# Patient Record
Sex: Male | Born: 1980 | Race: Black or African American | Hispanic: No | Marital: Married | Smoking: Never smoker
Health system: Southern US, Community
[De-identification: ages and names within clinical notes are randomized; demographics above are authoritative.]

## PROBLEM LIST (undated history)

## (undated) DIAGNOSIS — F32A Depression, unspecified: Secondary | ICD-10-CM

## (undated) DIAGNOSIS — Z91148 Patient's other noncompliance with medication regimen for other reason: Secondary | ICD-10-CM

## (undated) DIAGNOSIS — G4730 Sleep apnea, unspecified: Secondary | ICD-10-CM

## (undated) DIAGNOSIS — K219 Gastro-esophageal reflux disease without esophagitis: Secondary | ICD-10-CM

## (undated) DIAGNOSIS — E669 Obesity, unspecified: Secondary | ICD-10-CM

## (undated) DIAGNOSIS — I10 Essential (primary) hypertension: Secondary | ICD-10-CM

## (undated) DIAGNOSIS — R002 Palpitations: Secondary | ICD-10-CM

## (undated) DIAGNOSIS — I519 Heart disease, unspecified: Secondary | ICD-10-CM

## (undated) DIAGNOSIS — I428 Other cardiomyopathies: Secondary | ICD-10-CM

## (undated) DIAGNOSIS — N289 Disorder of kidney and ureter, unspecified: Secondary | ICD-10-CM

## (undated) DIAGNOSIS — I119 Hypertensive heart disease without heart failure: Secondary | ICD-10-CM

## (undated) DIAGNOSIS — K5900 Constipation, unspecified: Secondary | ICD-10-CM

## (undated) DIAGNOSIS — I5032 Chronic diastolic (congestive) heart failure: Secondary | ICD-10-CM

## (undated) DIAGNOSIS — R0602 Shortness of breath: Secondary | ICD-10-CM

## (undated) DIAGNOSIS — Z9114 Patient's other noncompliance with medication regimen: Secondary | ICD-10-CM

## (undated) DIAGNOSIS — F419 Anxiety disorder, unspecified: Secondary | ICD-10-CM

## (undated) DIAGNOSIS — I639 Cerebral infarction, unspecified: Secondary | ICD-10-CM

## (undated) DIAGNOSIS — R079 Chest pain, unspecified: Secondary | ICD-10-CM

## (undated) DIAGNOSIS — N183 Chronic kidney disease, stage 3 unspecified: Secondary | ICD-10-CM

## (undated) DIAGNOSIS — E785 Hyperlipidemia, unspecified: Secondary | ICD-10-CM

## (undated) DIAGNOSIS — E739 Lactose intolerance, unspecified: Secondary | ICD-10-CM

## (undated) HISTORY — DX: Chronic kidney disease, stage 3 unspecified: N18.30

## (undated) HISTORY — DX: Cerebral infarction, unspecified: I63.9

## (undated) HISTORY — DX: Other cardiomyopathies: I42.8

## (undated) HISTORY — DX: Gastro-esophageal reflux disease without esophagitis: K21.9

## (undated) HISTORY — PX: NO PAST SURGERIES: SHX2092

## (undated) HISTORY — DX: Depression, unspecified: F32.A

## (undated) HISTORY — DX: Chest pain, unspecified: R07.9

## (undated) HISTORY — DX: Anxiety disorder, unspecified: F41.9

## (undated) HISTORY — DX: Lactose intolerance, unspecified: E73.9

## (undated) HISTORY — DX: Constipation, unspecified: K59.00

## (undated) HISTORY — DX: Sleep apnea, unspecified: G47.30

## (undated) HISTORY — DX: Palpitations: R00.2

## (undated) HISTORY — DX: Shortness of breath: R06.02

## (undated) HISTORY — DX: Hyperlipidemia, unspecified: E78.5

---

## 2014-06-11 ENCOUNTER — Encounter: Payer: Self-pay | Admitting: Internal Medicine

## 2014-06-11 ENCOUNTER — Inpatient Hospital Stay
Admission: EM | Admit: 2014-06-11 | DRG: 305 | Disposition: A | Discharge status: Home / Self Care | Payer: BLUE CROSS/BLUE SHIELD | Attending: Internal Medicine | Admitting: Internal Medicine

## 2014-06-11 DIAGNOSIS — Z8249 Family history of ischemic heart disease and other diseases of the circulatory system: Secondary | ICD-10-CM

## 2014-06-11 DIAGNOSIS — I10 Essential (primary) hypertension: Principal | ICD-10-CM | POA: Diagnosis present

## 2014-06-11 DIAGNOSIS — E785 Hyperlipidemia, unspecified: Secondary | ICD-10-CM | POA: Diagnosis present

## 2014-06-11 DIAGNOSIS — R51 Headache: Secondary | ICD-10-CM | POA: Diagnosis present

## 2014-06-11 DIAGNOSIS — E119 Type 2 diabetes mellitus without complications: Secondary | ICD-10-CM | POA: Diagnosis present

## 2014-06-11 DIAGNOSIS — N179 Acute kidney failure, unspecified: Secondary | ICD-10-CM | POA: Diagnosis present

## 2014-06-11 DIAGNOSIS — G4733 Obstructive sleep apnea (adult) (pediatric): Secondary | ICD-10-CM | POA: Diagnosis present

## 2014-06-11 HISTORY — DX: Essential (primary) hypertension: I10

## 2014-06-11 LAB — COMPREHENSIVE METABOLIC PANEL
Albumin: 3.8 g/dL
Alkaline Phosphatase: 67 U/L
Anion Gap: 8 (ref 7–16)
BUN: 24 mg/dL — ABNORMAL HIGH
Bilirubin,Total: 0.4 mg/dL
Calcium, Total: 9 mg/dL
Chloride: 104 mmol/L
Co2: 25 mmol/L
Creatinine: 1.48 mg/dL — ABNORMAL HIGH
EGFR (African American): >60
EGFR (Non-African Amer.): >60
Glucose: 143 mg/dL — ABNORMAL HIGH
Potassium: 3.4 mmol/L — ABNORMAL LOW
SGOT(AST): 21 U/L
SGPT (ALT): 23 U/L
Sodium: 137 mmol/L
Total Protein: 7.6 g/dL

## 2014-06-11 LAB — LIPID PANEL
Cholesterol: 361 mg/dL — ABNORMAL HIGH
HDL Cholesterol: 41 mg/dL
Ldl Cholesterol, Calc: 271 mg/dL — ABNORMAL HIGH
Triglycerides: 246 mg/dL — ABNORMAL HIGH
VLDL Cholesterol, Calc: 49 mg/dL — ABNORMAL HIGH

## 2014-06-11 LAB — DRUG SCREEN, URINE
Amphetamines, Ur Screen: NEGATIVE
Barbiturates, Ur Screen: NEGATIVE
Benzodiazepine, Ur Scrn: NEGATIVE
Cannabinoid 50 Ng, Ur ~~LOC~~: NEGATIVE
Cocaine Metabolite,Ur ~~LOC~~: NEGATIVE
MDMA (Ecstasy)Ur Screen: NEGATIVE
Methadone, Ur Screen: NEGATIVE
Opiate, Ur Screen: NEGATIVE
Phencyclidine (PCP) Ur S: NEGATIVE
Tricyclic, Ur Screen: NEGATIVE

## 2014-06-11 LAB — CBC
HCT: 42 % (ref 40.0–52.0)
HGB: 14.1 g/dL (ref 13.0–18.0)
MCH: 26.7 pg (ref 26.0–34.0)
MCHC: 33.5 g/dL (ref 32.0–36.0)
MCV: 80 fL (ref 80–100)
Platelet: 326 10*3/uL (ref 150–440)
RBC: 5.27 10*6/uL (ref 4.40–5.90)
RDW: 14.9 % — ABNORMAL HIGH (ref 11.5–14.5)
WBC: 9.1 10*3/uL (ref 3.8–10.6)

## 2014-06-11 LAB — PRO B NATRIURETIC PEPTIDE: B-Type Natriuretic Peptide: 138 pg/mL — ABNORMAL HIGH

## 2014-06-11 LAB — CK TOTAL AND CKMB (NOT AT ARMC)
CK, Total: 134 U/L
CK, Total: 109 U/L
CK, Total: 102 U/L
CK-MB: 3.5 ng/mL
CK-MB: 3.2 ng/mL
CK-MB: 3.2 ng/mL

## 2014-06-11 LAB — TROPONIN I
Troponin-I: 0.04 ng/mL — ABNORMAL HIGH
Troponin-I: 0.06 ng/mL — ABNORMAL HIGH
Troponin-I: 0.05 ng/mL — ABNORMAL HIGH

## 2014-06-11 IMAGING — CR DG CHEST 1V PORT
1 series · 1 of 1 positions shown · non-contrast
Comparison: None.

CLINICAL DATA: Shortness of breath and hypertension

EXAM:
PORTABLE CHEST - 1 VIEW

[ap]
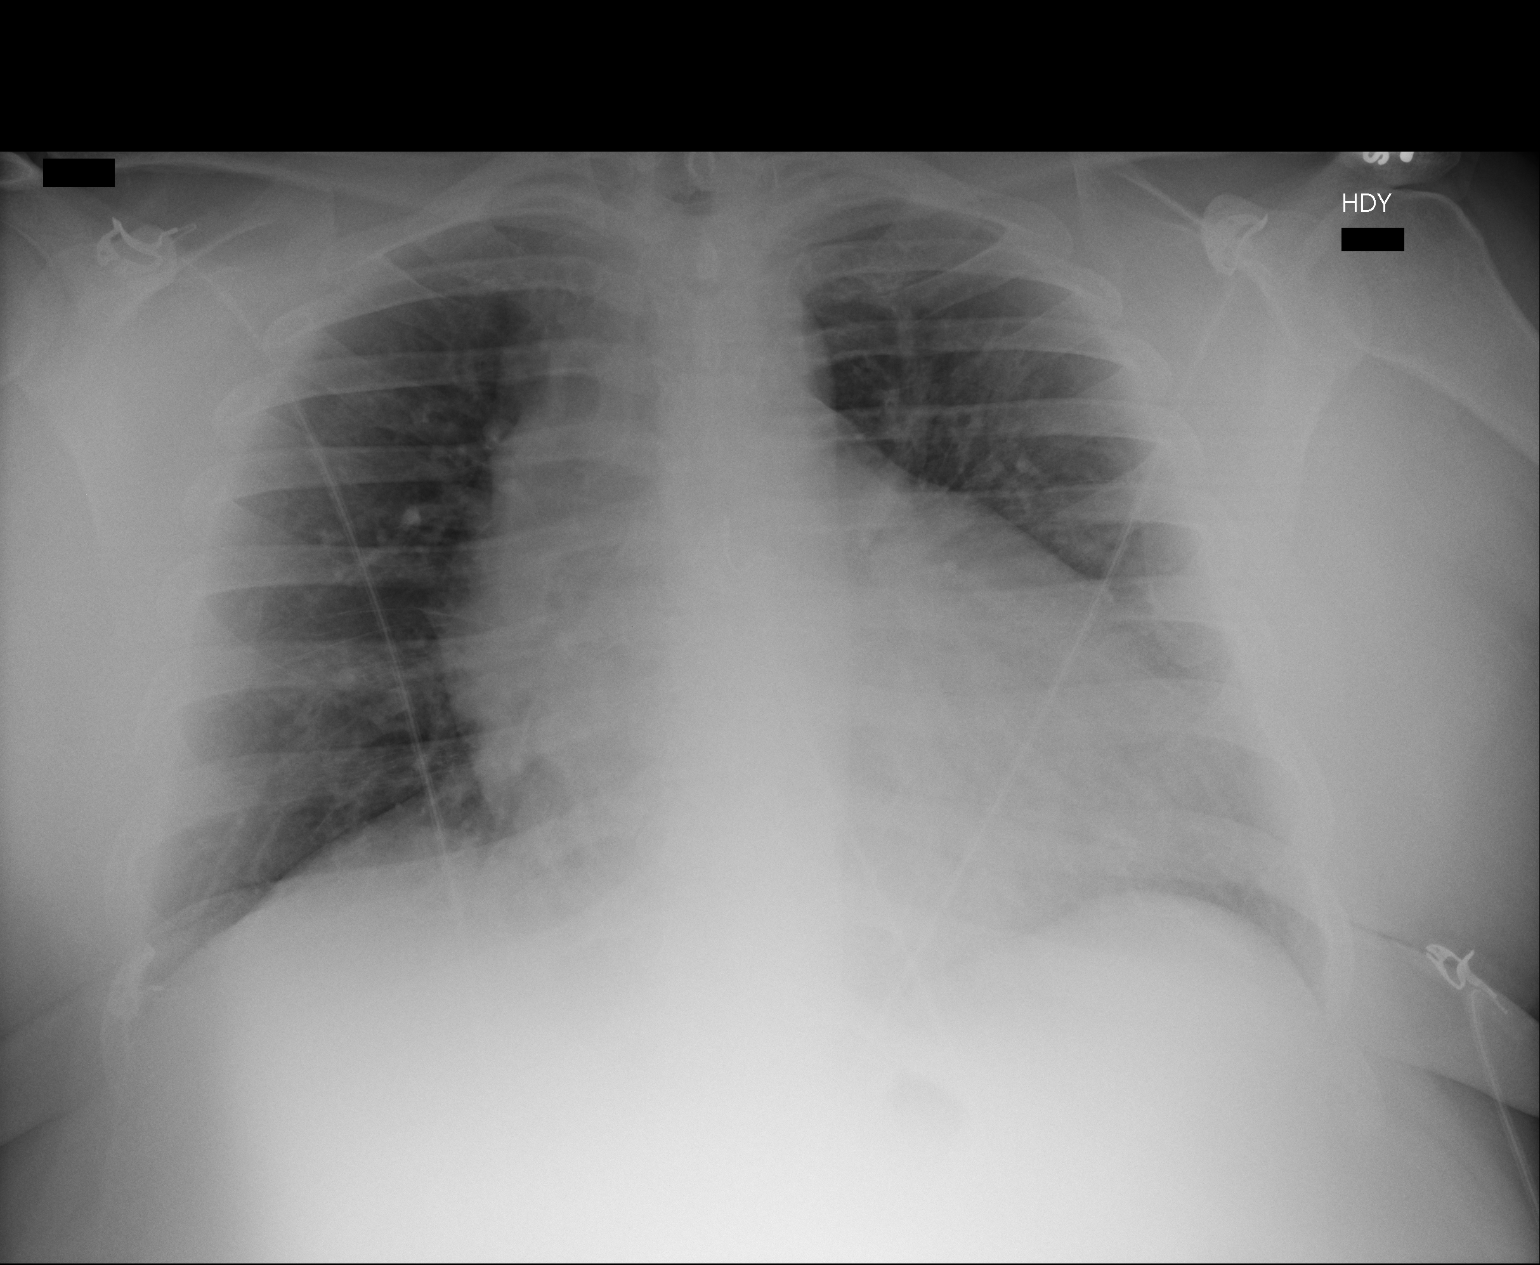

[1 of 1 positions shown; findings below may reference images not displayed]

FINDINGS: The lungs are clear. The heart is enlarged with pulmonary
vascularity within normal limits. No adenopathy appreciable. No bone
lesions.
IMPRESSION: Cardiomegaly.  No edema or consolidation.

## 2014-06-11 IMAGING — CT CT HEAD WITHOUT CONTRAST
1 of 2 series · 13 of 30 positions shown, 17 images · non-contrast
Comparison: None.

CLINICAL DATA: Progressive vision loss right eye. Hypertension.
Headaches.

EXAM:
CT HEAD WITHOUT CONTRAST
TECHNIQUE: Contiguous axial images were obtained from the base of the skull
through the vertex without intravenous contrast.

[Series 2: head wo · axial · 0.40mm/px · z∈[+1052,+1191]mm · 13 of 36 slices shown, 17 images]
[im 3/36  brain]
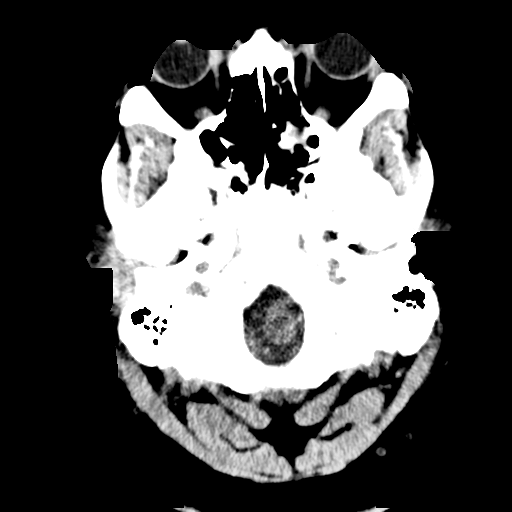
[im 3/36  bone]
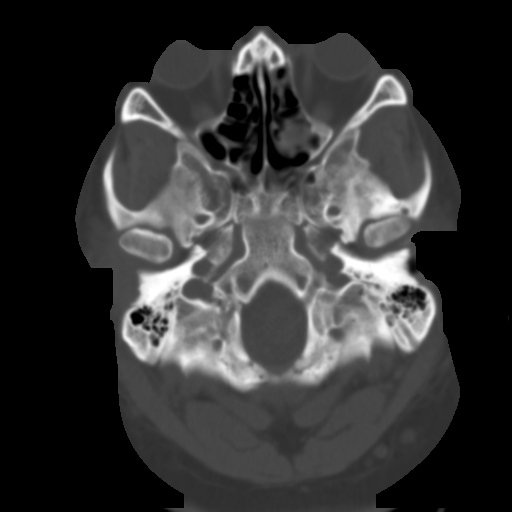
[im 6/36  brain]
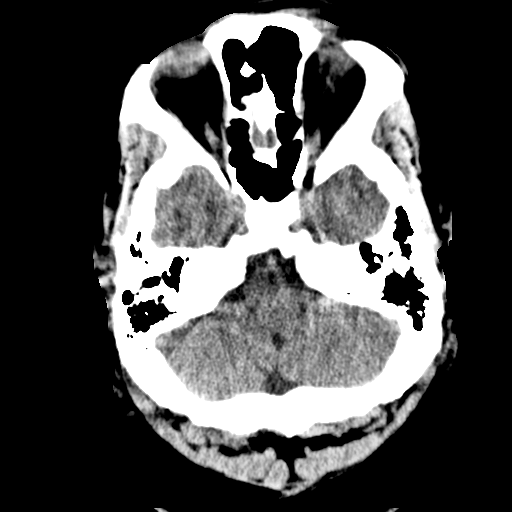
[im 8/36  brain]
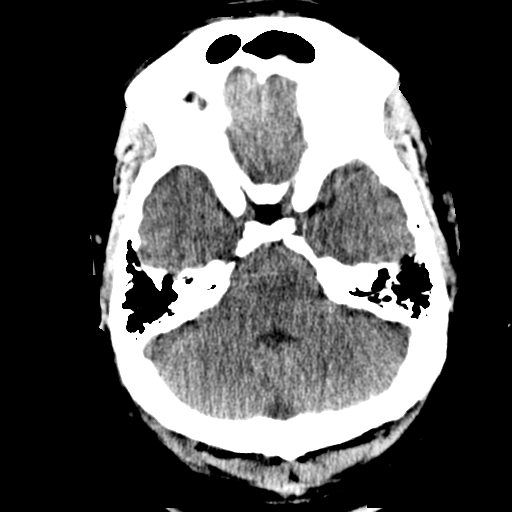
[im 11/36  brain]
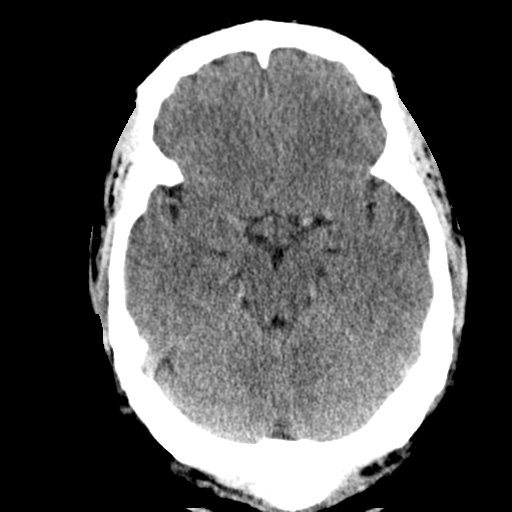
[im 13/36  brain]
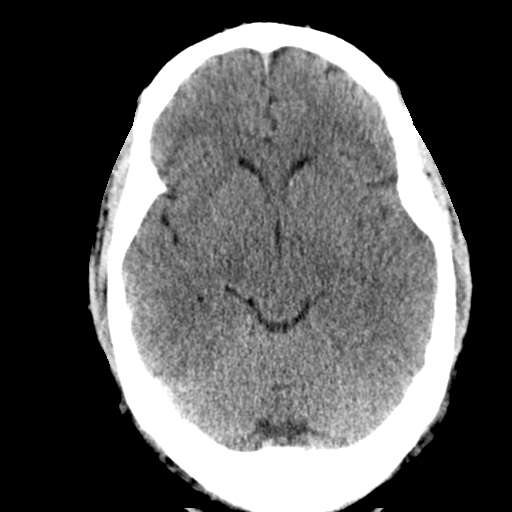
[im 13/36  bone]
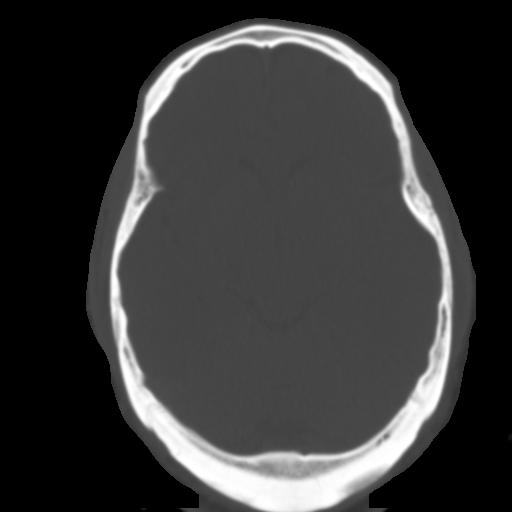
[im 16/36  brain]
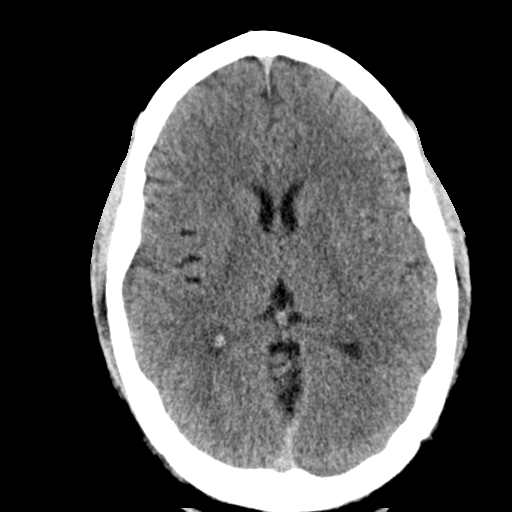
[im 18/36  brain]
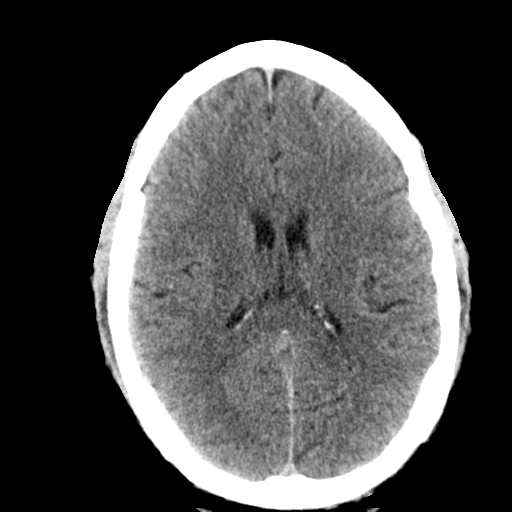
[im 21/36  brain]
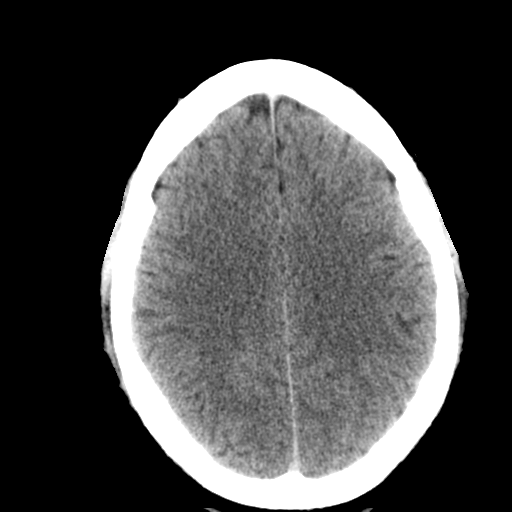
[im 23/36  brain]
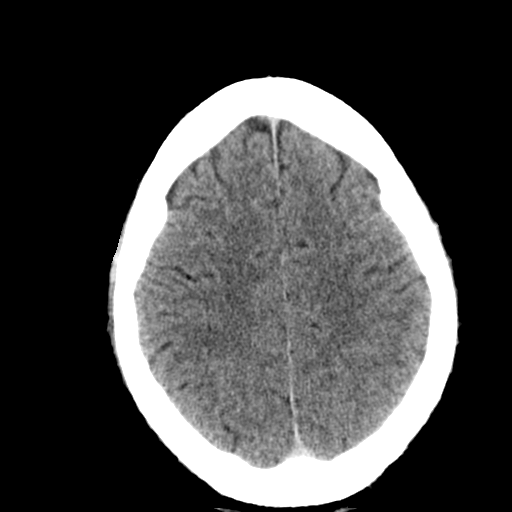
[im 23/36  bone]
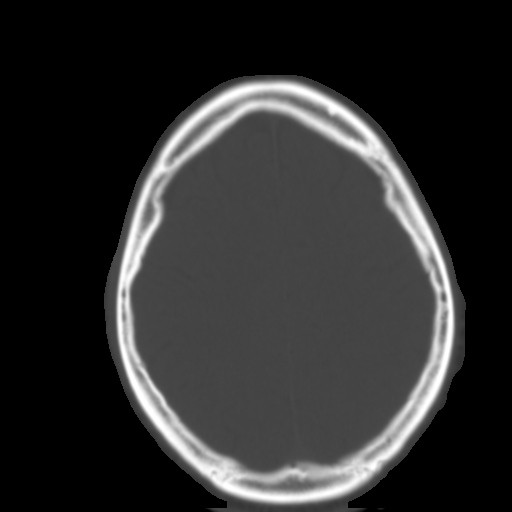
[im 26/36  brain]
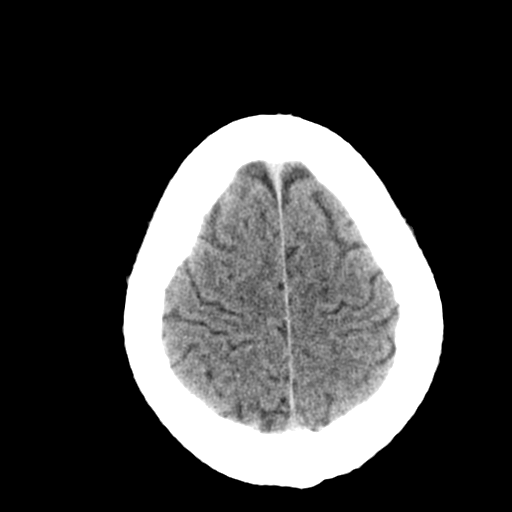
[im 28/36  brain]
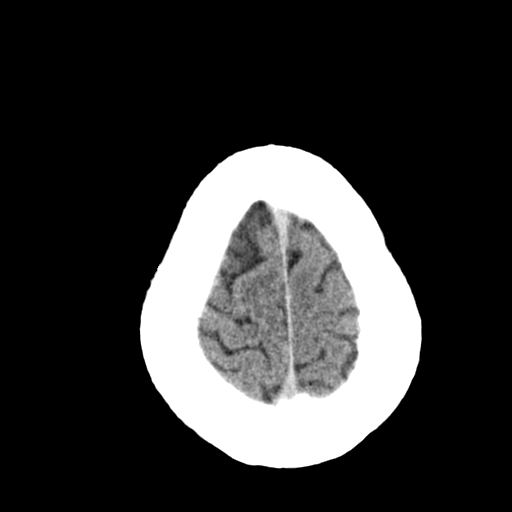
[im 31/36  brain]
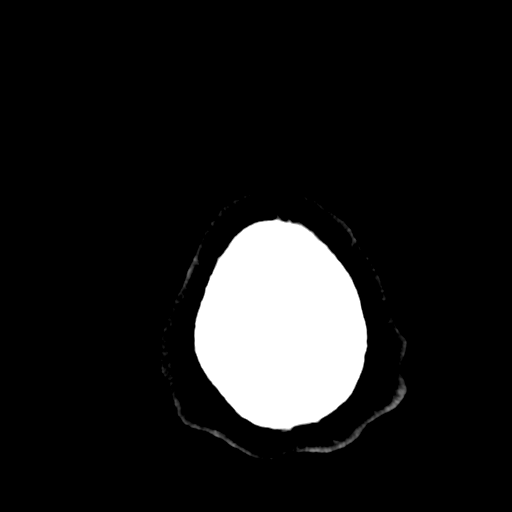
[im 33/36  brain]
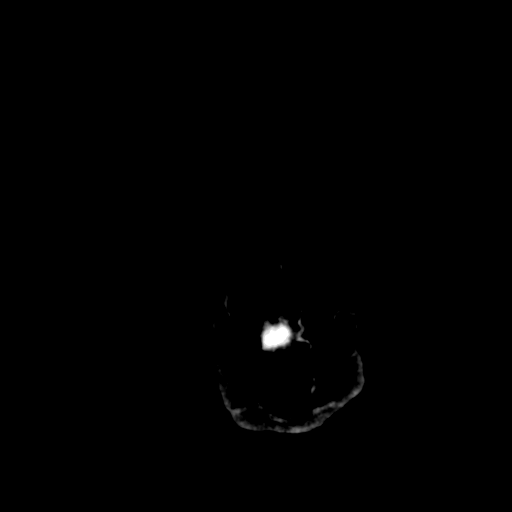
[im 33/36  bone]
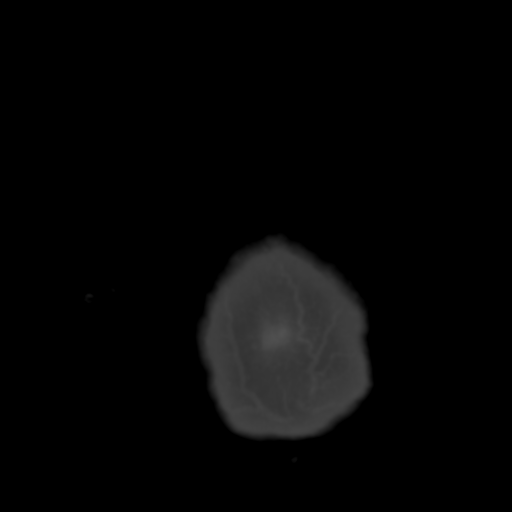

[13 of 30 positions shown; findings below may reference images not displayed]

FINDINGS: The ventricles are normal in size and configuration. There is no
intracranial mass, hemorrhage, extra-axial fluid collection, or
midline shift. Gray-white compartments appear normal. No acute
infarct apparent. Orbits appear symmetric and unremarkable
bilaterally. Bony calvarium appears intact. The mastoid air cells
are clear.
IMPRESSION: Study within normal limits.

## 2014-06-11 MED ORDER — HEPARIN SODIUM (PORCINE) 5000 UNIT/ML IJ SOLN
5000.0000 [IU] | Freq: Three times a day (TID) | INTRAMUSCULAR | Status: DC
  Administered 2014-06-12 – 2014-06-13 (×4): 5000 [IU] via SUBCUTANEOUS
  Filled 2014-06-11 (×3): qty 1

## 2014-06-11 MED ORDER — SODIUM CHLORIDE 0.9 % IJ SOLN
3.0000 mL | Freq: Four times a day (QID) | INTRAMUSCULAR | Status: DC
  Administered 2014-06-12 – 2014-06-13 (×5): 3 mL via INTRAVENOUS

## 2014-06-11 MED ORDER — HYDRALAZINE HCL 20 MG/ML IJ SOLN
10.0000 mg | Freq: Four times a day (QID) | INTRAMUSCULAR | Status: DC | PRN
  Administered 2014-06-12: 14:00:00 10 mg via INTRAVENOUS
  Filled 2014-06-11: qty 1

## 2014-06-11 MED ORDER — DOCUSATE SODIUM 100 MG PO CAPS: 100.0000 mg | ORAL_CAPSULE | Freq: Two times a day (BID) | ORAL | Status: DC | PRN

## 2014-06-11 MED ORDER — LISINOPRIL 20 MG PO TABS
20.0000 mg | ORAL_TABLET | Freq: Every day | ORAL | Status: DC
  Administered 2014-06-12: 20 mg via ORAL
  Filled 2014-06-11: qty 1

## 2014-06-11 MED ORDER — INSULIN ASPART 100 UNIT/ML ~~LOC~~ SOLN
0.0000 [IU] | Freq: Three times a day (TID) | SUBCUTANEOUS | Status: DC
  Administered 2014-06-12 – 2014-06-13 (×2): 2 [IU] via SUBCUTANEOUS
  Filled 2014-06-11 (×3): qty 2

## 2014-06-11 MED ORDER — NITROGLYCERIN IN D5W 200-5 MCG/ML-% IV SOLN
0.0000 ug/min | INTRAVENOUS | Status: DC
Start: 2014-06-12 — End: 2014-06-12
  Administered 2014-06-12: 5 ug/min via INTRAVENOUS

## 2014-06-11 MED ORDER — NITROGLYCERIN 2 % TD OINT: 1.0000 [in_us] | TOPICAL_OINTMENT | TRANSDERMAL | Status: DC

## 2014-06-11 MED ORDER — ASPIRIN 81 MG PO CHEW
81.0000 mg | CHEWABLE_TABLET | Freq: Every day | ORAL | Status: DC
  Administered 2014-06-12: 81 mg via ORAL
  Filled 2014-06-11: qty 1

## 2014-06-11 MED ORDER — HYDROCODONE-ACETAMINOPHEN 5-325 MG PO TABS
1.0000 | ORAL_TABLET | ORAL | Status: DC | PRN
  Administered 2014-06-12 (×2): 1 via ORAL
  Filled 2014-06-11 (×2): qty 1

## 2014-06-11 MED ORDER — SENNA 8.6 MG PO TABS: 1.0000 | ORAL_TABLET | Freq: Two times a day (BID) | ORAL | Status: DC | PRN

## 2014-06-11 MED ORDER — METOPROLOL TARTRATE 50 MG PO TABS
50.0000 mg | ORAL_TABLET | Freq: Two times a day (BID) | ORAL | Status: DC
  Administered 2014-06-12 (×2): 50 mg via ORAL
  Filled 2014-06-11 (×2): qty 1

## 2014-06-11 MED ORDER — ACETAMINOPHEN 325 MG PO TABS
650.0000 mg | ORAL_TABLET | ORAL | Status: DC | PRN
  Administered 2014-06-12: 17:00:00 650 mg via ORAL
  Filled 2014-06-11: qty 2

## 2014-06-11 MED ORDER — GLIPIZIDE 10 MG PO TABS
10.0000 mg | ORAL_TABLET | Freq: Every day | ORAL | Status: DC
  Administered 2014-06-12 – 2014-06-13 (×2): 10 mg via ORAL
  Filled 2014-06-11: qty 2
  Filled 2014-06-11: qty 1

## 2014-06-11 MED ORDER — HYDROCHLOROTHIAZIDE 12.5 MG PO CAPS
12.5000 mg | ORAL_CAPSULE | Freq: Every day | ORAL | Status: DC
  Administered 2014-06-12: 12.5 mg via ORAL
  Filled 2014-06-11: qty 1

## 2014-06-11 MED ORDER — SODIUM CHLORIDE 0.9 % IJ SOLN
3.0000 mL | INTRAMUSCULAR | Status: DC | PRN
Start: 2014-06-12 — End: 2014-06-13
  Administered 2014-06-12: 6 mL via INTRAVENOUS
  Filled 2014-06-11: qty 10

## 2014-06-11 MED ORDER — ONDANSETRON HCL 4 MG/2ML IJ SOLN: 4.0000 mg | INTRAMUSCULAR | Status: DC | PRN

## 2014-06-12 ENCOUNTER — Encounter: Payer: Self-pay | Admitting: Internal Medicine

## 2014-06-12 ENCOUNTER — Other Ambulatory Visit: Payer: Self-pay | Admitting: Cardiology

## 2014-06-12 ENCOUNTER — Inpatient Hospital Stay: Payer: BLUE CROSS/BLUE SHIELD

## 2014-06-12 LAB — BASIC METABOLIC PANEL
Anion gap: 10 (ref 5–15)
BUN: 23 mg/dL — ABNORMAL HIGH (ref 6–20)
CO2: 26 mmol/L (ref 22–32)
Calcium: 8.9 mg/dL (ref 8.9–10.3)
Chloride: 100 mmol/L — ABNORMAL LOW (ref 101–111)
Creatinine, Ser: 1.29 mg/dL — ABNORMAL HIGH (ref 0.61–1.24)
GFR calc Af Amer: >60 mL/min (ref >60)
GFR calc non Af Amer: >60 mL/min (ref >60)
Glucose, Bld: 161 mg/dL — ABNORMAL HIGH (ref 65–99)
Potassium: 3.2 mmol/L — ABNORMAL LOW (ref 3.5–5.1)
Sodium: 136 mmol/L (ref 135–145)

## 2014-06-12 LAB — GLUCOSE, CAPILLARY
Glucose-Capillary: 160 mg/dL — ABNORMAL HIGH (ref 70–99)
Glucose-Capillary: 113 mg/dL — ABNORMAL HIGH (ref 70–99)
Glucose-Capillary: 131 mg/dL — ABNORMAL HIGH (ref 70–99)
Glucose-Capillary: 147 mg/dL — ABNORMAL HIGH (ref 70–99)
Glucose-Capillary: 125 mg/dL — ABNORMAL HIGH (ref 70–99)

## 2014-06-12 MED ORDER — PNEUMOCOCCAL 13-VAL CONJ VACC IM SUSP
0.5000 mL | INTRAMUSCULAR | Status: DC
  Filled 2014-06-12 (×2): qty 0.5

## 2014-06-12 MED ORDER — POTASSIUM CHLORIDE CRYS ER 20 MEQ PO TBCR
40.0000 meq | EXTENDED_RELEASE_TABLET | Freq: Once | ORAL | Status: AC
  Administered 2014-06-12: 40 meq via ORAL
  Filled 2014-06-12: qty 2

## 2014-06-12 NOTE — Plan of Care (Signed)
Problem: Discharge Progression Outcomes Goal: Discharge plan in place and appropriate Individualization- Pt took himself off BP meds Goal: Other Discharge Outcomes/Goals Outcome: Progressing Pt required hydralazine IV x 1 for DBP 101 which was effective. Tylenol effective for mild HA x 1. No complications this shift. Tolerating 1800 diet well; providing additional diet beverages. Up in room ad lib; tolerated well.

## 2014-06-12 NOTE — Progress Notes (Signed)
Pt rested comfortably throughout night, still has HA from NTG drip. Pt was unable to maintain dystolic most of night below 90 as per order. No other issues noted this shift.

## 2014-06-12 NOTE — Progress Notes (Signed)
BMP results reviewed. Return TC page from Dr. Allena Katz advised K+ 3.2 with new orders obtained.

## 2014-06-12 NOTE — Progress Notes (Signed)
All documentation overdue was done in [facility]  Mr [id number]

## 2014-06-12 NOTE — Progress Notes (Signed)
Pt transferred to 1C, rm 116, report called to [employer].  All belongings sent with pt.  No c/o pain or SOB.  Nitro drip stopped at beginning of shift.  BP has remained stable since

## 2014-06-12 NOTE — Progress Notes (Signed)
Pt transferred from CCU. Denies co's/concerns. Required Hydralazine IV x 1 for elevated BP with desired response. Compliant with diabetic diet. FSBS's controlled.

## 2014-06-12 NOTE — Progress Notes (Signed)
Subjective: 34 yo admitted for HTN emergency. Symptoms better today. Blurred vision in right eye improved but has been going on for 2 wks. No chest pain or SOB.  Objective: Vital signs in last 24 hours: Temp:  [98.5 F (36.9 C)] 98.5 F (36.9 C) (05/01 0700) Pulse Rate:  [100] 100 (05/01 0800) Resp:  [18-21] 21 (05/01 0800) BP: (130-141)/(83-86) 130/83 mmHg (05/01 0800) SpO2:  [95 %] 95 % (05/01 0700) Weight:  [130.636 kg (288 lb)] 130.636 kg (288 lb) (04/30 1755) Weight change:     Intake/Output from previous day: 04/30 0701 - 05/01 0700 In: -  Out: 700 [Urine:700] Intake/Output this shift:    General appearance: alert and no distress Head: Normocephalic, without obvious abnormality Eyes: conjunctivae/corneas clear. PERRL, EOM's intact. Fundi benign. Throat: lips, mucosa, and tongue normal; teeth and gums normal Resp: clear to auscultation bilaterally and normal percussion bilaterally Cardio: regular rate and rhythm, S1, S2 normal, no murmur, click, rub or gallop GI: soft, non-tender; bowel sounds normal; no masses,  no organomegaly Pulses: 2+ and symmetric Skin: Skin color, texture, turgor normal. No rashes or lesions Neurologic: Cranial nerves: 2-12 grossly intact Sensory: normal, No sensory def bilaterally Motor: No weakness bilatterally  Lab Results:  Recent Labs  06/11/14 1323  WBC 9.1  HGB 14.1  HCT 42.0  PLT 326   BMET  Recent Labs  06/11/14 1323  CO2 25  BUN 24*  CREATININE 1.48*  CALCIUM 9.0    Studies/Results: Dg Chest Port 1 View  06/11/2014   CLINICAL DATA:  Shortness of breath and hypertension  EXAM: PORTABLE CHEST - 1 VIEW  COMPARISON:  None.  FINDINGS: The lungs are clear. The heart is enlarged with pulmonary vascularity within normal limits. No adenopathy appreciable. No bone lesions.  IMPRESSION: Cardiomegaly.  No edema or consolidation.   Electronically Signed   By: Bretta Bang III M.D.   On: 06/11/2014 15:31   Ct Head Limited W/o  Cm  06/11/2014   CLINICAL DATA:  Progressive vision loss right eye. Hypertension. Headaches.  EXAM: CT HEAD WITHOUT CONTRAST  TECHNIQUE: Contiguous axial images were obtained from the base of the skull through the vertex without intravenous contrast.  COMPARISON:  None.  FINDINGS: The ventricles are normal in size and configuration. There is no intracranial mass, hemorrhage, extra-axial fluid collection, or midline shift. Gray-white compartments appear normal. No acute infarct apparent. Orbits appear symmetric and unremarkable bilaterally. Bony calvarium appears intact. The mastoid air cells are clear.  IMPRESSION: Study within normal limits.   Electronically Signed   By: Bretta Bang III M.D.   On: 06/11/2014 14:29    Medications:  Scheduled: . aspirin  81 mg Oral Daily  . glipiZIDE  10 mg Oral QAC breakfast  . heparin subcutaneous  5,000 Units Subcutaneous 3 times per day  . hydrochlorothiazide  12.5 mg Oral Daily  . insulin aspart  0-10 Units Subcutaneous TID WC & HS  . lisinopril  20 mg Oral Daily  . metoprolol tartrate  50 mg Oral BID  . sodium chloride  3 mL Intravenous 4 times per day   Continuous:  MEQ:[id number], docusate sodium, hydrALAZINE, HYDROcodone-acetaminophen, ondansetron (ZOFRAN) IV, senna, sodium chloride  Assessment/Plan: 1. Hypertensive Emergency: BP much better. Symptoms improving. Will wean pt off nitro ggt and transfer out of ICU. Echo pending to asses for potential heart damage from uncontrolled HTN.  2. Acute Renal Failure: Morning Cr pending. Given IVF overnight to see if mild dehydration was contributing. If Cr not  trending down then will consult nephrology for further workup.   3. Type 2 DM: Started on Glipizide. Hgb-A1C pending. No metformin secondary to renal failure. Will need f/u as outpt.  4. Mildly Elevated Trop. Peaked at 0.06. No cardiac symptoms. Suspect sec to HTN. Echo pending.  5. Hyperlipidemia: Started on statin.  LOS: 1 day    Gracelyn Nurse 06/12/2014, 8:22 AM

## 2014-06-13 ENCOUNTER — Other Ambulatory Visit: Payer: Self-pay | Admitting: Internal Medicine

## 2014-06-13 DIAGNOSIS — I161 Hypertensive emergency: Secondary | ICD-10-CM | POA: Diagnosis present

## 2014-06-13 LAB — GLUCOSE, CAPILLARY: Glucose-Capillary: 191 mg/dL — ABNORMAL HIGH (ref 70–99)

## 2014-06-13 LAB — POTASSIUM: Potassium: 3.3 mmol/L — ABNORMAL LOW (ref 3.5–5.1)

## 2014-06-13 MED ORDER — LISINOPRIL 40 MG PO TABS: 40.0000 mg | ORAL_TABLET | Freq: Every day | ORAL | Status: DC

## 2014-06-13 MED ORDER — GLIPIZIDE 10 MG PO TABS: 10.0000 mg | ORAL_TABLET | Freq: Every day | ORAL | Status: DC

## 2014-06-13 MED ORDER — HYDROCHLOROTHIAZIDE 12.5 MG PO CAPS: 12.5000 mg | ORAL_CAPSULE | Freq: Every day | ORAL | Status: DC

## 2014-06-13 MED ORDER — METOPROLOL TARTRATE 50 MG PO TABS: 50.0000 mg | ORAL_TABLET | Freq: Two times a day (BID) | ORAL | Status: DC

## 2014-06-13 NOTE — H&P (Addendum)
PATIENT NAME:  Joel [name], Joel [name] MR#:  [id number] DATE OF BIRTH:  [birth year]  DATE OF ADMISSION:  06/11/2014  PRIMARY CARE PHYSICIAN: [facility] [place], [place]  CHIEF COMPLAINT: Elevated blood pressure, right eye blurred vision.   Joel [name] is a 34 year old African-American gentleman with past medical history of hypertension, diabetes, and hyperlipidemia who took himself off medication since he was feeling better and comes to the Emergency Room after he started having on and off chest tightness, chest soreness for 1-1/2 months. He started noticing some blurred vision in his right eye about 4 or 5 days ago, went to see an optometrist in [facility], who took his blood pressure. It was found to be very high. The patient was asked to make appointment with a primary care physician and follow up with ophthalmologist. Today at work, he was not feeling well and blood pressure was taken and his blood pressure was around 250/146. He came to the Emergency Room. His initial blood pressure was 246/143. He received 2 doses of IV labetalol and nitroglycerin paste. He is not having any chest pain at present, and his vision has mild blurriness on the right eye. He is being admitted for hypertensive urgency, for further evaluation and management.   PAST MEDICAL HISTORY:  1. Hypertension.  2. Hyperlipidemia.  3. Type 2 diabetes.  4. Obstructive sleep apnea, does not use CPAP.   FAMILY HISTORY: Positive for hypertension and heart disease.   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 Nonsmoker, nonalcoholic. Denies any other drug use.  MEDICATIONS: None. The patient used to be on hydrochlorothiazide, lisinopril and metformin along with Simvastatin for his chronic medical issues. However, he took himself off for about a year.  REVIEW OF SYSTEMS: .  CONSTITUTIONAL: No fever. No fatigue, weakness.  EYES: Positive for blurred vision in the right eye. No glaucoma or  cataract. ENT: No tinnitus, ear pain, hearing loss. RESPIRATORY: No cough, wheeze, hemoptysis, or dyspnea.  CARDIOVASCULAR: Positive for chest tightness on and off. Positive for hypertension. No dyspnea on exertion or palpitations.  GASTROINTESTINAL: No nausea, vomiting, diarrhea abdominal pain or GERD.  GENITOURINARY: No dysuria, hematuria, or frequency.  ENDOCRINE: No polyuria, nocturia or thyroid problems.  HEMATOLOGY: No anemia, easy bruising or bleeding.  SKIN: No acne, rash, or lesion.  MUSCULOSKELETAL: No arthritis, cramps, swelling or gout.  NEUROLOGIC: No CVA, TIA, dementia or seizures.  PSYCHIATRIC: No anxiety, depression, or bipolar disorder. All other systems reviewed and negative.   PHYSICAL EXAMINATION:  GENERAL: The patient is awake, alert, oriented x3, not in acute distress. His current blood pressure is 198/146. Saturations are 97% on room air. Pulse is 112. Temperature is 98.4.  HEENT: Atraumatic, normocephalic. Pupils PERRLA. EOM intact. Oral mucosa is moist. NECK: Supple. No JVD. No carotid bruit.  RESPIRATORY: Clear to auscultation bilaterally.  HEART: Both rales, rhonchi, respiratory distress or labored breathing.  CARDIOVASCULAR: Both the heard sounds are normal. Rate and rhythm regular. PMI not lateralized. Chest is nontender.  EXTREMITIES: Good pedal pulses, good femoral pulses. No lower extremity edema.  ABDOMEN: Soft, benign, nontender. No organomegaly. Positive bowel sounds.  NEUROLOGIC: Grossly intact cranial nerves II through XII. No motor or sensory deficits.  PSYCHIATRIC: The patient is awake, alert, oriented x3.  SKIN: Warm and dry.  EKG shows normal sinus rhythm with possible left atrial enlargement, possible T-wave abnormality, consider lateral ischemia.  CT of the head shows no evidence of CVA.   Chest x-ray  shows cardiomegaly, no edema or consultation.  CK total, CK-MB within normal limits. Troponin is 0.04. Glucose is 143. BUN 24, creatinine is  1.48, sodium is 137, potassium is 3.4. CBC within normal limits. B-type natriuretic peptide is 138.   ASSESSMENT AND PLAN: A 34 year old, Joel [name], with history of hypertension, diabetes, hyperlipidemia, not on any medication, comes in with:  1. Hypertensive urgency with blurred vision and shortness of breath. The patient's blood pressure is improving slowly with treatment in the Emergency Room with labetalol and nitroglycerin. Will start the patient on metoprolol, hydrochlorothiazide and lisinopril, p.r.n. hydralazine for elevated blood pressure. We will check echocardiogram, lipid profile, cycle cardiac enzymes x3. Continue aspirin, nitroglycerin paste and beta blockers.  2. Type 2 diabetes. Sliding scale insulin and start glipizide XL 10 mg b.i.d. Metformin will not be started since creatinine is high.  3. Acute renal failure, appears likely underlying some chronic component. I do not have any baseline creatinine. The patient does not appear dehydrated. He has already received some IV fluids. Will continue to monitor Is and Os, avoid nephrotoxins. He is started on lisinopril for his blood pressure. Some elevated creatinine is going to be expected.  4. Hyperlipidemia. Check lipid profile. Start him on statins. He used to be on Simvastatin before. 5. Obesity with history of sleep apnea. The patient is recommended to, once he sees his primary care physician, try to get outpatient sleep study and CPAP set up at home.  6. Deep vein thrombosis prophylaxis. Subcutaneous heparin t.i.d.  7. Dietitian consult placed for instructions on diet regarding hypertension and diabetes along with hyperlipidemia. 8. The above was discussed with the patient, the patient's mother and wife who are present in the Emergency Room.  CRITICAL TIME SPENT: 55 minutes.    ____________________________ Wylie Hail Allena Katz, MD sap:jh D: 06/11/2014 15:52:39 ET T: 06/11/2014 16:17:58 ET JOB#: [id number]  cc: Laquonda Welby A. Allena Katz, MD,  <Dictator> [facility] [place] Willow Ora MD ELECTRONICALLY SIGNED 06/14/2014 11:21

## 2014-06-13 NOTE — Discharge Summary (Signed)
Physician Discharge Summary  Patient ID: REVIS BOTZ MRN: [id number] DOB/AGE: [birth year] 32 y.o.  Admit date: 06/11/2014 Discharge date: 06/13/2014  Admission Diagnoses: Hypertensive Emergency  Discharge Diagnoses:  Active Problems:   Hypertensive urgency   Discharged Condition: stable  Hospital Course: 1. HTN Urgency: Pt admitted with extremely elevated blood pressure with systolic in the 240's. Given IV labetolol and started on nitro drip. Started orally on B-block ACE and diuretic. Weaned off drips and tolerating oral meds at discharge. Will follow up with PCP for further medication adjustment.  2. Acute Renal Failure: CR 1.48. Trended down to 1.25 with fluids. With hx of HTN would recommend follow up with nephrology as out pt.   3. DM: Stopped metformin sec to renal issues. Started glipizide, will need adjustment as outpt.  4. Mild Elevated Trop: Echo shows EF of 45-50%. No further workup. On ACE and B-blocker.  Consults: None  Significant Diagnostic Studies: echo    Discharge Exam: Blood pressure 137/85, pulse 86, temperature 97.5 F (36.4 C), temperature source Oral, resp. rate 18, height 5' 7.99" (1.727 m), weight 130.636 kg (288 lb), SpO2 98 %. Cardio: regular rate and rhythm, S1, S2 normal, no murmur, click, rub or gallop  Disposition: Final discharge disposition not confirmed  Discharge Instructions    Activity as tolerated - No restrictions    Complete by:  As directed      Diet - low sodium heart healthy    Complete by:  As directed             Medication List    TAKE these medications        glipiZIDE 10 MG tablet  Commonly known as:  GLUCOTROL  Take 1 tablet (10 mg total) by mouth daily before breakfast.     hydrochlorothiazide 12.5 MG capsule  Commonly known as:  MICROZIDE  Take 1 capsule (12.5 mg total) by mouth daily.     lisinopril 40 MG tablet  Commonly known as:  PRINIVIL,ZESTRIL  Take 1 tablet (40 mg total) by mouth daily.     metoprolol 50 MG tablet  Commonly known as:  LOPRESSOR  Take 1 tablet (50 mg total) by mouth 2 (two) times daily.           Follow-up Information    Follow up with [facility] In 1 day.   Specialty:  Family Medicine   Contact information:   [address] [place] [address] [phone]       Signed: Gracelyn Nurse 06/13/2014, 9:09 AM

## 2014-06-13 NOTE — Care Management Note (Signed)
Case Management Note  Patient Details  Name: Joel [name] MRN: [id number] Date of Birth: 1981-02-09  Subjective/Objective:                   Admitted to [facility] with hypertension.  Lives with Nasario Masek [phone]) No home health. Takes care of all activities of daily living himself. Friends will transport. Action/Plan:  Follow-up appointment arranged for 06/14/14 at [facility] in [facility] Expected Discharge Date:                  Expected Discharge Plan:     In-House Referral:     Discharge planning Services     Post Acute Care Choice:    Choice offered to:     DME Arranged:    DME Agency:     HH Arranged:    HH Agency:     Status of Service:     Medicare Important Message Given:    Date Medicare IM Given:    Medicare IM give by:    Date Additional Medicare IM Given:    Additional Medicare Important Message give by:     If discussed at Long Length of Stay Meetings, dates discussed:    Additional Comments:  Gwenette Greet 06/13/2014, 10:08 AM

## 2014-06-13 NOTE — Plan of Care (Signed)
Problem: Discharge Progression Outcomes Goal: Other Discharge Outcomes/Goals Outcome: Progressing Plan of care progress to goal for:  Pain-prn meds given with improvement Hemodynamically-pt had high BP with improvement with meds Complications-pt complained of headache, prn meds given with improvement

## 2014-10-11 ENCOUNTER — Emergency Department: Payer: BLUE CROSS/BLUE SHIELD

## 2014-10-11 ENCOUNTER — Encounter: Payer: Self-pay | Admitting: Emergency Medicine

## 2014-10-11 ENCOUNTER — Inpatient Hospital Stay
Admission: EM | Admit: 2014-10-11 | Discharge: 2014-10-13 | DRG: 292 | Disposition: A | Discharge status: Home / Self Care | Payer: BLUE CROSS/BLUE SHIELD | Attending: Internal Medicine | Admitting: Internal Medicine

## 2014-10-11 DIAGNOSIS — I509 Heart failure, unspecified: Secondary | ICD-10-CM

## 2014-10-11 DIAGNOSIS — R0602 Shortness of breath: Secondary | ICD-10-CM | POA: Diagnosis present

## 2014-10-11 DIAGNOSIS — I129 Hypertensive chronic kidney disease with stage 1 through stage 4 chronic kidney disease, or unspecified chronic kidney disease: Secondary | ICD-10-CM | POA: Diagnosis present

## 2014-10-11 DIAGNOSIS — I5022 Chronic systolic (congestive) heart failure: Secondary | ICD-10-CM

## 2014-10-11 DIAGNOSIS — E119 Type 2 diabetes mellitus without complications: Secondary | ICD-10-CM

## 2014-10-11 DIAGNOSIS — I161 Hypertensive emergency: Secondary | ICD-10-CM | POA: Diagnosis present

## 2014-10-11 DIAGNOSIS — N183 Chronic kidney disease, stage 3 (moderate): Secondary | ICD-10-CM | POA: Diagnosis present

## 2014-10-11 DIAGNOSIS — I248 Other forms of acute ischemic heart disease: Secondary | ICD-10-CM | POA: Diagnosis present

## 2014-10-11 DIAGNOSIS — I5043 Acute on chronic combined systolic (congestive) and diastolic (congestive) heart failure: Principal | ICD-10-CM | POA: Diagnosis present

## 2014-10-11 DIAGNOSIS — Z9114 Patient's other noncompliance with medication regimen: Secondary | ICD-10-CM | POA: Diagnosis present

## 2014-10-11 DIAGNOSIS — E785 Hyperlipidemia, unspecified: Secondary | ICD-10-CM | POA: Diagnosis present

## 2014-10-11 DIAGNOSIS — Z79899 Other long term (current) drug therapy: Secondary | ICD-10-CM

## 2014-10-11 DIAGNOSIS — E1122 Type 2 diabetes mellitus with diabetic chronic kidney disease: Secondary | ICD-10-CM | POA: Diagnosis present

## 2014-10-11 LAB — HEPATIC FUNCTION PANEL
ALT: 58 U/L (ref 17–63)
AST: 36 U/L (ref 15–41)
Albumin: 3.2 g/dL — ABNORMAL LOW (ref 3.5–5.0)
Alkaline Phosphatase: 68 U/L (ref 38–126)
Bilirubin, Direct: 0.2 mg/dL (ref 0.1–0.5)
Indirect Bilirubin: 0.6 mg/dL (ref 0.3–0.9)
Total Bilirubin: 0.8 mg/dL (ref 0.3–1.2)
Total Protein: 7.2 g/dL (ref 6.5–8.1)

## 2014-10-11 LAB — BASIC METABOLIC PANEL
Anion gap: 10 (ref 5–15)
BUN: 28 mg/dL — ABNORMAL HIGH (ref 6–20)
CO2: 26 mmol/L (ref 22–32)
Calcium: 8.8 mg/dL — ABNORMAL LOW (ref 8.9–10.3)
Chloride: 101 mmol/L (ref 101–111)
Creatinine, Ser: 1.82 mg/dL — ABNORMAL HIGH (ref 0.61–1.24)
GFR calc Af Amer: 55 mL/min — ABNORMAL LOW (ref >60)
GFR calc non Af Amer: 48 mL/min — ABNORMAL LOW (ref >60)
Glucose, Bld: 202 mg/dL — ABNORMAL HIGH (ref 65–99)
Potassium: 3.3 mmol/L — ABNORMAL LOW (ref 3.5–5.1)
Sodium: 137 mmol/L (ref 135–145)

## 2014-10-11 LAB — CBC
HCT: 37.8 % — ABNORMAL LOW (ref 40.0–52.0)
Hemoglobin: 12.7 g/dL — ABNORMAL LOW (ref 13.0–18.0)
MCH: 25.9 pg — ABNORMAL LOW (ref 26.0–34.0)
MCHC: 33.5 g/dL (ref 32.0–36.0)
MCV: 77.4 fL — ABNORMAL LOW (ref 80.0–100.0)
Platelets: 344 10*3/uL (ref 150–440)
RBC: 4.88 MIL/uL (ref 4.40–5.90)
RDW: 15.9 % — ABNORMAL HIGH (ref 11.5–14.5)
WBC: 10 10*3/uL (ref 3.8–10.6)

## 2014-10-11 LAB — CKMB (ARMC ONLY): CK, MB: 2.2 ng/mL (ref 0.5–5.0)

## 2014-10-11 LAB — TROPONIN I: Troponin I: 0.08 ng/mL — ABNORMAL HIGH (ref <0.031)

## 2014-10-11 LAB — BRAIN NATRIURETIC PEPTIDE: B Natriuretic Peptide: 271 pg/mL — ABNORMAL HIGH (ref 0.0–100.0)

## 2014-10-11 IMAGING — CR DG CHEST 2V
1 series · 2 of 2 positions shown · non-contrast
Comparison: [DATE] portable chest.

CLINICAL DATA: Shortness of breath for 3 days. Foot swelling for 1
day. Nonsmoker. Initial encounter.

EXAM:
CHEST  2 VIEW

[Series 1: w chest pa · 0.14mm/px · 2 of 2 slices shown]
[im 1/2]
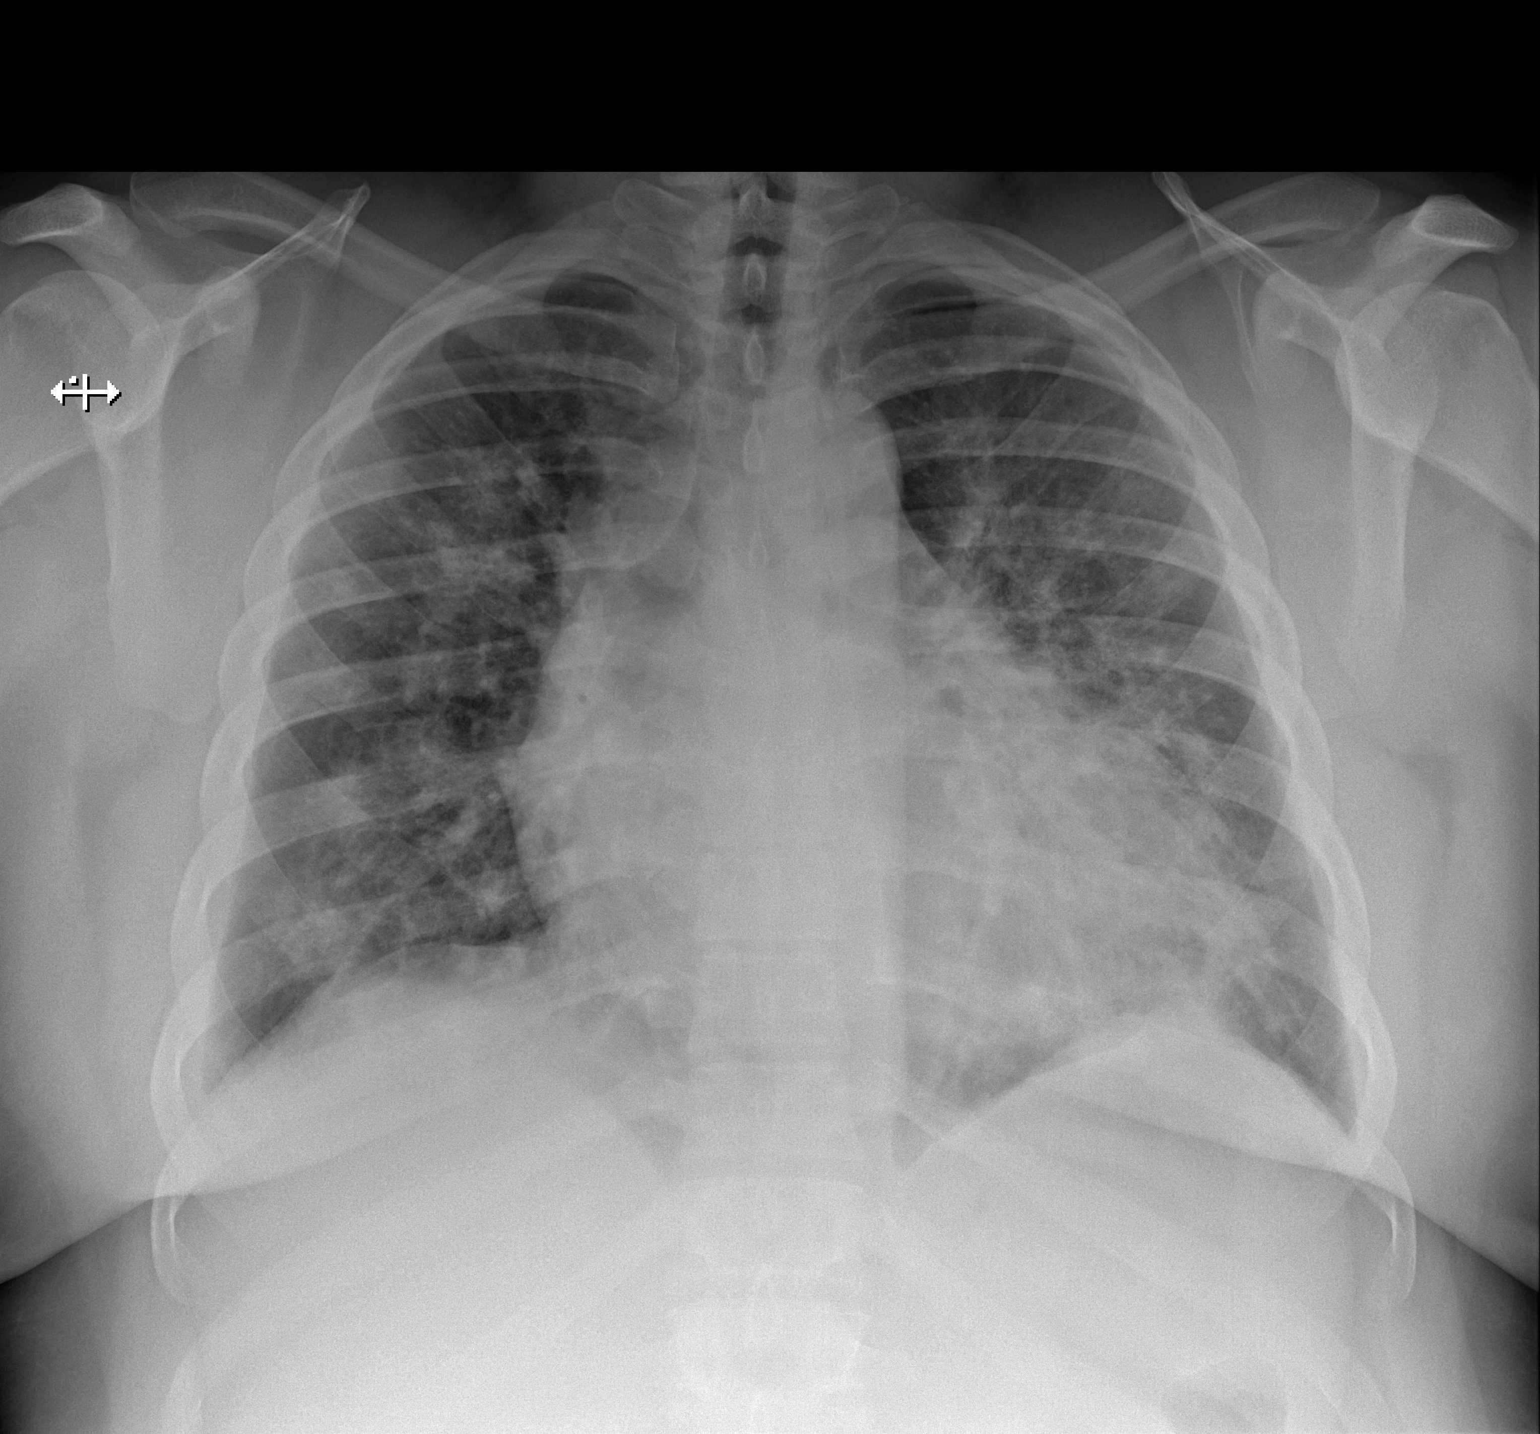
[im 2/2]
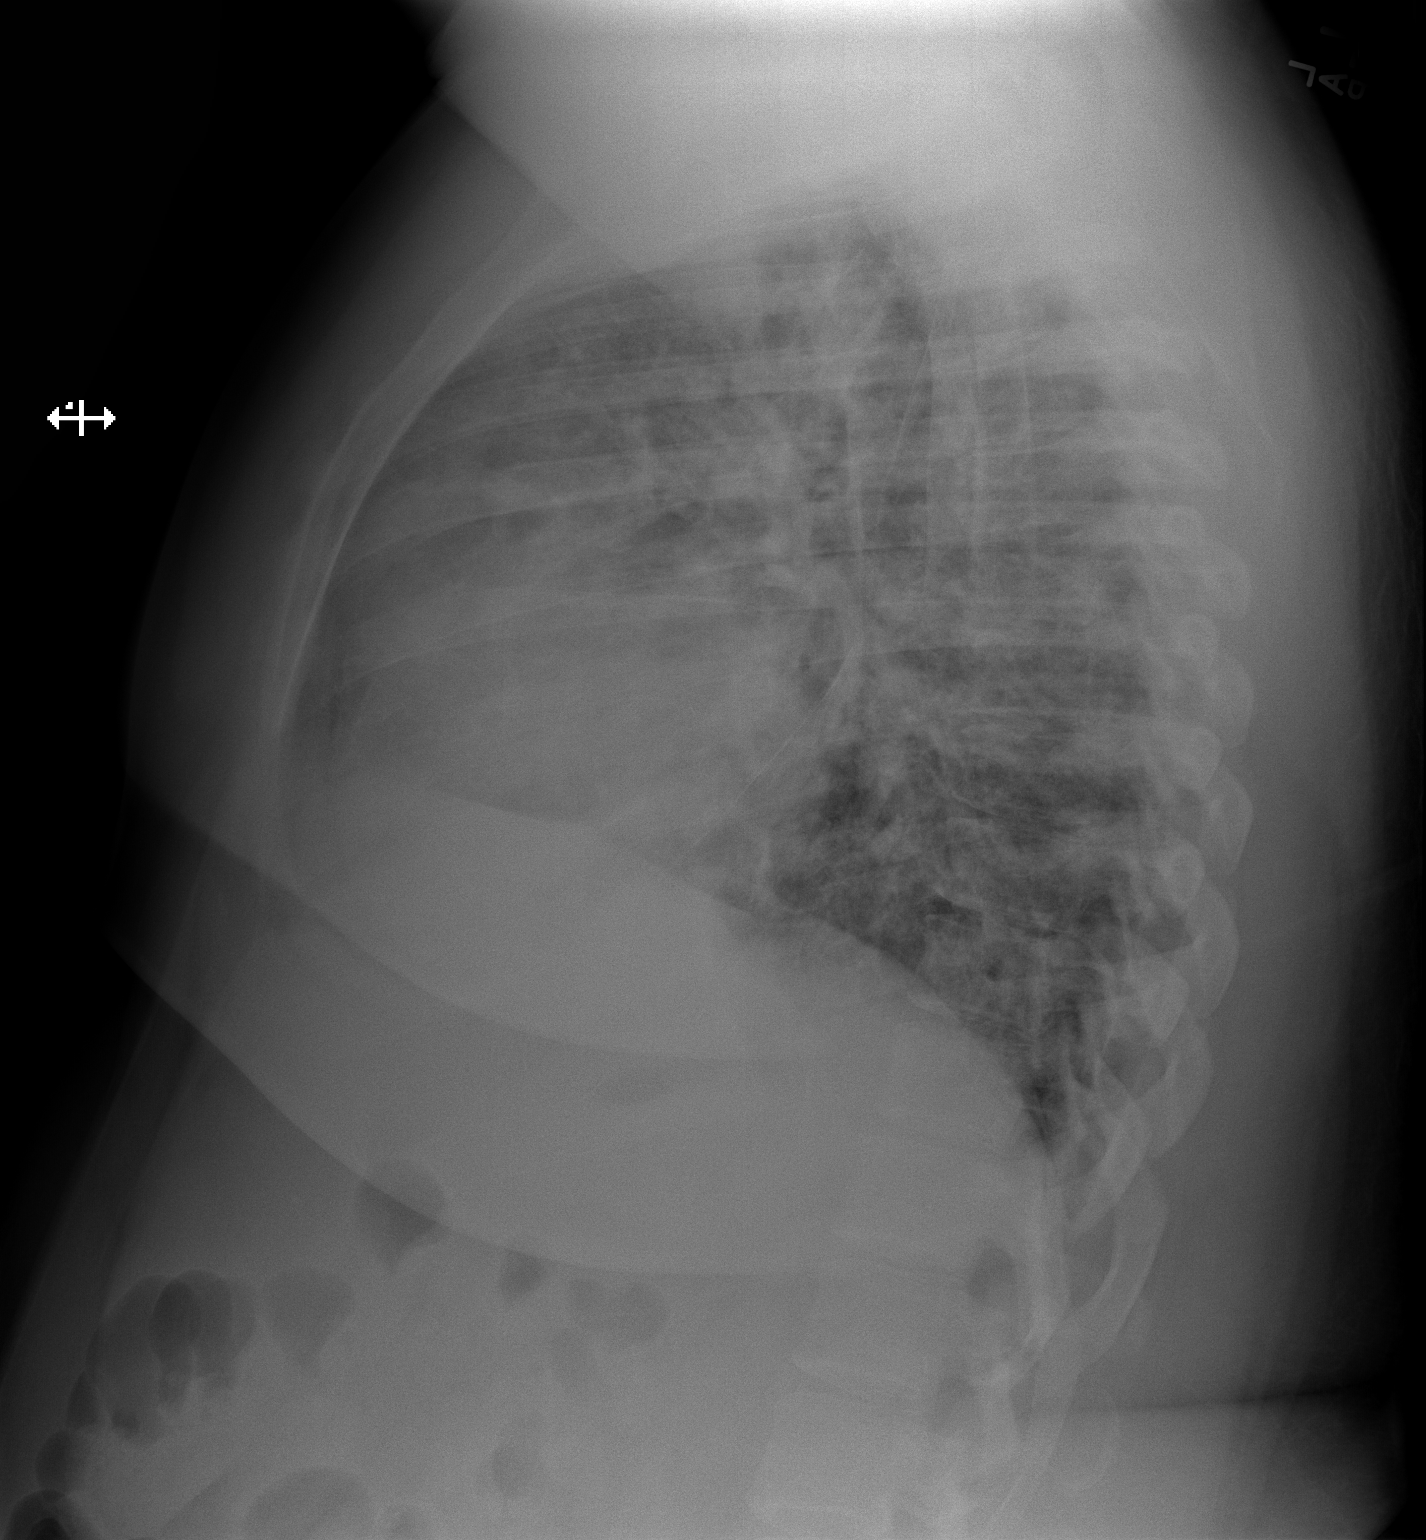

[2 of 2 positions shown; findings below may reference images not displayed]

FINDINGS: There is stable cardiac enlargement. There are new bilateral
perihilar airspace opacities with poor definition of the pulmonary
vasculature, fissural thickening and small bilateral pleural
effusions. There is no consolidation. The bones appear unchanged.
IMPRESSION: New bilateral perihilar opacities in the setting of cardiomegaly may
reflect edema or atypical infection. Small bilateral pleural
effusions.

## 2014-10-11 MED ORDER — GLIPIZIDE 5 MG PO TABS
10.0000 mg | ORAL_TABLET | Freq: Every day | ORAL | Status: DC
  Administered 2014-10-12 – 2014-10-13 (×2): 10 mg via ORAL
  Filled 2014-10-11 (×2): qty 2

## 2014-10-11 MED ORDER — HYDRALAZINE HCL 20 MG/ML IJ SOLN
10.0000 mg | INTRAMUSCULAR | Status: DC | PRN
  Administered 2014-10-12: 10 mg via INTRAVENOUS
  Filled 2014-10-11: qty 1

## 2014-10-11 MED ORDER — METOPROLOL TARTRATE 1 MG/ML IV SOLN
INTRAVENOUS | Status: AC
  Administered 2014-10-11: 5 mg via INTRAVENOUS
  Filled 2014-10-11: qty 5

## 2014-10-11 MED ORDER — SODIUM CHLORIDE 0.9 % IJ SOLN
3.0000 mL | Freq: Two times a day (BID) | INTRAMUSCULAR | Status: DC
  Administered 2014-10-12 (×2): 3 mL via INTRAVENOUS

## 2014-10-11 MED ORDER — IPRATROPIUM-ALBUTEROL 0.5-2.5 (3) MG/3ML IN SOLN: 3.0000 mL | RESPIRATORY_TRACT | Status: DC | PRN

## 2014-10-11 MED ORDER — ONDANSETRON HCL 4 MG PO TABS: 4.0000 mg | ORAL_TABLET | Freq: Four times a day (QID) | ORAL | Status: DC | PRN

## 2014-10-11 MED ORDER — ENOXAPARIN SODIUM 40 MG/0.4ML ~~LOC~~ SOLN
40.0000 mg | Freq: Every day | SUBCUTANEOUS | Status: DC
  Administered 2014-10-12: 40 mg via SUBCUTANEOUS
  Filled 2014-10-11: qty 0.4

## 2014-10-11 MED ORDER — ONDANSETRON HCL 4 MG/2ML IJ SOLN: 4.0000 mg | Freq: Four times a day (QID) | INTRAMUSCULAR | Status: DC | PRN

## 2014-10-11 MED ORDER — FUROSEMIDE 10 MG/ML IJ SOLN
20.0000 mg | Freq: Once | INTRAMUSCULAR | Status: AC
Start: 2014-10-11 — End: 2014-10-11
  Administered 2014-10-11: 20 mg via INTRAVENOUS
  Filled 2014-10-11: qty 4

## 2014-10-11 MED ORDER — NITROGLYCERIN 2 % TD OINT
0.5000 [in_us] | TOPICAL_OINTMENT | Freq: Four times a day (QID) | TRANSDERMAL | Status: DC
  Administered 2014-10-11 – 2014-10-12 (×2): 0.5 [in_us] via TOPICAL
  Filled 2014-10-11 (×2): qty 1

## 2014-10-11 MED ORDER — NITROGLYCERIN 0.4 MG SL SUBL
0.4000 mg | SUBLINGUAL_TABLET | SUBLINGUAL | Status: DC | PRN
  Administered 2014-10-11: 0.4 mg via SUBLINGUAL
  Filled 2014-10-11: qty 1

## 2014-10-11 MED ORDER — ACETAMINOPHEN 650 MG RE SUPP: 650.0000 mg | Freq: Four times a day (QID) | RECTAL | Status: DC | PRN

## 2014-10-11 MED ORDER — ASPIRIN 81 MG PO CHEW
324.0000 mg | CHEWABLE_TABLET | Freq: Once | ORAL | Status: AC
  Administered 2014-10-11: 324 mg via ORAL

## 2014-10-11 MED ORDER — INSULIN ASPART 100 UNIT/ML ~~LOC~~ SOLN
0.0000 [IU] | Freq: Three times a day (TID) | SUBCUTANEOUS | Status: DC
  Administered 2014-10-12 (×2): 3 [IU] via SUBCUTANEOUS
  Administered 2014-10-12: 4 [IU] via SUBCUTANEOUS
  Administered 2014-10-13 (×2): 3 [IU] via SUBCUTANEOUS
  Filled 2014-10-11: qty 3
  Filled 2014-10-11: qty 4
  Filled 2014-10-11 (×3): qty 3

## 2014-10-11 MED ORDER — LISINOPRIL 20 MG PO TABS
40.0000 mg | ORAL_TABLET | Freq: Every day | ORAL | Status: DC
  Administered 2014-10-12 – 2014-10-13 (×2): 40 mg via ORAL
  Filled 2014-10-11 (×2): qty 2

## 2014-10-11 MED ORDER — HYDRALAZINE HCL 20 MG/ML IJ SOLN
10.0000 mg | Freq: Once | INTRAMUSCULAR | Status: AC
  Administered 2014-10-11: 10 mg via INTRAVENOUS
  Filled 2014-10-11: qty 1

## 2014-10-11 MED ORDER — HYDROCHLOROTHIAZIDE 12.5 MG PO CAPS
12.5000 mg | ORAL_CAPSULE | Freq: Every day | ORAL | Status: DC
  Administered 2014-10-12 – 2014-10-13 (×2): 12.5 mg via ORAL
  Filled 2014-10-11 (×2): qty 1

## 2014-10-11 MED ORDER — FUROSEMIDE 10 MG/ML IJ SOLN
40.0000 mg | Freq: Two times a day (BID) | INTRAMUSCULAR | Status: DC
  Administered 2014-10-12 (×3): 40 mg via INTRAVENOUS
  Filled 2014-10-11 (×4): qty 4

## 2014-10-11 MED ORDER — METOPROLOL TARTRATE 1 MG/ML IV SOLN
5.0000 mg | Freq: Once | INTRAVENOUS | Status: AC
  Administered 2014-10-11: 5 mg via INTRAVENOUS

## 2014-10-11 MED ORDER — METOPROLOL TARTRATE 50 MG PO TABS
50.0000 mg | ORAL_TABLET | Freq: Two times a day (BID) | ORAL | Status: DC
  Administered 2014-10-12: 50 mg via ORAL
  Filled 2014-10-11: qty 1

## 2014-10-11 MED ORDER — HYDRALAZINE HCL 20 MG/ML IJ SOLN
10.0000 mg | INTRAMUSCULAR | Status: DC | PRN
  Administered 2014-10-11: 10 mg via INTRAVENOUS
  Filled 2014-10-11: qty 1

## 2014-10-11 MED ORDER — INSULIN ASPART 100 UNIT/ML ~~LOC~~ SOLN: 0.0000 [IU] | Freq: Every day | SUBCUTANEOUS | Status: DC

## 2014-10-11 MED ORDER — ASPIRIN 81 MG PO CHEW
CHEWABLE_TABLET | ORAL | Status: AC
  Administered 2014-10-11: 324 mg via ORAL
  Filled 2014-10-11: qty 4

## 2014-10-11 MED ORDER — ACETAMINOPHEN 325 MG PO TABS
ORAL_TABLET | ORAL | Status: AC
  Administered 2014-10-11: 650 mg via ORAL
  Filled 2014-10-11: qty 2

## 2014-10-11 MED ORDER — ACETAMINOPHEN 325 MG PO TABS
650.0000 mg | ORAL_TABLET | Freq: Four times a day (QID) | ORAL | Status: DC | PRN
  Administered 2014-10-11: 650 mg via ORAL

## 2014-10-11 NOTE — ED Notes (Signed)
Pt using urinal and able to urinate without difficulty. Pt resting in bed with family at bedside.

## 2014-10-11 NOTE — ED Notes (Signed)
Pt reports congestion and need for afrin. MD does not want pt to take decongestant and pt is given tissues and instructed to use for congestion.

## 2014-10-11 NOTE — ED Notes (Signed)
Pt presents with c/o shortness of breath for one week espicially with exertion. Also c/o bilateral lower leg and feet swelling.

## 2014-10-11 NOTE — ED Notes (Signed)
Pt reports having a tickle in throat and is coughing on and off. Pt reports this happens at times but does not feel like there is something in his throat. Denies feeling like it is GERD. Pt has reported hx of heartburn.

## 2014-10-11 NOTE — ED Provider Notes (Signed)
[facility] Emergency Department Provider Note  ____________________________________________  Time seen: Approximately 6:13 PM  I have reviewed the triage vital signs and the nursing notes.   HISTORY  Chief Complaint No chief complaint on file.    HPI Joel [name] is a 34 y.o. male history of hypertension, diabetes, hyperlipidemia, CHF with EF 45-50% who presents for evaluation of gradual onset shortness of breath ongoing for 1 week, worse over the past 3 days, constant, worsened with exertion and lying flat. No chest pain. He has had nonproductive cough, no fever. He has also noted leg swelling and 9 pound weight gain in the past week. Current severity of symptoms is moderate to severe.   Past Medical History  Diagnosis Date  . Hypertension   . Diabetes mellitus without complication     Patient Active Problem List   Diagnosis Date Noted  . Hypertensive urgency 06/13/2014    Past Surgical History  Procedure Laterality Date  . No past surgeries      Current Outpatient Rx  Name  Route  Sig  Dispense  Refill  . aspirin-acetaminophen-caffeine (EXCEDRIN MIGRAINE) 250-250-65 MG per tablet   Oral   Take 2 tablets by mouth every 6 (six) hours as needed for headache.         Marland Kitchen glipiZIDE (GLUCOTROL) 10 MG tablet   Oral   Take 1 tablet (10 mg total) by mouth daily before breakfast. Patient not taking: Reported on 10/11/2014   30 tablet   0   . hydrochlorothiazide (MICROZIDE) 12.5 MG capsule   Oral   Take 1 capsule (12.5 mg total) by mouth daily. Patient not taking: Reported on 10/11/2014   30 capsule   0   . lisinopril (PRINIVIL,ZESTRIL) 40 MG tablet   Oral   Take 1 tablet (40 mg total) by mouth daily. Patient not taking: Reported on 10/11/2014   30 tablet   0   . metoprolol (LOPRESSOR) 50 MG tablet   Oral   Take 1 tablet (50 mg total) by mouth 2 (two) times daily. Patient not taking: Reported on 10/11/2014   30 tablet   0      Allergies Review of patient's allergies indicates no known allergies.  No family history on file.  Social History Social History  Substance Use Topics  . Smoking status: Never Smoker   . Smokeless tobacco: Never Used  . Alcohol Use: 1.2 oz/week    2 Cans of beer per week    Review of Systems Constitutional: No fever/chills Eyes: No visual changes. ENT: No sore throat. Cardiovascular: Denies chest pain. Respiratory: +shortness of breath. Gastrointestinal: No abdominal pain.  No nausea, no vomiting.  No diarrhea.  No constipation. Genitourinary: Negative for dysuria. Musculoskeletal: Negative for back pain. Skin: Negative for rash. Neurological: Negative for headaches, focal weakness or numbness.  10-point ROS otherwise negative.  ____________________________________________   PHYSICAL EXAM:  VITAL SIGNS: ED Triage Vitals  Enc Vitals Group     BP --      Pulse Rate 10/11/14 1728 122     Resp 10/11/14 1728 22     Temp 10/11/14 1728 98.9 F (37.2 C)     Temp Source 10/11/14 1728 Oral     SpO2 10/11/14 1728 98 %     Weight 10/11/14 1728 308 lb (139.708 kg)     Height 10/11/14 1728 5\' 7"  (1.702 m)     Head Cir --      Peak Flow --  Pain Score 10/11/14 1729 4     Pain Loc --      Pain Edu? --      Excl. in GC? --     Constitutional: Alert and oriented. Nontoxic-appearing, mildly tachypneic. Obese body habitus limits examination. Eyes: Conjunctivae are normal. PERRL. EOMI. Head: Atraumatic. Nose: No congestion/rhinnorhea. Mouth/Throat: Mucous membranes are moist.  Oropharynx non-erythematous. Neck: No stridor.  Cardiovascular: tachycardic rate, regular rhythm. Grossly normal heart sounds.  Good peripheral circulation. Respiratory:mildly increased work of breathing. Diminished breath sounds throughout all lung fields.No retractions.  Gastrointestinal: Soft and nontender. No distention. No abdominal bruits. No CVA tenderness. Genitourinary:  deferred Musculoskeletal: 1+ edema bilateral lower extremities. No joint effusions. Neurologic:  Normal speech and language. No gross focal neurologic deficits are appreciated. No gait instability. Skin:  Skin is warm, dry and intact. No rash noted. Psychiatric: Mood and affect are normal. Speech and behavior are normal.  ____________________________________________   LABS (all labs ordered are listed, but only abnormal results are displayed)  Labs Reviewed  BASIC METABOLIC PANEL - Abnormal; Notable for the following:    Potassium 3.3 (*)    Glucose, Bld 202 (*)    BUN 28 (*)    Creatinine, Ser 1.82 (*)    Calcium 8.8 (*)    GFR calc non Af Amer 48 (*)    GFR calc Af Amer 55 (*)    All other components within normal limits  CBC - Abnormal; Notable for the following:    Hemoglobin 12.7 (*)    HCT 37.8 (*)    MCV 77.4 (*)    MCH 25.9 (*)    RDW 15.9 (*)    All other components within normal limits  TROPONIN I - Abnormal; Notable for the following:    Troponin I 0.08 (*)    All other components within normal limits  CULTURE, BLOOD (ROUTINE X 2)  CULTURE, BLOOD (ROUTINE X 2)  BRAIN NATRIURETIC PEPTIDE  HEPATIC FUNCTION PANEL  [facility] ONLY)   ____________________________________________  EKG  ED ECG REPORT I, Gayla Doss, the attending physician, personally viewed and interpreted this ECG.   Date: 10/11/2014  EKG Time: 17:41  Rate: 116  Rhythm: sinus tachycardia  Axis: normal  Intervals:none  ST&T Change: no acute ST segment elevation. T-wave inversions in lead aVL. EKG unchanged from 06/14/2014.  ____________________________________________  RADIOLOGY  CXR  FINDINGS: There is stable cardiac enlargement. There are new bilateral perihilar airspace opacities with poor definition of the pulmonary vasculature, fissural thickening and small bilateral pleural effusions. There is no consolidation. The bones appear unchanged.  IMPRESSION: New bilateral  perihilar opacities in the setting of cardiomegaly may reflect edema or atypical infection. Small bilateral pleural effusions.  ____________________________________________   PROCEDURES  Procedure(s) performed: None  Critical Care performed: Yes, see critical care note(s). Critical care time spent 30 minutes.  ____________________________________________   INITIAL IMPRESSION / ASSESSMENT AND PLAN / ED COURSE  Pertinent labs & imaging results that were available during my care of the patient were reviewed by me and considered in my medical decision making (see chart for details).  Joel [name] is a 34 y.o. male history of hypertension, diabetes, hyperlipidemia, CHF with EF 45-50% who presents for evaluation of gradual onset shortness of breath for the past week. On exam he is tachycardic and tachypneic with diminished breath sounds throughout, mild edema bilateral lower extremities. 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 We'll treat his blood pressure with hydralazine, labetalol as needed, sublingual nitroglycerin, start Lasix. Anticipate admission. No productive cough, no fever,no leukocytosis, doubt pneumonia.   ----------------------------------------- 7:34 PM on 10/11/2014 -----------------------------------------  Blood pressure improving the above treatment, currently 163/89 (improved from 209/132). Troponin is elevated at 0.08, possibly secondary to demand ischemia. Aspirin ordered. Case discussed with hospitalist for admission at this time. ____________________________________________   FINAL CLINICAL IMPRESSION(S) / ED DIAGNOSES  Final diagnoses:  Hypertensive emergency  Acute on chronic congestive heart failure, unspecified congestive heart failure type      Gayla Doss, MD 10/11/14 [phone]

## 2014-10-11 NOTE — H&P (Signed)
[facility] - [place] at [facility]   PATIENT NAME: Joel [name]    MR#:  [id number]  DATE OF BIRTH:  [birth year]  DATE OF ADMISSION:  10/11/2014  PRIMARY CARE PHYSICIAN: No primary care provider on file.   REQUESTING/REFERRING PHYSICIAN: Dr. Chari Manning  CHIEF COMPLAINT:  No chief complaint on file.  shortness of breath and lower extremity edema  HISTORY OF PRESENT ILLNESS:  Geroge [name]  is a 34 y.o. male with a known history of hypertension, type 2 diabetes, history of diastolic CHF, who presents to the hospital due to shortness of breath and lower extremity edema and noted to be in congestive heart failure. Patient was recently admitted to the hospital about 2 months ago with a similar diagnosis. Patient apparently lost his job about 2 months ago and has not taken any of his prescription medications. Patient presented to the ER due to worsening shortness of breath even at rest with a 9 pound weight gain over the past week. Patient was noted to be in acute congestive heart failure and also noted to have accelerated hypertension and hospitalist services were contacted for further treatment and evaluation. Patient admits to daily to 3 pillow orthopnea and paroxysmal nocturnal dyspnea but denies any chest pain, nausea, vomiting, palpitations, syncope or any other associated symptoms presently.  PAST MEDICAL HISTORY:   Past Medical History  Diagnosis Date  . Hypertension   . Diabetes mellitus without complication     PAST SURGICAL HISTORY:   Past Surgical History  Procedure Laterality Date  . No past surgeries      SOCIAL HISTORY:   Social History  Substance Use Topics  . Smoking status: Current Some Day Smoker  . Smokeless tobacco: Never Used  . Alcohol Use: 1.2 oz/week    2 Cans of beer per week    FAMILY HISTORY:   Family History  Problem Relation Age of Onset  . Hypertension Mother   . Hyperlipidemia Mother   . CAD Father     DRUG  ALLERGIES:  No Known Allergies  REVIEW OF SYSTEMS:   Review of Systems  Constitutional: Negative for fever and weight loss.  HENT: Negative for congestion, nosebleeds and tinnitus.   Eyes: Negative for blurred vision, double vision and redness.  Respiratory: Positive for cough and shortness of breath. Negative for hemoptysis.   Cardiovascular: Positive for orthopnea and leg swelling. Negative for chest pain and PND.  Gastrointestinal: Negative for nausea, vomiting, abdominal pain, diarrhea and melena.  Genitourinary: Negative for dysuria, urgency and hematuria.  Musculoskeletal: Negative for joint pain and falls.  Neurological: Negative for dizziness, tingling, sensory change, focal weakness, seizures, weakness and headaches.  Endo/Heme/Allergies: Negative for polydipsia. Does not bruise/bleed easily.  Psychiatric/Behavioral: Negative for depression and memory loss. The patient is not nervous/anxious.     MEDICATIONS AT HOME:   Prior to Admission medications   Medication Sig Start Date End Date Taking? Authorizing Provider  aspirin-acetaminophen-caffeine (EXCEDRIN MIGRAINE) [id number] MG per tablet Take 2 tablets by mouth every 6 (six) hours as needed for headache.   Yes Historical Provider, MD  glipiZIDE (GLUCOTROL) 10 MG tablet Take 1 tablet (10 mg total) by mouth daily before breakfast. Patient not taking: Reported on 10/11/2014 06/13/14   Gracelyn Nurse, MD  hydrochlorothiazide (MICROZIDE) 12.5 MG capsule Take 1 capsule (12.5 mg total) by mouth daily. Patient not taking: Reported on 10/11/2014 06/13/14   Gracelyn Nurse, MD  lisinopril (PRINIVIL,ZESTRIL) 40 MG tablet Take 1 tablet (40 mg  total) by mouth daily. Patient not taking: Reported on 10/11/2014 06/13/14   Gracelyn Nurse, MD  metoprolol (LOPRESSOR) 50 MG tablet Take 1 tablet (50 mg total) by mouth 2 (two) times daily. Patient not taking: Reported on 10/11/2014 06/13/14   Gracelyn Nurse, MD      VITAL SIGNS:  Blood pressure  163/89, pulse 119, temperature 98.9 F (37.2 C), temperature source Oral, resp. rate 24, height  (1.702 m), weight 139.708 kg (308 lb), SpO2 93 %.  PHYSICAL EXAMINATION:  Physical Exam  GENERAL:  34 y.o.-year-old obese patient lying in the bed in mild respiratory distress.  EYES: Pupils equal, round, reactive to light and accommodation. No scleral icterus. Extraocular muscles intact.  HEENT: Head atraumatic, normocephalic. Oropharynx and nasopharynx clear. No oropharyngeal erythema, moist oral mucosa  NECK:  Supple, no jugular venous distention. No thyroid enlargement, no tenderness.  LUNGS: Normal breath sounds bilaterally, no wheezing, rhonchi, bibasilar Rales. No use of accessory muscles of respiration.  CARDIOVASCULAR: S1, S2 RRR. No murmurs, rubs, gallops, clicks.  ABDOMEN: Soft, nontender, nondistended. Bowel sounds present. No organomegaly or mass.  EXTREMITIES: +1 pedal edema bilaterally, no cyanosis, or clubbing. + 2 pedal & radial pulses b/l.   NEUROLOGIC: Cranial nerves II through XII are intact. No focal Motor or sensory deficits appreciated b/l PSYCHIATRIC: The patient is alert and oriented x 3. Good affect.  SKIN: No obvious rash, lesion, or ulcer.   LABORATORY PANEL:   CBC  Recent Labs Lab 10/11/14 1737  WBC 10.0  HGB 12.7*  HCT 37.8*  PLT 344   ------------------------------------------------------------------------------------------------------------------  Chemistries   Recent Labs Lab 10/11/14 1737  NA 137  K 3.3*  CL 101  CO2 26  GLUCOSE 202*  BUN 28*  CREATININE 1.82*  CALCIUM 8.8*   ------------------------------------------------------------------------------------------------------------------  Cardiac Enzymes  Recent Labs Lab 10/11/14 1737  TROPONINI 0.08*   ------------------------------------------------------------------------------------------------------------------  RADIOLOGY:  Dg Chest 2 View  10/11/2014   CLINICAL DATA:   Shortness of breath for 3 days. Foot swelling for 1 day. Nonsmoker. Initial encounter.  EXAM: CHEST  2 VIEW  COMPARISON:  06/11/2014 portable chest.  FINDINGS: There is stable cardiac enlargement. There are new bilateral perihilar airspace opacities with poor definition of the pulmonary vasculature, fissural thickening and small bilateral pleural effusions. There is no consolidation. The bones appear unchanged.  IMPRESSION: New bilateral perihilar opacities in the setting of cardiomegaly may reflect edema or atypical infection. Small bilateral pleural effusions.   Electronically Signed   By: Carey Bullocks M.D.   On: 10/11/2014 18:27     IMPRESSION AND PLAN:   34 year old male with past medical history of diabetes, hypertension, history of diastolic CHF who presents to the hospital due to shortness of breath and worsening lower extremity edema and noted to be in congestive heart failure.  #1 acute congestive heart failure-likely cause of patient's worsening shortness of breath and lower extremity edema. -This is likely secondary to medical noncompliance and due to accelerated hypertension. -This is acute on chronic diastolic dysfunction. I will diurese the patient with IV Lasix, follow I's and O's, daily weights. -Continue metoprolol, lisinopril.  #2 elevated troponin-likely in the setting of demand ischemia from the CHF. -We'll observe on telemetry, cycle cardiac markers.  #3 accelerated hypertension-likely secondary to medical noncompliance. -Continue metoprolol, lisinopril, HCTZ, will add some when necessary hydralazine.  #4 type 2 diabetes-continue glipizide, will add some sliding scale insulin. Check hemoglobin A1c.  #5 acute renal failure-likely the setting of CHF. -We'll diurese  the patient with IV Lasix and follow BUN and creatinine.    All the records are reviewed and case discussed with ED provider. Management plans discussed with the patient, family and they are in  agreement.  CODE STATUS: Full  TOTAL TIME TAKING CARE OF THIS PATIENT: 50 minutes.    Houston Siren M.D on 10/11/2014 at 8:02 PM  Between 7am to 6pm - Pager - [phone]  After 6pm go to [email or web address] - password [email or web address] [email or web address]  [place]  [facility]  Office  [phone]  CC: Primary care physician; No primary care provider on file.

## 2014-10-11 NOTE — ED Notes (Signed)
Pt requests Cpap to help with breathing. MD notified. Pt reports feeling as though he can not take a deep breath. Pt is not prescribed Cpap but uses roomates at home. Pt apears anxious while talking to RN. RN spoke with pt about slowing breath and attempting to calm down in order to help with breathing and stress about congestion.

## 2014-10-11 NOTE — ED Notes (Signed)
POC CBG at 2310= 200mg /dL

## 2014-10-11 NOTE — ED Notes (Signed)
Pt reports he has noticed swelling in legs beginning today. No edema noted but swelling is present. Pt also states that he has noticed gaining 9 pounds this past week. Pt denies changes in urination.

## 2014-10-12 LAB — HEMOGLOBIN A1C: Hgb A1c MFr Bld: 7.3 % — ABNORMAL HIGH (ref 4.0–6.0)

## 2014-10-12 LAB — BASIC METABOLIC PANEL
Anion gap: 11 (ref 5–15)
BUN: 26 mg/dL — ABNORMAL HIGH (ref 6–20)
CO2: 28 mmol/L (ref 22–32)
Calcium: 8.9 mg/dL (ref 8.9–10.3)
Chloride: 98 mmol/L — ABNORMAL LOW (ref 101–111)
Creatinine, Ser: 2 mg/dL — ABNORMAL HIGH (ref 0.61–1.24)
GFR calc Af Amer: 49 mL/min — ABNORMAL LOW (ref >60)
GFR calc non Af Amer: 42 mL/min — ABNORMAL LOW (ref >60)
Glucose, Bld: 175 mg/dL — ABNORMAL HIGH (ref 65–99)
Potassium: 3.2 mmol/L — ABNORMAL LOW (ref 3.5–5.1)
Sodium: 137 mmol/L (ref 135–145)

## 2014-10-12 LAB — TROPONIN I
Troponin I: 0.06 ng/mL — ABNORMAL HIGH (ref <0.031)
Troponin I: 0.07 ng/mL — ABNORMAL HIGH (ref <0.031)

## 2014-10-12 LAB — GLUCOSE, CAPILLARY
Glucose-Capillary: 137 mg/dL — ABNORMAL HIGH (ref 65–99)
Glucose-Capillary: 158 mg/dL — ABNORMAL HIGH (ref 65–99)
Glucose-Capillary: 141 mg/dL — ABNORMAL HIGH (ref 65–99)
Glucose-Capillary: 147 mg/dL — ABNORMAL HIGH (ref 65–99)

## 2014-10-12 LAB — CBC
HCT: 36.9 % — ABNORMAL LOW (ref 40.0–52.0)
Hemoglobin: 12.5 g/dL — ABNORMAL LOW (ref 13.0–18.0)
MCH: 26.1 pg (ref 26.0–34.0)
MCHC: 33.7 g/dL (ref 32.0–36.0)
MCV: 77.3 fL — ABNORMAL LOW (ref 80.0–100.0)
Platelets: 351 10*3/uL (ref 150–440)
RBC: 4.78 MIL/uL (ref 4.40–5.90)
RDW: 15.8 % — ABNORMAL HIGH (ref 11.5–14.5)
WBC: 9.8 10*3/uL (ref 3.8–10.6)

## 2014-10-12 MED ORDER — SODIUM CHLORIDE 0.9 % IJ SOLN
3.0000 mL | INTRAMUSCULAR | Status: DC | PRN
  Administered 2014-10-13: 3 mL via INTRAVENOUS
  Filled 2014-10-12: qty 10

## 2014-10-12 MED ORDER — INFLUENZA VAC SPLIT QUAD 0.5 ML IM SUSY: 0.5000 mL | PREFILLED_SYRINGE | INTRAMUSCULAR | Status: DC

## 2014-10-12 MED ORDER — ISOSORBIDE MONONITRATE ER 30 MG PO TB24
30.0000 mg | ORAL_TABLET | Freq: Every day | ORAL | Status: DC
  Administered 2014-10-12 – 2014-10-13 (×2): 30 mg via ORAL
  Filled 2014-10-12 (×2): qty 1

## 2014-10-12 MED ORDER — METOPROLOL TARTRATE 100 MG PO TABS
100.0000 mg | ORAL_TABLET | Freq: Two times a day (BID) | ORAL | Status: DC
  Administered 2014-10-12 – 2014-10-13 (×3): 100 mg via ORAL
  Filled 2014-10-12 (×3): qty 1

## 2014-10-12 MED ORDER — HEPARIN SODIUM (PORCINE) 5000 UNIT/ML IJ SOLN
5000.0000 [IU] | Freq: Three times a day (TID) | INTRAMUSCULAR | Status: DC
  Administered 2014-10-12 – 2014-10-13 (×3): 5000 [IU] via SUBCUTANEOUS
  Filled 2014-10-12 (×3): qty 1

## 2014-10-12 MED ORDER — OXYCODONE HCL 5 MG PO TABS
5.0000 mg | ORAL_TABLET | ORAL | Status: DC | PRN
  Administered 2014-10-12: 5 mg via ORAL
  Filled 2014-10-12: qty 1

## 2014-10-12 MED ORDER — SALINE SPRAY 0.65 % NA SOLN
1.0000 | NASAL | Status: DC | PRN
  Filled 2014-10-12: qty 44

## 2014-10-12 MED ORDER — BUTALBITAL-APAP-CAFFEINE 50-325-40 MG PO TABS
1.0000 | ORAL_TABLET | Freq: Once | ORAL | Status: AC
  Administered 2014-10-12: 1 via ORAL
  Filled 2014-10-12: qty 1

## 2014-10-12 MED ORDER — ASPIRIN 81 MG PO CHEW
81.0000 mg | CHEWABLE_TABLET | Freq: Every day | ORAL | Status: DC
  Administered 2014-10-12 – 2014-10-13 (×2): 81 mg via ORAL
  Filled 2014-10-12 (×2): qty 1

## 2014-10-12 MED ORDER — MORPHINE SULFATE (PF) 2 MG/ML IV SOLN: 2.0000 mg | INTRAVENOUS | Status: DC | PRN

## 2014-10-12 NOTE — Progress Notes (Signed)
Pt complaining of a 7 out of 10 headache, was given tylenol in the ED & didn't help, pt takes excedrin migraine at home. MD paged, will order something for headache & some PRN pain medicine as well. Will continue to monitor. Shirley Friar, RN

## 2014-10-12 NOTE — Progress Notes (Signed)
Initial Nutrition Assessment       INTERVENTION:  Meals and snacks: Cater to pt preferences Nutrition diet education: Pt and significant other very familiar with diet restrictions.  No further diet education needed at this time.   NUTRITION DIAGNOSIS:    (None at this time) related to   as evidenced by  .    GOAL:   Patient will meet greater than or equal to 90% of their needs    MONITOR:    (Energy intake, Electrolyte and renal profile, Anthropometric)  REASON FOR ASSESSMENT:   Diagnosis    ASSESSMENT:      Pt admitted with CHF  Past Medical History  Diagnosis Date  . Hypertension   . Diabetes mellitus without complication     Current Nutrition: eating 100% of meals  Food/Nutrition-Related History:  Normal appetite prior to admission   Medications: lasix, glipizide, aspart  Electrolyte/Renal Profile and Glucose Profile:   Recent Labs Lab 10/11/14 1737 10/12/14 0450  NA 137 137  K 3.3* 3.2*  CL 101 98*  CO2 26 28  BUN 28* 26*  CREATININE 1.82* 2.00*  CALCIUM 8.8* 8.9  GLUCOSE 202* 175*   Protein Profile:  Recent Labs Lab 10/11/14 1737  ALBUMIN 3.2*    Gastrointestinal Profile: Last BM:8/29    Weight Change: fluid wt gain prior to admission    Diet Order:  Diet heart healthy/carb modified Room service appropriate?: Yes; Fluid consistency:: Thin  Skin:   reviewed   Height:   Ht Readings from Last 1 Encounters:  10/11/14 5\' 7"  (1.702 m)    Weight:   Wt Readings from Last 1 Encounters:  10/12/14 305 lb 12.8 oz (138.71 kg)     BMI:  Body mass index is 47.88 kg/(m^2).  EDUCATION NEEDS:   Education needs addressed  LOW Care Level  Joel [name], RD, LDN [phone] (pager)

## 2014-10-12 NOTE — Progress Notes (Addendum)
Pt requesting CPAP at night, pt states he has been wearing his room mates the last few nights, MD to place order for CPAP. No other concerns. Will continue to monitor. Shirley Friar, RN

## 2014-10-12 NOTE — Progress Notes (Signed)
[facility] - [place] at [facility]   PATIENT NAME: Joel [name]    MR#:  [id number]  DATE OF BIRTH:  [birth year]  SUBJECTIVE:  CHIEF COMPLAINT:  No chief complaint on file.  Feels better with SOB REVIEW OF SYSTEMS:    Review of Systems  Constitutional: Positive for malaise/fatigue. Negative for fever and chills.  HENT: Negative for sore throat.   Eyes: Negative for blurred vision, double vision and pain.  Respiratory: Positive for cough and shortness of breath. Negative for hemoptysis and wheezing.   Cardiovascular: Positive for leg swelling. Negative for chest pain, palpitations and orthopnea.  Gastrointestinal: Negative for heartburn, nausea, vomiting, abdominal pain, diarrhea and constipation.  Genitourinary: Negative for dysuria and hematuria.  Musculoskeletal: Negative for back pain and joint pain.  Skin: Negative for rash.  Neurological: Positive for headaches. Negative for sensory change, speech change and focal weakness.  Endo/Heme/Allergies: Does not bruise/bleed easily.  Psychiatric/Behavioral: Negative for depression. The patient is not nervous/anxious.       DRUG ALLERGIES:  No Known Allergies  VITALS:  Blood pressure 116/72, pulse 88, temperature 97.8 F (36.6 C), temperature source Oral, resp. rate 20, height 5\' 7"  (1.702 m), weight 138.71 kg (305 lb 12.8 oz), SpO2 100 %.  PHYSICAL EXAMINATION:   Physical Exam  GENERAL:  34 y.o.-year-old patient lying in the bed with no acute distress.  EYES: Pupils equal, round, reactive to light and accommodation. No scleral icterus. Extraocular muscles intact.  HEENT: Head atraumatic, normocephalic. Oropharynx and nasopharynx clear.  NECK:  Supple, no jugular venous distention. No thyroid enlargement, no tenderness.  LUNGS: Normal breath sounds bilaterally, no wheezing, rales, rhonchi. No use of accessory muscles of respiration.  CARDIOVASCULAR: S1, S2 normal. No murmurs, rubs, or gallops.   ABDOMEN: Soft, nontender, nondistended. Bowel sounds present. No organomegaly or mass.  EXTREMITIES: No cyanosis, clubbing or edema b/l.    NEUROLOGIC: Cranial nerves II through XII are intact. No focal Motor or sensory deficits b/l.   PSYCHIATRIC: The patient is alert and oriented x 3.  SKIN: No obvious rash, lesion, or ulcer.    LABORATORY PANEL:   CBC  Recent Labs Lab 10/12/14 0450  WBC 9.8  HGB 12.5*  HCT 36.9*  PLT 351   ------------------------------------------------------------------------------------------------------------------  Chemistries   Recent Labs Lab 10/11/14 1737 10/12/14 0450  NA 137 137  K 3.3* 3.2*  CL 101 98*  CO2 26 28  GLUCOSE 202* 175*  BUN 28* 26*  CREATININE 1.82* 2.00*  CALCIUM 8.8* 8.9  AST 36  --   ALT 58  --   ALKPHOS 68  --   BILITOT 0.8  --    ------------------------------------------------------------------------------------------------------------------  Cardiac Enzymes  Recent Labs Lab 10/12/14 0450  TROPONINI 0.06*   ------------------------------------------------------------------------------------------------------------------  RADIOLOGY:  Dg Chest 2 View  10/11/2014   CLINICAL DATA:  Shortness of breath for 3 days. Foot swelling for 1 day. Nonsmoker. Initial encounter.  EXAM: CHEST  2 VIEW  COMPARISON:  06/11/2014 portable chest.  FINDINGS: There is stable cardiac enlargement. There are new bilateral perihilar airspace opacities with poor definition of the pulmonary vasculature, fissural thickening and small bilateral pleural effusions. There is no consolidation. The bones appear unchanged.  IMPRESSION: New bilateral perihilar opacities in the setting of cardiomegaly may reflect edema or atypical infection. Small bilateral pleural effusions.   Electronically Signed   By: Carey Bullocks M.D.   On: 10/11/2014 18:27     ASSESSMENT AND PLAN:   34 year old male with past  medical history of diabetes, hypertension,  history of diastolic CHF who presents to the hospital due to shortness of breath and worsening lower extremity edema and noted to be in congestive heart failure.  # Acute on chronic systolic congestive heart failure Due to noncompliance with medications and accident or hypertension. Feels better today her edema improved. Continue IV Lasix. Monitor input and output. Repeat creatinine in the morning.  # Elevated troponin-likely in the setting of demand ischemia from the CHF. -We'll observe on telemetry, cycle cardiac markers.  # Accelerated hypertension-likely secondary to medical noncompliance. -Continue metoprolol, lisinopril, HCTZ Add Imdur.  # Type 2 diabetes-continue glipizide, will add some sliding scale insulin. Check hemoglobin A1c.  # CKD3   All the records are reviewed and case discussed with Care Management/Social Workerr. Management plans discussed with the patient, family and they are in agreement.  CODE STATUS: Full  DVT Prophylaxis: SCDs  TOTAL TIME TAKING CARE OF THIS PATIENT: 35 minutes.   POSSIBLE D/C IN 1-2 DAYS, DEPENDING ON CLINICAL CONDITION.   Milagros Loll R M.D on 10/12/2014 at 12:11 PM  Between 7am to 6pm - Pager - [phone]  After 6pm go to [email or web address] - password [email or web address] [email or web address]  [address] [place] [facility]  Office  [phone]  CC: Primary care physician; No primary care provider on file.

## 2014-10-12 NOTE — Care Management (Addendum)
Patient is admitted with new onset CHF.  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  His wife "brings home" 1200 dollars a month.  Was being followed by [facility], but only made the one visit and then lost his job.  Spoke about the [employer], [facility], and the sliding scale clinics associated with [facility] [employer] [facility].  Will anticipate a heart failure clinic referral.  He has used "his friend's cpap" machine over the last few nights.  He has never had a sleep study.  Had been informed even with insurance out of pocket would be 1000 dollars.  Even though it has been reported patient has a cpap he uses at night- this is not the case. Denies issues with transportation.  Patient had a job interview for today that will be rescheduled.  He anticipates returning to work.  Will assess discharge meds for affordability

## 2014-10-12 NOTE — Progress Notes (Signed)
BP 144/90, pt states headache down to a 3-10

## 2014-10-12 NOTE — Progress Notes (Signed)
Dr. Elpidio Anis updated on pt's BP and interventions taken.

## 2014-10-12 NOTE — Progress Notes (Signed)
Pt complaining of severe headache with nausea.  Pt states "from nitro paste"   Nitropaste removed. Oxycodone 5mg  po given.  Hydralazine 10mg  iv given for BP 182/136.  Will monitor pt.

## 2014-10-13 ENCOUNTER — Encounter: Payer: Self-pay | Admitting: Internal Medicine

## 2014-10-13 LAB — GLUCOSE, CAPILLARY
Glucose-Capillary: 200 mg/dL — ABNORMAL HIGH (ref 65–99)
Glucose-Capillary: 140 mg/dL — ABNORMAL HIGH (ref 65–99)
Glucose-Capillary: 121 mg/dL — ABNORMAL HIGH (ref 65–99)

## 2014-10-13 LAB — BASIC METABOLIC PANEL
Anion gap: 11 (ref 5–15)
BUN: 28 mg/dL — ABNORMAL HIGH (ref 6–20)
CO2: 29 mmol/L (ref 22–32)
Calcium: 8.9 mg/dL (ref 8.9–10.3)
Chloride: 99 mmol/L — ABNORMAL LOW (ref 101–111)
Creatinine, Ser: 2.26 mg/dL — ABNORMAL HIGH (ref 0.61–1.24)
GFR calc Af Amer: 42 mL/min — ABNORMAL LOW (ref >60)
GFR calc non Af Amer: 37 mL/min — ABNORMAL LOW (ref >60)
Glucose, Bld: 139 mg/dL — ABNORMAL HIGH (ref 65–99)
Potassium: 3.1 mmol/L — ABNORMAL LOW (ref 3.5–5.1)
Sodium: 139 mmol/L (ref 135–145)

## 2014-10-13 MED ORDER — ISOSORBIDE MONONITRATE ER 30 MG PO TB24: 30.0000 mg | ORAL_TABLET | Freq: Every day | ORAL | Status: DC

## 2014-10-13 MED ORDER — LISINOPRIL-HYDROCHLOROTHIAZIDE 20-12.5 MG PO TABS: 1.0000 | ORAL_TABLET | Freq: Every day | ORAL | Status: DC

## 2014-10-13 MED ORDER — POTASSIUM CHLORIDE CRYS ER 20 MEQ PO TBCR
40.0000 meq | EXTENDED_RELEASE_TABLET | Freq: Once | ORAL | Status: AC
  Administered 2014-10-13: 40 meq via ORAL
  Filled 2014-10-13: qty 2

## 2014-10-13 MED ORDER — ASPIRIN 81 MG PO CHEW: 81.0000 mg | CHEWABLE_TABLET | Freq: Every day | ORAL | Status: DC

## 2014-10-13 MED ORDER — GLIPIZIDE 10 MG PO TABS: 10.0000 mg | ORAL_TABLET | Freq: Every day | ORAL | Status: DC

## 2014-10-13 MED ORDER — FUROSEMIDE 20 MG PO TABS: 20.0000 mg | ORAL_TABLET | Freq: Every day | ORAL | Status: DC

## 2014-10-13 MED ORDER — POTASSIUM CHLORIDE ER 10 MEQ PO TBCR: 10.0000 meq | EXTENDED_RELEASE_TABLET | Freq: Every day | ORAL | Status: DC

## 2014-10-13 MED ORDER — METOPROLOL TARTRATE 100 MG PO TABS: 100.0000 mg | ORAL_TABLET | Freq: Two times a day (BID) | ORAL | Status: DC

## 2014-10-13 NOTE — Discharge Instructions (Signed)

## 2014-10-13 NOTE — Care Management (Signed)
Reviewed list of discharge medications with patient and his wife and they both verbalized that they would be ab le to purchase the meds.  Provided patient with list of clinics accepting new patient as they do not seem interested in pursuing [facility] at present.  The do have the application for the [facility].  Verbalize understanding of importance of keeping the appointment with the [facility] Clinic

## 2014-10-13 NOTE — Progress Notes (Signed)
Pt discharged to home via wc.  Instructions  given to pt.  Questions answered.  No distress.  

## 2014-10-13 NOTE — Discharge Summary (Signed)
[facility] [place] [facility]   PATIENT NAME: Joel [name]    MR#:  [id number]  DATE OF BIRTH:  [birth year]  DATE OF ADMISSION:  10/11/2014 ADMITTING PHYSICIAN: Houston Siren, MD  DATE OF DISCHARGE: 10/13/2014 12:34 PM  PRIMARY CARE PHYSICIAN: No primary care provider on file.    ADMISSION DIAGNOSIS:  Hypertensive emergency [I10] Acute on chronic congestive heart failure, unspecified congestive heart failure type [I50.9]  DISCHARGE DIAGNOSIS:  Active Problems:   Hypertensive urgency   CHF (congestive heart failure)   Shortness of breath   SECONDARY DIAGNOSIS:   Past Medical History  Diagnosis Date  . Hypertension   . Diabetes mellitus without complication      ADMITTING HISTORY  Joel [name] is a 34 y.o. male with a known history of hypertension, type 2 diabetes, history of diastolic CHF, who presents to the hospital due to shortness of breath and lower extremity edema and noted to be in congestive heart failure. Patient was recently admitted to the hospital about 2 months ago with a similar diagnosis. Patient apparently lost his job about 2 months ago and has not taken any of his prescription medications. Patient presented to the ER due to worsening shortness of breath even at rest with a 9 pound weight gain over the past week. Patient was noted to be in acute congestive heart failure and also noted to have accelerated hypertension and hospitalist services were contacted for further treatment and evaluation. Patient admits to daily to 3 pillow orthopnea and paroxysmal nocturnal dyspnea but denies any chest pain, nausea, vomiting, palpitations, syncope or any other associated symptoms presently.   HOSPITAL COURSE:   34 year old male with past medical history of diabetes, hypertension, history of diastolic CHF who presents to the hospital due to shortness of breath and worsening lower extremity edema and noted to be in congestive heart failure.  #  Acute on chronic systolic congestive heart failure Resolved with IV diuresis. We'll place him on oral Lasix at discharge along with potassium supplementation. His decompensation was due to accelerated hypertension from noncompliance.  # Elevated troponin-likely in the setting of demand ischemia from the CHF.  # Accelerated hypertension-likely secondary to medical noncompliance. -Continue metoprolol, lisinopril, HCTZ Added Imdur. Discussed with patient regarding all his medications being on [facility] $4 list.  # Type 2 diabetes-continue glipizide.  # CKD3 Mild worsening of kidney function due to IV diuretics. Also likely from his hypertensive urgency. His hypertension is much improved. Needs repeat BUN and creatinine when he follows with his primary care physician.  Stable for discharge home.  CONSULTS OBTAINED:     DRUG ALLERGIES:  No Known Allergies  DISCHARGE MEDICATIONS:   Discharge Medication List as of 10/13/2014 12:12 PM    START taking these medications   Details  aspirin 81 MG chewable tablet Chew 1 tablet (81 mg total) by mouth daily., Starting 10/13/2014, Until Discontinued, OTC    furosemide (LASIX) 20 MG tablet Take 1 tablet (20 mg total) by mouth daily., Starting 10/13/2014, Until Discontinued, Normal    isosorbide mononitrate (IMDUR) 30 MG 24 hr tablet Take 1 tablet (30 mg total) by mouth daily., Starting 10/13/2014, Until Discontinued, Normal    lisinopril-hydrochlorothiazide (ZESTORETIC) 20-12.5 MG per tablet Take 1 tablet by mouth daily., Starting 10/13/2014, Until Discontinued, Normal    potassium chloride (K-DUR) 10 MEQ tablet Take 1 tablet (10 mEq total) by mouth daily., Starting 10/13/2014, Until Discontinued, Normal      CONTINUE these medications which have  CHANGED   Details  glipiZIDE (GLUCOTROL) 10 MG tablet Take 1 tablet (10 mg total) by mouth daily before breakfast., Starting 10/13/2014, Until Discontinued, Normal    metoprolol (LOPRESSOR) 100 MG tablet Take  1 tablet (100 mg total) by mouth 2 (two) times daily., Starting 10/13/2014, Until Discontinued, Normal      CONTINUE these medications which have NOT CHANGED   Details  aspirin-acetaminophen-caffeine (EXCEDRIN MIGRAINE) 250-250-65 MG per tablet Take 2 tablets by mouth every 6 (six) hours as needed for headache., Until Discontinued, Historical Med      STOP taking these medications     hydrochlorothiazide (MICROZIDE) 12.5 MG capsule      lisinopril (PRINIVIL,ZESTRIL) 40 MG tablet        Today   VITAL SIGNS:  Blood pressure 132/85, pulse 97, temperature 98.2 F (36.8 C), temperature source Oral, resp. rate 18, height  (1.702 m), weight 135.898 kg (299 lb 9.6 oz), SpO2 94 %.  I/O:   Intake/Output Summary (Last 24 hours) at 10/13/14 1333 Last data filed at 10/13/14 0906  Gross per 24 hour  Intake    243 ml  Output   1400 ml  Net  -1157 ml    PHYSICAL EXAMINATION:  Physical Exam  GENERAL:  34 y.o.-year-old patient lying in the bed with no acute distress.  LUNGS: Normal breath sounds bilaterally, no wheezing, rales,rhonchi or crepitation. No use of accessory muscles of respiration.  CARDIOVASCULAR: S1, S2 normal. No murmurs, rubs, or gallops.  ABDOMEN: Soft, non-tender, non-distended. Bowel sounds present. No organomegaly or mass.  NEUROLOGIC: Moves all 4 extremities. PSYCHIATRIC: The patient is alert and oriented x 3.  SKIN: No obvious rash, lesion, or ulcer.   DATA REVIEW:   CBC  Recent Labs Lab 10/12/14 0450  WBC 9.8  HGB 12.5*  HCT 36.9*  PLT 351    Chemistries   Recent Labs Lab 10/11/14 1737  10/13/14 0534  NA 137  < > 139  K 3.3*  < > 3.1*  CL 101  < > 99*  CO2 26  < > 29  GLUCOSE 202*  < > 139*  BUN 28*  < > 28*  CREATININE 1.82*  < > 2.26*  CALCIUM 8.8*  < > 8.9  AST 36  --   --   ALT 58  --   --   ALKPHOS 68  --   --   BILITOT 0.8  --   --   < > = values in this interval not displayed.  Cardiac Enzymes  Recent Labs Lab  10/12/14 0450  TROPONINI 0.06*    Microbiology Results  Results for orders placed or performed during the hospital encounter of 10/11/14  Blood culture (routine x 2)     Status: None (Preliminary result)   Collection Time: 10/11/14  6:40 PM  Result Value Ref Range Status   Specimen Description BLOOD RIGHT ARM  Final   Special Requests BOTTLES DRAWN AEROBIC AND ANAEROBIC 4CC  Final   Culture NO GROWTH 2 DAYS  Final   Report Status PENDING  Incomplete  Blood culture (routine x 2)     Status: None (Preliminary result)   Collection Time: 10/11/14  6:51 PM  Result Value Ref Range Status   Specimen Description BLOOD RIGHT ARM  Final   Special Requests BOTTLES DRAWN AEROBIC AND ANAEROBIC 4CC  Final   Culture NO GROWTH 2 DAYS  Final   Report Status PENDING  Incomplete    RADIOLOGY:  Dg Chest 2  View  10/11/2014   CLINICAL DATA:  Shortness of breath for 3 days. Foot swelling for 1 day. Nonsmoker. Initial encounter.  EXAM: CHEST  2 VIEW  COMPARISON:  06/11/2014 portable chest.  FINDINGS: There is stable cardiac enlargement. There are new bilateral perihilar airspace opacities with poor definition of the pulmonary vasculature, fissural thickening and small bilateral pleural effusions. There is no consolidation. The bones appear unchanged.  IMPRESSION: New bilateral perihilar opacities in the setting of cardiomegaly may reflect edema or atypical infection. Small bilateral pleural effusions.   Electronically Signed   By: Carey Bullocks M.D.   On: 10/11/2014 18:27      Follow up with PCP at [facility] in 1 week.  Management plans discussed with the patient, family and they are in agreement.  CODE STATUS:     Code Status Orders        Start     Ordered   10/11/14 2347  Full code   Continuous     10/11/14 2346      TOTAL TIME TAKING CARE OF THIS PATIENT ON DAY OF DISCHARGE: more than 30 minutes.    Milagros Loll R M.D on 10/13/2014 at 1:33 PM  Between 7am to 6pm - Pager -  [phone]  After 6pm go to [email or web address] - password [email or web address] [facility]  [facility]  Office  [phone]  CC: Primary care physician; No primary care provider on file.

## 2014-10-16 LAB — CULTURE, BLOOD (ROUTINE X 2)
Culture: NO GROWTH
Culture: NO GROWTH

## 2017-02-12 ENCOUNTER — Encounter: Payer: Self-pay | Admitting: Emergency Medicine

## 2017-02-12 ENCOUNTER — Emergency Department: Payer: Medicaid Other

## 2017-02-12 ENCOUNTER — Inpatient Hospital Stay
Admission: EM | Admit: 2017-02-12 | Discharge: 2017-02-14 | DRG: 291 | Disposition: A | Discharge status: Home / Self Care | Payer: Medicaid Other | Attending: Internal Medicine | Admitting: Internal Medicine

## 2017-02-12 ENCOUNTER — Inpatient Hospital Stay (HOSPITAL_COMMUNITY)
Admit: 2017-02-12 | Discharge: 2017-02-12 | Disposition: A | Discharge status: Home / Self Care | Payer: Medicaid Other | Attending: Physician Assistant | Admitting: Physician Assistant

## 2017-02-12 ENCOUNTER — Other Ambulatory Visit: Payer: Self-pay

## 2017-02-12 DIAGNOSIS — Z91148 Patient's other noncompliance with medication regimen for other reason: Secondary | ICD-10-CM

## 2017-02-12 DIAGNOSIS — Z9114 Patient's other noncompliance with medication regimen: Secondary | ICD-10-CM | POA: Diagnosis not present

## 2017-02-12 DIAGNOSIS — Z7984 Long term (current) use of oral hypoglycemic drugs: Secondary | ICD-10-CM

## 2017-02-12 DIAGNOSIS — Z6841 Body Mass Index (BMI) 40.0 and over, adult: Secondary | ICD-10-CM | POA: Diagnosis not present

## 2017-02-12 DIAGNOSIS — E1122 Type 2 diabetes mellitus with diabetic chronic kidney disease: Secondary | ICD-10-CM | POA: Diagnosis present

## 2017-02-12 DIAGNOSIS — I161 Hypertensive emergency: Secondary | ICD-10-CM | POA: Diagnosis present

## 2017-02-12 DIAGNOSIS — Z23 Encounter for immunization: Secondary | ICD-10-CM

## 2017-02-12 DIAGNOSIS — Z79899 Other long term (current) drug therapy: Secondary | ICD-10-CM | POA: Diagnosis not present

## 2017-02-12 DIAGNOSIS — I130 Hypertensive heart and chronic kidney disease with heart failure and stage 1 through stage 4 chronic kidney disease, or unspecified chronic kidney disease: Secondary | ICD-10-CM | POA: Diagnosis present

## 2017-02-12 DIAGNOSIS — R079 Chest pain, unspecified: Secondary | ICD-10-CM

## 2017-02-12 DIAGNOSIS — R778 Other specified abnormalities of plasma proteins: Secondary | ICD-10-CM

## 2017-02-12 DIAGNOSIS — R6881 Early satiety: Secondary | ICD-10-CM | POA: Diagnosis present

## 2017-02-12 DIAGNOSIS — R05 Cough: Secondary | ICD-10-CM | POA: Diagnosis present

## 2017-02-12 DIAGNOSIS — Z7982 Long term (current) use of aspirin: Secondary | ICD-10-CM | POA: Diagnosis not present

## 2017-02-12 DIAGNOSIS — N184 Chronic kidney disease, stage 4 (severe): Secondary | ICD-10-CM | POA: Diagnosis present

## 2017-02-12 DIAGNOSIS — N179 Acute kidney failure, unspecified: Secondary | ICD-10-CM | POA: Diagnosis present

## 2017-02-12 DIAGNOSIS — R7309 Other abnormal glucose: Secondary | ICD-10-CM

## 2017-02-12 DIAGNOSIS — E785 Hyperlipidemia, unspecified: Secondary | ICD-10-CM | POA: Diagnosis present

## 2017-02-12 DIAGNOSIS — I5043 Acute on chronic combined systolic (congestive) and diastolic (congestive) heart failure: Secondary | ICD-10-CM | POA: Diagnosis present

## 2017-02-12 DIAGNOSIS — I5033 Acute on chronic diastolic (congestive) heart failure: Secondary | ICD-10-CM

## 2017-02-12 DIAGNOSIS — I2489 Other forms of acute ischemic heart disease: Secondary | ICD-10-CM | POA: Diagnosis present

## 2017-02-12 DIAGNOSIS — I248 Other forms of acute ischemic heart disease: Secondary | ICD-10-CM | POA: Diagnosis not present

## 2017-02-12 DIAGNOSIS — I5020 Unspecified systolic (congestive) heart failure: Secondary | ICD-10-CM

## 2017-02-12 DIAGNOSIS — E1165 Type 2 diabetes mellitus with hyperglycemia: Secondary | ICD-10-CM | POA: Diagnosis present

## 2017-02-12 DIAGNOSIS — R0683 Snoring: Secondary | ICD-10-CM | POA: Diagnosis present

## 2017-02-12 DIAGNOSIS — F17210 Nicotine dependence, cigarettes, uncomplicated: Secondary | ICD-10-CM | POA: Diagnosis present

## 2017-02-12 DIAGNOSIS — Z888 Allergy status to other drugs, medicaments and biological substances status: Secondary | ICD-10-CM | POA: Diagnosis not present

## 2017-02-12 DIAGNOSIS — N183 Chronic kidney disease, stage 3 unspecified: Secondary | ICD-10-CM

## 2017-02-12 DIAGNOSIS — N1831 Chronic kidney disease, stage 3a: Secondary | ICD-10-CM

## 2017-02-12 DIAGNOSIS — R0602 Shortness of breath: Secondary | ICD-10-CM

## 2017-02-12 DIAGNOSIS — J810 Acute pulmonary edema: Secondary | ICD-10-CM

## 2017-02-12 DIAGNOSIS — R7989 Other specified abnormal findings of blood chemistry: Secondary | ICD-10-CM

## 2017-02-12 HISTORY — DX: Chronic diastolic (congestive) heart failure: I50.32

## 2017-02-12 HISTORY — DX: Obesity, unspecified: E66.9

## 2017-02-12 HISTORY — DX: Patient's other noncompliance with medication regimen: Z91.14

## 2017-02-12 HISTORY — DX: Patient's other noncompliance with medication regimen for other reason: Z91.148

## 2017-02-12 HISTORY — DX: Hypertensive heart disease without heart failure: I11.9

## 2017-02-12 HISTORY — DX: Heart disease, unspecified: I51.9

## 2017-02-12 LAB — CBC
HCT: 44.2 % (ref 40.0–52.0)
Hemoglobin: 14.9 g/dL (ref 13.0–18.0)
MCH: 26.7 pg (ref 26.0–34.0)
MCHC: 33.8 g/dL (ref 32.0–36.0)
MCV: 79.2 fL — ABNORMAL LOW (ref 80.0–100.0)
Platelets: 312 10*3/uL (ref 150–440)
RBC: 5.58 MIL/uL (ref 4.40–5.90)
RDW: 14.7 % — ABNORMAL HIGH (ref 11.5–14.5)
WBC: 7.2 10*3/uL (ref 3.8–10.6)

## 2017-02-12 LAB — HEMOGLOBIN A1C
Hgb A1c MFr Bld: 14.7 % — ABNORMAL HIGH (ref 4.8–5.6)
Mean Plasma Glucose: 375.19 mg/dL

## 2017-02-12 LAB — URINE DRUG SCREEN, QUALITATIVE (ARMC ONLY)
Amphetamines, Ur Screen: NOT DETECTED
Barbiturates, Ur Screen: NOT DETECTED
Benzodiazepine, Ur Scrn: NOT DETECTED
Cannabinoid 50 Ng, Ur ~~LOC~~: NOT DETECTED
Cocaine Metabolite,Ur ~~LOC~~: NOT DETECTED
MDMA (Ecstasy)Ur Screen: NOT DETECTED
Methadone Scn, Ur: NOT DETECTED
Opiate, Ur Screen: NOT DETECTED
Phencyclidine (PCP) Ur S: NOT DETECTED
Tricyclic, Ur Screen: NOT DETECTED

## 2017-02-12 LAB — COMPREHENSIVE METABOLIC PANEL
ALT: 16 U/L — ABNORMAL LOW (ref 17–63)
AST: 25 U/L (ref 15–41)
Albumin: 3 g/dL — ABNORMAL LOW (ref 3.5–5.0)
Alkaline Phosphatase: 96 U/L (ref 38–126)
Anion gap: 9 (ref 5–15)
BUN: 22 mg/dL — ABNORMAL HIGH (ref 6–20)
CO2: 26 mmol/L (ref 22–32)
Calcium: 8.7 mg/dL — ABNORMAL LOW (ref 8.9–10.3)
Chloride: 99 mmol/L — ABNORMAL LOW (ref 101–111)
Creatinine, Ser: 1.76 mg/dL — ABNORMAL HIGH (ref 0.61–1.24)
GFR calc Af Amer: 56 mL/min — ABNORMAL LOW (ref >60)
GFR calc non Af Amer: 48 mL/min — ABNORMAL LOW (ref >60)
Glucose, Bld: 369 mg/dL — ABNORMAL HIGH (ref 65–99)
Potassium: 3.7 mmol/L (ref 3.5–5.1)
Sodium: 134 mmol/L — ABNORMAL LOW (ref 135–145)
Total Bilirubin: 0.7 mg/dL (ref 0.3–1.2)
Total Protein: 6.9 g/dL (ref 6.5–8.1)

## 2017-02-12 LAB — TSH: TSH: 2.704 u[IU]/mL (ref 0.350–4.500)

## 2017-02-12 LAB — GLUCOSE, CAPILLARY
Glucose-Capillary: 371 mg/dL — ABNORMAL HIGH (ref 65–99)
Glucose-Capillary: 349 mg/dL — ABNORMAL HIGH (ref 65–99)
Glucose-Capillary: 212 mg/dL — ABNORMAL HIGH (ref 65–99)

## 2017-02-12 LAB — TROPONIN I
Troponin I: 0.35 ng/mL (ref <0.03)
Troponin I: 0.34 ng/mL (ref <0.03)
Troponin I: 0.37 ng/mL (ref <0.03)

## 2017-02-12 LAB — BRAIN NATRIURETIC PEPTIDE: B Natriuretic Peptide: 309 pg/mL — ABNORMAL HIGH (ref 0.0–100.0)

## 2017-02-12 IMAGING — CR DG CHEST 2V
2 series · 2 of 2 positions shown · non-contrast
Comparison: [DATE]

CLINICAL DATA: Cough with shortness of breath. Diabetes with
history of diastolic CHF.

EXAM:
CHEST  2 VIEW

[chest pa]
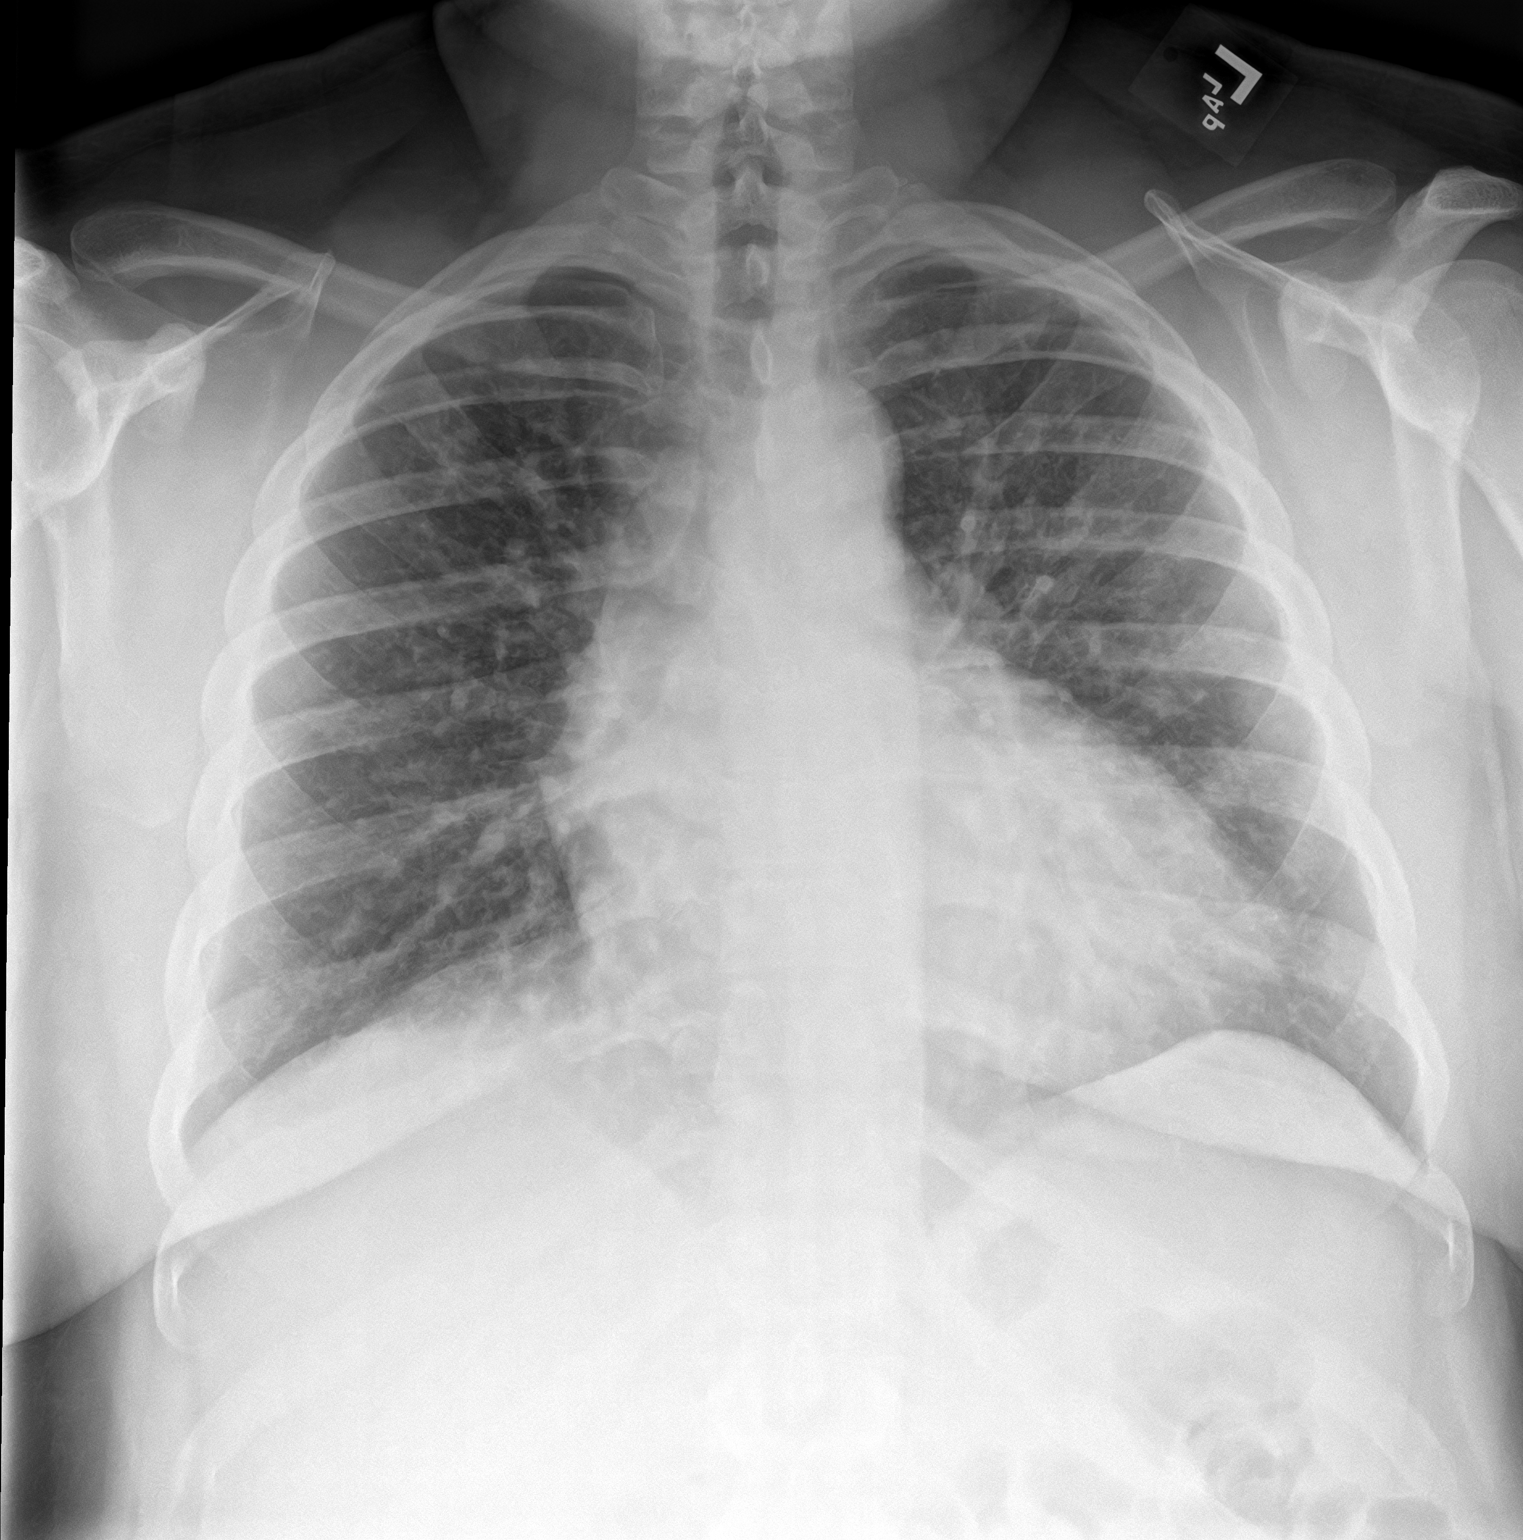

[chest lat]
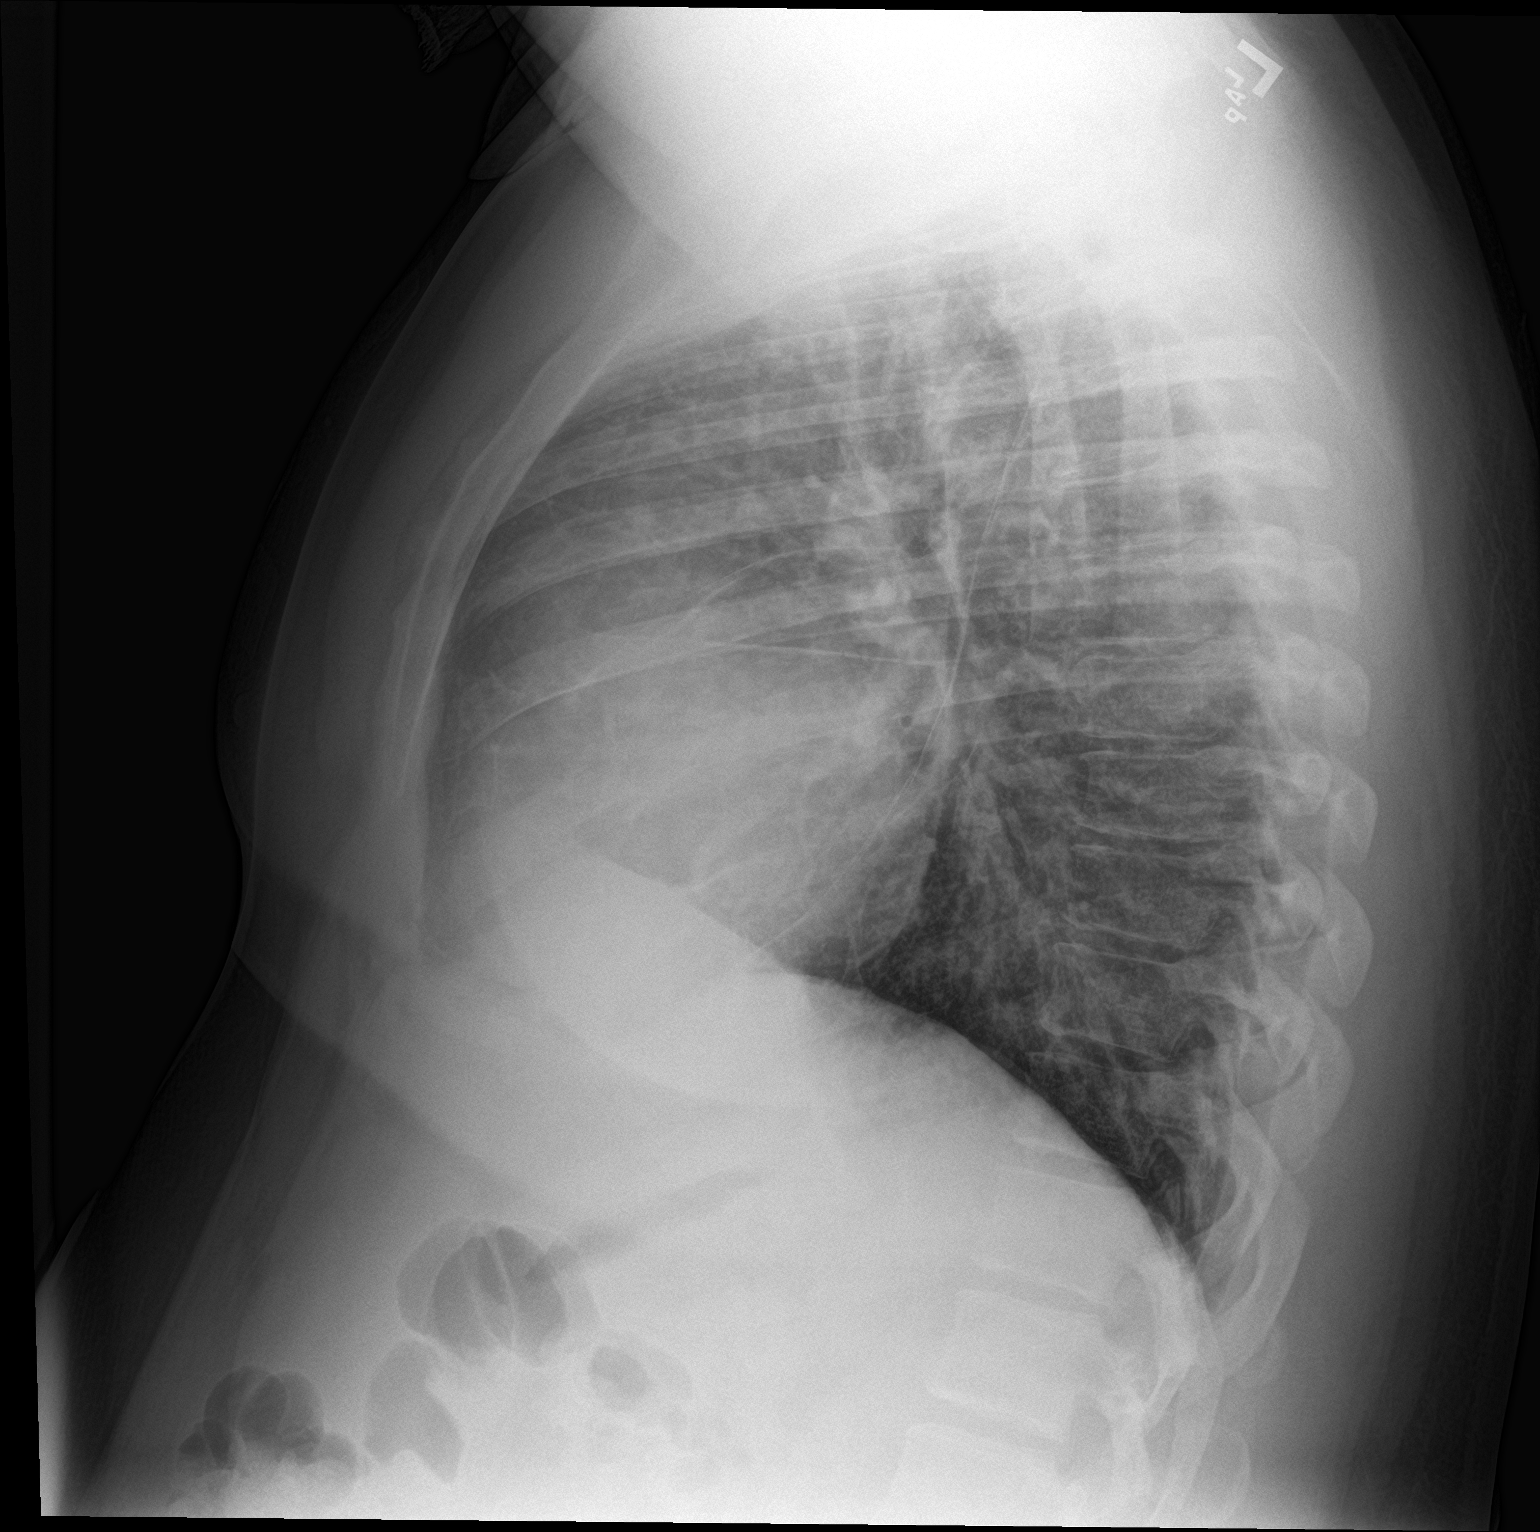

[2 of 2 positions shown; findings below may reference images not displayed]

FINDINGS: The cardio pericardial silhouette is enlarged. Vascular congestion
noted with subtle interstitial opacity. No pleural effusion. The
visualized bony structures of the thorax are intact.
IMPRESSION: 1. Cardiomegaly with vascular congestion and potential interstitial
pulmonary edema.
2. Subtle ill-defined nodularity in the parahilar regions may be
edema related although infection could have this appearance.

## 2017-02-12 MED ORDER — ONDANSETRON HCL 4 MG/2ML IJ SOLN: 4.0000 mg | Freq: Four times a day (QID) | INTRAMUSCULAR | Status: DC | PRN

## 2017-02-12 MED ORDER — BISACODYL 5 MG PO TBEC: 5.0000 mg | DELAYED_RELEASE_TABLET | Freq: Every day | ORAL | Status: DC | PRN

## 2017-02-12 MED ORDER — FUROSEMIDE 10 MG/ML IJ SOLN
20.0000 mg | Freq: Once | INTRAMUSCULAR | Status: AC
  Administered 2017-02-12: 20 mg via INTRAVENOUS
  Filled 2017-02-12: qty 4

## 2017-02-12 MED ORDER — AMLODIPINE BESYLATE 5 MG PO TABS
5.0000 mg | ORAL_TABLET | Freq: Every day | ORAL | Status: DC
  Administered 2017-02-12: 5 mg via ORAL
  Filled 2017-02-12: qty 1

## 2017-02-12 MED ORDER — ACETAMINOPHEN 650 MG RE SUPP: 650.0000 mg | Freq: Four times a day (QID) | RECTAL | Status: DC | PRN

## 2017-02-12 MED ORDER — ACETAMINOPHEN 325 MG PO TABS
650.0000 mg | ORAL_TABLET | Freq: Four times a day (QID) | ORAL | Status: DC | PRN
Start: 2017-02-12 — End: 2017-02-14

## 2017-02-12 MED ORDER — CARVEDILOL 25 MG PO TABS
25.0000 mg | ORAL_TABLET | Freq: Two times a day (BID) | ORAL | Status: DC
  Administered 2017-02-12: 25 mg via ORAL
  Filled 2017-02-12: qty 1

## 2017-02-12 MED ORDER — ONDANSETRON HCL 4 MG PO TABS: 4.0000 mg | ORAL_TABLET | Freq: Four times a day (QID) | ORAL | Status: DC | PRN

## 2017-02-12 MED ORDER — NITROGLYCERIN 2 % TD OINT: 0.5000 [in_us] | TOPICAL_OINTMENT | Freq: Four times a day (QID) | TRANSDERMAL | Status: DC

## 2017-02-12 MED ORDER — ASPIRIN EC 81 MG PO TBEC
81.0000 mg | DELAYED_RELEASE_TABLET | Freq: Every day | ORAL | Status: DC
  Administered 2017-02-12 – 2017-02-14 (×3): 81 mg via ORAL
  Filled 2017-02-12 (×3): qty 1

## 2017-02-12 MED ORDER — ASPIRIN 81 MG PO CHEW: 81.0000 mg | CHEWABLE_TABLET | Freq: Every day | ORAL | Status: DC

## 2017-02-12 MED ORDER — INSULIN ASPART 100 UNIT/ML ~~LOC~~ SOLN
0.0000 [IU] | Freq: Three times a day (TID) | SUBCUTANEOUS | Status: DC
  Administered 2017-02-12: 15 [IU] via SUBCUTANEOUS
  Administered 2017-02-12: 11 [IU] via SUBCUTANEOUS
  Administered 2017-02-13: 8 [IU] via SUBCUTANEOUS
  Administered 2017-02-13: 11 [IU] via SUBCUTANEOUS
  Filled 2017-02-12 (×4): qty 1

## 2017-02-12 MED ORDER — METOPROLOL TARTRATE 50 MG PO TABS
100.0000 mg | ORAL_TABLET | Freq: Two times a day (BID) | ORAL | Status: DC
  Administered 2017-02-12: 100 mg via ORAL
  Filled 2017-02-12: qty 2

## 2017-02-12 MED ORDER — LOSARTAN POTASSIUM 50 MG PO TABS
50.0000 mg | ORAL_TABLET | Freq: Every day | ORAL | Status: DC
  Administered 2017-02-12 – 2017-02-14 (×3): 50 mg via ORAL
  Filled 2017-02-12 (×3): qty 1

## 2017-02-12 MED ORDER — DOCUSATE SODIUM 100 MG PO CAPS
100.0000 mg | ORAL_CAPSULE | Freq: Two times a day (BID) | ORAL | Status: DC
  Administered 2017-02-12 – 2017-02-14 (×3): 100 mg via ORAL
  Filled 2017-02-12 (×3): qty 1

## 2017-02-12 MED ORDER — SODIUM CHLORIDE 0.9% FLUSH
3.0000 mL | Freq: Two times a day (BID) | INTRAVENOUS | Status: DC
  Administered 2017-02-12 – 2017-02-13 (×4): 3 mL via INTRAVENOUS

## 2017-02-12 MED ORDER — ENOXAPARIN SODIUM 40 MG/0.4ML ~~LOC~~ SOLN: 40.0000 mg | SUBCUTANEOUS | Status: DC

## 2017-02-12 MED ORDER — ENOXAPARIN SODIUM 40 MG/0.4ML ~~LOC~~ SOLN
40.0000 mg | Freq: Two times a day (BID) | SUBCUTANEOUS | Status: DC
  Administered 2017-02-12 – 2017-02-13 (×2): 40 mg via SUBCUTANEOUS
  Filled 2017-02-12 (×3): qty 0.4

## 2017-02-12 MED ORDER — INFLUENZA VAC SPLIT QUAD 0.5 ML IM SUSY
0.5000 mL | PREFILLED_SYRINGE | INTRAMUSCULAR | Status: AC
  Administered 2017-02-14: 0.5 mL via INTRAMUSCULAR
  Filled 2017-02-12: qty 0.5

## 2017-02-12 MED ORDER — ATORVASTATIN CALCIUM 20 MG PO TABS
40.0000 mg | ORAL_TABLET | Freq: Every day | ORAL | Status: DC
  Administered 2017-02-12: 40 mg via ORAL
  Filled 2017-02-12: qty 2

## 2017-02-12 MED ORDER — PNEUMOCOCCAL VAC POLYVALENT 25 MCG/0.5ML IJ INJ
0.5000 mL | INJECTION | INTRAMUSCULAR | Status: AC
  Administered 2017-02-14: 0.5 mL via INTRAMUSCULAR
  Filled 2017-02-12: qty 0.5

## 2017-02-12 MED ORDER — AMLODIPINE BESYLATE 5 MG PO TABS: 5.0000 mg | ORAL_TABLET | Freq: Two times a day (BID) | ORAL | Status: DC

## 2017-02-12 MED ORDER — ENALAPRILAT 1.25 MG/ML IV SOLN
1.2500 mg | Freq: Once | INTRAVENOUS | Status: AC
  Administered 2017-02-12: 1.25 mg via INTRAVENOUS
  Filled 2017-02-12: qty 2

## 2017-02-12 MED ORDER — FUROSEMIDE 10 MG/ML IJ SOLN
40.0000 mg | Freq: Two times a day (BID) | INTRAMUSCULAR | Status: DC
  Administered 2017-02-12 – 2017-02-13 (×2): 40 mg via INTRAVENOUS
  Filled 2017-02-12 (×2): qty 4

## 2017-02-12 NOTE — Plan of Care (Signed)
Began teaching regarding CHF.

## 2017-02-12 NOTE — Consult Note (Signed)
Cardiology Consultation:   Patient ID: Joel [name]; [id number]; [birth year]   Admit date: 02/12/2017 Date of Consult: 02/12/2017  Primary Care Provider: Patient, No Pcp Per Primary Cardiologist: New to [facility] - consult by [place]   Patient Profile:   Joel [name] is a 37 y.o. male with a hx of systolic dysfunction, chronic diastolic CHF, CKD stage III-IV, hypertensive heart disease, DM, HLD, medication noncompliance, obesity, and snoring with possible undiagnosed sleep apena who is being seen today for the evaluation of elevated troponin/acute on chronic diastolic CHF with systolic dysfunction and hypertensive emergency at the request of Dr. Luberta Mutter.  History of Present Illness:   [name] was previously evaluated in 2016 by Dr. Darrold Junker for accelerated HTN. At that time, echo showed EF 45-50%, normal wall motion, mild MR, mild AI. Medical management and improvement in his BP were advised. He has been lost to follow up since and has been off all medications for the same duration, ~ 2 years.  Over the past 2 weeks he has noticed increase in SOB with associated chest tightness while laying down. Energy level has been decreasing as well. He has been unable to sleep some nights given his orthopnea. Now having to sleep sitting up with numerous pillows behind him. Weight has trended up 6 pounds over the past 1 week. No associated lower extremity swelling or abdominal distension. Has been helping his father-in-law move kitchen appliances over the above duration without issues other than increased fatigue. He does not check his BP at home. No prior secondary hypertension workup. He has noted some early satiety along with cough that has not been productive. He denies any tobacco abuse, etoh abuse, or illegal drugs. Because of his persistent SOB and chest tightness he presented to [facility].   Upon his arrival to [facility] he was noted to have a BP of 213/146-->146/101, HR 123 bpm, temp 98.9, oxygen saturation  96% on room air, weight 276 pounds. CXR showed possible pulmonary edema. EKG as below. Labs showed SCr 1.76 (baseline ~ 1.7-1.8), troponin 0.35, BNP 309, WBC 7.2, HGB 14.9, PLT 312. In the ED he has been given IV enalipril and Lasix. Blood pressure has improved some, though continues to be elevated in the 170s systolic. Cardiology has been asked to see.    Past Medical History:  Diagnosis Date  . Chronic diastolic CHF (congestive heart failure) (HCC)   . Chronic kidney disease (CKD), stage III (moderate) (HCC)   . Diabetes mellitus with complication (HCC)   . Hypertensive heart disease   . Noncompliance with medications   . Obesity   . Systolic dysfunction    a. TTE 06/2014: EF 45-50%, normal wall motion, mild MR, mild AI    Past Surgical History:  Procedure Laterality Date  . NO PAST SURGERIES       Home Meds: Prior to Admission medications   Medication Sig Start Date End Date Taking? Authorizing Provider  aspirin-acetaminophen-caffeine (EXCEDRIN MIGRAINE) [phone] MG per tablet Take 0.5 tablets by mouth daily as needed for headache.    Yes [provider]  esomeprazole (NEXIUM) 20 MG capsule Take 20 mg by mouth daily as needed.   Yes [provider]  aspirin 81 MG chewable tablet Chew 1 tablet (81 mg total) by mouth daily. Patient not taking: Reported on 02/12/2017 10/13/14   Joel Loll, MD  furosemide (LASIX) 20 MG tablet Take 1 tablet (20 mg total) by mouth daily. Patient not taking: Reported on 02/12/2017 10/13/14  Joel Loll, MD  glipiZIDE (GLUCOTROL) 10 MG tablet Take 1 tablet (10 mg total) by mouth daily before breakfast. Patient not taking: Reported on 02/12/2017 10/13/14   Joel Loll, MD  isosorbide mononitrate (IMDUR) 30 MG 24 hr tablet Take 1 tablet (30 mg total) by mouth daily. Patient not taking: Reported on 02/12/2017 10/13/14   Joel Loll, MD  lisinopril-hydrochlorothiazide (ZESTORETIC) 20-12.5 MG per tablet Take 1 tablet by mouth daily. Patient  not taking: Reported on 02/12/2017 10/13/14   Joel Loll, MD  metoprolol (LOPRESSOR) 100 MG tablet Take 1 tablet (100 mg total) by mouth 2 (two) times daily. Patient not taking: Reported on 02/12/2017 10/13/14   Joel Loll, MD  potassium chloride (K-DUR) 10 MEQ tablet Take 1 tablet (10 mEq total) by mouth daily. Patient not taking: Reported on 02/12/2017 10/13/14   Joel Loll, MD    Inpatient Medications: Scheduled Meds: . aspirin  81 mg Oral Daily  . aspirin EC  81 mg Oral Daily  . atorvastatin  40 mg Oral q1800  . docusate sodium  100 mg Oral BID  . enoxaparin (LOVENOX) injection  40 mg Subcutaneous Q24H  . furosemide  40 mg Intravenous Q12H  . insulin aspart  0-15 Units Subcutaneous TID WC  . metoprolol tartrate  100 mg Oral BID   Continuous Infusions:  PRN Meds: acetaminophen **OR** acetaminophen, bisacodyl, ondansetron **OR** ondansetron (ZOFRAN) IV  Allergies:   Allergies  Allergen Reactions  . Nitroglycerin Other (See Comments)    Increases blood pressure and pain.    Social History:   Social History   Socioeconomic History  . Marital status: Married    Spouse name: Not on file  . Number of children: Not on file  . Years of education: Not on file  . Highest education level: Not on file  Social Needs  . Financial resource strain: Not on file  . Food insecurity - worry: Not on file  . Food insecurity - inability: Not on file  . Transportation needs - medical: Not on file  . Transportation needs - non-medical: Not on file  Occupational History  . Not on file  Tobacco Use  . Smoking status: Current Some Day Smoker  . Smokeless tobacco: Never Used  Substance and Sexual Activity  . Alcohol use: Yes    Alcohol/week: 1.2 oz    Types: 2 Cans of beer per week  . Drug use: No  . Sexual activity: Yes  Other Topics Concern  . Not on file  Social History Narrative  . Not on file     Family History:   Family History  Problem Relation Age of Onset  .  Hypertension Mother   . Hyperlipidemia Mother   . Diabetes Mother   . CAD Father        a. stenting in his early 49s  . Diabetes Maternal Grandmother   . Diabetes Maternal Grandfather   . CAD Paternal Grandmother   . CAD Paternal Grandfather     ROS:  Review of Systems  Constitutional: Positive for malaise/fatigue. Negative for chills, diaphoresis, fever and weight loss.  HENT: Negative for congestion.   Eyes: Negative for discharge and redness.  Respiratory: Positive for cough, shortness of breath and wheezing. Negative for hemoptysis and sputum production.   Cardiovascular: Positive for chest pain and claudication. Negative for palpitations, orthopnea, leg swelling and PND.  Gastrointestinal: Negative for abdominal pain, blood in stool, heartburn, melena, nausea and vomiting.  Genitourinary: Negative for hematuria.  Musculoskeletal: Negative for  falls and myalgias.  Skin: Negative for rash.  Neurological: Positive for weakness. Negative for dizziness, tingling, tremors, sensory change, speech change, focal weakness and loss of consciousness.  Endo/Heme/Allergies: Does not bruise/bleed easily.  Psychiatric/Behavioral: Negative for substance abuse. The patient is not nervous/anxious.   All other systems reviewed and are negative.     Physical Exam/Data:   Vitals:   02/12/17 1130 02/12/17 1200 02/12/17 1230 02/12/17 1300  BP: (!) 146/101 (!) 159/116 (!) 170/116 (!) 170/101  Pulse: (!) 105 (!) 110 (!) 112 (!) 112  Resp:  (!) 31 (!) 31 (!) 9  Temp:      TempSrc:      SpO2: 93% 94% 96% 96%  Weight:      Height:       No intake or output data in the 24 hours ending 02/12/17 1319 Filed Weights   02/12/17 0852  Weight: 276 lb (125.2 kg)   Body mass index is 43.23 kg/m.   Physical Exam: General: Well developed, well nourished, in no acute distress. Head: Normocephalic, atraumatic, sclera non-icteric, no xanthomas, nares without discharge.  Neck: Negative for carotid  bruits. JVD difficult to assess 2/2 body habitus. Lungs: Diminished bilaterally with faint bibasilar crackles. Breathing is unlabored. Heart: Tachycardic with S1 S2. II/VI systolic murmur, no rubs, or gallops appreciated. Abdomen: Soft, non-tender, non-distended with normoactive bowel sounds. No hepatomegaly. No rebound/guarding. No obvious abdominal masses. Msk:  Strength and tone appear normal for age. Extremities: No clubbing or cyanosis. No edema. Distal pedal pulses are 2+ and equal bilaterally. Multiple tattoos and piercings noted.  Neuro: Alert and oriented X 3. No facial asymmetry. No focal deficit. Moves all extremities spontaneously. Psych:  Responds to questions appropriately with a normal affect.   EKG:  The EKG was personally reviewed and demonstrates: sinus tachycardia, 116 bpm, left anterior fascicular block, poor R wave progression, lateral TWI, LVH with nonspecific anterior st elevation likely secondary to early repolarization not meeting criteria for st elevation myocardial infarction  Telemetry:  Telemetry was personally reviewed and demonstrates: sinus tachycardia, 110s bpm  Weights: Filed Weights   02/12/17 0852  Weight: 276 lb (125.2 kg)    Relevant CV Studies: TTE 06/2014: Study Conclusions  - Left ventricle: The cavity size was mildly dilated. Systolic   function was mildly reduced. The estimated ejection fraction was   in the range of 45% to 50%. Wall motion was normal; there were no   regional wall motion abnormalities. - Aortic valve: There was mild regurgitation. - Mitral valve: There was mild regurgitation.  Laboratory Data:  Chemistry Recent Labs  Lab 02/12/17 0958  NA 134*  K 3.7  CL 99*  CO2 26  GLUCOSE 369*  BUN 22*  CREATININE 1.76*  CALCIUM 8.7*  GFRNONAA 48*  GFRAA 56*  ANIONGAP 9    Recent Labs  Lab 02/12/17 0958  PROT 6.9  ALBUMIN 3.0*  AST 25  ALT 16*  ALKPHOS 96  BILITOT 0.7   Hematology Recent Labs  Lab  02/12/17 0900  WBC 7.2  RBC 5.58  HGB 14.9  HCT 44.2  MCV 79.2*  MCH 26.7  MCHC 33.8  RDW 14.7*  PLT 312   Cardiac Enzymes Recent Labs  Lab 02/12/17 0900  TROPONINI 0.35*   No results for input(s): TROPIPOC in the last 168 hours.  BNP Recent Labs  Lab 02/12/17 0900  BNP 309.0*    DDimer No results for input(s): DDIMER in the last 168 hours.  Radiology/Studies:  Dg Chest  2 View  Result Date: 02/12/2017 IMPRESSION: 1. Cardiomegaly with vascular congestion and potential interstitial pulmonary edema. 2. Subtle ill-defined nodularity in the parahilar regions may be edema related although infection could have this appearance. Electronically Signed   By: Kennith Center M.D.   On: 02/12/2017 09:25    Assessment and Plan:   1. Elevated troponin/angina decubitus: -Mildly elevated at 0.35 in the ED, continue to cycle until peak -Likely supply demand ischemia in the setting of hypertensive emergency and CKD stage III -Hold heparin gtt for now unless there is dynamic elevation in troponin -Check echo -If echo shows a stable EF without WMA, would plan for nuclear stress test on 1/3 -If echo demonstrates worsening EF or WMA consistent with ischemia or if troponin trends significantly upwards, would plan for LHC with minimizing contrast (possibly a staged procedure) to limit contrast-induced nephropathy  -ASA -He refuses nitro 2/2 headache associated with this medication in the past -A1c and lipid panel pending for further risk stratification   2. Acute on chronic diastolic CHF with systolic dysfunction: -Continue IV Lasix with KCl repletion with close monitoring of renal function -As BP and renal function allow would escalate evidence-based heart failure regimen including possible ACEi/ARB, beta blocker(consider changing to Bystolic), and spironolactone (if renal function allows) -CHF education -Daily weights with strict Is and Os -Likely exacerbated by his poorly controlled  HTN  3. Hypertensive emergency/hypertensive heart disease: -Likely in the setting of medication noncompliance, off all medications for ~ 2 years -No prior secondary HTN work up -BP has improved in the ED into the 170s systolic, continue to titrate Lopressor -Add amlodipine 5 mg daily  -Lasix as above -Recommend secondary HTN work with renin/aldosterone ratio, AM cortisol and as an outpatient a 24 hour urine for metanephrines and catecholamines. He will also need renal artery ultrasound and a sleep study -TSH -Check urine drug scren  4. CKD stage III: -Baseline ~ 1.7-1.8 -Likely in the setting of longstanding poorly controlled hypertension -Monitor with diuresis -He would benefit from establishing with a nephrologist   5. DM2: -A1c pending -Per IM  6. Obesity/snoring: -Needs outpatient sleep study as above  7. Noncompliance presenting significant hazards to health: -Compliance is advised    For questions or updates, please contact [facility] Please consult [email or web address] for contact info under Cardiology/STEMI.   Signed, Eula Listen, PA-C [facility] Pager: [phone] 02/12/2017, 1:19 PM

## 2017-02-12 NOTE — H&P (Signed)
[facility] - [place] [facility]   PATIENT NAME: Joel [name]    MR#:  [id number]  DATE OF BIRTH:  02/23/1980  DATE OF ADMISSION:  02/12/2017  PRIMARY CARE PHYSICIAN: Patient, No Pcp Per   REQUESTING/REFERRING PHYSICIAN: Dr. Flavia Shipper  CHIEF COMPLAINT: Shortness of breath   Chief Complaint  Patient presents with  . Shortness of Breath    HISTORY OF PRESENT ILLNESS:  Joel [name]  is a 37 y.o. male with a known history of essential hypertension, diabetes mellitus type 2 not on medication for the past year  due to financial reasons comes in because of shortness of breath, weight gain up to 6 pounds in last 1 week.  Having shortness of breath for 2 weeks associated with orthopnea, PND.  Patient also complains of heaviness in the chest since 1 week.  No extremity edema.  Patient troponin up to 0.35 in the emergency room.  Chest x-ray showed pulmonary edema.  BNP is pending.  PAST MEDICAL HISTORY:   Past Medical History:  Diagnosis Date  . Diabetes mellitus without complication (HCC)   . Hypertension     PAST SURGICAL HISTOIRY:   Past Surgical History:  Procedure Laterality Date  . NO PAST SURGERIES      SOCIAL HISTORY:   Social History   Tobacco Use  . Smoking status: Current Some Day Smoker  . Smokeless tobacco: Never Used  Substance Use Topics  . Alcohol use: Yes    Alcohol/week: 1.2 oz    Types: 2 Cans of beer per week    FAMILY HISTORY:   Family History  Problem Relation Age of Onset  . Hypertension Mother   . Hyperlipidemia Mother   . CAD Father     DRUG ALLERGIES:   Allergies  Allergen Reactions  . Nitroglycerin Other (See Comments)    Increases blood pressure and pain.    REVIEW OF SYSTEMS:  CONSTITUTIONAL: No fever, fatigue or weakness.  EYES: No blurred or double vision.  EARS, NOSE, AND THROAT: No tinnitus or ear pain.  RESPIRATORY: Shortness of breath, weight gain,/ CARDIOVASCULAR: chest heaviness, orthopnea,  PND. GASTROINTESTINAL: No nausea, vomiting, diarrhea or abdominal pain.  GENITOURINARY: No dysuria, hematuria.  ENDOCRINE: No polyuria, nocturia,  HEMATOLOGY: No anemia, easy bruising or bleeding SKIN: No rash or lesion. MUSCULOSKELETAL: No joint pain or arthritis.   NEUROLOGIC: No tingling, numbness, weakness.  PSYCHIATRY: No anxiety or depression.   MEDICATIONS AT HOME:   Prior to Admission medications   Medication Sig Start Date End Date Taking? Authorizing Provider  aspirin-acetaminophen-caffeine (EXCEDRIN MIGRAINE) [phone] MG per tablet Take 0.5 tablets by mouth daily as needed for headache.    Yes [provider]  esomeprazole (NEXIUM) 20 MG capsule Take 20 mg by mouth daily as needed.   Yes [provider]  aspirin 81 MG chewable tablet Chew 1 tablet (81 mg total) by mouth daily. Patient not taking: Reported on 02/12/2017 10/13/14   Milagros Loll, MD  furosemide (LASIX) 20 MG tablet Take 1 tablet (20 mg total) by mouth daily. Patient not taking: Reported on 02/12/2017 10/13/14   Milagros Loll, MD  glipiZIDE (GLUCOTROL) 10 MG tablet Take 1 tablet (10 mg total) by mouth daily before breakfast. Patient not taking: Reported on 02/12/2017 10/13/14   Milagros Loll, MD  isosorbide mononitrate (IMDUR) 30 MG 24 hr tablet Take 1 tablet (30 mg total) by mouth daily. Patient not taking: Reported on 02/12/2017 10/13/14   Milagros Loll, MD  lisinopril-hydrochlorothiazide (  ZESTORETIC) 20-12.5 MG per tablet Take 1 tablet by mouth daily. Patient not taking: Reported on 02/12/2017 10/13/14   Milagros Loll, MD  metoprolol (LOPRESSOR) 100 MG tablet Take 1 tablet (100 mg total) by mouth 2 (two) times daily. Patient not taking: Reported on 02/12/2017 10/13/14   Milagros Loll, MD  potassium chloride (K-DUR) 10 MEQ tablet Take 1 tablet (10 mEq total) by mouth daily. Patient not taking: Reported on 02/12/2017 10/13/14   Milagros Loll, MD      VITAL SIGNS:  Blood pressure (!) 146/101, pulse (!) 105,  temperature 98.9 F (37.2 C), temperature source Oral, resp. rate 15, height 5\' 7"  (1.702 m), weight 125.2 kg (276 lb), SpO2 93 %.  PHYSICAL EXAMINATION:  GENERAL:  37 y.o.-year-old patient lying in the bed with no acute distress.  EYES: Pupils equal, round, reactive to light , No scleral icterus. Extraocular muscles intact.  HEENT: Head atraumatic, normocephalic. Oropharynx and nasopharynx clear.  NECK:  Supple, no jugular venous distention. No thyroid enlargement, no tenderness.  LUNGS: Diffuse decreased breath sounds bilaterally but no wheezing or rales. CARDIOVASCULAR: S1, S2 normal. No murmurs, rubs, or gallops.  ABDOMEN: Soft, nontender, nondistended. Bowel sounds present. No organomegaly or mass.  EXTREMITIES: No pedal edema, cyanosis, or clubbing.  NEUROLOGIC: Cranial nerves II through XII are intact. Muscle strength 5/5 in all extremities. Sensation intact. Gait not checked.  PSYCHIATRIC: The patient is alert and oriented x 3.  SKIN: No obvious rash, lesion, or ulcer.   LABORATORY PANEL:   CBC Recent Labs  Lab 02/12/17 0900  WBC 7.2  HGB 14.9  HCT 44.2  PLT 312   ------------------------------------------------------------------------------------------------------------------  Chemistries  Recent Labs  Lab 02/12/17 0958  NA 134*  K 3.7  CL 99*  CO2 26  GLUCOSE 369*  BUN 22*  CREATININE 1.76*  CALCIUM 8.7*  AST 25  ALT 16*  ALKPHOS 96  BILITOT 0.7   ------------------------------------------------------------------------------------------------------------------  Cardiac Enzymes Recent Labs  Lab 02/12/17 0900  TROPONINI 0.35*   ------------------------------------------------------------------------------------------------------------------  RADIOLOGY:  Dg Chest 2 View  Result Date: 02/12/2017 CLINICAL DATA:  Cough with shortness of breath. Diabetes with history of diastolic CHF. EXAM: CHEST  2 VIEW COMPARISON:  10/11/2014 FINDINGS: The cardio  pericardial silhouette is enlarged. Vascular congestion noted with subtle interstitial opacity. No pleural effusion. The visualized bony structures of the thorax are intact. IMPRESSION: 1. Cardiomegaly with vascular congestion and potential interstitial pulmonary edema. 2. Subtle ill-defined nodularity in the parahilar regions may be edema related although infection could have this appearance. Electronically Signed   By: Kennith Center M.D.   On: 02/12/2017 09:25    EKG:   Orders placed or performed during the hospital encounter of 02/12/17  . ED EKG within 10 minutes  . ED EKG within 10 minutes  EKG shows normal sinus rhythm with no ST-T changes.  IMPRESSION AND PLAN:   37 year old male patient with history of essential hypertension, diabetes mellitus type 2, systolic heart failure not on medicine for the past 1 year due to financial reasons comes in with shortness of breath, weight gain, orthopnea found to have heart failure by x-ray and symptoms. 1.  Acute on chronic systolic heart failure: Admit to telemetry, start IV Lasix, check daily weights, continue low-sodium diet, monitor urine output, cardiology consult, spoke with Dr. Dossie Arbour. 2.  Slightly elevated troponins likely demand ischemia due to heart failure.  Trend the troponins, wait for second troponin repeat is up start the heparin drip, continue aspirin, beta-blockers, patient  says he cannot take nitrates because of severe exploding headache with nitrates.  So hold the nitrates.  Obtain fasting lipids. 3.  Diabetes mellitus type 2, elevated blood sugar up to 300s due to not taking medicines, hemoglobin A1c, continue sliding scale insulin with coverage, obtain diabetic nurse consult, 4.  Chronic kidney disease stage III: Baseline creatinine is around 1.8/monitor closely. High risk for worsening renal failure due to contrast if he goes for cardiac cath.   All the records are reviewed and case discussed with ED provider. Management  plans discussed with the patient, family and they are in agreement.  CODE STATUS: full  TOTAL TIME TAKING CARE OF THIS PATIENT:62minutes.    Katha Hamming M.D on 02/12/2017 at 12:00 PM  Between 7am to 6pm - Pager - [phone]  After 6pm go to [email or web address] - password [email or web address]  [facility]  Office  [phone]  CC: Primary care physician; Patient, No Pcp Per  Note: This dictation was prepared with Dragon dictation along with smaller phrase technology. Any transcriptional errors that result from this process are unintentional.

## 2017-02-12 NOTE — ED Triage Notes (Signed)
Pt to ed with c/o sob, weight gain, and difficulty sleeping at night x 2 weeks.  Pt states "this feels just like when I had congestive heart failure"  Pt states heart failure diagnosis 2 years ago but has not seen an md since.

## 2017-02-12 NOTE — Progress Notes (Signed)
Inpatient Diabetes Program Recommendations  AACE/ADA: New Consensus Statement on Inpatient Glycemic Control (2015)  Target Ranges:  Prepandial:   less than 140 mg/dL      Peak postprandial:   less than 180 mg/dL (1-2 hours)      Critically ill patients:  140 - 180 mg/dL   Results for Joel [name], Joel [name] (MRN [id number]) as of 02/12/2017 14:17  Ref. Range 02/12/2017 13:41  Glucose-Capillary Latest Ref Range: 65 - 99 mg/dL 409 (H)   Admit with: Acute on chronic systolic heart failure  History: DM  Home DM Meds: No meds for 1 year due to financial reasons  Current Insulin Orders: Novolog Moderate Correction Scale/ SSI (0-15 units) TID AC        Note Hemoglobin A1c level pending.  Expect it to be elevated given patient has not taken any DM meds for over one year.  Per Record Review, hasn't seen his PCP since 04/28/16.  Was a No Show for his appointment on 04/24/16.  Was taking insulin back in 2017 per Dr. Earley Abide Endocrinology notes (05/31/2015).  Not sure when patient stopped taking insulin?    MD- May want to go ahead and order basal insulin for this patient since his CBG was 371 mg/dl on admission.   Recommend starting with Lantus 18 units daily (0.15 units/kg dosing)      --Will follow patient during hospitalization--  Ambrose Finland RN, MSN, CDE Diabetes Coordinator Inpatient Glycemic Control Team Team Pager: [phone] (8a-5p)

## 2017-02-12 NOTE — Care Management Note (Addendum)
Case Management Note  Patient Details  Name: Joel [name] MRN: [id number] Date of Birth: May 04, 1980  Subjective/Objective:                 last admission 2016, patient did not have a payor but was to start a new job with insurance.  He was "let go" 3 months ago because he was missing a lot of work due to his heart failure sx.  He and his wife looked into disability but "was told could not get it" because there were no medical records.  Patient admits he did not comply with follow up MD appointments. He has access to scales.  Lives with his wife who brings home around 1200 dollars a month.  There are no other dependents in the home.  Dicussed [facility] and the [facility] Clinics which are sliding scale. Access to working scales . Provided with Living with Heart Failure Education Booklet.Discussed the Heart Failure Clinic as a beneficial resource. Is not requiring supplemental oxygen   Action/Plan:  Provided patient with application for [facility].  Having an appointment made at [facility] to get established with pcp.  Anticipate assist with discharge medications.   Expected Discharge Date:  02/14/17               Expected Discharge Plan:     In-House Referral:     Discharge planning Services     Post Acute Care Choice:    Choice offered to:     DME Arranged:    DME Agency:     HH Arranged:    HH Agency:     Status of Service:     If discussed at [employer], dates discussed:    Additional Comments:  Eber Hong, RN 02/12/2017, 2:38 PM

## 2017-02-12 NOTE — ED Provider Notes (Addendum)
[facility] Emergency Department Provider Note   ____________________________________________   First MD Initiated Contact with Patient 02/12/17 [phone]     (approximate)  I have reviewed the triage vital signs and the nursing notes.   HISTORY  Chief Complaint Shortness of Breath   HPI Joel [name] is a 37 y.o. male Who reports he has a history of congestive heart failure from before. He's been gaining weight and getting more short of breath especially when he lays down. He feels like he did when he had his congestive heart failure. He has not been back to see the doctor since he was diagnosed with heart failure. He has some chest tightness as well.   Past Medical History:  Diagnosis Date  . Diabetes mellitus without complication (HCC)   . Hypertension     Patient Active Problem List   Diagnosis Date Noted  . CHF (congestive heart failure) (HCC) 10/11/2014  . Shortness of breath 10/11/2014  . Hypertensive urgency 06/13/2014    Past Surgical History:  Procedure Laterality Date  . NO PAST SURGERIES      Prior to Admission medications   Medication Sig Start Date End Date Taking? Authorizing Provider  aspirin-acetaminophen-caffeine (EXCEDRIN MIGRAINE) [phone] MG per tablet Take 0.5 tablets by mouth daily as needed for headache.    Yes [provider]  esomeprazole (NEXIUM) 20 MG capsule Take 20 mg by mouth daily as needed.   Yes [provider]  aspirin 81 MG chewable tablet Chew 1 tablet (81 mg total) by mouth daily. Patient not taking: Reported on 02/12/2017 10/13/14   Milagros Loll, MD  furosemide (LASIX) 20 MG tablet Take 1 tablet (20 mg total) by mouth daily. Patient not taking: Reported on 02/12/2017 10/13/14   Milagros Loll, MD  glipiZIDE (GLUCOTROL) 10 MG tablet Take 1 tablet (10 mg total) by mouth daily before breakfast. Patient not taking: Reported on 02/12/2017 10/13/14   Milagros Loll, MD  isosorbide mononitrate (IMDUR) 30  MG 24 hr tablet Take 1 tablet (30 mg total) by mouth daily. Patient not taking: Reported on 02/12/2017 10/13/14   Milagros Loll, MD  lisinopril-hydrochlorothiazide (ZESTORETIC) 20-12.5 MG per tablet Take 1 tablet by mouth daily. Patient not taking: Reported on 02/12/2017 10/13/14   Milagros Loll, MD  metoprolol (LOPRESSOR) 100 MG tablet Take 1 tablet (100 mg total) by mouth 2 (two) times daily. Patient not taking: Reported on 02/12/2017 10/13/14   Milagros Loll, MD  potassium chloride (K-DUR) 10 MEQ tablet Take 1 tablet (10 mEq total) by mouth daily. Patient not taking: Reported on 02/12/2017 10/13/14   Milagros Loll, MD    Allergies Nitroglycerin  Family History  Problem Relation Age of Onset  . Hypertension Mother   . Hyperlipidemia Mother   . CAD Father     Social History Social History   Tobacco Use  . Smoking status: Current Some Day Smoker  . Smokeless tobacco: Never Used  Substance Use Topics  . Alcohol use: Yes    Alcohol/week: 1.2 oz    Types: 2 Cans of beer per week  . Drug use: No    Review of Systems  Constitutional: No fever/chills Eyes: No visual changes. ENT: No sore throat. Cardiovascular:  chest pain. Respiratory:  shortness of breath. Gastrointestinal: No abdominal pain.  No nausea, no vomiting.  No diarrhea.  No constipation. Genitourinary: Negative for dysuria. Musculoskeletal: Negative for back pain. Skin: Negative for rash. Neurological: Negative for headaches, focal weakness  ____________________________________________   PHYSICAL EXAM:  VITAL SIGNS: ED Triage Vitals  Enc Vitals Group     BP 02/12/17 0851 (!) 213/146     Pulse Rate 02/12/17 0851 (!) 123     Resp 02/12/17 0851 20     Temp 02/12/17 0851 98.9 F (37.2 C)     Temp Source 02/12/17 0851 Oral     SpO2 02/12/17 0851 96 %     Weight 02/12/17 0852 276 lb (125.2 kg)     Height 02/12/17 0852 5\' 7"  (1.702 m)     Head Circumference --      Peak Flow --      Pain Score --      Pain Loc --       Pain Edu? --      Excl. in GC? --     Constitutional: Alert and oriented. Well appearing and in no acute distress. Eyes: Conjunctivae are normal.  Head: Atraumatic. Nose: No congestion/rhinnorhea. Mouth/Throat: Mucous membranes are moist.  Oropharynx non-erythematous. Neck: No stridor. Cardiovascular: Normal rate, regular rhythm. Grossly normal heart sounds.  Good peripheral circulation. Respiratory: Normal respiratory effort.  No retractions. Lungs CTAB. Gastrointestinal: Soft and nontender. No distention. No abdominal bruits. No CVA tenderness. Musculoskeletal: No lower extremity tenderness nor edema.  No joint effusions. Neurologic:  Normal speech and language. No gross focal neurologic deficits are appreciated.  skin: dry and intact. No rash noted. Psychiatric: Mood and affect are normal. Speech and behavior are normal.  ____________________________________________   LABS (all labs ordered are listed, but only abnormal results are displayed)  Labs Reviewed  CBC - Abnormal; Notable for the following components:      Result Value   MCV 79.2 (*)    RDW 14.7 (*)    All other components within normal limits  TROPONIN I - Abnormal; Notable for the following components:   Troponin I 0.35 (*)    All other components within normal limits  COMPREHENSIVE METABOLIC PANEL - Abnormal; Notable for the following components:   Sodium 134 (*)    Chloride 99 (*)    Glucose, Bld 369 (*)    BUN 22 (*)    Creatinine, Ser 1.76 (*)    Calcium 8.7 (*)    Albumin 3.0 (*)    ALT 16 (*)    GFR calc non Af Amer 48 (*)    GFR calc Af Amer 56 (*)    All other components within normal limits  BRAIN NATRIURETIC PEPTIDE   ____________________________________________  EKG  EKG read and interpreted by me shows sinus tachycardia rate of 119 left axis dflipped T's in 1 and L I do not have any old EKGs I can compare to ____________________________________________  RADIOLOGY  Dg Chest 2  View  Result Date: 02/12/2017 CLINICAL DATA:  Cough with shortness of breath. Diabetes with history of diastolic CHF. EXAM: CHEST  2 VIEW COMPARISON:  10/11/2014 FINDINGS: The cardio pericardial silhouette is enlarged. Vascular congestion noted with subtle interstitial opacity. No pleural effusion. The visualized bony structures of the thorax are intact. IMPRESSION: 1. Cardiomegaly with vascular congestion and potential interstitial pulmonary edema. 2. Subtle ill-defined nodularity in the parahilar regions may be edema related although infection could have this appearance. Electronically Signed   By: Kennith Center M.D.   On: 02/12/2017 09:25   congestive heart failure on the chest x-ray ____________________________________________   PROCEDURES  Procedure(s) performed:   Procedures  Critical Care performed:   ____________________________________________   INITIAL IMPRESSION / ASSESSMENT AND PLAN / ED COURSE  a shouldn't BNP is still pending his troponin is significantly elevated EKG is not normal he does have chest pain and congestive failure on chest x-ray I will plan on putting him in the hospital. I guarded given him enalapril and Lasix IV and his heart rate has come down from 116-109 blood pressures improved as well.      ____________________________________________   FINAL CLINICAL IMPRESSION(S) / ED DIAGNOSES  Final diagnoses:  Systolic congestive heart failure, unspecified HF chronicity (HCC)  Elevated troponin  Elevated glucose     ED Discharge Orders    None       Note:  This document was prepared using Dragon voice recognition software and may include unintentional dictation errors.    Arnaldo Natal, MD 02/12/17 1119    Arnaldo Natal, MD 02/12/17 [id number]

## 2017-02-12 NOTE — ED Notes (Signed)
Date and time results received: 02/12/17 1110 (use smartphrase ".now" to insert current time)  Test: troponin Critical Value: 0.35  Name of Provider Notified: Dr. Darnelle Catalan  Orders Received? Or Actions Taken?: Orders Received - See Orders for details

## 2017-02-12 NOTE — Progress Notes (Signed)
Anticoagulation monitoring(Lovenox):  36yo  M ordered Lovenox 40 mg Q24h  Filed Weights   02/12/17 0852  Weight: 276 lb (125.2 kg)   BMI 43.22   Lab Results  Component Value Date   CREATININE 1.76 (H) 02/12/2017   CREATININE 2.26 (H) 10/13/2014   CREATININE 2.00 (H) 10/12/2014   Estimated Creatinine Clearance: 74.3 mL/min (A) (by C-G formula based on SCr of 1.76 mg/dL (H)). Hemoglobin & Hematocrit     Component Value Date/Time   HGB 14.9 02/12/2017 0900   HGB 14.1 06/11/2014 1323   HCT 44.2 02/12/2017 0900   HCT 42.0 06/11/2014 1323     Per Protocol for Patient with estCrcl > 30 ml/min and BMI > 40, will transition to Lovenox 40 mg Q12h.      Bari Mantis PharmD Clinical Pharmacist 02/12/2017

## 2017-02-13 ENCOUNTER — Inpatient Hospital Stay (HOSPITAL_BASED_OUTPATIENT_CLINIC_OR_DEPARTMENT_OTHER): Payer: Medicaid Other

## 2017-02-13 DIAGNOSIS — I160 Hypertensive urgency: Secondary | ICD-10-CM

## 2017-02-13 DIAGNOSIS — I110 Hypertensive heart disease with heart failure: Secondary | ICD-10-CM

## 2017-02-13 DIAGNOSIS — I248 Other forms of acute ischemic heart disease: Secondary | ICD-10-CM

## 2017-02-13 DIAGNOSIS — R748 Abnormal levels of other serum enzymes: Secondary | ICD-10-CM

## 2017-02-13 LAB — BASIC METABOLIC PANEL
Anion gap: 10 (ref 5–15)
BUN: 27 mg/dL — ABNORMAL HIGH (ref 6–20)
CO2: 25 mmol/L (ref 22–32)
Calcium: 8.6 mg/dL — ABNORMAL LOW (ref 8.9–10.3)
Chloride: 98 mmol/L — ABNORMAL LOW (ref 101–111)
Creatinine, Ser: 2.04 mg/dL — ABNORMAL HIGH (ref 0.61–1.24)
GFR calc Af Amer: 47 mL/min — ABNORMAL LOW (ref >60)
GFR calc non Af Amer: 41 mL/min — ABNORMAL LOW (ref >60)
Glucose, Bld: 371 mg/dL — ABNORMAL HIGH (ref 65–99)
Potassium: 3.4 mmol/L — ABNORMAL LOW (ref 3.5–5.1)
Sodium: 133 mmol/L — ABNORMAL LOW (ref 135–145)

## 2017-02-13 LAB — GLUCOSE, CAPILLARY
Glucose-Capillary: 303 mg/dL — ABNORMAL HIGH (ref 65–99)
Glucose-Capillary: 308 mg/dL — ABNORMAL HIGH (ref 65–99)
Glucose-Capillary: 280 mg/dL — ABNORMAL HIGH (ref 65–99)
Glucose-Capillary: 260 mg/dL — ABNORMAL HIGH (ref 65–99)

## 2017-02-13 LAB — NM MYOCAR MULTI W/SPECT W/WALL MOTION / EF
LV dias vol: 212 mL (ref 62–150)
LV sys vol: 152 mL
Peak HR: 108 {beats}/min
Percent HR: 58 %
Rest HR: 89 {beats}/min
SDS: 5
SRS: 8
SSS: 13
TID: 0.96

## 2017-02-13 LAB — LIPID PANEL
Cholesterol: 435 mg/dL — ABNORMAL HIGH (ref 0–200)
HDL: 30 mg/dL — ABNORMAL LOW (ref >40)
LDL Cholesterol: 352 mg/dL — ABNORMAL HIGH (ref 0–99)
Total CHOL/HDL Ratio: 14.5 RATIO
Triglycerides: 267 mg/dL — ABNORMAL HIGH (ref <150)
VLDL: 53 mg/dL — ABNORMAL HIGH (ref 0–40)

## 2017-02-13 LAB — CBC
HCT: 39.3 % — ABNORMAL LOW (ref 40.0–52.0)
Hemoglobin: 13.4 g/dL (ref 13.0–18.0)
MCH: 26.9 pg (ref 26.0–34.0)
MCHC: 34 g/dL (ref 32.0–36.0)
MCV: 79.2 fL — ABNORMAL LOW (ref 80.0–100.0)
Platelets: 291 10*3/uL (ref 150–440)
RBC: 4.96 MIL/uL (ref 4.40–5.90)
RDW: 14.5 % (ref 11.5–14.5)
WBC: 7.2 10*3/uL (ref 3.8–10.6)

## 2017-02-13 LAB — HIV ANTIBODY (ROUTINE TESTING W REFLEX): HIV Screen 4th Generation wRfx: NONREACTIVE

## 2017-02-13 LAB — ECHOCARDIOGRAM COMPLETE
Height: 67 in
Weight: 4459.2 oz

## 2017-02-13 LAB — CORTISOL-AM, BLOOD: Cortisol - AM: 9.5 ug/dL (ref 6.7–22.6)

## 2017-02-13 LAB — TROPONIN I: Troponin I: 0.3 ng/mL (ref <0.03)

## 2017-02-13 MED ORDER — INSULIN ASPART 100 UNIT/ML ~~LOC~~ SOLN
0.0000 [IU] | Freq: Three times a day (TID) | SUBCUTANEOUS | Status: DC
  Administered 2017-02-14: 7 [IU] via SUBCUTANEOUS
  Filled 2017-02-13: qty 1

## 2017-02-13 MED ORDER — CARVEDILOL 12.5 MG PO TABS
12.5000 mg | ORAL_TABLET | Freq: Once | ORAL | Status: AC
  Administered 2017-02-13: 12.5 mg via ORAL
  Filled 2017-02-13: qty 1

## 2017-02-13 MED ORDER — CARVEDILOL 12.5 MG PO TABS
12.5000 mg | ORAL_TABLET | Freq: Two times a day (BID) | ORAL | Status: DC
  Administered 2017-02-13: 12.5 mg via ORAL
  Filled 2017-02-13: qty 1

## 2017-02-13 MED ORDER — INSULIN ASPART 100 UNIT/ML ~~LOC~~ SOLN
0.0000 [IU] | Freq: Every day | SUBCUTANEOUS | Status: DC
  Administered 2017-02-13: 3 [IU] via SUBCUTANEOUS
  Filled 2017-02-13: qty 1

## 2017-02-13 MED ORDER — TECHNETIUM TC 99M TETROFOSMIN IV KIT
13.0000 | PACK | Freq: Once | INTRAVENOUS | Status: AC | PRN
  Administered 2017-02-13: 13.94 via INTRAVENOUS

## 2017-02-13 MED ORDER — REGADENOSON 0.4 MG/5ML IV SOLN
0.4000 mg | Freq: Once | INTRAVENOUS | Status: AC
  Administered 2017-02-13: 0.4 mg via INTRAVENOUS
  Filled 2017-02-13: qty 5

## 2017-02-13 MED ORDER — TECHNETIUM TC 99M TETROFOSMIN IV KIT
30.0000 | PACK | Freq: Once | INTRAVENOUS | Status: AC | PRN
  Administered 2017-02-13: 31.82 via INTRAVENOUS

## 2017-02-13 MED ORDER — INSULIN GLARGINE 100 UNIT/ML ~~LOC~~ SOLN
18.0000 [IU] | Freq: Every day | SUBCUTANEOUS | Status: DC
  Administered 2017-02-13: 18 [IU] via SUBCUTANEOUS
  Filled 2017-02-13 (×2): qty 0.18

## 2017-02-13 MED ORDER — CARVEDILOL 25 MG PO TABS
25.0000 mg | ORAL_TABLET | Freq: Two times a day (BID) | ORAL | Status: DC
  Administered 2017-02-14: 25 mg via ORAL
  Filled 2017-02-13: qty 1

## 2017-02-13 MED ORDER — PREMIER PROTEIN SHAKE
11.0000 [oz_av] | Freq: Two times a day (BID) | ORAL | Status: DC
  Administered 2017-02-13: 11 [oz_av] via ORAL

## 2017-02-13 MED ORDER — ATORVASTATIN CALCIUM 20 MG PO TABS
80.0000 mg | ORAL_TABLET | Freq: Every day | ORAL | Status: DC
  Administered 2017-02-13: 80 mg via ORAL
  Filled 2017-02-13: qty 4

## 2017-02-13 NOTE — Progress Notes (Signed)
Nutrition Education Note  RD consulted for nutrition education regarding new onset CHF.  37 y.o. male with history of systolic dysfunction, chronic diastolic CHF, CKD stage III-IV, hypertensive heart disease, DM, HLD, medication noncompliance, obesity, and snoring with possible undiagnosed sleep apenawho is being seen today for the evaluation of elevated troponin/acute on chronic diastolic CHF with systolic dysfunction and hypertensive emergency.  RD provided "Low Sodium Nutrition Therapy" handout from the Academy of [employer] and [employer]etics. Reviewed patient's dietary recall. Provided examples on ways to decrease sodium intake in diet. Discouraged intake of processed foods and use of salt shaker. Encouraged fresh fruits and vegetables as well as whole grain sources of carbohydrates to maximize fiber intake.   RD discussed why it is important for patient to adhere to diet recommendations, and emphasized the role of fluids, foods to avoid, and importance of weighing self daily.   RD also provided "Nutrition with Type II Diabetes" handout from the Academy of Nutrition and Dietetics. Discussed different food groups and their effects on blood sugar, emphasizing carbohydrate-containing foods. Provided list of carbohydrates and recommended serving sizes of common foods.  Discussed importance of controlled and consistent carbohydrate intake throughout the day. Provided examples of ways to balance meals/snacks and encouraged intake of high-fiber, whole grain complex carbohydrates.   Teach back method used.  Expect good compliance.  Body mass index is 34.18 kg/m. Pt meets criteria for obesity based on current BMI. Pt reports intentional weight loss of 100lbs over the past few years via portion control.   Current diet order is HH, patient is consuming approximately 100% of meals at this time. Labs and medications reviewed.   RD will order Premier Protein BID, each supplement provides 160 kcal and 30  grams of protein.   No further nutrition interventions warranted at this time. RD contact information provided. If additional nutrition issues arise, please re-consult RD.   Betsey Holiday MS, RD, LDN Pager #- [phone] After Hours Pager: [phone]

## 2017-02-13 NOTE — Progress Notes (Signed)
[employer] - [place] at [facility]                                                                                                                                                                                  Patient Demographics   Joel [name], is a 37 y.o. male, DOB - 1980/05/07, VVO:[id number]  Admit date - 02/12/2017   Admitting Physician Katha Hamming, MD  Outpatient Primary MD for the patient is Patient, No Pcp Per   LOS - 1  Subjective: Patient admitted with shortness of breath and chest pressure He states that his breathing is improved. He was not taking any medications at home He has been noncompliant with his medications and follow-up    Review of Systems:   CONSTITUTIONAL: No documented fever. No fatigue, weakness. No weight gain, no weight loss.  EYES: No blurry or double vision.  ENT: No tinnitus. No postnasal drip. No redness of the oropharynx.  RESPIRATORY: No cough, no wheeze, no hemoptysis. No dyspnea.  CARDIOVASCULAR: No chest pain. No orthopnea. No palpitations. No syncope.  GASTROINTESTINAL: No nausea, no vomiting or diarrhea. No abdominal pain. No melena or hematochezia.  GENITOURINARY: No dysuria or hematuria.  ENDOCRINE: No polyuria or nocturia. No heat or cold intolerance.  HEMATOLOGY: No anemia. No bruising. No bleeding.  INTEGUMENTARY: No rashes. No lesions.  MUSCULOSKELETAL: No arthritis. No swelling. No gout.  NEUROLOGIC: No numbness, tingling, or ataxia. No seizure-type activity.  PSYCHIATRIC: No anxiety. No insomnia. No ADD.    Vitals:   Vitals:   02/12/17 1714 02/12/17 1932 02/13/17 0523 02/13/17 0753  BP: (!) 144/102 131/83 127/83 119/81  Pulse: 92 94 90 90  Resp: 16 18  18   Temp: 99.3 F (37.4 C) 98.6 F (37 C) 97.9 F (36.6 C) 98.6 F (37 C)  TempSrc: Oral Oral Oral Oral  SpO2: 98% 97% 97% 99%  Weight:   275 lb 4.8 oz (124.9 kg)   Height:        Wt Readings from Last 3 Encounters:  02/13/17 275 lb 4.8 oz  (124.9 kg)  10/13/14 299 lb 9.6 oz (135.9 kg)  06/11/14 288 lb (130.6 kg)     Intake/Output Summary (Last 24 hours) at 02/13/2017 1438 Last data filed at 02/12/2017 1851 Gross per 24 hour  Intake 240 ml  Output 350 ml  Net -110 ml    Physical Exam:   GENERAL: Pleasant-appearing in no apparent distress.  HEAD, EYES, EARS, NOSE AND THROAT: Atraumatic, normocephalic. Extraocular muscles are intact. Pupils equal and reactive to light. Sclerae anicteric. No conjunctival injection. No oro-pharyngeal erythema.  NECK: Supple. There is no jugular venous distention. No bruits, no lymphadenopathy, no  thyromegaly.  HEART: Regular rate and rhythm,. No murmurs, no rubs, no clicks.  LUNGS: Clear to auscultation bilaterally. No rales or rhonchi. No wheezes.  ABDOMEN: Soft, flat, nontender, nondistended. Has good bowel sounds. No hepatosplenomegaly appreciated.  EXTREMITIES: No evidence of any cyanosis, clubbing, or peripheral edema.  +2 pedal and radial pulses bilaterally.  NEUROLOGIC: The patient is alert, awake, and oriented x3 with no focal motor or sensory deficits appreciated bilaterally.  SKIN: Moist and warm with no rashes appreciated.  Psych: Not anxious, depressed LN: No inguinal LN enlargement    Antibiotics   Anti-infectives (From admission, onward)   None      Medications   Scheduled Meds: . aspirin EC  81 mg Oral Daily  . atorvastatin  80 mg Oral q1800  . carvedilol  12.5 mg Oral BID WC  . docusate sodium  100 mg Oral BID  . enoxaparin (LOVENOX) injection  40 mg Subcutaneous Q12H  . Influenza vac split quadrivalent PF  0.5 mL Intramuscular Tomorrow-1000  . insulin aspart  0-15 Units Subcutaneous TID WC  . insulin glargine  18 Units Subcutaneous Daily  . losartan  50 mg Oral Daily  . pneumococcal 23 valent vaccine  0.5 mL Intramuscular Tomorrow-1000  . protein supplement shake  11 oz Oral BID BM  . sodium chloride flush  3 mL Intravenous Q12H   Continuous Infusions: PRN  Meds:.acetaminophen **OR** acetaminophen, bisacodyl, ondansetron **OR** ondansetron (ZOFRAN) IV   Data Review:   Micro Results No results found for this or any previous visit (from the past 240 hour(s)).  Radiology Reports Dg Chest 2 View  Result Date: 02/12/2017 CLINICAL DATA:  Cough with shortness of breath. Diabetes with history of diastolic CHF. EXAM: CHEST  2 VIEW COMPARISON:  10/11/2014 FINDINGS: The cardio pericardial silhouette is enlarged. Vascular congestion noted with subtle interstitial opacity. No pleural effusion. The visualized bony structures of the thorax are intact. IMPRESSION: 1. Cardiomegaly with vascular congestion and potential interstitial pulmonary edema. 2. Subtle ill-defined nodularity in the parahilar regions may be edema related although infection could have this appearance. Electronically Signed   By: Kennith Center M.D.   On: 02/12/2017 09:25   Nm Myocar Multi W/spect W/wall Motion / Ef  Result Date: 02/13/2017 Pharmacological myocardial perfusion imaging study with no significant ischemia Fixed perfusion defects in the mid anterior wall and inferoapical region consistent with attenuation artifact Global hypokinesis,  EF estimated at 17% No significant EKG changes concerning for ischemia at peak stress or in recovery. Moderate risk scan secondary to depressed cardiac function. Signed, Dossie Arbour, MD, Ph.D [facility]     CBC Recent Labs  Lab 02/12/17 0900 02/13/17 0416  WBC 7.2 7.2  HGB 14.9 13.4  HCT 44.2 39.3*  PLT 312 291  MCV 79.2* 79.2*  MCH 26.7 26.9  MCHC 33.8 34.0  RDW 14.7* 14.5    Chemistries  Recent Labs  Lab 02/12/17 0958 02/13/17 0416  NA 134* 133*  K 3.7 3.4*  CL 99* 98*  CO2 26 25  GLUCOSE 369* 371*  BUN 22* 27*  CREATININE 1.76* 2.04*  CALCIUM 8.7* 8.6*  AST 25  --   ALT 16*  --   ALKPHOS 96  --   BILITOT 0.7  --     ------------------------------------------------------------------------------------------------------------------ estimated creatinine clearance is 64.1 mL/min (A) (by C-G formula based on SCr of 2.04 mg/dL (H)). ------------------------------------------------------------------------------------------------------------------ Recent Labs    02/12/17 1347  HGBA1C 14.7*   ------------------------------------------------------------------------------------------------------------------ Recent Labs    02/13/17 0416  CHOL 435*  HDL 30*  LDLCALC 352*  TRIG 267*  CHOLHDL 14.5   ------------------------------------------------------------------------------------------------------------------ Recent Labs    02/12/17 1722  TSH 2.704   ------------------------------------------------------------------------------------------------------------------ No results for input(s): VITAMINB12, FOLATE, FERRITIN, TIBC, IRON, RETICCTPCT in the last 72 hours.  Coagulation profile No results for input(s): INR, PROTIME in the last 168 hours.  No results for input(s): DDIMER in the last 72 hours.  Cardiac Enzymes Recent Labs  Lab 02/12/17 1347 02/12/17 1722 02/12/17 2354  TROPONINI 0.34* 0.37* 0.30*   ------------------------------------------------------------------------------------------------------------------ Invalid input(s): POCBNP    Assessment & Plan   37 year old male patient with history of essential hypertension, diabetes mellitus type 2, systolic heart failure not on medicine for the past 1 year due to financial reasons comes in with shortness of breath, weight gain, orthopnea found to have heart failure by x-ray and symptoms. 1.  Acute on chronic systolic heart failure: Given IV Lasix patient's renal function worsens are now on oral Lasix, repeat BMP in the morning 2.  Slightly elevated troponins likely demand ischemia due to heart failure.    Patient status post stress test  with a small defect no further workup needed 3.  Diabetes mellitus type 2, elevated blood sugar up to 300s due to not taking medicines,  Patient started on Lantus, will need this on discharge Appreciate diabetic coordinator input 4.  Chronic kidney disease stage III: Baseline creatinine is around 1.8/worsened with IV Lasix monitor on oral Lasix 5.  Accelerated hypertension continue therapy with Coreg, losartan 6.  Hyperlipidemia with a very high HDL continue high-dose Lipitor        Code Status Orders  (From admission, onward)        Start     Ordered   02/12/17 1128  Full code  Continuous     02/12/17 1131    Code Status History    Date Active Date Inactive Code Status Order ID Comments User Context   10/11/2014 23:46 10/13/2014 15:34 Full Code [id number]  Houston Siren, MD Inpatient   06/11/2014 23:45 06/13/2014 15:11 Full Code [id number]  Charissa Bash, RN Inpatient           Consults cardiology  DVT Prophylaxis  Lovenox  Lab Results  Component Value Date   PLT 291 02/13/2017     Time Spent in minutes    Greater than 50% of time spent in care coordination and counseling patient regarding the condition and plan of care.   [name] M.D on 02/13/2017 at 2:38 PM  Between 7am to 6pm - Pager - [phone]  After 6pm go to [email or web address] - password [email or web address] [facility]  [facility] [place] [facility]   Office  [phone]

## 2017-02-13 NOTE — Progress Notes (Signed)
Inpatient Diabetes Program Recommendations  AACE/ADA: New Consensus Statement on Inpatient Glycemic Control (2015)  Target Ranges:  Prepandial:   less than 140 mg/dL      Peak postprandial:   less than 180 mg/dL (1-2 hours)      Critically ill patients:  140 - 180 mg/dL   Lab Results  Component Value Date   GLUCAP 212 (H) 02/12/2017   HGBA1C 14.7 (H) 02/12/2017    Review of Glycemic Control  Results for KETCHER, LOTTMAN (MRN [id number]) as of 02/13/2017 08:28  Ref. Range 02/12/2017 13:41 02/12/2017 17:11 02/12/2017 20:57 02/13/2017 07:54  Glucose-Capillary Latest Ref Range: 65 - 99 mg/dL 975 (H) 300 (H) 511 (H) 303 (H)   Home DM Meds: No meds for 1 year due to financial reasons  Current Insulin Orders: Novolog Moderate Correction Scale/ SSI (0-15 units) TID AC    Fasting blood sugar 303mg /dl- recommend starting with Lantus 18 units daily (0.15 units/kg dosing),  add Novolog 0-5 units qhs, and when he is able to eat, add Novolog 5 units tid (hold if he eats less than 50%.    **Care management will need to assist patient in getting discharge insulin- MD please check with case management to determine what rapid acting and what long acting insulins are available at Medication Management so the exact order can be written at discharge.   Susette Racer, RN, BA, MHA, CDE Diabetes Coordinator Inpatient Diabetes Program  [phone] (Team Pager) [phone] [facility] Office) 02/13/2017 7:59 AM

## 2017-02-13 NOTE — Progress Notes (Signed)
37 year old male with hx of systolic dysfunction, chronic diastolic CHF, CKD Stage III - IV, HTN, DM, HLD, medication non-compliance, obesity, snoring (needs outpatient sleep study for OSA).  Patient admitted with dx of elevated troponins/acute on chronic diastolic CHF with systolic dysfunction and hypertensive emergency.  Patient underwent Lexiscan Myoview today.  Echo performed 02/12/2017.  Results are pending.  Preliminary per Eula Listen, PA-C EF of 25-30% (prior 45-50% in 2016).     CHF Education:?? Educational session with patient completed.  Patient had just returned from [place] and was walking around the room.  Significant other at bedside.   ? Provided patient with "Living Better with Heart Failure" packet. Briefly reviewed definition of heart failure and signs and symptoms of an exacerbation.?Discussed the meaning of EF and his preliminary value as compared to normal value.  Explained to patient that HF is a chronic illness which requires self-assessment / self-management along with help from the cardiologist/PCP/HF Clinic.  ?  ?*Reviewed importance of and reason behind checking weight daily in the AM, after using the bathroom, but before getting dressed. Patient has scales.  Patient informed this RN that he does weigh himself every morning and that was one indicator for him that something was not right.  Encouraged patient to continue weighing himself again and explained the importance of doing so along with assessing his symptoms.   ? Reviewed the following information with patient:  *Discussed when to call the Dr= weight gain of >2-3lb overnight of 5lb in a week,  *Discussed yellow zone= call MD: weight gain of >2-3lb overnight of 5lb in a week, increased swelling, increased SOB when lying down, chest discomfort, dizziness, increased fatigue *Red Zone= call 911: struggle to breath, fainting or near fainting, significant chest pain  ? *Reviewed low sodium diet-provided handout of recommended  and not recommended foods.  Reviewed reading labels with patient. Discussed fluid intake with patient as well. Patient not currently on a fluid restriction, but advised no more than 8-8 ounces glass of fluids per day.?Note:  Dietitian has seen and educated patient on low sodium heart healthy carb modified diet.    *Instructed patient to take medications as prescribed for heart failure. Explained briefly why pt is on the medications (either make you feel better, live longer or keep you out of the hospital) and discussed monitoring and side effects.   *Smoking Cessation?- Patient is a former smoker.?Patient informed this RN that he does not smoke.   ? *Discussed the benefits of exercise.  Patient encouraged to remain as active as possible.  Explained to patient and significant other that based on his EF per Medicare guidelines he is a candidate for Cardiac Rehab.  However, patient has no payor source.  Patient informed this RN that he is unable to pay out-of-pocket for Cardiac Rehab.  Exercise encouraged.  Encourage walking and gradually increasing to 30 minutes per day up to 150  Minutes per week per AHA guidelines.    *[facility] Failure [facility]- Explained the role of the [facility] Failure [facility].  Explained to patient and significant other that the HF Clinic does not replace his PCP/cardiologist, but is an additional resource to help him manage his HF and to keep him out of the hospital.  Patient encouraged to keep this appointment.  Appointment in the HF Clinic is scheduled for 02/21/2017 at 8:40 a.m.    Once again reviewed the 5 Steps to Living Better with Heart Failure. ?Patient and significant other thanked me for providing  the above information. ?  Army Melia, RN, BSN, [facility] Nurse Navigator

## 2017-02-13 NOTE — Progress Notes (Signed)
MD Caryn Bee notified of pt's high BP, an order for 12.5mg  of Coreg once was obtained and another order to start Coreg 25 Mg BID. Will continue to monitor.

## 2017-02-13 NOTE — Progress Notes (Addendum)
Progress Note  Patient Name: WESTER TERREL Date of Encounter: 02/13/2017  Primary Cardiologist: New to [facility] - consult by Devun Anna  Subjective   No chest pain. SOB much improved. Able to lay fully supine overnight without issues. For Lexiscan Myoview this morning. Echo with preliminary report of EF 25-30%. BP much improved to the 110s/80s. Renal function bumped from 1.76-->2.04 (baseline ~ 1.7). Potassium 3.7-->3.4. Glucose remains elevated in the 300s. TC 435, TG 267, LDL 352. Cortisol and renin/aldosterone ratio pending. Documented UOP of 100 mL. Documented weight down 3 pounds to 275 pounds.   Inpatient Medications    Scheduled Meds: . aspirin EC  81 mg Oral Daily  . atorvastatin  40 mg Oral q1800  . carvedilol  25 mg Oral BID WC  . docusate sodium  100 mg Oral BID  . enoxaparin (LOVENOX) injection  40 mg Subcutaneous Q12H  . furosemide  40 mg Intravenous Q12H  . Influenza vac split quadrivalent PF  0.5 mL Intramuscular Tomorrow-1000  . insulin aspart  0-15 Units Subcutaneous TID WC  . losartan  50 mg Oral Daily  . pneumococcal 23 valent vaccine  0.5 mL Intramuscular Tomorrow-1000  . regadenoson  0.4 mg Intravenous Once  . sodium chloride flush  3 mL Intravenous Q12H   Continuous Infusions:  PRN Meds: acetaminophen **OR** acetaminophen, bisacodyl, ondansetron **OR** ondansetron (ZOFRAN) IV   Vital Signs    Vitals:   02/12/17 1714 02/12/17 1932 02/13/17 0523 02/13/17 0753  BP: (!) 144/102 131/83 127/83 119/81  Pulse: 92 94 90 90  Resp: 16 18  18   Temp: 99.3 F (37.4 C) 98.6 F (37 C) 97.9 F (36.6 C) 98.6 F (37 C)  TempSrc: Oral Oral Oral Oral  SpO2: 98% 97% 97% 99%  Weight:   275 lb 4.8 oz (124.9 kg)   Height:        Intake/Output Summary (Last 24 hours) at 02/13/2017 0932 Last data filed at 02/12/2017 1851 Gross per 24 hour  Intake 240 ml  Output 350 ml  Net -110 ml   Filed Weights   02/12/17 0852 02/12/17 1335 02/13/17 0523  Weight: 276 lb (125.2 kg) 278  lb 11.2 oz (126.4 kg) 275 lb 4.8 oz (124.9 kg)    Telemetry    NSR - Personally Reviewed  ECG    n/a - Personally Reviewed  Physical Exam   GEN: No acute distress.   Neck: No JVD. Cardiac: RRR, no murmurs, rubs, or gallops.  Respiratory: Clear to auscultation bilaterally.  GI: Soft, nontender, non-distended.   MS: No edema; No deformity. Neuro:  Alert and oriented x 3; Nonfocal.  Psych: Normal affect.  Labs    Chemistry Recent Labs  Lab 02/12/17 0958 02/13/17 0416  NA 134* 133*  K 3.7 3.4*  CL 99* 98*  CO2 26 25  GLUCOSE 369* 371*  BUN 22* 27*  CREATININE 1.76* 2.04*  CALCIUM 8.7* 8.6*  PROT 6.9  --   ALBUMIN 3.0*  --   AST 25  --   ALT 16*  --   ALKPHOS 96  --   BILITOT 0.7  --   GFRNONAA 48* 41*  GFRAA 56* 47*  ANIONGAP 9 10     Hematology Recent Labs  Lab 02/12/17 0900 02/13/17 0416  WBC 7.2 7.2  RBC 5.58 4.96  HGB 14.9 13.4  HCT 44.2 39.3*  MCV 79.2* 79.2*  MCH 26.7 26.9  MCHC 33.8 34.0  RDW 14.7* 14.5  PLT 312 291  Cardiac Enzymes Recent Labs  Lab 02/12/17 0900 02/12/17 1347 02/12/17 1722 02/12/17 2354  TROPONINI 0.35* 0.34* 0.37* 0.30*   No results for input(s): TROPIPOC in the last 168 hours.   BNP Recent Labs  Lab 02/12/17 0900  BNP 309.0*     DDimer No results for input(s): DDIMER in the last 168 hours.   Radiology    Dg Chest 2 View  Result Date: 02/12/2017 IMPRESSION: 1. Cardiomegaly with vascular congestion and potential interstitial pulmonary edema. 2. Subtle ill-defined nodularity in the parahilar regions may be edema related although infection could have this appearance. Electronically Signed   By: Kennith Center M.D.   On: 02/12/2017 09:25    Cardiac Studies   TTE pending  Lexiscan pending  Patient Profile     37 y.o. male with history of systolic dysfunction, chronic diastolic CHF, CKD stage III-IV, hypertensive heart disease, DM, HLD, medication noncompliance, obesity, and snoring with possible  undiagnosed sleep apena who is being seen today for the evaluation of elevated troponin/acute on chronic diastolic CHF with systolic dysfunction and hypertensive emergency.  Assessment & Plan    1. Elevated troponin/angina decubitus: -Mildly elevated troponin and flat trending consistent with supply demand ischemia 2/2 hypertensive emergency, volume overload, and CKD -No further chest pain -For Lexiscan Myoview this morning -ASA  2. Acute on chronic diastolic CHF with acute systolic CHF: -Preliminary echo showing EF 25-30% (prior 45-50% in 2016) -Continue Coreg and losartan -Not on spironolactone given acute on CKD -Consider transition from losartan to [facility] as an outpatient -Follow up outpatient echo in several months on optimal medical therapy to evaluate for improvement -Stop IV Lasix given bump in SCr (already received a dose this morning) -Will likely need standing PO Lasix at discharge -CHF education  3. Hypertensive emergency: -Much improved BP as above -Decrease Coreg to 12.5 mg bid -Continue losartan as above -Await cortisol level and renin/aldosterone ratio -Plan for outpatient renal artery ultrasound -Consider 24-hour urine metanephrines/catecholamines (will need to hold losartan prior to collection, if done) -Outpatient sleep study  4. CKD stage III: -Renal function worsened with IV Lasix 40 mg q 12 hours -Hold IV Lasix -Monitor -Needs to establish with a nephrologist as an outpatient  5. HLD: -Increase Lipitor to 80 mg daily -Needs follow up fasting lipid panel in ~ 6-8 weeks -If LDL/TG remain elevated at that time, consider addition of Zetia and Vascepa -May need referral to [facility]  6. Poorly controlled DM: -Per IM  7. Obesity/snoring: -Needs outpatient sleep study -Weight loss advised  8. Noncompliance: -Compliance advised   For questions or updates, please contact [facility] Please consult [email or web address] for contact info under  Cardiology/STEMI.    Signed, Eula Listen, PA-C [facility] Pager: [phone] 02/13/2017, 9:32 AM   Attending Note Patient seen and examined, agree with detailed note above,  Patient presentation and plan discussed on rounds.   EKG lab work, chest x-ray, echocardiogram reviewed independently by myself  No events overnight, improved shortness of breath this morning, blood pressure dramatically improved Stress test performed, images reviewed personally by myself Small regions of fixed defect consistent with attenuation artifact otherwise no significant ischemia  On physical exam sitting upright no complaints Unable to estimate JVP given size of neck, lungs clear to auscultation bilaterally, heart sounds regular with normal S1-S2 no murmurs appreciated abdomen obese soft nontender no significant lower extremity edema  Lab work reviewed showing potassium 3.4, creatinine 2.04, BUN 27, glucose 371, troponin 0 0.30 with cholesterol  435, LDL 352  A/P: Hypertensive urgency Would continue on Coreg 12.5 mg twice daily with losartan 50 mg daily Blood pressure dramatically improved after diuresis Also may benefit from Lasix 20 mg daily with potassium 10 mEq daily  Systolic dysfunction Secondary to hypertensive heart disease Stress test showing no ischemia only small fixed defects attenuation artifact Stressed importance of aggressive blood pressure control Need CHF clinic follow-up, also follow-up in her clinic Lasix and potassium to go home with  Hyperlipidemia We will give Lasix 40 mg daily Number should improve with better diabetes control  Diabetes type 2 poorly controlled Stressed importance of the need for dietary changes, insulin  Greater than 50% was spent in counseling and coordination of care with patient Total encounter time 25 minutes or more   Signed: Dossie Arbour  M.D., Ph.D. [facility]

## 2017-02-14 LAB — BASIC METABOLIC PANEL
Anion gap: 8 (ref 5–15)
BUN: 26 mg/dL — ABNORMAL HIGH (ref 6–20)
CO2: 27 mmol/L (ref 22–32)
Calcium: 8.5 mg/dL — ABNORMAL LOW (ref 8.9–10.3)
Chloride: 99 mmol/L — ABNORMAL LOW (ref 101–111)
Creatinine, Ser: 1.92 mg/dL — ABNORMAL HIGH (ref 0.61–1.24)
GFR calc Af Amer: 51 mL/min — ABNORMAL LOW (ref >60)
GFR calc non Af Amer: 44 mL/min — ABNORMAL LOW (ref >60)
Glucose, Bld: 381 mg/dL — ABNORMAL HIGH (ref 65–99)
Potassium: 3.4 mmol/L — ABNORMAL LOW (ref 3.5–5.1)
Sodium: 134 mmol/L — ABNORMAL LOW (ref 135–145)

## 2017-02-14 LAB — GLUCOSE, CAPILLARY: Glucose-Capillary: 304 mg/dL — ABNORMAL HIGH (ref 65–99)

## 2017-02-14 MED ORDER — INSULIN GLARGINE 100 UNIT/ML ~~LOC~~ SOLN
25.0000 [IU] | Freq: Every day | SUBCUTANEOUS | Status: DC
  Administered 2017-02-14: 25 [IU] via SUBCUTANEOUS
  Filled 2017-02-14: qty 0.25

## 2017-02-14 MED ORDER — LIVING WELL WITH DIABETES BOOK
Freq: Once | Status: AC
  Administered 2017-02-14: 11:00:00
  Filled 2017-02-14: qty 1

## 2017-02-14 MED ORDER — INSULIN DETEMIR 100 UNIT/ML ~~LOC~~ SOLN: 25.0000 [IU] | Freq: Every day | SUBCUTANEOUS | 11 refills | Status: DC

## 2017-02-14 MED ORDER — CARVEDILOL 25 MG PO TABS: 25.0000 mg | ORAL_TABLET | Freq: Two times a day (BID) | ORAL | 0 refills | Status: DC

## 2017-02-14 MED ORDER — FUROSEMIDE 20 MG PO TABS: 20.0000 mg | ORAL_TABLET | Freq: Every day | ORAL | 0 refills | Status: DC

## 2017-02-14 MED ORDER — LOSARTAN POTASSIUM 50 MG PO TABS: 50.0000 mg | ORAL_TABLET | Freq: Every day | ORAL | 0 refills | Status: DC

## 2017-02-14 MED ORDER — INSULIN STARTER KIT- PEN NEEDLES (ENGLISH)
1.0000 | Freq: Once | Status: DC
  Filled 2017-02-14: qty 1

## 2017-02-14 MED ORDER — INSULIN ASPART 100 UNIT/ML ~~LOC~~ SOLN: 5.0000 [IU] | Freq: Three times a day (TID) | SUBCUTANEOUS | Status: DC

## 2017-02-14 MED ORDER — INSULIN STARTER KIT- PEN NEEDLES (ENGLISH): 1.0000 | Freq: Once | 0 refills | Status: AC

## 2017-02-14 MED ORDER — INSULIN ASPART 100 UNIT/ML ~~LOC~~ SOLN: 5.0000 [IU] | Freq: Three times a day (TID) | SUBCUTANEOUS | 11 refills | Status: DC

## 2017-02-14 MED ORDER — ATORVASTATIN CALCIUM 80 MG PO TABS: 80.0000 mg | ORAL_TABLET | Freq: Every day | ORAL | 0 refills | Status: DC

## 2017-02-14 NOTE — Care Management (Signed)
Faxed patient's discharge [facility] to [facility].  Confirmed had Levemir on hand.  Patient and his wife verbalize they have a glucose monitor.  Reinstructed on follow through with appointments and applications

## 2017-02-14 NOTE — Progress Notes (Signed)
Inpatient Diabetes Program Recommendations  AACE/ADA: New Consensus Statement on Inpatient Glycemic Control (2015)  Target Ranges:  Prepandial:   less than 140 mg/dL      Peak postprandial:   less than 180 mg/dL (1-2 hours)      Critically ill patients:  140 - 180 mg/dL   Lab Results  Component Value Date   GLUCAP 260 (H) 02/13/2017   HGBA1C 14.7 (H) 02/12/2017    Review of Glycemic Control  Results for Joel [name], Joel [name] (MRN [id number]) as of 02/14/2017 08:37  Ref. Range 02/13/2017 07:54 02/13/2017 11:32 02/13/2017 17:10 02/13/2017 20:40 02/14/2017 08:24  Glucose-Capillary Latest Ref Range: 65 - 99 mg/dL 303 (H) 308 (H) 280 (H) 260 (H) 304 (H)    Home DM Meds:No meds for 1 year due to financial reasons  Current Insulin Orders:Novolog sensitive Correction Scale/ SSI (0-9 units) TID, Novolog 0-5 units qhs, Lantus 18 units qhs   Consider increasing Lantus to 25 units qhs (0.2 units/kg)- fasting blood sugar 375m/dl, increase Novolog correction to moderate correction scale and add Novolog 5 units tid (hold if he eats less than 50%).   I have ordered the Living Well with Diabetes book and an insulin starter kit for the patient- please instruct patient on insulin administration, diabetes basics, A1C, blood sugar testing.   **Care management will need to assist patient in getting discharge insulin- MD please check with case management to determine what rapid acting and what long acting insulins are available at [facility] so the exact order can be written at discharge.   JGentry Fitz RN, BA, MHA, CDE Diabetes Coordinator Inpatient Diabetes Program  [phone](Team Pager) [phone](AArtois 02/14/2017 8:40 AM

## 2017-02-14 NOTE — Discharge Instructions (Signed)
[facility] - [place] at [facility]  DIET:  Cardiac diet,carbohydrate diet  DISCHARGE CONDITION:  Stable  ACTIVITY:  Activity as tolerated  OXYGEN:  Home Oxygen: No.   Oxygen Delivery: room air  DISCHARGE LOCATION:  home    ADDITIONAL DISCHARGE INSTRUCTION: Make sure to take ur medications and insulin  If you experience worsening of your admission symptoms, develop shortness of breath, life threatening emergency, suicidal or homicidal thoughts you must seek medical attention immediately by calling 911 or calling your MD immediately  if symptoms less severe.  You Must read complete instructions/literature along with all the possible adverse reactions/side effects for all the Medicines you take and that have been prescribed to you. Take any new Medicines after you have completely understood and accpet all the possible adverse reactions/side effects.   Please note  You were cared for by a hospitalist during your hospital stay. If you have any questions about your discharge medications or the care you received while you were in the hospital after you are discharged, you can call the unit and asked to speak with the hospitalist on call if the hospitalist that took care of you is not available. Once you are discharged, your primary care physician will handle any further medical issues. Please note that NO REFILLS for any discharge medications will be authorized once you are discharged, as it is imperative that you return to your primary care physician (or establish a relationship with a primary care physician if you do not have one) for your aftercare needs so that they can reassess your need for medications and monitor your lab values.

## 2017-02-14 NOTE — Discharge Summary (Signed)
[place] at [facility], [place] y.o., DOB 11/17/1980, MRN [id number]. Admission date: 02/12/2017 Discharge Date 02/14/2017 Primary MD Patient, No Pcp Per Admitting Physician Epifanio Lesches, MD  Admission Diagnosis  Elevated glucose [R73.09] Elevated troponin [Z12.4] Systolic congestive heart failure, unspecified HF chronicity ([place]) [I50.20]  Discharge Diagnosis   Principal Problem:   Hypertensive emergency Acute on chronic diastolic CHF Chest pain Diabetes type 2 with hyperglycemia Chronic kidney disease stage III Medical noncompliance Hyperlipidemia Morbid obesity      Hospital Course  Patient is a 37 year old male with known history of diabetes hypertension who has not been compliant with his medications presented with complaint of chest pain shortness of breath.  Patient was noted to have accelerated hypertension.  And poorly controlled blood sugars.  He also was thought to have a possible CHF.  He was admitted and started on Lasix.  He underwent a stress test as well.  He was seen by cardiology.  Patient's stress test was low risk stress test.  Patient's medications have been adjusted and he has been provided with supply of medications to take.  He is also restarted on insulin.  Patient's is strongly recommended to be compliant.  He will also need to follow-up with nephrology for his kidney disease.           Consults  cardiology  Significant Tests:  See full reports for all details    Dg Chest 2 View  Result Date: 02/12/2017 CLINICAL DATA:  Cough with shortness of breath. Diabetes with history of diastolic CHF. EXAM: CHEST  2 VIEW COMPARISON:  10/11/2014 FINDINGS: The cardio pericardial silhouette is enlarged. Vascular congestion noted with subtle interstitial opacity. No pleural effusion. The visualized bony structures of the thorax are intact. IMPRESSION: 1. Cardiomegaly with vascular congestion and potential interstitial  pulmonary edema. 2. Subtle ill-defined nodularity in the parahilar regions may be edema related although infection could have this appearance. Electronically Signed   By: Misty Stanley M.D.   On: 02/12/2017 09:25   Nm Myocar Multi W/spect W/wall Motion / Ef  Result Date: 02/13/2017 Pharmacological myocardial perfusion imaging study with no significant ischemia Fixed perfusion defects in the mid anterior wall and inferoapical region consistent with attenuation artifact Global hypokinesis,  EF estimated at 17% No significant EKG changes concerning for ischemia at peak stress or in recovery. Moderate risk scan secondary to depressed cardiac function. Signed, Esmond Plants, MD, Ph.D [facility]       Today   Subjective:   Lucile Shutters patient's breathing is much improved he wants to go home  Objective:   Blood pressure (!) 146/77, pulse (!) 102, temperature 98.5 F (36.9 C), temperature source Oral, resp. rate 18, height _0  (1.702 m), weight 274 lb 6.4 oz (124.5 kg), SpO2 98 %.  .  Intake/Output Summary (Last 24 hours) at 02/14/2017 1745 Last data filed at 02/14/2017 0515 Gross per 24 hour  Intake 240 ml  Output 575 ml  Net -335 ml    Exam VITAL SIGNS: Blood pressure (!) 146/77, pulse (!) 102, temperature 98.5 F (36.9 C), temperature source Oral, resp. rate 18, height _1  (1.702 m), weight 274 lb 6.4 oz (124.5 kg), SpO2 98 %.  GENERAL:  37 y.o.-year-old patient lying in the bed with no acute distress.  EYES: Pupils equal, round, reactive to light and accommodation. No scleral icterus. Extraocular muscles intact.  HEENT: Head atraumatic, normocephalic. Oropharynx and nasopharynx clear.  NECK:  Supple, no jugular  venous distention. No thyroid enlargement, no tenderness.  LUNGS: Normal breath sounds bilaterally, no wheezing, rales,rhonchi or crepitation. No use of accessory muscles of respiration.  CARDIOVASCULAR: S1, S2 normal. No murmurs, rubs, or gallops.  ABDOMEN: Soft, nontender,  nondistended. Bowel sounds present. No organomegaly or mass.  EXTREMITIES: No pedal edema, cyanosis, or clubbing.  NEUROLOGIC: Cranial nerves II through XII are intact. Muscle strength 5/5 in all extremities. Sensation intact. Gait not checked.  PSYCHIATRIC: The patient is alert and oriented x 3.  SKIN: No obvious rash, lesion, or ulcer.   Data Review     CBC w Diff:  Lab Results  Component Value Date   WBC 7.2 02/13/2017   HGB 13.4 02/13/2017   HGB 14.1 06/11/2014   HCT 39.3 (L) 02/13/2017   HCT 42.0 06/11/2014   PLT 291 02/13/2017   PLT 326 06/11/2014   CMP:  Lab Results  Component Value Date   NA 134 (L) 02/14/2017   NA 137 06/11/2014   K 3.4 (L) 02/14/2017   K 3.4 (L) 06/11/2014   CL 99 (L) 02/14/2017   CL 104 06/11/2014   CO2 27 02/14/2017   CO2 25 06/11/2014   BUN 26 (H) 02/14/2017   BUN 24 (H) 06/11/2014   CREATININE 1.92 (H) 02/14/2017   CREATININE 1.48 (H) 06/11/2014   PROT 6.9 02/12/2017   PROT 7.6 06/11/2014   ALBUMIN 3.0 (L) 02/12/2017   ALBUMIN 3.8 06/11/2014   BILITOT 0.7 02/12/2017   BILITOT 0.4 06/11/2014   ALKPHOS 96 02/12/2017   ALKPHOS 67 06/11/2014   AST 25 02/12/2017   AST 21 06/11/2014   ALT 16 (L) 02/12/2017   ALT 23 06/11/2014  .  Micro Results No results found for this or any previous visit (from the past 240 hour(s)).   Code Status History    Date Active Date Inactive Code Status Order ID Comments User Context   02/12/2017 11:31 02/14/2017 14:41 Full Code [id number]  Epifanio Lesches, MD ED   10/11/2014 23:46 10/13/2014 15:34 Full Code [id number]  Henreitta Leber, MD Inpatient   06/11/2014 23:45 06/13/2014 15:11 Full Code [id number]  Verlon Au, RN Inpatient          Follow-up Information    [facility]. Go on 03/18/2017.   Why:  Appointment Time: 10:40am PLEASE BRING PHOTO ID, PROOF OF ADDRESS, AND INSURANCE CARD(S) OR PROOF OF INCOME TO APPOINTMENT. THANKS! Contact information: [facility] [place] [address] [phone]        Anthonette Legato, MD. Go on 02/25/2017.   Specialty:  Internal Medicine Why:  renal failure; , Appointment Time: 11:20am Contact information: [place] [address] [phone]        Minna Merritts, MD. Go on 02/21/2017.   Specialty:  Cardiology Why:  chf, hospital f/u, Appointment Time: 8:40am Contact information: [place] [address] [phone]           Discharge Medications   Allergies as of 02/14/2017      Reactions   Nitroglycerin Other (See Comments)   Increases blood pressure and pain.      Medication List    STOP taking these medications   glipiZIDE 10 MG tablet Commonly known as:  GLUCOTROL   isosorbide mononitrate 30 MG 24 hr tablet Commonly known as:  IMDUR   lisinopril-hydrochlorothiazide 20-12.5 MG tablet Commonly known as:  ZESTORETIC   metoprolol tartrate 100 MG  tablet Commonly known as:  LOPRESSOR     TAKE these medications   aspirin 81 MG chewable tablet Chew 1 tablet (81 mg total) by mouth daily.   aspirin-acetaminophen-caffeine 250-250-65 MG tablet Commonly known as:  EXCEDRIN MIGRAINE Take 0.5 tablets by mouth daily as needed for headache.   atorvastatin 80 MG tablet Commonly known as:  LIPITOR Take 1 tablet (80 mg total) by mouth daily at 6 PM.   carvedilol 25 MG tablet Commonly known as:  COREG Take 1 tablet (25 mg total) by mouth 2 (two) times daily with a meal.   esomeprazole 20 MG capsule Commonly known as:  NEXIUM Take 20 mg by mouth daily as needed.   furosemide 20 MG tablet Commonly known as:  LASIX Take 1 tablet (20 mg total) by mouth daily.   insulin aspart 100 UNIT/ML injection Commonly known as:  novoLOG Inject 5 Units into the skin 3 (three) times daily with meals.   insulin detemir 100 UNIT/ML injection Commonly known as:  LEVEMIR Inject 0.25 mLs (25 Units total) into  the skin at bedtime.   insulin starter kit- pen needles Misc 1 kit by Other route once for 1 dose.   losartan 50 MG tablet Commonly known as:  COZAAR Take 1 tablet (50 mg total) by mouth daily.   potassium chloride 10 MEQ tablet Commonly known as:  K-DUR Take 1 tablet (10 mEq total) by mouth daily.          Total Time in preparing paper work, data evaluation and todays exam - 35 minutes  Dustin Flock M.D on 02/14/2017 at 5:45 PM  [facility]   Office  [phone]

## 2017-02-14 NOTE — Progress Notes (Signed)
Pt to be discharged today. Iv and tele removed. disch instructions and prescrips given to pt and wife to their understanding. disch via w.c. Accompanied by wife.

## 2017-02-20 NOTE — Progress Notes (Signed)
Patient ID: Joel [name], male    DOB: 1980-05-19, 37 y.o.   MRN: [id number]  HPI  Joel [name] is a 37 y/o male with a history of CKD, DM, HTN and chronic heart failure.   Echo report from 02/12/17 reviewed and showed an EF of 25-30% along with mild Joel. EF has declined from 2016.  Admitted 02/12/17 due to HF and HTN. Cardiology consult was obtained and medications were adjusted and he was discharged after 2 days.  He presents today for his initial visit with a chief complaint of moderate fatigue with little exertion. He describes this as being present for several years with varying levels of severity. He says that he wakes up just as tired as when he went to bed and stays tired all day. Does endorse snoring and apneic episodes in the past. Has associated decreased appetite, cough, dizziness and difficulty sleeping along with this. Denies any chest pain, shortness of breath, edema, palpitations or weight gain.   Past Medical History:  Diagnosis Date  . Chronic diastolic CHF (congestive heart failure) (HCC)   . Chronic kidney disease (CKD), stage III (moderate) (HCC)   . Diabetes mellitus with complication (HCC)   . Hypertensive heart disease   . Noncompliance with medications   . Obesity   . Systolic dysfunction    a. TTE 06/2014: EF 45-50%, normal wall motion, mild Joel, mild AI   Past Surgical History:  Procedure Laterality Date  . NO PAST SURGERIES     Family History  Problem Relation Age of Onset  . Hypertension Mother   . Hyperlipidemia Mother   . Diabetes Mother   . CAD Father        a. stenting in his early 88s  . Diabetes Maternal Grandmother   . Diabetes Maternal Grandfather   . CAD Paternal Grandmother   . CAD Paternal Grandfather    Social History   Tobacco Use  . Smoking status: Never Smoker  . Smokeless tobacco: Never Used  Substance Use Topics  . Alcohol use: No    Frequency: Never   Allergies  Allergen Reactions  . Nitroglycerin Other (See Comments)   Increases blood pressure and pain.   Prior to Admission medications   Medication Sig Start Date End Date Taking? Authorizing Provider  aspirin 81 MG chewable tablet Chew 1 tablet (81 mg total) by mouth daily. 10/13/14  Yes Milagros Loll, MD  aspirin-acetaminophen-caffeine (EXCEDRIN MIGRAINE) [phone] MG per tablet Take 0.5 tablets by mouth daily as needed for headache.    Yes [provider]  atorvastatin (LIPITOR) 80 MG tablet Take 1 tablet (80 mg total) by mouth daily at 6 PM. 02/14/17  Yes [name], MD  carvedilol (COREG) 25 MG tablet Take 1 tablet (25 mg total) by mouth 2 (two) times daily with a meal. 02/14/17  Yes [name], MD  esomeprazole (NEXIUM) 20 MG capsule Take 20 mg by mouth daily as needed.   Yes [provider]  furosemide (LASIX) 20 MG tablet Take 1 tablet (20 mg total) by mouth daily. 02/14/17  Yes [name], MD  insulin aspart (NOVOLOG) 100 UNIT/ML injection Inject 5 Units into the skin 3 (three) times daily with meals. 02/14/17  Yes [name], MD  insulin detemir (LEVEMIR) 100 UNIT/ML injection Inject 0.25 mLs (25 Units total) into the skin at bedtime. 02/14/17  Yes [name], MD  losartan (COZAAR) 50 MG tablet Take 1 tablet (50 mg total) by mouth daily. 02/14/17  Yes [name],  MD  potassium chloride (K-DUR) 10 MEQ tablet Take 1 tablet (10 mEq total) by mouth daily. Patient not taking: Reported on 02/21/2017 10/13/14   Milagros Loll, MD    Review of Systems  Constitutional: Positive for appetite change (decreased) and fatigue.  HENT: Negative for congestion, postnasal drip and sore throat.   Eyes: Negative.   Respiratory: Positive for cough. Negative for chest tightness and shortness of breath.   Cardiovascular: Negative for chest pain, palpitations and leg swelling.  Gastrointestinal: Negative for abdominal distention and abdominal pain.  Endocrine: Negative.   Genitourinary: Negative.   Musculoskeletal: Positive for  arthralgias (right leg is stiff). Negative for back pain.  Skin: Negative.   Allergic/Immunologic: Negative.   Neurological: Positive for dizziness and light-headedness.       Feet cramps  Hematological: Negative for adenopathy. Does not bruise/bleed easily.  Psychiatric/Behavioral: Positive for sleep disturbance (interrupted sleep).    Vitals:   02/21/17 0902  BP: 131/90  Pulse: 98  Resp: 18  SpO2: 98%  Weight: 287 lb 4 oz (130.3 kg)  Height: 5\' 7"  (1.702 m)   Wt Readings from Last 3 Encounters:  02/21/17 287 lb 4 oz (130.3 kg)  02/14/17 274 lb 6.4 oz (124.5 kg)  10/13/14 299 lb 9.6 oz (135.9 kg)   Lab Results  Component Value Date   CREATININE 1.92 (H) 02/14/2017   CREATININE 2.04 (H) 02/13/2017   CREATININE 1.76 (H) 02/12/2017   Physical Exam  Constitutional: He is oriented to person, place, and time. He appears well-developed and well-nourished.  HENT:  Head: Normocephalic and atraumatic.  Neck: Normal range of motion. Neck supple. No JVD present.  Cardiovascular: Regular rhythm. Tachycardia present.  Pulmonary/Chest: Effort normal. He has no wheezes. He has no rales.  Abdominal: Soft. He exhibits no distension. There is no tenderness.  Musculoskeletal: He exhibits no edema or tenderness.  Neurological: He is alert and oriented to person, place, and time.  Skin: Skin is warm and dry.  Psychiatric: He has a normal mood and affect. His behavior is normal. Thought content normal.  Nursing note and vitals reviewed.   Assessment & Plan:  1: Chronic heart failure with reduced ejection fraction- - NYHA class III - euvolemic today - weighing daily and says that his weight has declined recently. Discussed the importance of calling for an overnight weight gain of >2 pounds or a weekly weight gain of >5 pounds - using Mrs. Dash for seasoning & has been reading food labels for sodium content. Discussed the importance of keeping daily sodium intake to 2000mg  daily -  discussed changing his losartan to entresto and will have him finish out his losartan first - currently not taking potassium supplements and is having some cramping in his feet - will drawn a BMP today - BNP on 02/12/17 was 309.0 - saw cardiologist (Paraschos) in 2016 & says that Eula Listen saw him in the hospital - patient reports receiving his flu and pneumonia vaccines for this season - will [facility] in new prescriptions for [facility] - PharmD reviewed medications with the patient  2: HTN- - BP mildly elevated today - BMP from 02/14/17 was reviewed and showed sodium 134, potassium 3.4 and GFR 51 - saw PCP Ether Griffins) 04/18/16  3: Diabetes- - glucose at home was 145 - A1c on 02/12/17 was 14.7 - has seen nephrologist in the past  4: Fatigue- - patient and significant other endorse snoring - has difficulty staying asleep during the night and wakes  up just as tired as when he went to bed - agreeable to a sleep study to rule out sleep apnea  Medication bottles were reviewed.  Return in 2 weeks or sooner for any questions/problems before then. Plan to switch to entresto at that time with close monitoring of his renal function.   Medication bottles were reviewed.

## 2017-02-21 ENCOUNTER — Encounter: Payer: Self-pay | Admitting: Family

## 2017-02-21 ENCOUNTER — Telehealth: Payer: Self-pay | Admitting: *Deleted

## 2017-02-21 ENCOUNTER — Telehealth: Payer: Self-pay | Admitting: Family

## 2017-02-21 ENCOUNTER — Ambulatory Visit: Payer: Medicaid Other | Attending: Family | Admitting: Family

## 2017-02-21 VITALS — BP 131/90 | HR 98 | Resp 18 | Ht 67.0 in | Wt 287.2 lb

## 2017-02-21 DIAGNOSIS — Z833 Family history of diabetes mellitus: Secondary | ICD-10-CM | POA: Insufficient documentation

## 2017-02-21 DIAGNOSIS — I509 Heart failure, unspecified: Secondary | ICD-10-CM | POA: Diagnosis present

## 2017-02-21 DIAGNOSIS — Z7982 Long term (current) use of aspirin: Secondary | ICD-10-CM | POA: Diagnosis not present

## 2017-02-21 DIAGNOSIS — Z888 Allergy status to other drugs, medicaments and biological substances status: Secondary | ICD-10-CM | POA: Diagnosis not present

## 2017-02-21 DIAGNOSIS — N183 Chronic kidney disease, stage 3 unspecified: Secondary | ICD-10-CM

## 2017-02-21 DIAGNOSIS — Z794 Long term (current) use of insulin: Secondary | ICD-10-CM | POA: Insufficient documentation

## 2017-02-21 DIAGNOSIS — E1165 Type 2 diabetes mellitus with hyperglycemia: Secondary | ICD-10-CM

## 2017-02-21 DIAGNOSIS — R0683 Snoring: Secondary | ICD-10-CM | POA: Diagnosis not present

## 2017-02-21 DIAGNOSIS — Z8249 Family history of ischemic heart disease and other diseases of the circulatory system: Secondary | ICD-10-CM | POA: Insufficient documentation

## 2017-02-21 DIAGNOSIS — I5022 Chronic systolic (congestive) heart failure: Secondary | ICD-10-CM

## 2017-02-21 DIAGNOSIS — R5383 Other fatigue: Secondary | ICD-10-CM | POA: Insufficient documentation

## 2017-02-21 DIAGNOSIS — I10 Essential (primary) hypertension: Secondary | ICD-10-CM

## 2017-02-21 DIAGNOSIS — Z79899 Other long term (current) drug therapy: Secondary | ICD-10-CM | POA: Insufficient documentation

## 2017-02-21 DIAGNOSIS — Z6841 Body Mass Index (BMI) 40.0 and over, adult: Secondary | ICD-10-CM | POA: Diagnosis not present

## 2017-02-21 DIAGNOSIS — I130 Hypertensive heart and chronic kidney disease with heart failure and stage 1 through stage 4 chronic kidney disease, or unspecified chronic kidney disease: Secondary | ICD-10-CM | POA: Diagnosis not present

## 2017-02-21 DIAGNOSIS — E1122 Type 2 diabetes mellitus with diabetic chronic kidney disease: Secondary | ICD-10-CM | POA: Insufficient documentation

## 2017-02-21 DIAGNOSIS — E1121 Type 2 diabetes mellitus with diabetic nephropathy: Secondary | ICD-10-CM | POA: Insufficient documentation

## 2017-02-21 DIAGNOSIS — E669 Obesity, unspecified: Secondary | ICD-10-CM | POA: Insufficient documentation

## 2017-02-21 DIAGNOSIS — R5382 Chronic fatigue, unspecified: Secondary | ICD-10-CM

## 2017-02-21 DIAGNOSIS — E1022 Type 1 diabetes mellitus with diabetic chronic kidney disease: Secondary | ICD-10-CM

## 2017-02-21 LAB — BASIC METABOLIC PANEL
Anion gap: 9 (ref 5–15)
BUN: 25 mg/dL — ABNORMAL HIGH (ref 6–20)
CO2: 26 mmol/L (ref 22–32)
Calcium: 8.8 mg/dL — ABNORMAL LOW (ref 8.9–10.3)
Chloride: 102 mmol/L (ref 101–111)
Creatinine, Ser: 1.97 mg/dL — ABNORMAL HIGH (ref 0.61–1.24)
GFR calc Af Amer: 49 mL/min — ABNORMAL LOW (ref >60)
GFR calc non Af Amer: 42 mL/min — ABNORMAL LOW (ref >60)
Glucose, Bld: 196 mg/dL — ABNORMAL HIGH (ref 65–99)
Potassium: 3.6 mmol/L (ref 3.5–5.1)
Sodium: 137 mmol/L (ref 135–145)

## 2017-02-21 MED ORDER — FUROSEMIDE 20 MG PO TABS: 20.0000 mg | ORAL_TABLET | Freq: Every day | ORAL | 3 refills | Status: DC

## 2017-02-21 MED ORDER — ATORVASTATIN CALCIUM 80 MG PO TABS: 80.0000 mg | ORAL_TABLET | Freq: Every day | ORAL | 3 refills | Status: DC

## 2017-02-21 MED ORDER — CARVEDILOL 25 MG PO TABS: 25.0000 mg | ORAL_TABLET | Freq: Two times a day (BID) | ORAL | 3 refills | Status: DC

## 2017-02-21 MED ORDER — ESOMEPRAZOLE MAGNESIUM 20 MG PO CPDR: 20.0000 mg | DELAYED_RELEASE_CAPSULE | Freq: Every day | ORAL | 3 refills | Status: DC | PRN

## 2017-02-21 MED ORDER — SACUBITRIL-VALSARTAN 24-26 MG PO TABS: 1.0000 | ORAL_TABLET | Freq: Two times a day (BID) | ORAL | 3 refills | Status: DC

## 2017-02-21 NOTE — Telephone Encounter (Signed)
-----   Message from Sabrina F Gilley sent at 02/21/2017  3:58 PM EST ----- Regarding: tmc/ph 1/16 3:20 Dr. Gollan 

## 2017-02-21 NOTE — Telephone Encounter (Signed)
I spoke with the patient's wife as she schedules all of his appointments:  Patient (wife) contacted regarding discharge from: [facility] on 02/14/17.  Patient (wife) understands to follow up with Dr. Mariah Milling on Wednesday 02/26/17 at 3:20 pm at the [facility] office.  Patient (wife)  understands discharge instructions? Yes  Patient (wife) understands medications and regiment? Yes  Patient (wife) understands to bring all medications to this visit? yes  {

## 2017-02-21 NOTE — Telephone Encounter (Signed)
Patient contacted regarding discharge from [facility] on 02/14/17.  Patient understands to follow up with provider Dr. Mariah Milling on 02/26/17 at 3:20 PM at [facility]. Patient understands discharge instructions? Yes Patient understands medications and regiment? Yes Patient understands to bring all medications to this visit? Yes  She verbalized understanding with no further questions at this time.

## 2017-02-21 NOTE — Telephone Encounter (Signed)
Spoke with patient's wife regarding lab results that were obtained today (02/21/17). Advised that his potassium and sodium levels were normal so doubtful that the cramping is related to potassium level. Renal function is stable at the moment so will continue medications at this time.

## 2017-02-21 NOTE — Patient Instructions (Signed)
Continue weighing daily and call for an overnight weight gain of > 2 pounds or a weekly weight gain of >5 pounds. 

## 2017-02-21 NOTE — Telephone Encounter (Signed)
-----   Message from Coralee Rud sent at 02/21/2017  3:58 PM EST ----- Regarding: tmc/ph 1/16 3:20 Dr. Mariah Milling

## 2017-02-23 DIAGNOSIS — I110 Hypertensive heart disease with heart failure: Secondary | ICD-10-CM | POA: Insufficient documentation

## 2017-02-23 DIAGNOSIS — I420 Dilated cardiomyopathy: Secondary | ICD-10-CM | POA: Insufficient documentation

## 2017-02-23 NOTE — Progress Notes (Signed)
Cardiology Office Note  Date:  02/26/2017   ID:  Joel [name], DOB 11-12-1980, MRN [id number]  PCP:  Patient, No Pcp Per   Chief Complaint  Patient presents with  . OTHER    CHF c/o coughing. Meds reviewed verbally with pt.    HPI:  37 y.o. 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 He presents to establish care in the office,  for follow-up of his systolic and diastolic CHF  Presented to the hospital 02/12/17/18 due to HF and HTN.  Hypertensive urgency on arrival Echo report from 02/12/17 EF of 25-30%  Aggressive diuresis, blood pressure control Started on Coreg 12.5 mg twice daily with losartan 50 mg daily  Lasix 20 mg daily  Since discharge has seen Dr. Cherylann Ratel, CHF clinic  Reports he is decrease his fluid intake, changed his diet radically On insulin, eating better avg 117 glucose  Still with scant cough nonproductive Sleeping better, less shortness of breath on exertion Getting medications through medical management  EKG personally reviewed by myself on todays visit Shows sinus rhythm rate 98 bpm no significant ST or T-wave changes   PMH:   has a past medical history of Chronic diastolic CHF (congestive heart failure) (HCC), Chronic kidney disease (CKD), stage III (moderate) (HCC), Diabetes mellitus with complication (HCC), Hyperlipidemia, Hypertension, Hypertensive heart disease, Noncompliance with medications, Obesity, Stroke (HCC), and Systolic dysfunction.  PSH:    Past Surgical History:  Procedure Laterality Date  . NO PAST SURGERIES      Current Outpatient Medications  Medication Sig  Dispense Refill  . aspirin 81 MG chewable tablet Chew 1 tablet (81 mg total) by mouth daily.    Marland Kitchen aspirin-acetaminophen-caffeine (EXCEDRIN MIGRAINE) 250-250-65 MG per tablet Take 0.5 tablets by mouth daily as needed for headache.     Marland Kitchen atorvastatin (LIPITOR) 80 MG tablet Take 1 tablet (80 mg total) by mouth daily at 6 PM. 90 tablet 3  . carvedilol (COREG) 25 MG tablet Take 1 tablet (25 mg total) by mouth 2 (two) times daily with a meal. 180 tablet 3  . esomeprazole (NEXIUM) 20 MG capsule Take 1 capsule (20 mg total) by mouth daily as needed. 90 capsule 3  . furosemide (LASIX) 20 MG tablet Take 1 tablet (20 mg total) by mouth daily. 90 tablet 3  . insulin aspart (NOVOLOG) 100 UNIT/ML injection Inject 5 Units into the skin 3 (three) times daily with meals. 10 mL 11  . insulin detemir (LEVEMIR) 100 UNIT/ML injection Inject 0.25 mLs (25 Units total) into the skin at bedtime. 10 mL 11  . sacubitril-valsartan (ENTRESTO) 49-51 MG Take 1 tablet by mouth 2 (two) times daily. 60 tablet 6   No current facility-administered medications for this visit.      Allergies:   Nitroglycerin   Social History:  The patient  reports that  has never smoked. he has never used smokeless tobacco. He reports that he does not drink alcohol or use drugs.   Family History:   family history includes CAD in his father, paternal grandfather, and paternal grandmother; Diabetes in his maternal grandfather, maternal grandmother, and mother; Heart disease in his father; Hyperlipidemia in his mother; Hypertension in his mother;  Stroke in his father.    Review of Systems: Review of Systems  Constitutional: Negative.   Respiratory: Positive for cough.   Cardiovascular: Negative.   Gastrointestinal: Negative.   Musculoskeletal: Negative.   Neurological: Negative.   Psychiatric/Behavioral: Negative.   All other systems reviewed and are negative.    PHYSICAL EXAM: VS:  BP (!) 144/104 (BP Location: Right Arm, Patient  Position: Sitting, Cuff Size: Large)   Pulse 98   Ht 5\' 7"  (1.702 m)   Wt 286 lb (129.7 kg)   BMI 44.79 kg/m  , BMI Body mass index is 44.79 kg/m. GEN: Well nourished, well developed, in no acute distress , obese HEENT: normal  Neck: no JVD, carotid bruits, or masses Cardiac: RRR; no murmurs, rubs, or gallops,no edema  Respiratory:  clear to auscultation bilaterally, normal work of breathing GI: soft, nontender, nondistended, + BS MS: no deformity or atrophy  Skin: warm and dry, no rash Neuro:  Strength and sensation are intact Psych: euthymic mood, full affect   Recent Labs: 02/12/2017: B Natriuretic Peptide 309.0; TSH 2.704 02/13/2017: Hemoglobin 13.4; Platelets 291 02/25/2017: ALT 14; BUN 22; Creatinine, Ser 1.87; Potassium 3.7; Sodium 137    Lipid Panel Lab Results  Component Value Date   CHOL 435 (H) 02/13/2017   HDL 30 (L) 02/13/2017   LDLCALC 352 (H) 02/13/2017   TRIG 267 (H) 02/13/2017      Wt Readings from Last 3 Encounters:  02/26/17 286 lb (129.7 kg)  02/21/17 287 lb 4 oz (130.3 kg)  02/14/17 274 lb 6.4 oz (124.5 kg)       ASSESSMENT AND PLAN:  Chronic systolic congestive heart failure (HCC) - Plan: EKG 12-Lead Nonischemic dilated cardiomyopathy Poorly controlled hypertension and diabetes  medication changes as below We'll continue Lasix, Coreg, entresto  Consider basic metabolic panel end of January 2019  Essential hypertension  recommended he double his losartan up to 50 twice a day Once his prescription runs out, suggested he start entresto 49/51 milligrams twice a day Samples provided, new prescription sent in to medical management   Type 1 diabetes mellitus with stage 3 chronic kidney disease (HCC)  reports dramatically improved glucose levels after dietary change, staying on his insulin   History of medication noncompliance Prescription sent in to medical management   Chronic  kidney disease (CKD), stage III (moderate) (HCC) creatinine 1.8,  seen by Dr. Cherylann Ratel  Hypertensive heart disease with heart failure (HCC) Blood pressure continues to run high, medication changes as above recommended he monitor blood pressure at home  Disposition:   F/U  6 months   Total encounter time more than 25 minutes  Greater than 50% was spent in counseling and coordination of care with the patient    Orders Placed This Encounter  Procedures  . EKG 12-Lead     Signed, Dossie Arbour, M.D., Ph.D. 02/26/2017  [facility] [place], [place] [phone]

## 2017-02-25 ENCOUNTER — Other Ambulatory Visit
Admission: RE | Admit: 2017-02-25 | Discharge: 2017-02-25 | Disposition: A | Discharge status: Home / Self Care | Payer: Medicaid Other | Source: Ambulatory Visit | Attending: Nephrology | Admitting: Nephrology

## 2017-02-25 DIAGNOSIS — N183 Chronic kidney disease, stage 3 (moderate): Secondary | ICD-10-CM | POA: Insufficient documentation

## 2017-02-25 LAB — COMPREHENSIVE METABOLIC PANEL
ALT: 14 U/L — ABNORMAL LOW (ref 17–63)
AST: 13 U/L — ABNORMAL LOW (ref 15–41)
Albumin: 2.8 g/dL — ABNORMAL LOW (ref 3.5–5.0)
Alkaline Phosphatase: 71 U/L (ref 38–126)
Anion gap: 9 (ref 5–15)
BUN: 22 mg/dL — ABNORMAL HIGH (ref 6–20)
CO2: 26 mmol/L (ref 22–32)
Calcium: 8.5 mg/dL — ABNORMAL LOW (ref 8.9–10.3)
Chloride: 102 mmol/L (ref 101–111)
Creatinine, Ser: 1.87 mg/dL — ABNORMAL HIGH (ref 0.61–1.24)
GFR calc Af Amer: 52 mL/min — ABNORMAL LOW (ref >60)
GFR calc non Af Amer: 45 mL/min — ABNORMAL LOW (ref >60)
Glucose, Bld: 189 mg/dL — ABNORMAL HIGH (ref 65–99)
Potassium: 3.7 mmol/L (ref 3.5–5.1)
Sodium: 137 mmol/L (ref 135–145)
Total Bilirubin: 0.7 mg/dL (ref 0.3–1.2)
Total Protein: 8.1 g/dL (ref 6.5–8.1)

## 2017-02-26 ENCOUNTER — Encounter: Payer: Self-pay | Admitting: Cardiovascular Disease

## 2017-02-26 ENCOUNTER — Ambulatory Visit (INDEPENDENT_AMBULATORY_CARE_PROVIDER_SITE_OTHER): Payer: Self-pay | Admitting: Cardiovascular Disease

## 2017-02-26 VITALS — BP 144/104 | HR 98 | Ht 67.0 in | Wt 286.0 lb

## 2017-02-26 DIAGNOSIS — Z9114 Patient's other noncompliance with medication regimen: Secondary | ICD-10-CM

## 2017-02-26 DIAGNOSIS — I110 Hypertensive heart disease with heart failure: Secondary | ICD-10-CM

## 2017-02-26 DIAGNOSIS — I5022 Chronic systolic (congestive) heart failure: Secondary | ICD-10-CM

## 2017-02-26 DIAGNOSIS — E1022 Type 1 diabetes mellitus with diabetic chronic kidney disease: Secondary | ICD-10-CM

## 2017-02-26 DIAGNOSIS — N183 Chronic kidney disease, stage 3 unspecified: Secondary | ICD-10-CM

## 2017-02-26 DIAGNOSIS — I10 Essential (primary) hypertension: Secondary | ICD-10-CM

## 2017-02-26 DIAGNOSIS — I428 Other cardiomyopathies: Secondary | ICD-10-CM

## 2017-02-26 LAB — PROTEIN ELECTROPHORESIS, SERUM
A/G Ratio: 0.7 (ref 0.7–1.7)
Albumin ELP: 2.7 g/dL — ABNORMAL LOW (ref 2.9–4.4)
Alpha-1-Globulin: 0.4 g/dL (ref 0.0–0.4)
Alpha-2-Globulin: 1.1 g/dL — ABNORMAL HIGH (ref 0.4–1.0)
Beta Globulin: 1.1 g/dL (ref 0.7–1.3)
Gamma Globulin: 1.3 g/dL (ref 0.4–1.8)
Globulin, Total: 3.9 g/dL (ref 2.2–3.9)
Total Protein ELP: 6.6 g/dL (ref 6.0–8.5)

## 2017-02-26 LAB — PROTEIN ELECTRO, RANDOM URINE
Albumin ELP, Urine: 58.1 %
Alpha-1-Globulin, U: 4.3 %
Alpha-2-Globulin, U: 12.8 %
Beta Globulin, U: 11.8 %
Gamma Globulin, U: 13.1 %
Total Protein, Urine: 272.5 mg/dL

## 2017-02-26 MED ORDER — SACUBITRIL-VALSARTAN 49-51 MG PO TABS: 1.0000 | ORAL_TABLET | Freq: Two times a day (BID) | ORAL | 6 refills | Status: DC

## 2017-02-26 NOTE — Patient Instructions (Addendum)
Medication Instructions:   Please take losartan two a day (am and pm) Please stop losartan when you run out  Then start the entresto one pill twice a day 49/51 mg twice a day  Labwork:  No new labs needed  Testing/Procedures:  No further testing at this time   Follow-Up: It was a pleasure seeing you in the office today. Please call us if you have new issues that need to be addressed before your next appt.  [phone]  Your physician wants you to follow-up in: 6 months.  You will receive a reminder letter in the mail two months in advance. If you don't receive a letter, please call our office to schedule the follow-up appointment.  If you need a refill on your cardiac medications before your next appointment, please call your pharmacy.   Medication Samples have been provided to the patient.  Drug name: [name]       Strength: 49/51 mg        Qty: 1 box  LOT: [id number]  Exp.Date: May 2020  Dosing instructions: Take 1 tablet twice daily Coupon card for free and $10 copay provided as well.

## 2017-02-27 ENCOUNTER — Other Ambulatory Visit: Payer: Self-pay | Admitting: Nephrology

## 2017-02-27 DIAGNOSIS — N183 Chronic kidney disease, stage 3 unspecified: Secondary | ICD-10-CM

## 2017-02-27 LAB — ALDOSTERONE + RENIN ACTIVITY W/ RATIO
ALDO / PRA Ratio: 5 (ref 0.0–30.0)
Aldosterone: 4.3 ng/dL (ref 0.0–30.0)
PRA LC/MS/MS: 0.863 ng/mL/hr (ref 0.167–5.380)

## 2017-02-27 LAB — ANA W/REFLEX IF POSITIVE: Anti Nuclear Antibody(ANA): NEGATIVE

## 2017-03-03 ENCOUNTER — Ambulatory Visit
Admission: RE | Admit: 2017-03-03 | Discharge: 2017-03-03 | Disposition: A | Discharge status: Home / Self Care | Payer: Medicaid Other | Source: Ambulatory Visit | Attending: Nephrology | Admitting: Nephrology

## 2017-03-03 ENCOUNTER — Other Ambulatory Visit: Payer: Self-pay

## 2017-03-03 ENCOUNTER — Encounter: Payer: Self-pay | Admitting: *Deleted

## 2017-03-03 ENCOUNTER — Emergency Department: Payer: Medicaid Other

## 2017-03-03 ENCOUNTER — Telehealth: Payer: Self-pay | Admitting: Cardiovascular Disease

## 2017-03-03 ENCOUNTER — Emergency Department
Admission: EM | Admit: 2017-03-03 | Discharge: 2017-03-03 | Disposition: A | Discharge status: Home / Self Care | Payer: Medicaid Other | Attending: Student in an Organized Health Care Education/Training Program | Admitting: Student in an Organized Health Care Education/Training Program

## 2017-03-03 ENCOUNTER — Ambulatory Visit: Payer: Self-pay

## 2017-03-03 DIAGNOSIS — I2510 Atherosclerotic heart disease of native coronary artery without angina pectoris: Secondary | ICD-10-CM | POA: Diagnosis not present

## 2017-03-03 DIAGNOSIS — N183 Chronic kidney disease, stage 3 unspecified: Secondary | ICD-10-CM

## 2017-03-03 DIAGNOSIS — R51 Headache: Secondary | ICD-10-CM | POA: Diagnosis present

## 2017-03-03 DIAGNOSIS — I5032 Chronic diastolic (congestive) heart failure: Secondary | ICD-10-CM | POA: Insufficient documentation

## 2017-03-03 DIAGNOSIS — H539 Unspecified visual disturbance: Secondary | ICD-10-CM | POA: Diagnosis not present

## 2017-03-03 DIAGNOSIS — Z79899 Other long term (current) drug therapy: Secondary | ICD-10-CM | POA: Diagnosis not present

## 2017-03-03 DIAGNOSIS — Z8673 Personal history of transient ischemic attack (TIA), and cerebral infarction without residual deficits: Secondary | ICD-10-CM | POA: Diagnosis not present

## 2017-03-03 DIAGNOSIS — I129 Hypertensive chronic kidney disease with stage 1 through stage 4 chronic kidney disease, or unspecified chronic kidney disease: Secondary | ICD-10-CM | POA: Insufficient documentation

## 2017-03-03 DIAGNOSIS — Z7982 Long term (current) use of aspirin: Secondary | ICD-10-CM | POA: Insufficient documentation

## 2017-03-03 DIAGNOSIS — E1122 Type 2 diabetes mellitus with diabetic chronic kidney disease: Secondary | ICD-10-CM | POA: Insufficient documentation

## 2017-03-03 DIAGNOSIS — R519 Headache, unspecified: Secondary | ICD-10-CM

## 2017-03-03 DIAGNOSIS — I110 Hypertensive heart disease with heart failure: Secondary | ICD-10-CM | POA: Diagnosis not present

## 2017-03-03 LAB — COMPREHENSIVE METABOLIC PANEL
ALT: 11 U/L — ABNORMAL LOW (ref 17–63)
AST: 12 U/L — ABNORMAL LOW (ref 15–41)
Albumin: 3 g/dL — ABNORMAL LOW (ref 3.5–5.0)
Alkaline Phosphatase: 70 U/L (ref 38–126)
Anion gap: 10 (ref 5–15)
BUN: 26 mg/dL — ABNORMAL HIGH (ref 6–20)
CO2: 26 mmol/L (ref 22–32)
Calcium: 8.9 mg/dL (ref 8.9–10.3)
Chloride: 103 mmol/L (ref 101–111)
Creatinine, Ser: 1.85 mg/dL — ABNORMAL HIGH (ref 0.61–1.24)
GFR calc Af Amer: 53 mL/min — ABNORMAL LOW (ref >60)
GFR calc non Af Amer: 46 mL/min — ABNORMAL LOW (ref >60)
Glucose, Bld: 166 mg/dL — ABNORMAL HIGH (ref 65–99)
Potassium: 3.8 mmol/L (ref 3.5–5.1)
Sodium: 139 mmol/L (ref 135–145)
Total Bilirubin: 0.6 mg/dL (ref 0.3–1.2)
Total Protein: 7.9 g/dL (ref 6.5–8.1)

## 2017-03-03 LAB — CBC
HCT: 34.3 % — ABNORMAL LOW (ref 40.0–52.0)
Hemoglobin: 11.5 g/dL — ABNORMAL LOW (ref 13.0–18.0)
MCH: 26.5 pg (ref 26.0–34.0)
MCHC: 33.6 g/dL (ref 32.0–36.0)
MCV: 78.9 fL — ABNORMAL LOW (ref 80.0–100.0)
Platelets: 343 10*3/uL (ref 150–440)
RBC: 4.35 MIL/uL — ABNORMAL LOW (ref 4.40–5.90)
RDW: 14.1 % (ref 11.5–14.5)
WBC: 7.8 10*3/uL (ref 3.8–10.6)

## 2017-03-03 LAB — TROPONIN I: Troponin I: 0.04 ng/mL (ref <0.03)

## 2017-03-03 MED ORDER — PROCHLORPERAZINE EDISYLATE 5 MG/ML IJ SOLN
INTRAMUSCULAR | Status: AC
  Administered 2017-03-03: 10 mg
  Filled 2017-03-03: qty 2

## 2017-03-03 MED ORDER — ACETAMINOPHEN 500 MG PO TABS
1000.0000 mg | ORAL_TABLET | Freq: Once | ORAL | Status: AC
  Administered 2017-03-03: 1000 mg via ORAL
  Filled 2017-03-03: qty 2

## 2017-03-03 MED ORDER — PROCHLORPERAZINE MALEATE 10 MG PO TABS
10.0000 mg | ORAL_TABLET | Freq: Once | ORAL | Status: DC
  Filled 2017-03-03 (×2): qty 1

## 2017-03-03 MED ORDER — TETRACAINE HCL 0.5 % OP SOLN
2.0000 [drp] | Freq: Once | OPHTHALMIC | Status: AC
  Administered 2017-03-03: 2 [drp] via OPHTHALMIC
  Filled 2017-03-03: qty 4

## 2017-03-03 NOTE — ED Triage Notes (Signed)
Pt reports visual change 5 days ago and loss of balance for 5 days.  Hx cva 2016.  States it feels like another stroke again. Pt has sharp pain on left side of head.  Pt alert. Speech clear.

## 2017-03-03 NOTE — Telephone Encounter (Signed)
Pt spouse calling stating pt is having vision issue along with serve vertigo   States this is been going on for about four days now  This happened last time when he was having a stroke. He had stroke about 2.5 years ago  It was just his vision.  She is concerned this is happening again  He can't even stand up correctly   Would like some advise on this

## 2017-03-03 NOTE — Discharge Instructions (Signed)

## 2017-03-03 NOTE — Telephone Encounter (Addendum)
Pt w/history of stroke, hypertensive heart disease and medication non-compliance called to report vision problems and possible TIA sx for 4 days.  Symptoms included lightheadedness, loss of balance and inability to walk. He has to lean on wife for support. During episode he experiences partial left eye blindness, happening at least twice daily. Last episode 15 minutes ago. Reports that he had similar sx prior to previous CVA. He is unsure of BP as he does not check it at home.  Reviewed stroke signs. Pt denies numbness, confusion, headache of difficulty speaking but confirms vision and balance issues. He is leaving the hospital at this time from a renal ultrasound appt. Advised to proceed to ED for sx and treatment.  Pt and wife agreeable.

## 2017-03-03 NOTE — ED Provider Notes (Signed)
[facility] Emergency Department Provider Note    None    (approximate)  I have reviewed the triage vital signs and the nursing notes.   HISTORY  Chief Complaint Decreased Visual Acuity    HPI Joel [name] is a 37 y.o. male with a history of diabetes hypertension and CKD presents with chief complaint of 4-5 days of intermittent blurry vision in his left eye associated with left eye headache.  Patient states he has had similar episodes in the past but was previously related to the right side.  There is some documentation the patient had a previous stroke but in fact the patient is referring to when he is diagnosed with macular degeneration.  Denies any numbness or tingling.  No nausea or vomiting.  No fevers.  States that headache is mild to moderate.  Was not sudden in onset.  States it is worse after he takes a deep breath and exhales.  No family history of LOC glaucoma.  Past Medical History:  Diagnosis Date  . Chronic diastolic CHF (congestive heart failure) (HCC)   . Chronic kidney disease (CKD), stage III (moderate) (HCC)   . Diabetes mellitus with complication (HCC)   . Hyperlipidemia   . Hypertension   . Hypertensive heart disease   . Noncompliance with medications   . Obesity   . Stroke (HCC)   . Systolic dysfunction    a. TTE 06/2014: EF 45-50%, normal wall motion, mild MR, mild AI   Family History  Problem Relation Age of Onset  . Hypertension Mother   . Hyperlipidemia Mother   . Diabetes Mother   . CAD Father        a. stenting in his early 52s  . Heart disease Father   . Stroke Father   . Diabetes Maternal Grandmother   . Diabetes Maternal Grandfather   . CAD Paternal Grandmother   . CAD Paternal Grandfather    Past Surgical History:  Procedure Laterality Date  . NO PAST SURGERIES     Patient Active Problem List   Diagnosis Date Noted  . Nonischemic cardiomyopathy (HCC) 02/23/2017  . Hypertensive heart disease with heart  failure (HCC) 02/23/2017  . HTN (hypertension) 02/21/2017  . Diabetes (HCC) 02/21/2017  . Demand ischemia (HCC) 02/12/2017  . H/O medication noncompliance 02/12/2017  . Chronic kidney disease (CKD), stage III (moderate) (HCC) 02/12/2017  . CHF (congestive heart failure) (HCC) 10/11/2014      Prior to Admission medications   Medication Sig Start Date End Date Taking? Authorizing Provider  aspirin 81 MG chewable tablet Chew 1 tablet (81 mg total) by mouth daily. 10/13/14   Milagros Loll, MD  aspirin-acetaminophen-caffeine (EXCEDRIN MIGRAINE) [phone] MG per tablet Take 0.5 tablets by mouth daily as needed for headache.     [provider]  atorvastatin (LIPITOR) 80 MG tablet Take 1 tablet (80 mg total) by mouth daily at 6 PM. 02/21/17   [name], FNP  carvedilol (COREG) 25 MG tablet Take 1 tablet (25 mg total) by mouth 2 (two) times daily with a meal. 02/21/17   [name], FNP  esomeprazole (NEXIUM) 20 MG capsule Take 1 capsule (20 mg total) by mouth daily as needed. 02/21/17   [name], FNP  furosemide (LASIX) 20 MG tablet Take 1 tablet (20 mg total) by mouth daily. 02/21/17   [name], FNP  insulin aspart (NOVOLOG) 100 UNIT/ML injection Inject 5 Units into the skin 3 (three) times daily with  meals. 02/14/17   [name], MD  insulin detemir (LEVEMIR) 100 UNIT/ML injection Inject 0.25 mLs (25 Units total) into the skin at bedtime. 02/14/17   [name], MD  sacubitril-valsartan (ENTRESTO) 49-51 MG Take 1 tablet by mouth 2 (two) times daily. 02/26/17   Antonieta Iba, MD    Allergies Nitroglycerin    Social History Social History   Tobacco Use  . Smoking status: Never Smoker  . Smokeless tobacco: Never Used  Substance Use Topics  . Alcohol use: No    Frequency: Never  . Drug use: No    Review of Systems Patient denies headaches, rhinorrhea, blurry vision, numbness, shortness of breath, chest pain, edema, cough, abdominal pain, nausea,  vomiting, diarrhea, dysuria, fevers, rashes or hallucinations unless otherwise stated above in HPI. ____________________________________________   PHYSICAL EXAM:  VITAL SIGNS: Vitals:   03/03/17 1813 03/03/17 1944  BP: (!) 180/104 (!) 178/120  Pulse:  93  Resp:    Temp:  98.4 F (36.9 C)  SpO2:  99%    Constitutional: Alert and oriented. Well appearing and in no acute distress. Eyes: Conjunctivae are normal.  Ocular pressure normal at 21.  Patient with acuity as documented by nurse.  No evidence of retinal detachment or posterior hemorrhage. Head: Atraumatic. Nose: No congestion/rhinnorhea. Mouth/Throat: Mucous membranes are moist.   Neck: No stridor. Painless ROM.  Cardiovascular: Normal rate, regular rhythm. Grossly normal heart sounds.  Good peripheral circulation. Respiratory: Normal respiratory effort.  No retractions. Lungs CTAB. Gastrointestinal: Soft and nontender. No distention. No abdominal bruits. No CVA tenderness. Genitourinary:  Musculoskeletal: No lower extremity tenderness nor edema.  No joint effusions. Neurologic:  CN- intact.  No facial droop, Normal FNF.  Normal heel to shin.  Sensation intact bilaterally. Normal speech and language. No gross focal neurologic deficits are appreciated. No gait instability. Skin:  Skin is warm, dry and intact. No rash noted. Psychiatric: Mood and affect are normal. Speech and behavior are normal.  ____________________________________________   LABS (all labs ordered are listed, but only abnormal results are displayed)  Results for orders placed or performed during the hospital encounter of 03/03/17 (from the past 24 hour(s))  CBC     Status: Abnormal   Collection Time: 03/03/17  6:15 PM  Result Value Ref Range   WBC 7.8 3.8 - 10.6 K/uL   RBC 4.35 (L) 4.40 - 5.90 MIL/uL   Hemoglobin 11.5 (L) 13.0 - 18.0 g/dL   HCT 16.1 (L) 09.6 - 04.5 %   MCV 78.9 (L) 80.0 - 100.0 fL   MCH 26.5 26.0 - 34.0 pg   MCHC 33.6 32.0 - 36.0  g/dL   RDW 40.9 81.1 - 91.4 %   Platelets 343 150 - 440 K/uL  Comprehensive metabolic panel     Status: Abnormal   Collection Time: 03/03/17  6:15 PM  Result Value Ref Range   Sodium 139 135 - 145 mmol/L   Potassium 3.8 3.5 - 5.1 mmol/L   Chloride 103 101 - 111 mmol/L   CO2 26 22 - 32 mmol/L   Glucose, Bld 166 (H) 65 - 99 mg/dL   BUN 26 (H) 6 - 20 mg/dL   Creatinine, Ser 7.82 (H) 0.61 - 1.24 mg/dL   Calcium 8.9 8.9 - 95.6 mg/dL   Total Protein 7.9 6.5 - 8.1 g/dL   Albumin 3.0 (L) 3.5 - 5.0 g/dL   AST 12 (L) 15 - 41 U/L   ALT 11 (L) 17 - 63 U/L   Alkaline  Phosphatase 70 38 - 126 U/L   Total Bilirubin 0.6 0.3 - 1.2 mg/dL   GFR calc non Af Amer 46 (L) >60 mL/min   GFR calc Af Amer 53 (L) >60 mL/min   Anion gap 10 5 - 15  Troponin I     Status: Abnormal   Collection Time: 03/03/17  6:15 PM  Result Value Ref Range   Troponin I 0.04 (HH) <0.03 ng/mL   ____________________________________________  EKG My review and personal interpretation at Time: 18:58   Indication: htn  Rate: 90  Rhythm: sinus Axis: normal Other: normal intervals, no stemi, poor rwave progression ____________________________________________  RADIOLOGY  I personally reviewed all radiographic images ordered to evaluate for the above acute complaints and reviewed radiology reports and findings.  These findings were personally discussed with the patient.  Please see medical record for radiology report.  ____________________________________________   PROCEDURES  Procedure(s) performed:  Procedures    Critical Care performed: no ____________________________________________   INITIAL IMPRESSION / ASSESSMENT AND PLAN / ED COURSE  Pertinent labs & imaging results that were available during my care of the patient were reviewed by me and considered in my medical decision making (see chart for details).  DDX: cva, dissection, detachment, macular degeneration, AACG, tension, clustion  Joel [name] is a 37  y.o. who presents to the ED with symptoms as described above.  No evidence of focal neuro deficit and certainly not clinically consistent with CVA.  Ocular exam as above.  No evidence of retinal detachment.  Visual acuity is stable at this time.  No evidence of iritis or infectious process.  Possible ocular migraine.  Not clinic consistent with subarachnoid or meningitis.  Patient is stable and appropriate for follow-up with ophthalmology.  I spoke with Dr. Brooke Dare who kindly agrees to see patient in outpatient follow-up this week.  Discussed signs and symptoms for which she should return to the ER.  Have discussed with the patient and available family all diagnostics and treatments performed thus far and all questions were answered to the best of my ability. The patient demonstrates understanding and agreement with plan.    ____________________________________________   FINAL CLINICAL IMPRESSION(S) / ED DIAGNOSES  Final diagnoses:  Change in vision  Nonintractable episodic headache, unspecified headache type      NEW MEDICATIONS STARTED DURING THIS VISIT:  New Prescriptions   No medications on file     Note:  This document was prepared using Dragon voice recognition software and may include unintentional dictation errors.    Willy Eddy, MD 03/03/17 2027

## 2017-03-03 NOTE — ED Notes (Signed)
Patient transported to CT 

## 2017-03-10 NOTE — Progress Notes (Signed)
Patient ID: Joel [name], male    DOB: 1981-02-08, 37 y.o.   MRN: [id number]  HPI  Mr Leatham is a 37 y/o male with a history of CKD, DM, HTN and chronic heart failure.   Echo report from 02/12/17 reviewed and showed an EF of 25-30% along with mild MR. EF has declined from 2016.  Was in the ED 03/03/17 due to visual disturbance where he was evaluated and released. Admitted 02/12/17 due to HF and HTN. Cardiology consult was obtained and medications were adjusted and he was discharged after 2 days.  He presents today for a follow-up visit with a chief complaint of visual disturbance in his left eye. He says that his vision occasionally goes black. Does have a history of macular degeneration and possible stroke in the past. He is currently waiting on opthalmology referral. He has associated light-headedness when this is occurs. Denies any fatigue, chest pain, cough, shortness of breath, edema, palpitations, difficulty sleeping or weight gain. Just started the entresto 49/51mg  3 days ago.   Past Medical History:  Diagnosis Date  . Chronic diastolic CHF (congestive heart failure) (HCC)   . Chronic kidney disease (CKD), stage III (moderate) (HCC)   . Diabetes mellitus with complication (HCC)   . Hyperlipidemia   . Hypertension   . Hypertensive heart disease   . Noncompliance with medications   . Obesity   . Stroke (HCC)   . Systolic dysfunction    a. TTE 06/2014: EF 45-50%, normal wall motion, mild MR, mild AI   Past Surgical History:  Procedure Laterality Date  . NO PAST SURGERIES     Family History  Problem Relation Age of Onset  . Hypertension Mother   . Hyperlipidemia Mother   . Diabetes Mother   . CAD Father        a. stenting in his early 53s  . Heart disease Father   . Stroke Father   . Diabetes Maternal Grandmother   . Diabetes Maternal Grandfather   . CAD Paternal Grandmother   . CAD Paternal Grandfather    Social History   Tobacco Use  . Smoking status: Never Smoker  .  Smokeless tobacco: Never Used  Substance Use Topics  . Alcohol use: No    Frequency: Never   Allergies  Allergen Reactions  . Nitroglycerin Other (See Comments)    Increases blood pressure and pain.   Prior to Admission medications   Medication Sig Start Date End Date Taking? Authorizing Provider  aspirin 81 MG chewable tablet Chew 1 tablet (81 mg total) by mouth daily. 10/13/14  Yes Milagros Loll, MD  aspirin-acetaminophen-caffeine (EXCEDRIN MIGRAINE) [phone] MG per tablet Take 0.5 tablets by mouth daily as needed for headache.    Yes [provider]  atorvastatin (LIPITOR) 80 MG tablet Take 1 tablet (80 mg total) by mouth daily at 6 PM. 02/21/17  Yes [name], FNP  carvedilol (COREG) 25 MG tablet Take 1 tablet (25 mg total) by mouth 2 (two) times daily with a meal. 02/21/17  Yes [name], FNP  esomeprazole (NEXIUM) 20 MG capsule Take 1 capsule (20 mg total) by mouth daily as needed. 02/21/17  Yes [name], FNP  furosemide (LASIX) 20 MG tablet Take 1 tablet (20 mg total) by mouth daily. 02/21/17  Yes Zay Yeargan, [name], FNP  insulin aspart (NOVOLOG) 100 UNIT/ML injection Inject 5 Units into the skin 3 (three) times daily with meals. 02/14/17  Yes [name], MD  insulin  detemir (LEVEMIR) 100 UNIT/ML injection Inject 0.25 mLs (25 Units total) into the skin at bedtime. 02/14/17  Yes [name], MD  sacubitril-valsartan (ENTRESTO) 49-51 MG Take 1 tablet by mouth 2 (two) times daily. 02/26/17  Yes Antonieta Iba, MD   Review of Systems  Constitutional: Positive for appetite change (decreased). Negative for fatigue.  HENT: Negative for congestion, postnasal drip and sore throat.   Eyes: Positive for visual disturbance (left eye goes black).  Respiratory: Negative for cough, chest tightness and shortness of breath.   Cardiovascular: Negative for chest pain, palpitations and leg swelling.  Gastrointestinal: Negative for abdominal distention and abdominal pain.   Endocrine: Negative.   Genitourinary: Negative.   Musculoskeletal: Positive for arthralgias (right leg is stiff). Negative for back pain.  Skin: Negative.   Allergic/Immunologic: Negative.   Neurological: Positive for light-headedness. Negative for dizziness.       Feet cramps  Hematological: Negative for adenopathy. Does not bruise/bleed easily.  Psychiatric/Behavioral: Positive for sleep disturbance (interrupted sleep).   Vitals:   03/11/17 0824  BP: (!) 143/95  Pulse: 93  Resp: 18  SpO2: 99%  Weight: 285 lb 2 oz (129.3 kg)  Height: 5\' 7"  (1.702 m)   Wt Readings from Last 3 Encounters:  03/11/17 285 lb 2 oz (129.3 kg)  03/03/17 276 lb (125.2 kg)  02/26/17 286 lb (129.7 kg)   Lab Results  Component Value Date   CREATININE 1.85 (H) 03/03/2017   CREATININE 1.87 (H) 02/25/2017   CREATININE 1.97 (H) 02/21/2017   Physical Exam  Constitutional: He is oriented to person, place, and time. He appears well-developed and well-nourished.  HENT:  Head: Normocephalic and atraumatic.  Neck: Normal range of motion. Neck supple. No JVD present.  Cardiovascular: Regular rhythm. Tachycardia present.  Pulmonary/Chest: Effort normal. He has no wheezes. He has no rales.  Abdominal: Soft. He exhibits no distension. There is no tenderness.  Musculoskeletal: He exhibits no edema or tenderness.  Neurological: He is alert and oriented to person, place, and time.  Skin: Skin is warm and dry.  Psychiatric: He has a normal mood and affect. His behavior is normal. Thought content normal.  Nursing note and vitals reviewed.   Assessment & Plan:  1: Chronic heart failure with reduced ejection fraction- - NYHA class I - euvolemic today - weighing daily and says that his weight has stabilized. Reminded to call for an overnight weight gain of >2 pounds or a weekly weight gain of >5 pounds - using Mrs. Dash for seasoning & has been reading food labels for sodium content. Discussed the importance of  keeping daily sodium intake to 2000mg  daily - started entresto 49/51mg  just a few days ago; discussed increasing dose at his next visit - 56 samples of entresto given (Lot [id number]/ EXP May/ 2020) - will check a BMP at his next visit - BNP on 02/12/17 was 309.0 - saw cardiologist Mariah Milling) 02/26/17 - patient reports receiving his flu and pneumonia vaccines for this season  2: HTN- - BP slightly better than last time - BMP from 03/03/17 was reviewed and showed sodium 139, potassium 3.8 and GFR 53 - saw PCP Ether Griffins) 04/18/16  3: Diabetes- - fasting glucose at home was 175 as he says that he forgot to take his bedtime insulin last night - A1c on 02/12/17 was 14.7 - has seen nephrologist in the past  4: Fatigue- - patient and significant other endorse snoring - currently reports no fatigue - will follow-up regarding sleep study at  his next visit  Medication bottles were reviewed.  Return in 1 month or sooner for any questions/problems before then.

## 2017-03-11 ENCOUNTER — Ambulatory Visit: Payer: Medicaid Other | Attending: Family | Admitting: Family

## 2017-03-11 ENCOUNTER — Encounter: Payer: Self-pay | Admitting: Family

## 2017-03-11 VITALS — BP 143/95 | HR 93 | Resp 18 | Ht 67.0 in | Wt 285.1 lb

## 2017-03-11 DIAGNOSIS — Z7982 Long term (current) use of aspirin: Secondary | ICD-10-CM | POA: Insufficient documentation

## 2017-03-11 DIAGNOSIS — I10 Essential (primary) hypertension: Secondary | ICD-10-CM

## 2017-03-11 DIAGNOSIS — N183 Chronic kidney disease, stage 3 unspecified: Secondary | ICD-10-CM

## 2017-03-11 DIAGNOSIS — H539 Unspecified visual disturbance: Secondary | ICD-10-CM | POA: Diagnosis present

## 2017-03-11 DIAGNOSIS — E785 Hyperlipidemia, unspecified: Secondary | ICD-10-CM | POA: Diagnosis not present

## 2017-03-11 DIAGNOSIS — R5383 Other fatigue: Secondary | ICD-10-CM | POA: Diagnosis not present

## 2017-03-11 DIAGNOSIS — Z79899 Other long term (current) drug therapy: Secondary | ICD-10-CM | POA: Insufficient documentation

## 2017-03-11 DIAGNOSIS — Z794 Long term (current) use of insulin: Secondary | ICD-10-CM | POA: Insufficient documentation

## 2017-03-11 DIAGNOSIS — E1122 Type 2 diabetes mellitus with diabetic chronic kidney disease: Secondary | ICD-10-CM | POA: Diagnosis not present

## 2017-03-11 DIAGNOSIS — I130 Hypertensive heart and chronic kidney disease with heart failure and stage 1 through stage 4 chronic kidney disease, or unspecified chronic kidney disease: Secondary | ICD-10-CM | POA: Insufficient documentation

## 2017-03-11 DIAGNOSIS — I5042 Chronic combined systolic (congestive) and diastolic (congestive) heart failure: Secondary | ICD-10-CM | POA: Insufficient documentation

## 2017-03-11 DIAGNOSIS — E669 Obesity, unspecified: Secondary | ICD-10-CM | POA: Insufficient documentation

## 2017-03-11 DIAGNOSIS — R0683 Snoring: Secondary | ICD-10-CM | POA: Insufficient documentation

## 2017-03-11 DIAGNOSIS — Z8673 Personal history of transient ischemic attack (TIA), and cerebral infarction without residual deficits: Secondary | ICD-10-CM | POA: Insufficient documentation

## 2017-03-11 DIAGNOSIS — Z8249 Family history of ischemic heart disease and other diseases of the circulatory system: Secondary | ICD-10-CM | POA: Insufficient documentation

## 2017-03-11 DIAGNOSIS — E1022 Type 1 diabetes mellitus with diabetic chronic kidney disease: Secondary | ICD-10-CM

## 2017-03-11 DIAGNOSIS — I5022 Chronic systolic (congestive) heart failure: Secondary | ICD-10-CM

## 2017-03-11 MED ORDER — CARVEDILOL 25 MG PO TABS: 25.0000 mg | ORAL_TABLET | Freq: Two times a day (BID) | ORAL | 3 refills | Status: DC

## 2017-03-11 MED ORDER — FUROSEMIDE 20 MG PO TABS: 20.0000 mg | ORAL_TABLET | Freq: Every day | ORAL | 3 refills | Status: DC

## 2017-03-11 MED ORDER — SACUBITRIL-VALSARTAN 49-51 MG PO TABS: 1.0000 | ORAL_TABLET | Freq: Two times a day (BID) | ORAL | 3 refills | Status: DC

## 2017-03-11 MED ORDER — ATORVASTATIN CALCIUM 80 MG PO TABS: 80.0000 mg | ORAL_TABLET | Freq: Every day | ORAL | 3 refills | Status: DC

## 2017-03-11 NOTE — Patient Instructions (Signed)
Continue weighing daily and call for an overnight weight gain of > 2 pounds or a weekly weight gain of >5 pounds. 

## 2017-03-20 ENCOUNTER — Ambulatory Visit: Payer: Self-pay | Admitting: Cardiovascular Disease

## 2017-03-26 ENCOUNTER — Ambulatory Visit: Payer: Self-pay | Admitting: Pharmacist

## 2017-03-26 ENCOUNTER — Ambulatory Visit: Payer: Self-pay | Admitting: Pharmacy Technician

## 2017-03-26 ENCOUNTER — Encounter: Payer: Self-pay | Admitting: Pharmacist

## 2017-03-26 ENCOUNTER — Other Ambulatory Visit: Payer: Self-pay

## 2017-03-26 VITALS — BP 138/78

## 2017-03-26 DIAGNOSIS — Z79899 Other long term (current) drug therapy: Secondary | ICD-10-CM

## 2017-03-26 NOTE — Progress Notes (Signed)
[facility] Visit Note  Patient: Joel [name] MRN: [id number] Date of Birth: November 29, 1980 PCP: Patient, No Pcp Per   [name] 37 y.o. male presents for an initial medication therapy management [facility] today.  BP 138/78   Patient Information   Past Medical History:  Diagnosis Date  . Chronic diastolic CHF (congestive heart failure) (HCC)   . Chronic kidney disease   . Diabetes mellitus with complication (HCC)   . Hyperlipidemia   . Hypertension   . Hypertensive heart disease   . Noncompliance with medications   . Obesity   . Stroke (HCC)   . Systolic dysfunction    a. TTE 06/2014: EF 45-50%, normal wall motion, mild MR, mild AI      Past Surgical History:  Procedure Laterality Date  . NO PAST SURGERIES       Family History  Problem Relation Age of Onset  . Hypertension Mother   . Hyperlipidemia Mother   . Diabetes Mother   . CAD Father        a. stenting in his early 73s  . Heart disease Father   . Stroke Father   . Diabetes Maternal Grandmother   . Diabetes Maternal Grandfather   . CAD Paternal Grandmother   . CAD Paternal Grandfather     Family Support: Good- Wife present at appointment and takes responsibility for patient's medications.   Diet: Breakfast: egg beaters, [place] sausage, [employer] wheat bread with sugar free jam, small glass of milk  Lunch:skips Dinner: backed chicken with Mrs. 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 Frequency: Never   Comment: Very infrequently       Social History   Tobacco Use  Smoking Status Never Smoker  Smokeless Tobacco  Never Used      Health Maintenance  Topic Date Due  . FOOT EXAM  10/17/1991  . OPHTHALMOLOGY EXAM  10/17/1991  . URINE MICROALBUMIN  10/17/1991  . TETANUS/TDAP  10/16/2000  . HEMOGLOBIN A1C  08/12/2017  . PNEUMOCOCCAL POLYSACCHARIDE VACCINE (2) 02/14/2022  . INFLUENZA VACCINE  Completed  . HIV Screening  Completed    Assessment and Plan:  Patient arrives with wife to appointment, and all medication bottles/insulin were reviewed.   1. Medication adherence/compliance: Good; Patient admits to missing ~2 doses/month--provided patient with new pill box at this visit because pill box currently using is getting old and the days are worn off. Patient's wife plays large role in patient's health and medication adherence--very knowledgeable about each medication.   2. Diabetes- Levemir and Novolog. Patient not at goal with most recent A1c (02/12/17) 14.7%. Reviewed with patient what A1c is and that goal is <7%. Reports occasional blurry vision and currently having issues with left eye--was recently seen in ED for this. Denies any other s/sxs of high BG such as polyuria, polydipsia, neuropathy. Patient following healthier diet to get back on track--has made appropriate changes such as choosing wheat bread instead of white, sugar free options, and snack sized chips instead of family sized. Followed by nephrology.    3. Chronic Heart Failure with Reduced Ejection Fraction- Currently followed  by [facility]-next appointment 04/07/17. Patient following low sodium diet, 2000mg /day. Using Mrs. Dash for seasoning and not adding salt to foods. Taking daily weights and knows to call HF Clinic and/or Cardiologist if weight gain >2 pounds over night or >5 pounds in a week. Was started on Entresto at last HF clinic appointment, 03/11/17, and BP 138/78 at today. Consider increasing Entresto dose at next HF Clinic appointment. Denies any s/sxs of low BP.   4. Hyperlipidemia and Past CVA: Started high  intensity statin upon most recent lipid panel results (02/13/17): TC 435, TG 267, HDL 30. LDL 352. Denies any muscle pain.   5. HTN: Uncontrolled, but significantly improved. Today's BP 138/78 mmHg with goal <130/80 mmHg. Does not currently take BP at home because cuff too small--will pick up larger cuff soon.    Patient scheduled for follow up appointment at [facility] in 3 months or sooner if any questions or concerns arise.    Cleopatra Cedar, PharmD  Pharmacy Resident

## 2017-03-26 NOTE — Progress Notes (Signed)
Met with patient completed financial assistance application for [place] due to recent ED visit.  Patient agreed to be responsible for gathering financial information and forwarding to appropriate department in [facility].    Completed [facility] application and contract.  Patient agreed to all terms of the [facility] contract.    Patient approved to receive medication assistance at [facility] through 2019, as long as eligibility continues to be met.    Provided patient with Civil engineer, contracting based on his particular needs.    Levemir and [employer] completed with patient.  Forwarded to [employer] for signature.  Upon receipt of signed applications from provider, Levemir Prescription Application will be submitted to [employer] and [place] Prescription Application will be submitted to [employer].  [facility]

## 2017-04-04 NOTE — Progress Notes (Signed)
Patient ID: Joel [name], male    DOB: May 16, 1980, 37 y.o.   MRN: [id number]  HPI  Mr Grandstaff is a 37 y/o male with a history of CKD, DM, HTN and chronic heart failure.   Echo report from 02/12/17 reviewed and showed an EF of 25-30% along with mild MR. EF has declined from 2016.  Was in the ED 03/03/17 due to visual disturbance where he was evaluated and released. Admitted 02/12/17 due to HF and HTN. Cardiology consult was obtained and medications were adjusted and he was discharged after 2 days.  He presents today for a follow-up visit with a chief complaint of minimal fatigue upon moderate exertion. He describes this as chronic in nature having been present for several years. He does feel like it's getting a little bit better. He has associated depression, difficulty sleeping and gradual weight gain along with this. Feels like his weight gain came from a week of increased appetite and his eating is now back to normal. He denies any chest pain, cough, shortness of breath, edema, palpitations, abdominal distention or dizziness.   Past Medical History:  Diagnosis Date  . Chronic diastolic CHF (congestive heart failure) (HCC)   . Chronic kidney disease   . Diabetes mellitus with complication (HCC)   . Hyperlipidemia   . Hypertension   . Hypertensive heart disease   . Noncompliance with medications   . Obesity   . Stroke (HCC)   . Systolic dysfunction    a. TTE 06/2014: EF 45-50%, normal wall motion, mild MR, mild AI   Past Surgical History:  Procedure Laterality Date  . NO PAST SURGERIES     Family History  Problem Relation Age of Onset  . Hypertension Mother   . Hyperlipidemia Mother   . Diabetes Mother   . CAD Father        a. stenting in his early 47s  . Heart disease Father   . Stroke Father   . Diabetes Maternal Grandmother   . Diabetes Maternal Grandfather   . CAD Paternal Grandmother   . CAD Paternal Grandfather    Social History   Tobacco Use  . Smoking status: Never  Smoker  . Smokeless tobacco: Never Used  Substance Use Topics  . Alcohol use: Yes    Frequency: Never    Comment: Very infrequently; ~2-3 drinks/year    Allergies  Allergen Reactions  . Nitroglycerin Other (See Comments)    Increases blood pressure and pain.   Prior to Admission medications   Medication Sig Start Date End Date Taking? Authorizing Provider  amLODipine (NORVASC) 5 MG tablet Take 5 mg by mouth daily.   Yes [provider]  aspirin 81 MG chewable tablet Chew 1 tablet (81 mg total) by mouth daily. 10/13/14  Yes Milagros Loll, MD  atorvastatin (LIPITOR) 80 MG tablet Take 1 tablet (80 mg total) by mouth daily at 6 PM. 03/11/17  Yes [name], FNP  carvedilol (COREG) 25 MG tablet Take 1 tablet (25 mg total) by mouth 2 (two) times daily with a meal. 03/11/17  Yes [name], FNP  furosemide (LASIX) 20 MG tablet Take 1 tablet (20 mg total) by mouth daily. 03/11/17  Yes Hackney, [name], FNP  insulin aspart (NOVOLOG) 100 UNIT/ML injection Inject 5 Units into the skin 3 (three) times daily with meals. 02/14/17  Yes [name], MD  insulin detemir (LEVEMIR) 100 UNIT/ML injection Inject 0.25 mLs (25 Units total) into the skin at bedtime. 02/14/17  Yes [name], MD  sacubitril-valsartan (ENTRESTO) 49-51 MG Take 1 tablet by mouth 2 (two) times daily. 03/11/17  Yes [name], FNP  aspirin-acetaminophen-caffeine (EXCEDRIN MIGRAINE) [phone] MG per tablet Take 0.5 tablets by mouth daily as needed for headache.     [provider]    Review of Systems  Constitutional: Positive for appetite change (increased) and fatigue.  HENT: Negative for congestion, postnasal drip and sore throat.   Eyes: Negative.   Respiratory: Negative for cough, chest tightness and shortness of breath.   Cardiovascular: Negative for chest pain, palpitations and leg swelling.  Gastrointestinal: Negative for abdominal distention and abdominal pain.  Endocrine: Negative.    Genitourinary: Negative.   Musculoskeletal: Positive for arthralgias (right leg is stiff). Negative for back pain.  Skin: Negative.   Allergic/Immunologic: Negative.   Neurological: Negative for dizziness and light-headedness.  Hematological: Negative for adenopathy. Does not bruise/bleed easily.  Psychiatric/Behavioral: Positive for dysphoric mood and sleep disturbance (interrupted sleep).   Vitals:   04/07/17 0842  BP: 127/89  Pulse: 91  Resp: 18  SpO2: 99%  Weight: 291 lb 4 oz (132.1 kg)  Height: 5\' 7"  (1.702 m)   Wt Readings from Last 3 Encounters:  04/07/17 291 lb 4 oz (132.1 kg)  03/11/17 285 lb 2 oz (129.3 kg)  03/03/17 276 lb (125.2 kg)   Lab Results  Component Value Date   CREATININE 1.85 (H) 03/03/2017   CREATININE 1.87 (H) 02/25/2017   CREATININE 1.97 (H) 02/21/2017    Physical Exam  Constitutional: He is oriented to person, place, and time. He appears well-developed and well-nourished.  HENT:  Head: Normocephalic and atraumatic.  Neck: Normal range of motion. Neck supple. No JVD present.  Cardiovascular: Normal rate and regular rhythm.  Pulmonary/Chest: Effort normal. He has no wheezes. He has no rales.  Abdominal: Soft. He exhibits no distension. There is no tenderness.  Musculoskeletal: He exhibits no edema or tenderness.  Neurological: He is alert and oriented to person, place, and time.  Skin: Skin is warm and dry.  Psychiatric: He has a normal mood and affect. His behavior is normal. Thought content normal.  Nursing note and vitals reviewed.   Assessment & Plan:  1: Chronic heart failure with reduced ejection fraction- - NYHA class II - euvolemic today - weighing daily and says that his weight gradually rose. Reminded to call for an overnight weight gain of >2 pounds or a weekly weight gain of >5 pounds - weight up 6 pounds since last here 03/11/17 - using Mrs. Dash for seasoning & has been reading food labels for sodium content. Encouraged to keep  daily sodium intake to 2000mg  daily - tolerating entresto without known side effects; still waiting on [facility] to get it from the company - 56 samples of entresto 49/51mg  given to patient today (Lot [id number]/ EXP Jan/2021) - will check a BMP today - BNP on 02/12/17 was 309.0 - saw cardiologist Mariah Milling) 02/26/17 - patient reports receiving his flu and pneumonia vaccines for this season  2: HTN- - BP looks good today - BMP from 03/03/17 was reviewed and showed sodium 139, potassium 3.8 and GFR 53 - saw PCP Ether Griffins) 04/18/16  3: Diabetes- - fasting glucose at home has been running 120-140's - A1c on 02/12/17 was 14.7% - has seen nephrologist in the past  4: Fatigue- - patient and significant other endorse snoring - currently without any insurance and is in the process of filling out paperwork for [facility].  Discussed that as soon as he hears something from them, to let us know and we can get sleep study scheduled. If positive for sleep apnea, can ask the charitable foundation to assist with payment for refurbished CPAP  Medication bottles were reviewed.  Return in 3 months or sooner for any questions/problems before then.

## 2017-04-07 ENCOUNTER — Encounter: Payer: Self-pay | Admitting: Family

## 2017-04-07 ENCOUNTER — Ambulatory Visit: Payer: Medicaid Other | Attending: Family | Admitting: Family

## 2017-04-07 VITALS — BP 127/89 | HR 91 | Resp 18 | Ht 67.0 in | Wt 291.2 lb

## 2017-04-07 DIAGNOSIS — I5022 Chronic systolic (congestive) heart failure: Secondary | ICD-10-CM

## 2017-04-07 DIAGNOSIS — Z6841 Body Mass Index (BMI) 40.0 and over, adult: Secondary | ICD-10-CM | POA: Diagnosis not present

## 2017-04-07 DIAGNOSIS — N183 Chronic kidney disease, stage 3 unspecified: Secondary | ICD-10-CM

## 2017-04-07 DIAGNOSIS — E785 Hyperlipidemia, unspecified: Secondary | ICD-10-CM | POA: Diagnosis not present

## 2017-04-07 DIAGNOSIS — Z7982 Long term (current) use of aspirin: Secondary | ICD-10-CM | POA: Diagnosis not present

## 2017-04-07 DIAGNOSIS — I130 Hypertensive heart and chronic kidney disease with heart failure and stage 1 through stage 4 chronic kidney disease, or unspecified chronic kidney disease: Secondary | ICD-10-CM | POA: Insufficient documentation

## 2017-04-07 DIAGNOSIS — Z833 Family history of diabetes mellitus: Secondary | ICD-10-CM | POA: Insufficient documentation

## 2017-04-07 DIAGNOSIS — Z888 Allergy status to other drugs, medicaments and biological substances status: Secondary | ICD-10-CM | POA: Insufficient documentation

## 2017-04-07 DIAGNOSIS — E1022 Type 1 diabetes mellitus with diabetic chronic kidney disease: Secondary | ICD-10-CM

## 2017-04-07 DIAGNOSIS — Z79899 Other long term (current) drug therapy: Secondary | ICD-10-CM | POA: Diagnosis not present

## 2017-04-07 DIAGNOSIS — R5382 Chronic fatigue, unspecified: Secondary | ICD-10-CM

## 2017-04-07 DIAGNOSIS — E669 Obesity, unspecified: Secondary | ICD-10-CM | POA: Insufficient documentation

## 2017-04-07 DIAGNOSIS — Z823 Family history of stroke: Secondary | ICD-10-CM | POA: Diagnosis not present

## 2017-04-07 DIAGNOSIS — R0683 Snoring: Secondary | ICD-10-CM | POA: Diagnosis not present

## 2017-04-07 DIAGNOSIS — R5383 Other fatigue: Secondary | ICD-10-CM | POA: Insufficient documentation

## 2017-04-07 DIAGNOSIS — I509 Heart failure, unspecified: Secondary | ICD-10-CM | POA: Diagnosis present

## 2017-04-07 DIAGNOSIS — Z8673 Personal history of transient ischemic attack (TIA), and cerebral infarction without residual deficits: Secondary | ICD-10-CM | POA: Insufficient documentation

## 2017-04-07 DIAGNOSIS — Z8249 Family history of ischemic heart disease and other diseases of the circulatory system: Secondary | ICD-10-CM | POA: Insufficient documentation

## 2017-04-07 DIAGNOSIS — I10 Essential (primary) hypertension: Secondary | ICD-10-CM

## 2017-04-07 DIAGNOSIS — N189 Chronic kidney disease, unspecified: Secondary | ICD-10-CM | POA: Insufficient documentation

## 2017-04-07 DIAGNOSIS — E1122 Type 2 diabetes mellitus with diabetic chronic kidney disease: Secondary | ICD-10-CM | POA: Insufficient documentation

## 2017-04-07 DIAGNOSIS — Z794 Long term (current) use of insulin: Secondary | ICD-10-CM | POA: Insufficient documentation

## 2017-04-07 LAB — BASIC METABOLIC PANEL
Anion gap: 7 (ref 5–15)
BUN: 25 mg/dL — ABNORMAL HIGH (ref 6–20)
CO2: 24 mmol/L (ref 22–32)
Calcium: 8.5 mg/dL — ABNORMAL LOW (ref 8.9–10.3)
Chloride: 103 mmol/L (ref 101–111)
Creatinine, Ser: 1.81 mg/dL — ABNORMAL HIGH (ref 0.61–1.24)
GFR calc Af Amer: 54 mL/min — ABNORMAL LOW (ref >60)
GFR calc non Af Amer: 47 mL/min — ABNORMAL LOW (ref >60)
Glucose, Bld: 137 mg/dL — ABNORMAL HIGH (ref 65–99)
Potassium: 3.5 mmol/L (ref 3.5–5.1)
Sodium: 134 mmol/L — ABNORMAL LOW (ref 135–145)

## 2017-04-07 NOTE — Patient Instructions (Signed)
Continue weighing daily and call for an overnight weight gain of > 2 pounds or a weekly weight gain of >5 pounds. 

## 2017-04-21 ENCOUNTER — Telehealth: Payer: Self-pay | Admitting: Pharmacist

## 2017-04-21 NOTE — Telephone Encounter (Signed)
04/21/2017 2:39:07 PM - [name] 49/51  04/21/17 I have received the forms from [facility] for [name] 49/51, faxed to [employer] for processing, to ship to [place].Joel [name]

## 2017-04-21 NOTE — Telephone Encounter (Signed)
04/21/2017 2:10:00 PM - Levemir Vial  04/21/17 Printed [employer] application with Dr. Mariah Milling as provider will take to his office to see if he will sign, patient does not have a PCP. Clarisa Kindred would not sign for this Levemir Vial.AJ

## 2017-04-24 ENCOUNTER — Telehealth: Payer: Self-pay | Admitting: Cardiovascular Disease

## 2017-04-24 NOTE — Telephone Encounter (Signed)
Joel [name] from [facility] dropped off form to be completed and signed Placed in nurse box

## 2017-04-25 NOTE — Telephone Encounter (Signed)
Spoke with Joel [name] over at [facility] and she reports that patient does not have a PCP and would like to see if we could sign for his insulin. Advised that I would check with Dr. Mariah Milling and call her back.

## 2017-04-25 NOTE — Telephone Encounter (Signed)
Can we call him to help arrange primary care physician Tell him we are covering his insulin for short period of time until he establishes care with primary care Alternatively he can establish with endocrinology that will manage his diabetes

## 2017-04-25 NOTE — Telephone Encounter (Signed)
Spoke with patients wife per release form and reviewed that we signed this initial form but that he will need to find a primary care provider for any further refills. She verbalized understanding with no further questions at this time.

## 2017-05-05 ENCOUNTER — Telehealth: Payer: Self-pay | Admitting: Pharmacist

## 2017-05-05 NOTE — Telephone Encounter (Signed)
05/05/2017 2:41:45 PM - Levemir Vial  05/05/17 Faxed [employer] application for [employer] daily dose 25 units - Inject 25 units under the skin daily at bedtime #3 vials, for enrollment.[name]

## 2017-05-21 ENCOUNTER — Telehealth: Payer: Self-pay | Admitting: Pharmacist

## 2017-05-21 NOTE — Telephone Encounter (Signed)
05/21/2017 10:14:36 AM - [name] status  05-21-17 Received note from Keri to call wife [phone] on status of Entresto. I called [employer] to check on status of application I faxed 04/21/17. Spoke with [employer], he told me that patient was approved 04/23/17 for 1 year, they always fax a letter to provider-the fax failed. He also stated the pharmacy had called patient home 04/29/17 and left message to schedule delivery, have not received a call back. I explained on the application we had to [facility] TO PROVIDER he explained that they normally ship to patient, he placed me on hold and spoke with the pharmacy, came back with [place] on the line, she apologized that the note to ship to provider was overlooked, she has corrected. While on the phone she set up shipment to go out today 05/21/17 and delivery by [employer] to our office on Friday, 05/23/17. I then called wife Shanda Bumps) and explained this to her, I explained if she wanted to call us on Friday after lunch to see if we received.[name]

## 2017-06-26 ENCOUNTER — Encounter: Payer: Self-pay | Admitting: Pharmacist

## 2017-06-26 ENCOUNTER — Other Ambulatory Visit: Payer: Self-pay

## 2017-06-26 ENCOUNTER — Ambulatory Visit: Payer: Self-pay | Admitting: Pharmacist

## 2017-06-26 ENCOUNTER — Encounter (INDEPENDENT_AMBULATORY_CARE_PROVIDER_SITE_OTHER): Payer: Self-pay

## 2017-06-26 VITALS — BP 150/90 | Ht 67.0 in | Wt 316.0 lb

## 2017-06-26 DIAGNOSIS — Z79899 Other long term (current) drug therapy: Secondary | ICD-10-CM

## 2017-06-26 NOTE — Progress Notes (Addendum)
[facility]ication Management Clinic Visit Note  Patient: Joel [name] MRN: [id number] Date of Birth: 06-09-1980 PCP: Patient, No Pcp Per   [name] 37 y.o. male presents for a MTM visit today.  BP (!) 150/90 (BP Location: Right Arm, Patient Position: Sitting, Cuff Size: Large)   Wt (!) 316 lb (143.3 kg)   BMI 49.49 kg/m   Patient Information   Past Medical History:  Diagnosis Date  . Chronic diastolic CHF (congestive heart failure) (HCC)   . Chronic kidney disease   . Diabetes mellitus with complication (HCC)   . Hyperlipidemia   . Hypertension   . Hypertensive heart disease   . Noncompliance with medications   . Obesity   . Stroke (HCC)   . Systolic dysfunction    a. TTE 06/2014: EF 45-50%, normal wall motion, mild MR, mild AI      Past Surgical History:  Procedure Laterality Date  . NO PAST SURGERIES       Family History  Problem Relation Age of Onset  . Hypertension Mother   . Hyperlipidemia Mother   . Diabetes Mother   . CAD Father        a. stenting in his early 28s  . Heart disease Father   . Stroke Father   . Diabetes Maternal Grandmother   . Diabetes Maternal Grandfather   . CAD Paternal Grandmother   . CAD Paternal Grandfather     Outpatient Encounter Medications as of 06/26/2017  Medication Sig  . amLODipine (NORVASC) 5 MG tablet Take 5 mg by mouth daily.  Marland Kitchen aspirin 81 MG chewable tablet Chew 1 tablet (81 mg total) by mouth daily.  Marland Kitchen atorvastatin (LIPITOR) 80 MG tablet Take 1 tablet (80 mg total) by mouth daily at 6 PM.  . carvedilol (COREG) 25 MG tablet Take 1 tablet (25 mg total) by mouth 2 (two) times daily with a meal.  . furosemide (LASIX) 20 MG tablet Take 1 tablet (20 mg total) by mouth daily.  . insulin aspart (NOVOLOG) 100 UNIT/ML injection Inject 5 Units into the skin 3 (three) times daily with meals.  . insulin detemir (LEVEMIR) 100 UNIT/ML injection Inject 0.25 mLs (25 Units total) into the skin at bedtime.  Marland Kitchen omeprazole (PRILOSEC) 20 MG  capsule Take 20 mg by mouth daily.  . sacubitril-valsartan (ENTRESTO) 49-51 MG Take 1 tablet by mouth 2 (two) times daily.  Marland Kitchen aspirin-acetaminophen-caffeine (EXCEDRIN MIGRAINE) 250-250-65 MG per tablet Take 0.5 tablets by mouth daily as needed for headache.    No facility-administered encounter medications on file as of 06/26/2017.     Family Support: His wife was with him for this appointment. She comes with him to all his appointments and she is very supportive and knowledgeable about his care and meds.   Lifestyle Diet: Breakfast: sometimes doesn't eat but his first meal is egg beaters with [place] bacon Lunch: Lean ground beef or chicken to make the meatballs in spaghetti with no sugar spaghetti sauce. Baked or grilled chicken with coconut/olive oil and Mrs. Dash seasoning or no salt seasoning.  Dinner: usually the same as lunch.  Usually has 2 meals/day.   Exercise: Patient reports not exercising as much as he used to but reports he does yard work for his in-laws sometimes and works as a Geologist, engineering.             Social History   Substance and Sexual Activity  Alcohol Use Yes  . Frequency: Never   Comment: Very infrequently; ~2-3 drinks/year  Social History   Tobacco Use  Smoking Status Never Smoker  Smokeless Tobacco Never Used      Health Maintenance  Topic Date Due  . FOOT EXAM  10/17/1991  . OPHTHALMOLOGY EXAM  10/17/1991  . URINE MICROALBUMIN  10/17/1991  . TETANUS/TDAP  10/16/2000  . HEMOGLOBIN A1C  08/12/2017  . INFLUENZA VACCINE  09/11/2017  . PNEUMOCOCCAL POLYSACCHARIDE VACCINE (2) 02/14/2022  . HIV Screening  Completed     Assessment and Plan:  Medication adherence/compliance: With the help of his wife, patient is very compliant and adherent to his medications. He is really trying to do better for his health   Appetite: Patient reported for the past 2 weeks he all the sudden starting feeling hungry and snacking throughout the day. This is not his norm  as he only has 2 meals per day. He takes the medications the same as he always has. Told him and his wife to tell Clarisa Kindred about this and I will reach out to Fay as well.  CHF/HTN: Patient is on amlodipine 5mg  daily, aspirin 81mg  daily, atorvastatin 80mg  daily, carvedilol 25mg  BID, and Entresto 49/51mg  BID. Although the patient took his meds this morning and his BP was 150/90, patient reports this is a good BP for him as he normally runs high (180-200/90)  DM: Patient is on Levemir 25 units at bedtime and Novolog 5 units TID. The patient did report that there was one day where he did have hypoglycemia but this was immediately treated with some juice. He reports that he has only had low BG that one time but knows what to do if it happens again. His wife says they will check blood glucose whenever he is feeling sick or running low. Other than the appetite change, patient reports no side effects with his meds at this time  Omeprazole: Patient's wife reports he is taking Omeprazole 20mg . Will ask Clarisa Kindred for a prescription to help with cost since this can be provided at n/c here at [facility].  Patient is doing very well and has wonderful humor. Would like to follow up in 6 months to assess progress and changes to medications.  [facility] [place] PharmD Candidate 2019  Cosigned:Christan Leonor Liv, PharmD, RPh [facility]) [phone]

## 2017-06-29 NOTE — Progress Notes (Signed)
Patient ID: Joel [name], male    DOB: 1980/11/10, 37 y.o.   MRN: [id number]  HPI  Joel [name] is a 37 y/o male with a history of CKD, DM, HTN and chronic heart failure.   Echo report from 02/12/17 reviewed and showed an EF of 25-30% along with mild Joel. EF has declined from 2016.  Was in the ED 03/03/17 due to visual disturbance where he was evaluated and released. Admitted 02/12/17 due to HF and HTN. Cardiology consult was obtained and medications were adjusted and he was discharged after 2 days.  He presents today for a follow-up visit with a chief complaint of minimal fatigue upon moderate exertion. He describes this as chronic in nature having been present for several years. He has associated intermittent blurry vision along with this. He denies any difficulty sleeping, abdominal distention, palpitations, pedal edema, chest pain, shortness of breath, cough or dizziness. Had been gaining weight with an increased appetite and he didn't know why until he realized that he had been using expired insulin for ~ 2 weeks. Since he got new insulin, he's dropped 10 pounds in the last 4 days.   Past Medical History:  Diagnosis Date  . Chronic diastolic CHF (congestive heart failure) (HCC)   . Chronic kidney disease   . Diabetes mellitus with complication (HCC)   . Hyperlipidemia   . Hypertension   . Hypertensive heart disease   . Noncompliance with medications   . Obesity   . Stroke (HCC)   . Systolic dysfunction    a. TTE 06/2014: EF 45-50%, normal wall motion, mild Joel, mild AI   Past Surgical History:  Procedure Laterality Date  . NO PAST SURGERIES     Family History  Problem Relation Age of Onset  . Hypertension Mother   . Hyperlipidemia Mother   . Diabetes Mother   . CAD Father        a. stenting in his early 53s  . Heart disease Father   . Stroke Father   . Diabetes Maternal Grandmother   . Diabetes Maternal Grandfather   . CAD Paternal Grandmother   . CAD Paternal Grandfather     Social History   Tobacco Use  . Smoking status: Never Smoker  . Smokeless tobacco: Never Used  Substance Use Topics  . Alcohol use: Yes    Frequency: Never    Comment: Very infrequently; ~2-3 drinks/year    Allergies  Allergen Reactions  . Nitroglycerin Other (See Comments)    Causes severe headaches   Prior to Admission medications   Medication Sig Start Date End Date Taking? Authorizing Provider  amLODipine (NORVASC) 5 MG tablet Take 5 mg by mouth daily.   Yes [provider]  aspirin 81 MG chewable tablet Chew 1 tablet (81 mg total) by mouth daily. 10/13/14  Yes Milagros Loll, MD  aspirin-acetaminophen-caffeine (EXCEDRIN MIGRAINE) [phone] MG per tablet Take 0.5 tablets by mouth daily as needed for headache.    Yes [provider]  atorvastatin (LIPITOR) 80 MG tablet Take 1 tablet (80 mg total) by mouth daily at 6 PM. 03/11/17  Yes [name], FNP  carvedilol (COREG) 25 MG tablet Take 1 tablet (25 mg total) by mouth 2 (two) times daily with a meal. 03/11/17  Yes [name], FNP  furosemide (LASIX) 20 MG tablet Take 1 tablet (20 mg total) by mouth daily. 03/11/17  Yes Valinda Fedie, [name], FNP  insulin aspart (NOVOLOG) 100 UNIT/ML injection Inject 5 Units into  the skin 3 (three) times daily with meals. 02/14/17  Yes [name], MD  insulin detemir (LEVEMIR) 100 UNIT/ML injection Inject 0.25 mLs (25 Units total) into the skin at bedtime. 02/14/17  Yes [name], MD  omeprazole (PRILOSEC) 20 MG capsule Take 20 mg by mouth daily.   Yes [provider]  sacubitril-valsartan (ENTRESTO) 49-51 MG Take 1 tablet by mouth 2 (two) times daily. 03/11/17  Yes [name], FNP    Review of Systems  Constitutional: Positive for fatigue. Negative for appetite change.  HENT: Negative for congestion, postnasal drip and sore throat.   Eyes: Positive for visual disturbance (blurry vision).  Respiratory: Negative for cough, chest tightness and shortness of  breath.   Cardiovascular: Negative for chest pain, palpitations and leg swelling.  Gastrointestinal: Negative for abdominal distention and abdominal pain.  Endocrine: Negative.   Genitourinary: Negative.   Musculoskeletal: Negative for back pain and neck pain.  Skin: Negative.   Allergic/Immunologic: Negative.   Neurological: Negative for dizziness and light-headedness.  Hematological: Negative for adenopathy. Does not bruise/bleed easily.  Psychiatric/Behavioral: Negative for dysphoric mood and sleep disturbance.   Vitals:   06/30/17 0844  BP: (!) 127/95  Pulse: 90  Resp: 18  SpO2: 99%  Weight: (!) 307 lb 2 oz (139.3 kg)  Height: 5\' 7"  (1.702 m)   Wt Readings from Last 3 Encounters:  06/30/17 (!) 307 lb 2 oz (139.3 kg)  06/26/17 (!) 316 lb (143.3 kg)  04/07/17 291 lb 4 oz (132.1 kg)   Lab Results  Component Value Date   CREATININE 1.81 (H) 04/07/2017   CREATININE 1.85 (H) 03/03/2017   CREATININE 1.87 (H) 02/25/2017    Physical Exam  Constitutional: He is oriented to person, place, and time. He appears well-developed and well-nourished.  HENT:  Head: Normocephalic and atraumatic.  Neck: Normal range of motion. Neck supple. No JVD present.  Cardiovascular: Normal rate and regular rhythm.  Pulmonary/Chest: Effort normal. He has no wheezes. He has no rales.  Abdominal: Soft. He exhibits no distension. There is no tenderness.  Musculoskeletal: He exhibits no edema or tenderness.  Neurological: He is alert and oriented to person, place, and time.  Skin: Skin is warm and dry.  Psychiatric: He has a normal mood and affect. His behavior is normal. Thought content normal.  Nursing note and vitals reviewed.   Assessment & Plan:  1: Chronic heart failure with reduced ejection fraction- - NYHA class II - euvolemic today - weighing daily and says that his weight has gradually rose. Reminded to call for an overnight weight gain of >2 pounds or a weekly weight gain of >5  pounds - weight up 16 pounds since last here as he says that he's unknowingly been using expired insulin; says that in the last 4 days, however, he has dropped 10 pounds with getting new insulin - using Mrs. Dash for seasoning & has been reading food labels for sodium content. Encouraged to keep daily sodium intake to 2000mg  daily - will increase his entresto to 97/103mg  twice daily. Advise patient that he could finish up the 49/51mg  dose by taking 2 tablets twice daily until gone. New RX for higher dose sent to pharmacy - discussed adding bidil at his next visit and stopping amlodipine - will check a BMP at his next visit - BNP on 02/12/17 was 309.0 - saw cardiologist Mariah Milling) 02/26/17  2: HTN- - diastolic BP elevated today; increasing entresto per above - BMP from 04/07/17 was reviewed and showed  sodium 134, potassium 3.5 and GFR 54 - saw PCP Ether Griffins) 04/18/16  3: Diabetes- - fasting glucose at home has been running high due to issues with expired insulin - fasting glucose in clinic today was 398 - A1c on 02/12/17 was 14.7% - has seen nephrologist in the past  4: Fatigue- - patient and significant other endorse snoring - currently without any insurance and is in the process of filling out paperwork for [facility]. Discussed that as soon as he hears something from them, to let us know and we can get sleep study scheduled. If positive for sleep apnea, can ask the charitable foundation to assist with payment for refurbished CPAP  Medication bottles were reviewed.  Return in 1 month or sooner for any questions/problems before then.

## 2017-06-30 ENCOUNTER — Encounter: Payer: Self-pay | Admitting: Family

## 2017-06-30 ENCOUNTER — Ambulatory Visit: Payer: Medicaid Other | Attending: Family | Admitting: Family

## 2017-06-30 VITALS — BP 127/95 | HR 90 | Resp 18 | Ht 67.0 in | Wt 307.1 lb

## 2017-06-30 DIAGNOSIS — Z794 Long term (current) use of insulin: Secondary | ICD-10-CM | POA: Insufficient documentation

## 2017-06-30 DIAGNOSIS — Z79899 Other long term (current) drug therapy: Secondary | ICD-10-CM | POA: Insufficient documentation

## 2017-06-30 DIAGNOSIS — I5032 Chronic diastolic (congestive) heart failure: Secondary | ICD-10-CM | POA: Insufficient documentation

## 2017-06-30 DIAGNOSIS — R5383 Other fatigue: Secondary | ICD-10-CM | POA: Diagnosis not present

## 2017-06-30 DIAGNOSIS — N183 Chronic kidney disease, stage 3 unspecified: Secondary | ICD-10-CM

## 2017-06-30 DIAGNOSIS — I10 Essential (primary) hypertension: Secondary | ICD-10-CM

## 2017-06-30 DIAGNOSIS — Z9114 Patient's other noncompliance with medication regimen: Secondary | ICD-10-CM | POA: Insufficient documentation

## 2017-06-30 DIAGNOSIS — Z8673 Personal history of transient ischemic attack (TIA), and cerebral infarction without residual deficits: Secondary | ICD-10-CM | POA: Diagnosis not present

## 2017-06-30 DIAGNOSIS — I5022 Chronic systolic (congestive) heart failure: Secondary | ICD-10-CM

## 2017-06-30 DIAGNOSIS — Z7982 Long term (current) use of aspirin: Secondary | ICD-10-CM | POA: Diagnosis not present

## 2017-06-30 DIAGNOSIS — E1022 Type 1 diabetes mellitus with diabetic chronic kidney disease: Secondary | ICD-10-CM | POA: Diagnosis present

## 2017-06-30 DIAGNOSIS — E1122 Type 2 diabetes mellitus with diabetic chronic kidney disease: Secondary | ICD-10-CM | POA: Diagnosis not present

## 2017-06-30 DIAGNOSIS — E785 Hyperlipidemia, unspecified: Secondary | ICD-10-CM | POA: Diagnosis not present

## 2017-06-30 DIAGNOSIS — R5382 Chronic fatigue, unspecified: Secondary | ICD-10-CM

## 2017-06-30 DIAGNOSIS — I130 Hypertensive heart and chronic kidney disease with heart failure and stage 1 through stage 4 chronic kidney disease, or unspecified chronic kidney disease: Secondary | ICD-10-CM | POA: Insufficient documentation

## 2017-06-30 DIAGNOSIS — E669 Obesity, unspecified: Secondary | ICD-10-CM | POA: Diagnosis not present

## 2017-06-30 DIAGNOSIS — Z8249 Family history of ischemic heart disease and other diseases of the circulatory system: Secondary | ICD-10-CM | POA: Diagnosis not present

## 2017-06-30 LAB — GLUCOSE, CAPILLARY: Glucose-Capillary: 398 mg/dL — ABNORMAL HIGH (ref 65–99)

## 2017-06-30 MED ORDER — SACUBITRIL-VALSARTAN 97-103 MG PO TABS: 1.0000 | ORAL_TABLET | Freq: Two times a day (BID) | ORAL | 3 refills | Status: DC

## 2017-06-30 NOTE — Patient Instructions (Addendum)
Continue weighing daily and call for an overnight weight gain of > 2 pounds or a weekly weight gain of >5 pounds.  Double current entresto dose by taking 2 tablets twice daily.

## 2017-07-24 ENCOUNTER — Telehealth: Payer: Self-pay | Admitting: Pharmacist

## 2017-07-24 NOTE — Telephone Encounter (Signed)
07/24/2017 9:18:51 AM - Joel [name] 97/103  07/24/17 Faxed page 3 of application with new dose Entresto 97/103 - Take 1 tablet by mouth two times a day. [name]

## 2017-08-07 ENCOUNTER — Ambulatory Visit: Payer: Medicaid Other | Attending: Family | Admitting: Family

## 2017-08-07 ENCOUNTER — Encounter: Payer: Self-pay | Admitting: Family

## 2017-08-07 VITALS — BP 150/109 | HR 92 | Resp 18 | Ht 67.0 in | Wt 298.4 lb

## 2017-08-07 DIAGNOSIS — E669 Obesity, unspecified: Secondary | ICD-10-CM | POA: Diagnosis not present

## 2017-08-07 DIAGNOSIS — Z794 Long term (current) use of insulin: Secondary | ICD-10-CM | POA: Insufficient documentation

## 2017-08-07 DIAGNOSIS — Z6841 Body Mass Index (BMI) 40.0 and over, adult: Secondary | ICD-10-CM | POA: Diagnosis not present

## 2017-08-07 DIAGNOSIS — E1122 Type 2 diabetes mellitus with diabetic chronic kidney disease: Secondary | ICD-10-CM | POA: Insufficient documentation

## 2017-08-07 DIAGNOSIS — E785 Hyperlipidemia, unspecified: Secondary | ICD-10-CM | POA: Diagnosis not present

## 2017-08-07 DIAGNOSIS — Z7982 Long term (current) use of aspirin: Secondary | ICD-10-CM | POA: Insufficient documentation

## 2017-08-07 DIAGNOSIS — Z8673 Personal history of transient ischemic attack (TIA), and cerebral infarction without residual deficits: Secondary | ICD-10-CM | POA: Diagnosis not present

## 2017-08-07 DIAGNOSIS — Z79899 Other long term (current) drug therapy: Secondary | ICD-10-CM | POA: Insufficient documentation

## 2017-08-07 DIAGNOSIS — Z823 Family history of stroke: Secondary | ICD-10-CM | POA: Insufficient documentation

## 2017-08-07 DIAGNOSIS — N189 Chronic kidney disease, unspecified: Secondary | ICD-10-CM | POA: Diagnosis not present

## 2017-08-07 DIAGNOSIS — H538 Other visual disturbances: Secondary | ICD-10-CM | POA: Insufficient documentation

## 2017-08-07 DIAGNOSIS — I130 Hypertensive heart and chronic kidney disease with heart failure and stage 1 through stage 4 chronic kidney disease, or unspecified chronic kidney disease: Secondary | ICD-10-CM | POA: Insufficient documentation

## 2017-08-07 DIAGNOSIS — R5383 Other fatigue: Secondary | ICD-10-CM | POA: Diagnosis not present

## 2017-08-07 DIAGNOSIS — Z833 Family history of diabetes mellitus: Secondary | ICD-10-CM | POA: Diagnosis not present

## 2017-08-07 DIAGNOSIS — Z8249 Family history of ischemic heart disease and other diseases of the circulatory system: Secondary | ICD-10-CM | POA: Diagnosis not present

## 2017-08-07 DIAGNOSIS — R5382 Chronic fatigue, unspecified: Secondary | ICD-10-CM

## 2017-08-07 DIAGNOSIS — R0683 Snoring: Secondary | ICD-10-CM | POA: Diagnosis not present

## 2017-08-07 DIAGNOSIS — I5022 Chronic systolic (congestive) heart failure: Secondary | ICD-10-CM

## 2017-08-07 DIAGNOSIS — I509 Heart failure, unspecified: Secondary | ICD-10-CM | POA: Diagnosis present

## 2017-08-07 DIAGNOSIS — I10 Essential (primary) hypertension: Secondary | ICD-10-CM

## 2017-08-07 DIAGNOSIS — E1022 Type 1 diabetes mellitus with diabetic chronic kidney disease: Secondary | ICD-10-CM

## 2017-08-07 DIAGNOSIS — Z888 Allergy status to other drugs, medicaments and biological substances status: Secondary | ICD-10-CM | POA: Insufficient documentation

## 2017-08-07 DIAGNOSIS — N183 Chronic kidney disease, stage 3 unspecified: Secondary | ICD-10-CM

## 2017-08-07 NOTE — Progress Notes (Signed)
Patient ID: Joel [name], male    DOB: 1980-10-06, 37 y.o.   MRN: [id number]  HPI  Joel [name] is a 37 y/o male with a history of CKD, DM, HTN and chronic heart failure.   Echo report from 02/12/17 reviewed and showed an EF of 25-30% along with mild Joel. EF has declined from 2016.  Was in the ED 03/03/17 due to visual disturbance where he was evaluated and released. Admitted 02/12/17 due to HF and HTN. Cardiology consult was obtained and medications were adjusted and he was discharged after 2 days.  He presents today for a follow-up visit with a chief complaint of blurry vision. He describes this as chronic having been present for several months. He has no associated symptoms along with this. He currently denies any difficulty sleeping, abdominal distention, palpitations, pedal edema, chest pain, shortness of breath, cough, dizziness, fatigue or weight gain. He has been out of entresto for the last 6 days and says that it'll be ~ 2 more weeks before medication management clinic gets the medicine for him.   Past Medical History:  Diagnosis Date  . Chronic diastolic CHF (congestive heart failure) (HCC)   . Chronic kidney disease   . Diabetes mellitus with complication (HCC)   . Hyperlipidemia   . Hypertension   . Hypertensive heart disease   . Noncompliance with medications   . Obesity   . Stroke (HCC)   . Systolic dysfunction    a. TTE 06/2014: EF 45-50%, normal wall motion, mild Joel, mild AI   Past Surgical History:  Procedure Laterality Date  . NO PAST SURGERIES     Family History  Problem Relation Age of Onset  . Hypertension Mother   . Hyperlipidemia Mother   . Diabetes Mother   . CAD Father        a. stenting in his early 53s  . Heart disease Father   . Stroke Father   . Diabetes Maternal Grandmother   . Diabetes Maternal Grandfather   . CAD Paternal Grandmother   . CAD Paternal Grandfather    Social History   Tobacco Use  . Smoking status: Never Smoker  . Smokeless tobacco:  Never Used  Substance Use Topics  . Alcohol use: Yes    Frequency: Never    Comment: Very infrequently; ~2-3 drinks/year    Allergies  Allergen Reactions  . Nitroglycerin Other (See Comments)    Causes severe headaches   Prior to Admission medications   Medication Sig Start Date End Date Taking? Authorizing Provider  amLODipine (NORVASC) 5 MG tablet Take 5 mg by mouth daily.   Yes [provider]  aspirin 81 MG chewable tablet Chew 1 tablet (81 mg total) by mouth daily. 10/13/14  Yes Milagros Loll, MD  aspirin-acetaminophen-caffeine (EXCEDRIN MIGRAINE) [phone] MG per tablet Take 0.5 tablets by mouth daily as needed for headache.    Yes [provider]  atorvastatin (LIPITOR) 80 MG tablet Take 1 tablet (80 mg total) by mouth daily at 6 PM. 03/11/17  Yes [name], FNP  carvedilol (COREG) 25 MG tablet Take 1 tablet (25 mg total) by mouth 2 (two) times daily with a meal. 03/11/17  Yes [name], FNP  furosemide (LASIX) 20 MG tablet Take 1 tablet (20 mg total) by mouth daily. 03/11/17  Yes Hackney, [name], FNP  insulin aspart (NOVOLOG) 100 UNIT/ML injection Inject 5 Units into the skin 3 (three) times daily with meals. 02/14/17  Yes [name], MD  insulin detemir (LEVEMIR) 100 UNIT/ML injection Inject 0.25 mLs (25 Units total) into the skin at bedtime. 02/14/17  Yes [name], MD  omeprazole (PRILOSEC) 20 MG capsule Take 20 mg by mouth daily.   Yes [provider]  sacubitril-valsartan (ENTRESTO) 97-103 MG Take 1 tablet by mouth 2 (two) times daily. Patient not taking: Reported on 08/07/2017 06/30/17   [name], FNP    Review of Systems  Constitutional: Negative for appetite change and fatigue.  HENT: Negative for congestion, postnasal drip and sore throat.   Eyes: Positive for visual disturbance (blurry vision).  Respiratory: Negative for cough, chest tightness and shortness of breath.   Cardiovascular: Negative for chest pain,  palpitations and leg swelling.  Gastrointestinal: Negative for abdominal distention and abdominal pain.  Endocrine: Negative.   Genitourinary: Negative.   Musculoskeletal: Negative for back pain and neck pain.  Skin: Negative.   Allergic/Immunologic: Negative.   Neurological: Negative for dizziness and light-headedness.  Hematological: Negative for adenopathy. Does not bruise/bleed easily.  Psychiatric/Behavioral: Negative for dysphoric mood and sleep disturbance.   Vitals:   08/07/17 0854  BP: (!) 150/109  Pulse: 92  Resp: 18  SpO2: 98%  Weight: 298 lb 6 oz (135.3 kg)  Height: 5\' 7"  (1.702 m)   Wt Readings from Last 3 Encounters:  08/07/17 298 lb 6 oz (135.3 kg)  06/30/17 (!) 307 lb 2 oz (139.3 kg)  06/26/17 (!) 316 lb (143.3 kg)   Lab Results  Component Value Date   CREATININE 1.81 (H) 04/07/2017   CREATININE 1.85 (H) 03/03/2017   CREATININE 1.87 (H) 02/25/2017   Physical Exam  Constitutional: He is oriented to person, place, and time. He appears well-developed and well-nourished.  HENT:  Head: Normocephalic and atraumatic.  Neck: Normal range of motion. Neck supple. No JVD present.  Cardiovascular: Normal rate and regular rhythm.  Pulmonary/Chest: Effort normal. He has no wheezes. He has no rales.  Abdominal: Soft. He exhibits no distension. There is no tenderness.  Musculoskeletal: He exhibits no edema or tenderness.  Neurological: He is alert and oriented to person, place, and time.  Skin: Skin is warm and dry.  Psychiatric: He has a normal mood and affect. His behavior is normal. Thought content normal.  Nursing note and vitals reviewed.   Assessment & Plan:  1: Chronic heart failure with reduced ejection fraction- - NYHA class I - euvolemic today - weighing daily and says that his weight has gradually declined. Reminded to call for an overnight weight gain of >2 pounds or a weekly weight gain of >5 pounds - weight down 9 pounds since he was last here ~ 6  weeks ago - using Mrs. Dash for seasoning & has been reading food labels for sodium content. Encouraged to keep daily sodium intake to 2000mg  daily - enough samples provided of entresto 49/51mg  so that he can take 2 tablets twice daily until he receives the 97/103mg  dose from medication management clinic - since he's been out of the medication for 6 days, will not check a BMP today but will do so at his next visit  - consider adding bidil - BNP on 02/12/17 was 309.0 - saw cardiologist Mariah Milling) 02/26/17  2: HTN- - BP elevated today but he's been completely out of entresto for the last 6 days - BMP from 04/07/17 was reviewed and showed sodium 134, potassium 3.5 and GFR 54 - saw PCP Ether Griffins) 04/18/16  3: Diabetes- - fasting glucose at home has improved  - A1c  on 02/12/17 was 14.7% - has seen nephrologist in the past - information regarding [facility] given to him  4: Fatigue- - patient and significant other endorse snoring - currently without any insurance and says that they've fill out paperwork for [facility] but haven't heard anything yet. Encouraged them to call back and follow-up to see where in the process they were at. Discussed that as soon as he hears something from them, to let us know and we can get sleep study scheduled. If positive for sleep apnea, can ask the charitable foundation to assist with payment for refurbished CPAP  Medication bottles were reviewed.  Return in 3 weeks or sooner for any questions/problems before then.

## 2017-08-07 NOTE — Patient Instructions (Addendum)
Continue weighing daily and call for an overnight weight gain of > 2 pounds or a weekly weight gain of >5 pounds.  Bring medication bottles to every visit  [facility]   [phone]  [address] [place] [address]  Hours: Tuesday: 4:15 pm - 7:30 pm  Wednesday: 9 am - 1 pm  Thursday: 1 pm - 7:30 pm  Friday - Monday: CLOSED

## 2017-08-25 ENCOUNTER — Encounter: Payer: Self-pay | Admitting: Family

## 2017-08-25 ENCOUNTER — Ambulatory Visit: Payer: Medicaid Other | Attending: Family | Admitting: Family

## 2017-08-25 ENCOUNTER — Telehealth: Payer: Self-pay | Admitting: Cardiology

## 2017-08-25 VITALS — BP 155/109 | HR 101 | Resp 18 | Wt 303.6 lb

## 2017-08-25 DIAGNOSIS — E785 Hyperlipidemia, unspecified: Secondary | ICD-10-CM | POA: Insufficient documentation

## 2017-08-25 DIAGNOSIS — Z8249 Family history of ischemic heart disease and other diseases of the circulatory system: Secondary | ICD-10-CM | POA: Diagnosis not present

## 2017-08-25 DIAGNOSIS — I10 Essential (primary) hypertension: Secondary | ICD-10-CM

## 2017-08-25 DIAGNOSIS — Z8673 Personal history of transient ischemic attack (TIA), and cerebral infarction without residual deficits: Secondary | ICD-10-CM | POA: Diagnosis not present

## 2017-08-25 DIAGNOSIS — E1022 Type 1 diabetes mellitus with diabetic chronic kidney disease: Secondary | ICD-10-CM

## 2017-08-25 DIAGNOSIS — I130 Hypertensive heart and chronic kidney disease with heart failure and stage 1 through stage 4 chronic kidney disease, or unspecified chronic kidney disease: Secondary | ICD-10-CM | POA: Insufficient documentation

## 2017-08-25 DIAGNOSIS — Z794 Long term (current) use of insulin: Secondary | ICD-10-CM | POA: Diagnosis not present

## 2017-08-25 DIAGNOSIS — I5022 Chronic systolic (congestive) heart failure: Secondary | ICD-10-CM

## 2017-08-25 DIAGNOSIS — E1122 Type 2 diabetes mellitus with diabetic chronic kidney disease: Secondary | ICD-10-CM | POA: Diagnosis not present

## 2017-08-25 DIAGNOSIS — H538 Other visual disturbances: Secondary | ICD-10-CM | POA: Insufficient documentation

## 2017-08-25 DIAGNOSIS — Z79899 Other long term (current) drug therapy: Secondary | ICD-10-CM | POA: Diagnosis not present

## 2017-08-25 DIAGNOSIS — N189 Chronic kidney disease, unspecified: Secondary | ICD-10-CM | POA: Diagnosis not present

## 2017-08-25 DIAGNOSIS — N183 Chronic kidney disease, stage 3 unspecified: Secondary | ICD-10-CM

## 2017-08-25 DIAGNOSIS — Z7982 Long term (current) use of aspirin: Secondary | ICD-10-CM | POA: Insufficient documentation

## 2017-08-25 DIAGNOSIS — I5042 Chronic combined systolic (congestive) and diastolic (congestive) heart failure: Secondary | ICD-10-CM | POA: Diagnosis not present

## 2017-08-25 LAB — BASIC METABOLIC PANEL
Anion gap: 9 (ref 5–15)
BUN: 27 mg/dL — ABNORMAL HIGH (ref 6–20)
CO2: 26 mmol/L (ref 22–32)
Calcium: 8.9 mg/dL (ref 8.9–10.3)
Chloride: 93 mmol/L — ABNORMAL LOW (ref 98–111)
Creatinine, Ser: 2.05 mg/dL — ABNORMAL HIGH (ref 0.61–1.24)
GFR calc Af Amer: 47 mL/min — ABNORMAL LOW (ref >60)
GFR calc non Af Amer: 40 mL/min — ABNORMAL LOW (ref >60)
Glucose, Bld: 727 mg/dL (ref 70–99)
Potassium: 4.2 mmol/L (ref 3.5–5.1)
Sodium: 128 mmol/L — ABNORMAL LOW (ref 135–145)

## 2017-08-25 NOTE — Patient Instructions (Addendum)
[facility]   [phone]  [address] [place] [address]  Hours: Tuesday: 4:15 pm - 7:30 pm  Wednesday: 9 am - 1 pm  Thursday: 1 pm - 7:30 pm  Friday - Monday: CLOSED    Continue weighing daily and call for an overnight weight gain of > 2 pounds or a weekly weight gain of >5 pounds.

## 2017-08-25 NOTE — Telephone Encounter (Signed)
Received incoming call from lab with a critical glucose value of 727 from earlier today.  Patient saw [name] nurse practitioner today in the office for follow-up.  Called the patient's personal phone with no answer.  Left a message with instructions to check his blood sugar and take his insulin as prescribed.  Patient is on NovoLog and Levemir.   [name] NP-C [facility] Pager: [phone]

## 2017-08-25 NOTE — Progress Notes (Signed)
Patient ID: Joel [name], male    DOB: 10-12-1980, 37 y.o.   MRN: [id number]  HPI  Joel [name] is a 37 y/o male with a history of CKD, DM, HTN and chronic heart failure.   Echo report from 02/12/17 reviewed and showed an EF of 25-30% along with mild Joel. EF has declined from 2016.  Was in the ED 03/03/17 due to visual disturbance where he was evaluated and released. Admitted 02/12/17 due to HF and HTN. Cardiology consult was obtained and medications were adjusted and he was discharged after 2 days.  He presents today for a follow-up visit with a chief complaint of blurry vision. He says this has been chronic over several months. He has no associated symptoms. He denies any difficulty sleeping, abdominal distention, palpitations, pedal edema, chest pain, shortness of breath, cough, dizziness or fatigue. Says that he's not checking his glucose at home as he doesn't have a glucometer and can only check it when he uses a family member's meter. Says that his glucose was in the 300's a couple of days ago.  Hasn't filled out application for [facility].   Past Medical History:  Diagnosis Date  . Chronic diastolic CHF (congestive heart failure) (HCC)   . Chronic kidney disease   . Diabetes mellitus with complication (HCC)   . Hyperlipidemia   . Hypertension   . Hypertensive heart disease   . Noncompliance with medications   . Obesity   . Stroke (HCC)   . Systolic dysfunction    a. TTE 06/2014: EF 45-50%, normal wall motion, mild Joel, mild AI   Past Surgical History:  Procedure Laterality Date  . NO PAST SURGERIES     Family History  Problem Relation Age of Onset  . Hypertension Mother   . Hyperlipidemia Mother   . Diabetes Mother   . CAD Father        a. stenting in his early 49s  . Heart disease Father   . Stroke Father   . Diabetes Maternal Grandmother   . Diabetes Maternal Grandfather   . CAD Paternal Grandmother   . CAD Paternal Grandfather    Social History   Tobacco Use  .  Smoking status: Never Smoker  . Smokeless tobacco: Never Used  Substance Use Topics  . Alcohol use: Yes    Frequency: Never    Comment: Very infrequently; ~2-3 drinks/year    Allergies  Allergen Reactions  . Nitroglycerin Other (See Comments)    Causes severe headaches   Prior to Admission medications   Medication Sig Start Date End Date Taking? Authorizing Provider  amLODipine (NORVASC) 5 MG tablet Take 5 mg by mouth daily.   Yes [provider]  aspirin 81 MG chewable tablet Chew 1 tablet (81 mg total) by mouth daily. 10/13/14  Yes Milagros Loll, MD  aspirin-acetaminophen-caffeine (EXCEDRIN MIGRAINE) [phone] MG per tablet Take 0.5 tablets by mouth daily as needed for headache.    Yes [provider]  atorvastatin (LIPITOR) 80 MG tablet Take 1 tablet (80 mg total) by mouth daily at 6 PM. 03/11/17  Yes [name], FNP  carvedilol (COREG) 25 MG tablet Take 1 tablet (25 mg total) by mouth 2 (two) times daily with a meal. 03/11/17  Yes [name], FNP  furosemide (LASIX) 20 MG tablet Take 1 tablet (20 mg total) by mouth daily. 03/11/17  Yes Jeorge Reister, [name], FNP  insulin aspart (NOVOLOG) 100 UNIT/ML injection Inject 5 Units into the skin  3 (three) times daily with meals. 02/14/17  Yes [name], MD  insulin detemir (LEVEMIR) 100 UNIT/ML injection Inject 0.25 mLs (25 Units total) into the skin at bedtime. 02/14/17  Yes [name], MD  omeprazole (PRILOSEC) 20 MG capsule Take 20 mg by mouth daily.   Yes [provider]  sacubitril-valsartan (ENTRESTO) 97-103 MG Take 1 tablet by mouth 2 (two) times daily. 06/30/17  Yes [name], FNP   Review of Systems  Constitutional: Negative for appetite change and fatigue.  HENT: Negative for congestion, postnasal drip and sore throat.   Eyes: Positive for visual disturbance (blurry vision).  Respiratory: Negative for cough, chest tightness and shortness of breath.   Cardiovascular: Negative for chest pain,  palpitations and leg swelling.  Gastrointestinal: Negative for abdominal distention and abdominal pain.  Endocrine: Negative.   Genitourinary:       Nocturia  Musculoskeletal: Negative for back pain and neck pain.  Skin: Negative.   Allergic/Immunologic: Negative.   Neurological: Negative for dizziness and light-headedness.  Hematological: Negative for adenopathy. Does not bruise/bleed easily.  Psychiatric/Behavioral: Negative for dysphoric mood and sleep disturbance.   Vitals:   08/25/17 1532  BP: (!) 155/109  Pulse: (!) 101  Resp: 18  SpO2: 97%  Weight: (!) 303 lb 9 oz (137.7 kg)   Wt Readings from Last 3 Encounters:  08/25/17 (!) 303 lb 9 oz (137.7 kg)  08/07/17 298 lb 6 oz (135.3 kg)  06/30/17 (!) 307 lb 2 oz (139.3 kg)   Lab Results  Component Value Date   CREATININE 2.05 (H) 08/25/2017   CREATININE 1.81 (H) 04/07/2017   CREATININE 1.85 (H) 03/03/2017    Physical Exam  Constitutional: He is oriented to person, place, and time. He appears well-developed and well-nourished.  HENT:  Head: Normocephalic and atraumatic.  Neck: Normal range of motion. Neck supple. No JVD present.  Cardiovascular: Normal rate and regular rhythm.  Pulmonary/Chest: Effort normal. He has no wheezes. He has no rales.  Abdominal: Soft. He exhibits no distension. There is no tenderness.  Musculoskeletal: He exhibits no edema or tenderness.  Neurological: He is alert and oriented to person, place, and time.  Skin: Skin is warm and dry.  Psychiatric: He has a normal mood and affect. His behavior is normal. Thought content normal.  Nursing note and vitals reviewed.   Assessment & Plan:  1: Chronic heart failure with reduced ejection fraction- - NYHA class I - euvolemic today - weighing daily and says that his weight has fluctuated some. Reminded to call for an overnight weight gain of >2 pounds or a weekly weight gain of >5 pounds - weight up 5 pounds since he was last here ~3 weeks ago -  using Mrs. Dash for seasoning & has been reading food labels for sodium content. Encouraged to keep daily sodium intake to 2000mg  daily - 28 samples provided of entresto 49/51mg  so that he can take 2 tablets twice daily until he receives the 97/103mg  dose from [facility] - will check a BMP today - consider adding bidil - BNP on 02/12/17 was 309.0 - saw cardiologist Mariah Milling) 02/26/17  2: HTN- - BP elevated today but he says that he was at the gym right before coming to the office - BMP from 04/07/17 was reviewed and showed sodium 134, potassium 3.5 and GFR 54 - saw PCP Ether Griffins) 04/18/16; information given to him regarding [facility] - started going to the gym last week and goes 3 times/week for ~  1-2 hours at a time  3: Diabetes- - last glucose at home was in the 300's a couple of days ago; doesn't have glucometer and has no insurance; using family member's meter when he can - getting BMP today - says that he has nocturia but "not as bad as before"; explained that this could be due to hyperglycemia; glucose being checked with BMP today - A1c on 02/12/17 was 14.7% - has seen nephrologist in the past  Medication bottles were reviewed.  Return in 1 month or sooner for any questions/problems before then.

## 2017-08-26 ENCOUNTER — Ambulatory Visit: Payer: Self-pay | Admitting: Family

## 2017-08-26 ENCOUNTER — Telehealth: Payer: Self-pay | Admitting: Family

## 2017-08-26 NOTE — Telephone Encounter (Signed)
LM regarding insulin usage. [facility] PharmD recommended that patient take 17 units of levimir BID instead of taking 25 units at bedtime. Encouraged them to check his glucose 1-2 times daily with family's glucometer if possible until we get confirmation that we can get a glucometer.

## 2017-08-26 NOTE — Telephone Encounter (Signed)
LM on voicemail for patient's wife to call back regarding lab results from yesterday. Will need to adjust insulin based on glucose reading. Will also ask the charitable foundation if they can assist with getting patient a glucometer. Patient will also need to focus on getting an appointment at [facility].

## 2017-09-15 ENCOUNTER — Telehealth: Payer: Self-pay | Admitting: Pharmacy Technician

## 2017-09-15 NOTE — Telephone Encounter (Signed)
Patient is seeing Dr. Mariah Milling.  Dr. Mariah Milling originally was signing paperwork for [facility] to obtain insulin.  Dr. Mariah Milling no longer willing to sign PAP applications.  Has referred patient to [facility].  I am reaching out to [facility] to contact patient.  Sherilyn Dacosta Care Manager [facility]

## 2017-09-17 ENCOUNTER — Telehealth: Payer: Self-pay | Admitting: Internal Medicine

## 2017-09-17 NOTE — Telephone Encounter (Signed)
Called patient's home phone number and spoke with pt's wife. Wife stated that pt is at work currently. Instructed wife to let pt know to call [facility] back to schedule an appointment. Wife expressed understanding of this message

## 2017-09-22 ENCOUNTER — Encounter: Payer: Self-pay | Admitting: Family

## 2017-09-22 ENCOUNTER — Ambulatory Visit: Payer: Medicaid Other | Attending: Family | Admitting: Family

## 2017-09-22 VITALS — BP 149/109 | HR 86 | Resp 18 | Ht 67.0 in | Wt 304.5 lb

## 2017-09-22 DIAGNOSIS — Z794 Long term (current) use of insulin: Secondary | ICD-10-CM | POA: Insufficient documentation

## 2017-09-22 DIAGNOSIS — E669 Obesity, unspecified: Secondary | ICD-10-CM | POA: Diagnosis not present

## 2017-09-22 DIAGNOSIS — I5042 Chronic combined systolic (congestive) and diastolic (congestive) heart failure: Secondary | ICD-10-CM | POA: Diagnosis not present

## 2017-09-22 DIAGNOSIS — Z8673 Personal history of transient ischemic attack (TIA), and cerebral infarction without residual deficits: Secondary | ICD-10-CM | POA: Diagnosis not present

## 2017-09-22 DIAGNOSIS — N183 Chronic kidney disease, stage 3 unspecified: Secondary | ICD-10-CM

## 2017-09-22 DIAGNOSIS — Z79899 Other long term (current) drug therapy: Secondary | ICD-10-CM | POA: Insufficient documentation

## 2017-09-22 DIAGNOSIS — Z7982 Long term (current) use of aspirin: Secondary | ICD-10-CM | POA: Diagnosis not present

## 2017-09-22 DIAGNOSIS — N189 Chronic kidney disease, unspecified: Secondary | ICD-10-CM | POA: Insufficient documentation

## 2017-09-22 DIAGNOSIS — E1122 Type 2 diabetes mellitus with diabetic chronic kidney disease: Secondary | ICD-10-CM | POA: Diagnosis not present

## 2017-09-22 DIAGNOSIS — E1022 Type 1 diabetes mellitus with diabetic chronic kidney disease: Secondary | ICD-10-CM

## 2017-09-22 DIAGNOSIS — Z6841 Body Mass Index (BMI) 40.0 and over, adult: Secondary | ICD-10-CM | POA: Insufficient documentation

## 2017-09-22 DIAGNOSIS — I10 Essential (primary) hypertension: Secondary | ICD-10-CM

## 2017-09-22 DIAGNOSIS — I130 Hypertensive heart and chronic kidney disease with heart failure and stage 1 through stage 4 chronic kidney disease, or unspecified chronic kidney disease: Secondary | ICD-10-CM | POA: Diagnosis present

## 2017-09-22 DIAGNOSIS — E785 Hyperlipidemia, unspecified: Secondary | ICD-10-CM | POA: Diagnosis not present

## 2017-09-22 DIAGNOSIS — I5022 Chronic systolic (congestive) heart failure: Secondary | ICD-10-CM

## 2017-09-22 NOTE — Progress Notes (Signed)
Patient ID: Joel [name], male    DOB: 05/03/1980, 37 y.o.   MRN: [id number]  HPI  Mr Leitz is a 37 y/o male with a history of CKD, DM, HTN and chronic heart failure.   Echo report from 02/12/17 reviewed and showed an EF of 25-30% along with mild MR. EF has declined from 2016.  Was in the ED 03/03/17 due to visual disturbance where he was evaluated and released. Admitted 02/12/17 due to HF and HTN. Cardiology consult was obtained and medications were adjusted and he was discharged after 2 days.  He presents today for a follow-up visit with a chief complaint of continued blurry vision. He says this has been present for a long time and doesn't seem to be any worse. He doesn't have any associated symptoms. He denies any fatigue, cough, shortness of breath, chest pain, pedal edema, palpitations, dizziness, difficulty sleeping or weight gain.  Glucose levels continue to be high at home. Says that he's waiting on [facility] to call him back to schedule an appointment.   Past Medical History:  Diagnosis Date  . Chronic diastolic CHF (congestive heart failure) (HCC)   . Chronic kidney disease   . Diabetes mellitus with complication (HCC)   . Hyperlipidemia   . Hypertension   . Hypertensive heart disease   . Noncompliance with medications   . Obesity   . Stroke (HCC)   . Systolic dysfunction    a. TTE 06/2014: EF 45-50%, normal wall motion, mild MR, mild AI   Past Surgical History:  Procedure Laterality Date  . NO PAST SURGERIES     Family History  Problem Relation Age of Onset  . Hypertension Mother   . Hyperlipidemia Mother   . Diabetes Mother   . CAD Father        a. stenting in his early 25s  . Heart disease Father   . Stroke Father   . Diabetes Maternal Grandmother   . Diabetes Maternal Grandfather   . CAD Paternal Grandmother   . CAD Paternal Grandfather    Social History   Tobacco Use  . Smoking status: Never Smoker  . Smokeless tobacco: Never Used  Substance Use  Topics  . Alcohol use: Yes    Frequency: Never    Comment: Very infrequently; ~2-3 drinks/year    Allergies  Allergen Reactions  . Nitroglycerin Other (See Comments)    Causes severe headaches   Prior to Admission medications   Medication Sig Start Date End Date Taking? Authorizing Provider  amLODipine (NORVASC) 5 MG tablet Take 5 mg by mouth daily.   Yes [provider]  aspirin 81 MG chewable tablet Chew 1 tablet (81 mg total) by mouth daily. 10/13/14  Yes Milagros Loll, MD  aspirin-acetaminophen-caffeine (EXCEDRIN MIGRAINE) [phone] MG per tablet Take 0.5 tablets by mouth daily as needed for headache.    Yes [provider]  atorvastatin (LIPITOR) 80 MG tablet Take 1 tablet (80 mg total) by mouth daily at 6 PM. 03/11/17  Yes [name], FNP  carvedilol (COREG) 25 MG tablet Take 1 tablet (25 mg total) by mouth 2 (two) times daily with a meal. 03/11/17  Yes [name], FNP  furosemide (LASIX) 20 MG tablet Take 1 tablet (20 mg total) by mouth daily. 03/11/17  Yes Hackney, [name], FNP  insulin aspart (NOVOLOG) 100 UNIT/ML injection Inject 5 Units into the skin 3 (three) times daily with meals. 02/14/17  Yes [name], MD  insulin  detemir (LEVEMIR) 100 UNIT/ML injection Inject 0.25 mLs (25 Units total) into the skin at bedtime. Patient taking differently: Inject 17 Units into the skin 2 (two) times daily.  02/14/17  Yes [name], MD  omeprazole (PRILOSEC) 20 MG capsule Take 20 mg by mouth daily.   Yes [provider]  sacubitril-valsartan (ENTRESTO) 97-103 MG Take 1 tablet by mouth 2 (two) times daily. 06/30/17  Yes [name], FNP    Review of Systems  Constitutional: Negative for appetite change and fatigue.  HENT: Negative for congestion, postnasal drip and sore throat.   Eyes: Positive for visual disturbance (blurry vision).  Respiratory: Negative for cough, chest tightness and shortness of breath.   Cardiovascular: Negative for chest  pain, palpitations and leg swelling.  Gastrointestinal: Negative for abdominal distention and abdominal pain.  Endocrine: Negative.   Musculoskeletal: Negative for back pain and neck pain.  Skin: Negative.   Allergic/Immunologic: Negative.   Neurological: Negative for dizziness and light-headedness.  Hematological: Negative for adenopathy. Does not bruise/bleed easily.  Psychiatric/Behavioral: Negative for dysphoric mood and sleep disturbance.   Vitals:   09/22/17 1559  BP: (!) 149/109  Pulse: 86  Resp: 18  SpO2: 98%  Weight: (!) 304 lb 8 oz (138.1 kg)  Height: 5\' 7"  (1.702 m)   Wt Readings from Last 3 Encounters:  09/22/17 (!) 304 lb 8 oz (138.1 kg)  08/25/17 (!) 303 lb 9 oz (137.7 kg)  08/07/17 298 lb 6 oz (135.3 kg)   Lab Results  Component Value Date   CREATININE 2.05 (H) 08/25/2017   CREATININE 1.81 (H) 04/07/2017   CREATININE 1.85 (H) 03/03/2017    Physical Exam  Constitutional: He is oriented to person, place, and time. He appears well-developed and well-nourished.  HENT:  Head: Normocephalic and atraumatic.  Neck: Normal range of motion. Neck supple. No JVD present.  Cardiovascular: Normal rate and regular rhythm.  Pulmonary/Chest: Effort normal. He has no wheezes. He has no rales.  Abdominal: Soft. He exhibits no distension. There is no tenderness.  Musculoskeletal: He exhibits no edema or tenderness.  Neurological: He is alert and oriented to person, place, and time.  Skin: Skin is warm and dry.  Psychiatric: He has a normal mood and affect. His behavior is normal. Thought content normal.  Nursing note and vitals reviewed.   Assessment & Plan:  1: Chronic heart failure with reduced ejection fraction- - NYHA class I - euvolemic today - weighing daily and says that his weight has fluctuated some. Reminded to call for an overnight weight gain of >2 pounds or a weekly weight gain of >5 pounds - weight stable from last time he was here 1 month ago - using  Mrs. Dash for seasoning & has been reading food labels for sodium content. Encouraged to keep daily sodium intake to 2000mg  daily - will add bidil 1 tablet TID; 24 tablets given with Lot [id number] A/ EXP 05/21 - had severe headache with NTG and explained that bidil contained isosorbide which could potentially cause a headache. If he gets a severe headache like with NTG, he's to stop taking the bidil and call me back - if he can tolerate bidil, he will need to pick up more samples and I can then send RX to [facility] management clinic - consider adding spironolactone in the future - BNP on 02/12/17 was 309.0 - saw cardiologist Mariah Milling) 02/26/17  2: HTN- - BP elevated today; adding bidil per above - BMP from 08/25/17 was reviewed and showed sodium  128, potassium 4.2 and GFR 47 - saw PCP Ether Griffins) 04/18/16;awaiting on [facility] to call to schedule an appointment - goes to the gym 3 times/week for ~ 1-2 hours at a time  3: Diabetes- - now has a glucometer - glucose today was 387 - based on PharmD recommendations, will increase his levemir to 20 units twice daily - A1c on 02/12/17 was 14.7% - has seen nephrologist in the past  Medication bottles were reviewed.  Return in 6 weeks or sooner for any questions/problems before then.

## 2017-09-22 NOTE — Patient Instructions (Signed)
Continue weighing daily and call for an overnight weight gain of > 2 pounds or a weekly weight gain of >5 pounds. 

## 2017-09-23 ENCOUNTER — Encounter: Payer: Self-pay | Admitting: Family

## 2017-10-01 ENCOUNTER — Other Ambulatory Visit: Payer: Self-pay | Admitting: Family

## 2017-10-01 MED ORDER — INSULIN DETEMIR 100 UNIT/ML ~~LOC~~ SOLN: 20.0000 [IU] | Freq: Two times a day (BID) | SUBCUTANEOUS | 11 refills | Status: DC

## 2017-10-02 ENCOUNTER — Telehealth: Payer: Self-pay | Admitting: Pharmacist

## 2017-10-02 NOTE — Telephone Encounter (Signed)
10/02/2017 10:42:25 AM - Levemir Dose Increase  10/02/17 Received a pharmacy printout for Dose Change=Levemir vials Inject 20 units under the skin two times a day # 5, printed refill request and will take to [facility] to sign.Joel [name]

## 2017-10-14 ENCOUNTER — Telehealth: Payer: Self-pay | Admitting: Pharmacist

## 2017-10-14 NOTE — Telephone Encounter (Signed)
10/14/2017 2:38:57 PM - Levemir Dose Increase  10/14/17 Faxed Novo Nordisk refill request for Levemir Vial Dose Increase Inject 20 units under the skin two times a day # 5 (Max daily dose 40 units).[name]

## 2017-11-05 ENCOUNTER — Ambulatory Visit: Payer: Self-pay | Admitting: Family

## 2017-11-11 NOTE — Progress Notes (Signed)
Patient ID: Joel [name], male    DOB: 10-19-1980, 37 y.o.   MRN: [id number]  HPI  Joel [name] is a 37 y/o male with a history of CKD, DM, HTN and chronic heart failure.   Echo report from 02/12/17 reviewed and showed an EF of 25-30% along with mild Joel. EF has declined from 2016.  Has not been admitted or been in the ED in the last 6 months.  He presents today for a follow-up visit with a chief complaint of light-headedness. He describes this as chronic in nature having been present for several years. This tends to occur with sudden position changes. He has associated blurry vision (chronic) and slight weight gain along with this. He denies any difficulty sleeping, abdominal distention, palpitations, pedal edema, chest pain, shortness of breath, cough or fatigue. Has still not received a phone call from [facility] about getting an appointment scheduled. Glucose levels at home continue to run high and range from 325-365. Was unable to tolerate bidil as it gave him a headache.   Past Medical History:  Diagnosis Date  . Chronic diastolic CHF (congestive heart failure) (HCC)   . Chronic kidney disease   . Diabetes mellitus with complication (HCC)   . Hyperlipidemia   . Hypertension   . Hypertensive heart disease   . Noncompliance with medications   . Obesity   . Stroke (HCC)   . Systolic dysfunction    a. TTE 06/2014: EF 45-50%, normal wall motion, mild Joel, mild AI   Past Surgical History:  Procedure Laterality Date  . NO PAST SURGERIES     Family History  Problem Relation Age of Onset  . Hypertension Mother   . Hyperlipidemia Mother   . Diabetes Mother   . CAD Father        a. stenting in his early 71s  . Heart disease Father   . Stroke Father   . Diabetes Maternal Grandmother   . Diabetes Maternal Grandfather   . CAD Paternal Grandmother   . CAD Paternal Grandfather    Social History   Tobacco Use  . Smoking status: Never Smoker  . Smokeless tobacco: Never Used   Substance Use Topics  . Alcohol use: Yes    Frequency: Never    Comment: Very infrequently; ~2-3 drinks/year    Allergies  Allergen Reactions  . Bidil [Isosorb Dinitrate-Hydralazine] Other (See Comments)    Severe headache  . Nitroglycerin Other (See Comments)    Causes severe headaches   Prior to Admission medications   Medication Sig Start Date End Date Taking? Authorizing Provider  amLODipine (NORVASC) 5 MG tablet Take 5 mg by mouth daily.   Yes [provider]  aspirin 81 MG chewable tablet Chew 1 tablet (81 mg total) by mouth daily. 10/13/14  Yes Milagros Loll, MD  atorvastatin (LIPITOR) 80 MG tablet Take 1 tablet (80 mg total) by mouth daily at 6 PM. 03/11/17  Yes [name], FNP  carvedilol (COREG) 25 MG tablet Take 1 tablet (25 mg total) by mouth 2 (two) times daily with a meal. 03/11/17  Yes [name], FNP  furosemide (LASIX) 20 MG tablet Take 1 tablet (20 mg total) by mouth daily. 03/11/17  Yes [name], [name], FNP  insulin aspart (NOVOLOG) 100 UNIT/ML injection Inject 5 Units into the skin 3 (three) times daily with meals. 02/14/17  Yes [name], MD  insulin detemir (LEVEMIR) 100 UNIT/ML injection Inject 0.2 mLs (20 Units total) into the  skin 2 (two) times daily. Patient taking differently: Inject 25 Units into the skin 2 (two) times daily.  10/01/17  Yes [name], [name], FNP  omeprazole (PRILOSEC) 20 MG capsule Take 20 mg by mouth daily.   Yes [provider]  sacubitril-valsartan (ENTRESTO) 97-103 MG Take 1 tablet by mouth 2 (two) times daily. 06/30/17  Yes [name], FNP  aspirin-acetaminophen-caffeine (EXCEDRIN MIGRAINE) [id number] MG per tablet Take 0.5 tablets by mouth daily as needed for headache.     [provider]    Review of Systems  Constitutional: Negative for appetite change and fatigue.  HENT: Negative for congestion, postnasal drip and sore throat.   Eyes: Positive for visual disturbance (blurry vision).   Respiratory: Negative for cough, chest tightness and shortness of breath.   Cardiovascular: Negative for chest pain, palpitations and leg swelling.  Gastrointestinal: Negative for abdominal distention and abdominal pain.  Endocrine: Negative.   Musculoskeletal: Negative for back pain and neck pain.  Skin: Negative.   Allergic/Immunologic: Negative.   Neurological: Positive for light-headedness. Negative for dizziness.  Hematological: Negative for adenopathy. Does not bruise/bleed easily.  Psychiatric/Behavioral: Negative for dysphoric mood and sleep disturbance.   Vitals:   11/12/17 1542  BP: (!) 135/108  Pulse: (!) 109  Resp: 18  SpO2: 96%  Weight: (!) 310 lb 6 oz (140.8 kg)  Height: 5\' 7"  (1.702 m)   Wt Readings from Last 3 Encounters:  11/12/17 (!) 310 lb 6 oz (140.8 kg)  09/22/17 (!) 304 lb 8 oz (138.1 kg)  08/25/17 (!) 303 lb 9 oz (137.7 kg)   Lab Results  Component Value Date   CREATININE 2.05 (H) 08/25/2017   CREATININE 1.81 (H) 04/07/2017   CREATININE 1.85 (H) 03/03/2017   Physical Exam  Constitutional: He is oriented to person, place, and time. He appears well-developed and well-nourished.  HENT:  Head: Normocephalic and atraumatic.  Neck: Normal range of motion. Neck supple. No JVD present.  Cardiovascular: Normal rate and regular rhythm.  Pulmonary/Chest: Effort normal. He has no wheezes. He has no rales.  Abdominal: Soft. He exhibits no distension. There is no tenderness.  Musculoskeletal: He exhibits no edema or tenderness.  Neurological: He is alert and oriented to person, place, and time.  Skin: Skin is warm and dry.  Psychiatric: He has a normal mood and affect. His behavior is normal. Thought content normal.  Nursing note and vitals reviewed.   Assessment & Plan:  1: Chronic heart failure with reduced ejection fraction- - NYHA class I - euvolemic today - weighing daily and says that his weight has fluctuated some. Reminded to call for an overnight  weight gain of >2 pounds or a weekly weight gain of >5 pounds - weight up 6 pounds since he was last here 6 weeks ago - using Mrs. Dash for seasoning & has been reading food labels for sodium content. Encouraged to keep daily sodium intake to 2000mg  daily - unable to tolerate bidil as it gave him a headache - BNP on 02/12/17 was 309.0 - saw cardiologist Joel [name]) 02/26/17  2: HTN- - BP elevated today - will add spironolactone 25mg  daily for his resistant HTN - check BMP next visit - BMP from 08/25/17 was reviewed and showed sodium 128, potassium 4.2 and GFR 47 - saw PCP Joel [name]) 04/18/16;awaiting on [facility] to call to schedule an appointment; patient may just show up to get an appointment scheduled  - goes to the gym 3 times/week for ~ 1-2 hours  at a time  3: Diabetes- - glucose today was 356 - will get BMP today - will discuss with PharmD recommendation about insulin usage  - A1c on 02/12/17 was 14.7% - has seen nephrologist in the past  Medication bottles were reviewed.  Return in 1 month or sooner for any questions/problems before then.

## 2017-11-12 ENCOUNTER — Ambulatory Visit: Payer: Medicaid Other | Attending: Family | Admitting: Family

## 2017-11-12 ENCOUNTER — Encounter: Payer: Self-pay | Admitting: Family

## 2017-11-12 VITALS — BP 135/108 | HR 109 | Resp 18 | Ht 67.0 in | Wt 310.4 lb

## 2017-11-12 DIAGNOSIS — E785 Hyperlipidemia, unspecified: Secondary | ICD-10-CM | POA: Diagnosis not present

## 2017-11-12 DIAGNOSIS — N183 Chronic kidney disease, stage 3 unspecified: Secondary | ICD-10-CM

## 2017-11-12 DIAGNOSIS — Z6841 Body Mass Index (BMI) 40.0 and over, adult: Secondary | ICD-10-CM | POA: Insufficient documentation

## 2017-11-12 DIAGNOSIS — I130 Hypertensive heart and chronic kidney disease with heart failure and stage 1 through stage 4 chronic kidney disease, or unspecified chronic kidney disease: Secondary | ICD-10-CM | POA: Diagnosis not present

## 2017-11-12 DIAGNOSIS — E1122 Type 2 diabetes mellitus with diabetic chronic kidney disease: Secondary | ICD-10-CM | POA: Insufficient documentation

## 2017-11-12 DIAGNOSIS — Z888 Allergy status to other drugs, medicaments and biological substances status: Secondary | ICD-10-CM | POA: Diagnosis not present

## 2017-11-12 DIAGNOSIS — Z8673 Personal history of transient ischemic attack (TIA), and cerebral infarction without residual deficits: Secondary | ICD-10-CM | POA: Insufficient documentation

## 2017-11-12 DIAGNOSIS — E1022 Type 1 diabetes mellitus with diabetic chronic kidney disease: Secondary | ICD-10-CM

## 2017-11-12 DIAGNOSIS — I10 Essential (primary) hypertension: Secondary | ICD-10-CM

## 2017-11-12 DIAGNOSIS — Z833 Family history of diabetes mellitus: Secondary | ICD-10-CM | POA: Diagnosis not present

## 2017-11-12 DIAGNOSIS — N189 Chronic kidney disease, unspecified: Secondary | ICD-10-CM | POA: Diagnosis not present

## 2017-11-12 DIAGNOSIS — I5022 Chronic systolic (congestive) heart failure: Secondary | ICD-10-CM | POA: Diagnosis present

## 2017-11-12 DIAGNOSIS — Z79899 Other long term (current) drug therapy: Secondary | ICD-10-CM | POA: Insufficient documentation

## 2017-11-12 DIAGNOSIS — E669 Obesity, unspecified: Secondary | ICD-10-CM | POA: Diagnosis not present

## 2017-11-12 DIAGNOSIS — Z823 Family history of stroke: Secondary | ICD-10-CM | POA: Diagnosis not present

## 2017-11-12 DIAGNOSIS — Z794 Long term (current) use of insulin: Secondary | ICD-10-CM | POA: Diagnosis not present

## 2017-11-12 DIAGNOSIS — Z7982 Long term (current) use of aspirin: Secondary | ICD-10-CM | POA: Diagnosis not present

## 2017-11-12 DIAGNOSIS — Z8249 Family history of ischemic heart disease and other diseases of the circulatory system: Secondary | ICD-10-CM | POA: Insufficient documentation

## 2017-11-12 LAB — BASIC METABOLIC PANEL
Anion gap: 9 (ref 5–15)
BUN: 25 mg/dL — ABNORMAL HIGH (ref 6–20)
CO2: 27 mmol/L (ref 22–32)
Calcium: 9.5 mg/dL (ref 8.9–10.3)
Chloride: 100 mmol/L (ref 98–111)
Creatinine, Ser: 1.71 mg/dL — ABNORMAL HIGH (ref 0.61–1.24)
GFR calc Af Amer: 58 mL/min — ABNORMAL LOW (ref >60)
GFR calc non Af Amer: 50 mL/min — ABNORMAL LOW (ref >60)
Glucose, Bld: 376 mg/dL — ABNORMAL HIGH (ref 70–99)
Potassium: 3.6 mmol/L (ref 3.5–5.1)
Sodium: 136 mmol/L (ref 135–145)

## 2017-11-12 MED ORDER — SPIRONOLACTONE 25 MG PO TABS: 25.0000 mg | ORAL_TABLET | Freq: Every day | ORAL | 3 refills | Status: DC

## 2017-11-12 NOTE — Patient Instructions (Signed)
Continue weighing daily and call for an overnight weight gain of > 2 pounds or a weekly weight gain of >5 pounds. 

## 2017-11-13 ENCOUNTER — Telehealth: Payer: Self-pay | Admitting: Family

## 2017-11-13 ENCOUNTER — Encounter: Payer: Self-pay | Admitting: Family

## 2017-11-13 NOTE — Telephone Encounter (Signed)
Pharm D recommendation for levemir usage is to increase his levemir dose by 4 units every three days if his fasting glucose is >180.   Could also (in the future) adjust his novolog by 1-2 units before meals depending on glucose levels but will not adjust that at this time.   Patient's wife, Shanda Bumps, responded back via email understanding of levemir instructions.

## 2017-11-25 ENCOUNTER — Other Ambulatory Visit: Payer: Self-pay | Admitting: Family

## 2017-11-25 MED ORDER — INSULIN DETEMIR 100 UNIT/ML ~~LOC~~ SOLN: 45.0000 [IU] | Freq: Two times a day (BID) | SUBCUTANEOUS | 3 refills | Status: DC

## 2017-11-25 MED ORDER — ATORVASTATIN CALCIUM 80 MG PO TABS: 80.0000 mg | ORAL_TABLET | Freq: Every day | ORAL | 3 refills | Status: DC

## 2017-12-01 ENCOUNTER — Telehealth: Payer: Self-pay | Admitting: Pharmacist

## 2017-12-01 NOTE — Telephone Encounter (Signed)
12/01/2017 3:24:30 PM - Levemir Dose Change  12/01/17 Received a pharmacy printout for Dose Increase-Levemir Vial Inject 45 units under the skin 2 times a day # 11 (max daily dose 90 units). I will take a dose change form to [facility] to sign. On 10/14/17 I faxed a refill request for dose change 20 units twice a day.[name]

## 2017-12-12 NOTE — Progress Notes (Signed)
Bmp \  Patient ID: Joel [name], male    DOB: 01/15/1981, 37 y.o.   MRN: [id number]  HPI  Joel [name] is [name] 37 y/o male with [name] history of CKD, DM, HTN and chronic heart failure.   Echo report from 02/12/17 reviewed and showed an EF of 25-30% along with mild Joel. EF has declined from 2016.  Has not been admitted or been in the ED in the last 6 months.  He presents today for [name] follow-up visit with [name] chief complaint of intermittent headaches. He says that he's had migraines in the past but only experiences headaches on occasion now. He has [name] gradual weight gain along with this. He denies any difficulty sleeping, abdominal distention, palpitations, pedal edema, chest pain, shortness of breath, cough, fatigue or dizziness. Is now taking 45 units of levemir twice daily with fasting glucose levels between 190-215 as opposed to the  500's.   Past Medical History:  Diagnosis Date  . Chronic diastolic CHF (congestive heart failure) (HCC)   . Chronic kidney disease   . Diabetes mellitus with complication (HCC)   . Hyperlipidemia   . Hypertension   . Hypertensive heart disease   . Noncompliance with medications   . Obesity   . Stroke (HCC)   . Systolic dysfunction    [name]. TTE 06/2014: EF 45-50%, normal wall motion, mild Joel, mild AI   Past Surgical History:  Procedure Laterality Date  . NO PAST SURGERIES     Family History  Problem Relation Age of Onset  . Hypertension Mother   . Hyperlipidemia Mother   . Diabetes Mother   . CAD Father        [name]. stenting in his early 66s  . Heart disease Father   . Stroke Father   . Diabetes Maternal Grandmother   . Diabetes Maternal Grandfather   . CAD Paternal Grandmother   . CAD Paternal Grandfather    Social History   Tobacco Use  . Smoking status: Never Smoker  . Smokeless tobacco: Never Used  Substance Use Topics  . Alcohol use: Yes    Frequency: Never    Comment: Very infrequently; ~2-3 drinks/year    Allergies  Allergen Reactions  . Bidil  [Isosorb Dinitrate-Hydralazine] Other (See Comments)    Severe headache  . Nitroglycerin Other (See Comments)    Causes severe headaches   Prior to Admission medications   Medication Sig Start Date End Date Taking? Authorizing Provider  amLODipine (NORVASC) 5 MG tablet Take 5 mg by mouth daily.   Yes [provider]  aspirin 81 MG chewable tablet Chew 1 tablet (81 mg total) by mouth daily. 10/13/14  Yes Joel Loll, MD  atorvastatin (LIPITOR) 80 MG tablet Take 1 tablet (80 mg total) by mouth daily at 6 PM. 11/25/17  Yes Joel [name] A, FNP  carvedilol (COREG) 25 MG tablet Take 1 tablet (25 mg total) by mouth 2 (two) times daily with [name] meal. 03/11/17  Yes Joel [name] A, FNP  furosemide (LASIX) 20 MG tablet Take 1 tablet (20 mg total) by mouth daily. 03/11/17  Yes Joel [name], Joel Fermo A, FNP  insulin aspart (NOVOLOG) 100 UNIT/ML injection Inject 5 Units into the skin 3 (three) times daily with meals. 02/14/17  Yes Joel Bilberry, MD  insulin detemir (LEVEMIR) 100 UNIT/ML injection Inject 0.45 mLs (45 Units total) into the skin 2 (two) times daily. 11/25/17  Yes Joel [name], Joel Fermo A, FNP  omeprazole (PRILOSEC) 20 MG capsule Take 20 mg by mouth  daily.   Yes [provider]  sacubitril-valsartan (ENTRESTO) 97-103 MG Take 1 tablet by mouth 2 (two) times daily. 06/30/17  Yes Joel [name] A, FNP  spironolactone (ALDACTONE) 25 MG tablet Take 1 tablet (25 mg total) by mouth daily. 11/12/17 02/10/18 Yes Joel Freeze, FNP    Review of Systems  Constitutional: Negative for appetite change and fatigue.  HENT: Negative for congestion, postnasal drip and sore throat.   Eyes: Positive for visual disturbance (blurry vision).  Respiratory: Negative for cough, chest tightness and shortness of breath.   Cardiovascular: Negative for chest pain, palpitations and leg swelling.  Gastrointestinal: Negative for abdominal distention and abdominal pain.  Endocrine: Negative.   Musculoskeletal: Negative for  back pain and neck pain.  Skin: Negative.   Allergic/Immunologic: Negative.   Neurological: Positive for headaches (yesterday). Negative for dizziness and light-headedness.  Hematological: Negative for adenopathy. Does not bruise/bleed easily.  Psychiatric/Behavioral: Negative for dysphoric mood and sleep disturbance.   Vitals:   12/15/17 1536  BP: 133/86  Pulse: (!) 103  Resp: 18  SpO2: 98%  Weight: (!) 316 lb 8 oz (143.6 kg)  Height: 5\' 7"  (1.702 m)   Wt Readings from Last 3 Encounters:  12/15/17 (!) 316 lb 8 oz (143.6 kg)  11/12/17 (!) 310 lb 6 oz (140.8 kg)  09/22/17 (!) 304 lb 8 oz (138.1 kg)   Lab Results  Component Value Date   CREATININE 1.71 (H) 11/12/2017   CREATININE 2.05 (H) 08/25/2017   CREATININE 1.81 (H) 04/07/2017    Physical Exam  Constitutional: He is oriented to person, place, and time. He appears well-developed and well-nourished.  HENT:  Head: Normocephalic and atraumatic.  Neck: Normal range of motion. Neck supple. No JVD present.  Cardiovascular: Normal rate and regular rhythm.  Pulmonary/Chest: Effort normal. He has no wheezes. He has no rales.  Abdominal: Soft. He exhibits no distension. There is no tenderness.  Musculoskeletal: He exhibits no edema or tenderness.  Neurological: He is alert and oriented to person, place, and time.  Skin: Skin is warm and dry.  Psychiatric: He has [name] normal mood and affect. His behavior is normal. Thought content normal.  Nursing note and vitals reviewed.   Assessment & Plan:  1: Chronic heart failure with reduced ejection fraction- - NYHA class I - euvolemic today - weighing daily and says that his weight has gradually risen. Reminded to call for an overnight weight gain of >2 pounds or [name] weekly weight gain of >5 pounds - weight up 6 pounds since last here 1 month ago - using Mrs. Dash for seasoning & has been reading food labels for sodium content. Encouraged to keep daily sodium intake to 2000mg  daily -  unable to tolerate bidil as it gave him [name] headache - BNP on 02/12/17 was 309.0 - saw cardiologist Joel [name]) 02/26/17 - may need to consider corlanor if he remains tachycardic  2: HTN- - BP looks good today - check BMP today since spironolactone was added at last visit - BMP from 11/12/17 was reviewed and showed sodium 136, potassium 3.6, creatinine 1.71 and GFR 58 - says that he's till awaiting [name] call from [facility] - goes to the gym 3 times/week for ~ 1-2 hours at [name] time  3: Diabetes- - glucose at home has been running 190-215 - will get A1c today - A1c on 02/12/17 was 14.7% - has seen nephrologist in the past  Medication bottles were reviewed.  Return in 1 month or sooner  for any questions/problems before then.

## 2017-12-15 ENCOUNTER — Ambulatory Visit: Payer: Medicaid Other | Attending: Family | Admitting: Family

## 2017-12-15 ENCOUNTER — Encounter: Payer: Self-pay | Admitting: Family

## 2017-12-15 ENCOUNTER — Other Ambulatory Visit: Payer: Self-pay | Admitting: Family

## 2017-12-15 VITALS — BP 133/86 | HR 103 | Resp 18 | Ht 67.0 in | Wt 316.5 lb

## 2017-12-15 DIAGNOSIS — Z833 Family history of diabetes mellitus: Secondary | ICD-10-CM | POA: Insufficient documentation

## 2017-12-15 DIAGNOSIS — E669 Obesity, unspecified: Secondary | ICD-10-CM | POA: Insufficient documentation

## 2017-12-15 DIAGNOSIS — E785 Hyperlipidemia, unspecified: Secondary | ICD-10-CM | POA: Insufficient documentation

## 2017-12-15 DIAGNOSIS — Z8249 Family history of ischemic heart disease and other diseases of the circulatory system: Secondary | ICD-10-CM | POA: Diagnosis not present

## 2017-12-15 DIAGNOSIS — E1022 Type 1 diabetes mellitus with diabetic chronic kidney disease: Secondary | ICD-10-CM

## 2017-12-15 DIAGNOSIS — E1122 Type 2 diabetes mellitus with diabetic chronic kidney disease: Secondary | ICD-10-CM | POA: Diagnosis not present

## 2017-12-15 DIAGNOSIS — Z7982 Long term (current) use of aspirin: Secondary | ICD-10-CM | POA: Insufficient documentation

## 2017-12-15 DIAGNOSIS — Z79899 Other long term (current) drug therapy: Secondary | ICD-10-CM | POA: Insufficient documentation

## 2017-12-15 DIAGNOSIS — N189 Chronic kidney disease, unspecified: Secondary | ICD-10-CM | POA: Insufficient documentation

## 2017-12-15 DIAGNOSIS — N183 Chronic kidney disease, stage 3 unspecified: Secondary | ICD-10-CM

## 2017-12-15 DIAGNOSIS — Z8673 Personal history of transient ischemic attack (TIA), and cerebral infarction without residual deficits: Secondary | ICD-10-CM | POA: Insufficient documentation

## 2017-12-15 DIAGNOSIS — I130 Hypertensive heart and chronic kidney disease with heart failure and stage 1 through stage 4 chronic kidney disease, or unspecified chronic kidney disease: Secondary | ICD-10-CM | POA: Diagnosis not present

## 2017-12-15 DIAGNOSIS — Z9114 Patient's other noncompliance with medication regimen: Secondary | ICD-10-CM | POA: Insufficient documentation

## 2017-12-15 DIAGNOSIS — I5042 Chronic combined systolic (congestive) and diastolic (congestive) heart failure: Secondary | ICD-10-CM | POA: Diagnosis not present

## 2017-12-15 DIAGNOSIS — I10 Essential (primary) hypertension: Secondary | ICD-10-CM

## 2017-12-15 DIAGNOSIS — Z794 Long term (current) use of insulin: Secondary | ICD-10-CM | POA: Insufficient documentation

## 2017-12-15 DIAGNOSIS — R51 Headache: Secondary | ICD-10-CM | POA: Insufficient documentation

## 2017-12-15 DIAGNOSIS — I5022 Chronic systolic (congestive) heart failure: Secondary | ICD-10-CM

## 2017-12-15 LAB — BASIC METABOLIC PANEL
Anion gap: 8 (ref 5–15)
BUN: 32 mg/dL — ABNORMAL HIGH (ref 6–20)
CO2: 27 mmol/L (ref 22–32)
Calcium: 9.5 mg/dL (ref 8.9–10.3)
Chloride: 102 mmol/L (ref 98–111)
Creatinine, Ser: 2.14 mg/dL — ABNORMAL HIGH (ref 0.61–1.24)
GFR calc Af Amer: 44 mL/min — ABNORMAL LOW (ref >60)
GFR calc non Af Amer: 38 mL/min — ABNORMAL LOW (ref >60)
Glucose, Bld: 200 mg/dL — ABNORMAL HIGH (ref 70–99)
Potassium: 3.9 mmol/L (ref 3.5–5.1)
Sodium: 137 mmol/L (ref 135–145)

## 2017-12-15 LAB — HEMOGLOBIN A1C
Hgb A1c MFr Bld: 13.3 % — ABNORMAL HIGH (ref 4.8–5.6)
Mean Plasma Glucose: 335.01 mg/dL

## 2017-12-15 NOTE — Patient Instructions (Signed)
Continue weighing daily and call for an overnight weight gain of > 2 pounds or a weekly weight gain of >5 pounds. 

## 2017-12-16 ENCOUNTER — Encounter: Payer: Self-pay | Admitting: Family

## 2017-12-16 ENCOUNTER — Telehealth: Payer: Self-pay | Admitting: Family

## 2017-12-16 MED ORDER — FUROSEMIDE 20 MG PO TABS: 20.0000 mg | ORAL_TABLET | ORAL | 3 refills | Status: DC | PRN

## 2017-12-16 NOTE — Telephone Encounter (Signed)
Due to patient's wife's work, communication is easier via email.  Explained patient's continued elevated A1c (13.3%) which is slightly better than 14.7% January 2019. Currently taking levimer 45 units BID along with humalog 5 units at mealtimes.   Potassium level is normal (3.9) and renal function has declined slightly from 1 month ago (Creatinine 2.14/ GFR 44). Will hold furosemide and use it PRN for weight gain, edema or shortness of breath.   Due to patient now having medicaid, have scheduled him a NP appointment for 12/19/17 with [facility]. Emphasized that patient needed to go to this appointment as his diabetes needs to be under better control and may also need endocrinology referral. Patient's wife says that she will make sure that he makes it.

## 2017-12-19 ENCOUNTER — Ambulatory Visit: Payer: Medicaid Other | Admitting: Family Medicine

## 2017-12-19 ENCOUNTER — Encounter: Payer: Self-pay | Admitting: Family Medicine

## 2017-12-19 VITALS — BP 131/87 | HR 87 | Temp 98.4°F | Resp 16 | Ht 67.0 in | Wt 322.6 lb

## 2017-12-19 DIAGNOSIS — I110 Hypertensive heart disease with heart failure: Secondary | ICD-10-CM | POA: Diagnosis not present

## 2017-12-19 DIAGNOSIS — E1121 Type 2 diabetes mellitus with diabetic nephropathy: Secondary | ICD-10-CM | POA: Diagnosis not present

## 2017-12-19 DIAGNOSIS — F3340 Major depressive disorder, recurrent, in remission, unspecified: Secondary | ICD-10-CM

## 2017-12-19 DIAGNOSIS — N183 Chronic kidney disease, stage 3 unspecified: Secondary | ICD-10-CM

## 2017-12-19 DIAGNOSIS — E1165 Type 2 diabetes mellitus with hyperglycemia: Secondary | ICD-10-CM

## 2017-12-19 DIAGNOSIS — F331 Major depressive disorder, recurrent, moderate: Secondary | ICD-10-CM | POA: Insufficient documentation

## 2017-12-19 DIAGNOSIS — IMO0002 Reserved for concepts with insufficient information to code with codable children: Secondary | ICD-10-CM

## 2017-12-19 DIAGNOSIS — I10 Essential (primary) hypertension: Secondary | ICD-10-CM | POA: Diagnosis not present

## 2017-12-19 DIAGNOSIS — I5022 Chronic systolic (congestive) heart failure: Secondary | ICD-10-CM

## 2017-12-19 MED ORDER — BYDUREON 2 MG ~~LOC~~ PEN: 2.0000 mg | PEN_INJECTOR | SUBCUTANEOUS | 2 refills | Status: DC

## 2017-12-19 MED ORDER — EXENATIDE ER 2 MG/0.85ML ~~LOC~~ AUIJ: 2.0000 mg | AUTO-INJECTOR | SUBCUTANEOUS | 0 refills | Status: DC

## 2017-12-19 NOTE — Assessment & Plan Note (Signed)
Secondary to DM, HTN See A&P On ARB 

## 2017-12-19 NOTE — Assessment & Plan Note (Signed)
Severely Uncontrolled DM with hyperglycemia, A1c >13 No hypoglycemia Complications - CKD-III, CAD / Vascular other including hyperlipidemia, GERD, depression - increases risk of future cardiovascular complications / poor glucose control due to reduced lifestyle diet/exercise with low energy mood and fatigue  Contraindicated Metformin w/ CHF and CKD / concern w/ SGLT2 given CKD  Plan:  1. Discussed DM management options - suboptimal control on insulin regimen  Referral to [facility] Endocrinology given severity of poor DM control, comorbidities, limited med options, consider insulin pump in future  - Continue current therapy - w/ insulin Lantus 45u BID and Novolog 5mg  TID WC - START GLP1 agent - Bydureon BCise sample 2mg  weekly inj for 2 week supply given today - discussed benefits, risk, side effect, rare thyroid cancer, nausea - New rx sent for Bydureon Pen 2mg  weekly inj - due to medicaid ins - preferred 2. Encourage improved lifestyle - low carb, low sugar diet, reduce portion size, continue improving start regular exercise 3. Check CBG, bring log to next visit for review 4. Continue ASA, ARB, Statin 5. DM Foot exam done today / Advised to schedule DM ophtho exam, send record 6. Follow-up 3 months

## 2017-12-19 NOTE — Assessment & Plan Note (Signed)
Controlled HTN Followed by Cardiology On [place], Entrestro, Amlodipine

## 2017-12-19 NOTE — Assessment & Plan Note (Signed)
Followed by Cardiology On med management 

## 2017-12-19 NOTE — Progress Notes (Signed)
Subjective:    Patient ID: Joel [name], male    DOB: Sep 03, 1980, 37 y.o.   MRN: [id number]  Joel [name] is a 37 y.o. male presenting on 12/19/2017 for Establish Care; Diabetes; and Hypertension  Previously followed by PCP at [facility]. Now followed by Cardiology. Needs new PCP for Diabetes.  Accompanied by friend, Victorino Dike.  HPI   HYPERLIPIDEMIA: - Last lipid panel 02/2017, elevated LDL >350, low HDL, TG >250 - Currently taking Atorvastatin 80mg , tolerating well without side effects or myalgias  CHRONIC HTN - CKD-III Reports recent labs done 12/15/17 - showed Cr 2.14. Current Meds - Amlodipine 5mg  daily, Entresto, Carvedilol, Spironolactone Reports good compliance, took meds today. Tolerating well, w/o complaints. Denies CP, dyspnea, HA, edema, dizziness / lightheadedness  Heart Disease / Vascular / History of CVA with ocular occlusion Followed by [facility] FNP He admits prior to diagnosis problems, he was age 37, he had severe headaches, difficulty breathing, he was taking frequent high dose BC Powder. Last ECHO 02/2017, reduced EF 25-30%, with mild MR, has had reduced  - Cannot take NTG, cannot take Bidil due to headache. Now taking Lasxi 20mg  daily PRN - rarely using it now  CHRONIC DM, Type 2: Poorly controlled DM, referred by Cards - Last A1c 13.3 (12/15/17), previously A1c 14.7 (02/2017) CBGs: Avg 180-250, Low >180, High 300. Checks CBGs x1 every few days Meds: Levemir 45u BID (recently increased), Novolog 5u TID WC Reports good compliance. Tolerating well w/o side-effects Currently on ARB Denies hypoglycemia, polyuria, visual changes, numbness or tingling.  Health Maintenance: Due for Flu Shot, declines today despite counseling on benefits   Depression screen [facility] 2/9 12/19/2017 12/15/2017 11/12/2017  Decreased Interest 0 0 0  Down, Depressed, Hopeless 0 0 1  PHQ - 2 Score 0 0 1  Altered sleeping 0 - -  Tired, decreased energy 0 - -  Change in  appetite 0 - -  Feeling bad or failure about yourself  0 - -  Trouble concentrating 0 - -  Moving slowly or fidgety/restless 0 - -  Suicidal thoughts 0 - -  PHQ-9 Score 0 - -  Difficult doing work/chores Not difficult at all - -    Past Medical History:  Diagnosis Date  . Anxiety   . Chronic diastolic CHF (congestive heart failure) (HCC)   . Hyperlipidemia   . Hypertension   . Hypertensive heart disease   . Noncompliance with medications   . Obesity   . Stroke (HCC)   . Systolic dysfunction    a. TTE 06/2014: EF 45-50%, normal wall motion, mild MR, mild AI   Past Surgical History:  Procedure Laterality Date  . NO PAST SURGERIES     Social History   Socioeconomic History  . Marital status: Married    Spouse name: Not on file  . Number of children: 1  . Years of education: 77  . Highest education level: 12th grade  Occupational History  . Not on file  Social Needs  . Financial resource strain: Very hard  . Food insecurity:    Worry: Often true    Inability: Often true  . Transportation needs:    Medical: No    Non-medical: No  Tobacco Use  . Smoking status: Never Smoker  . Smokeless tobacco: Never Used  Substance and Sexual Activity  . Alcohol use: Not Currently    Frequency: Never    Comment: Very infrequently; ~2-3 drinks/year   . Drug  use: No  . Sexual activity: Yes    Birth control/protection: None  Lifestyle  . Physical activity:    Days per week: 2 days    Minutes per session: 30 min  . Stress: Very much  Relationships  . Social connections:    Talks on phone: Once a week    Gets together: Once a week    Attends religious service: Never    Active member of club or organization: No    Attends meetings of clubs or organizations: Never    Relationship status: Married  . Intimate partner violence:    Fear of current or ex partner: No    Emotionally abused: No    Physically abused: No    Forced sexual activity: No  Other Topics Concern  . Not on  file  Social History Narrative  . Not on file   Family History  Problem Relation Age of Onset  . Hypertension Mother   . Hyperlipidemia Mother   . Diabetes Mother   . CAD Father        a. stenting in his early 71s  . Heart disease Father   . Stroke Father   . Diabetes Maternal Grandmother   . Diabetes Maternal Grandfather   . CAD Paternal Grandmother   . CAD Paternal Grandfather   . Diabetes Paternal Uncle    Current Outpatient Medications on File Prior to Visit  Medication Sig  . amLODipine (NORVASC) 5 MG tablet Take 5 mg by mouth daily.  Marland Kitchen aspirin 81 MG chewable tablet Chew 1 tablet (81 mg total) by mouth daily.  Marland Kitchen atorvastatin (LIPITOR) 80 MG tablet Take 1 tablet (80 mg total) by mouth daily at 6 PM.  . carvedilol (COREG) 25 MG tablet Take 1 tablet (25 mg total) by mouth 2 (two) times daily with a meal.  . esomeprazole (NEXIUM) 20 MG capsule Take 20 mg by mouth daily at 12 noon.  . furosemide (LASIX) 20 MG tablet Take 1 tablet (20 mg total) by mouth as needed.  . insulin aspart (NOVOLOG) 100 UNIT/ML injection Inject 5 Units into the skin 3 (three) times daily with meals.  . insulin detemir (LEVEMIR) 100 UNIT/ML injection Inject 0.45 mLs (45 Units total) into the skin 2 (two) times daily.  . sacubitril-valsartan (ENTRESTO) 97-103 MG Take 1 tablet by mouth 2 (two) times daily.  Marland Kitchen spironolactone (ALDACTONE) 25 MG tablet Take 1 tablet (25 mg total) by mouth daily.   No current facility-administered medications on file prior to visit.     Review of Systems Per HPI unless specifically indicated above     Objective:    BP 131/87   Pulse 87   Temp 98.4 F (36.9 C) (Oral)   Resp 16   Ht 5\' 7"  (1.702 m)   Wt (!) 322 lb 9.6 oz (146.3 kg)   BMI 50.53 kg/m   Wt Readings from Last 3 Encounters:  12/19/17 (!) 322 lb 9.6 oz (146.3 kg)  12/15/17 (!) 316 lb 8 oz (143.6 kg)  11/12/17 (!) 310 lb 6 oz (140.8 kg)    Physical Exam  Constitutional: He is oriented to person, place,  and time. He appears well-developed and well-nourished. No distress.  Well-appearing, comfortable, cooperative, obese  HENT:  Head: Normocephalic and atraumatic.  Mouth/Throat: Oropharynx is clear and moist.  Neck: Normal range of motion.  Cardiovascular: Normal rate, regular rhythm, normal heart sounds and intact distal pulses.  No murmur heard. Pulmonary/Chest: Effort normal and breath sounds normal. No  respiratory distress. He has no wheezes. He has no rales.  Musculoskeletal: He exhibits no edema (none currently).  Neurological: He is alert and oriented to person, place, and time.  Skin: Skin is warm and dry. No rash noted. He is not diaphoretic. No erythema.  Psychiatric: He has a normal mood and affect. His behavior is normal.  Well groomed, good eye contact, normal speech and thoughts  Nursing note and vitals reviewed.    Diabetic Foot Exam - Simple   Simple Foot Form Diabetic Foot exam was performed with the following findings:  Yes 12/19/2017  8:45 AM  Visual Inspection See comments:  Yes Sensation Testing Intact to touch and monofilament testing bilaterally:  Yes Pulse Check Posterior Tibialis and Dorsalis pulse intact bilaterally:  Yes Comments Bilateral feet with some callus formation on heels, and plantar aspect near great toe. Uncomplicated without ulceration.     Results for orders placed or performed in visit on 12/15/17  Basic metabolic panel  Result Value Ref Range   Sodium 137 135 - 145 mmol/L   Potassium 3.9 3.5 - 5.1 mmol/L   Chloride 102 98 - 111 mmol/L   CO2 27 22 - 32 mmol/L   Glucose, Bld 200 (H) 70 - 99 mg/dL   BUN 32 (H) 6 - 20 mg/dL   Creatinine, Ser 9.60 (H) 0.61 - 1.24 mg/dL   Calcium 9.5 8.9 - 45.4 mg/dL   GFR calc non Af Amer 38 (L) >60 mL/min   GFR calc Af Amer 44 (L) >60 mL/min   Anion gap 8 5 - 15  HgB A1c  Result Value Ref Range   Hgb A1c MFr Bld 13.3 (H) 4.8 - 5.6 %   Mean Plasma Glucose 335.01 mg/dL      Assessment & Plan:    Problem List Items Addressed This Visit    Chronic kidney disease (CKD), stage III (moderate) (HCC)    Secondary to DM, HTN See A&P On ARB      Depression, major, recurrent, in remission (HCC)    Currently stable in remission      Essential hypertension    Controlled HTN Followed by Cardiology On Spiro, Coreg, Entrestro, Amlodipine      Hypertensive heart disease with heart failure (HCC)    Followed by Cardiology On med management      Systolic CHF, chronic (HCC)    Currently stable, euvolemic Followed by Cardiology Prior ECHO 05/2017, reduced EF On med management currently      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 Discussed DM management options - suboptimal control on insulin regimen  Referral to [facility] Endocrinology given severity of poor DM control, comorbidities, limited med options, consider insulin pump in future  - Continue current therapy - w/ insulin Lantus 45u BID and Novolog 5mg  TID WC - START GLP1 agent - Bydureon BCise sample 2mg  weekly inj for 2 week supply given today - discussed benefits, risk, side effect, rare thyroid cancer, nausea - New rx sent for Bydureon Pen 2mg  weekly inj - due to medicaid ins - preferred 2. Encourage improved lifestyle - low carb, low sugar diet,  reduce portion size, continue improving start regular exercise 3. Check CBG, bring log to next visit for review 4. Continue ASA, ARB, Statin 5. DM Foot exam done today / Advised to schedule DM ophtho exam, send record 6. Follow-up 3 months       Relevant  Medications   BYDUREON 2 MG PEN   Other Relevant Orders   Ambulatory referral to Endocrinology      Meds ordered this encounter  Medications  . DISCONTD: Exenatide ER (BYDUREON BCISE) 2 MG/0.85ML AUIJ    Sig: Inject 2 mg into the skin once a week.    Dispense:  2 pen    Refill:  0  . BYDUREON 2 MG PEN    Sig: Inject 2 mg into the skin once a week.    Dispense:  4 each    Refill:  2    Bydureon Pen for Medicaid PDL   Orders Placed This Encounter  Procedures  . Ambulatory referral to Endocrinology    Referral Priority:   Routine    Referral Type:   Consultation    Referral Reason:   Specialty Services Required    Referred to Provider:   Raj Janus, MD    Requested Specialty:   Endocrinology    Number of Visits Requested:   1    Follow up plan: Return in about 3 months (around 03/21/2018), or if symptoms worsen or fail to improve, for Follow-up from endocrine/DM, HTN, CHF.  Saralyn Pilar, DO [facility] 12/19/2017, 1:33 PM

## 2017-12-19 NOTE — Assessment & Plan Note (Signed)
Currently stable, euvolemic Followed by Cardiology Prior ECHO 05/2017, reduced EF On med management currently

## 2017-12-19 NOTE — Assessment & Plan Note (Signed)
Currently stable in remission

## 2017-12-19 NOTE — Patient Instructions (Addendum)
Thank you for coming to the office today.  Referral to [facility] Dr Tedd Sias - for Diabetes management - discuss with them the new once weekly injection and adjusting dose of insulin, also with possibility of insulin pump  [facility] [address] [place], [place]  [address] Phone: [phone]  Printed rx Bydureon Pen 2mg  weekly injection - 1 per pen - 4 pen per month - see instructions within medicine and ask pharmacist to clarify proper use. Should be able to view video online at website. We do not have a sample of this one to show.  Continue current insulin regimen.  May get a small knot or bump at skin site because medicine takes 1 week to get absorbed into system. This is normal. It is not a side effect.  May get some nausea, take with food.   Please schedule a Follow-up Appointment to: Return in about 3 months (around 03/21/2018), or if symptoms worsen or fail to improve, for Follow-up from endocrine/DM, HTN, CHF.  If you have any other questions or concerns, please feel free to call the office or send a message through MyChart. You may also schedule an earlier appointment if necessary.  Additionally, you may be receiving a survey about your experience at our office within a few days to 1 week by e-mail or mail. We value your feedback.  Saralyn Pilar, DO [facility], [place]

## 2018-01-01 ENCOUNTER — Encounter: Payer: Self-pay | Admitting: Pharmacist

## 2018-01-02 ENCOUNTER — Other Ambulatory Visit: Payer: Self-pay

## 2018-01-02 ENCOUNTER — Encounter: Payer: Self-pay | Admitting: Family

## 2018-01-02 DIAGNOSIS — E1165 Type 2 diabetes mellitus with hyperglycemia: Secondary | ICD-10-CM

## 2018-01-02 DIAGNOSIS — IMO0002 Reserved for concepts with insufficient information to code with codable children: Secondary | ICD-10-CM

## 2018-01-02 DIAGNOSIS — E1121 Type 2 diabetes mellitus with diabetic nephropathy: Secondary | ICD-10-CM

## 2018-01-02 MED ORDER — BYDUREON 2 MG ~~LOC~~ PEN: 2.0000 mg | PEN_INJECTOR | SUBCUTANEOUS | 2 refills | Status: DC

## 2018-01-05 NOTE — Telephone Encounter (Signed)
Can you check with this pharmacy about an override or prior auth for Bydureon Pen for this medicaid patient?  Or did you already receive paperwork on this?  Also - he was referred to Endocrine and I do not see any appointment but it says referral is closed - could you check into this as well?  Saralyn Pilar, DO [facility] 01/05/2018, 6:49 PM

## 2018-01-13 ENCOUNTER — Other Ambulatory Visit: Payer: Self-pay | Admitting: Family Medicine

## 2018-01-13 DIAGNOSIS — E1165 Type 2 diabetes mellitus with hyperglycemia: Secondary | ICD-10-CM

## 2018-01-13 DIAGNOSIS — E1121 Type 2 diabetes mellitus with diabetic nephropathy: Secondary | ICD-10-CM

## 2018-01-13 DIAGNOSIS — IMO0002 Reserved for concepts with insufficient information to code with codable children: Secondary | ICD-10-CM

## 2018-01-13 MED ORDER — INSULIN DETEMIR 100 UNIT/ML ~~LOC~~ SOLN: 45.0000 [IU] | Freq: Two times a day (BID) | SUBCUTANEOUS | 0 refills | Status: DC

## 2018-01-13 NOTE — Telephone Encounter (Signed)
As per patient's spouse patient need Rx for Levemir was Rx by cardiologist in past he had also send my chart message.

## 2018-01-13 NOTE — Telephone Encounter (Signed)
Sent 30- day fill of 2 vials Levemir.  Patient will need to follow-up with endocrinology as requested by Dr. Kirtland Bouchard.  Please find out if patient has appointment at [facility] endocrinology.

## 2018-01-20 NOTE — Telephone Encounter (Signed)
.  mychart

## 2018-01-26 ENCOUNTER — Other Ambulatory Visit: Payer: Self-pay | Admitting: Family

## 2018-01-26 ENCOUNTER — Ambulatory Visit: Payer: Medicaid Other | Admitting: Family

## 2018-01-28 ENCOUNTER — Encounter
Admission: RE | Admit: 2018-01-28 | Discharge: 2018-01-28 | Disposition: A | Discharge status: Home / Self Care | Payer: Medicaid Other | Source: Ambulatory Visit | Attending: Family | Admitting: Family

## 2018-01-28 DIAGNOSIS — Z01812 Encounter for preprocedural laboratory examination: Secondary | ICD-10-CM | POA: Insufficient documentation

## 2018-01-28 LAB — BASIC METABOLIC PANEL
Anion gap: 9 (ref 5–15)
BUN: 25 mg/dL — ABNORMAL HIGH (ref 6–20)
CO2: 26 mmol/L (ref 22–32)
Calcium: 9.1 mg/dL (ref 8.9–10.3)
Chloride: 100 mmol/L (ref 98–111)
Creatinine, Ser: 1.77 mg/dL — ABNORMAL HIGH (ref 0.61–1.24)
GFR calc Af Amer: 56 mL/min — ABNORMAL LOW (ref >60)
GFR calc non Af Amer: 48 mL/min — ABNORMAL LOW (ref >60)
Glucose, Bld: 214 mg/dL — ABNORMAL HIGH (ref 70–99)
Potassium: 4 mmol/L (ref 3.5–5.1)
Sodium: 135 mmol/L (ref 135–145)

## 2018-02-01 DIAGNOSIS — IMO0002 Reserved for concepts with insufficient information to code with codable children: Secondary | ICD-10-CM

## 2018-02-01 DIAGNOSIS — E1121 Type 2 diabetes mellitus with diabetic nephropathy: Secondary | ICD-10-CM

## 2018-02-01 DIAGNOSIS — E1165 Type 2 diabetes mellitus with hyperglycemia: Secondary | ICD-10-CM

## 2018-02-02 MED ORDER — INSULIN DETEMIR 100 UNIT/ML ~~LOC~~ SOLN: 45.0000 [IU] | Freq: Two times a day (BID) | SUBCUTANEOUS | 0 refills | Status: DC

## 2018-02-09 ENCOUNTER — Other Ambulatory Visit: Payer: Self-pay

## 2018-02-09 DIAGNOSIS — E1165 Type 2 diabetes mellitus with hyperglycemia: Secondary | ICD-10-CM

## 2018-02-09 DIAGNOSIS — IMO0002 Reserved for concepts with insufficient information to code with codable children: Secondary | ICD-10-CM

## 2018-02-09 DIAGNOSIS — E1121 Type 2 diabetes mellitus with diabetic nephropathy: Secondary | ICD-10-CM

## 2018-02-10 ENCOUNTER — Ambulatory Visit: Payer: Medicaid Other | Attending: Family | Admitting: Family

## 2018-02-10 ENCOUNTER — Encounter: Payer: Self-pay | Admitting: Family

## 2018-02-10 VITALS — BP 144/97 | HR 114 | Resp 18 | Ht 67.0 in | Wt 329.2 lb

## 2018-02-10 DIAGNOSIS — E669 Obesity, unspecified: Secondary | ICD-10-CM | POA: Insufficient documentation

## 2018-02-10 DIAGNOSIS — I5042 Chronic combined systolic (congestive) and diastolic (congestive) heart failure: Secondary | ICD-10-CM | POA: Diagnosis present

## 2018-02-10 DIAGNOSIS — N189 Chronic kidney disease, unspecified: Secondary | ICD-10-CM | POA: Diagnosis not present

## 2018-02-10 DIAGNOSIS — E1122 Type 2 diabetes mellitus with diabetic chronic kidney disease: Secondary | ICD-10-CM | POA: Insufficient documentation

## 2018-02-10 DIAGNOSIS — E785 Hyperlipidemia, unspecified: Secondary | ICD-10-CM | POA: Diagnosis not present

## 2018-02-10 DIAGNOSIS — Z8249 Family history of ischemic heart disease and other diseases of the circulatory system: Secondary | ICD-10-CM | POA: Insufficient documentation

## 2018-02-10 DIAGNOSIS — Z79899 Other long term (current) drug therapy: Secondary | ICD-10-CM | POA: Insufficient documentation

## 2018-02-10 DIAGNOSIS — Z888 Allergy status to other drugs, medicaments and biological substances status: Secondary | ICD-10-CM | POA: Diagnosis not present

## 2018-02-10 DIAGNOSIS — I130 Hypertensive heart and chronic kidney disease with heart failure and stage 1 through stage 4 chronic kidney disease, or unspecified chronic kidney disease: Secondary | ICD-10-CM | POA: Insufficient documentation

## 2018-02-10 DIAGNOSIS — Z8673 Personal history of transient ischemic attack (TIA), and cerebral infarction without residual deficits: Secondary | ICD-10-CM | POA: Insufficient documentation

## 2018-02-10 DIAGNOSIS — I10 Essential (primary) hypertension: Secondary | ICD-10-CM

## 2018-02-10 DIAGNOSIS — E1121 Type 2 diabetes mellitus with diabetic nephropathy: Secondary | ICD-10-CM

## 2018-02-10 DIAGNOSIS — E1165 Type 2 diabetes mellitus with hyperglycemia: Secondary | ICD-10-CM

## 2018-02-10 DIAGNOSIS — I5022 Chronic systolic (congestive) heart failure: Secondary | ICD-10-CM

## 2018-02-10 DIAGNOSIS — Z794 Long term (current) use of insulin: Secondary | ICD-10-CM | POA: Insufficient documentation

## 2018-02-10 DIAGNOSIS — Z7982 Long term (current) use of aspirin: Secondary | ICD-10-CM | POA: Diagnosis not present

## 2018-02-10 DIAGNOSIS — IMO0002 Reserved for concepts with insufficient information to code with codable children: Secondary | ICD-10-CM

## 2018-02-10 NOTE — Patient Instructions (Signed)
Continue weighing daily and call for an overnight weight gain of > 2 pounds or a weekly weight gain of >5 pounds. 

## 2018-02-10 NOTE — Progress Notes (Signed)
Bmp \  Patient ID: Joel [name], male    DOB: [birth year], 37 y.o.   MRN: [id number]  HPI  Joel [name] is a 37 y/o male with a history of CKD, DM, HTN and chronic heart failure.   Echo report from 02/12/17 reviewed and showed an EF of 25-30% along with mild Joel. EF has declined from 2016.  Has not been admitted or been in the ED in the last 6 months.  He presents today for a follow-up visit with a chief complaint of minimal shortness of breath upon moderate exertion. He describes this as being intermittent in nature over a few years. He has associated blurry vision, headaches and slight weight gain along with this. He denies any difficulty sleeping, dizziness, cough, fatigue, chest pain, pedal edema, palpitations or abdominal distention. Says that his glucose is now running in the 200's.   Past Medical History:  Diagnosis Date  . Anxiety   . Chronic diastolic CHF (congestive heart failure) (HCC)   . Hyperlipidemia   . Hypertension   . Hypertensive heart disease   . Noncompliance with medications   . Obesity   . Stroke (HCC)   . Systolic dysfunction    a. TTE 06/2014: EF 45-50%, normal wall motion, mild Joel, mild AI   Past Surgical History:  Procedure Laterality Date  . NO PAST SURGERIES     Family History  Problem Relation Age of Onset  . Hypertension Mother   . Hyperlipidemia Mother   . Diabetes Mother   . CAD Father        a. stenting in his early 25s  . Heart disease Father   . Stroke Father   . Diabetes Maternal Grandmother   . Diabetes Maternal Grandfather   . CAD Paternal Grandmother   . CAD Paternal Grandfather   . Diabetes Paternal Uncle    Social History   Tobacco Use  . Smoking status: Never Smoker  . Smokeless tobacco: Never Used  Substance Use Topics  . Alcohol use: Not Currently    Frequency: Never    Comment: Very infrequently; ~2-3 drinks/year    Allergies  Allergen Reactions  . Bidil [Isosorb Dinitrate-Hydralazine] Other (See Comments)    Severe  headache  . Nitroglycerin Other (See Comments)    Causes severe headaches   Prior to Admission medications   Medication Sig Start Date End Date Taking? Authorizing Provider  amLODipine (NORVASC) 5 MG tablet Take 5 mg by mouth daily.   Yes [provider]  aspirin 81 MG chewable tablet Chew 1 tablet (81 mg total) by mouth daily. 10/13/14  Yes Milagros Loll, MD  atorvastatin (LIPITOR) 80 MG tablet Take 1 tablet (80 mg total) by mouth daily at 6 PM. 11/25/17  Yes Hackney, Tina A, FNP  BYDUREON 2 MG PEN Inject 2 mg into the skin once a week. 01/02/18  Yes Joel [name], Netta Neat, DO  carvedilol (COREG) 25 MG tablet Take 1 tablet (25 mg total) by mouth 2 (two) times daily with a meal. 03/11/17  Yes Hackney, Tina A, FNP  esomeprazole (NEXIUM) 20 MG capsule Take 20 mg by mouth daily at 12 noon.   Yes [provider]  furosemide (LASIX) 20 MG tablet Take 1 tablet (20 mg total) by mouth as needed. 12/16/17  Yes Hackney, [name], FNP  insulin aspart (NOVOLOG) 100 UNIT/ML injection Inject 5 Units into the skin 3 (three) times daily with meals. 02/14/17  Yes [name], MD  insulin detemir (LEVEMIR) 100 UNIT/ML injection  Inject 0.45 mLs (45 Units total) into the skin 2 (two) times daily. 02/02/18  Yes Joel [name], Netta NeatAlexander J, DO  sacubitril-valsartan (ENTRESTO) 97-103 MG Take 1 tablet by mouth 2 (two) times daily. 06/30/17  Yes Clarisa KindredHackney, Tina A, FNP  spironolactone (ALDACTONE) 25 MG tablet Take 1 tablet (25 mg total) by mouth daily. 11/12/17 02/10/18 Yes Delma FreezeHackney, Tina A, FNP    Review of Systems  Constitutional: Negative for appetite change and fatigue.  HENT: Negative for congestion, postnasal drip and sore throat.   Eyes: Positive for visual disturbance (blurry vision).  Respiratory: Positive for shortness of breath. Negative for cough and chest tightness.   Cardiovascular: Negative for palpitations and leg swelling.  Gastrointestinal: Negative for abdominal distention and abdominal  pain.  Endocrine: Negative.   Genitourinary: Negative.   Musculoskeletal: Negative for back pain and neck pain.  Skin: Negative.   Allergic/Immunologic: Negative.   Neurological: Positive for headaches (yesterday). Negative for dizziness and light-headedness.  Hematological: Negative for adenopathy. Does not bruise/bleed easily.  Psychiatric/Behavioral: Negative for dysphoric mood and sleep disturbance. The patient is not nervous/anxious.    Vitals:   02/10/18 1539  BP: (!) 144/97  Pulse: (!) 114  Resp: 18  SpO2: 97%  Weight: (!) 329 lb 4 oz (149.3 kg)  Height: 5\' 7"  (1.702 m)   Wt Readings from Last 3 Encounters:  02/10/18 (!) 329 lb 4 oz (149.3 kg)  12/19/17 (!) 322 lb 9.6 oz (146.3 kg)  12/15/17 (!) 316 lb 8 oz (143.6 kg)   Lab Results  Component Value Date   CREATININE 1.77 (H) 01/28/2018   CREATININE 2.14 (H) 12/15/2017   CREATININE 1.71 (H) 11/12/2017    Physical Exam  Constitutional: He is oriented to person, place, and time. He appears well-developed and well-nourished.  HENT:  Head: Normocephalic and atraumatic.  Neck: Normal range of motion. Neck supple. No JVD present.  Cardiovascular: Normal rate and regular rhythm.  Pulmonary/Chest: Effort normal. He has no wheezes. He has no rales.  Abdominal: Soft. He exhibits no distension. There is no abdominal tenderness.  Musculoskeletal:        General: No tenderness or edema.  Neurological: He is alert and oriented to person, place, and time.  Skin: Skin is warm and dry.  Psychiatric: He has a normal mood and affect. His behavior is normal. Thought content normal.  Nursing note and vitals reviewed.   Assessment & Plan:  1: Chronic heart failure with reduced ejection fraction- - NYHA class II - euvolemic today - weighing daily and says that his weight has gradually risen over the Christmas holiday. Reminded to call for an overnight weight gain of >2 pounds or a weekly weight gain of >5 pounds - weight up 6  pounds since last here 1 month ago - using Mrs. Dash for seasoning & has been reading food labels for sodium content. Encouraged to keep daily sodium intake to 2000mg  daily - unable to tolerate bidil as it gave him a headache - BNP on 02/12/17 was 309.0 - saw cardiologist Joel [name](Gollan) 02/26/17 - may need to consider corlanor if he remains tachycardic  2: HTN- - BP mildly elevated today but his weight is up 6 pounds - now taking furosemide as needed due to renal function; hasn't had any in the last few weeks - BMP from 01/28/18 was reviewed and showed sodium 135, potassium 4.0, creatinine 1.77 and GFR 56 - saw PCP Joel [name](Joel [name]) 12/19/17 - goes to the gym 3 times/week for ~ 1-2 hours at a  time  3: Diabetes- - glucose at home has been running in the 200's - A1c on 12/15/17 was 13.3% - has initial appointment with endocrinology Joel [name]) February 2020  Medication bottles were reviewed.  Return in 3 months or sooner for any questions/problems before then.

## 2018-02-12 ENCOUNTER — Encounter: Payer: Self-pay | Admitting: Family

## 2018-02-19 ENCOUNTER — Telehealth: Payer: Self-pay | Admitting: Pharmacy Technician

## 2018-02-19 NOTE — Telephone Encounter (Signed)
Patient has Medicaid.  No longer meets [facility]'s eligibility criteria.  Betty J. Kluttz Care Manager [facility] 

## 2018-02-20 ENCOUNTER — Other Ambulatory Visit: Payer: Self-pay

## 2018-02-20 MED ORDER — SPIRONOLACTONE 25 MG PO TABS: 25.0000 mg | ORAL_TABLET | Freq: Every day | ORAL | 3 refills | Status: DC

## 2018-02-23 ENCOUNTER — Other Ambulatory Visit: Payer: Self-pay | Admitting: Family

## 2018-02-23 MED ORDER — CARVEDILOL 25 MG PO TABS: 25.0000 mg | ORAL_TABLET | Freq: Two times a day (BID) | ORAL | 3 refills | Status: DC

## 2018-02-23 MED ORDER — ATORVASTATIN CALCIUM 80 MG PO TABS
80.0000 mg | ORAL_TABLET | Freq: Every day | ORAL | 3 refills | Status: DC
Start: 2018-02-23 — End: 2019-03-31

## 2018-02-23 MED ORDER — AMLODIPINE BESYLATE 5 MG PO TABS: 5.0000 mg | ORAL_TABLET | Freq: Every day | ORAL | 3 refills | Status: DC

## 2018-02-23 MED ORDER — SPIRONOLACTONE 25 MG PO TABS: 25.0000 mg | ORAL_TABLET | Freq: Every day | ORAL | 3 refills | Status: DC

## 2018-02-25 LAB — HM DIABETES EYE EXAM

## 2018-03-08 ENCOUNTER — Encounter: Payer: Self-pay | Admitting: Family

## 2018-03-08 ENCOUNTER — Other Ambulatory Visit: Payer: Self-pay

## 2018-03-08 ENCOUNTER — Other Ambulatory Visit: Payer: Self-pay | Admitting: Family Medicine

## 2018-03-08 DIAGNOSIS — E1165 Type 2 diabetes mellitus with hyperglycemia: Secondary | ICD-10-CM

## 2018-03-08 DIAGNOSIS — E1121 Type 2 diabetes mellitus with diabetic nephropathy: Secondary | ICD-10-CM

## 2018-03-08 DIAGNOSIS — IMO0002 Reserved for concepts with insufficient information to code with codable children: Secondary | ICD-10-CM

## 2018-03-09 ENCOUNTER — Ambulatory Visit: Payer: Medicaid Other | Admitting: Family Medicine

## 2018-03-20 ENCOUNTER — Ambulatory Visit (INDEPENDENT_AMBULATORY_CARE_PROVIDER_SITE_OTHER): Payer: Medicaid Other | Admitting: Family Medicine

## 2018-03-20 VITALS — BP 134/87 | HR 98 | Temp 98.6°F | Ht 67.0 in | Wt 332.5 lb

## 2018-03-20 DIAGNOSIS — I110 Hypertensive heart disease with heart failure: Secondary | ICD-10-CM

## 2018-03-20 DIAGNOSIS — E1121 Type 2 diabetes mellitus with diabetic nephropathy: Secondary | ICD-10-CM

## 2018-03-20 DIAGNOSIS — IMO0002 Reserved for concepts with insufficient information to code with codable children: Secondary | ICD-10-CM

## 2018-03-20 DIAGNOSIS — F331 Major depressive disorder, recurrent, moderate: Secondary | ICD-10-CM

## 2018-03-20 DIAGNOSIS — N183 Chronic kidney disease, stage 3 unspecified: Secondary | ICD-10-CM

## 2018-03-20 DIAGNOSIS — E1165 Type 2 diabetes mellitus with hyperglycemia: Secondary | ICD-10-CM

## 2018-03-20 DIAGNOSIS — I428 Other cardiomyopathies: Secondary | ICD-10-CM

## 2018-03-20 DIAGNOSIS — I5022 Chronic systolic (congestive) heart failure: Secondary | ICD-10-CM

## 2018-03-21 NOTE — Progress Notes (Signed)
Subjective:    Patient ID: Joel [name], male    DOB: [birth year], 38 y.o.   MRN: [id number]  Joel [name] is a 38 y.o. male presenting on 03/20/2018 for Diabetes and Depression   HPI   CHRONIC DM, Type 2, uncontrolled, with CKD-III Followed by [facility] since 02/2018, last visit they added Jardiance 10mg  dailiy, and adjusted Humalog to 10u with meals AM / afternoon / and 15u dinner, with optional +4 units if >200 fasting Meds: Levemir 45u BID, Jardiance 10, Humalog 10-10-15u wc, Bydureon  Reports  good compliance. Tolerating well w/o side-effects Currently on ARB Denies hypoglycemia, polyuria, visual changes, numbness or tingling.  Chronic Heart Failure with Reduced Ejection Fraction Followed by [facility] F[facility] Last ECHO 02/2017, reduced EF 25-30%, with mild MR, has had reduced  - Cannot take NTG, cannot take Bidil due to headache. Now taking Lasxi 20mg  daily PRN - rarely using it now  Major Depression, recurrent chronic, moderate Followed by [facility]. He has been off medication for while now. He is not interested in new medication or additional therapy at this time.   Depression screen [facility] 2/9 03/22/2018 12/19/2017 12/15/2017  Decreased Interest 3 0 0  Down, Depressed, Hopeless 2 0 0  PHQ - 2 Score 5 0 0  Altered sleeping 1 0 -  Tired, decreased energy 3 0 -  Change in appetite 3 0 -  Feeling bad or failure about yourself  3 0 -  Trouble concentrating 1 0 -  Moving slowly or fidgety/restless 2 0 -  Suicidal thoughts 2 0 -  PHQ-9 Score 20 0 -  Difficult doing work/chores Extremely dIfficult Not difficult at all -   [place]-Suicide Severity Rating Scale 1) Have you wished you were dead or wished you could go to sleep and not wake up? - Yes  2) Have you had any actual thoughts of killing yourself? - No  Skip questions 3,4, 5  6) Have you ever done anything, started to do anything, or prepared to do anything to end your life? - No   GAD 7 : Generalized  Anxiety Score 03/22/2018 12/15/2017  Nervous, Anxious, on Edge 3 0  Control/stop worrying 3 0  Worry too much - different things 3 0  Trouble relaxing 1 0  Restless 2 0  Easily annoyed or irritable 3 -  Afraid - awful might happen 3 0  Total GAD 7 Score 18 -  Anxiety Difficulty Very difficult Not difficult at all    Social History   Tobacco Use  . Smoking status: Never Smoker  . Smokeless tobacco: Never Used  Substance Use Topics  . Alcohol use: Not Currently    Frequency: Never    Comment: Very infrequently; ~2-3 drinks/year   . Drug use: No    Review of Systems Per HPI unless specifically indicated above     Objective:    BP 134/87 (BP Location: Left Arm, Patient Position: Sitting, Cuff Size: Normal)   Pulse 98   Temp 98.6 F (37 C) (Oral)   Ht 5\' 7"  (1.702 m)   Wt (!) 332 lb 8 oz (150.8 kg)   BMI 52.08 kg/m   Wt Readings from Last 3 Encounters:  03/22/18 (!) 332 lb 8 oz (150.8 kg)  02/10/18 (!) 329 lb 4 oz (149.3 kg)  12/19/17 (!) 322 lb 9.6 oz (146.3 kg)    Physical Exam Vitals signs and nursing note reviewed.  Constitutional:      General:  He is not in acute distress.    Appearance: He is well-developed. He is not diaphoretic.     Comments: Well-appearing, comfortable, cooperative, obese  HENT:     Head: Normocephalic and atraumatic.  Eyes:     General:        Right eye: No discharge.        Left eye: No discharge.     Conjunctiva/sclera: Conjunctivae normal.  Cardiovascular:     Rate and Rhythm: Normal rate.  Pulmonary:     Effort: Pulmonary effort is normal.  Skin:    General: Skin is warm and dry.     Findings: No erythema or rash.  Neurological:     Mental Status: He is alert and oriented to person, place, and time.  Psychiatric:        Behavior: Behavior normal.     Comments: Well groomed, good eye contact, normal speech and thoughts    Results for orders placed or performed in visit on 03/20/18  HM DIABETES EYE EXAM  Result Value Ref  Range   HM Diabetic Eye Exam No Retinopathy No Retinopathy      Assessment & Plan:   Problem List Items Addressed This Visit    Chronic kidney disease (CKD), stage III (moderate) (HCC)    Secondary to DM, HTN See A&P On ARB      Depression, major, recurrent, moderate (HCC)    Seems to be uncontrolled now, recent worsening Followed by [facility] Not on medication management currently Elevated score to passive suicidal ideation, declines active suicidal intention      Hypertensive heart disease with heart failure (HCC)    Followed by [facility] On med management and diuretic      Nonischemic cardiomyopathy (HCC)    Underlying cause of CHF      Systolic CHF, chronic (HCC)    Currently stable, euvolemic Followed by Cardiology Prior ECHO 2019, reduced EF On med management currently      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) - Primary    Previously Uncontrolled DM with hyperglycemia, A1c >13 No hypoglycemia Complications - CKD-III, CAD / Vascular other including hyperlipidemia, GERD, depression - increases risk of future cardiovascular complications / poor glucose control due to reduced lifestyle diet/exercise with low energy mood and fatigue  Contraindicated Metformin w/ CHF and CKD / concern w/ SGLT2 given CKD  Plan:  Follow with [facility] Endocr[facility]ology for DM management On SGLT2 now per endocrine Jardiance On Bydureon, Levemir Recently adjusted mealtime insulin dosing  Encourage improved lifestyle - low carb, low sugar diet, reduce portion size, continue improving start regular exercise Continue ASA, ARB, Statin  Follow-up 6 months          No orders of the defined types were placed in this encounter.     Follow up plan: Return in about 6 months (around 09/18/2018) for Annual Physical.  Joel Pilar, DO [facility] 03/20/2018, 4:28 PM

## 2018-03-22 ENCOUNTER — Encounter: Payer: Self-pay | Admitting: Family Medicine

## 2018-03-22 NOTE — Assessment & Plan Note (Signed)
Followed by [facility] On med management and diuretic

## 2018-03-22 NOTE — Assessment & Plan Note (Signed)
Secondary to DM, HTN See A&P On ARB

## 2018-03-22 NOTE — Assessment & Plan Note (Signed)
Previously Uncontrolled DM with hyperglycemia, A1c >13 No hypoglycemia Complications - CKD-III, CAD / Vascular other including hyperlipidemia, GERD, depression - increases risk of future cardiovascular complications / poor glucose control due to reduced lifestyle diet/exercise with low energy mood and fatigue  Contraindicated Metformin w/ CHF and CKD / concern w/ SGLT2 given CKD  Plan:  Follow with [facility] Endocrinology for DM management On SGLT2 now per endocrine Jardiance On Bydureon, Levemir Recently adjusted mealtime insulin dosing  Encourage improved lifestyle - low carb, low sugar diet, reduce portion size, continue improving start regular exercise Continue ASA, ARB, Statin  Follow-up 6 months

## 2018-03-22 NOTE — Assessment & Plan Note (Signed)
Underlying cause of CHF

## 2018-03-22 NOTE — Assessment & Plan Note (Signed)
Currently stable, euvolemic Followed by Cardiology Prior ECHO 2019, reduced EF On med management currently

## 2018-03-22 NOTE — Assessment & Plan Note (Signed)
Seems to be uncontrolled now, recent worsening Followed by [facility] Not on medication management currently Elevated score to passive suicidal ideation, declines active suicidal intention

## 2018-03-22 NOTE — Patient Instructions (Signed)
NOTE - Chart was completed after office visit - due to no access to internet / CHL.  AVS instructions were hand written and given to patient during office visit on 03/20/18.  

## 2018-05-08 MED ORDER — INSULIN DETEMIR 100 UNIT/ML ~~LOC~~ SOLN: 45.0000 [IU] | Freq: Two times a day (BID) | SUBCUTANEOUS | 2 refills | Status: DC

## 2018-05-12 ENCOUNTER — Encounter: Payer: Self-pay | Admitting: Family

## 2018-05-18 ENCOUNTER — Ambulatory Visit: Payer: Medicaid Other | Admitting: Family

## 2018-05-21 ENCOUNTER — Telehealth: Payer: Self-pay

## 2018-05-21 ENCOUNTER — Other Ambulatory Visit: Payer: Self-pay

## 2018-05-21 ENCOUNTER — Telehealth (INDEPENDENT_AMBULATORY_CARE_PROVIDER_SITE_OTHER): Payer: Medicaid Other | Admitting: Cardiovascular Disease

## 2018-05-21 DIAGNOSIS — N183 Chronic kidney disease, stage 3 unspecified: Secondary | ICD-10-CM

## 2018-05-21 DIAGNOSIS — I10 Essential (primary) hypertension: Secondary | ICD-10-CM | POA: Diagnosis not present

## 2018-05-21 DIAGNOSIS — I5022 Chronic systolic (congestive) heart failure: Secondary | ICD-10-CM | POA: Diagnosis not present

## 2018-05-21 DIAGNOSIS — I110 Hypertensive heart disease with heart failure: Secondary | ICD-10-CM

## 2018-05-21 DIAGNOSIS — E1022 Type 1 diabetes mellitus with diabetic chronic kidney disease: Secondary | ICD-10-CM

## 2018-05-21 DIAGNOSIS — I420 Dilated cardiomyopathy: Secondary | ICD-10-CM | POA: Diagnosis not present

## 2018-05-21 NOTE — Patient Instructions (Addendum)
Medication Instructions:  No changes  If you need a refill on your cardiac medications before your next appointment, please call your pharmacy.    Lab work: No new labs needed   If you have labs (blood work) drawn today and your tests are completely normal, you will receive your results only by: Marland Kitchen MyChart Message (if you have MyChart) OR . A paper copy in the mail If you have any lab test that is abnormal or we need to change your treatment, we will call you to review the results.   Testing/Procedures: Echocardiogram for dilated cardiomyopathy after virus resolves (2 to 3 months) With BMP same day  Your physician has requested that you have an echocardiogram. Echocardiography is a painless test that uses sound waves to create images of your heart. It provides your doctor with information about the size and shape of your heart and how well your heart's chambers and valves are working. This procedure takes approximately one hour. There are no restrictions for this procedure.    Follow-Up: At [facility], you and your health needs are our priority.  As part of our continuing mission to provide you with exceptional heart care, we have created designated Provider Care Teams.  These Care Teams include your primary Cardiologist (physician) and Advanced Practice Providers (APPs -  Physician Assistants and Nurse Practitioners) who all work together to provide you with the care you need, when you need it.  . You will need a follow up appointment in 12 months .   Please call our office 2 months in advance to schedule this appointment.    . Providers on your designated Care Team:   . Nicolasa Ducking, NP . Eula Listen, PA-C . Marisue Ivan, PA-C  Any Other Special Instructions Will Be Listed Below (If Applicable).  For educational health videos Log in to : [email or web address] Or : [email or web address], password : [email or web address]   Echocardiogram An echocardiogram is a procedure that uses painless  sound waves (ultrasound) to produce an image of the heart. Images from an echocardiogram can provide important information about:  Signs of coronary artery disease (CAD).  Aneurysm detection. An aneurysm is a weak or damaged part of an artery wall that bulges out from the normal force of blood pumping through the body.  Heart size and shape. Changes in the size or shape of the heart can be associated with certain conditions, including heart failure, aneurysm, and CAD.  Heart muscle function.  Heart valve function.  Signs of a past heart attack.  Fluid buildup around the heart.  Thickening of the heart muscle.  A tumor or infectious growth around the heart valves. Tell a health care provider about:  Any allergies you have.  All medicines you are taking, including vitamins, herbs, eye drops, creams, and over-the-counter medicines.  Any blood disorders you have.  Any surgeries you have had.  Any medical conditions you have.  Whether you are pregnant or may be pregnant. What are the risks? Generally, this is a safe procedure. However, problems may occur, including:  Allergic reaction to dye (contrast) that may be used during the procedure. What happens before the procedure? No specific preparation is needed. You may eat and drink normally. What happens during the procedure?   An IV tube may be inserted into one of your veins.  You may receive contrast through this tube. A contrast is an injection that improves the quality of the pictures from your heart.  A gel will be applied to your  chest.  A wand-like tool (transducer) will be moved over your chest. The gel will help to transmit the sound waves from the transducer.  The sound waves will harmlessly bounce off of your heart to allow the heart images to be captured in real-time motion. The images will be recorded on a computer. The procedure may vary among health care providers and hospitals. What happens after the  procedure?  You may return to your normal, everyday life, including diet, activities, and medicines, unless your health care provider tells you not to do that. Summary  An echocardiogram is a procedure that uses painless sound waves (ultrasound) to produce an image of the heart.  Images from an echocardiogram can provide important information about the size and shape of your heart, heart muscle function, heart valve function, and fluid buildup around your heart.  You do not need to do anything to prepare before this procedure. You may eat and drink normally.  After the echocardiogram is completed, you may return to your normal, everyday life, unless your health care provider tells you not to do that. This information is not intended to replace advice given to you by your health care provider. Make sure you discuss any questions you have with your health care provider. Document Released: 01/26/2000 Document Revised: 03/02/2016 Document Reviewed: 03/02/2016 Elsevier Interactive Patient Education  2019 [employer].

## 2018-05-21 NOTE — Telephone Encounter (Signed)
TELEPHONE CALL NOTE  Joel [name] has been deemed a candidate for a follow-up tele-health visit to limit community exposure during the Covid-19 pandemic. I spoke with the patient via phone to ensure availability of phone/video source, confirm preferred email & phone number, discuss instructions and expectations, and review consent.   I reminded Joel [name] to be prepared with any vital sign and/or heart rhythm information that could potentially be obtained via home monitoring, at the time of his visit.  Finally, I reminded Joel [name] to expect an e-mail containing a link for their video-based visit approximately 15 minutes before his visit, or alternatively, a phone call at the time of his visit if his visit is planned to be a phone encounter.  Did the patient verbally consent to treatment as below? YES  Joel [name], CMA 05/21/2018 1:20 PM  CONSENT FOR TELE-HEALTH VISIT - PLEASE REVIEW  I hereby voluntarily request, consent and authorize [facility] and its employed or contracted physicians, physician assistants, nurse practitioners or other licensed health care professionals (the Practitioner), to provide me with telemedicine health care services (the "Services") as deemed necessary by the treating Practitioner. I acknowledge and consent to receive the Services by the Practitioner via telemedicine. I understand that the telemedicine visit will involve communicating with the Practitioner through telephonic communication technology and the disclosure of certain medical information by electronic transmission. I acknowledge that I have been given the opportunity to request an in-person assessment or other available alternative prior to the telemedicine visit and am voluntarily participating in the telemedicine visit.  I understand that I have the right to withhold or withdraw my consent to the use of telemedicine in the course of my care at any time, without affecting my right to  future care or treatment, and that the Practitioner or I may terminate the telemedicine visit at any time. I understand that I have the right to inspect all information obtained and/or recorded in the course of the telemedicine visit and may receive copies of available information for a reasonable fee.  I understand that some of the potential risks of receiving the Services via telemedicine include:  Marland Kitchen Delay or interruption in medical evaluation due to technological equipment failure or disruption; . Information transmitted may not be sufficient (e.g. poor resolution of images) to allow for appropriate medical decision making by the Practitioner; and/or  . In rare instances, security protocols could fail, causing a breach of personal health information.  Furthermore, I acknowledge that it is my responsibility to provide information about my medical history, conditions and care that is complete and accurate to the best of my ability. I acknowledge that Practitioner's advice, recommendations, and/or decision may be based on factors not within their control, such as incomplete or inaccurate data provided by me or lack of visual representation. I understand that the practice of medicine is not an exact science and that Practitioner makes no warranties or guarantees regarding treatment outcomes. I acknowledge that I will receive a copy of this consent concurrently upon execution via email to the email address I last provided but may also request a printed copy by calling the office of [facility].    I understand that my insurance may be billed for this visit.   I have read or had this consent read to me. . I understand the contents of this consent, which adequately explains the benefits and risks of the Services being provided via telemedicine.  Marland Kitchen  I have been provided ample opportunity to ask questions regarding this consent and the Services and have had my questions answered to my  satisfaction. . I give my informed consent for the services to be provided through the use of telemedicine in my medical care  By participating in this telemedicine visit I agree to the above.

## 2018-05-21 NOTE — Telephone Encounter (Signed)
Spoke with patient.  Verified if patient had a smart phone.  Patient confirmed that he does have a smart phone.  Explained the steps on how today's appointment will go.  Offered to do a Doxy Trial with patient but he denied and would wait until his appointment time.  He did not have any vitals at this time due to making arrangements for a death in the family.

## 2018-05-21 NOTE — Telephone Encounter (Signed)
   TELEPHONE CALL NOTE  This patient has been deemed a candidate for follow-up tele-health visit to limit community exposure during the Covid-19 pandemic. I spoke with the patient via phone to discuss instructions. The patient was advised to review the section on consent for treatment as well. The patient will receive a phone call 2-3 days prior to their E-Visit at which time consent will be verbally confirmed. A Virtual Office Visit appointment type has been scheduled for 05/25/2018 with Clarisa Kindred FNP.  ALLRED, AMANDA L, CMA 05/21/2018 1:20 PM

## 2018-05-21 NOTE — Progress Notes (Addendum)
Virtual Visit via Video Note   This visit type was conducted due to national recommendations for restrictions regarding the COVID-19 Pandemic (e.g. social distancing) in an effort to limit this patient's exposure and mitigate transmission in our community.  Due to her co-morbid illnesses, this patient is at least at moderate risk for complications without adequate follow up.  This format is felt to be most appropriate for this patient at this time.  All issues noted in this document were discussed and addressed.  A limited physical exam was performed with this format.  Please refer to the patient's chart for her consent to telehealth for [facility].    Date:  05/21/2018   ID:  Joel [name], DOB 1980-04-11, MRN [id number]  Patient Location:  [address] [place] [address]   Provider location:   [facility], [place] office  PCP:  [name], DO  Cardiologist:  [facility]  Chief Complaint: Shortness of breath, fatigue    History of Present Illness:    Joel [name] is a 38 y.o. male who presents via audio/video conferencing for a telehealth visit today.   The patient does not symptoms concerning for COVID-19 infection (fever, chills, cough, or new SHORTNESS OF BREATH).   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 He presents for follow-up of his systolic and diastolic CHF  Presented to the hospital 02/12/17/18 due to HF and HTN.  Hypertensive urgency on arrival Echo report from 02/12/17 EF of 25-30%  Aggressive  diuresis, blood pressure control Started on Coreg 12.5 mg twice daily with losartan 50 mg daily  Lasix 20 mg daily  Since discharge has seen Dr. Cherylann Ratel, CHF clinic  Since his last clinic visit reports he has been doing relatively well Has not been back in the hospital Not a good day today, Lost grandfather today, complications for MRSA Had other medical issues  Tolerating his medications well Blood pressure continues to run mildly elevated  BP: 140/90 Pulse : 98, beats per minute Weight: 332 pounds  Headaches gone Feels well, no swelling in legs or abdomen No cough or PND Good activity level Overall happy with where he is   Prior CV studies:   The following studies were reviewed today:  Echo 02/2017 Left ventricle: The cavity size was normal. There was moderate   concentric hypertrophy. Systolic function was severely reduced.   The estimated ejection fraction was in the range of 25% to 30%.   Diffuse hypokinesis. Regional wall motion abnormalities cannot be   excluded. Features are consistent with a pseudonormal left   ventricular filling pattern, with concomitant abnormal relaxation   and increased filling pressure (grade 2 diastolic dysfunction). - Mitral valve: There was mild regurgitation. - Left atrium: The atrium was mildly to moderately dilated. - Right ventricle: Systolic function was normal. - Pulmonary arteries: Systolic pressure could not be accurately   estimated.  Past Medical History:  Diagnosis Date  . Anxiety   . Chronic diastolic CHF (congestive heart failure) (HCC)   . Hyperlipidemia   . Hypertension   . Hypertensive heart disease   . Noncompliance with medications   .  Obesity   . Stroke (HCC)   . Systolic dysfunction    a. TTE 06/2014: EF 45-50%, normal wall motion, mild MR, mild AI   Past Surgical History:  Procedure Laterality Date  . NO PAST SURGERIES       No outpatient medications have been marked as taking for the 05/21/18 encounter  (Telemedicine) with Antonieta Iba, MD.     Allergies:   Bidil [isosorb dinitrate-hydralazine] and Nitroglycerin   Social History   Tobacco Use  . Smoking status: Never Smoker  . Smokeless tobacco: Never Used  Substance Use Topics  . Alcohol use: Not Currently    Frequency: Never    Comment: Very infrequently; ~2-3 drinks/year   . Drug use: No     Current Outpatient Medications on File Prior to Visit  Medication Sig Dispense Refill  . amLODipine (NORVASC) 5 MG tablet Take 1 tablet (5 mg total) by mouth daily. 90 tablet 3  . aspirin 81 MG chewable tablet Chew 1 tablet (81 mg total) by mouth daily.    Marland Kitchen atorvastatin (LIPITOR) 80 MG tablet Take 1 tablet (80 mg total) by mouth daily at 6 PM. 90 tablet 3  . BYDUREON 2 MG PEN Inject 2 mg into the skin once a week. 4 each 2  . carvedilol (COREG) 25 MG tablet Take 1 tablet (25 mg total) by mouth 2 (two) times daily with a meal. 180 tablet 3  . esomeprazole (NEXIUM) 20 MG capsule Take 20 mg by mouth daily at 12 noon.    . furosemide (LASIX) 20 MG tablet Take 1 tablet (20 mg total) by mouth as needed. 90 tablet 3  . insulin aspart (NOVOLOG) 100 UNIT/ML injection Inject 5 Units into the skin 3 (three) times daily with meals. 10 mL 11  . insulin detemir (LEVEMIR) 100 UNIT/ML injection Inject 0.45 mLs (45 Units total) into the skin 2 (two) times daily. 20 mL 2  . sacubitril-valsartan (ENTRESTO) 97-103 MG Take 1 tablet by mouth 2 (two) times daily. 180 tablet 3  . spironolactone (ALDACTONE) 25 MG tablet Take 1 tablet (25 mg total) by mouth daily. 90 tablet 3   No current facility-administered medications on file prior to visit.      Family Hx: The patient's family history includes CAD in his father, paternal grandfather, and paternal grandmother; Diabetes in his maternal grandfather, maternal grandmother, mother, and paternal uncle; Heart disease in his father; Hyperlipidemia in his mother; Hypertension in his mother; Stroke in his father.   ROS:   Please see the history of present illness.    Review of Systems  Constitutional: Positive for malaise/fatigue.  Respiratory: Negative.   Cardiovascular: Negative.   Gastrointestinal: Negative.   Musculoskeletal: Negative.   Neurological: Negative.   Psychiatric/Behavioral: Negative.   All other systems reviewed and are negative.    Labs/Other Tests and Data Reviewed:    Recent Labs: 01/28/2018: BUN 25; Creatinine, Ser 1.77; Potassium 4.0; Sodium 135   Recent Lipid Panel Lab Results  Component Value Date/Time   CHOL 435 (H) 02/13/2017 04:16 AM   CHOL 361 (H) 06/11/2014 12:32 PM   TRIG 267 (H) 02/13/2017 04:16 AM   TRIG 246 (H) 06/11/2014 12:32 PM   HDL 30 (L) 02/13/2017 04:16 AM   HDL 41 06/11/2014 12:32 PM   CHOLHDL 14.5 02/13/2017 04:16 AM   LDLCALC 352 (H) 02/13/2017 04:16 AM   LDLCALC 271 (H) 06/11/2014 12:32 PM    Wt Readings from Last 3 Encounters:  03/22/18 (!) 332 lb  8 oz (150.8 kg)  02/10/18 (!) 329 lb 4 oz (149.3 kg)  12/19/17 (!) 322 lb 9.6 oz (146.3 kg)     Exam:    Vital Signs: Vital signs may also be detailed in the HPI There were no vitals taken for this visit.  Wt Readings from Last 3 Encounters:  03/22/18 (!) 332 lb 8 oz (150.8 kg)  02/10/18 (!) 329 lb 4 oz (149.3 kg)  12/19/17 (!) 322 lb 9.6 oz (146.3 kg)   Temp Readings from Last 3 Encounters:  03/22/18 98.6 F (37 C) (Oral)  12/19/17 98.4 F (36.9 C) (Oral)  03/03/17 98.4 F (36.9 C) (Oral)   BP Readings from Last 3 Encounters:  03/22/18 134/87  02/10/18 (!) 144/97  12/19/17 131/87   Pulse Readings from Last 3 Encounters:  03/22/18 98  02/10/18 (!) 114  12/19/17 87    Today's numbers BP: 140/90 Pulse : 98, beats per minute Weight: 332 pounds Respiratory rate 16   Well nourished, well developed male in no acute distress. Constitutional:  oriented to person, place, and time. No distress.  Head: Normocephalic and atraumatic.  Eyes:  no discharge. No scleral icterus.   Neck: Normal range of motion. Neck supple.  Pulmonary/Chest: No audible wheezing, no distress, appears comfortable Musculoskeletal: Grossly normal range of motion.  no  tenderness or deformity.  Neurological:   Coordination normal. Full exam not performed Skin:  No rash Psychiatric:  normal mood and affect. behavior is normal. Thought content normal.   ASSESSMENT & PLAN:    Dilated cardiomyopathy (HCC) - Plan: ECHOCARDIOGRAM COMPLETE No medication changes made  We have recommended repeat echocardiogram in several months time after viral outbreak has resolved Prior echocardiogram January 2019 EF has fluctuated 45 to 50% then dropped 25 to 30% No study since that time  systolic CHF, chronic (HCC) -  Appears relatively euvolemic Suggested he consider having lab work when he does his echocardiogram  Essential hypertension Blood pressure running high Could increase amlodipine or add Imdur He was not particularly interested with adding anymore medication at this time Recommend he continue to monitor pressure  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  The importance of social distancing was discussed today.  Patient Risk:   After full review of this patients clinical status, I feel that they are at least moderate risk at this time.  Time:   Today, I have spent 25 minutes with the patient with telehealth technology discussing the cardiac and medical problems/diagnoses detailed above   Discussed hypertensive heart disease management,  importance of blood pressure control and diabetes control   Medication Adjustments/Labs and Tests Ordered: Current medicines are reviewed at length with the patient today.  Concerns regarding medicines are outlined above.   Tests Ordered: No tests ordered   Medication Changes: No changes made   Disposition: Follow-up in 12 months Followed by heart failure clinic primary care and endocrine   Signed, Julien Nordmann, MD  05/21/2018 3:48 PM    [facility] Office [address], [place], [place]  [address]  

## 2018-05-25 ENCOUNTER — Other Ambulatory Visit: Payer: Self-pay

## 2018-05-25 ENCOUNTER — Encounter: Payer: Self-pay | Admitting: Family

## 2018-05-25 ENCOUNTER — Ambulatory Visit: Payer: Medicaid Other | Attending: Family | Admitting: Family

## 2018-05-25 VITALS — Wt 324.0 lb

## 2018-05-25 DIAGNOSIS — I10 Essential (primary) hypertension: Secondary | ICD-10-CM

## 2018-05-25 DIAGNOSIS — I5022 Chronic systolic (congestive) heart failure: Secondary | ICD-10-CM

## 2018-05-25 NOTE — Progress Notes (Addendum)
Virtual Visit via Telephone Note    Evaluation Performed:  Follow-up visit  This visit type was conducted due to national recommendations for restrictions regarding the COVID-19 Pandemic (e.g. social distancing).  This format is felt to be most appropriate for this patient at this time.  All issues noted in this document were discussed and addressed.  No physical exam was performed (except for noted visual exam findings with Video Visits).  Please refer to the patient's chart (MyChart message for video visits and phone note for telephone visits) for the patient's consent to telehealth for [facility]  Date:  05/25/2018   ID:  Joel [name], DOB Mar 30, 1980, MRN [id number]  Patient Location:  [address] [place] [address]   Provider location:   [facility] [address] [place], [place] [address]  PCP:  [name], DO  Cardiologist: Julien Nordmann, MD Electrophysiologist:  None   Chief Complaint:  Fatigue  History of Present Illness:    Joel [name] is a 38 y.o. male who presents via audio/video conferencing for a telehealth visit today.  Patient verified DOB and address.  The patient does not have symptoms concerning for COVID-19 infection (fever, chills, cough, or new SHORTNESS OF BREATH).  Patient reports moderate fatigue upon moderate exertion. He says that this has been chronic in nature having been present for several years. He has associated slight swelling around his left ankle but says that it's due to a fall that he had ~ 1 month ago. No edema elsewhere. He denies any difficulty sleeping, sore throat, cough, shortness of breath, chest pain, palpitations, abdominal distention or dizziness unless he stands up too quickly. Says that his weight continues to fluctuate 10 pounds which has become his normal for him.   Prior CV studies:   The following studies were reviewed today:  Echo report from 02/12/17 reviewed  and showed an EF of 25-30% along with mild MR.   Past Medical History:  Diagnosis Date  . Anxiety   . Chronic diastolic CHF (congestive heart failure) (HCC)   . Hyperlipidemia   . Hypertension   . Hypertensive heart disease   . Noncompliance with medications   . Obesity   . Stroke (HCC)   . Systolic dysfunction    a. TTE 06/2014: EF 45-50%, normal wall motion, mild MR, mild AI   Past Surgical History:  Procedure Laterality Date  . NO PAST SURGERIES       Current Meds  Medication Sig  . amLODipine (NORVASC) 5 MG tablet Take 1 tablet (5 mg total) by mouth daily.  Marland Kitchen aspirin 81 MG chewable tablet Chew 1 tablet (81 mg total) by mouth daily.  Marland Kitchen atorvastatin (LIPITOR) 80 MG tablet Take 1 tablet (80 mg total) by mouth daily at 6 PM.  . BYDUREON 2 MG PEN Inject 2 mg into the skin once a week.  . carvedilol (COREG) 25 MG tablet Take 1 tablet (25 mg total) by mouth 2 (two) times daily with a meal.  . esomeprazole (NEXIUM) 20 MG capsule Take 20 mg by mouth daily at 12 noon.  . furosemide (LASIX) 20 MG tablet Take 20 mg by mouth as needed.  . insulin aspart (NOVOLOG) 100 UNIT/ML injection Inject 5 Units into the skin 3 (three) times daily with meals.  . insulin detemir (LEVEMIR) 100 UNIT/ML injection Inject 0.45 mLs (45 Units total) into the skin 2 (two) times daily.  . sacubitril-valsartan (ENTRESTO) 97-103 MG  Take 1 tablet by mouth 2 (two) times daily.  Marland Kitchen spironolactone (ALDACTONE) 25 MG tablet Take 25 mg by mouth daily.     Allergies:   Bidil [isosorb dinitrate-hydralazine] and Nitroglycerin   Social History   Tobacco Use  . Smoking status: Never Smoker  . Smokeless tobacco: Never Used  Substance Use Topics  . Alcohol use: Not Currently    Frequency: Never    Comment: Very infrequently; ~2-3 drinks/year   . Drug use: No     Family Hx: The patient's family history includes CAD in his father, paternal grandfather, and paternal grandmother; Diabetes in his maternal grandfather,  maternal grandmother, mother, and paternal uncle; Heart disease in his father; Hyperlipidemia in his mother; Hypertension in his mother; Stroke in his father.  ROS:   Please see the history of present illness.     All other systems reviewed and are negative.   Labs/Other Tests and Data Reviewed:    Recent Labs: 01/28/2018: BUN 25; Creatinine, Ser 1.77; Potassium 4.0; Sodium 135   Recent Lipid Panel Lab Results  Component Value Date/Time   CHOL 435 (H) 02/13/2017 04:16 AM   CHOL 361 (H) 06/11/2014 12:32 PM   TRIG 267 (H) 02/13/2017 04:16 AM   TRIG 246 (H) 06/11/2014 12:32 PM   HDL 30 (L) 02/13/2017 04:16 AM   HDL 41 06/11/2014 12:32 PM   CHOLHDL 14.5 02/13/2017 04:16 AM   LDLCALC 352 (H) 02/13/2017 04:16 AM   LDLCALC 271 (H) 06/11/2014 12:32 PM    Wt Readings from Last 3 Encounters:  05/25/18 (!) 324 lb (147 kg)  03/22/18 (!) 332 lb 8 oz (150.8 kg)  02/10/18 (!) 329 lb 4 oz (149.3 kg)     Exam:    Vital Signs:  Wt (!) 324 lb (147 kg) Comment: self-reported  BMI 50.75 kg/m    Well nourished, well developed male in no  acute distress.   ASSESSMENT & PLAN:    1. Chronic heart failure with reduced ejection fraction- - NYHA class II - euvolemic based on patient's description   - not weighing daily and he was encouraged to resume daily weighing so that he can call for an overnight weight gain of >2 pounds or a weekly weight gain of >5 pounds - not adding salt and is using Mrs. Dash for seasoning - trying to remain active and has recently been helping someone do some painting  2: HTN- - not checking his BP - saw PCP Althea Charon) 03/20/2018 - BMP from 01/28/18 reviewed and showed sodium 135, potassium 4.0, creatinine 1.77 and GFR 56  3: Snoring- - patient continues to endorse snoring and says that his wife wakes him up often due to him not breathing - will place order for a sleep study to rule out sleep apnea  COVID-19 Education: The signs and symptoms of COVID-19  were discussed with the patient and how to seek care for testing (follow up with PCP or arrange E-visit).  The importance of social distancing was discussed today.  Patient Risk:   After full review of this patients clinical status, I feel that they are at least moderate risk at this time.  Time:   Today, I have spent 5 minutes with the patient with telehealth technology discussing daily weight and symptoms to call about. Patient did not have medications with him and said that "nothing has changed".     Medication Adjustments/Labs and Tests Ordered: Current medicines are reviewed at length with the patient today.  Concerns regarding  medicines are outlined above.   Tests Ordered: No orders of the defined types were placed in this encounter.  Medication Changes: No orders of the defined types were placed in this encounter.   Disposition:  Follow-up in 3 months or sooner for any questions/problems before then.  Signed, [name], FNP  05/25/2018 12:11 PM    [facility]

## 2018-05-25 NOTE — Patient Instructions (Signed)
Continue weighing daily and call for an overnight weight gain of > 2 pounds or a weekly weight gain of >5 pounds. 

## 2018-06-09 ENCOUNTER — Encounter: Payer: Self-pay | Admitting: Family

## 2018-07-23 ENCOUNTER — Encounter: Payer: Self-pay | Admitting: Internal Medicine

## 2018-07-23 ENCOUNTER — Ambulatory Visit: Payer: Medicaid Other | Attending: Internal Medicine

## 2018-07-23 DIAGNOSIS — R0683 Snoring: Secondary | ICD-10-CM | POA: Diagnosis present

## 2018-07-23 DIAGNOSIS — E669 Obesity, unspecified: Secondary | ICD-10-CM | POA: Diagnosis not present

## 2018-07-23 DIAGNOSIS — G4733 Obstructive sleep apnea (adult) (pediatric): Secondary | ICD-10-CM

## 2018-07-24 ENCOUNTER — Other Ambulatory Visit: Payer: Self-pay

## 2018-08-17 NOTE — Procedures (Signed)
[facility] [address] [place], [place] [address]  Patient Name: Joel [name] DOB: [birth year]   SLEEP STUDY INTERPRETATION  DATE OF SERVICE: July 23, 2018   SLEEP STUDY HISTORY: This patient is referred to the sleep lab for a baseline Polysomnography. Pertinent history includes a history of diagnosis of excessive daytime somnolence and snoring.  PROCEDURE: This overnight polysomnogram was performed using the Alice 5 acquisition system using the standard diagnostic protocol as outlined by the AASM. This includes 6 channels of EEG, 2 channelscannels of EOG, chin EMG, bilateral anterior tibialis EMG, nasal/oral thermister, PTAF, chest and abdominal wall movements, ECG and pulse oximetry. Apneas and Hypopneas were scored per AASM definition.  SLEEP ARCHITECHTURE: This is a baseline polysomnograph  study. The total recording time was 401.0 minutes and the patients total sleep time is noted to be 292.9 minutes. Sleep onset latency was 14.6 minutes and is within normal limits.  Stage R sleep onset latency was 267.0 minutes. Sleep maintenance efficiency was 73.1 % and is reduced.  Sleep staging expressed as a percentage of total sleep time demonstrated 9.4 % N1, 35.4 % N2 and 7.7 % N3  sleep. Stage R represents 17.6 % of total sleep time. This is decreased.  There were a total of 76 arousals  for an overall arousal index of 15.6 per hour of sleep. PLMS arousal are not noted. Arousals without respiratory events are  noted. This can contribute to sleep architechture disruption.  RESPIRATORY MONITORING:   Patient exhibits significant evidence of sleep disorderd breathing characterized by no central apneas, 33 obstructive apneas and no mixed apneas. There were 205 obstructive hypopneas and 2 RERAs. Most of the apneas/hypopneas were of obstructive variety. The total apnea hypopnea index (apneas and hypopneas per hour of sleep) is 48.7 respiratory events per hour and is  severe.  Respiratory monitoring demonstrated severe snoring through the night. There are a total of 3844 snoring episodes representing 54.2 % of sleep.   Baseline oxygen saturation during wakefulness was 98 % and during NREM sleep averaged 95 % through the night. Arterial saturation during REM sleep was 95 % through the night. There was significant  oxygen desaturation with the respiratory events. Arterial oxygen desaturation occurred of at least 4% was noted with a low saturation of 70 %. The study was performed off oxygen.  CARDIAC MONITORING:   Average heart rate is 89 during sleep with a high of 106 beats per minute. Malignant arrhythmias were not noted.    IMPRESSIONS:  --This overnight polysomnogram demonstrates presence of severe obstructive sleep apnea with an overall AHI 48.7 per hour. --The overall AHI was significantly worse  during Stage R. --There were associated was arterial oxygen desaturations noted 70% --There was no significant PLMS noted in this study. --There is severe snoring noted throughout the study.    RECOMMENDATIONS:  --CPAP titration study is indicated in this case to determine optimal response to CPAP therapy and pressures. --Nasal decongestants and antihistamines may be of help for increased upper airways resistance when present. --Weight loss through dietary and lifestyle modification is recommended in the presence of obesity. --A search for and treatment of any underlying cardiopulmonary disease is      recommended in the presence of oxygen desaturations. --Alternative treatment options if the patient is not willing to use CPAP include oral   appliances as well as surgical intervention which may help in the appropriate patient. --Clinical correlation is recommended. Please feel free to call the office for any  further  questions or assistance in the care of this patient.     Yevonne Pax, MD [facility] Pulmonary Critical Care Medicine Sleep medicine

## 2018-08-21 ENCOUNTER — Encounter: Payer: Self-pay | Admitting: Family

## 2018-08-22 ENCOUNTER — Other Ambulatory Visit: Payer: Self-pay | Admitting: Family

## 2018-08-22 DIAGNOSIS — I5022 Chronic systolic (congestive) heart failure: Secondary | ICD-10-CM

## 2018-08-22 MED ORDER — SACUBITRIL-VALSARTAN 97-103 MG PO TABS: 1.0000 | ORAL_TABLET | Freq: Two times a day (BID) | ORAL | 3 refills | Status: DC

## 2018-08-27 ENCOUNTER — Other Ambulatory Visit: Payer: Self-pay

## 2018-08-27 ENCOUNTER — Other Ambulatory Visit: Payer: Self-pay | Admitting: Family

## 2018-08-27 NOTE — Progress Notes (Signed)
Patient scheduled for sleep study on 09/03/2018 it needs COVID testing prior to study. PAT said for patient to come on 08/31/2018 for testing.

## 2018-08-28 ENCOUNTER — Other Ambulatory Visit
Admission: RE | Admit: 2018-08-28 | Discharge: 2018-08-28 | Disposition: A | Discharge status: Home / Self Care | Payer: Medicaid Other | Source: Ambulatory Visit | Attending: Family | Admitting: Family

## 2018-08-28 ENCOUNTER — Other Ambulatory Visit: Payer: Self-pay

## 2018-08-28 DIAGNOSIS — Z1159 Encounter for screening for other viral diseases: Secondary | ICD-10-CM | POA: Diagnosis present

## 2018-08-28 LAB — SARS CORONAVIRUS 2 (TAT 6-24 HRS): SARS Coronavirus 2: NEGATIVE

## 2018-08-29 ENCOUNTER — Encounter: Payer: Self-pay | Admitting: Family

## 2018-08-31 ENCOUNTER — Other Ambulatory Visit: Admission: RE | Admit: 2018-08-31 | Payer: Medicaid Other | Source: Ambulatory Visit

## 2018-08-31 ENCOUNTER — Encounter: Payer: Self-pay | Admitting: Family

## 2018-09-03 ENCOUNTER — Ambulatory Visit: Payer: Medicaid Other | Attending: Internal Medicine

## 2018-09-03 ENCOUNTER — Encounter: Payer: Self-pay | Admitting: Internal Medicine

## 2018-09-03 DIAGNOSIS — G4733 Obstructive sleep apnea (adult) (pediatric): Secondary | ICD-10-CM | POA: Diagnosis present

## 2018-09-03 DIAGNOSIS — R400 Somnolence: Secondary | ICD-10-CM | POA: Diagnosis not present

## 2018-09-04 ENCOUNTER — Other Ambulatory Visit: Payer: Self-pay

## 2018-09-04 ENCOUNTER — Encounter: Payer: Self-pay | Admitting: Family

## 2018-09-07 ENCOUNTER — Encounter: Payer: Self-pay | Admitting: Internal Medicine

## 2018-09-13 NOTE — Procedures (Signed)
[facility] [address] [place], [place] [address]  Patient Name: Joel [name] DOB: [birth year]   SLEEP STUDY INTERPRETATION  DATE OF SERVICE: 09/03/2018   SLEEP STUDY HISTORY: This patient is referred to the sleep lab for a baseline Polysomnography. Pertinent history includes a history of diagnosis of excessive daytime somnolence and snoring.  PROCEDURE: This overnight polysomnogram was performed using the Alice 5 acquisition system using the standard diagnostic protocol as outlined by the AASM. This includes 6 channels of EEG, 2 channelscannels of EOG, chin EMG, bilateral anterior tibialis EMG, nasal/oral thermister, PTAF, chest and abdominal wall movements, ECG and pulse oximetry. Apneas and Hypopneas were scored per AASM definition.  SLEEP ARCHITECHTURE: This is a baseline polysomnograph  study. The total recording time was 412.0 minutes and the patients total sleep time is noted to be 273.0 minutes. Sleep onset latency was 37.1 minutes and is prolonged.  Stage R sleep onset latency was 91.5 minutes. Sleep maintenance efficiency was 73.4 % and is decreased.  Sleep staging expressed as a percentage of total sleep time demonstrated 4.2 % N1, 45.6 % N2 and 23.1 % N3  sleep. Stage R represents 27.1 % of total sleep time. This is increased.  There were a total of 6 arousals  for an overall arousal index of 1.3 per hour of sleep. PLMS arousal were not noted. Arousals without respiratory events are  noted. This can contribute to sleep architechture disruption.  RESPIRATORY MONITORING:   Patient exhibits no significant evidence of sleep disorderd breathing characterized by 0 central apneas, 1 obstructive apneas and 0 mixed apneas. There were 0 obstructive hypopneas and 0 RERAs. Most of the apneas/hypopneas were of obstructive variety. The total apnea hypopnea index (apneas and hypopneas per hour of sleep) is 0.2 respiratory events per hour and is within normal limits.  Respiratory  monitoring demonstrated mild snoring through the night. There are a total of 831 snoring episodes representing 6.7 % of sleep.   Baseline oxygen saturation during wakefulness was 98 % and during NREM sleep averaged 95 % through the night. Arterial saturation during REM sleep was 95 % through the night. There was insignificant  oxygen desaturation with the respiratory events. Arterial oxygen desaturation occurred of at least 4% was noted with a low saturation of 91 %. The study was performed off oxygen.  CARDIAC MONITORING:   Average heart rate is 87 during sleep with a high of 100 beats per minute. Malignant arrhythmias were not noted.  CPAP titration: Patient was started on CPAP as per the sleep lab protocol.  Patient was started on a pressure of 9 cm water pressure and increased to a pressure of 16 cm water pressure.  The CPAP was adequate to control the respiratory events.  And it appears that the optimal CPAP pressure to eliminate respiratory events as well as snoring was a CPAP of 15 cm water pressure.  On this pressure the patient was recorded for 112 minutes and had 97% sleep efficiency along with stage R sleep was noted.  The apnea hypopnea index was 0 and there was no significant desaturation below 92%.  Patient also tolerated it quite well.  IMPRESSIONS:  --This overnight polysomnogram demonstrates no significant sleep apnea with an overall AHI 0.2 per hour. --The overall AHI was no worse  during Stage R. --There were associated no significant arterial oxygen desaturations noted in this study --There was no significant PLMS noted in this study. --There is mild snoring noted throughout the study.  RECOMMENDATIONS:  --CPAP titration study is adequate to control the patient's respiratory events at a pressure of 15 cm water pressure. --Nasal decongestants and antihistamines may be of help for increased upper airways resistance when present. --Weight loss through dietary and lifestyle  modification is recommended in the presence of obesity. --A search for and treatment of any underlying cardiopulmonary disease is      recommended in the presence of oxygen desaturations. --Alternative treatment options if the patient is not willing to use CPAP include oral   appliances as well as surgical intervention which may help in the appropriate patient. --Clinical correlation is recommended. Please feel free to call the office for any further  questions or assistance in the care of this patient.     Allyne Gee, MD [facility]monary Critical Care Medicine Sleep medicine

## 2018-09-13 NOTE — Progress Notes (Signed)
REF  Patient ID: Joel [name], male    DOB: 01/12/1981, 38 y.o.   MRN: [id number]  HPI  Mr Sharon SellerLloyd is a 38 y/o male with a history of CKD, DM, HTN and chronic heart failure.   Echo report from 02/12/17 reviewed and showed an EF of 25-30% along with mild MR. EF has declined from 2016.  Has not been admitted or been in the ED in the last 6 months.  He presents today for a follow-up visit with a chief complaint of minimal shortness of breath upon moderate exertion. He describes this as chronic in nature having been present for several years. He feels like his breathing has worsened some as his weight has increased. He has associated fatigue, light-headedness and gradual weight gain along with this. He denies any difficulty sleeping, abdominal distention, palpitations, pedal edema, chest pain or cough.   Due to COVID-19, he is no longer working so admits that he's been sitting around home "eating anything and everything". His wife is now buying groceries daily and getting just what they need to cook for that night so that there isn't extra food sitting around.   Past Medical History:  Diagnosis Date  . Anxiety   . Chronic diastolic CHF (congestive heart failure) (HCC)   . Hyperlipidemia   . Hypertension   . Hypertensive heart disease   . Noncompliance with medications   . Obesity   . Stroke (HCC)   . Systolic dysfunction    a. TTE 06/2014: EF 45-50%, normal wall motion, mild MR, mild AI   Past Surgical History:  Procedure Laterality Date  . NO PAST SURGERIES     Family History  Problem Relation Age of Onset  . Hypertension Mother   . Hyperlipidemia Mother   . Diabetes Mother   . CAD Father        a. stenting in his early 2050s  . Heart disease Father   . Stroke Father   . Diabetes Maternal Grandmother   . Diabetes Maternal Grandfather   . CAD Paternal Grandmother   . CAD Paternal Grandfather   . Diabetes Paternal Uncle    Social History   Tobacco Use  . Smoking status: Never  Smoker  . Smokeless tobacco: Never Used  Substance Use Topics  . Alcohol use: Not Currently    Frequency: Never    Comment: Very infrequently; ~2-3 drinks/year    Allergies  Allergen Reactions  . Bidil [Isosorb Dinitrate-Hydralazine] Other (See Comments)    Severe headache  . Nitroglycerin Other (See Comments)    Causes severe headaches   Prior to Admission medications   Medication Sig Start Date End Date Taking? Authorizing Provider  amLODipine (NORVASC) 5 MG tablet Take 1 tablet (5 mg total) by mouth daily. 02/23/18  Yes [name],  A, FNP  aspirin 81 MG chewable tablet Chew 1 tablet (81 mg total) by mouth daily. 10/13/14  Yes Milagros LollSudini, Srikar, MD  atorvastatin (LIPITOR) 80 MG tablet Take 1 tablet (80 mg total) by mouth daily at 6 PM. 02/23/18  Yes ,  A, FNP  BYDUREON 2 MG PEN Inject 2 mg into the skin once a week. 01/02/18  Yes Karamalegos, Netta NeatAlexander J, DO  carvedilol (COREG) 25 MG tablet Take 1 tablet (25 mg total) by mouth 2 (two) times daily with a meal. 02/23/18  Yes ,  A, FNP  esomeprazole (NEXIUM) 20 MG capsule Take 20 mg by mouth daily at 12 noon.   Yes [provider]  furosemide (LASIX)  20 MG tablet Take 20 mg by mouth as needed.   Yes [provider]  insulin aspart (NOVOLOG) 100 UNIT/ML injection Inject 5 Units into the skin 3 (three) times daily with meals. 02/14/17  Yes [name], MD  insulin detemir (LEVEMIR) 100 UNIT/ML injection Inject 0.45 mLs (45 Units total) into the skin 2 (two) times daily. 05/08/18  Yes Karamalegos, Netta Neat, DO  sacubitril-valsartan (ENTRESTO) 97-103 MG Take 1 tablet by mouth 2 (two) times daily. 08/22/18  Yes , [name], FNP  spironolactone (ALDACTONE) 25 MG tablet Take 25 mg by mouth daily.   Yes [provider]    Review of Systems  Constitutional: Positive for fatigue (at times). Negative for appetite change.  HENT: Negative for congestion, postnasal drip and sore throat.   Eyes:  Positive for visual disturbance (blurry vision).  Respiratory: Positive for shortness of breath. Negative for cough and chest tightness.   Cardiovascular: Negative for chest pain, palpitations and leg swelling.  Gastrointestinal: Negative for abdominal distention and abdominal pain.  Endocrine: Negative.   Genitourinary: Negative.   Musculoskeletal: Negative for back pain and neck pain.  Skin: Negative.   Allergic/Immunologic: Negative.   Neurological: Positive for light-headedness (at times) and headaches (rarely). Negative for dizziness.  Hematological: Negative for adenopathy. Does not bruise/bleed easily.  Psychiatric/Behavioral: Negative for dysphoric mood and sleep disturbance. The patient is not nervous/anxious.    Vitals:   09/14/18 1358  BP: (!) 146/77  Pulse: 98  Resp: 18  SpO2: 99%  Weight: (!) 340 lb 8 oz (154.4 kg)  Height: 5\' 7"  (1.702 m)   Wt Readings from Last 3 Encounters:  09/14/18 (!) 340 lb 8 oz (154.4 kg)  05/25/18 (!) 324 lb (147 kg)  03/22/18 (!) 332 lb 8 oz (150.8 kg)   Lab Results  Component Value Date   CREATININE 1.77 (H) 01/28/2018   CREATININE 2.14 (H) 12/15/2017   CREATININE 1.71 (H) 11/12/2017    Physical Exam  Constitutional: He is oriented to person, place, and time. He appears well-developed and well-nourished.  HENT:  Head: Normocephalic and atraumatic.  Neck: Normal range of motion. Neck supple. No JVD present.  Cardiovascular: Normal rate and regular rhythm.  Pulmonary/Chest: Effort normal. He has no wheezes. He has no rales.  Abdominal: Soft. He exhibits no distension. There is no abdominal tenderness.  Musculoskeletal:        General: No tenderness or edema.  Neurological: He is alert and oriented to person, place, and time.  Skin: Skin is warm and dry.  Psychiatric: He has a normal mood and affect. His behavior is normal. Thought content normal.  Nursing note and vitals reviewed.   Assessment & Plan:  1: Chronic heart failure  with reduced ejection fraction- - NYHA class II - euvolemic today - weighing daily and says that his weight has gradually risen. Reminded to call for an overnight weight gain of >2 pounds or a weekly weight gain of >5 pounds - weight up 11 pounds since last here 7 months ago; he admits that since he's no longer, he's been sitting around eating - discussed ways to increase his activity; will make referral for cardiac rehab - using [employer] for seasoning & has been reading food labels for sodium content. Encouraged to keep daily sodium intake to 2000mg  daily - unable to tolerate bidil as it gave him a headache - BNP on 02/12/17 was 309.0 - had telemedicine visit with cardiologist Mariah Milling) 05/21/2018 - may need to consider corlanor  if he remains tachycardic - 2nd sleep study has been done  2: HTN- - BP mildly elevated today but his weight is also up - BMP from 01/28/18 was reviewed and showed sodium 135, potassium 4.0, creatinine 1.77 and GFR 56 - saw PCP Parks Ranger) 03/20/2018  3: Diabetes- - glucose at home was 117 this morning - A1c on 12/15/17 was 13.3% - saw endocrinology Honor Junes) 02/17/2018  Patient did not bring his medications nor a list. Each medication was verbally reviewed with the patient and he was encouraged to bring the bottles to every visit to confirm accuracy of list.  Return in 3 months or sooner for any questions/problems before then

## 2018-09-14 ENCOUNTER — Ambulatory Visit: Payer: Medicaid Other | Attending: Family | Admitting: Family

## 2018-09-14 ENCOUNTER — Other Ambulatory Visit: Payer: Self-pay

## 2018-09-14 ENCOUNTER — Encounter: Payer: Self-pay | Admitting: Family

## 2018-09-14 VITALS — BP 146/77 | HR 98 | Resp 18 | Ht 67.0 in | Wt 340.5 lb

## 2018-09-14 DIAGNOSIS — I130 Hypertensive heart and chronic kidney disease with heart failure and stage 1 through stage 4 chronic kidney disease, or unspecified chronic kidney disease: Secondary | ICD-10-CM | POA: Insufficient documentation

## 2018-09-14 DIAGNOSIS — E785 Hyperlipidemia, unspecified: Secondary | ICD-10-CM | POA: Diagnosis not present

## 2018-09-14 DIAGNOSIS — I5042 Chronic combined systolic (congestive) and diastolic (congestive) heart failure: Secondary | ICD-10-CM | POA: Diagnosis not present

## 2018-09-14 DIAGNOSIS — E669 Obesity, unspecified: Secondary | ICD-10-CM | POA: Insufficient documentation

## 2018-09-14 DIAGNOSIS — N189 Chronic kidney disease, unspecified: Secondary | ICD-10-CM | POA: Insufficient documentation

## 2018-09-14 DIAGNOSIS — Z794 Long term (current) use of insulin: Secondary | ICD-10-CM | POA: Diagnosis not present

## 2018-09-14 DIAGNOSIS — I10 Essential (primary) hypertension: Secondary | ICD-10-CM

## 2018-09-14 DIAGNOSIS — Z6841 Body Mass Index (BMI) 40.0 and over, adult: Secondary | ICD-10-CM | POA: Insufficient documentation

## 2018-09-14 DIAGNOSIS — Z8249 Family history of ischemic heart disease and other diseases of the circulatory system: Secondary | ICD-10-CM | POA: Insufficient documentation

## 2018-09-14 DIAGNOSIS — Z9114 Patient's other noncompliance with medication regimen: Secondary | ICD-10-CM | POA: Diagnosis not present

## 2018-09-14 DIAGNOSIS — N183 Chronic kidney disease, stage 3 unspecified: Secondary | ICD-10-CM

## 2018-09-14 DIAGNOSIS — E1122 Type 2 diabetes mellitus with diabetic chronic kidney disease: Secondary | ICD-10-CM | POA: Diagnosis present

## 2018-09-14 DIAGNOSIS — I5022 Chronic systolic (congestive) heart failure: Secondary | ICD-10-CM

## 2018-09-14 DIAGNOSIS — Z7982 Long term (current) use of aspirin: Secondary | ICD-10-CM | POA: Diagnosis not present

## 2018-09-14 DIAGNOSIS — Z888 Allergy status to other drugs, medicaments and biological substances status: Secondary | ICD-10-CM | POA: Diagnosis not present

## 2018-09-14 DIAGNOSIS — Z8349 Family history of other endocrine, nutritional and metabolic diseases: Secondary | ICD-10-CM | POA: Insufficient documentation

## 2018-09-14 DIAGNOSIS — Z8673 Personal history of transient ischemic attack (TIA), and cerebral infarction without residual deficits: Secondary | ICD-10-CM | POA: Diagnosis not present

## 2018-09-14 DIAGNOSIS — Z79899 Other long term (current) drug therapy: Secondary | ICD-10-CM | POA: Insufficient documentation

## 2018-09-14 DIAGNOSIS — Z833 Family history of diabetes mellitus: Secondary | ICD-10-CM | POA: Diagnosis not present

## 2018-09-14 DIAGNOSIS — E1022 Type 1 diabetes mellitus with diabetic chronic kidney disease: Secondary | ICD-10-CM

## 2018-09-14 NOTE — Patient Instructions (Signed)
Continue weighing daily and call for an overnight weight gain of > 2 pounds or a weekly weight gain of >5 pounds. 

## 2018-09-15 ENCOUNTER — Other Ambulatory Visit: Payer: Self-pay | Admitting: *Deleted

## 2018-09-15 DIAGNOSIS — I5022 Chronic systolic (congestive) heart failure: Secondary | ICD-10-CM

## 2018-10-07 ENCOUNTER — Telehealth: Payer: Self-pay | Admitting: Cardiovascular Disease

## 2018-10-07 ENCOUNTER — Encounter: Payer: Self-pay | Admitting: Internal Medicine

## 2018-10-07 NOTE — Telephone Encounter (Signed)
3 attempts to schedule Echo .   Removing order from wq.  ° °

## 2018-11-13 ENCOUNTER — Other Ambulatory Visit: Payer: Self-pay

## 2018-11-13 ENCOUNTER — Encounter: Payer: Medicaid Other | Attending: Cardiovascular Disease | Admitting: *Deleted

## 2018-11-13 DIAGNOSIS — I5022 Chronic systolic (congestive) heart failure: Secondary | ICD-10-CM | POA: Insufficient documentation

## 2018-11-13 NOTE — Progress Notes (Signed)
Virtual initial orientation completed. EP/RD orientation scheduled for 10/8 at 11am

## 2018-11-19 ENCOUNTER — Ambulatory Visit: Payer: Medicaid Other

## 2018-11-30 ENCOUNTER — Other Ambulatory Visit: Payer: Self-pay

## 2018-11-30 VITALS — Ht 68.0 in | Wt 335.8 lb

## 2018-11-30 DIAGNOSIS — I5022 Chronic systolic (congestive) heart failure: Secondary | ICD-10-CM

## 2018-11-30 NOTE — Progress Notes (Addendum)
Cardiac Individual Treatment Plan  Patient Details  Name: Joel [name] MRN: [id number] Date of Birth: [birth year] Referring Provider:     Cardiac Rehab from 11/30/2018 in [facility] Cardiac [facility] Pulmonary Rehab  Referring Provider  Gollan      Initial Encounter Date:    Cardiac Rehab from 11/30/2018 in [facility] Cardiac and Pulmonary Rehab  Date  11/30/18      Visit Diagnosis: Heart failure, chronic systolic ([place])  Patient's Home Medications on Admission:  Current Outpatient Medications:  .  amLODipine (NORVASC) 5 MG tablet, Take 1 tablet (5 mg total) by mouth daily., Disp: 90 tablet, Rfl: 3 .  aspirin 81 MG chewable tablet, Chew 1 tablet (81 mg total) by mouth daily., Disp: , Rfl:  .  atorvastatin (LIPITOR) 80 MG tablet, Take 1 tablet (80 mg total) by mouth daily at 6 PM., Disp: 90 tablet, Rfl: 3 .  carvedilol (COREG) 25 MG tablet, Take 1 tablet (25 mg total) by mouth 2 (two) times daily with a meal., Disp: 180 tablet, Rfl: 3 .  empagliflozin (JARDIANCE) 25 MG TABS tablet, Take by mouth., Disp: , Rfl:  .  esomeprazole (NEXIUM) 20 MG capsule, Take 20 mg by mouth daily at 12 noon., Disp: , Rfl:  .  furosemide (LASIX) 20 MG tablet, Take 20 mg by mouth as needed., Disp: , Rfl:  .  HUMALOG 100 UNIT/ML injection, 17 units at breakfast 13 units at lunch 25 units at dinner, Disp: , Rfl:  .  insulin detemir (LEVEMIR) 100 UNIT/ML injection, Inject 0.45 mLs (45 Units total) into the skin 2 (two) times daily., Disp: 20 mL, Rfl: 2 .  liraglutide (VICTOZA) 18 MG/3ML SOPN, Inject into the skin., Disp: , Rfl:  .  sacubitril-valsartan (ENTRESTO) 97-103 MG, Take 1 tablet by mouth 2 (two) times daily., Disp: 180 tablet, Rfl: 3 .  spironolactone (ALDACTONE) 25 MG tablet, Take 25 mg by mouth daily., Disp: , Rfl:  .  BYDUREON 2 MG PEN, Inject 2 mg into the skin once a week. (Patient not taking: Reported on 11/13/2018), Disp: 4 each, Rfl: 2 .  insulin aspart (NOVOLOG) 100 UNIT/ML injection, Inject 5 Units into the  skin 3 (three) times daily with meals. (Patient not taking: Reported on 11/30/2018), Disp: 10 mL, Rfl: 11  Past Medical History: Past Medical History:  Diagnosis Date  . Anxiety   . Chronic diastolic CHF (congestive heart failure) ([place])   . Hyperlipidemia   . Hypertension   . Hypertensive heart disease   . Noncompliance with medications   . Obesity   . Stroke ([place])   . Systolic dysfunction    a. TTE 06/2014: EF 45-50%, normal wall motion, mild MR, mild AI    Tobacco Use: Social History   Tobacco Use  Smoking Status Never Smoker  Smokeless Tobacco Never Used    Labs: Recent Review Flowsheet Data    Labs for ITP Cardiac and Pulmonary Rehab Latest Ref Rng & Units 06/11/2014 10/11/2014 02/12/2017 02/13/2017 12/15/2017   Cholestrol 0 - 200 mg/dL 361(H) - - 435(H) -   LDLCALC 0 - 99 mg/dL 271(H) - - 352(H) -   HDL >40 mg/dL 41 - - 30(L) -   Trlycerides <150 mg/dL 246(H) - - 267(H) -   Hemoglobin A1c 4.8 - 5.6 % - 7.3(H) 14.7(H) - 13.3(H)       Exercise Target Goals: Exercise Program Goal: Individual exercise prescription set using results from initial 6 min walk test and THRR while considering  patient's activity barriers  and safety.   Exercise Prescription Goal: Initial exercise prescription builds to 30-45 minutes a day of aerobic activity, 2-3 days per week.  Home exercise guidelines will be given to patient during program as part of exercise prescription that the participant will acknowledge.  Activity Barriers & Risk Stratification: Activity Barriers & Cardiac Risk Stratification - 11/13/18 1313      Activity Barriers & Cardiac Risk Stratification   Activity Barriers  None    Cardiac Risk Stratification  High       6 Minute Walk: 6 Minute Walk    Row Name 11/30/18 1431         6 Minute Walk   Phase  Initial     Distance  1120 feet     Walk Time  6 minutes     # of Rest Breaks  0     MPH  2.12     METS  3.1     RPE  9     Perceived Dyspnea   1     VO2 Peak   10.8     Symptoms  No     Resting HR  97 bpm     Resting BP  124/84     Resting Oxygen Saturation   97 %     Exercise Oxygen Saturation  during 6 min walk  99 %     Max Ex. HR  114 bpm     Max Ex. BP  126/88     2 Minute Post BP  122/84        Oxygen Initial Assessment:   Oxygen Re-Evaluation:   Oxygen Discharge (Final Oxygen Re-Evaluation):   Initial Exercise Prescription: Initial Exercise Prescription - 11/30/18 1400      Date of Initial Exercise RX and Referring Provider   Date  11/30/18    Referring Provider  Gollan      Treadmill   MPH  2.1    Grade  1.5    Minutes  15    METs  3      Recumbant Bike   Level  3    RPM  60    Watts  53    Minutes  15    METs  3      NuStep   Level  4    SPM  80    Minutes  15    METs  3      Recumbant Elliptical   Level  2    RPM  50    Minutes  15    METs  3      REL-XR   Level  3    Speed  50    Minutes  15    METs  3      T5 Nustep   Level  2    SPM  80    Minutes  15    METs  3      Prescription Details   Frequency (times per week)  3      Intensity   THRR 40-80% of Max Heartrate  131-166    Ratings of Perceived Exertion  11-13    Perceived Dyspnea  0-4      Resistance Training   Training Prescription  Yes    Weight  3 lb    Reps  10-15       Perform Capillary Blood Glucose checks as needed.  Exercise Prescription Changes: Exercise Prescription Changes    Row  Name 11/30/18 1400             Response to Exercise   Blood Pressure (Admit)  124/84       Blood Pressure (Exercise)  126/88       Blood Pressure (Exit)  122/84       Heart Rate (Admit)  97 bpm       Heart Rate (Exercise)  114 bpm       Heart Rate (Exit)  99 bpm       Oxygen Saturation (Admit)  97 %       Oxygen Saturation (Exercise)  99 %       Rating of Perceived Exertion (Exercise)  9       Perceived Dyspnea (Exercise)  1       Symptoms  none          Exercise Comments:   Exercise Goals and Review:   Exercise  Goals Re-Evaluation :   Discharge Exercise Prescription (Final Exercise Prescription Changes): Exercise Prescription Changes - 11/30/18 1400      Response to Exercise   Blood Pressure (Admit)  124/84    Blood Pressure (Exercise)  126/88    Blood Pressure (Exit)  122/84    Heart Rate (Admit)  97 bpm    Heart Rate (Exercise)  114 bpm    Heart Rate (Exit)  99 bpm    Oxygen Saturation (Admit)  97 %    Oxygen Saturation (Exercise)  99 %    Rating of Perceived Exertion (Exercise)  9    Perceived Dyspnea (Exercise)  1    Symptoms  none       Nutrition:  Target Goals: Understanding of nutrition guidelines, daily intake of sodium <1560m, cholesterol <2069m calories 30% from fat and 7% or less from saturated fats, daily to have 5 or more servings of fruits and vegetables.  Biometrics:    Nutrition Therapy Plan and Nutrition Goals:   Nutrition Assessments:   Nutrition Goals Re-Evaluation:   Nutrition Goals Discharge (Final Nutrition Goals Re-Evaluation):   Psychosocial: Target Goals: Acknowledge presence or absence of significant depression and/or stress, maximize coping skills, provide positive support system. Participant is able to verbalize types and ability to use techniques and skills needed for reducing stress and depression.   Initial Review & Psychosocial Screening: Initial Psych Review & Screening - 11/13/18 1311      Initial Review   Current issues with  Current Stress Concerns    Source of Stress Concerns  Chronic Illness;Unable to participate in former interests or hobbies;Unable to perform yard/household activities    Comments  BrYagos struggling with the issues related to his HF, like SOB and fatigue. He has a great support system and his wife helps him with managing his medications. He is looking forward to starting HT so he can gain back some control in his life      FaMitchell Yes      Barriers   Psychosocial barriers to  participate in program  There are no identifiable barriers or psychosocial needs.;The patient should benefit from training in stress management and relaxation.      Screening Interventions   Interventions  Encouraged to exercise;Provide feedback about the scores to participant;To provide support and resources with identified psychosocial needs    Expected Outcomes  Short Term goal: Utilizing psychosocial counselor, staff and physician to assist with identification of specific Stressors or current issues interfering with healing process. Setting  desired goal for each stressor or current issue identified.;Long Term Goal: Stressors or current issues are controlled or eliminated.;Long Term goal: The participant improves quality of Life and PHQ9 Scores as seen by post scores and/or verbalization of changes;Short Term goal: Identification and review with participant of any Quality of Life or Depression concerns found by scoring the questionnaire.       Quality of Life Scores:   Scores of 19 and below usually indicate a poorer quality of life in these areas.  A difference of  2-3 points is a clinically meaningful difference.  A difference of 2-3 points in the total score of the Quality of Life Index has been associated with significant improvement in overall quality of life, self-image, physical symptoms, and general health in studies assessing change in quality of life.  PHQ-9: Recent Review Flowsheet Data    Depression screen [facility] 2/9 03/22/2018 12/19/2017 12/15/2017 11/12/2017 09/22/2017   Decreased Interest 3 0 0 0 0   Down, Depressed, Hopeless 2 0 0 1 0   PHQ - 2 Score 5 0 0 1 0   Altered sleeping 1 0 - - -   Tired, decreased energy 3 0 - - -   Change in appetite 3 0 - - -   Feeling bad or failure about yourself  3 0 - - -   Trouble concentrating 1 0 - - -   Moving slowly or fidgety/restless 2 0 - - -   Suicidal thoughts 2 0 - - -   PHQ-9 Score 20 0 - - -   Difficult doing work/chores Extremely  dIfficult Not difficult at all - - -     Interpretation of Total Score  Total Score Depression Severity:  1-4 = Minimal depression, 5-9 = Mild depression, 10-14 = Moderate depression, 15-19 = Moderately severe depression, 20-27 = Severe depression   Psychosocial Evaluation and Intervention: Psychosocial Evaluation - 11/13/18 1318      Psychosocial Evaluation & Interventions   Comments  Rufus is struggling with the issues related to his HF, like SOB and fatigue. He has a great support system and his wife helps him with managing his medications. He is looking forward to starting HT so he can gain back some control in his life    Expected Outcomes  Short: Attend [facility] to gain confidence in exercising and feel more "like himself". Long: develop self care habits.       Psychosocial Re-Evaluation:   Psychosocial Discharge (Final Psychosocial Re-Evaluation):   Vocational Rehabilitation: Provide vocational rehab assistance to qualifying candidates.   Vocational Rehab Evaluation & Intervention: Vocational Rehab - 11/13/18 1312      Initial Vocational Rehab Evaluation & Intervention   Assessment shows need for Vocational Rehabilitation  No       Education: Education Goals: Education classes will be provided on a variety of topics geared toward better understanding of heart health and risk factor modification. Participant will state understanding/return demonstration of topics presented as noted by education test scores.  Learning Barriers/Preferences: Learning Barriers/Preferences - 11/13/18 1311      Learning Barriers/Preferences   Learning Barriers  None    Learning Preferences  None       Education Topics:  AED/CPR: - Group verbal and written instruction with the use of models to demonstrate the basic use of the AED with the basic ABC's of resuscitation.   General Nutrition Guidelines/Fats and Fiber: -Group instruction provided by verbal, written material, models and  posters to present the general  guidelines for heart healthy nutrition. Gives an explanation and review of dietary fats and fiber.   Controlling Sodium/Reading Food Labels: -Group verbal and written material supporting the discussion of sodium use in heart healthy nutrition. Review and explanation with models, verbal and written materials for utilization of the food label.   Exercise Physiology & General Exercise Guidelines: - Group verbal and written instruction with models to review the exercise physiology of the cardiovascular system and associated critical values. Provides general exercise guidelines with specific guidelines to those with heart or lung disease.    Aerobic Exercise & Resistance Training: - Gives group verbal and written instruction on the various components of exercise. Focuses on aerobic and resistive training programs and the benefits of this training and how to safely progress through these programs..   Flexibility, Balance, Mind/Body Relaxation: Provides group verbal/written instruction on the benefits of flexibility and balance training, including mind/body exercise modes such as yoga, pilates and tai chi.  Demonstration and skill practice provided.   Stress and Anxiety: - Provides group verbal and written instruction about the health risks of elevated stress and causes of high stress.  Discuss the correlation between heart/lung disease and anxiety and treatment options. Review healthy ways to manage with stress and anxiety.   Depression: - Provides group verbal and written instruction on the correlation between heart/lung disease and depressed mood, treatment options, and the stigmas associated with seeking treatment.   Anatomy & Physiology of the Heart: - Group verbal and written instruction and models provide basic cardiac anatomy and physiology, with the coronary electrical and arterial systems. Review of Valvular disease and Heart Failure   Cardiac  Procedures: - Group verbal and written instruction to review commonly prescribed medications for heart disease. Reviews the medication, class of the drug, and side effects. Includes the steps to properly store meds and maintain the prescription regimen. (beta blockers and nitrates)   Cardiac Medications I: - Group verbal and written instruction to review commonly prescribed medications for heart disease. Reviews the medication, class of the drug, and side effects. Includes the steps to properly store meds and maintain the prescription regimen.   Cardiac Medications II: -Group verbal and written instruction to review commonly prescribed medications for heart disease. Reviews the medication, class of the drug, and side effects. (all other drug classes)    Go Sex-Intimacy & Heart Disease, Get SMART - Goal Setting: - Group verbal and written instruction through game format to discuss heart disease and the return to sexual intimacy. Provides group verbal and written material to discuss and apply goal setting through the application of the S.M.A.R.T. Method.   Other Matters of the Heart: - Provides group verbal, written materials and models to describe Stable Angina and Peripheral Artery. Includes description of the disease process and treatment options available to the cardiac patient.   Exercise & Equipment Safety: - Individual verbal instruction and demonstration of equipment use and safety with use of the equipment.   Cardiac Rehab from 11/30/2018 in [facility] Cardiac and Pulmonary Rehab  Date  11/30/18  Educator  AS  Instruction Review Code  1- Verbalizes Understanding      Infection Prevention: - Provides verbal and written material to individual with discussion of infection control including proper hand washing and proper equipment cleaning during exercise session.   Cardiac Rehab from 11/30/2018 in [facility] Cardiac and Pulmonary Rehab  Date  11/30/18  Educator  AS  Instruction Review Code   1- Verbalizes Understanding  Falls Prevention: - Provides verbal and written material to individual with discussion of falls prevention and safety.   Cardiac Rehab from 11/30/2018 in [facility] Cardiac and Pulmonary Rehab  Date  11/30/18  Educator  AS  Instruction Review Code  1- Verbalizes Understanding      Diabetes: - Individual verbal and written instruction to review signs/symptoms of diabetes, desired ranges of glucose level fasting, after meals and with exercise. Acknowledge that pre and post exercise glucose checks will be done for 3 sessions at entry of program.   Cardiac Rehab from 11/30/2018 in [facility] Cardiac and Pulmonary Rehab  Date  11/13/18  Educator  [facility]  Instruction Review Code  1- Verbalizes Understanding      Know Your Numbers and Risk Factors: -Group verbal and written instruction about important numbers in your health.  Discussion of what are risk factors and how they play a role in the disease process.  Review of Cholesterol, Blood Pressure, Diabetes, and BMI and the role they play in your overall health.   Sleep Hygiene: -Provides group verbal and written instruction about how sleep can affect your health.  Define sleep hygiene, discuss sleep cycles and impact of sleep habits. Review good sleep hygiene tips.    Other: -Provides group and verbal instruction on various topics (see comments)   Knowledge Questionnaire Score:   Core Components/Risk Factors/Patient Goals at Admission: Personal Goals and Risk Factors at Admission - 11/13/18 1309      Core Components/Risk Factors/Patient Goals on Admission   Diabetes  Yes    Intervention  Provide education about signs/symptoms and action to take for hypo/hyperglycemia.;Provide education about proper nutrition, including hydration, and aerobic/resistive exercise prescription along with prescribed medications to achieve blood glucose in normal ranges: Fasting glucose 65-99 mg/dL    Expected Outcomes  Short Term:  Participant verbalizes understanding of the signs/symptoms and immediate care of hyper/hypoglycemia, proper foot care and importance of medication, aerobic/resistive exercise and nutrition plan for blood glucose control.;Long Term: Attainment of HbA1C < 7%.    Heart Failure  Yes    Intervention  Provide a combined exercise and nutrition program that is supplemented with education, support and counseling about heart failure. Directed toward relieving symptoms such as shortness of breath, decreased exercise tolerance, and extremity edema.    Expected Outcomes  Improve functional capacity of life;Short term: Attendance in program 2-3 days a week with increased exercise capacity. Reported lower sodium intake. Reported increased fruit and vegetable intake. Reports medication compliance.;Short term: Daily weights obtained and reported for increase. Utilizing diuretic protocols set by physician.;Long term: Adoption of self-care skills and reduction of barriers for early signs and symptoms recognition and intervention leading to self-care maintenance.    Hypertension  Yes    Intervention  Provide education on lifestyle modifcations including regular physical activity/exercise, weight management, moderate sodium restriction and increased consumption of fresh fruit, vegetables, and low fat dairy, alcohol moderation, and smoking cessation.;Monitor prescription use compliance.    Expected Outcomes  Short Term: Continued assessment and intervention until BP is < 140/66m HG in hypertensive participants. < 130/863mHG in hypertensive participants with diabetes, heart failure or chronic kidney disease.;Long Term: Maintenance of blood pressure at goal levels.    Lipids  Yes    Intervention  Provide education and support for participant on nutrition & aerobic/resistive exercise along with prescribed medications to achieve LDL <7073mHDL >20m49m  Expected Outcomes  Short Term: Participant states understanding of desired  cholesterol values and is compliant with  medications prescribed. Participant is following exercise prescription and nutrition guidelines.;Long Term: Cholesterol controlled with medications as prescribed, with individualized exercise RX and with personalized nutrition plan. Value goals: LDL < 40m, HDL > 40 mg.       Core Components/Risk Factors/Patient Goals Review:    Core Components/Risk Factors/Patient Goals at Discharge (Final Review):    ITP Comments: ITP Comments    Row Name 11/13/18 1338           ITP Comments  Virtual initial orientation completed. EP/RD orientation scheduled for 10/8 at 11am          Comments: initial ITP

## 2018-11-30 NOTE — Patient Instructions (Signed)
Patient Instructions  Patient Details  Name: Joel [name] MRN: [id number] Date of Birth: Apr 12, 1980 Referring Provider:  Antonieta Iba, MD  Below are your personal goals for exercise, nutrition, and risk factors. Our goal is to help you stay on track towards obtaining and maintaining these goals. We will be discussing your progress on these goals with you throughout the program.  Initial Exercise Prescription: Initial Exercise Prescription - 11/30/18 1400      Date of Initial Exercise RX and Referring Provider   Date  11/30/18    Referring Provider  Gollan      Treadmill   MPH  2.1    Grade  1.5    Minutes  15    METs  3      Recumbant Bike   Level  3    RPM  60    Watts  53    Minutes  15    METs  3      NuStep   Level  4    SPM  80    Minutes  15    METs  3      Recumbant Elliptical   Level  2    RPM  50    Minutes  15    METs  3      REL-XR   Level  3    Speed  50    Minutes  15    METs  3      T5 Nustep   Level  2    SPM  80    Minutes  15    METs  3      Prescription Details   Frequency (times per week)  3      Intensity   THRR 40-80% of Max Heartrate  131-166    Ratings of Perceived Exertion  11-13    Perceived Dyspnea  0-4      Resistance Training   Training Prescription  Yes    Weight  3 lb    Reps  10-15       Exercise Goals: Frequency: Be able to perform aerobic exercise two to three times per week in program working toward 2-5 days per week of home exercise.  Intensity: Work with a perceived exertion of 11 (fairly light) - 15 (hard) while following your exercise prescription.  We will make changes to your prescription with you as you progress through the program.   Duration: Be able to do 30 to 45 minutes of continuous aerobic exercise in addition to a 5 minute warm-up and a 5 minute cool-down routine.   Nutrition Goals: Your personal nutrition goals will be established when you do your nutrition analysis with the  dietician.  The following are general nutrition guidelines to follow: Cholesterol < 200mg /day Sodium < 1500mg /day Fiber: Men under 50 yrs - 38 grams per day  Personal Goals: Personal Goals and Risk Factors at Admission - 11/30/18 1452      Core Components/Risk Factors/Patient Goals on Admission    Weight Management  Yes;Obesity;Weight Loss    Intervention  Weight Management/Obesity: Establish reasonable short term and long term weight goals.    Admit Weight  335 lb 12.8 oz (152.3 kg)    Goal Weight: Short Term  325 lb (147.4 kg)    Goal Weight: Long Term  315 lb (142.9 kg)    Intervention  Provide education about signs/symptoms and action to take for hypo/hyperglycemia.;Provide education about proper nutrition, including hydration,  and aerobic/resistive exercise prescription along with prescribed medications to achieve blood glucose in normal ranges: Fasting glucose 65-99 mg/dL    Expected Outcomes  Short Term: Participant verbalizes understanding of the signs/symptoms and immediate care of hyper/hypoglycemia, proper foot care and importance of medication, aerobic/resistive exercise and nutrition plan for blood glucose control.;Long Term: Attainment of HbA1C < 7%.    Heart Failure  Yes    Intervention  Provide a combined exercise and nutrition program that is supplemented with education, support and counseling about heart failure. Directed toward relieving symptoms such as shortness of breath, decreased exercise tolerance, and extremity edema.    Expected Outcomes  Improve functional capacity of life;Short term: Attendance in program 2-3 days a week with increased exercise capacity. Reported lower sodium intake. Reported increased fruit and vegetable intake. Reports medication compliance.;Short term: Daily weights obtained and reported for increase. Utilizing diuretic protocols set by physician.;Long term: Adoption of self-care skills and reduction of barriers for early signs and symptoms  recognition and intervention leading to self-care maintenance.    Intervention  Provide education on lifestyle modifcations including regular physical activity/exercise, weight management, moderate sodium restriction and increased consumption of fresh fruit, vegetables, and low fat dairy, alcohol moderation, and smoking cessation.;Monitor prescription use compliance.    Expected Outcomes  Short Term: Continued assessment and intervention until BP is < 140/45mm HG in hypertensive participants. < 130/44mm HG in hypertensive participants with diabetes, heart failure or chronic kidney disease.;Long Term: Maintenance of blood pressure at goal levels.    Intervention  Provide education and support for participant on nutrition & aerobic/resistive exercise along with prescribed medications to achieve LDL 70mg , HDL >40mg .    Expected Outcomes  Short Term: Participant states understanding of desired cholesterol values and is compliant with medications prescribed. Participant is following exercise prescription and nutrition guidelines.;Long Term: Cholesterol controlled with medications as prescribed, with individualized exercise RX and with personalized nutrition plan. Value goals: LDL < 70mg , HDL > 40 mg.       Tobacco Use Initial Evaluation: Social History   Tobacco Use  Smoking Status Never Smoker  Smokeless Tobacco Never Used    Exercise Goals and Review: Exercise Goals    Row Name 11/30/18 1438             Exercise Goals   Increase Physical Activity  Yes       Intervention  Provide advice, education, support and counseling about physical activity/exercise needs.;Develop an individualized exercise prescription for aerobic and resistive training based on initial evaluation findings, risk stratification, comorbidities and participant's personal goals.       Expected Outcomes  Short Term: Attend rehab on a regular basis to increase amount of physical activity.;Long Term: Add in home exercise to  make exercise part of routine and to increase amount of physical activity.;Long Term: Exercising regularly at least 3-5 days a week.       Increase Strength and Stamina  Yes       Intervention  Provide advice, education, support and counseling about physical activity/exercise needs.;Develop an individualized exercise prescription for aerobic and resistive training based on initial evaluation findings, risk stratification, comorbidities and participant's personal goals.       Expected Outcomes  Short Term: Increase workloads from initial exercise prescription for resistance, speed, and METs.;Short Term: Perform resistance training exercises routinely during rehab and add in resistance training at home;Long Term: Improve cardiorespiratory fitness, muscular endurance and strength as measured by increased METs and functional capacity (6MWT)  Able to understand and use rate of perceived exertion (RPE) scale  Yes       Intervention  Provide education and explanation on how to use RPE scale       Expected Outcomes  Short Term: Able to use RPE daily in rehab to express subjective intensity level;Long Term:  Able to use RPE to guide intensity level when exercising independently       Able to understand and use Dyspnea scale  Yes       Intervention  Provide education and explanation on how to use Dyspnea scale       Expected Outcomes  Short Term: Able to use Dyspnea scale daily in rehab to express subjective sense of shortness of breath during exertion;Long Term: Able to use Dyspnea scale to guide intensity level when exercising independently       Knowledge and understanding of Target Heart Rate Range (THRR)  Yes       Intervention  Provide education and explanation of THRR including how the numbers were predicted and where they are located for reference       Expected Outcomes  Short Term: Able to state/look up THRR;Short Term: Able to use daily as guideline for intensity in rehab;Long Term: Able to use  THRR to govern intensity when exercising independently       Able to check pulse independently  Yes       Intervention  Provide education and demonstration on how to check pulse in carotid and radial arteries.;Review the importance of being able to check your own pulse for safety during independent exercise       Expected Outcomes  Short Term: Able to explain why pulse checking is important during independent exercise;Long Term: Able to check pulse independently and accurately       Understanding of Exercise Prescription  Yes       Intervention  Provide education, explanation, and written materials on patient's individual exercise prescription       Expected Outcomes  Short Term: Able to explain program exercise prescription;Long Term: Able to explain home exercise prescription to exercise independently          Copy of goals given to participant.

## 2018-12-02 ENCOUNTER — Other Ambulatory Visit: Payer: Self-pay

## 2018-12-02 ENCOUNTER — Encounter: Payer: Medicaid Other | Admitting: *Deleted

## 2018-12-02 DIAGNOSIS — I5022 Chronic systolic (congestive) heart failure: Secondary | ICD-10-CM

## 2018-12-02 LAB — GLUCOSE, CAPILLARY
Glucose-Capillary: 127 mg/dL — ABNORMAL HIGH (ref 70–99)
Glucose-Capillary: 84 mg/dL (ref 70–99)

## 2018-12-02 NOTE — Progress Notes (Signed)
Daily Session Note  Patient Details  Name: HELMER DULL MRN: [id number] Date of Birth: [birth year] Referring Provider:     Cardiac Rehab from 11/30/2018 in [facility] Cardiac and Pulmonary Rehab  Referring Provider  Gollan      Encounter Date: 12/02/2018  Check In: Session Check In - 12/02/18 1702      Check-In   Supervising physician immediately available to respond to emergencies  See telemetry face sheet for immediately available ER MD    Location  [facility]-Cardiac & Pulmonary Rehab    Staff Present  Renita Papa, RN Vickki Hearing, BA, ACSM CEP, Exercise Physiologist;Melissa Caiola RDN, LDN    Virtual Visit  No    Medication changes reported      No    Fall or balance concerns reported     No    Warm-up and Cool-down  Performed on first and last piece of equipment    Resistance Training Performed  Yes    VAD Patient?  No    PAD/SET Patient?  No      Pain Assessment   Currently in Pain?  No/denies          Social History   Tobacco Use  Smoking Status Never Smoker  Smokeless Tobacco Never Used    Goals Met:  Independence with exercise equipment Exercise tolerated well No report of cardiac concerns or symptoms Strength training completed today  Goals Unmet:  Not Applicable  Comments: First full day of exercise!  Patient was oriented to gym and equipment including functions, settings, policies, and procedures.  Patient's individual exercise prescription and treatment plan were reviewed.  All starting workloads were established based on the results of the 6 minute walk test done at initial orientation visit.  The plan for exercise progression was also introduced and progression will be customized based on patient's performance and goals.    Dr. Emily Filbert is Medical Director for [place] and [facility].

## 2018-12-03 ENCOUNTER — Encounter: Payer: Medicaid Other | Admitting: *Deleted

## 2018-12-03 DIAGNOSIS — I5022 Chronic systolic (congestive) heart failure: Secondary | ICD-10-CM

## 2018-12-03 LAB — GLUCOSE, CAPILLARY
Glucose-Capillary: 124 mg/dL — ABNORMAL HIGH (ref 70–99)
Glucose-Capillary: 119 mg/dL — ABNORMAL HIGH (ref 70–99)

## 2018-12-03 NOTE — Progress Notes (Signed)
Daily Session Note  Patient Details  Name: Joel [name] MRN: [id number] Date of Birth: 1980/08/26 Referring Provider:     Cardiac Rehab from 11/30/2018 in [facility] Cardiac and Pulmonary Rehab  Referring Provider  Gollan      Encounter Date: 12/03/2018  Check In: Session Check In - 12/03/18 1700      Check-In   Supervising physician immediately available to respond to emergencies  See telemetry face sheet for immediately available ER MD    Staff Present  Gerlene Burdock, RN, BSN-BC, Laveda Norman, BS, ACSM CEP, Exercise Physiologist;Joseph Tessie Fass RCP,RRT,BSRT    Virtual Visit  No    Medication changes reported      No    Fall or balance concerns reported     No    Tobacco Cessation  No Change    Warm-up and Cool-down  Performed on first and last piece of equipment    Resistance Training Performed  Yes    VAD Patient?  No      Pain Assessment   Currently in Pain?  No/denies          Social History   Tobacco Use  Smoking Status Never Smoker  Smokeless Tobacco Never Used    Goals Met:  Proper associated with RPD/PD & O2 Sat Independence with exercise equipment No report of cardiac concerns or symptoms Strength training completed today  Goals Unmet:  Not Applicable  Comments:  Pt able to follow exercise prescription today without complaint.  Will continue to monitor for progression.    Dr. Emily Filbert is Medical Director for [place] and [facility].

## 2018-12-03 NOTE — Progress Notes (Signed)
Initial RD eval complete

## 2018-12-07 ENCOUNTER — Encounter: Payer: Self-pay | Admitting: Family

## 2018-12-07 ENCOUNTER — Other Ambulatory Visit: Payer: Self-pay

## 2018-12-07 ENCOUNTER — Encounter: Payer: Medicaid Other | Admitting: *Deleted

## 2018-12-07 DIAGNOSIS — I5022 Chronic systolic (congestive) heart failure: Secondary | ICD-10-CM | POA: Diagnosis not present

## 2018-12-07 LAB — GLUCOSE, CAPILLARY
Glucose-Capillary: 115 mg/dL — ABNORMAL HIGH (ref 70–99)
Glucose-Capillary: 87 mg/dL (ref 70–99)

## 2018-12-07 NOTE — Progress Notes (Signed)
Daily Session Note  Patient Details  Name: SHAWN CARATTINI MRN: [id number] Date of Birth: 1980-05-23 Referring Provider:     Cardiac Rehab from 11/30/2018 in [facility] Cardiac and Pulmonary Rehab  Referring Provider  Gollan      Encounter Date: 12/07/2018  Check In: Session Check In - 12/07/18 1702      Check-In   Supervising physician immediately available to respond to emergencies  See telemetry face sheet for immediately available ER MD    Location  [facility]-Cardiac & Pulmonary Rehab    Staff Present  Renita Papa, RN Moises Blood, BS, ACSM CEP, Exercise Physiologist;Joseph Tessie Fass RCP,RRT,BSRT    Virtual Visit  No    Medication changes reported      No    Fall or balance concerns reported     No    Warm-up and Cool-down  Performed on first and last piece of equipment    Resistance Training Performed  Yes    VAD Patient?  No    PAD/SET Patient?  No      Pain Assessment   Currently in Pain?  No/denies          Social History   Tobacco Use  Smoking Status Never Smoker  Smokeless Tobacco Never Used    Goals Met:  Independence with exercise equipment Exercise tolerated well No report of cardiac concerns or symptoms Strength training completed today  Goals Unmet:  Not Applicable  Comments: Pt able to follow exercise prescription today without complaint.  Will continue to monitor for progression.    Dr. Emily Filbert is Medical Director for [place] and [facility].

## 2018-12-10 ENCOUNTER — Other Ambulatory Visit: Payer: Self-pay

## 2018-12-10 ENCOUNTER — Encounter: Payer: Medicaid Other | Admitting: *Deleted

## 2018-12-10 DIAGNOSIS — I5022 Chronic systolic (congestive) heart failure: Secondary | ICD-10-CM

## 2018-12-10 NOTE — Progress Notes (Signed)
Daily Session Note  Patient Details  Name: Joel [name] MRN: [id number] Date of Birth: Apr 08, 1980 Referring Provider:     Cardiac Rehab from 11/30/2018 in [facility] Cardiac and Pulmonary Rehab  Referring Provider  Gollan      Encounter Date: 12/10/2018  Check In: Session Check In - 12/10/18 1640      Check-In   Supervising physician immediately available to respond to emergencies  See telemetry face sheet for immediately available ER MD    Location  [facility]-Cardiac & Pulmonary Rehab    Staff Present  Renita Papa, RN BSN;Jessica [place], MA, RCEP, CCRP, [place], BS, ACSM CEP, Exercise Physiologist;Joseph [place] RCP,RRT,BSRT    Virtual Visit  No    Medication changes reported      No    Fall or balance concerns reported     No    Warm-up and Cool-down  Performed on first and last piece of equipment    Resistance Training Performed  Yes    VAD Patient?  No    PAD/SET Patient?  No      Pain Assessment   Currently in Pain?  No/denies          Social History   Tobacco Use  Smoking Status Never Smoker  Smokeless Tobacco Never Used    Goals Met:  Independence with exercise equipment Exercise tolerated well No report of cardiac concerns or symptoms Strength training completed today  Goals Unmet:  Not Applicable  Comments: Pt able to follow exercise prescription today without complaint.  Will continue to monitor for progression.    Dr. Emily Filbert is Medical Director for [place] and [facility].

## 2018-12-14 ENCOUNTER — Other Ambulatory Visit: Payer: Self-pay

## 2018-12-14 ENCOUNTER — Encounter: Payer: Medicaid Other | Attending: Cardiovascular Disease | Admitting: *Deleted

## 2018-12-14 DIAGNOSIS — I5022 Chronic systolic (congestive) heart failure: Secondary | ICD-10-CM | POA: Diagnosis present

## 2018-12-14 NOTE — Progress Notes (Signed)
Daily Session Note  Patient Details  Name: LEAMAN ABE MRN: [id number] Date of Birth: Jun 23, 1980 Referring Provider:     Cardiac Rehab from 11/30/2018 in [facility] Cardiac and Pulmonary Rehab  Referring Provider  Gollan      Encounter Date: 12/14/2018  Check In: Session Check In - 12/14/18 [place]      Check-In   Supervising physician immediately available to respond to emergencies  See telemetry face sheet for immediately available ER MD    Location  [facility]-Cardiac & Pulmonary Rehab    Staff Present  Renita Papa, RN Moises Blood, BS, ACSM CEP, Exercise Physiologist;Joseph Tessie Fass RCP,RRT,BSRT    Virtual Visit  No    Medication changes reported      No    Fall or balance concerns reported     No    Warm-up and Cool-down  Performed on first and last piece of equipment    Resistance Training Performed  Yes    VAD Patient?  No    PAD/SET Patient?  No      Pain Assessment   Currently in Pain?  No/denies          Social History   Tobacco Use  Smoking Status Never Smoker  Smokeless Tobacco Never Used    Goals Met:  Independence with exercise equipment Exercise tolerated well No report of cardiac concerns or symptoms Strength training completed today  Goals Unmet:  Not Applicable  Comments: Pt able to follow exercise prescription today without complaint.  Will continue to monitor for progression.  Reviewed home exercise with pt today.  Pt plans to go to [employer] for exercise.  Reviewed THR, pulse, RPE, sign and symptoms, NTG use, and when to call 911 or MD.  Also discussed weather considerations and indoor options.  Pt voiced understanding.   Dr. Emily Filbert is Medical Director for [place] and [facility].

## 2018-12-16 ENCOUNTER — Encounter: Payer: Self-pay | Admitting: *Deleted

## 2018-12-16 ENCOUNTER — Encounter: Payer: Medicaid Other | Admitting: *Deleted

## 2018-12-16 ENCOUNTER — Other Ambulatory Visit: Payer: Self-pay

## 2018-12-16 DIAGNOSIS — I5022 Chronic systolic (congestive) heart failure: Secondary | ICD-10-CM | POA: Diagnosis not present

## 2018-12-16 NOTE — Progress Notes (Signed)
Daily Session Note  Patient Details  Name: MAHLON GABRIELLE MRN: [id number] Date of Birth: 01-12-1981 Referring Provider:     Cardiac Rehab from 11/30/2018 in [facility] Cardiac and Pulmonary Rehab  Referring Provider  Gollan      Encounter Date: 12/16/2018  Check In: Session Check In - 12/16/18 1659      Check-In   Supervising physician immediately available to respond to emergencies  See telemetry face sheet for immediately available ER MD    Location  [facility]-Cardiac & Pulmonary Rehab    Staff Present  Renita Papa, RN Vickki Hearing, BA, ACSM CEP, Exercise Physiologist;Melissa Caiola RDN, LDN    Virtual Visit  No    Medication changes reported      No    Fall or balance concerns reported     No    Warm-up and Cool-down  Performed on first and last piece of equipment    Resistance Training Performed  Yes    VAD Patient?  No    PAD/SET Patient?  No      Pain Assessment   Currently in Pain?  No/denies          Social History   Tobacco Use  Smoking Status Never Smoker  Smokeless Tobacco Never Used    Goals Met:  Independence with exercise equipment Exercise tolerated well No report of cardiac concerns or symptoms Strength training completed today  Goals Unmet:  Not Applicable  Comments: Pt able to follow exercise prescription today without complaint.  Will continue to monitor for progression.    Dr. Emily Filbert is Medical Director for [place] and [facility].

## 2018-12-16 NOTE — Progress Notes (Signed)
Cardiac Individual Treatment Plan  Patient Details  Name: Joel [name] MRN: [id number] Date of Birth: 06-03-1980 Referring Provider:     Cardiac Rehab from 11/30/2018 in [facility] Cardiac and Pulmonary Rehab  Referring Provider  Gollan      Initial Encounter Date:    Cardiac Rehab from 11/30/2018 in [facility] Cardiac and Pulmonary Rehab  Date  11/30/18      Visit Diagnosis: Heart failure, chronic systolic ([place])  Patient's Home Medications on Admission:  Current Outpatient Medications:    amLODipine (NORVASC) 5 MG tablet, Take 1 tablet (5 mg total) by mouth daily., Disp: 90 tablet, Rfl: 3   aspirin 81 MG chewable tablet, Chew 1 tablet (81 mg total) by mouth daily., Disp: , Rfl:    atorvastatin (LIPITOR) 80 MG tablet, Take 1 tablet (80 mg total) by mouth daily at 6 PM., Disp: 90 tablet, Rfl: 3   BYDUREON 2 MG PEN, Inject 2 mg into the skin once a week. (Patient not taking: Reported on 11/13/2018), Disp: 4 each, Rfl: 2   carvedilol (COREG) 25 MG tablet, Take 1 tablet (25 mg total) by mouth 2 (two) times daily with a meal., Disp: 180 tablet, Rfl: 3   empagliflozin (JARDIANCE) 25 MG TABS tablet, Take by mouth., Disp: , Rfl:    esomeprazole (NEXIUM) 20 MG capsule, Take 20 mg by mouth daily at 12 noon., Disp: , Rfl:    furosemide (LASIX) 20 MG tablet, Take 20 mg by mouth as needed., Disp: , Rfl:    HUMALOG 100 UNIT/ML injection, 17 units at breakfast 13 units at lunch 25 units at dinner, Disp: , Rfl:    insulin aspart (NOVOLOG) 100 UNIT/ML injection, Inject 5 Units into the skin 3 (three) times daily with meals. (Patient not taking: Reported on 11/30/2018), Disp: 10 mL, Rfl: 11   insulin detemir (LEVEMIR) 100 UNIT/ML injection, Inject 0.45 mLs (45 Units total) into the skin 2 (two) times daily., Disp: 20 mL, Rfl: 2   liraglutide (VICTOZA) 18 MG/3ML SOPN, Inject into the skin., Disp: , Rfl:    sacubitril-valsartan (ENTRESTO) 97-103 MG, Take 1 tablet by mouth 2 (two) times daily., Disp: 180  tablet, Rfl: 3   spironolactone (ALDACTONE) 25 MG tablet, Take 25 mg by mouth daily., Disp: , Rfl:   Past Medical History: Past Medical History:  Diagnosis Date   Anxiety    Chronic diastolic CHF (congestive heart failure) (HCC)    Hyperlipidemia    Hypertension    Hypertensive heart disease    Noncompliance with medications    Obesity    Stroke ([place])    Systolic dysfunction    a. TTE 06/2014: EF 45-50%, normal wall motion, mild MR, mild AI    Tobacco Use: Social History   Tobacco Use  Smoking Status Never Smoker  Smokeless Tobacco Never Used    Labs: Recent Review Flowsheet Data    Labs for ITP Cardiac and Pulmonary Rehab Latest Ref Rng & Units 06/11/2014 10/11/2014 02/12/2017 02/13/2017 12/15/2017   Cholestrol 0 - 200 mg/dL 361(H) - - 435(H) -   LDLCALC 0 - 99 mg/dL 271(H) - - 352(H) -   HDL >40 mg/dL 41 - - 30(L) -   Trlycerides <150 mg/dL 246(H) - - 267(H) -   Hemoglobin A1c 4.8 - 5.6 % - 7.3(H) 14.7(H) - 13.3(H)       Exercise Target Goals: Exercise Program Goal: Individual exercise prescription set using results from initial 6 min walk test and THRR while considering  patients activity barriers  and safety.   Exercise Prescription Goal: Initial exercise prescription builds to 30-45 minutes a day of aerobic activity, 2-3 days per week.  Home exercise guidelines will be given to patient during program as part of exercise prescription that the participant will acknowledge.  Activity Barriers & Risk Stratification: Activity Barriers & Cardiac Risk Stratification - 11/13/18 1313      Activity Barriers & Cardiac Risk Stratification   Activity Barriers  None    Cardiac Risk Stratification  High       6 Minute Walk: 6 Minute Walk    Row Name 11/30/18 1431         6 Minute Walk   Phase  Initial     Distance  1120 feet     Walk Time  6 minutes     # of Rest Breaks  0     MPH  2.12     METS  3.1     RPE  9     Perceived Dyspnea   1     VO2 Peak  10.8      Symptoms  No     Resting HR  97 bpm     Resting BP  124/84     Resting Oxygen Saturation   97 %     Exercise Oxygen Saturation  during 6 min walk  99 %     Max Ex. HR  114 bpm     Max Ex. BP  126/88     2 Minute Post BP  122/84        Oxygen Initial Assessment:   Oxygen Re-Evaluation:   Oxygen Discharge (Final Oxygen Re-Evaluation):   Initial Exercise Prescription: Initial Exercise Prescription - 11/30/18 1400      Date of Initial Exercise RX and Referring Provider   Date  11/30/18    Referring Provider  Gollan      Treadmill   MPH  2.1    Grade  1.5    Minutes  15    METs  3      Recumbant Bike   Level  3    RPM  60    Watts  53    Minutes  15    METs  3      NuStep   Level  4    SPM  80    Minutes  15    METs  3      Recumbant Elliptical   Level  2    RPM  50    Minutes  15    METs  3      REL-XR   Level  3    Speed  50    Minutes  15    METs  3      T5 Nustep   Level  2    SPM  80    Minutes  15    METs  3      Prescription Details   Frequency (times per week)  3      Intensity   THRR 40-80% of Max Heartrate  131-166    Ratings of Perceived Exertion  11-13    Perceived Dyspnea  0-4      Resistance Training   Training Prescription  Yes    Weight  3 lb    Reps  10-15       Perform Capillary Blood Glucose checks as needed.  Exercise Prescription Changes: Exercise Prescription Changes    Row  Name 11/30/18 1400 12/10/18 1400           Response to Exercise   Blood Pressure (Admit)  124/84  136/86      Blood Pressure (Exercise)  126/88  148/82      Blood Pressure (Exit)  122/84  132/82      Heart Rate (Admit)  97 bpm  82 bpm      Heart Rate (Exercise)  114 bpm  129 bpm      Heart Rate (Exit)  99 bpm  122 bpm      Oxygen Saturation (Admit)  97 %  --      Oxygen Saturation (Exercise)  99 %  --      Rating of Perceived Exertion (Exercise)  9  11      Perceived Dyspnea (Exercise)  1  --      Symptoms  none  none       Duration  --  Progress to 30 minutes of  aerobic without signs/symptoms of physical distress      Intensity  --  THRR unchanged        Progression   Progression  --  Continue to progress workloads to maintain intensity without signs/symptoms of physical distress.      Average METs  --  2.6        Resistance Training   Training Prescription  --  Yes      Weight  --  3 lb      Reps  --  10-15        Treadmill   MPH  --  2.1      Grade  --  1.5      Minutes  --  15      METs  --  3        NuStep   Level  --  2      SPM  --  80      Minutes  --  15      METs  --  2.2        REL-XR   Level  --  3      Speed  --  50      Minutes  --  15      METs  --  1.3         Exercise Comments: Exercise Comments    Row Name 12/14/18 1708           Exercise Comments  Reviewed home exercise with pt today.  Pt plans to go to [employer] for exercise.  Reviewed THR, pulse, RPE, sign and symptoms, NTG use, and when to call 911 or MD.  Also discussed weather considerations and indoor options.  Pt voiced understanding.          Exercise Goals and Review: Exercise Goals    Row Name 11/30/18 1438             Exercise Goals   Increase Physical Activity  Yes       Intervention  Provide advice, education, support and counseling about physical activity/exercise needs.;Develop an individualized exercise prescription for aerobic and resistive training based on initial evaluation findings, risk stratification, comorbidities and participant's personal goals.       Expected Outcomes  Short Term: Attend rehab on a regular basis to increase amount of physical activity.;Long Term: Add in home exercise to make exercise part of routine and to increase amount of physical activity.;Long Term: Exercising  regularly at least 3-5 days a week.       Increase Strength and Stamina  Yes       Intervention  Provide advice, education, support and counseling about physical activity/exercise needs.;Develop an  individualized exercise prescription for aerobic and resistive training based on initial evaluation findings, risk stratification, comorbidities and participant's personal goals.       Expected Outcomes  Short Term: Increase workloads from initial exercise prescription for resistance, speed, and METs.;Short Term: Perform resistance training exercises routinely during rehab and add in resistance training at home;Long Term: Improve cardiorespiratory fitness, muscular endurance and strength as measured by increased METs and functional capacity (6MWT)       Able to understand and use rate of perceived exertion (RPE) scale  Yes       Intervention  Provide education and explanation on how to use RPE scale       Expected Outcomes  Short Term: Able to use RPE daily in rehab to express subjective intensity level;Long Term:  Able to use RPE to guide intensity level when exercising independently       Able to understand and use Dyspnea scale  Yes       Intervention  Provide education and explanation on how to use Dyspnea scale       Expected Outcomes  Short Term: Able to use Dyspnea scale daily in rehab to express subjective sense of shortness of breath during exertion;Long Term: Able to use Dyspnea scale to guide intensity level when exercising independently       Knowledge and understanding of Target Heart Rate Range (THRR)  Yes       Intervention  Provide education and explanation of THRR including how the numbers were predicted and where they are located for reference       Expected Outcomes  Short Term: Able to state/look up THRR;Short Term: Able to use daily as guideline for intensity in rehab;Long Term: Able to use THRR to govern intensity when exercising independently       Able to check pulse independently  Yes       Intervention  Provide education and demonstration on how to check pulse in carotid and radial arteries.;Review the importance of being able to check your own pulse for safety during  independent exercise       Expected Outcomes  Short Term: Able to explain why pulse checking is important during independent exercise;Long Term: Able to check pulse independently and accurately       Understanding of Exercise Prescription  Yes       Intervention  Provide education, explanation, and written materials on patient's individual exercise prescription       Expected Outcomes  Short Term: Able to explain program exercise prescription;Long Term: Able to explain home exercise prescription to exercise independently          Exercise Goals Re-Evaluation : Exercise Goals Re-Evaluation    Row Name 12/02/18 1703 12/10/18 1441 12/14/18 1709         Exercise Goal Re-Evaluation   Exercise Goals Review  Increase Physical Activity;Able to understand and use rate of perceived exertion (RPE) scale;Knowledge and understanding of Target Heart Rate Range (THRR);Understanding of Exercise Prescription;Increase Strength and Stamina;Able to check pulse independently  Increase Physical Activity;Increase Strength and Stamina;Able to understand and use rate of perceived exertion (RPE) scale;Knowledge and understanding of Target Heart Rate Range (THRR);Able to check pulse independently;Understanding of Exercise Prescription  Increase Physical Activity;Increase Strength and Stamina;Able to understand and use  rate of perceived exertion (RPE) scale;Knowledge and understanding of Target Heart Rate Range (THRR);Able to check pulse independently;Understanding of Exercise Prescription     Comments  Reviewed RPE scale, THR and program prescription with pt today.  Pt voiced understanding and was given a copy of goals to take home.  Dorrance tolerates exercise well and seems to enjoy sessions.  Reviewed home exercise with pt today.  Pt plans to go to [employer] for exercise.  Reviewed THR, pulse, RPE, sign and symptoms, NTG use, and when to call 911 or MD.  Also discussed weather considerations and indoor options.  Pt voiced  understanding.     Expected Outcomes  Short: Use RPE daily to regulate intensity. Long: Follow program prescription in THR.  Short - attend HT consistently Long :  increase MET level  Short - warm up and cool down correctly and check HR while at PF Long - maintain exercise on his own        Discharge Exercise Prescription (Final Exercise Prescription Changes): Exercise Prescription Changes - 12/10/18 1400      Response to Exercise   Blood Pressure (Admit)  136/86    Blood Pressure (Exercise)  148/82    Blood Pressure (Exit)  132/82    Heart Rate (Admit)  82 bpm    Heart Rate (Exercise)  129 bpm    Heart Rate (Exit)  122 bpm    Rating of Perceived Exertion (Exercise)  11    Symptoms  none    Duration  Progress to 30 minutes of  aerobic without signs/symptoms of physical distress    Intensity  THRR unchanged      Progression   Progression  Continue to progress workloads to maintain intensity without signs/symptoms of physical distress.    Average METs  2.6      Resistance Training   Training Prescription  Yes    Weight  3 lb    Reps  10-15      Treadmill   MPH  2.1    Grade  1.5    Minutes  15    METs  3      NuStep   Level  2    SPM  80    Minutes  15    METs  2.2      REL-XR   Level  3    Speed  50    Minutes  15    METs  1.3       Nutrition:  Target Goals: Understanding of nutrition guidelines, daily intake of sodium <1517m, cholesterol <2037m calories 30% from fat and 7% or less from saturated fats, daily to have 5 or more servings of fruits and vegetables.  Biometrics: Pre Biometrics - 11/30/18 1440      Pre Biometrics   Height  5' 8" (1.727 m)    Weight  (!) 335 lb 12.8 oz (152.3 kg)    BMI (Calculated)  51.07    Single Leg Stand  30 seconds        Nutrition Therapy Plan and Nutrition Goals: Nutrition Therapy & Goals - 12/03/18 1403      Nutrition Therapy   Diet  low Na, HH, DM diet    Protein (specify units)  120g    Fiber  30 grams     Whole Grain Foods  3 servings    Saturated Fats  12 max. grams    Fruits and Vegetables  5 servings/day    Sodium  1.5  grams      Personal Nutrition Goals   Nutrition Goal  ST: eat more consistent meals LT: manage BG, get healthy    Comments  Sometimes skips breakfast, eggs and bacon when do have it. no lunch, sugar free kool aid squirts in water. D: no rice bread and limited potatoes. Meat (chicken, fish, steak) and greens (squash, zucchini, green beans) with gravy sometimes. will normally bake or pan fry with oil. Pt reports having limited appetite after heart event. Hunger cues growling in stomach. Discussed high vs low BG, DM, HH eating and general recommendations like eating consistently to ensure pt gets enough protein and kcal as well as manage BG. Discussed importance of eating and exercise. Discussed set point weight and health outcomes.      Intervention Plan   Intervention  Prescribe, educate and counsel regarding individualized specific dietary modifications aiming towards targeted core components such as weight, hypertension, lipid management, diabetes, heart failure and other comorbidities.;Nutrition handout(s) given to patient.    Expected Outcomes  Short Term Goal: Understand basic principles of dietary content, such as calories, fat, sodium, cholesterol and nutrients.;Short Term Goal: A plan has been developed with personal nutrition goals set during dietitian appointment.;Long Term Goal: Adherence to prescribed nutrition plan.       Nutrition Assessments: Nutrition Assessments - 12/01/18 1410      MEDFICTS Scores   Pre Score  48       Nutrition Goals Re-Evaluation:   Nutrition Goals Discharge (Final Nutrition Goals Re-Evaluation):   Psychosocial: Target Goals: Acknowledge presence or absence of significant depression and/or stress, maximize coping skills, provide positive support system. Participant is able to verbalize types and ability to use techniques and skills  needed for reducing stress and depression.   Initial Review & Psychosocial Screening: Initial Psych Review & Screening - 11/13/18 1311      Initial Review   Current issues with  Current Stress Concerns    Source of Stress Concerns  Chronic Illness;Unable to participate in former interests or hobbies;Unable to perform yard/household activities    Comments  Monique is struggling with the issues related to his HF, like SOB and fatigue. He has a great support system and his wife helps him with managing his medications. He is looking forward to starting HT so he can gain back some control in his life      [place]?  Yes      Barriers   Psychosocial barriers to participate in program  There are no identifiable barriers or psychosocial needs.;The patient should benefit from training in stress management and relaxation.      Screening Interventions   Interventions  Encouraged to exercise;Provide feedback about the scores to participant;To provide support and resources with identified psychosocial needs    Expected Outcomes  Short Term goal: Utilizing psychosocial counselor, staff and physician to assist with identification of specific Stressors or current issues interfering with healing process. Setting desired goal for each stressor or current issue identified.;Long Term Goal: Stressors or current issues are controlled or eliminated.;Long Term goal: The participant improves quality of Life and PHQ9 Scores as seen by post scores and/or verbalization of changes;Short Term goal: Identification and review with participant of any Quality of Life or Depression concerns found by scoring the questionnaire.       Quality of Life Scores:  Quality of Life - 11/30/18 1448      Quality of Life   Select  Quality of Life  Quality of Life Scores   Health/Function Pre  6.93 %    Socioeconomic Pre  13.71 %    Psych/Spiritual Pre  5.42 %    Family Pre  16.1 %    GLOBAL Pre  9.48  %      Scores of 19 and below usually indicate a poorer quality of life in these areas.  A difference of  2-3 points is a clinically meaningful difference.  A difference of 2-3 points in the total score of the Quality of Life Index has been associated with significant improvement in overall quality of life, self-image, physical symptoms, and general health in studies assessing change in quality of life.  PHQ-9: Recent Review Flowsheet Data    Depression screen [facility] 2/9 11/30/2018 03/22/2018 12/19/2017 12/15/2017 11/12/2017   Decreased Interest 2 3 0 0 0   Down, Depressed, Hopeless 3 2 0 0 1   PHQ - 2 Score 5 5 0 0 1   Altered sleeping 1 1 0 - -   Tired, decreased energy 3 3 0 - -   Change in appetite 3 3 0 - -   Feeling bad or failure about yourself  3 3 0 - -   Trouble concentrating 3 1 0 - -   Moving slowly or fidgety/restless 1 2 0 - -   Suicidal thoughts 3  2 0 - -   PHQ-9 Score 22 20 0 - -   Difficult doing work/chores Extremely dIfficult Extremely dIfficult Not difficult at all - -     Interpretation of Total Score  Total Score Depression Severity:  1-4 = Minimal depression, 5-9 = Mild depression, 10-14 = Moderate depression, 15-19 = Moderately severe depression, 20-27 = Severe depression   Psychosocial Evaluation and Intervention: Psychosocial Evaluation - 11/13/18 1318      Psychosocial Evaluation & Interventions   Comments  Alfonsa is struggling with the issues related to his HF, like SOB and fatigue. He has a great support system and his wife helps him with managing his medications. He is looking forward to starting HT so he can gain back some control in his life    Expected Outcomes  Short: Attend [facility] to gain confidence in exercising and feel more "like himself". Long: develop self care habits.       Psychosocial Re-Evaluation:   Psychosocial Discharge (Final Psychosocial Re-Evaluation):   Vocational Rehabilitation: Provide vocational rehab assistance to qualifying  candidates.   Vocational Rehab Evaluation & Intervention: Vocational Rehab - 11/13/18 1312      Initial Vocational Rehab Evaluation & Intervention   Assessment shows need for Vocational Rehabilitation  No       Education: Education Goals: Education classes will be provided on a variety of topics geared toward better understanding of heart health and risk factor modification. Participant will state understanding/return demonstration of topics presented as noted by education test scores.  Learning Barriers/Preferences: Learning Barriers/Preferences - 11/13/18 1311      Learning Barriers/Preferences   Learning Barriers  None    Learning Preferences  None       Education Topics:  AED/CPR: - Group verbal and written instruction with the use of models to demonstrate the basic use of the AED with the basic ABC's of resuscitation.   General Nutrition Guidelines/Fats and Fiber: -Group instruction provided by verbal, written material, models and posters to present the general guidelines for heart healthy nutrition. Gives an explanation and review of dietary fats and fiber.   Controlling Sodium/Reading Food Labels: -  Group verbal and written material supporting the discussion of sodium use in heart healthy nutrition. Review and explanation with models, verbal and written materials for utilization of the food label.   Exercise Physiology & General Exercise Guidelines: - Group verbal and written instruction with models to review the exercise physiology of the cardiovascular system and associated critical values. Provides general exercise guidelines with specific guidelines to those with heart or lung disease.    Aerobic Exercise & Resistance Training: - Gives group verbal and written instruction on the various components of exercise. Focuses on aerobic and resistive training programs and the benefits of this training and how to safely progress through these programs..   Cardiac Rehab from  12/03/2018 in [facility] Cardiac and Pulmonary Rehab  Date  12/03/18  Educator  [facility]  Instruction Review Code  1- Verbalizes Understanding      Flexibility, Balance, Mind/Body Relaxation: Provides group verbal/written instruction on the benefits of flexibility and balance training, including mind/body exercise modes such as yoga, pilates and tai chi.  Demonstration and skill practice provided.   Stress and Anxiety: - Provides group verbal and written instruction about the health risks of elevated stress and causes of high stress.  Discuss the correlation between heart/lung disease and anxiety and treatment options. Review healthy ways to manage with stress and anxiety.   Depression: - Provides group verbal and written instruction on the correlation between heart/lung disease and depressed mood, treatment options, and the stigmas associated with seeking treatment.   Anatomy & Physiology of the Heart: - Group verbal and written instruction and models provide basic cardiac anatomy and physiology, with the coronary electrical and arterial systems. Review of Valvular disease and Heart Failure   Cardiac Procedures: - Group verbal and written instruction to review commonly prescribed medications for heart disease. Reviews the medication, class of the drug, and side effects. Includes the steps to properly store meds and maintain the prescription regimen. (beta blockers and nitrates)   Cardiac Medications I: - Group verbal and written instruction to review commonly prescribed medications for heart disease. Reviews the medication, class of the drug, and side effects. Includes the steps to properly store meds and maintain the prescription regimen.   Cardiac Medications II: -Group verbal and written instruction to review commonly prescribed medications for heart disease. Reviews the medication, class of the drug, and side effects. (all other drug classes)   Cardiac Rehab from 12/03/2018 in [facility] Cardiac  and Pulmonary Rehab  Date  12/03/18  Educator  [facility]  Instruction Review Code  1- Verbalizes Understanding       Go Sex-Intimacy & Heart Disease, Get SMART - Goal Setting: - Group verbal and written instruction through game format to discuss heart disease and the return to sexual intimacy. Provides group verbal and written material to discuss and apply goal setting through the application of the S.M.A.R.T. Method.   Other Matters of the Heart: - Provides group verbal, written materials and models to describe Stable Angina and Peripheral Artery. Includes description of the disease process and treatment options available to the cardiac patient.   Exercise & Equipment Safety: - Individual verbal instruction and demonstration of equipment use and safety with use of the equipment.   Cardiac Rehab from 12/03/2018 in [facility] Cardiac and Pulmonary Rehab  Date  11/30/18  Educator  AS  Instruction Review Code  1- Verbalizes Understanding      Infection Prevention: - Provides verbal and written material to individual with discussion of infection control including proper hand  washing and proper equipment cleaning during exercise session.   Cardiac Rehab from 12/03/2018 in [facility] Cardiac and Pulmonary Rehab  Date  11/30/18  Educator  AS  Instruction Review Code  1- Verbalizes Understanding      Falls Prevention: - Provides verbal and written material to individual with discussion of falls prevention and safety.   Cardiac Rehab from 12/03/2018 in [facility] Cardiac and Pulmonary Rehab  Date  11/30/18  Educator  AS  Instruction Review Code  1- Verbalizes Understanding      Diabetes: - Individual verbal and written instruction to review signs/symptoms of diabetes, desired ranges of glucose level fasting, after meals and with exercise. Acknowledge that pre and post exercise glucose checks will be done for 3 sessions at entry of program.   Cardiac Rehab from 12/03/2018 in [facility] Cardiac and Pulmonary  Rehab  Date  11/13/18  Educator  [facility]  Instruction Review Code  1- Verbalizes Understanding      Know Your Numbers and Risk Factors: -Group verbal and written instruction about important numbers in your health.  Discussion of what are risk factors and how they play a role in the disease process.  Review of Cholesterol, Blood Pressure, Diabetes, and BMI and the role they play in your overall health.   Cardiac Rehab from 12/03/2018 in [facility] Cardiac and Pulmonary Rehab  Date  12/03/18  Educator  [facility]  Instruction Review Code  1- Verbalizes Understanding      Sleep Hygiene: -Provides group verbal and written instruction about how sleep can affect your health.  Define sleep hygiene, discuss sleep cycles and impact of sleep habits. Review good sleep hygiene tips.    Other: -Provides group and verbal instruction on various topics (see comments)   Knowledge Questionnaire Score:   Core Components/Risk Factors/Patient Goals at Admission: Personal Goals and Risk Factors at Admission - 11/30/18 1452      Core Components/Risk Factors/Patient Goals on Admission    Weight Management  Yes;Obesity;Weight Loss    Intervention  Weight Management/Obesity: Establish reasonable short term and long term weight goals.    Admit Weight  335 lb 12.8 oz (152.3 kg)    Goal Weight: Short Term  325 lb (147.4 kg)    Goal Weight: Long Term  315 lb (142.9 kg)    Intervention  Provide education about signs/symptoms and action to take for hypo/hyperglycemia.;Provide education about proper nutrition, including hydration, and aerobic/resistive exercise prescription along with prescribed medications to achieve blood glucose in normal ranges: Fasting glucose 65-99 mg/dL    Expected Outcomes  Short Term: Participant verbalizes understanding of the signs/symptoms and immediate care of hyper/hypoglycemia, proper foot care and importance of medication, aerobic/resistive exercise and nutrition plan for blood glucose  control.;Long Term: Attainment of HbA1C < 7%.    Heart Failure  Yes    Intervention  Provide a combined exercise and nutrition program that is supplemented with education, support and counseling about heart failure. Directed toward relieving symptoms such as shortness of breath, decreased exercise tolerance, and extremity edema.    Expected Outcomes  Improve functional capacity of life;Short term: Attendance in program 2-3 days a week with increased exercise capacity. Reported lower sodium intake. Reported increased fruit and vegetable intake. Reports medication compliance.;Short term: Daily weights obtained and reported for increase. Utilizing diuretic protocols set by physician.;Long term: Adoption of self-care skills and reduction of barriers for early signs and symptoms recognition and intervention leading to self-care maintenance.    Intervention  Provide education on lifestyle modifcations including  regular physical activity/exercise, weight management, moderate sodium restriction and increased consumption of fresh fruit, vegetables, and low fat dairy, alcohol moderation, and smoking cessation.;Monitor prescription use compliance.    Expected Outcomes  Short Term: Continued assessment and intervention until BP is < 140/92m HG in hypertensive participants. < 130/890mHG in hypertensive participants with diabetes, heart failure or chronic kidney disease.;Long Term: Maintenance of blood pressure at goal levels.    Intervention  Provide education and support for participant on nutrition & aerobic/resistive exercise along with prescribed medications to achieve LDL <7057mHDL >11m71m  Expected Outcomes  Short Term: Participant states understanding of desired cholesterol values and is compliant with medications prescribed. Participant is following exercise prescription and nutrition guidelines.;Long Term: Cholesterol controlled with medications as prescribed, with individualized exercise RX and with  personalized nutrition plan. Value goals: LDL < 70mg48mL > 40 mg.       Core Components/Risk Factors/Patient Goals Review:    Core Components/Risk Factors/Patient Goals at Discharge (Final Review):    ITP Comments: ITP Comments    Row Name 11/13/18 1338 11/30/18 1441 12/02/18 1703 12/03/18 1429 12/10/18 1720   ITP Comments  Virtual initial orientation completed. EP/RD orientation scheduled for 10/8 at 11am  Initial 6MWT complete.  ITP created and sent to Dr MilleSabra Heckst full day of exercise!  Patient was oriented to gym and equipment including functions, settings, policies, and procedures.  Patient's individual exercise prescription and treatment plan were reviewed.  All starting workloads were established based on the results of the 6 minute walk test done at initial orientation visit.  The plan for exercise progression was also introduced and progression will be customized based on patient's performance and goals.  Initial RD eval complete  BrianMykaelasking about weight gain. He was educated on fluid retention and heart failure. He was encouraged to weigh every day and was given a heart failure daily symptom check sheet.   Row NMenard 12/16/18 0643           ITP Comments  30 day review completed. Continue with ITP sent to Dr. Mark Emily Filbertical Director of Cardiac and Pulmonary Rehab for review , changes as needed and signature.          Comments:

## 2018-12-17 DIAGNOSIS — I5022 Chronic systolic (congestive) heart failure: Secondary | ICD-10-CM | POA: Diagnosis not present

## 2018-12-17 NOTE — Progress Notes (Signed)
Daily Session Note  Patient Details  Name: Joel [name] MRN: [id number] Date of Birth: 1980-07-06 Referring Provider:     Cardiac Rehab from 11/30/2018 in [facility] Cardiac and Pulmonary Rehab  Referring Provider  Gollan      Encounter Date: 12/17/2018  Check In: Session Check In - 12/17/18 1643      Check-In   Supervising physician immediately available to respond to emergencies  See telemetry face sheet for immediately available ER MD    Location  [facility]-Cardiac & Pulmonary Rehab    Staff Present  Vida Rigger RN, BSN;Joseph [address] [place], [place], ACSM CEP, Exercise Physiologist    Virtual Visit  No    Medication changes reported      No    Fall or balance concerns reported     No    Warm-up and Cool-down  Performed on first and last piece of equipment    Resistance Training Performed  Yes    VAD Patient?  No    PAD/SET Patient?  No      Pain Assessment   Currently in Pain?  No/denies    Multiple Pain Sites  No          Social History   Tobacco Use  Smoking Status Never Smoker  Smokeless Tobacco Never Used    Goals Met:  Independence with exercise equipment Exercise tolerated well No report of cardiac concerns or symptoms Strength training completed today  Goals Unmet:  Not Applicable  Comments: Pt able to follow exercise prescription today without complaint.  Will continue to monitor for progression.   Dr. Emily Filbert is Medical Director for [place] and [facility].

## 2018-12-21 ENCOUNTER — Other Ambulatory Visit: Payer: Self-pay

## 2018-12-21 ENCOUNTER — Encounter: Payer: Medicaid Other | Admitting: *Deleted

## 2018-12-21 DIAGNOSIS — I5022 Chronic systolic (congestive) heart failure: Secondary | ICD-10-CM | POA: Diagnosis not present

## 2018-12-21 NOTE — Progress Notes (Signed)
Daily Session Note  Patient Details  Name: Joel [name] MRN: [id number] Date of Birth: [birth year] Referring Provider:     Cardiac Rehab from 11/30/2018 in [facility] Cardiac and Pulmonary Rehab  Referring Provider  Gollan      Encounter Date: 12/21/2018  Check In: Session Check In - 12/21/18 1626      Check-In   Supervising physician immediately available to respond to emergencies  See telemetry face sheet for immediately available ER MD    Location  [facility]-Cardiac & Pulmonary Rehab    Staff Present  Renita Papa, RN BSN;Jeanna Durrell BS, Exercise Physiologist;Kelly Amedeo Plenty, BS, ACSM CEP, Exercise Physiologist    Virtual Visit  No    Medication changes reported      No    Fall or balance concerns reported     No    Warm-up and Cool-down  Performed on first and last piece of equipment    Resistance Training Performed  Yes    VAD Patient?  No    PAD/SET Patient?  No      Pain Assessment   Currently in Pain?  No/denies          Social History   Tobacco Use  Smoking Status Never Smoker  Smokeless Tobacco Never Used    Goals Met:  Independence with exercise equipment Exercise tolerated well No report of cardiac concerns or symptoms Strength training completed today  Goals Unmet:  Not Applicable  Comments: Pt able to follow exercise prescription today without complaint.  Will continue to monitor for progression.    Dr. Emily Filbert is Medical Director for [place] and [facility].

## 2018-12-23 ENCOUNTER — Encounter: Payer: Medicaid Other | Admitting: *Deleted

## 2018-12-23 ENCOUNTER — Other Ambulatory Visit: Payer: Self-pay

## 2018-12-23 DIAGNOSIS — I5022 Chronic systolic (congestive) heart failure: Secondary | ICD-10-CM | POA: Diagnosis not present

## 2018-12-23 NOTE — Progress Notes (Signed)
Daily Session Note  Patient Details  Name: CORNELIS KLUVER MRN: [id number] Date of Birth: Feb 28, 1980 Referring Provider:     Cardiac Rehab from 11/30/2018 in [facility] Cardiac and Pulmonary Rehab  Referring Provider  Gollan      Encounter Date: 12/23/2018  Check In: Session Check In - 12/23/18 1629      Check-In   Supervising physician immediately available to respond to emergencies  See telemetry face sheet for immediately available ER MD    Location  [facility]-Cardiac & Pulmonary Rehab    Staff Present  Renita Papa, RN BSN;Jeanna Durrell BS, Exercise Physiologist;Joseph Tessie Fass RCP,RRT,BSRT    Virtual Visit  No    Medication changes reported      No    Fall or balance concerns reported     No    Warm-up and Cool-down  Performed on first and last piece of equipment    Resistance Training Performed  Yes    VAD Patient?  No    PAD/SET Patient?  No      Pain Assessment   Currently in Pain?  No/denies          Social History   Tobacco Use  Smoking Status Never Smoker  Smokeless Tobacco Never Used    Goals Met:  Independence with exercise equipment Exercise tolerated well No report of cardiac concerns or symptoms Strength training completed today  Goals Unmet:  Not Applicable  Comments: Pt able to follow exercise prescription today without complaint.  Will continue to monitor for progression.    Dr. Emily Filbert is Medical Director for [place] and [facility].

## 2018-12-24 ENCOUNTER — Encounter: Payer: Medicaid Other | Admitting: *Deleted

## 2018-12-24 DIAGNOSIS — I5022 Chronic systolic (congestive) heart failure: Secondary | ICD-10-CM

## 2018-12-24 NOTE — Progress Notes (Signed)
Daily Session Note  Patient Details  Name: Joel [name] MRN: [id number] Date of Birth: Mar 25, 1980 Referring Provider:     Cardiac Rehab from 11/30/2018 in [facility] Cardiac and Pulmonary Rehab  Referring Provider  Gollan      Encounter Date: 12/24/2018  Check In: Session Check In - 12/24/18 1638      Check-In   Supervising physician immediately available to respond to emergencies  See telemetry face sheet for immediately available ER MD    Location  [facility]-Cardiac & Pulmonary Rehab    Staff Present  Renita Papa, RN Moises Blood, BS, ACSM CEP, Exercise Physiologist;Joseph Tessie Fass RCP,RRT,BSRT    Virtual Visit  No    Medication changes reported      No    Fall or balance concerns reported     No    Warm-up and Cool-down  Performed on first and last piece of equipment    Resistance Training Performed  Yes    VAD Patient?  No    PAD/SET Patient?  No      Pain Assessment   Currently in Pain?  No/denies          Social History   Tobacco Use  Smoking Status Never Smoker  Smokeless Tobacco Never Used    Goals Met:  Independence with exercise equipment Exercise tolerated well No report of cardiac concerns or symptoms Strength training completed today  Goals Unmet:  Not Applicable  Comments: Pt able to follow exercise prescription today without complaint.  Will continue to monitor for progression.    Dr. Emily Filbert is Medical Director for [place] and [facility].

## 2018-12-25 NOTE — Progress Notes (Signed)
Patient ID: Joel [name], male    DOB: 08-04-1980, 38 y.o.   MRN: [id number]  HPI  Joel [name] is a 38 y/o male with a history of CKD, DM, HTN and chronic heart failure.   Echo report from 02/12/17 reviewed and showed an EF of 25-30% along with mild Joel. EF has declined from 2016.  Has not been admitted or been in the ED in the last 6 months.  He presents today for a follow-up visit with a chief complaint of minimal shortness of breath upon moderate exertion. He describes this as chronic in nature having been present for several years. He has associated fatigue, light-headedness and chronic blurry vision along with this. He denies any difficulty sleeping, cough, chest pain, pedal edema, palpitations, abdominal distention or weight gain.    Past Medical History:  Diagnosis Date  . Anxiety   . Chronic diastolic CHF (congestive heart failure) ([place])   . Hyperlipidemia   . Hypertension   . Hypertensive heart disease   . Noncompliance with medications   . Obesity   . Stroke ([place])   . Systolic dysfunction    a. TTE 06/2014: EF 45-50%, normal wall motion, mild Joel, mild AI   Past Surgical History:  Procedure Laterality Date  . NO PAST SURGERIES     Family History  Problem Relation Age of Onset  . Hypertension Mother   . Hyperlipidemia Mother   . Diabetes Mother   . CAD Father        a. stenting in his early 47s  . Heart disease Father   . Stroke Father   . Diabetes Maternal Grandmother   . Diabetes Maternal Grandfather   . CAD Paternal Grandmother   . CAD Paternal Grandfather   . Colon cancer Paternal Grandfather   . Diabetes Paternal Uncle    Social History   Tobacco Use  . Smoking status: Never Smoker  . Smokeless tobacco: Never Used  Substance Use Topics  . Alcohol use: Not Currently    Frequency: Never    Comment: Very infrequently; ~2-3 drinks/year    Allergies  Allergen Reactions  . Bidil [Isosorb Dinitrate-Hydralazine] Other (See Comments)    Severe headache  .  Nitroglycerin Other (See Comments)    Causes severe headaches   Prior to Admission medications   Medication Sig Start Date End Date Taking? Authorizing Provider  amLODipine (NORVASC) 5 MG tablet Take 1 tablet (5 mg total) by mouth daily. 02/23/18  Yes Alisa Graff, FNP  aspirin 81 MG chewable tablet Chew 1 tablet (81 mg total) by mouth daily. 10/13/14  Yes Hillary Bow, MD  atorvastatin (LIPITOR) 80 MG tablet Take 1 tablet (80 mg total) by mouth daily at 6 PM. 02/23/18  Yes Darylene Price A, FNP  carvedilol (COREG) 25 MG tablet Take 1 tablet (25 mg total) by mouth 2 (two) times daily with a meal. 02/23/18  Yes Finlee Milo A, FNP  empagliflozin (JARDIANCE) 25 MG TABS tablet Take by mouth. 10/15/18  Yes [provider]  esomeprazole (NEXIUM) 20 MG capsule Take 20 mg by mouth daily at 12 noon.   Yes [provider]  HUMALOG 100 UNIT/ML injection 17 units at breakfast 13 units at lunch 25 units at dinner 11/09/18  Yes [provider]  insulin aspart (NOVOLOG) 100 UNIT/ML injection Inject 5 Units into the skin 3 (three) times daily with meals. 02/14/17  Yes Dustin Flock, MD  insulin detemir (LEVEMIR) 100 UNIT/ML injection Inject 0.45 mLs (45 Units  total) into the skin 2 (two) times daily. 05/08/18  Yes Karamalegos, Alexander J, DO  liraglutide (VICTOZA) 18 MG/3ML SOPN Inject into the skin. 11/04/18 11/04/19 Yes [provider]  sacubitril-valsartan (ENTRESTO) 97-103 MG Take 1 tablet by mouth 2 (two) times daily. 08/22/18  Yes Marston Mccadden, [name], FNP  spironolactone (ALDACTONE) 25 MG tablet Take 25 mg by mouth daily.   Yes [provider]  BYDUREON 2 MG PEN Inject 2 mg into the skin once a week. Patient not taking: Reported on 12/28/2018 01/02/18   [name], DO  furosemide (LASIX) 20 MG tablet Take 20 mg by mouth as needed.    [provider]     Review of Systems  Constitutional: Positive for fatigue (at times). Negative for appetite  change.  HENT: Negative for congestion, postnasal drip and sore throat.   Eyes: Positive for visual disturbance (blurry vision).  Respiratory: Positive for shortness of breath. Negative for cough and chest tightness.   Cardiovascular: Negative for chest pain, palpitations and leg swelling.  Gastrointestinal: Negative for abdominal distention and abdominal pain.  Endocrine: Negative.   Genitourinary: Negative.   Musculoskeletal: Negative for back pain and neck pain.  Skin: Negative.   Allergic/Immunologic: Negative.   Neurological: Positive for light-headedness (at times). Negative for dizziness and headaches.  Hematological: Negative for adenopathy. Does not bruise/bleed easily.  Psychiatric/Behavioral: Negative for dysphoric mood and sleep disturbance. The patient is not nervous/anxious.    Vitals:   12/28/18 1534  BP: (!) 147/97  Pulse: 90  Resp: 20  SpO2: 98%  Weight: (!) 329 lb (149.2 kg)  Height: 5\' 7"  (1.702 m)   Wt Readings from Last 3 Encounters:  12/28/18 (!) 329 lb (149.2 kg)  11/30/18 (!) 335 lb 12.8 oz (152.3 kg)  09/14/18 (!) 340 lb 8 oz (154.4 kg)   Lab Results  Component Value Date   CREATININE 1.77 (H) 01/28/2018   CREATININE 2.14 (H) 12/15/2017   CREATININE 1.71 (H) 11/12/2017     Physical Exam  Constitutional: He is oriented to person, place, and time. He appears well-developed and well-nourished.  HENT:  Head: Normocephalic and atraumatic.  Neck: Normal range of motion. Neck supple. No JVD present.  Cardiovascular: Normal rate and regular rhythm.  Pulmonary/Chest: Effort normal. He has no wheezes. He has no rales.  Abdominal: Soft. He exhibits no distension. There is no abdominal tenderness.  Musculoskeletal:        General: No tenderness or edema.  Neurological: He is alert and oriented to person, place, and time.  Skin: Skin is warm and dry.  Psychiatric: He has a normal mood and affect. His behavior is normal. Thought content normal.  Nursing  note and vitals reviewed.   Assessment & Plan:  1: Chronic heart failure with reduced ejection fraction- - NYHA class II - euvolemic today - weighing daily and says that his weight has declined. Reminded to call for an overnight weight gain of >2 pounds or a weekly weight gain of >5 pounds - weight down 11 pounds since last here 3 months ago - participating in cardiac rehab along with attending a local gym numerous days/ week - using [employer] for seasoning & has been reading food labels for sodium content. Encouraged to keep daily sodium intake to 2000mg  daily - unable to tolerate bidil as it gave him a headache - BNP on 02/12/17 was 309.0 - had telemedicine visit with cardiologist Mariah Milling) 05/21/2018  2: HTN- - BP mildly elevated today - BMP  from 10/14/2018 was reviewed and showed sodium 132, potassium 4.3, creatinine 2.0 and GFR 46 - saw PCP Althea Charon) 03/20/2018  3: Diabetes- - glucose at home was 91 this morning - A1c on 10/14/2018 was 11.4% - saw endocrinology Gershon Crane) 10/13/2018  Patient did not bring his medications nor a list. Each medication was verbally reviewed with the patient and he was encouraged to bring the bottles to every visit to confirm accuracy of list.  Will not make patient another appointment at this time. Advised him and his wife that they could call back at anytime to make another appointment.

## 2018-12-28 ENCOUNTER — Encounter: Payer: Medicaid Other | Admitting: *Deleted

## 2018-12-28 ENCOUNTER — Other Ambulatory Visit: Payer: Self-pay

## 2018-12-28 ENCOUNTER — Encounter: Payer: Self-pay | Admitting: Family

## 2018-12-28 ENCOUNTER — Ambulatory Visit: Payer: Medicaid Other | Attending: Family | Admitting: Family

## 2018-12-28 VITALS — BP 147/97 | HR 90 | Resp 20 | Ht 67.0 in | Wt 329.0 lb

## 2018-12-28 DIAGNOSIS — N189 Chronic kidney disease, unspecified: Secondary | ICD-10-CM | POA: Insufficient documentation

## 2018-12-28 DIAGNOSIS — Z7982 Long term (current) use of aspirin: Secondary | ICD-10-CM | POA: Insufficient documentation

## 2018-12-28 DIAGNOSIS — N1831 Chronic kidney disease, stage 3a: Secondary | ICD-10-CM

## 2018-12-28 DIAGNOSIS — E785 Hyperlipidemia, unspecified: Secondary | ICD-10-CM | POA: Diagnosis not present

## 2018-12-28 DIAGNOSIS — Z79899 Other long term (current) drug therapy: Secondary | ICD-10-CM | POA: Insufficient documentation

## 2018-12-28 DIAGNOSIS — I5042 Chronic combined systolic (congestive) and diastolic (congestive) heart failure: Secondary | ICD-10-CM | POA: Insufficient documentation

## 2018-12-28 DIAGNOSIS — E1021 Type 1 diabetes mellitus with diabetic nephropathy: Secondary | ICD-10-CM

## 2018-12-28 DIAGNOSIS — Z794 Long term (current) use of insulin: Secondary | ICD-10-CM | POA: Insufficient documentation

## 2018-12-28 DIAGNOSIS — I5022 Chronic systolic (congestive) heart failure: Secondary | ICD-10-CM

## 2018-12-28 DIAGNOSIS — Z888 Allergy status to other drugs, medicaments and biological substances status: Secondary | ICD-10-CM | POA: Diagnosis not present

## 2018-12-28 DIAGNOSIS — Z8673 Personal history of transient ischemic attack (TIA), and cerebral infarction without residual deficits: Secondary | ICD-10-CM | POA: Diagnosis not present

## 2018-12-28 DIAGNOSIS — Z833 Family history of diabetes mellitus: Secondary | ICD-10-CM | POA: Insufficient documentation

## 2018-12-28 DIAGNOSIS — Z8249 Family history of ischemic heart disease and other diseases of the circulatory system: Secondary | ICD-10-CM | POA: Insufficient documentation

## 2018-12-28 DIAGNOSIS — E1122 Type 2 diabetes mellitus with diabetic chronic kidney disease: Secondary | ICD-10-CM | POA: Diagnosis not present

## 2018-12-28 DIAGNOSIS — E669 Obesity, unspecified: Secondary | ICD-10-CM | POA: Diagnosis not present

## 2018-12-28 DIAGNOSIS — I130 Hypertensive heart and chronic kidney disease with heart failure and stage 1 through stage 4 chronic kidney disease, or unspecified chronic kidney disease: Secondary | ICD-10-CM | POA: Insufficient documentation

## 2018-12-28 DIAGNOSIS — I10 Essential (primary) hypertension: Secondary | ICD-10-CM

## 2018-12-28 NOTE — Patient Instructions (Addendum)
Continue weighing daily and call for an overnight weight gain of > 2 pounds or a weekly weight gain of >5 pounds.  Call us to make a future appointment at anytime

## 2018-12-28 NOTE — Progress Notes (Signed)
Daily Session Note  Patient Details  Name: Joel [name] MRN: [id number] Date of Birth: March 13, 1980 Referring Provider:     Cardiac Rehab from 11/30/2018 in [facility] Cardiac and Pulmonary Rehab  Referring Provider  Gollan      Encounter Date: 12/28/2018  Check In: Session Check In - 12/28/18 1603      Check-In   Supervising physician immediately available to respond to emergencies  See telemetry face sheet for immediately available ER MD    Location  [facility]-Cardiac & Pulmonary Rehab    Staff Present  Renita Papa, RN Moises Blood, BS, ACSM CEP, Exercise Physiologist;Joseph Tessie Fass RCP,RRT,BSRT    Virtual Visit  No    Medication changes reported      No    Fall or balance concerns reported     No    Warm-up and Cool-down  Performed on first and last piece of equipment    Resistance Training Performed  Yes    VAD Patient?  No    PAD/SET Patient?  No      Pain Assessment   Currently in Pain?  No/denies          Social History   Tobacco Use  Smoking Status Never Smoker  Smokeless Tobacco Never Used    Goals Met:  Independence with exercise equipment Exercise tolerated well Personal goals reviewed No report of cardiac concerns or symptoms Strength training completed today  Goals Unmet:  Not Applicable  Comments: Pt able to follow exercise prescription today without complaint.  Will continue to monitor for progression.    Dr. Emily Filbert is Medical Director for [place] and [facility].

## 2018-12-29 ENCOUNTER — Encounter: Payer: Self-pay | Admitting: Family

## 2018-12-30 ENCOUNTER — Other Ambulatory Visit: Payer: Self-pay

## 2018-12-30 ENCOUNTER — Encounter: Payer: Medicaid Other | Admitting: *Deleted

## 2018-12-30 DIAGNOSIS — I5022 Chronic systolic (congestive) heart failure: Secondary | ICD-10-CM

## 2018-12-30 NOTE — Progress Notes (Signed)
Daily Session Note  Patient Details  Name: Joel [name] MRN: [id number] Date of Birth: 06-03-1980 Referring Provider:     Cardiac Rehab from 11/30/2018 in [facility] Cardiac and Pulmonary Rehab  Referring Provider  Gollan      Encounter Date: 12/30/2018  Check In: Session Check In - 12/30/18 1637      Check-In   Supervising physician immediately available to respond to emergencies  See telemetry face sheet for immediately available ER MD    Location  [facility]-Cardiac & Pulmonary Rehab    Staff Present  Renita Papa, RN Vickki Hearing, BA, ACSM CEP, Exercise Physiologist;Melissa Caiola RDN, LDN    Virtual Visit  No    Medication changes reported      No    Fall or balance concerns reported     No    Warm-up and Cool-down  Performed on first and last piece of equipment    Resistance Training Performed  Yes    VAD Patient?  No    PAD/SET Patient?  No      Pain Assessment   Currently in Pain?  No/denies          Social History   Tobacco Use  Smoking Status Never Smoker  Smokeless Tobacco Never Used    Goals Met:  Independence with exercise equipment Exercise tolerated well No report of cardiac concerns or symptoms Strength training completed today  Goals Unmet:  Not Applicable  Comments: Pt able to follow exercise prescription today without complaint.  Will continue to monitor for progression.    Dr. Emily Filbert is Medical Director for [place] and [facility].

## 2018-12-31 ENCOUNTER — Encounter: Payer: Medicaid Other | Admitting: *Deleted

## 2018-12-31 DIAGNOSIS — I5022 Chronic systolic (congestive) heart failure: Secondary | ICD-10-CM | POA: Diagnosis not present

## 2018-12-31 NOTE — Progress Notes (Signed)
Daily Session Note  Patient Details  Name: Joel [name] MRN: [id number] Date of Birth: 05/28/1980 Referring Provider:     Cardiac Rehab from 11/30/2018 in [facility]  Referring Provider  Gollan      Encounter Date: 12/31/2018  Check In: Session Check In - 12/31/18 1700      Check-In   Supervising physician immediately available to respond to emergencies  See telemetry face sheet for immediately available ER MD    Location  [facility]-Cardiac & Pulmonary Rehab    Staff Present  Renita Papa, RN BSN;Jeanna Durrell BS, Exercise Physiologist;Kelly Amedeo Plenty, BS, ACSM CEP, Exercise Physiologist    Virtual Visit  No    Medication changes reported      No    Fall or balance concerns reported     No    Warm-up and Cool-down  Performed on first and last piece of equipment    Resistance Training Performed  Yes    VAD Patient?  No    PAD/SET Patient?  No      Pain Assessment   Currently in Pain?  No/denies          Social History   Tobacco Use  Smoking Status Never Smoker  Smokeless Tobacco Never Used    Goals Met:  Independence with exercise equipment Exercise tolerated well No report of cardiac concerns or symptoms Strength training completed today  Goals Unmet:  Not Applicable  Comments: Pt able to follow exercise prescription today without complaint.  Will continue to monitor for progression.    Dr. Emily Filbert is Medical Director for [place] and [facility].

## 2019-01-04 ENCOUNTER — Encounter: Payer: Medicaid Other | Admitting: *Deleted

## 2019-01-04 ENCOUNTER — Other Ambulatory Visit: Payer: Self-pay

## 2019-01-04 DIAGNOSIS — I5022 Chronic systolic (congestive) heart failure: Secondary | ICD-10-CM

## 2019-01-04 NOTE — Progress Notes (Signed)
Daily Session Note  Patient Details  Name: Joel [name] MRN: [id number] Date of Birth: [birth year] Referring Provider:     Cardiac Rehab from 11/30/2018 in [facility] Card[facility] and [facility] Re[facility]  Referring Provider  Gollan      Encounter Date: 01/04/2019  Check In: Session Check In - 01/04/19 1636      Check-In   Supervising physician immediately available to respond to emergencies  See telemetry face sheet for immediately available ER MD    Location  [facility]    Staff Present  Renita Papa, RN Moises Blood, BS, ACSM CEP, Exercise Physiologist;Jeanna Durrell BS, Exercise Physiologist    Virtual Visit  No    Medication changes reported      No    Fall or balance concerns reported     No    Warm-up and Cool-down  Performed on first and last piece of equipment    Resistance Training Performed  Yes    VAD Patient?  No    PAD/SET Patient?  No      Pain Assessment   Currently in Pain?  No/denies          Social History   Tobacco Use  Smoking Status Never Smoker  Smokeless Tobacco Never Used    Goals Met:  Independence with exercise equipment Exercise tolerated well No report of cardiac concerns or symptoms Strength training completed today  Goals Unmet:  Not Applicable  Comments: Pt able to follow exercise prescription today without complaint.  Will continue to monitor for progression.    Dr. Emily Filbert is Medical Director for [place] and [facility].

## 2019-01-06 ENCOUNTER — Other Ambulatory Visit: Payer: Self-pay

## 2019-01-06 ENCOUNTER — Encounter: Payer: Medicaid Other | Admitting: *Deleted

## 2019-01-06 DIAGNOSIS — I5022 Chronic systolic (congestive) heart failure: Secondary | ICD-10-CM | POA: Diagnosis not present

## 2019-01-06 NOTE — Progress Notes (Signed)
Daily Session Note  Patient Details  Name: Joel [name] MRN: [id number] Date of Birth: 11/20/1980 Referring Provider:     Cardiac Rehab from 11/30/2018 in [facility] Re[facility]  Referring Provider  Gollan      Encounter Date: 01/06/2019  Check In: Session Check In - 01/06/19 1644      Check-In   Supervising physician immediately available to respond to emergencies  See telemetry face sheet for immediately available ER MD    Location  [facility]    Staff Present  Meredith Craven, RN BSN;Amanda Sommer, BA, ACSM CEP, Exercise Physiologist;Melissa Caiola RDN, LDN    Virtual Visit  No    Medication changes reported      No    Fall or balance concerns reported     No    Warm-up and Cool-down  Performed on first and last piece of equipment    Resistance Training Performed  Yes    VAD Patient?  No    PAD/SET Patient?  No      Pain Assessment   Currently in Pain?  No/denies          Social History   Tobacco Use  Smoking Status Never Smoker  Smokeless Tobacco Never Used    Goals Met:  Independence with exercise equipment Exercise tolerated well No report of cardiac concerns or symptoms Strength training completed today  Goals Unmet:  Not Applicable  Comments: Pt able to follow exercise prescription today without complaint.  Will continue to monitor for progression.    Dr. Mark Miller is Medical Director for [facility] and [facility]. 

## 2019-01-11 ENCOUNTER — Other Ambulatory Visit: Payer: Self-pay

## 2019-01-11 ENCOUNTER — Encounter: Payer: Medicaid Other | Admitting: *Deleted

## 2019-01-11 DIAGNOSIS — I5022 Chronic systolic (congestive) heart failure: Secondary | ICD-10-CM | POA: Diagnosis not present

## 2019-01-11 NOTE — Progress Notes (Signed)
Daily Session Note  Patient Details  Name: Joel [name] MRN: [id number] Date of Birth: Feb 17, 1980 Referring Provider:     Cardiac Rehab from 11/30/2018 in [facility] Card[facility] and [facility] Rehab  Referring Provider  Gollan      Encounter Date: 01/11/2019  Check In: Session Check In - 01/11/19 1636      Check-In   Supervising physician immediately available to respond to emergencies  See telemetry face sheet for immediately available ER MD    Location  [facility]    Staff Present  Renita Papa, RN Moises Blood, BS, ACSM CEP, Exercise Physiologist;Joseph Tessie Fass RCP,RRT,BSRT    Virtual Visit  No    Medication changes reported      No    Fall or balance concerns reported     No    Warm-up and Cool-down  Performed on first and last piece of equipment    Resistance Training Performed  Yes    VAD Patient?  No    PAD/SET Patient?  No      Pain Assessment   Currently in Pain?  No/denies          Social History   Tobacco Use  Smoking Status Never Smoker  Smokeless Tobacco Never Used    Goals Met:  Independence with exercise equipment Exercise tolerated well No report of cardiac concerns or symptoms Strength training completed today  Goals Unmet:  Not Applicable  Comments: Pt able to follow exercise prescription today without complaint.  Will continue to monitor for progression.    Dr. Emily Filbert is Medical Director for [place] and [facility].

## 2019-01-13 ENCOUNTER — Encounter: Payer: Medicaid Other | Attending: Cardiovascular Disease | Admitting: *Deleted

## 2019-01-13 ENCOUNTER — Encounter: Payer: Self-pay | Admitting: *Deleted

## 2019-01-13 ENCOUNTER — Other Ambulatory Visit: Payer: Self-pay

## 2019-01-13 DIAGNOSIS — I5022 Chronic systolic (congestive) heart failure: Secondary | ICD-10-CM

## 2019-01-13 NOTE — Progress Notes (Signed)
Cardiac Individual Treatment Plan  Patient Details  Name: Joel [name] MRN: [id number] Date of Birth: 07/14/1980 Referring Provider:     Cardiac Rehab from 11/30/2018 in [facility] Cardiac [facility] Pulmonary Rehab  Referring Provider  Gollan      Initial Encounter Date:    Cardiac Rehab from 11/30/2018 in [facility] Cardiac and Pulmonary Rehab  Date  11/30/18      Visit Diagnosis: Heart failure, chronic systolic ([place])  Patient's Home Medications on Admission:  Current Outpatient Medications:    amLODipine (NORVASC) 5 MG tablet, Take 1 tablet (5 mg total) by mouth daily., Disp: 90 tablet, Rfl: 3   aspirin 81 MG chewable tablet, Chew 1 tablet (81 mg total) by mouth daily., Disp: , Rfl:    atorvastatin (LIPITOR) 80 MG tablet, Take 1 tablet (80 mg total) by mouth daily at 6 PM., Disp: 90 tablet, Rfl: 3   BYDUREON 2 MG PEN, Inject 2 mg into the skin once a week. (Patient not taking: Reported on 12/28/2018), Disp: 4 each, Rfl: 2   carvedilol (COREG) 25 MG tablet, Take 1 tablet (25 mg total) by mouth 2 (two) times daily with a meal., Disp: 180 tablet, Rfl: 3   empagliflozin (JARDIANCE) 25 MG TABS tablet, Take by mouth., Disp: , Rfl:    esomeprazole (NEXIUM) 20 MG capsule, Take 20 mg by mouth daily at 12 noon., Disp: , Rfl:    furosemide (LASIX) 20 MG tablet, Take 20 mg by mouth as needed., Disp: , Rfl:    HUMALOG 100 UNIT/ML injection, 17 units at breakfast 13 units at lunch 25 units at dinner, Disp: , Rfl:    insulin aspart (NOVOLOG) 100 UNIT/ML injection, Inject 5 Units into the skin 3 (three) times daily with meals., Disp: 10 mL, Rfl: 11   insulin detemir (LEVEMIR) 100 UNIT/ML injection, Inject 0.45 mLs (45 Units total) into the skin 2 (two) times daily., Disp: 20 mL, Rfl: 2   liraglutide (VICTOZA) 18 MG/3ML SOPN, Inject into the skin., Disp: , Rfl:    sacubitril-valsartan (ENTRESTO) 97-103 MG, Take 1 tablet by mouth 2 (two) times daily., Disp: 180 tablet, Rfl: 3   spironolactone  (ALDACTONE) 25 MG tablet, Take 25 mg by mouth daily., Disp: , Rfl:   Past Medical History: Past Medical History:  Diagnosis Date   Anxiety    Chronic diastolic CHF (congestive heart failure) (HCC)    Hyperlipidemia    Hypertension    Hypertensive heart disease    Noncompliance with medications    Obesity    Stroke ([place])    Systolic dysfunction    a. TTE 06/2014: EF 45-50%, normal wall motion, mild MR, mild AI    Tobacco Use: Social History   Tobacco Use  Smoking Status Never Smoker  Smokeless Tobacco Never Used    Labs: Recent Review Flowsheet Data    Labs for ITP Cardiac and Pulmonary Rehab Latest Ref Rng & Units 06/11/2014 10/11/2014 02/12/2017 02/13/2017 12/15/2017   Cholestrol 0 - 200 mg/dL 361(H) - - 435(H) -   LDLCALC 0 - 99 mg/dL 271(H) - - 352(H) -   HDL >40 mg/dL 41 - - 30(L) -   Trlycerides <150 mg/dL 246(H) - - 267(H) -   Hemoglobin A1c 4.8 - 5.6 % - 7.3(H) 14.7(H) - 13.3(H)       Exercise Target Goals: Exercise Program Goal: Individual exercise prescription set using results from initial 6 min walk test and THRR while considering  patients activity barriers and safety.   Exercise Prescription  Goal: Initial exercise prescription builds to 30-45 minutes a day of aerobic activity, 2-3 days per week.  Home exercise guidelines will be given to patient during program as part of exercise prescription that the participant will acknowledge.  Activity Barriers & Risk Stratification: Activity Barriers & Cardiac Risk Stratification - 11/13/18 1313      Activity Barriers & Cardiac Risk Stratification   Activity Barriers  None    Cardiac Risk Stratification  High       6 Minute Walk: 6 Minute Walk    Row Name 11/30/18 1431         6 Minute Walk   Phase  Initial     Distance  1120 feet     Walk Time  6 minutes     # of Rest Breaks  0     MPH  2.12     METS  3.1     RPE  9     Perceived Dyspnea   1     VO2 Peak  10.8     Symptoms  No     Resting HR   97 bpm     Resting BP  124/84     Resting Oxygen Saturation   97 %     Exercise Oxygen Saturation  during 6 min walk  99 %     Max Ex. HR  114 bpm     Max Ex. BP  126/88     2 Minute Post BP  122/84        Oxygen Initial Assessment:   Oxygen Re-Evaluation:   Oxygen Discharge (Final Oxygen Re-Evaluation):   Initial Exercise Prescription: Initial Exercise Prescription - 11/30/18 1400      Date of Initial Exercise RX and Referring Provider   Date  11/30/18    Referring Provider  Gollan      Treadmill   MPH  2.1    Grade  1.5    Minutes  15    METs  3      Recumbant Bike   Level  3    RPM  60    Watts  53    Minutes  15    METs  3      NuStep   Level  4    SPM  80    Minutes  15    METs  3      Recumbant Elliptical   Level  2    RPM  50    Minutes  15    METs  3      REL-XR   Level  3    Speed  50    Minutes  15    METs  3      T5 Nustep   Level  2    SPM  80    Minutes  15    METs  3      Prescription Details   Frequency (times per week)  3      Intensity   THRR 40-80% of Max Heartrate  131-166    Ratings of Perceived Exertion  11-13    Perceived Dyspnea  0-4      Resistance Training   Training Prescription  Yes    Weight  3 lb    Reps  10-15       Perform Capillary Blood Glucose checks as needed.  Exercise Prescription Changes: Exercise Prescription Changes    Row Name 11/30/18 1400 12/10/18 1400 12/23/18  1200 01/05/19 1400       Response to Exercise   Blood Pressure (Admit)  124/84  136/86  142/88  148/100    Blood Pressure (Exercise)  126/88  148/82  128/60  162/90    Blood Pressure (Exit)  122/84  132/82  122/90  134/92    Heart Rate (Admit)  97 bpm  82 bpm  89 bpm  60 bpm    Heart Rate (Exercise)  114 bpm  129 bpm  133 bpm  132 bpm    Heart Rate (Exit)  99 bpm  122 bpm  111 bpm  121 bpm    Oxygen Saturation (Admit)  97 %  --  --  --    Oxygen Saturation (Exercise)  99 %  --  --  --    Rating of Perceived Exertion  (Exercise)  9  11  13  17     Perceived Dyspnea (Exercise)  1  --  --  --    Symptoms  none  none  none  none    Duration  --  Progress to 30 minutes of  aerobic without signs/symptoms of physical distress  Continue with 30 min of aerobic exercise without signs/symptoms of physical distress.  Continue with 30 min of aerobic exercise without signs/symptoms of physical distress.    Intensity  --  THRR unchanged  THRR unchanged  THRR unchanged      Progression   Progression  --  Continue to progress workloads to maintain intensity without signs/symptoms of physical distress.  Continue to progress workloads to maintain intensity without signs/symptoms of physical distress.  Continue to progress workloads to maintain intensity without signs/symptoms of physical distress.    Average METs  --  2.6  3.35  4.27      Resistance Training   Training Prescription  --  Yes  Yes  Yes    Weight  --  3 lb  3 lb  4 lb    Reps  --  10-15  10-15  10-15      Interval Training   Interval Training  --  --  No  No      Treadmill   MPH  --  2.1  2.1  2.1    Grade  --  1.5  1.5  1.5    Minutes  --  15  15  15     METs  --  3  3.3  3.3      Recumbant Bike   Level  --  --  3  --    Minutes  --  --  15  --    METs  --  --  3  --      NuStep   Level  --  2  --  --    SPM  --  80  --  --    Minutes  --  15  --  --    METs  --  2.2  --  --      REL-XR   Level  --  3  2  6     Speed  --  50  --  --    Minutes  --  15  15  15     METs  --  1.3  4.1  5.5      T5 Nustep   Level  --  --  4  --    Minutes  --  --  15  --  METs  --  --  3.2  --      Home Exercise Plan   Plans to continue exercise at  --  --  [employer] (comment) walking, [employer] (comment) walking, [employer]    Frequency  --  --  Add 2 additional days to program exercise sessions.  Add 2 additional days to program exercise sessions.    Initial Home Exercises Provided  --  --  12/14/18  12/14/18        Exercise Comments: Exercise Comments    Row Name 12/14/18 1708           Exercise Comments  Reviewed home exercise with pt today.  Pt plans to go to [employer] for exercise.  Reviewed THR, pulse, RPE, sign and symptoms, NTG use, and when to call 911 or MD.  Also discussed weather considerations and indoor options.  Pt voiced understanding.          Exercise Goals and Review: Exercise Goals    Row Name 11/30/18 1438             Exercise Goals   Increase Physical Activity  Yes       Intervention  Provide advice, education, support and counseling about physical activity/exercise needs.;Develop an individualized exercise prescription for aerobic and resistive training based on initial evaluation findings, risk stratification, comorbidities and participant's personal goals.       Expected Outcomes  Short Term: Attend rehab on a regular basis to increase amount of physical activity.;Long Term: Add in home exercise to make exercise part of routine and to increase amount of physical activity.;Long Term: Exercising regularly at least 3-5 days a week.       Increase Strength and Stamina  Yes       Intervention  Provide advice, education, support and counseling about physical activity/exercise needs.;Develop an individualized exercise prescription for aerobic and resistive training based on initial evaluation findings, risk stratification, comorbidities and participant's personal goals.       Expected Outcomes  Short Term: Increase workloads from initial exercise prescription for resistance, speed, and METs.;Short Term: Perform resistance training exercises routinely during rehab and add in resistance training at home;Long Term: Improve cardiorespiratory fitness, muscular endurance and strength as measured by increased METs and functional capacity (6MWT)       Able to understand and use rate of perceived exertion (RPE) scale  Yes       Intervention  Provide education and explanation on how  to use RPE scale       Expected Outcomes  Short Term: Able to use RPE daily in rehab to express subjective intensity level;Long Term:  Able to use RPE to guide intensity level when exercising independently       Able to understand and use Dyspnea scale  Yes       Intervention  Provide education and explanation on how to use Dyspnea scale       Expected Outcomes  Short Term: Able to use Dyspnea scale daily in rehab to express subjective sense of shortness of breath during exertion;Long Term: Able to use Dyspnea scale to guide intensity level when exercising independently       Knowledge and understanding of Target Heart Rate Range (THRR)  Yes       Intervention  Provide education and explanation of THRR including how the numbers were predicted and where they are located for reference       Expected Outcomes  Short Term:  Able to state/look up THRR;Short Term: Able to use daily as guideline for intensity in rehab;Long Term: Able to use THRR to govern intensity when exercising independently       Able to check pulse independently  Yes       Intervention  Provide education and demonstration on how to check pulse in carotid and radial arteries.;Review the importance of being able to check your own pulse for safety during independent exercise       Expected Outcomes  Short Term: Able to explain why pulse checking is important during independent exercise;Long Term: Able to check pulse independently and accurately       Understanding of Exercise Prescription  Yes       Intervention  Provide education, explanation, and written materials on patient's individual exercise prescription       Expected Outcomes  Short Term: Able to explain program exercise prescription;Long Term: Able to explain home exercise prescription to exercise independently          Exercise Goals Re-Evaluation : Exercise Goals Re-Evaluation    Row Name 12/02/18 1703 12/10/18 1441 12/14/18 1709 12/23/18 1235 12/28/18 1607     Exercise  Goal Re-Evaluation   Exercise Goals Review  Increase Physical Activity;Able to understand and use rate of perceived exertion (RPE) scale;Knowledge and understanding of Target Heart Rate Range (THRR);Understanding of Exercise Prescription;Increase Strength and Stamina;Able to check pulse independently  Increase Physical Activity;Increase Strength and Stamina;Able to understand and use rate of perceived exertion (RPE) scale;Knowledge and understanding of Target Heart Rate Range (THRR);Able to check pulse independently;Understanding of Exercise Prescription  Increase Physical Activity;Increase Strength and Stamina;Able to understand and use rate of perceived exertion (RPE) scale;Knowledge and understanding of Target Heart Rate Range (THRR);Able to check pulse independently;Understanding of Exercise Prescription  Increase Physical Activity;Increase Strength and Stamina;Understanding of Exercise Prescription  Increase Physical Activity;Increase Strength and Stamina;Understanding of Exercise Prescription   Comments  Reviewed RPE scale, THR and program prescription with pt today.  Pt voiced understanding and was given a copy of goals to take home.  Akiel tolerates exercise well and seems to enjoy sessions.  Reviewed home exercise with pt today.  Pt plans to go to [employer] for exercise.  Reviewed THR, pulse, RPE, sign and symptoms, NTG use, and when to call 911 or MD.  Also discussed weather considerations and indoor options.  Pt voiced understanding.  Plumer has been doing well in rehab.  He is now on level 4 on the T5 NuStep.  He will be trying out the elliptical this week or next.  We will continue to monitor his progress.  --   Expected Outcomes  Short: Use RPE daily to regulate intensity. Long: Follow program prescription in THR.  Short - attend HT consistently Long :  increase MET level  Short - warm up and cool down correctly and check HR while at PF Long - maintain exercise on his own  Short: Try ellipitical  and increase TM.  Long: Continue to walk more and go to [employer] on off days.  --   Row Name 12/28/18 1629 01/05/19 1459           Exercise Goal Re-Evaluation   Exercise Goals Review  Increase Physical Activity;Increase Strength and Stamina;Understanding of Exercise Prescription  Increase Physical Activity;Increase Strength and Stamina;Able to understand and use rate of perceived exertion (RPE) scale;Able to understand and use Dyspnea scale;Knowledge and understanding of Target Heart Rate Range (THRR);Able to check pulse independently;Understanding of Exercise Prescription  Comments  Izaiyah is making progress with exercise levels in class and tried XR machine today for the first time. He is exercising at [employer] on days he does not have rehab. Main goal with exercise continues to be weight loss.  Shamarion continues to improve MET levels on machines and is losing weight slowly.  He has moved up to 4 lb strength training.  Most recent weight was 322 lb      Expected Outcomes  Short: Short term weight goal 325 lbs. Long: Establish an independent exercise routine. Long term weight goal 315 lbs.  Short - continue current program Long 315 lb and maintain exercise         Discharge Exercise Prescription (Final Exercise Prescription Changes): Exercise Prescription Changes - 01/05/19 1400      Response to Exercise   Blood Pressure (Admit)  148/100    Blood Pressure (Exercise)  162/90    Blood Pressure (Exit)  134/92    Heart Rate (Admit)  60 bpm    Heart Rate (Exercise)  132 bpm    Heart Rate (Exit)  121 bpm    Rating of Perceived Exertion (Exercise)  17    Symptoms  none    Duration  Continue with 30 min of aerobic exercise without signs/symptoms of physical distress.    Intensity  THRR unchanged      Progression   Progression  Continue to progress workloads to maintain intensity without signs/symptoms of physical distress.    Average METs  4.27      Resistance Training    Training Prescription  Yes    Weight  4 lb    Reps  10-15      Interval Training   Interval Training  No      Treadmill   MPH  2.1    Grade  1.5    Minutes  15    METs  3.3      REL-XR   Level  6    Minutes  15    METs  5.5      Home Exercise Plan   Plans to continue exercise at  [employer] (comment)   walking, [employer]   Frequency  Add 2 additional days to program exercise sessions.    Initial Home Exercises Provided  12/14/18       Nutrition:  Target Goals: Understanding of nutrition guidelines, daily intake of sodium <1568m, cholesterol <2033m calories 30% from fat and 7% or less from saturated fats, daily to have 5 or more servings of fruits and vegetables.  Biometrics: Pre Biometrics - 11/30/18 1440      Pre Biometrics   Height  5' 8"  (1.727 m)    Weight  (!) 335 lb 12.8 oz (152.3 kg)    BMI (Calculated)  51.07    Single Leg Stand  30 seconds        Nutrition Therapy Plan and Nutrition Goals: Nutrition Therapy & Goals - 12/03/18 1403      Nutrition Therapy   Diet  low Na, HH, DM diet    Protein (specify units)  120g    Fiber  30 grams    Whole Grain Foods  3 servings    Saturated Fats  12 max. grams    Fruits and Vegetables  5 servings/day    Sodium  1.5 grams      Personal Nutrition Goals   Nutrition Goal  ST: eat more consistent meals LT: manage BG, get healthy  Comments  Sometimes skips breakfast, eggs and bacon when do have it. no lunch, sugar free kool aid squirts in water. D: no rice bread and limited potatoes. Meat (chicken, fish, steak) and greens (squash, zucchini, green beans) with gravy sometimes. will normally bake or pan fry with oil. Pt reports having limited appetite after heart event. Hunger cues growling in stomach. Discussed high vs low BG, DM, HH eating and general recommendations like eating consistently to ensure pt gets enough protein and kcal as well as manage BG. Discussed importance of eating and exercise.  Discussed set point weight and health outcomes.      Intervention Plan   Intervention  Prescribe, educate and counsel regarding individualized specific dietary modifications aiming towards targeted core components such as weight, hypertension, lipid management, diabetes, heart failure and other comorbidities.;Nutrition handout(s) given to patient.    Expected Outcomes  Short Term Goal: Understand basic principles of dietary content, such as calories, fat, sodium, cholesterol and nutrients.;Short Term Goal: A plan has been developed with personal nutrition goals set during dietitian appointment.;Long Term Goal: Adherence to prescribed nutrition plan.       Nutrition Assessments: Nutrition Assessments - 12/01/18 1410      MEDFICTS Scores   Pre Score  48       Nutrition Goals Re-Evaluation: Nutrition Goals Re-Evaluation    Row Name 01/06/19 1640             Goals   Nutrition Goal  ST: add CHO before workout LT: manage BG, get healthy       Comment  Tuna after workouts. CHOs earlier in the day. Eats before 7pm. Energy been good, will feel faint when working out discussed adding a CHO before workout. Pt reports loving vegetables.       Expected Outcome  ST: add CHO before workout LT: manage BG, get healthy          Nutrition Goals Discharge (Final Nutrition Goals Re-Evaluation): Nutrition Goals Re-Evaluation - 01/06/19 1640      Goals   Nutrition Goal  ST: add CHO before workout LT: manage BG, get healthy    Comment  Tuna after workouts. CHOs earlier in the day. Eats before 7pm. Energy been good, will feel faint when working out discussed adding a CHO before workout. Pt reports loving vegetables.    Expected Outcome  ST: add CHO before workout LT: manage BG, get healthy       Psychosocial: Target Goals: Acknowledge presence or absence of significant depression and/or stress, maximize coping skills, provide positive support system. Participant is able to verbalize types and  ability to use techniques and skills needed for reducing stress and depression.   Initial Review & Psychosocial Screening: Initial Psych Review & Screening - 11/13/18 1311      Initial Review   Current issues with  Current Stress Concerns    Source of Stress Concerns  Chronic Illness;Unable to participate in former interests or hobbies;Unable to perform yard/household activities    Comments  Gurtej is struggling with the issues related to his HF, like SOB and fatigue. He has a great support system and his wife helps him with managing his medications. He is looking forward to starting HT so he can gain back some control in his life      [place]?  Yes      Barriers   Psychosocial barriers to participate in program  There are no identifiable barriers or psychosocial needs.;The patient should  benefit from training in stress management and relaxation.      Screening Interventions   Interventions  Encouraged to exercise;Provide feedback about the scores to participant;To provide support and resources with identified psychosocial needs    Expected Outcomes  Short Term goal: Utilizing psychosocial counselor, staff and physician to assist with identification of specific Stressors or current issues interfering with healing process. Setting desired goal for each stressor or current issue identified.;Long Term Goal: Stressors or current issues are controlled or eliminated.;Long Term goal: The participant improves quality of Life and PHQ9 Scores as seen by post scores and/or verbalization of changes;Short Term goal: Identification and review with participant of any Quality of Life or Depression concerns found by scoring the questionnaire.       Quality of Life Scores:  Quality of Life - 11/30/18 1448      Quality of Life   Select  Quality of Life      Quality of Life Scores   Health/Function Pre  6.93 %    Socioeconomic Pre  13.71 %    Psych/Spiritual Pre  5.42 %     Family Pre  16.1 %    GLOBAL Pre  9.48 %      Scores of 19 and below usually indicate a poorer quality of life in these areas.  A difference of  2-3 points is a clinically meaningful difference.  A difference of 2-3 points in the total score of the Quality of Life Index has been associated with significant improvement in overall quality of life, self-image, physical symptoms, and general health in studies assessing change in quality of life.  PHQ-9: Recent Review Flowsheet Data    Depression screen [facility] 2/9 11/30/2018 03/22/2018 12/19/2017 12/15/2017 11/12/2017   Decreased Interest 2 3 0 0 0   Down, Depressed, Hopeless 3 2 0 0 1   PHQ - 2 Score 5 5 0 0 1   Altered sleeping 1 1 0 - -   Tired, decreased energy 3 3 0 - -   Change in appetite 3 3 0 - -   Feeling bad or failure about yourself  3 3 0 - -   Trouble concentrating 3 1 0 - -   Moving slowly or fidgety/restless 1 2 0 - -   Suicidal thoughts 3  2 0 - -   PHQ-9 Score 22 20 0 - -   Difficult doing work/chores Extremely dIfficult Extremely dIfficult Not difficult at all - -     Interpretation of Total Score  Total Score Depression Severity:  1-4 = Minimal depression, 5-9 = Mild depression, 10-14 = Moderate depression, 15-19 = Moderately severe depression, 20-27 = Severe depression   Psychosocial Evaluation and Intervention: Psychosocial Evaluation - 11/13/18 1318      Psychosocial Evaluation & Interventions   Comments  Brandun is struggling with the issues related to his HF, like SOB and fatigue. He has a great support system and his wife helps him with managing his medications. He is looking forward to starting HT so he can gain back some control in his life    Expected Outcomes  Short: Attend [facility] to gain confidence in exercising and feel more "like himself". Long: develop self care habits.       Psychosocial Re-Evaluation: Psychosocial Re-Evaluation    [place] Name 12/28/18 1618             Psychosocial Re-Evaluation    Current issues with  History of Depression  Comments  Patient reports he has always struggled some with depression in life but tries not to think too much about troubling things that could drag him down. Currently no reported new depression. Has a strong support system.       Expected Outcomes  Short: Continue to attend cardiac rehab regularly Long: Manage mood with healthy mental health routine and habits.       Continue Psychosocial Services   Follow up required by staff          Psychosocial Discharge (Final Psychosocial Re-Evaluation): Psychosocial Re-Evaluation - 12/28/18 1618      Psychosocial Re-Evaluation   Current issues with  History of Depression    Comments  Patient reports he has always struggled some with depression in life but tries not to think too much about troubling things that could drag him down. Currently no reported new depression. Has a strong support system.    Expected Outcomes  Short: Continue to attend cardiac rehab regularly Long: Manage mood with healthy mental health routine and habits.    Continue Psychosocial Services   Follow up required by staff       Vocational Rehabilitation: Provide vocational rehab assistance to qualifying candidates.   Vocational Rehab Evaluation & Intervention: Vocational Rehab - 11/13/18 1312      Initial Vocational Rehab Evaluation & Intervention   Assessment shows need for Vocational Rehabilitation  No       Education: Education Goals: Education classes will be provided on a variety of topics geared toward better understanding of heart health and risk factor modification. Participant will state understanding/return demonstration of topics presented as noted by education test scores.  Learning Barriers/Preferences: Learning Barriers/Preferences - 11/13/18 1311      Learning Barriers/Preferences   Learning Barriers  None    Learning Preferences  None       Education Topics:  AED/CPR: - Group verbal and written  instruction with the use of models to demonstrate the basic use of the AED with the basic ABC's of resuscitation.   General Nutrition Guidelines/Fats and Fiber: -Group instruction provided by verbal, written material, models and posters to present the general guidelines for heart healthy nutrition. Gives an explanation and review of dietary fats and fiber.   Cardiac Rehab from 12/31/2018 in [facility] Cardiac and Pulmonary Rehab  Date  12/31/18  Educator  [facility]  Instruction Review Code  1- Verbalizes Understanding      Controlling Sodium/Reading Food Labels: -Group verbal and written material supporting the discussion of sodium use in heart healthy nutrition. Review and explanation with models, verbal and written materials for utilization of the food label.   Exercise Physiology & General Exercise Guidelines: - Group verbal and written instruction with models to review the exercise physiology of the cardiovascular system and associated critical values. Provides general exercise guidelines with specific guidelines to those with heart or lung disease.    Aerobic Exercise & Resistance Training: - Gives group verbal and written instruction on the various components of exercise. Focuses on aerobic and resistive training programs and the benefits of this training and how to safely progress through these programs..   Cardiac Rehab from 12/31/2018 in [facility] Cardiac and Pulmonary Rehab  Date  12/03/18  Educator  [facility]  Instruction Review Code  1- Verbalizes Understanding      Flexibility, Balance, Mind/Body Relaxation: Provides group verbal/written instruction on the benefits of flexibility and balance training, including mind/body exercise modes such as yoga, pilates and tai chi.  Demonstration and skill  practice provided.   Cardiac Rehab from 12/31/2018 in [facility] Cardiac and Pulmonary Rehab  Date  12/31/18  Educator  AS  Instruction Review Code  1- Verbalizes Understanding      Stress and Anxiety: -  Provides group verbal and written instruction about the health risks of elevated stress and causes of high stress.  Discuss the correlation between heart/lung disease and anxiety and treatment options. Review healthy ways to manage with stress and anxiety.   Depression: - Provides group verbal and written instruction on the correlation between heart/lung disease and depressed mood, treatment options, and the stigmas associated with seeking treatment.   Anatomy & Physiology of the Heart: - Group verbal and written instruction and models provide basic cardiac anatomy and physiology, with the coronary electrical and arterial systems. Review of Valvular disease and Heart Failure   Cardiac Procedures: - Group verbal and written instruction to review commonly prescribed medications for heart disease. Reviews the medication, class of the drug, and side effects. Includes the steps to properly store meds and maintain the prescription regimen. (beta blockers and nitrates)   Cardiac Medications I: - Group verbal and written instruction to review commonly prescribed medications for heart disease. Reviews the medication, class of the drug, and side effects. Includes the steps to properly store meds and maintain the prescription regimen.   Cardiac Medications II: -Group verbal and written instruction to review commonly prescribed medications for heart disease. Reviews the medication, class of the drug, and side effects. (all other drug classes)   Cardiac Rehab from 12/31/2018 in [facility] Cardiac and Pulmonary Rehab  Date  12/03/18  Educator  [facility]  Instruction Review Code  1- Verbalizes Understanding       Go Sex-Intimacy & Heart Disease, Get SMART - Goal Setting: - Group verbal and written instruction through game format to discuss heart disease and the return to sexual intimacy. Provides group verbal and written material to discuss and apply goal setting through the application of the S.M.A.R.T.  Method.   Other Matters of the Heart: - Provides group verbal, written materials and models to describe Stable Angina and Peripheral Artery. Includes description of the disease process and treatment options available to the cardiac patient.   Exercise & Equipment Safety: - Individual verbal instruction and demonstration of equipment use and safety with use of the equipment.   Cardiac Rehab from 12/31/2018 in [facility] Cardiac and Pulmonary Rehab  Date  11/30/18  Educator  AS  Instruction Review Code  1- Verbalizes Understanding      Infection Prevention: - Provides verbal and written material to individual with discussion of infection control including proper hand washing and proper equipment cleaning during exercise session.   Cardiac Rehab from 12/31/2018 in [facility] Cardiac and Pulmonary Rehab  Date  11/30/18  Educator  AS  Instruction Review Code  1- Verbalizes Understanding      Falls Prevention: - Provides verbal and written material to individual with discussion of falls prevention and safety.   Cardiac Rehab from 12/31/2018 in [facility] Cardiac and Pulmonary Rehab  Date  11/30/18  Educator  AS  Instruction Review Code  1- Verbalizes Understanding      Diabetes: - Individual verbal and written instruction to review signs/symptoms of diabetes, desired ranges of glucose level fasting, after meals and with exercise. Acknowledge that pre and post exercise glucose checks will be done for 3 sessions at entry of program.   Cardiac Rehab from 12/31/2018 in [facility] Cardiac and Pulmonary Rehab  Date  11/13/18  Educator  [facility]  Instruction Review Code  1- Verbalizes Understanding      Know Your Numbers and Risk Factors: -Group verbal and written instruction about important numbers in your health.  Discussion of what are risk factors and how they play a role in the disease process.  Review of Cholesterol, Blood Pressure, Diabetes, and BMI and the role they play in your overall health.   Cardiac  Rehab from 12/31/2018 in [facility] Cardiac and Pulmonary Rehab  Date  12/03/18  Educator  [facility]  Instruction Review Code  1- Verbalizes Understanding      Sleep Hygiene: -Provides group verbal and written instruction about how sleep can affect your health.  Define sleep hygiene, discuss sleep cycles and impact of sleep habits. Review good sleep hygiene tips.    Other: -Provides group and verbal instruction on various topics (see comments)   Knowledge Questionnaire Score:   Core Components/Risk Factors/Patient Goals at Admission: Personal Goals and Risk Factors at Admission - 11/30/18 1452      Core Components/Risk Factors/Patient Goals on Admission    Weight Management  Yes;Obesity;Weight Loss    Intervention  Weight Management/Obesity: Establish reasonable short term and long term weight goals.    Admit Weight  335 lb 12.8 oz (152.3 kg)    Goal Weight: Short Term  325 lb (147.4 kg)    Goal Weight: Long Term  315 lb (142.9 kg)    Intervention  Provide education about signs/symptoms and action to take for hypo/hyperglycemia.;Provide education about proper nutrition, including hydration, and aerobic/resistive exercise prescription along with prescribed medications to achieve blood glucose in normal ranges: Fasting glucose 65-99 mg/dL    Expected Outcomes  Short Term: Participant verbalizes understanding of the signs/symptoms and immediate care of hyper/hypoglycemia, proper foot care and importance of medication, aerobic/resistive exercise and nutrition plan for blood glucose control.;Long Term: Attainment of HbA1C < 7%.    Heart Failure  Yes    Intervention  Provide a combined exercise and nutrition program that is supplemented with education, support and counseling about heart failure. Directed toward relieving symptoms such as shortness of breath, decreased exercise tolerance, and extremity edema.    Expected Outcomes  Improve functional capacity of life;Short term: Attendance in program 2-3  days a week with increased exercise capacity. Reported lower sodium intake. Reported increased fruit and vegetable intake. Reports medication compliance.;Short term: Daily weights obtained and reported for increase. Utilizing diuretic protocols set by physician.;Long term: Adoption of self-care skills and reduction of barriers for early signs and symptoms recognition and intervention leading to self-care maintenance.    Intervention  Provide education on lifestyle modifcations including regular physical activity/exercise, weight management, moderate sodium restriction and increased consumption of fresh fruit, vegetables, and low fat dairy, alcohol moderation, and smoking cessation.;Monitor prescription use compliance.    Expected Outcomes  Short Term: Continued assessment and intervention until BP is < 140/4m HG in hypertensive participants. < 130/84mHG in hypertensive participants with diabetes, heart failure or chronic kidney disease.;Long Term: Maintenance of blood pressure at goal levels.    Intervention  Provide education and support for participant on nutrition & aerobic/resistive exercise along with prescribed medications to achieve LDL <7046mHDL >48m75m  Expected Outcomes  Short Term: Participant states understanding of desired cholesterol values and is compliant with medications prescribed. Participant is following exercise prescription and nutrition guidelines.;Long Term: Cholesterol controlled with medications as prescribed, with individualized exercise RX and with personalized nutrition plan. Value goals: LDL < 70mg45mL > 40  mg.       Core Components/Risk Factors/Patient Goals Review:  Goals and Risk Factor Review    Row Name 12/28/18 1608             Core Components/Risk Factors/Patient Goals Review   Personal Goals Review  Weight Management/Obesity;Heart Failure;Diabetes;Lipids;Hypertension       Review  Weight was down to 328 lbs today. Rochell reports his weight has been  steadily decreasing and he is working out on the days he does not have rehab and [employer]. [place] checks his blood sugar at home and reports mostly stable numbers. He weighs every day to help manage heart failure. Patient reports taking all meds as perscribed to manage BP and Lipids.       Expected Outcomes  Short: Short term weight goal 325. Long: Independently mange diabetes, heart failure, lipids, and hypertension with meds, exercise, and diet. Long term weight goal 315 lbs.          Core Components/Risk Factors/Patient Goals at Discharge (Final Review):  Goals and Risk Factor Review - 12/28/18 1608      Core Components/Risk Factors/Patient Goals Review   Personal Goals Review  Weight Management/Obesity;Heart Failure;Diabetes;Lipids;Hypertension    Review  Weight was down to 328 lbs today. Aleck reports his weight has been steadily decreasing and he is working out on the days he does not have rehab and [employer]. Othman checks his blood sugar at home and reports mostly stable numbers. He weighs every day to help manage heart failure. Patient reports taking all meds as perscribed to manage BP and Lipids.    Expected Outcomes  Short: Short term weight goal 325. Long: Independently mange diabetes, heart failure, lipids, and hypertension with meds, exercise, and diet. Long term weight goal 315 lbs.       ITP Comments: ITP Comments    Row Name 11/13/18 1338 11/30/18 1441 12/02/18 1703 12/03/18 1429 12/10/18 1720   ITP Comments  Virtual initial orientation completed. EP/RD orientation scheduled for 10/8 at 11am  Initial 6MWT complete.  ITP created and sent to Dr Sabra Heck  First full day of exercise!  Patient was oriented to gym and equipment including functions, settings, policies, and procedures.  Patient's individual exercise prescription and treatment plan were reviewed.  All starting workloads were established based on the results of the 6 minute walk test done at initial orientation  visit.  The plan for exercise progression was also introduced and progression will be customized based on patient's performance and goals.  Initial RD eval complete  Ismaeel was asking about weight gain. He was educated on fluid retention and heart failure. He was encouraged to weigh every day and was given a heart failure daily symptom check sheet.   [place] Name 12/16/18 0643 01/13/19 1019         ITP Comments  30 day review completed. Continue with ITP sent to Dr. Emily Filbert, Medical Director of Cardiac and Pulmonary Rehab for review , changes as needed and signature.  30 day review competed . ITP sent to Dr Emily Filbert for review, changes as needed and ITP approval signature.         Comments:

## 2019-01-13 NOTE — Progress Notes (Signed)
Daily Session Note  Patient Details  Name: Joel [name] MRN: [id number] Date of Birth: [birth year] Referring Provider:     Cardiac Rehab from 11/30/2018 in [facility] Cardiac and Pulmonary Rehab  Referring Provider  Gollan      Encounter Date: 01/13/2019  Check In: Session Check In - 01/13/19 1647      Check-In   Supervising physician immediately available to respond to emergencies  See telemetry face sheet for immediately available ER MD    Location  [facility]-Cardiac & Pulmonary Rehab    Staff Present  Renita Papa, RN Vickki Hearing, BA, ACSM CEP, Exercise Physiologist;Melissa Caiola RDN, LDN    Virtual Visit  No    Medication changes reported      No    Fall or balance concerns reported     No    Warm-up and Cool-down  Performed on first and last piece of equipment    Resistance Training Performed  Yes    VAD Patient?  No    PAD/SET Patient?  No      Pain Assessment   Currently in Pain?  No/denies          Social History   Tobacco Use  Smoking Status Never Smoker  Smokeless Tobacco Never Used    Goals Met:  Independence with exercise equipment Exercise tolerated well No report of cardiac concerns or symptoms Strength training completed today  Goals Unmet:  Not Applicable  Comments: Pt able to follow exercise prescription today without complaint.  Will continue to monitor for progression.    Dr. Emily Filbert is Medical Director for [place] and [facility].

## 2019-01-14 ENCOUNTER — Encounter: Payer: Self-pay | Admitting: Internal Medicine

## 2019-01-14 ENCOUNTER — Ambulatory Visit: Payer: Medicaid Other | Admitting: Internal Medicine

## 2019-01-14 ENCOUNTER — Encounter: Payer: Medicaid Other | Admitting: *Deleted

## 2019-01-14 ENCOUNTER — Encounter: Payer: Self-pay | Admitting: Family

## 2019-01-14 VITALS — BP 120/62 | HR 105 | Temp 97.3°F | Resp 16 | Ht 67.0 in | Wt 320.0 lb

## 2019-01-14 DIAGNOSIS — N183 Chronic kidney disease, stage 3 unspecified: Secondary | ICD-10-CM | POA: Diagnosis not present

## 2019-01-14 DIAGNOSIS — G4733 Obstructive sleep apnea (adult) (pediatric): Secondary | ICD-10-CM

## 2019-01-14 DIAGNOSIS — I5022 Chronic systolic (congestive) heart failure: Secondary | ICD-10-CM | POA: Diagnosis not present

## 2019-01-14 NOTE — Progress Notes (Signed)
Daily Session Note  Patient Details  Name: Joel [name] MRN: [id number] Date of Birth: 12-22-1980 Referring Provider:     Cardiac Rehab from 11/30/2018 in [facility] Cardiac and Pulmonary Rehab  Referring Provider  Gollan      Encounter Date: 01/14/2019  Check In: Session Check In - 01/14/19 1703      Check-In   Supervising physician immediately available to respond to emergencies  See telemetry face sheet for immediately available ER MD    Location  [facility]-Cardiac & Pulmonary Rehab    Staff Present  Renita Papa, RN BSN;Jeanna Durrell BS, Exercise Physiologist;Kelly Amedeo Plenty, BS, ACSM CEP, Exercise Physiologist    Virtual Visit  No    Medication changes reported      No    Fall or balance concerns reported     No    Warm-up and Cool-down  Performed on first and last piece of equipment    Resistance Training Performed  Yes    VAD Patient?  No    PAD/SET Patient?  No      Pain Assessment   Currently in Pain?  No/denies          Social History   Tobacco Use  Smoking Status Never Smoker  Smokeless Tobacco Never Used    Goals Met:  Independence with exercise equipment Exercise tolerated well No report of cardiac concerns or symptoms Strength training completed today  Goals Unmet:  Not Applicable  Comments: Pt able to follow exercise prescription today without complaint.  Will continue to monitor for progression.    Dr. Emily Filbert is Medical Director for [place] and [facility].

## 2019-01-14 NOTE — Patient Instructions (Signed)

## 2019-01-14 NOTE — Progress Notes (Signed)
[facility] [address] [place], [place] [id number]  Pulmonary Sleep Medicine   Office Visit Note  Patient Name: Joel CounterBrian L Corkum DOB: 05/12/1980 MRN [id number]  Date of Service: 01/14/2019  Complaints/HPI: Patient has multiple medical problems including morbid obesity congestive cardiomyopathy with systolic chronic heart failure hypertensive heart disease chronic kidney disease stage III who comes in because he had a sleep study done sleep study showed that he had severe obstructive sleep apnea.  Patient also complains of having hypnagogic hallucinations.  Patient states that he feels left and sometimes when he wakes up choking gasping at nighttime.  His significant other present in the room states that he snores loudly he is tired all the time.  Patient denies having any cataplectic symptoms.  He does get headaches.  He does have high blood pressure.  And he has had strokes in the past.  As already mentioned above patient has significant cardiomyopathy and is seen in the cardiology clinic.  ROS  General: (-) fever, (-) chills, (-) night sweats, (-) weakness Skin: (-) rashes, (-) itching,. Eyes: (-) visual changes, (-) redness, (-) itching. Nose and Sinuses: (-) nasal stuffiness or itchiness, (-) postnasal drip, (-) nosebleeds, (-) sinus trouble. Mouth and Throat: (-) sore throat, (-) hoarseness. Neck: (-) swollen glands, (-) enlarged thyroid, (-) neck pain. Respiratory: - cough, (-) bloody sputum, + shortness of breath, - wheezing. Cardiovascular: - ankle swelling, (-) chest pain. Lymphatic: (-) lymph node enlargement. Neurologic: (-) numbness, (-) tingling. Psychiatric: (-) anxiety, (-) depression   Current Medication: Outpatient Encounter Medications as of 01/14/2019  Medication Sig  . amLODipine (NORVASC) 5 MG tablet Take 1 tablet (5 mg total) by mouth daily.  Marland Kitchen. aspirin 81 MG chewable tablet Chew 1 tablet (81 mg total) by mouth daily.  Marland Kitchen. atorvastatin (LIPITOR) 80 MG  tablet Take 1 tablet (80 mg total) by mouth daily at 6 PM.  . carvedilol (COREG) 25 MG tablet Take 1 tablet (25 mg total) by mouth 2 (two) times daily with a meal.  . empagliflozin (JARDIANCE) 25 MG TABS tablet Take by mouth.  . esomeprazole (NEXIUM) 20 MG capsule Take 20 mg by mouth daily at 12 noon.  Marland Kitchen. HUMALOG 100 UNIT/ML injection 10 units at breakfast 10 units at lunch 10 units at dinner  . insulin aspart (NOVOLOG) 100 UNIT/ML injection Inject 5 Units into the skin 3 (three) times daily with meals.  . insulin detemir (LEVEMIR) 100 UNIT/ML injection Inject 0.45 mLs (45 Units total) into the skin 2 (two) times daily.  Marland Kitchen. liraglutide (VICTOZA) 18 MG/3ML SOPN Inject into the skin.  Marland Kitchen. sacubitril-valsartan (ENTRESTO) 97-103 MG Take 1 tablet by mouth 2 (two) times daily.  Marland Kitchen. spironolactone (ALDACTONE) 25 MG tablet Take 25 mg by mouth daily.  . [DISCONTINUED] BYDUREON 2 MG PEN Inject 2 mg into the skin once a week. (Patient not taking: Reported on 12/28/2018)  . [DISCONTINUED] furosemide (LASIX) 20 MG tablet Take 20 mg by mouth as needed.   No facility-administered encounter medications on file as of 01/14/2019.     Surgical History: Past Surgical History:  Procedure Laterality Date  . NO PAST SURGERIES      Medical History: Past Medical History:  Diagnosis Date  . Anxiety   . Chronic diastolic CHF (congestive heart failure) (HCC)   . Diabetes mellitus without complication (HCC)   . Hyperlipidemia   . Hypertension   . Hypertensive heart disease   . Noncompliance with medications   . Obesity   .  Stroke (HCC)   . Systolic dysfunction    a. TTE 06/2014: EF 45-50%, normal wall motion, mild MR, mild AI    Family History: Family History  Problem Relation Age of Onset  . Hypertension Mother   . Hyperlipidemia Mother   . Diabetes Mother   . CAD Father        a. stenting in his early 27s  . Heart disease Father   . Stroke Father   . Diabetes Maternal Grandmother   . Diabetes  Maternal Grandfather   . CAD Paternal Grandmother   . CAD Paternal Grandfather   . Colon cancer Paternal Grandfather   . Diabetes Paternal Uncle     Social History: Social History   Socioeconomic History  . Marital status: Married    Spouse name: Not on file  . Number of children: 1  . Years of education: 49  . Highest education level: 12th grade  Occupational History  . Not on file  Social Needs  . Financial resource strain: Very hard  . Food insecurity    Worry: Often true    Inability: Often true  . Transportation needs    Medical: No    Non-medical: No  Tobacco Use  . Smoking status: Never Smoker  . Smokeless tobacco: Never Used  Substance and Sexual Activity  . Alcohol use: Not Currently    Frequency: Never    Comment: Very infrequently; ~2-3 drinks/year   . Drug use: No  . Sexual activity: Yes    Birth control/protection: None  Lifestyle  . Physical activity    Days per week: 2 days    Minutes per session: 30 min  . Stress: Very much  Relationships  . Social Musician on phone: Once a week    Gets together: Once a week    Attends religious service: Never    Active member of club or organization: No    Attends meetings of clubs or organizations: Never    Relationship status: Married  . Intimate partner violence    Fear of current or ex partner: No    Emotionally abused: No    Physically abused: No    Forced sexual activity: No  Other Topics Concern  . Not on file  Social History Narrative  . Not on file    Vital Signs: Blood pressure 120/62, pulse (!) 105, temperature (!) 97.3 F (36.3 C), resp. rate 16, height 5\' 7"  (1.702 m), weight (!) 320 lb (145.2 kg), SpO2 99 %.  Examination: General Appearance: The patient is well-developed, well-nourished, and in no distress. Skin: Gross inspection of skin unremarkable. Head: normocephalic, no gross deformities. Eyes: no gross deformities noted. ENT: ears appear grossly normal no  exudates. Neck: Supple. No thyromegaly. No LAD. Respiratory: No rhonchi no rales are noted at this time. Cardiovascular: Normal S1 and S2 without murmur or rub. Extremities: No cyanosis. pulses are equal. Neurologic: Alert and oriented. No involuntary movements.  LABS: Recent Results (from the past 2160 hour(s))  Glucose, capillary     Status: Abnormal   Collection Time: 12/02/18  4:33 PM  Result Value Ref Range   Glucose-Capillary 127 (H) 70 - 99 mg/dL  Glucose, capillary     Status: None   Collection Time: 12/02/18  5:42 PM  Result Value Ref Range   Glucose-Capillary 84 70 - 99 mg/dL  Glucose, capillary     Status: Abnormal   Collection Time: 12/03/18  5:03 PM  Result Value Ref Range  Glucose-Capillary 124 (H) 70 - 99 mg/dL  Glucose, capillary     Status: Abnormal   Collection Time: 12/03/18  5:57 PM  Result Value Ref Range   Glucose-Capillary 119 (H) 70 - 99 mg/dL  Glucose, capillary     Status: Abnormal   Collection Time: 12/07/18  4:42 PM  Result Value Ref Range   Glucose-Capillary 115 (H) 70 - 99 mg/dL  Glucose, capillary     Status: None   Collection Time: 12/07/18  5:40 PM  Result Value Ref Range   Glucose-Capillary 87 70 - 99 mg/dL    Radiology: No results found.  No results found.  No results found.    Assessment and Plan: Patient Active Problem List   Diagnosis Date Noted  . Type 1 diabetes mellitus with stage 3 chronic kidney disease ([place]) 05/21/2018  . Depression, major, recurrent, moderate ([place]) 12/19/2017  . Fatigue 04/07/2017  . Dilated cardiomyopathy ([place]) 02/23/2017  . Hypertensive heart disease with heart failure ([place]) 02/23/2017  . Essential hypertension 02/21/2017  . Uncontrolled type 2 diabetes mellitus with nephropathy ([place]) 02/21/2017  . H/O medication noncompliance 02/12/2017  . Chronic kidney disease (CKD), stage III (moderate) 02/12/2017  . Systolic CHF, chronic ([place]) 10/11/2014    1. OSA I personally reviewed the sleep study  which shows that he had desaturation down to 70% and also had severe obstructive sleep apnea with an AHI greater than 48/h.  Patient in this setting will be set up urgently with a CPAP auto titrating device however because of his underlying cardiac issues he needs to have a proper titration study done in the lab.  I suspect that patient may actually end up having to use BiPAP if he is not able to tolerate the CPAP. 2. CHF followed by the cardiology clinic and this was the main reason why he was actually sent to [place] for a sleep study this will be followed up after the study is done 3. CKD III continue to monitor his labs 4. Morbid Obesity I did discuss with a diet and exercise and losing weight.  The patient is going to work on this he says that he already does go to the gym. 5. Shortness of breath likely related to underlying morbid obesity  General Counseling: I have discussed the findings of the evaluation and examination with Aaron Edelman.  I have also discussed any further diagnostic evaluation thatmay be needed or ordered today. Garrett verbalizes understanding of the findings of todays visit. We also reviewed his medications today and discussed drug interactions and side effects including but not limited excessive drowsiness and altered mental states. We also discussed that there is always a risk not just to him but also people around him. he has been encouraged to call the office with any questions or concerns that should arise related to todays visit.  Orders Placed This Encounter  Procedures  . For home use only DME continuous positive airway pressure (CPAP)    Order Specific Question:   Length of Need    Answer:   Lifetime    Order Specific Question:   Patient has OSA or probable OSA    Answer:   Yes    Order Specific Question:   Settings    Answer:   Autotitration    Order Specific Question:   CPAP supplies needed    Answer:   Mask, headgear, cushions, filters, heated tubing and water chamber  .  Cpap titration    Standing Status:   Future  Standing Expiration Date:   01/14/2020    Order Specific Question:   Where should this test be performed:    Answer:   [facility]     Time spent: 45 minutes greater than 50% of time spent counseling  I have personally obtained a history, examined the patient, evaluated laboratory and imaging results, formulated the assessment and plan and placed orders.    Yevonne Pax, MD [facility] Pulmonary and Critical Care Sleep medicine

## 2019-01-18 ENCOUNTER — Other Ambulatory Visit: Payer: Self-pay

## 2019-01-18 ENCOUNTER — Encounter: Payer: Medicaid Other | Admitting: *Deleted

## 2019-01-18 DIAGNOSIS — I5022 Chronic systolic (congestive) heart failure: Secondary | ICD-10-CM

## 2019-01-18 LAB — GLUCOSE, CAPILLARY
Glucose-Capillary: 43 mg/dL — CL (ref 70–99)
Glucose-Capillary: 56 mg/dL — ABNORMAL LOW (ref 70–99)
Glucose-Capillary: 60 mg/dL — ABNORMAL LOW (ref 70–99)
Glucose-Capillary: 66 mg/dL — ABNORMAL LOW (ref 70–99)

## 2019-01-18 NOTE — Progress Notes (Signed)
Daily Session Note  Patient Details  Name: Joel [name] MRN: [id number] Date of Birth: 1981/01/15 Referring Provider:     Cardiac Rehab from 11/30/2018 in [facility] Cardiac and Pulmonary Rehab  Referring Provider  Gollan      Encounter Date: 01/18/2019  Check In: Session Check In - 01/18/19 1624      Check-In   Supervising physician immediately available to respond to emergencies  See telemetry face sheet for immediately available ER MD    Location  [facility]-Cardiac & Pulmonary Rehab    Staff Present  Renita Papa, RN Moises Blood, BS, ACSM CEP, Exercise Physiologist;Joseph Tessie Fass RCP,RRT,BSRT    Virtual Visit  No    Medication changes reported      No    Fall or balance concerns reported     No    Warm-up and Cool-down  Performed on first and last piece of equipment    Resistance Training Performed  Yes    VAD Patient?  No    PAD/SET Patient?  No      Pain Assessment   Currently in Pain?  No/denies          Social History   Tobacco Use  Smoking Status Never Smoker  Smokeless Tobacco Never Used    Goals Met:  Independence with exercise equipment Exercise tolerated well No report of cardiac concerns or symptoms Strength training completed today  Goals Unmet:  Not Applicable  Comments: Pt able to follow exercise prescription today without complaint.  Will continue to monitor for progression.      Dr. Emily Filbert is Medical Director for [place] and [facility].

## 2019-01-19 ENCOUNTER — Telehealth: Payer: Self-pay

## 2019-01-19 NOTE — Telephone Encounter (Signed)
Confirmed pt sleep study for 01/20/19 beth °

## 2019-01-20 ENCOUNTER — Encounter: Payer: Medicaid Other | Admitting: *Deleted

## 2019-01-20 ENCOUNTER — Ambulatory Visit: Payer: Medicaid Other | Admitting: Internal Medicine

## 2019-01-20 ENCOUNTER — Other Ambulatory Visit: Payer: Self-pay

## 2019-01-20 DIAGNOSIS — G4733 Obstructive sleep apnea (adult) (pediatric): Secondary | ICD-10-CM

## 2019-01-20 DIAGNOSIS — I5022 Chronic systolic (congestive) heart failure: Secondary | ICD-10-CM | POA: Diagnosis not present

## 2019-01-20 NOTE — Progress Notes (Signed)
Daily Session Note  Patient Details  Name: Joel [name] MRN: [id number] Date of Birth: [birth year] Referring Provider:     Cardiac Rehab from 11/30/2018 in [facility] Cardiac and Pulmonary Rehab  Referring Provider  Gollan      Encounter Date: 01/20/2019  Check In: Session Check In - 01/20/19 1619      Check-In   Supervising physician immediately available to respond to emergencies  See telemetry face sheet for immediately available ER MD    Location  [facility]-Cardiac & Pulmonary Rehab    Staff Present  Renita Papa, RN Vickki Hearing, BA, ACSM CEP, Exercise Physiologist;Melissa Caiola RDN, LDN    Virtual Visit  No    Medication changes reported      No    Fall or balance concerns reported     No    Warm-up and Cool-down  Performed on first and last piece of equipment    Resistance Training Performed  Yes    VAD Patient?  No    PAD/SET Patient?  No      Pain Assessment   Currently in Pain?  No/denies          Social History   Tobacco Use  Smoking Status Never Smoker  Smokeless Tobacco Never Used    Goals Met:  Independence with exercise equipment Exercise tolerated well No report of cardiac concerns or symptoms Strength training completed today  Goals Unmet:  Not Applicable  Comments: Pt able to follow exercise prescription today without complaint.  Will continue to monitor for progression.    Dr. Emily Filbert is Medical Director for [place] and [facility].

## 2019-01-21 ENCOUNTER — Telehealth: Payer: Self-pay

## 2019-01-21 ENCOUNTER — Encounter: Payer: Medicaid Other | Admitting: *Deleted

## 2019-01-21 DIAGNOSIS — I5022 Chronic systolic (congestive) heart failure: Secondary | ICD-10-CM

## 2019-01-21 NOTE — Telephone Encounter (Signed)
Called confirmed appointment with patient. klh 

## 2019-01-21 NOTE — Progress Notes (Deleted)
3: Snoring- - patient continues to endorse snoring and says that his wife wakes him up often due to him not breathing - will place order for a sleep study to rule out sleep apnea

## 2019-01-21 NOTE — Progress Notes (Signed)
Daily Session Note  Patient Details  Name: Joel [name] MRN: [id number] Date of Birth: 1980-04-08 Referring Provider:     Cardiac Rehab from 11/30/2018 in [facility] Cardiac and Pulmonary Rehab  Referring Provider  Gollan      Encounter Date: 01/21/2019  Check In: Session Check In - 01/21/19 1616      Check-In   Supervising physician immediately available to respond to emergencies  See telemetry face sheet for immediately available ER MD    Location  [facility]-Cardiac & Pulmonary Rehab    Staff Present  Renita Papa, RN Moises Blood, BS, ACSM CEP, Exercise Physiologist;Jessica [place], MA, RCEP, CCRP, CCET;Joseph Hood RCP,RRT,BSRT    Virtual Visit  No    Medication changes reported      No    Fall or balance concerns reported     No    Warm-up and Cool-down  Performed on first and last piece of equipment    Resistance Training Performed  Yes    VAD Patient?  No    PAD/SET Patient?  No      Pain Assessment   Currently in Pain?  No/denies          Social History   Tobacco Use  Smoking Status Never Smoker  Smokeless Tobacco Never Used    Goals Met:  Independence with exercise equipment Exercise tolerated well No report of cardiac concerns or symptoms Strength training completed today  Goals Unmet:  Not Applicable  Comments: Pt able to follow exercise prescription today without complaint.  Will continue to monitor for progression.    Dr. Emily Filbert is Medical Director for [place] and [facility].

## 2019-01-24 NOTE — Procedures (Signed)
[facility] [address] [place], [place] [address]  Patient Name: Joel [name] DOB: 10-31-1980   SLEEP STUDY INTERPRETATION  DATE OF SERVICE: January 20, 2019   SLEEP STUDY HISTORY: This patient is referred to the sleep lab for a baseline Polysomnography. Pertinent history includes a history of diagnosis of excessive daytime somnolence and snoring.  PROCEDURE: This overnight polysomnogram was performed using the Alice 5 acquisition system using the standard diagnostic protocol as outlined by the AASM. This includes 6 channels of EEG, 2 channelscannels of EOG, chin EMG, bilateral anterior tibialis EMG, nasal/oral thermister, PTAF, chest and abdominal wall movements, ECG and pulse oximetry. Apneas and Hypopneas were scored per AASM definition.  SLEEP ARCHITECHTURE: This is a baseline polysomnograph  study. The total recording time was 423.6 minutes and the patients total sleep time is noted to be 376.5 minutes. Sleep onset latency was 17.0 minutes and is within normal limits.  Stage R sleep onset latency was 96.0 minutes. Sleep maintenance efficiency was 89.0% and is within normal limits.  Sleep staging expressed as a percentage of total sleep time demonstrated 8.9% N1, 44.9% N2 and 14.3% N3  sleep. Stage R represents 31.9% of total sleep time. This is increased.  There were a total of 14 arousals  for an overall arousal index of 2.2 per hour of sleep. PLMS arousal were not noted. Arousals without respiratory events are  noted. This can contribute to sleep architechture disruption.  RESPIRATORY MONITORING:   Patient exhibits some evidence of sleep disorderd breathing characterized by 2 central apneas, 1 obstructive apneas and 0 mixed apneas. There were 10 obstructive hypopneas and 0 RERAs. Most of the apneas/hypopneas were of obstructive variety. The total apnea hypopnea index (apneas and hypopneas per hour of sleep) is 2.1 respiratory events per hour and is within normal  limits.  Respiratory monitoring demonstrated mild snoring through the night. There are a total of 248 snoring episodes representing 13.1% of sleep.   Baseline oxygen saturation during wakefulness was 95% and during NREM sleep averaged 93% through the night. Arterial saturation during REM sleep was 94% through the night. There was significant  oxygen desaturation with the respiratory events. Arterial oxygen desaturation occurred of at least 4% was noted with a low saturation of 88%. The study was performed off oxygen.  CARDIAC MONITORING:   Average heart rate is 63 during sleep with a high of 94 beats per minute. Malignant arrhythmias were not noted.   CPAP titration: Patient was titrated on CPAP according to the sleep lab protocol.  Patient was started on a pressure of 5 CWP and increased to a maximal pressure of 10 CWP.  Patient achieved control of the apneas in the hypopneas and respiratory events however still have desaturations noted up until a pressure of 10 CWP.  On a pressure of 10 CWP patient slept for 112 minutes and exhibited stage R for 63.5 minutes.  The total apnea-hypopnea index was 1.6 and the lowest oxygen saturation was 90%.   IMPRESSIONS:  --This overnight polysomnogram demonstrates insignificant evidence sleep apnea with an overall AHI 2.2 per hour on the CPAP. --The overall AHI was no worse  during Stage R. --There were associated some arterial oxygen desaturations noted with a low saturation of 88% --There was no significant PLMS noted in this study. --There is mild snoring noted throughout the study.    RECOMMENDATIONS:  --CPAP titration study is adequate to control this patient's obstructive sleep apnea.  It appears the optimal pressure is CPAP  of 10 CWP.  On this pressure there was no significant oxygen desaturation and the respiratory events were adequately controlled.. --Nasal decongestants and antihistamines may be of help for increased upper airways resistance  when present. --Weight loss through dietary and lifestyle modification is recommended in the presence of obesity. --A search for and treatment of any underlying cardiopulmonary disease is      recommended in the presence of oxygen desaturations. --Alternative treatment options if the patient is not willing to use CPAP include oral   appliances as well as surgical intervention which may help in the appropriate patient. --Clinical correlation is recommended. Please feel free to call the office for any further  questions or assistance in the care of this patient.     Yevonne Pax, MD [facility] Pulmonary Critical Care Medicine Sleep medicine

## 2019-01-25 ENCOUNTER — Encounter: Payer: Self-pay | Admitting: Internal Medicine

## 2019-01-25 ENCOUNTER — Other Ambulatory Visit: Payer: Self-pay

## 2019-01-25 ENCOUNTER — Encounter: Payer: Medicaid Other | Admitting: *Deleted

## 2019-01-25 ENCOUNTER — Ambulatory Visit: Payer: Medicaid Other | Admitting: Internal Medicine

## 2019-01-25 VITALS — BP 133/86 | HR 93 | Temp 97.3°F | Resp 16 | Ht 67.0 in | Wt 312.0 lb

## 2019-01-25 DIAGNOSIS — I5022 Chronic systolic (congestive) heart failure: Secondary | ICD-10-CM | POA: Diagnosis not present

## 2019-01-25 DIAGNOSIS — G4733 Obstructive sleep apnea (adult) (pediatric): Secondary | ICD-10-CM

## 2019-01-25 DIAGNOSIS — N183 Chronic kidney disease, stage 3 unspecified: Secondary | ICD-10-CM

## 2019-01-25 NOTE — Progress Notes (Signed)
[facility] [place], [place] [address]  Pulmonary Sleep Medicine   Office Visit Note  Patient Name: Joel [name] DOB: [birth year] MRN [id number]  Date of Service: 01/25/2019  Complaints/HPI: Patient here for follow-up after CPAP titration study was done.  The CPAP titration showed that he needs to be on a pressure of 10 CWP.  The patient states that he did better this time around and he felt like there was less air that he was breathing against.  He is a candidate for respiratory pressure release so that he will be able to tolerate the CPAP.  In addition he has a history of systolic heart failure last ejection fraction was 20 to 25%.  With his findings of obstructive sleep apnea I do have concern about pulmonary hypertension.  I spoke with him regarding this and we are going to get an echocardiogram specifically to assess for pulmonary arterial hypertension.  He did still have some oxygen desaturations and this will need to be assessed once he is on the CPAP therapy.  Also did discuss exercise and weight loss he has been working on that and has managed to lose some weight since the last visit.  ROS  General: (-) fever, (-) chills, (-) night sweats, (-) weakness Skin: (-) rashes, (-) itching,. Eyes: (-) visual changes, (-) redness, (-) itching. Nose and Sinuses: (-) nasal stuffiness or itchiness, (-) postnasal drip, (-) nosebleeds, (-) sinus trouble. Mouth and Throat: (-) sore throat, (-) hoarseness. Neck: (-) swollen glands, (-) enlarged thyroid, (-) neck pain. Respiratory: - cough, (-) bloody sputum, + shortness of breath, - wheezing. Cardiovascular: - ankle swelling, (-) chest pain. Lymphatic: (-) lymph node enlargement. Neurologic: (-) numbness, (-) tingling. Psychiatric: (-) anxiety, (-) depression   Current Medication: Outpatient Encounter Medications as of 01/25/2019  Medication Sig  . amLODipine (NORVASC) 5 MG tablet Take 1 tablet (5 mg total) by  mouth daily.  Marland Kitchen aspirin 81 MG chewable tablet Chew 1 tablet (81 mg total) by mouth daily.  Marland Kitchen atorvastatin (LIPITOR) 80 MG tablet Take 1 tablet (80 mg total) by mouth daily at 6 PM.  . carvedilol (COREG) 25 MG tablet Take 1 tablet (25 mg total) by mouth 2 (two) times daily with a meal.  . empagliflozin (JARDIANCE) 25 MG TABS tablet Take by mouth.  . esomeprazole (NEXIUM) 20 MG capsule Take 20 mg by mouth daily at 12 noon.  Marland Kitchen HUMALOG 100 UNIT/ML injection 10 units at breakfast 10 units at lunch 10 units at dinner  . insulin aspart (NOVOLOG) 100 UNIT/ML injection Inject 5 Units into the skin 3 (three) times daily with meals.  . insulin detemir (LEVEMIR) 100 UNIT/ML injection Inject 0.45 mLs (45 Units total) into the skin 2 (two) times daily.  Marland Kitchen liraglutide (VICTOZA) 18 MG/3ML SOPN Inject into the skin.  Marland Kitchen sacubitril-valsartan (ENTRESTO) 97-103 MG Take 1 tablet by mouth 2 (two) times daily.  Marland Kitchen spironolactone (ALDACTONE) 25 MG tablet Take 25 mg by mouth daily.   No facility-administered encounter medications on file as of 01/25/2019.    Surgical History: Past Surgical History:  Procedure Laterality Date  . NO PAST SURGERIES      Medical History: Past Medical History:  Diagnosis Date  . Anxiety   . Chronic diastolic CHF (congestive heart failure) ([place])   . Diabetes mellitus without complication ([place])   . Hyperlipidemia   . Hypertension   . Hypertensive heart disease   . Noncompliance with medications   . Obesity   .  Stroke (HCC)   . Systolic dysfunction    a. TTE 06/2014: EF 45-50%, normal wall motion, mild MR, mild AI    Family History: Family History  Problem Relation Age of Onset  . Hypertension Mother   . Hyperlipidemia Mother   . Diabetes Mother   . CAD Father        a. stenting in his early 73s  . Heart disease Father   . Stroke Father   . Diabetes Maternal Grandmother   . Diabetes Maternal Grandfather   . CAD Paternal Grandmother   . CAD Paternal Grandfather   .  Colon cancer Paternal Grandfather   . Diabetes Paternal Uncle     Social History: Social History   Socioeconomic History  . Marital status: Married    Spouse name: Not on file  . Number of children: 1  . Years of education: 78  . Highest education level: 12th grade  Occupational History  . Not on file  Tobacco Use  . Smoking status: Never Smoker  . Smokeless tobacco: Never Used  Substance and Sexual Activity  . Alcohol use: Not Currently    Comment: Very infrequently; ~2-3 drinks/year   . Drug use: No  . Sexual activity: Yes    Birth control/protection: None  Other Topics Concern  . Not on file  Social History Narrative  . Not on file   Social Determinants of Health   Financial Resource Strain:   . Difficulty of Paying Living Expenses: Not on file  Food Insecurity:   . Worried About Programme researcher, broadcasting/film/video in the Last Year: Not on file  . Ran Out of Food in the Last Year: Not on file  Transportation Needs:   . Lack of Transportation (Medical): Not on file  . Lack of Transportation (Non-Medical): Not on file  Physical Activity:   . Days of Exercise per Week: Not on file  . Minutes of Exercise per Session: Not on file  Stress:   . Feeling of Stress : Not on file  Social Connections:   . Frequency of Communication with Friends and Family: Not on file  . Frequency of Social Gatherings with Friends and Family: Not on file  . Attends Religious Services: Not on file  . Active Member of Clubs or Organizations: Not on file  . Attends Banker Meetings: Not on file  . Marital Status: Not on file  Intimate Partner Violence:   . Fear of Current or Ex-Partner: Not on file  . Emotionally Abused: Not on file  . Physically Abused: Not on file  . Sexually Abused: Not on file    Vital Signs: Blood pressure 133/86, pulse 93, temperature (!) 97.3 F (36.3 C), resp. rate 16, height 5\' 7"  (1.702 m), weight (!) 312 lb (141.5 kg), SpO2 98 %.  Examination: General  Appearance: The patient is well-developed, well-nourished, and in no distress. Skin: Gross inspection of skin unremarkable. Head: normocephalic, no gross deformities. Eyes: no gross deformities noted. ENT: ears appear grossly normal no exudates. Neck: Supple. No thyromegaly. No LAD. Respiratory: No rhonchi clear breath sounds. Cardiovascular: Normal S1 and S2 without murmur or rub. Extremities: No cyanosis. pulses are equal. Neurologic: Alert and oriented. No involuntary movements.  LABS: Recent Results (from the past 2160 hour(s))  Glucose, capillary     Status: Abnormal   Collection Time: 12/02/18  4:33 PM  Result Value Ref Range   Glucose-Capillary 127 (H) 70 - 99 mg/dL  Glucose, capillary  Status: None   Collection Time: 12/02/18  5:42 PM  Result Value Ref Range   Glucose-Capillary 84 70 - 99 mg/dL  Glucose, capillary     Status: Abnormal   Collection Time: 12/03/18  5:03 PM  Result Value Ref Range   Glucose-Capillary 124 (H) 70 - 99 mg/dL  Glucose, capillary     Status: Abnormal   Collection Time: 12/03/18  5:57 PM  Result Value Ref Range   Glucose-Capillary 119 (H) 70 - 99 mg/dL  Glucose, capillary     Status: Abnormal   Collection Time: 12/07/18  4:42 PM  Result Value Ref Range   Glucose-Capillary 115 (H) 70 - 99 mg/dL  Glucose, capillary     Status: None   Collection Time: 12/07/18  5:40 PM  Result Value Ref Range   Glucose-Capillary 87 70 - 99 mg/dL  Glucose, capillary     Status: Abnormal   Collection Time: 01/18/19  4:57 PM  Result Value Ref Range   Glucose-Capillary 43 (LL) 70 - 99 mg/dL   Comment 1 Notify RN   Glucose, capillary     Status: Abnormal   Collection Time: 01/18/19  5:14 PM  Result Value Ref Range   Glucose-Capillary 56 (L) 70 - 99 mg/dL  Glucose, capillary     Status: Abnormal   Collection Time: 01/18/19  5:31 PM  Result Value Ref Range   Glucose-Capillary 60 (L) 70 - 99 mg/dL  Glucose, capillary     Status: Abnormal   Collection Time:  01/18/19  5:47 PM  Result Value Ref Range   Glucose-Capillary 66 (L) 70 - 99 mg/dL    Radiology: No results found.  No results found.  No results found.    Assessment and Plan: Patient Active Problem List   Diagnosis Date Noted  . 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 Depression, major, recurrent, moderate (HCC) 12/19/2017  . Fatigue 04/07/2017  . Dilated cardiomyopathy (HCC) 02/23/2017  . Hypertensive heart disease with heart failure (HCC) 02/23/2017  . Essential hypertension 02/21/2017  . 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 H/O medication noncompliance 02/12/2017  . Chronic kidney disease (CKD), stage III (moderate) 02/12/2017  . Systolic CHF, chronic (HCC) 10/11/2014    1. Obstructive sleep apnea hypopnea syndrome patient has severe OSA based on his baseline sleep study.  He did benefit from CPAP and he will be placed on a pressure of 10 CWP.  Patient also has congestive heart failure and he is going to continue to follow-up with his clinic for this.  Hopefully with treatment of his OSA and will see some improvement.  In addition patient did better with expiratory pressure release so we will get a machine that is capable of doing this on a pressure of 10 CWP. 2. Systolic heart failure patient has an ejection fraction of 20 to 25% I suspect he may also have some underlying pulmonary arterial hypertension we will go ahead and get a follow-up echocardiogram to assess and will need to be monitored once he is adequately treated with a CPAP device. 3. Morbid obesity diet and exercise was discussed at length with the patient he is going to work on weight loss. 4. Chronic kidney disease per primary care team monitoring his renal function.  General Counseling: I have discussed the findings of the evaluation and examination with Arlys John.  I have also discussed any further diagnostic evaluation thatmay be needed  or ordered today. Arlys John  verbalizes understanding of the findings of todays visit. We also reviewed his medications today and discussed drug interactions and side effects including but not limited excessive drowsiness and altered mental states. We also discussed that there is always a risk not just to him but also people around him. he has been encouraged to call the office with any questions or concerns that should arise related to todays visit.  Orders Placed This Encounter  Procedures  . For home use only DME continuous positive airway pressure (CPAP)    Patient needs to  Be setup on CPAP 10CWP with expiratory pressure release    Order Specific Question:   Length of Need    Answer:   Lifetime    Order Specific Question:   Patient has OSA or probable OSA    Answer:   Yes    Order Specific Question:   Is the patient currently using CPAP in the home    Answer:   No    Order Specific Question:   Settings    Answer:   Other see comments    Order Specific Question:   CPAP supplies needed    Answer:   Mask, headgear, cushions, filters, heated tubing and water chamber  . ECHOCARDIOGRAM COMPLETE    Standing Status:   Future    Standing Expiration Date:   04/24/2020    Order Specific Question:   Where should this test be performed    Answer:   External    Order Specific Question:   Perflutren DEFINITY (image enhancing agent) should be administered unless hypersensitivity or allergy exist    Answer:   Administer Perflutren    Order Specific Question:   Is a special reader required? (athlete or structural heart)    Answer:   No    Order Specific Question:   Reason for exam-Echo    Answer:   CHF-Acute Systolic  428.21 / I50.21     Time spent: 25min  I have personally obtained a history, examined the patient, evaluated laboratory and imaging results, formulated the assessment and plan and placed orders.    Yevonne PaxSaadat A Fox Salminen, MD [facility] Pulmonary and Critical Care Sleep medicine

## 2019-01-25 NOTE — Patient Instructions (Signed)

## 2019-01-25 NOTE — Progress Notes (Signed)
Daily Session Note  Patient Details  Name: Joel [name] MRN: [id number] Date of Birth: Sep 09, 1980 Referring Provider:     Cardiac Rehab from 11/30/2018 in [facility] Cardiac and Pulmonary Rehab  Referring Provider  Gollan      Encounter Date: 01/25/2019  Check In: Session Check In - 01/25/19 1643      Check-In   Supervising physician immediately available to respond to emergencies  See telemetry face sheet for immediately available ER MD    Location  [facility]-Cardiac & Pulmonary Rehab    Staff Present  Renita Papa, RN Moises Blood, BS, ACSM CEP, Exercise Physiologist;Joseph Tessie Fass RCP,RRT,BSRT    Virtual Visit  No    Medication changes reported      No    Fall or balance concerns reported     No    Warm-up and Cool-down  Performed on first and last piece of equipment    Resistance Training Performed  Yes    VAD Patient?  No    PAD/SET Patient?  No      Pain Assessment   Currently in Pain?  No/denies          Social History   Tobacco Use  Smoking Status Never Smoker  Smokeless Tobacco Never Used    Goals Met:  Independence with exercise equipment Exercise tolerated well No report of cardiac concerns or symptoms Strength training completed today  Goals Unmet:  Not Applicable  Comments: Pt able to follow exercise prescription today without complaint.  Will continue to monitor for progression.    Dr. Emily Filbert is Medical Director for [place] and [facility].

## 2019-01-25 NOTE — Progress Notes (Signed)
Entered in error

## 2019-01-27 ENCOUNTER — Telehealth: Payer: Self-pay

## 2019-01-27 ENCOUNTER — Other Ambulatory Visit: Payer: Self-pay

## 2019-01-27 ENCOUNTER — Encounter: Payer: Medicaid Other | Admitting: *Deleted

## 2019-01-27 ENCOUNTER — Ambulatory Visit (INDEPENDENT_AMBULATORY_CARE_PROVIDER_SITE_OTHER): Payer: Medicaid Other

## 2019-01-27 DIAGNOSIS — I5022 Chronic systolic (congestive) heart failure: Secondary | ICD-10-CM

## 2019-01-27 DIAGNOSIS — G4733 Obstructive sleep apnea (adult) (pediatric): Secondary | ICD-10-CM

## 2019-01-27 NOTE — Telephone Encounter (Signed)
Confirmed pt Ultrasound for 01/29/19

## 2019-01-27 NOTE — Progress Notes (Signed)
Pt seen today for pap. Pt s/u with S10 Autoset at a pressure of 5-20. Pt fitted with med simplus FFM. Pt oriented to machine.

## 2019-01-27 NOTE — Telephone Encounter (Signed)
Confirmed appointment with patient. klh °

## 2019-01-27 NOTE — Progress Notes (Signed)
Daily Session Note  Patient Details  Name: Joel [name] MRN: [id number] Date of Birth: 1980-02-19 Referring Provider:     Cardiac Rehab from 11/30/2018 in [facility] Cardiac and Pulmonary Rehab  Referring Provider  Gollan      Encounter Date: 01/27/2019  Check In: Session Check In - 01/27/19 1628      Check-In   Supervising physician immediately available to respond to emergencies  See telemetry face sheet for immediately available ER MD    Location  [facility]-Cardiac & Pulmonary Rehab    Staff Present  Renita Papa, RN Vickki Hearing, BA, ACSM CEP, Exercise Physiologist;Melissa Caiola RDN, LDN    Virtual Visit  No    Medication changes reported      No    Fall or balance concerns reported     No    Warm-up and Cool-down  Performed on first and last piece of equipment    Resistance Training Performed  Yes    VAD Patient?  No    PAD/SET Patient?  No      Pain Assessment   Currently in Pain?  No/denies          Social History   Tobacco Use  Smoking Status Never Smoker  Smokeless Tobacco Never Used    Goals Met:  Independence with exercise equipment Exercise tolerated well No report of cardiac concerns or symptoms Strength training completed today  Goals Unmet:  Not Applicable  Comments: Pt able to follow exercise prescription today without complaint.  Will continue to monitor for progression.    Dr. Emily Filbert is Medical Director for [place] and [facility].

## 2019-01-28 ENCOUNTER — Telehealth: Payer: Self-pay

## 2019-01-28 ENCOUNTER — Encounter: Payer: Medicaid Other | Admitting: *Deleted

## 2019-01-28 DIAGNOSIS — I5022 Chronic systolic (congestive) heart failure: Secondary | ICD-10-CM | POA: Diagnosis not present

## 2019-01-28 NOTE — Progress Notes (Signed)
Daily Session Note  Patient Details  Name: Joel [name] MRN: [id number] Date of Birth: [birth year] Referring Provider:     Cardiac Rehab from 11/30/2018 in [facility] Cardiac and Pulmonary Rehab  Referring Provider  Gollan      Encounter Date: 01/28/2019  Check In: Session Check In - 01/28/19 1619      Check-In   Supervising physician immediately available to respond to emergencies  See telemetry face sheet for immediately available ER MD    Location  [facility]-Cardiac & Pulmonary Rehab    Staff Present  Renita Papa, RN Moises Blood, BS, ACSM CEP, Exercise Physiologist;Joseph Tessie Fass RCP,RRT,BSRT    Virtual Visit  No    Medication changes reported      No    Fall or balance concerns reported     No    Warm-up and Cool-down  Performed on first and last piece of equipment    Resistance Training Performed  Yes    VAD Patient?  No    PAD/SET Patient?  No      Pain Assessment   Currently in Pain?  No/denies          Social History   Tobacco Use  Smoking Status Never Smoker  Smokeless Tobacco Never Used    Goals Met:  Independence with exercise equipment Exercise tolerated well No report of cardiac concerns or symptoms Strength training completed today  Goals Unmet:  Not Applicable  Comments: Pt able to follow exercise prescription today without complaint.  Will continue to monitor for progression.    Dr. Emily Filbert is Medical Director for [place] and [facility].

## 2019-01-28 NOTE — Telephone Encounter (Signed)
CMN SIGNED AND PLACED IN [place] FOLDER. °

## 2019-01-29 ENCOUNTER — Ambulatory Visit: Payer: Medicaid Other

## 2019-01-29 ENCOUNTER — Other Ambulatory Visit: Payer: Self-pay

## 2019-01-29 DIAGNOSIS — I509 Heart failure, unspecified: Secondary | ICD-10-CM

## 2019-01-29 DIAGNOSIS — I5022 Chronic systolic (congestive) heart failure: Secondary | ICD-10-CM

## 2019-02-01 ENCOUNTER — Encounter: Payer: Medicaid Other | Admitting: *Deleted

## 2019-02-01 ENCOUNTER — Other Ambulatory Visit: Payer: Self-pay

## 2019-02-01 DIAGNOSIS — I5022 Chronic systolic (congestive) heart failure: Secondary | ICD-10-CM | POA: Diagnosis not present

## 2019-02-01 NOTE — Progress Notes (Signed)
Daily Session Note  Patient Details  Name: Joel [name] MRN: [id number] Date of Birth: [birth year] Referring Provider:     Cardiac Rehab from 11/30/2018 in [facility] Cardiac and Pulmonary Rehab  Referring Provider  Gollan      Encounter Date: 02/01/2019  Check In: Session Check In - 02/01/19 1625      Check-In   Supervising physician immediately available to respond to emergencies  See telemetry face sheet for immediately available ER MD    Location  [facility]-Cardiac & Pulmonary Rehab    Staff Present  Renita Papa, RN Moises Blood, BS, ACSM CEP, Exercise Physiologist;Joseph Tessie Fass RCP,RRT,BSRT    Virtual Visit  No    Medication changes reported      No    Fall or balance concerns reported     No    Warm-up and Cool-down  Performed on first and last piece of equipment    Resistance Training Performed  Yes    VAD Patient?  No    PAD/SET Patient?  No      Pain Assessment   Currently in Pain?  No/denies          Social History   Tobacco Use  Smoking Status Never Smoker  Smokeless Tobacco Never Used    Goals Met:  Independence with exercise equipment Exercise tolerated well No report of cardiac concerns or symptoms Strength training completed today  Goals Unmet:  Not Applicable  Comments: Pt able to follow exercise prescription today without complaint.  Will continue to monitor for progression.    Dr. Emily Filbert is Medical Director for [place] and [facility].

## 2019-02-08 ENCOUNTER — Ambulatory Visit: Payer: Medicaid Other

## 2019-02-10 ENCOUNTER — Encounter: Payer: Self-pay | Admitting: *Deleted

## 2019-02-10 DIAGNOSIS — I5022 Chronic systolic (congestive) heart failure: Secondary | ICD-10-CM

## 2019-02-10 NOTE — Progress Notes (Signed)
Cardiac Individual Treatment Plan  Patient Details  Name: Joel [name] MRN: [id number] Date of Birth: Dec 16, 1980 Referring Provider:     Cardiac Rehab from 11/30/2018 in [facility]  Referring Provider  Gollan      Initial Encounter Date:    Cardiac Rehab from 11/30/2018 in [facility]  Date  11/30/18      Visit Diagnosis: Heart failure, chronic systolic ([place])  Patient's Home Medications on Admission:  Current Outpatient Medications:  .  amLODipine (NORVASC) 5 MG tablet, Take 1 tablet (5 mg total) by mouth daily., Disp: 90 tablet, Rfl: 3 .  aspirin 81 MG chewable tablet, Chew 1 tablet (81 mg total) by mouth daily., Disp: , Rfl:  .  atorvastatin (LIPITOR) 80 MG tablet, Take 1 tablet (80 mg total) by mouth daily at 6 PM., Disp: 90 tablet, Rfl: 3 .  carvedilol (COREG) 25 MG tablet, Take 1 tablet (25 mg total) by mouth 2 (two) times daily with a meal., Disp: 180 tablet, Rfl: 3 .  empagliflozin (JARDIANCE) 25 MG TABS tablet, Take by mouth., Disp: , Rfl:  .  esomeprazole (NEXIUM) 20 MG capsule, Take 20 mg by mouth daily at 12 noon., Disp: , Rfl:  .  HUMALOG 100 UNIT/ML injection, 10 units at breakfast 10 units at lunch 10 units at dinner, Disp: , Rfl:  .  insulin aspart (NOVOLOG) 100 UNIT/ML injection, Inject 5 Units into the skin 3 (three) times daily with meals., Disp: 10 mL, Rfl: 11 .  insulin detemir (LEVEMIR) 100 UNIT/ML injection, Inject 0.45 mLs (45 Units total) into the skin 2 (two) times daily., Disp: 20 mL, Rfl: 2 .  liraglutide (VICTOZA) 18 MG/3ML SOPN, Inject into the skin., Disp: , Rfl:  .  sacubitril-valsartan (ENTRESTO) 97-103 MG, Take 1 tablet by mouth 2 (two) times daily., Disp: 180 tablet, Rfl: 3 .  spironolactone (ALDACTONE) 25 MG tablet, Take 25 mg by mouth daily., Disp: , Rfl:   Past Medical History: Past Medical History:  Diagnosis Date  . Anxiety   . Chronic diastolic CHF (congestive heart failure) ([place])   . Diabetes  mellitus without complication ([place])   . Hyperlipidemia   . Hypertension   . Hypertensive heart disease   . Noncompliance with medications   . Obesity   . Stroke ([place])   . Systolic dysfunction    a. TTE 06/2014: EF 45-50%, normal wall motion, mild MR, mild AI    Tobacco Use: Social History   Tobacco Use  Smoking Status Never Smoker  Smokeless Tobacco Never Used    Labs: Recent Review Flowsheet Data    Labs for ITP Cardiac and Pulmonary Rehab Latest Ref Rng & Units 06/11/2014 10/11/2014 02/12/2017 02/13/2017 12/15/2017   Cholestrol 0 - 200 mg/dL 361(H) - - 435(H) -   LDLCALC 0 - 99 mg/dL 271(H) - - 352(H) -   HDL >40 mg/dL 41 - - 30(L) -   Trlycerides <150 mg/dL 246(H) - - 267(H) -   Hemoglobin A1c 4.8 - 5.6 % - 7.3(H) 14.7(H) - 13.3(H)       Exercise Target Goals: Exercise Program Goal: Individual exercise prescription set using results from initial 6 min walk test and THRR while considering  patient's activity barriers and safety.   Exercise Prescription Goal: Initial exercise prescription builds to 30-45 minutes a day of aerobic activity, 2-3 days per week.  Home exercise guidelines will be given to patient during program as part of exercise prescription that the participant  will acknowledge.  Activity Barriers & Risk Stratification: Activity Barriers & Cardiac Risk Stratification - 11/13/18 1313      Activity Barriers & Cardiac Risk Stratification   Activity Barriers  None    Cardiac Risk Stratification  High       6 Minute Walk: 6 Minute Walk    Row Name 11/30/18 1431         6 Minute Walk   Phase  Initial     Distance  1120 feet     Walk Time  6 minutes     # of Rest Breaks  0     MPH  2.12     METS  3.1     RPE  9     Perceived Dyspnea   1     VO2 Peak  10.8     Symptoms  No     Resting HR  97 bpm     Resting BP  124/84     Resting Oxygen Saturation   97 %     Exercise Oxygen Saturation  during 6 min walk  99 %     Max Ex. HR  114 bpm     Max Ex. BP   126/88     2 Minute Post BP  122/84        Oxygen Initial Assessment:   Oxygen Re-Evaluation:   Oxygen Discharge (Final Oxygen Re-Evaluation):   Initial Exercise Prescription: Initial Exercise Prescription - 11/30/18 1400      Date of Initial Exercise RX and Referring Provider   Date  11/30/18    Referring Provider  Gollan      Treadmill   MPH  2.1    Grade  1.5    Minutes  15    METs  3      Recumbant Bike   Level  3    RPM  60    Watts  53    Minutes  15    METs  3      NuStep   Level  4    SPM  80    Minutes  15    METs  3      Recumbant Elliptical   Level  2    RPM  50    Minutes  15    METs  3      REL-XR   Level  3    Speed  50    Minutes  15    METs  3      T5 Nustep   Level  2    SPM  80    Minutes  15    METs  3      Prescription Details   Frequency (times per week)  3      Intensity   THRR 40-80% of Max Heartrate  131-166    Ratings of Perceived Exertion  11-13    Perceived Dyspnea  0-4      Resistance Training   Training Prescription  Yes    Weight  3 lb    Reps  10-15       Perform Capillary Blood Glucose checks as needed.  Exercise Prescription Changes: Exercise Prescription Changes    Row Name 11/30/18 1400 12/10/18 1400 12/23/18 1200 01/05/19 1400 01/20/19 1500     Response to Exercise   Blood Pressure (Admit)  124/84  136/86  142/88  148/100  144/96   Blood Pressure (Exercise)  126/88  148/82  128/60  162/90  168/80   Blood Pressure (Exit)  122/84  132/82  122/90  134/92  134/92   Heart Rate (Admit)  97 bpm  82 bpm  89 bpm  60 bpm  100 bpm   Heart Rate (Exercise)  114 bpm  129 bpm  133 bpm  132 bpm  133 bpm   Heart Rate (Exit)  99 bpm  122 bpm  111 bpm  121 bpm  105 bpm   Oxygen Saturation (Admit)  97 %  --  --  --  --   Oxygen Saturation (Exercise)  99 %  --  --  --  --   Rating of Perceived Exertion (Exercise)  9  11  13  17  13    Perceived Dyspnea (Exercise)  1  --  --  --  --   Symptoms  none  none  none   none  none   Duration  --  Progress to 30 minutes of  aerobic without signs/symptoms of physical distress  Continue with 30 min of aerobic exercise without signs/symptoms of physical distress.  Continue with 30 min of aerobic exercise without signs/symptoms of physical distress.  Continue with 30 min of aerobic exercise without signs/symptoms of physical distress.   Intensity  --  THRR unchanged  THRR unchanged  THRR unchanged  THRR unchanged     Progression   Progression  --  Continue to progress workloads to maintain intensity without signs/symptoms of physical distress.  Continue to progress workloads to maintain intensity without signs/symptoms of physical distress.  Continue to progress workloads to maintain intensity without signs/symptoms of physical distress.  Continue to progress workloads to maintain intensity without signs/symptoms of physical distress.   Average METs  --  2.6  3.35  4.27  4.61     Resistance Training   Training Prescription  --  Yes  Yes  Yes  Yes   Weight  --  3 lb  3 lb  4 lb  4 lb   Reps  --  10-15  10-15  10-15  10-15     Interval Training   Interval Training  --  --  No  No  No     Treadmill   MPH  --  2.1  2.1  2.1  2.8   Grade  --  1.5  1.5  1.5  1.5   Minutes  --  15  15  15  15    METs  --  3  3.3  3.3  3.75     Recumbant Bike   Level  --  --  3  --  --   Minutes  --  --  15  --  --   METs  --  --  3  --  --     NuStep   Level  --  2  --  --  4   SPM  --  80  --  --  --   Minutes  --  15  --  --  15   METs  --  2.2  --  --  5.4     Recumbant Elliptical   Level  --  --  --  --  3   Minutes  --  --  --  --  15   METs  --  --  --  --  3.2     REL-XR   Level  --  3  2  6   6  Speed  --  50  --  --  --   Minutes  --  15  15  15  15    METs  --  1.3  4.1  5.5  --     T5 Nustep   Level  --  --  4  --  4   Minutes  --  --  15  --  15   METs  --  --  3.2  --  --     Home Exercise Plan   Plans to continue exercise at  --  --  [employer] (comment) walking, [employer] (comment) walking, [employer] (comment) walking, [employer]  --  --  Add 2 additional days to program exercise sessions.  Add 2 additional days to program exercise sessions.  Add 2 additional days to program exercise sessions.   Initial Home Exercises Provided  --  --  12/14/18  12/14/18  12/14/18   Row Name 02/02/19 1500             Response to Exercise   Blood Pressure (Admit)  122/82       Blood Pressure (Exercise)  158/82       Blood Pressure (Exit)  100/72       Heart Rate (Admit)  72 bpm       Heart Rate (Exercise)  139 bpm       Heart Rate (Exit)  126 bpm       Rating of Perceived Exertion (Exercise)  13       Symptoms  none       Duration  Continue with 30 min of aerobic exercise without signs/symptoms of physical distress.       Intensity  THRR unchanged         Progression   Progression  Continue to progress workloads to maintain intensity without signs/symptoms of physical distress.       Average METs  4.3         Resistance Training   Training Prescription  Yes       Weight  4 lb       Reps  10-15         Interval Training   Interval Training  No         Treadmill   MPH  2.8       Grade  1.5       Minutes  15       METs  3.75         REL-XR   Level  6       Minutes  15       METs  4.3         T5 Nustep   Level  4       SPM  80       Minutes  15       METs  3         Home Exercise Plan   Plans to continue exercise at  [employer] (comment) walking, [employer]       Frequency  Add 2 additional days to program exercise sessions.       Initial Home Exercises Provided  12/14/18          Exercise Comments: Exercise Comments    Row Name 12/14/18 1708           Exercise Comments  Reviewed home exercise  with pt today.  Pt plans to go to [employer] for exercise.  Reviewed THR, pulse, RPE, sign and symptoms, NTG use, and when to call  911 or MD.  Also discussed weather considerations and indoor options.  Pt voiced understanding.          Exercise Goals and Review: Exercise Goals    Row Name 11/30/18 1438             Exercise Goals   Increase Physical Activity  Yes       Intervention  Provide advice, education, support and counseling about physical activity/exercise needs.;Develop an individualized exercise prescription for aerobic and resistive training based on initial evaluation findings, risk stratification, comorbidities and participant's personal goals.       Expected Outcomes  Short Term: Attend rehab on a regular basis to increase amount of physical activity.;Long Term: Add in home exercise to make exercise part of routine and to increase amount of physical activity.;Long Term: Exercising regularly at least 3-5 days a week.       Increase Strength and Stamina  Yes       Intervention  Provide advice, education, support and counseling about physical activity/exercise needs.;Develop an individualized exercise prescription for aerobic and resistive training based on initial evaluation findings, risk stratification, comorbidities and participant's personal goals.       Expected Outcomes  Short Term: Increase workloads from initial exercise prescription for resistance, speed, and METs.;Short Term: Perform resistance training exercises routinely during rehab and add in resistance training at home;Long Term: Improve cardiorespiratory fitness, muscular endurance and strength as measured by increased METs and functional capacity (6MWT)       Able to understand and use rate of perceived exertion (RPE) scale  Yes       Intervention  Provide education and explanation on how to use RPE scale       Expected Outcomes  Short Term: Able to use RPE daily in rehab to express subjective intensity level;Long Term:  Able to use RPE to guide intensity level when exercising independently       Able to understand and use Dyspnea scale  Yes        Intervention  Provide education and explanation on how to use Dyspnea scale       Expected Outcomes  Short Term: Able to use Dyspnea scale daily in rehab to express subjective sense of shortness of breath during exertion;Long Term: Able to use Dyspnea scale to guide intensity level when exercising independently       Knowledge and understanding of Target Heart Rate Range (THRR)  Yes       Intervention  Provide education and explanation of THRR including how the numbers were predicted and where they are located for reference       Expected Outcomes  Short Term: Able to state/look up THRR;Short Term: Able to use daily as guideline for intensity in rehab;Long Term: Able to use THRR to govern intensity when exercising independently       Able to check pulse independently  Yes       Intervention  Provide education and demonstration on how to check pulse in carotid and radial arteries.;Review the importance of being able to check your own pulse for safety during independent exercise       Expected Outcomes  Short Term: Able to explain why pulse checking is important during independent exercise;Long Term: Able to check pulse independently and accurately       Understanding of Exercise  Prescription  Yes       Intervention  Provide education, explanation, and written materials on patient's individual exercise prescription       Expected Outcomes  Short Term: Able to explain program exercise prescription;Long Term: Able to explain home exercise prescription to exercise independently          Exercise Goals Re-Evaluation : Exercise Goals Re-Evaluation    Row Name 12/02/18 1703 12/10/18 1441 12/14/18 1709 12/23/18 1235 12/28/18 1607     Exercise Goal Re-Evaluation   Exercise Goals Review  Increase Physical Activity;Able to understand and use rate of perceived exertion (RPE) scale;Knowledge and understanding of Target Heart Rate Range (THRR);Understanding of Exercise Prescription;Increase Strength and  Stamina;Able to check pulse independently  Increase Physical Activity;Increase Strength and Stamina;Able to understand and use rate of perceived exertion (RPE) scale;Knowledge and understanding of Target Heart Rate Range (THRR);Able to check pulse independently;Understanding of Exercise Prescription  Increase Physical Activity;Increase Strength and Stamina;Able to understand and use rate of perceived exertion (RPE) scale;Knowledge and understanding of Target Heart Rate Range (THRR);Able to check pulse independently;Understanding of Exercise Prescription  Increase Physical Activity;Increase Strength and Stamina;Understanding of Exercise Prescription  Increase Physical Activity;Increase Strength and Stamina;Understanding of Exercise Prescription   Comments  Reviewed RPE scale, THR and program prescription with pt today.  Pt voiced understanding and was given a copy of goals to take home.  Skip tolerates exercise well and seems to enjoy sessions.  Reviewed home exercise with pt today.  Pt plans to go to [employer] for exercise.  Reviewed THR, pulse, RPE, sign and symptoms, NTG use, and when to call 911 or MD.  Also discussed weather considerations and indoor options.  Pt voiced understanding.  Kolter has been doing well in rehab.  He is now on level 4 on the T5 NuStep.  He will be trying out the elliptical this week or next.  We will continue to monitor his progress.  --   Expected Outcomes  Short: Use RPE daily to regulate intensity. Long: Follow program prescription in THR.  Short - attend HT consistently Long :  increase MET level  Short - warm up and cool down correctly and check HR while at PF Long - maintain exercise on his own  Short: Try ellipitical and increase TM.  Long: Continue to walk more and go to [employer] on off days.  --   [place] Name 12/28/18 1629 01/05/19 1459 01/20/19 1548 01/25/19 1704 02/02/19 1514     Exercise Goal Re-Evaluation   Exercise Goals Review  Increase Physical  Activity;Increase Strength and Stamina;Understanding of Exercise Prescription  Increase Physical Activity;Increase Strength and Stamina;Able to understand and use rate of perceived exertion (RPE) scale;Able to understand and use Dyspnea scale;Knowledge and understanding of Target Heart Rate Range (THRR);Able to check pulse independently;Understanding of Exercise Prescription  Increase Physical Activity;Increase Strength and Stamina;Understanding of Exercise Prescription  Increase Physical Activity;Increase Strength and Stamina  Increase Physical Activity;Increase Strength and Stamina;Able to understand and use rate of perceived exertion (RPE) scale;Knowledge and understanding of Target Heart Rate Range (THRR);Able to check pulse independently;Understanding of Exercise Prescription   Comments  Edem is making progress with exercise levels in class and tried XR machine today for the first time. He is exercising at [employer] on days he does not have rehab. Main goal with exercise continues to be weight loss.  Akira continues to improve MET levels on machines and is losing weight slowly.  He has moved up to 4 lb strength  training.  Most recent weight was 322 lb  Emilo has been doing well in rehab.  He is up to level 6 on the XR.  He is and level 3 on the recumbert elliptical.  We will continue to montior his progress.  Terrick is lifting 4 days a week apart from [facility]. He works out for about an hour and a half. Informed him that he needs rest. Patient understands instructions and why it is important to rest. Jabari wants to maintain his progress when the program is over. He wnats to lose weight and be able to walk faster.  Devarion is still very consistent with exercise.  He does circuit training on machines in the 10 rep range.   Expected Outcomes  Short: Short term weight goal 325 lbs. Long: Establish an independent exercise routine. Long term weight goal 315 lbs.  Short - continue current program Long 315 lb  and maintain exercise  Short: Talk about adding in intervals.  Long: Continue to work on weight loss.  Short: Able to go further with walking. Long: maintain and improve walking distance and run.  Short :  continue to be consistent with exercise Long : maintain  fitness when program is complete      Discharge Exercise Prescription (Final Exercise Prescription Changes): Exercise Prescription Changes - 02/02/19 1500      Response to Exercise   Blood Pressure (Admit)  122/82    Blood Pressure (Exercise)  158/82    Blood Pressure (Exit)  100/72    Heart Rate (Admit)  72 bpm    Heart Rate (Exercise)  139 bpm    Heart Rate (Exit)  126 bpm    Rating of Perceived Exertion (Exercise)  13    Symptoms  none    Duration  Continue with 30 min of aerobic exercise without signs/symptoms of physical distress.    Intensity  THRR unchanged      Progression   Progression  Continue to progress workloads to maintain intensity without signs/symptoms of physical distress.    Average METs  4.3      Resistance Training   Training Prescription  Yes    Weight  4 lb    Reps  10-15      Interval Training   Interval Training  No      Treadmill   MPH  2.8    Grade  1.5    Minutes  15    METs  3.75      REL-XR   Level  6    Minutes  15    METs  4.3      T5 Nustep   Level  4    SPM  80    Minutes  15    METs  3      Home Exercise Plan   Plans to continue exercise at  [employer] (comment)   walking, [employer]   Frequency  Add 2 additional days to program exercise sessions.    Initial Home Exercises Provided  12/14/18       Nutrition:  Target Goals: Understanding of nutrition guidelines, daily intake of sodium <1532m, cholesterol <2076m calories 30% from fat and 7% or less from saturated fats, daily to have 5 or more servings of fruits and vegetables.  Biometrics: Pre Biometrics - 11/30/18 1440      Pre Biometrics   Height  5' 8"  (1.727 m)    Weight  (!) 335 lb 12.8 oz  (152.3 kg)  BMI (Calculated)  51.07    Single Leg Stand  30 seconds        Nutrition Therapy Plan and Nutrition Goals: Nutrition Therapy & Goals - 12/03/18 1403      Nutrition Therapy   Diet  low Na, HH, DM diet    Protein (specify units)  120g    Fiber  30 grams    Whole Grain Foods  3 servings    Saturated Fats  12 max. grams    Fruits and Vegetables  5 servings/day    Sodium  1.5 grams      Personal Nutrition Goals   Nutrition Goal  ST: eat more consistent meals LT: manage BG, get healthy    Comments  Sometimes skips breakfast, eggs and bacon when do have it. no lunch, sugar free kool aid squirts in water. D: no rice bread and limited potatoes. Meat (chicken, fish, steak) and greens (squash, zucchini, green beans) with gravy sometimes. will normally bake or pan fry with oil. Pt reports having limited appetite after heart event. Hunger cues growling in stomach. Discussed high vs low BG, DM, HH eating and general recommendations like eating consistently to ensure pt gets enough protein and kcal as well as manage BG. Discussed importance of eating and exercise. Discussed set point weight and health outcomes.      Intervention Plan   Intervention  Prescribe, educate and counsel regarding individualized specific dietary modifications aiming towards targeted core components such as weight, hypertension, lipid management, diabetes, heart failure and other comorbidities.;Nutrition handout(s) given to patient.    Expected Outcomes  Short Term Goal: Understand basic principles of dietary content, such as calories, fat, sodium, cholesterol and nutrients.;Short Term Goal: A plan has been developed with personal nutrition goals set during dietitian appointment.;Long Term Goal: Adherence to prescribed nutrition plan.       Nutrition Assessments: Nutrition Assessments - 12/01/18 1410      MEDFICTS Scores   Pre Score  48       Nutrition Goals Re-Evaluation: Nutrition Goals Re-Evaluation     Row Name 01/06/19 1640 01/18/19 1628           Goals   Nutrition Goal  ST: add CHO before workout LT: manage BG, get healthy  ST: continue with current changes LT: manage BG, get healthy      Comment  Tuna after workouts. CHOs earlier in the day. Eats before 7pm. Energy been good, will feel faint when working out discussed adding a CHO before workout. Pt reports loving vegetables.  Pt reports energy is good. Pt reports doing well with diet and exercise and wants to continue. Discussed making sure getting enough calories and eating CHO before workout to fuel workout - a couple of times pt reports having low energy and dizziness when working out.      Expected Outcome  ST: add CHO before workout LT: manage BG, get healthy  ST: continue with current changes LT: manage BG, get healthy         Nutrition Goals Discharge (Final Nutrition Goals Re-Evaluation): Nutrition Goals Re-Evaluation - 01/18/19 1628      Goals   Nutrition Goal  ST: continue with current changes LT: manage BG, get healthy    Comment  Pt reports energy is good. Pt reports doing well with diet and exercise and wants to continue. Discussed making sure getting enough calories and eating CHO before workout to fuel workout - a couple of times pt reports having low energy and dizziness  when working out.    Expected Outcome  ST: continue with current changes LT: manage BG, get healthy       Psychosocial: Target Goals: Acknowledge presence or absence of significant depression and/or stress, maximize coping skills, provide positive support system. Participant is able to verbalize types and ability to use techniques and skills needed for reducing stress and depression.   Initial Review & Psychosocial Screening: Initial Psych Review & Screening - 11/13/18 1311      Initial Review   Current issues with  Current Stress Concerns    Source of Stress Concerns  Chronic Illness;Unable to participate in former interests or hobbies;Unable  to perform yard/household activities    Comments  Duwane is struggling with the issues related to his HF, like SOB and fatigue. He has a great support system and his wife helps him with managing his medications. He is looking forward to starting HT so he can gain back some control in his life      [place]?  Yes      Barriers   Psychosocial barriers to participate in program  There are no identifiable barriers or psychosocial needs.;The patient should benefit from training in stress management and relaxation.      Screening Interventions   Interventions  Encouraged to exercise;Provide feedback about the scores to participant;To provide support and resources with identified psychosocial needs    Expected Outcomes  Short Term goal: Utilizing psychosocial counselor, staff and physician to assist with identification of specific Stressors or current issues interfering with healing process. Setting desired goal for each stressor or current issue identified.;Long Term Goal: Stressors or current issues are controlled or eliminated.;Long Term goal: The participant improves quality of Life and PHQ9 Scores as seen by post scores and/or verbalization of changes;Short Term goal: Identification and review with participant of any Quality of Life or Depression concerns found by scoring the questionnaire.       Quality of Life Scores:  Quality of Life - 11/30/18 1448      Quality of Life   Select  Quality of Life      Quality of Life Scores   Health/Function Pre  6.93 %    Socioeconomic Pre  13.71 %    Psych/Spiritual Pre  5.42 %    Family Pre  16.1 %    GLOBAL Pre  9.48 %      Scores of 19 and below usually indicate a poorer quality of life in these areas.  A difference of  2-3 points is a clinically meaningful difference.  A difference of 2-3 points in the total score of the Quality of Life Index has been associated with significant improvement in overall quality of life,  self-image, physical symptoms, and general health in studies assessing change in quality of life.  PHQ-9: Recent Review Flowsheet Data    Depression screen [facility] 2/9 11/30/2018 03/22/2018 12/19/2017 12/15/2017 11/12/2017   Decreased Interest 2 3 0 0 0   Down, Depressed, Hopeless 3 2 0 0 1   PHQ - 2 Score 5 5 0 0 1   Altered sleeping 1 1 0 - -   Tired, decreased energy 3 3 0 - -   Change in appetite 3 3 0 - -   Feeling bad or failure about yourself  3 3 0 - -   Trouble concentrating 3 1 0 - -   Moving slowly or fidgety/restless 1 2 0 - -   Suicidal thoughts  3  2 0 - -   PHQ-9 Score 22 20 0 - -   Difficult doing work/chores Extremely dIfficult Extremely dIfficult Not difficult at all - -     Interpretation of Total Score  Total Score Depression Severity:  1-4 = Minimal depression, 5-9 = Mild depression, 10-14 = Moderate depression, 15-19 = Moderately severe depression, 20-27 = Severe depression   Psychosocial Evaluation and Intervention: Psychosocial Evaluation - 11/13/18 1318      Psychosocial Evaluation & Interventions   Comments  Andrei is struggling with the issues related to his HF, like SOB and fatigue. He has a great support system and his wife helps him with managing his medications. He is looking forward to starting HT so he can gain back some control in his life    Expected Outcomes  Short: Attend [facility] to gain confidence in exercising and feel more "like himself". Long: develop self care habits.       Psychosocial Re-Evaluation: Psychosocial Re-Evaluation    [place] Name 12/28/18 1618 01/25/19 1710           Psychosocial Re-Evaluation   Current issues with  History of Depression  Current Depression;Current Anxiety/Panic;Current Sleep Concerns      Comments  Patient reports he has always struggled some with depression in life but tries not to think too much about troubling things that could drag him down. Currently no reported new depression. Has a strong support system.   Raylon is getting his CPAP on Friday for sleep. He says this machine feels much better and he can tolerate it more. He only had 4 apnea events on his CPAP. He states that he does have anxiety but is on no medication to help him. He is going to speak to his doctor about his anxiety.      Expected Outcomes  Short: Continue to attend cardiac rehab regularly Long: Manage mood with healthy mental health routine and habits.  Short: speak to doctor about anxiety. Long: take medications as prescribed if prescribed regularly.      Interventions  --  Encouraged to attend Cardiac Rehabilitation for the exercise      Continue Psychosocial Services   Follow up required by staff  Follow up required by staff         Psychosocial Discharge (Final Psychosocial Re-Evaluation): Psychosocial Re-Evaluation - 01/25/19 1710      Psychosocial Re-Evaluation   Current issues with  Current Depression;Current Anxiety/Panic;Current Sleep Concerns    Comments  Jasmond is getting his CPAP on Friday for sleep. He says this machine feels much better and he can tolerate it more. He only had 4 apnea events on his CPAP. He states that he does have anxiety but is on no medication to help him. He is going to speak to his doctor about his anxiety.    Expected Outcomes  Short: speak to doctor about anxiety. Long: take medications as prescribed if prescribed regularly.    Interventions  Encouraged to attend Cardiac Rehabilitation for the exercise    Continue Psychosocial Services   Follow up required by staff       Vocational Rehabilitation: Provide vocational rehab assistance to qualifying candidates.   Vocational Rehab Evaluation & Intervention: Vocational Rehab - 11/13/18 1312      Initial Vocational Rehab Evaluation & Intervention   Assessment shows need for Vocational Rehabilitation  No       Education: Education Goals: Education classes will be provided on a variety of topics geared toward better understanding  of heart  health and risk factor modification. Participant will state understanding/return demonstration of topics presented as noted by education test scores.  Learning Barriers/Preferences: Learning Barriers/Preferences - 11/13/18 1311      Learning Barriers/Preferences   Learning Barriers  None    Learning Preferences  None       Education Topics:  AED/CPR: - Group verbal and written instruction with the use of models to demonstrate the basic use of the AED with the basic ABC's of resuscitation.   General Nutrition Guidelines/Fats and Fiber: -Group instruction provided by verbal, written material, models and posters to present the general guidelines for heart healthy nutrition. Gives an explanation and review of dietary fats and fiber.   Cardiac Rehab from 01/14/2019 in [facility] Cardiac and Pulmonary Rehab  Date  12/31/18  Educator  [facility]  Instruction Review Code  1- Verbalizes Understanding      Controlling Sodium/Reading Food Labels: -Group verbal and written material supporting the discussion of sodium use in heart healthy nutrition. Review and explanation with models, verbal and written materials for utilization of the food label.   Exercise Physiology & General Exercise Guidelines: - Group verbal and written instruction with models to review the exercise physiology of the cardiovascular system and associated critical values. Provides general exercise guidelines with specific guidelines to those with heart or lung disease.    Aerobic Exercise & Resistance Training: - Gives group verbal and written instruction on the various components of exercise. Focuses on aerobic and resistive training programs and the benefits of this training and how to safely progress through these programs..   Cardiac Rehab from 01/14/2019 in [facility] Cardiac and Pulmonary Rehab  Date  12/03/18  Educator  [facility]  Instruction Review Code  1- Verbalizes Understanding      Flexibility, Balance, Mind/Body  Relaxation: Provides group verbal/written instruction on the benefits of flexibility and balance training, including mind/body exercise modes such as yoga, pilates and tai chi.  Demonstration and skill practice provided.   Cardiac Rehab from 01/14/2019 in [facility] Cardiac and Pulmonary Rehab  Date  12/31/18 [Balance 12/3]  Educator  AS  Instruction Review Code  1- Verbalizes Understanding      Stress and Anxiety: - Provides group verbal and written instruction about the health risks of elevated stress and causes of high stress.  Discuss the correlation between heart/lung disease and anxiety and treatment options. Review healthy ways to manage with stress and anxiety.   Depression: - Provides group verbal and written instruction on the correlation between heart/lung disease and depressed mood, treatment options, and the stigmas associated with seeking treatment.   Anatomy & Physiology of the Heart: - Group verbal and written instruction and models provide basic cardiac anatomy and physiology, with the coronary electrical and arterial systems. Review of Valvular disease and Heart Failure   Cardiac Rehab from 01/14/2019 in [facility] Cardiac and Pulmonary Rehab  Date  01/14/19  Educator  [facility]  Instruction Review Code  1- Verbalizes Understanding      Cardiac Procedures: - Group verbal and written instruction to review commonly prescribed medications for heart disease. Reviews the medication, class of the drug, and side effects. Includes the steps to properly store meds and maintain the prescription regimen. (beta blockers and nitrates)   Cardiac Medications I: - Group verbal and written instruction to review commonly prescribed medications for heart disease. Reviews the medication, class of the drug, and side effects. Includes the steps to properly store meds and maintain the prescription regimen.  Cardiac Medications II: -Group verbal and written instruction to review commonly prescribed  medications for heart disease. Reviews the medication, class of the drug, and side effects. (all other drug classes)   Cardiac Rehab from 01/14/2019 in [facility] Cardiac and Pulmonary Rehab  Date  12/03/18  Educator  [facility]  Instruction Review Code  1- Verbalizes Understanding       Go Sex-Intimacy & Heart Disease, Get SMART - Goal Setting: - Group verbal and written instruction through game format to discuss heart disease and the return to sexual intimacy. Provides group verbal and written material to discuss and apply goal setting through the application of the S.M.A.R.T. Method.   Other Matters of the Heart: - Provides group verbal, written materials and models to describe Stable Angina and Peripheral Artery. Includes description of the disease process and treatment options available to the cardiac patient.   Exercise & Equipment Safety: - Individual verbal instruction and demonstration of equipment use and safety with use of the equipment.   Cardiac Rehab from 01/14/2019 in [facility] Cardiac and Pulmonary Rehab  Date  11/30/18  Educator  AS  Instruction Review Code  1- Verbalizes Understanding      Infection Prevention: - Provides verbal and written material to individual with discussion of infection control including proper hand washing and proper equipment cleaning during exercise session.   Cardiac Rehab from 01/14/2019 in [facility] Cardiac and Pulmonary Rehab  Date  11/30/18  Educator  AS  Instruction Review Code  1- Verbalizes Understanding      Falls Prevention: - Provides verbal and written material to individual with discussion of falls prevention and safety.   Cardiac Rehab from 01/14/2019 in [facility] Cardiac and Pulmonary Rehab  Date  11/30/18  Educator  AS  Instruction Review Code  1- Verbalizes Understanding      Diabetes: - Individual verbal and written instruction to review signs/symptoms of diabetes, desired ranges of glucose level fasting, after meals and with exercise.  Acknowledge that pre and post exercise glucose checks will be done for 3 sessions at entry of program.   Cardiac Rehab from 01/14/2019 in [facility] Cardiac and Pulmonary Rehab  Date  11/13/18  Educator  [facility]  Instruction Review Code  1- Verbalizes Understanding      Know Your Numbers and Risk Factors: -Group verbal and written instruction about important numbers in your health.  Discussion of what are risk factors and how they play a role in the disease process.  Review of Cholesterol, Blood Pressure, Diabetes, and BMI and the role they play in your overall health.   Cardiac Rehab from 01/14/2019 in [facility] Cardiac and Pulmonary Rehab  Date  12/03/18  Educator  [facility]  Instruction Review Code  1- Verbalizes Understanding      Sleep Hygiene: -Provides group verbal and written instruction about how sleep can affect your health.  Define sleep hygiene, discuss sleep cycles and impact of sleep habits. Review good sleep hygiene tips.    Other: -Provides group and verbal instruction on various topics (see comments)   Knowledge Questionnaire Score:   Core Components/Risk Factors/Patient Goals at Admission: Personal Goals and Risk Factors at Admission - 11/30/18 1452      Core Components/Risk Factors/Patient Goals on Admission    Weight Management  Yes;Obesity;Weight Loss    Intervention  Weight Management/Obesity: Establish reasonable short term and long term weight goals.    Admit Weight  335 lb 12.8 oz (152.3 kg)    Goal Weight: Short Term  325 lb (147.4  kg)    Goal Weight: Long Term  315 lb (142.9 kg)    Intervention  Provide education about signs/symptoms and action to take for hypo/hyperglycemia.;Provide education about proper nutrition, including hydration, and aerobic/resistive exercise prescription along with prescribed medications to achieve blood glucose in normal ranges: Fasting glucose 65-99 mg/dL    Expected Outcomes  Short Term: Participant verbalizes understanding of the signs/symptoms  and immediate care of hyper/hypoglycemia, proper foot care and importance of medication, aerobic/resistive exercise and nutrition plan for blood glucose control.;Long Term: Attainment of HbA1C < 7%.    Heart Failure  Yes    Intervention  Provide a combined exercise and nutrition program that is supplemented with education, support and counseling about heart failure. Directed toward relieving symptoms such as shortness of breath, decreased exercise tolerance, and extremity edema.    Expected Outcomes  Improve functional capacity of life;Short term: Attendance in program 2-3 days a week with increased exercise capacity. Reported lower sodium intake. Reported increased fruit and vegetable intake. Reports medication compliance.;Short term: Daily weights obtained and reported for increase. Utilizing diuretic protocols set by physician.;Long term: Adoption of self-care skills and reduction of barriers for early signs and symptoms recognition and intervention leading to self-care maintenance.    Intervention  Provide education on lifestyle modifcations including regular physical activity/exercise, weight management, moderate sodium restriction and increased consumption of fresh fruit, vegetables, and low fat dairy, alcohol moderation, and smoking cessation.;Monitor prescription use compliance.    Expected Outcomes  Short Term: Continued assessment and intervention until BP is < 140/36m HG in hypertensive participants. < 130/868mHG in hypertensive participants with diabetes, heart failure or chronic kidney disease.;Long Term: Maintenance of blood pressure at goal levels.    Intervention  Provide education and support for participant on nutrition & aerobic/resistive exercise along with prescribed medications to achieve LDL <7032mHDL >50m44m  Expected Outcomes  Short Term: Participant states understanding of desired cholesterol values and is compliant with medications prescribed. Participant is following exercise  prescription and nutrition guidelines.;Long Term: Cholesterol controlled with medications as prescribed, with individualized exercise RX and with personalized nutrition plan. Value goals: LDL < 70mg31mL > 40 mg.       Core Components/Risk Factors/Patient Goals Review:  Goals and Risk Factor Review    Row Name 12/28/18 1608 01/25/19 1716           Core Components/Risk Factors/Patient Goals Review   Personal Goals Review  Weight Management/Obesity;Heart Failure;Diabetes;Lipids;Hypertension  Weight Management/Obesity;Lipids;Hypertension;Heart Failure      Review  Weight was down to 328 lbs today. BrianDraganrts his weight has been steadily decreasing and he is working out on the days he does not have rehab and [employer]. BrianDawsonks his blood sugar at home and reports mostly stable numbers. He weighs every day to help manage heart failure. Patient reports taking all meds as perscribed to manage BP and Lipids.  Patient is doing well with diabetes and watching his diet for his lipids. He wants to lose more weight which he has done already. BrianTirastaying positive and is improving in [place] blood pressure has still be and the higher side. He states he does have a bit of anxiety which could be another cause for his higher blood pressure.      Expected Outcomes  Short: Short term weight goal 325. Long: Independently mange diabetes, heart failure, lipids, and hypertension with meds, exercise, and diet. Long term weight goal 315 lbs.  Short: lose weight to  299lbs. Long: weigh 200lbs.         Core Components/Risk Factors/Patient Goals at Discharge (Final Review):  Goals and Risk Factor Review - 01/25/19 1716      Core Components/Risk Factors/Patient Goals Review   Personal Goals Review  Weight Management/Obesity;Lipids;Hypertension;Heart Failure    Review  Patient is doing well with diabetes and watching his diet for his lipids. He wants to lose more weight which he has done already.  Clancey is staying positive and is improving in [place]. His blood pressure has still be and the higher side. He states he does have a bit of anxiety which could be another cause for his higher blood pressure.    Expected Outcomes  Short: lose weight to 299lbs. Long: weigh 200lbs.       ITP Comments: ITP Comments    Row Name 11/13/18 1338 11/30/18 1441 12/02/18 1703 12/03/18 1429 12/10/18 1720   ITP Comments  Virtual initial orientation completed. EP/RD orientation scheduled for 10/8 at 11am  Initial 6MWT complete.  ITP created and sent to Dr Sabra Heck  First full day of exercise!  Patient was oriented to gym and equipment including functions, settings, policies, and procedures.  Patient's individual exercise prescription and treatment plan were reviewed.  All starting workloads were established based on the results of the 6 minute walk test done at initial orientation visit.  The plan for exercise progression was also introduced and progression will be customized based on patient's performance and goals.  Initial RD eval complete  Jef was asking about weight gain. He was educated on fluid retention and heart failure. He was encouraged to weigh every day and was given a heart failure daily symptom check sheet.   Henry Name 12/16/18 0643 01/13/19 1019 02/10/19 0856       ITP Comments  30 day review completed. Continue with ITP sent to Dr. Emily Filbert, Medical Director of Cardiac and Pulmonary Rehab for review , changes as needed and signature.  30 day review competed . ITP sent to Dr Emily Filbert for review, changes as needed and ITP approval signature.  30 day review competed . ITP sent to Dr Emily Filbert for review, changes as needed and ITP approval signature        Comments:

## 2019-02-15 ENCOUNTER — Encounter: Payer: Self-pay | Admitting: Internal Medicine

## 2019-02-15 ENCOUNTER — Encounter: Payer: Medicaid Other | Attending: Cardiovascular Disease | Admitting: *Deleted

## 2019-02-15 ENCOUNTER — Other Ambulatory Visit: Payer: Self-pay

## 2019-02-15 DIAGNOSIS — I5022 Chronic systolic (congestive) heart failure: Secondary | ICD-10-CM | POA: Insufficient documentation

## 2019-02-15 NOTE — Progress Notes (Signed)
Virtual F/U call today. No rehab classes scheduled this week.

## 2019-02-16 ENCOUNTER — Encounter: Payer: Self-pay | Admitting: Internal Medicine

## 2019-02-16 NOTE — Telephone Encounter (Signed)
This message came to me and I have no idea what to do with it.

## 2019-02-17 ENCOUNTER — Other Ambulatory Visit: Payer: Self-pay | Admitting: Adult Health

## 2019-02-17 DIAGNOSIS — G4733 Obstructive sleep apnea (adult) (pediatric): Secondary | ICD-10-CM

## 2019-02-17 NOTE — Telephone Encounter (Signed)
Can you help with this?

## 2019-02-17 NOTE — Progress Notes (Signed)
Patients cpap pressure changed from 5-20, to 5-15 CWP. Will follow up.

## 2019-02-19 ENCOUNTER — Encounter: Payer: Self-pay | Admitting: Family Medicine

## 2019-02-19 ENCOUNTER — Ambulatory Visit (INDEPENDENT_AMBULATORY_CARE_PROVIDER_SITE_OTHER): Payer: Medicaid Other | Admitting: Family Medicine

## 2019-02-19 ENCOUNTER — Other Ambulatory Visit: Payer: Self-pay

## 2019-02-19 DIAGNOSIS — H6981 Other specified disorders of Eustachian tube, right ear: Secondary | ICD-10-CM

## 2019-02-19 DIAGNOSIS — R42 Dizziness and giddiness: Secondary | ICD-10-CM | POA: Diagnosis not present

## 2019-02-19 DIAGNOSIS — H65191 Other acute nonsuppurative otitis media, right ear: Secondary | ICD-10-CM

## 2019-02-19 MED ORDER — AMOXICILLIN 500 MG PO CAPS: 500.0000 mg | ORAL_CAPSULE | Freq: Two times a day (BID) | ORAL | 0 refills | Status: DC

## 2019-02-19 MED ORDER — FLUTICASONE PROPIONATE 50 MCG/ACT NA SUSP: 2.0000 | Freq: Every day | NASAL | 3 refills | Status: DC

## 2019-02-19 NOTE — Patient Instructions (Addendum)
You have some Eustachian Tube Dysfunction, this problem is usually caused by some deeper sinus swelling and pressure, causing difficulty of eustachian tubes to clear fluid from behind ear drum. You can have ear pain, pressure, fullness, loss of hearing. Often related to sinus symptoms and sometimes with sinusitis or infection or allergy symptoms.  Treatment: - Start using nasal steroid spray, Flonase 2 sprays in each nostril every day for at least 4-6 weeks, and maybe longer - Start OTC allergy medicine (Claritin, Zyrtec, or Allegra - or generics) once daily - For significant ear/sinus pressure, try OTC decongestant such as Phenylephrine or similar medicines, included in DayQuil, take this for up to 1 week or less, no longer - Try to limit future use of Afrin nasal spray, if you stop it cold [place] it can cause REBOUND sinus swelling  If any significant worsening, loss of hearing, constant pain, fever/chills, or concern for infection - notify office and we can send in an antibiotic. Or if just persistent pressure that is not improving, you may contact me back within 1 week and we can consider a brief course of oral steroid prednisone for 3 days only to help reduce swelling, as discussed this is not ideal treatment and can cause side effects.    You have symptoms of Vertigo (Benign Paroxysmal Positional Vertigo) - This is commonly caused by inner ear fluid imbalance, sometimes can be worsened by allergies and sinus symptoms, otherwise it can occur randomly sometimes and we may never discover the exact cause. - To treat this, try the Epley Manuever (see diagrams/instructions below) at home up to 3 times a day for 1-2 weeks or until symptoms resolve ONLY IF NEEDED- You may take OTC Dramamine or Meclizine as needed up to 3 times a day for dizziness, this will not cure symptoms but may help. Caution may make you drowsy.  If you develop significant worsening episode with vertigo that does not improve and  you get severe headache, loss of vision, arm or leg weakness, slurred speech, or other concerning symptoms please seek immediate medical attention at Emergency Department.   See the next page for images describing the Epley Manuever.     ----------------------------------------------------------------------------------------------------------------------       Please schedule a Follow-up Appointment to: Return in about 1 week (around 02/26/2019), or if symptoms worsen or fail to improve, for ear.  If you have any other questions or concerns, please feel free to call the office or send a message through MyChart. You may also schedule an earlier appointment if necessary.  Additionally, you may be receiving a survey about your experience at our office within a few days to 1 week by e-mail or mail. We value your feedback.  Saralyn Pilar, DO [facility], [place]

## 2019-02-19 NOTE — Telephone Encounter (Signed)
Apt scheduled for today 02/19/19  Saralyn Pilar, DO [facility] 02/19/2019, 10:03 AM

## 2019-02-19 NOTE — Progress Notes (Signed)
Virtual Visit via Telephone The purpose of this virtual visit is to provide medical care while limiting exposure to the novel coronavirus (COVID19) for both patient and office staff.  Consent was obtained for phone visit:  Yes.   Answered questions that patient had about telehealth interaction:  Yes.   I discussed the limitations, risks, security and privacy concerns of performing an evaluation and management service by telephone. I also discussed with the patient that there may be a patient responsible charge related to this service. The patient expressed understanding and agreed to proceed.  Patient Location: Home Provider Location: [facility])  ---------------------------------------------------------------------- Chief Complaint  Patient presents with  . Dizziness     Rt ear popping x 1 week , dizziness x 2 days     S: Reviewed CMA documentation. I have called patient and gathered additional HPI as follows:  RIGHT Ear Inner Ear / Dizziness / Vertigo Previous history of some vertigo dizzy issues with felt like leaning to left. Now he describes unusual vision sensation of felt like cross eyed but he wasn't with R ear pressure and some sinus pressure affecting him, feels like balance is off at times, some room spinning, and no ear pain but still significant fullness occasionally R ear pops but not often. - He uses Afrin nasal spray OTC 1-2 times daily most often - He is not using nasal steroid - He has had prior history of Ear infection, asking about this   Denies any high risk travel to areas of current concern for COVID19. Denies any known or suspected exposure to person with or possibly with COVID19.  Denies any fevers, chills, sweats, body ache, cough, shortness of breath, sinus pain, headache, abdominal pain, diarrhea  Past Medical History:  Diagnosis Date  . Anxiety   . Chronic diastolic CHF (congestive heart failure) ([place])   . Diabetes mellitus  without complication ([place])   . Hyperlipidemia   . Hypertension   . Hypertensive heart disease   . Noncompliance with medications   . Obesity   . Stroke ([place])   . Systolic dysfunction    a. TTE 06/2014: EF 45-50%, normal wall motion, mild MR, mild AI   Social History   Tobacco Use  . Smoking status: Never Smoker  . Smokeless tobacco: Never Used  Substance Use Topics  . Alcohol use: Not Currently    Comment: Very infrequently; ~2-3 drinks/year   . Drug use: No    Current Outpatient Medications:  .  amLODipine (NORVASC) 5 MG tablet, Take 1 tablet (5 mg total) by mouth daily., Disp: 90 tablet, Rfl: 3 .  amoxicillin (AMOXIL) 500 MG capsule, Take 1 capsule (500 mg total) by mouth 2 (two) times daily. For 10 days, Disp: 20 capsule, Rfl: 0 .  aspirin 81 MG chewable tablet, Chew 1 tablet (81 mg total) by mouth daily., Disp: , Rfl:  .  atorvastatin (LIPITOR) 80 MG tablet, Take 1 tablet (80 mg total) by mouth daily at 6 PM., Disp: 90 tablet, Rfl: 3 .  carvedilol (COREG) 25 MG tablet, Take 1 tablet (25 mg total) by mouth 2 (two) times daily with a meal., Disp: 180 tablet, Rfl: 3 .  empagliflozin (JARDIANCE) 25 MG TABS tablet, Take by mouth., Disp: , Rfl:  .  esomeprazole (NEXIUM) 20 MG capsule, Take 20 mg by mouth daily at 12 noon., Disp: , Rfl:  .  fluticasone (FLONASE) 50 MCG/ACT nasal spray, Place 2 sprays into both nostrils daily. Use for 4-6 weeks  then stop and use seasonally or as needed., Disp: 16 g, Rfl: 3 .  HUMALOG 100 UNIT/ML injection, 10 units at breakfast 10 units at lunch 10 units at dinner, Disp: , Rfl:  .  insulin aspart (NOVOLOG) 100 UNIT/ML injection, Inject 5 Units into the skin 3 (three) times daily with meals., Disp: 10 mL, Rfl: 11 .  insulin detemir (LEVEMIR) 100 UNIT/ML injection, Inject 0.45 mLs (45 Units total) into the skin 2 (two) times daily., Disp: 20 mL, Rfl: 2 .  liraglutide (VICTOZA) 18 MG/3ML SOPN, Inject into the skin., Disp: , Rfl:  .  sacubitril-valsartan  (ENTRESTO) 97-103 MG, Take 1 tablet by mouth 2 (two) times daily., Disp: 180 tablet, Rfl: 3 .  spironolactone (ALDACTONE) 25 MG tablet, Take 25 mg by mouth daily., Disp: , Rfl:   Depression screen [facility] 2/9 11/30/2018 03/22/2018 12/19/2017  Decreased Interest 2 3 0  Down, Depressed, Hopeless 3 2 0  PHQ - 2 Score 5 5 0  Altered sleeping 1 1 0  Tired, decreased energy 3 3 0  Change in appetite 3 3 0  Feeling bad or failure about yourself  3 3 0  Trouble concentrating 3 1 0  Moving slowly or fidgety/restless 1 2 0  Suicidal thoughts 3 2 0  PHQ-9 Score 22 20 0  Difficult doing work/chores Extremely dIfficult Extremely dIfficult Not difficult at all    GAD 7 : Generalized Anxiety Score 12/28/2018 03/22/2018 12/15/2017  Nervous, Anxious, on Edge 0 3 0  Control/stop worrying 0 3 0  Worry too much - different things - 3 0  Trouble relaxing 0 1 0  Restless 0 2 0  Easily annoyed or irritable 0 3 -  Afraid - awful might happen 0 3 0  Total GAD 7 Score - 18 -  Anxiety Difficulty Not difficult at all Very difficult Not difficult at all    -------------------------------------------------------------------------- O: No physical exam performed due to remote telephone encounter.  Lab results reviewed.  Recent Results (from the past 2160 hour(s))  Glucose, capillary     Status: Abnormal   Collection Time: 12/02/18  4:33 PM  Result Value Ref Range   Glucose-Capillary 127 (H) 70 - 99 mg/dL  Glucose, capillary     Status: None   Collection Time: 12/02/18  5:42 PM  Result Value Ref Range   Glucose-Capillary 84 70 - 99 mg/dL  Glucose, capillary     Status: Abnormal   Collection Time: 12/03/18  5:03 PM  Result Value Ref Range   Glucose-Capillary 124 (H) 70 - 99 mg/dL  Glucose, capillary     Status: Abnormal   Collection Time: 12/03/18  5:57 PM  Result Value Ref Range   Glucose-Capillary 119 (H) 70 - 99 mg/dL  Glucose, capillary     Status: Abnormal   Collection Time: 12/07/18  4:42 PM  Result  Value Ref Range   Glucose-Capillary 115 (H) 70 - 99 mg/dL  Glucose, capillary     Status: None   Collection Time: 12/07/18  5:40 PM  Result Value Ref Range   Glucose-Capillary 87 70 - 99 mg/dL  Glucose, capillary     Status: Abnormal   Collection Time: 01/18/19  4:57 PM  Result Value Ref Range   Glucose-Capillary 43 (LL) 70 - 99 mg/dL   Comment 1 Notify RN   Glucose, capillary     Status: Abnormal   Collection Time: 01/18/19  5:14 PM  Result Value Ref Range   Glucose-Capillary 56 (L) 70 -  99 mg/dL  Glucose, capillary     Status: Abnormal   Collection Time: 01/18/19  5:31 PM  Result Value Ref Range   Glucose-Capillary 60 (L) 70 - 99 mg/dL  Glucose, capillary     Status: Abnormal   Collection Time: 01/18/19  5:47 PM  Result Value Ref Range   Glucose-Capillary 66 (L) 70 - 99 mg/dL    -------------------------------------------------------------------------- A&P:  Problem List Items Addressed This Visit    None    Visit Diagnoses    Other non-recurrent acute nonsuppurative otitis media of right ear    -  Primary   Relevant Medications   amoxicillin (AMOXIL) 500 MG capsule   fluticasone (FLONASE) 50 MCG/ACT nasal spray   Vertigo       Dysfunction of right eustachian tube       Relevant Medications   fluticasone (FLONASE) 50 MCG/ACT nasal spray     Acute R eustachian tube dysfunction with suspected secondary R ear effusion vs cannot rule out if AOM virtually, no fever or other suggestive symptoms. - Secondary vestibular symptoms possible vertigo component as well per history  Concern on regular Afrin use, can contribute  Plan: 1. Start empiric Amoxicillin 500mg  BID x 7 days as coverage for possible AOM as requested 2. Start Flonase 2 sprays each nare daily for up to 4-6 weeks or longer 3. Try OTC oral anti-histamine daily, Decongestant 4. LIMIT Afrin nasal, avoid rebound 5. Try Home Epley Maneuver, info sent on AVS 6. Additionally discussed potential benefit of oral  steroid, prednisone taper for short course up to 20mg  daily x 3 days, only if refractory symptoms, counseled on potential side effects - he declined now can contact back if need 7. Follow-up if not improved or worsening, return criteria - may refer to ENT if unresolved.    Meds ordered this encounter  Medications  . amoxicillin (AMOXIL) 500 MG capsule    Sig: Take 1 capsule (500 mg total) by mouth 2 (two) times daily. For 10 days    Dispense:  20 capsule    Refill:  0  . fluticasone (FLONASE) 50 MCG/ACT nasal spray    Sig: Place 2 sprays into both nostrils daily. Use for 4-6 weeks then stop and use seasonally or as needed.    Dispense:  16 g    Refill:  3    Follow-up: - Return in 1-2 week as needed  Patient verbalizes understanding with the above medical recommendations including the limitation of remote medical advice.  Specific follow-up and call-back criteria were given for patient to follow-up or seek medical care more urgently if needed.   - Time spent in direct consultation with patient on phone: 9 minutes  , DO [facility] 02/19/2019, 10:40 AM

## 2019-02-20 NOTE — Telephone Encounter (Signed)
thanks

## 2019-02-22 ENCOUNTER — Other Ambulatory Visit: Payer: Self-pay

## 2019-02-22 ENCOUNTER — Encounter: Payer: Medicaid Other | Admitting: *Deleted

## 2019-02-22 DIAGNOSIS — I5022 Chronic systolic (congestive) heart failure: Secondary | ICD-10-CM | POA: Diagnosis present

## 2019-02-22 NOTE — Progress Notes (Signed)
Daily Session Note  Patient Details  Name: Joel [name] MRN: [id number] Date of Birth: [birth year] Referring Provider:     Cardiac Rehab from 11/30/2018 in [facility] Card[facility]  Referring Provider  [place]      Encounter Date: 02/22/2019  Check In: Session Check In - 02/22/19 1703      Check-In   Supervising physician immediately available to respond to emergencies  See telemetry face sheet for immediately available ER MD    Location  [facility]-Cardiac & Pulmonary Rehab    Staff Present  Renita Papa, RN Moises Blood, BS, ACSM CEP, Exercise Physiologist;Joseph Tessie Fass RCP,RRT,BSRT    Virtual Visit  No    Medication changes reported      No    Fall or balance concerns reported     No    Warm-up and Cool-down  Performed on first and last piece of equipment    Resistance Training Performed  Yes    VAD Patient?  No    PAD/SET Patient?  No      Pain Assessment   Currently in Pain?  No/denies          Social History   Tobacco Use  Smoking Status Never Smoker  Smokeless Tobacco Never Used    Goals Met:  Independence with exercise equipment Exercise tolerated well No report of cardiac concerns or symptoms Strength training completed today  Goals Unmet:  Not Applicable  Comments: Pt able to follow exercise prescription today without complaint.  Will continue to monitor for progression.    Dr. Emily Filbert is Medical Director for [facility].

## 2019-02-23 ENCOUNTER — Other Ambulatory Visit: Payer: Self-pay

## 2019-02-23 ENCOUNTER — Emergency Department: Payer: Medicaid Other

## 2019-02-23 ENCOUNTER — Encounter: Payer: Self-pay | Admitting: Emergency Medicine

## 2019-02-23 ENCOUNTER — Emergency Department
Admission: EM | Admit: 2019-02-23 | Discharge: 2019-02-23 | Disposition: A | Discharge status: Home / Self Care | Payer: Medicaid Other | Attending: Emergency Medicine | Admitting: Emergency Medicine

## 2019-02-23 DIAGNOSIS — N183 Chronic kidney disease, stage 3 unspecified: Secondary | ICD-10-CM | POA: Insufficient documentation

## 2019-02-23 DIAGNOSIS — R42 Dizziness and giddiness: Secondary | ICD-10-CM | POA: Diagnosis present

## 2019-02-23 DIAGNOSIS — I5032 Chronic diastolic (congestive) heart failure: Secondary | ICD-10-CM | POA: Diagnosis not present

## 2019-02-23 DIAGNOSIS — E1022 Type 1 diabetes mellitus with diabetic chronic kidney disease: Secondary | ICD-10-CM | POA: Diagnosis not present

## 2019-02-23 DIAGNOSIS — I5022 Chronic systolic (congestive) heart failure: Secondary | ICD-10-CM | POA: Diagnosis not present

## 2019-02-23 DIAGNOSIS — I130 Hypertensive heart and chronic kidney disease with heart failure and stage 1 through stage 4 chronic kidney disease, or unspecified chronic kidney disease: Secondary | ICD-10-CM | POA: Diagnosis not present

## 2019-02-23 DIAGNOSIS — H938X1 Other specified disorders of right ear: Secondary | ICD-10-CM | POA: Diagnosis not present

## 2019-02-23 HISTORY — DX: Disorder of kidney and ureter, unspecified: N28.9

## 2019-02-23 LAB — CBC
HCT: 40.7 % (ref 39.0–52.0)
Hemoglobin: 13.2 g/dL (ref 13.0–17.0)
MCH: 26.7 pg (ref 26.0–34.0)
MCHC: 32.4 g/dL (ref 30.0–36.0)
MCV: 82.4 fL (ref 80.0–100.0)
Platelets: 271 10*3/uL (ref 150–400)
RBC: 4.94 MIL/uL (ref 4.22–5.81)
RDW: 15.4 % (ref 11.5–15.5)
WBC: 5.4 10*3/uL (ref 4.0–10.5)
nRBC: 0 % (ref 0.0–0.2)

## 2019-02-23 LAB — BASIC METABOLIC PANEL
Anion gap: 10 (ref 5–15)
BUN: 18 mg/dL (ref 6–20)
CO2: 23 mmol/L (ref 22–32)
Calcium: 9.1 mg/dL (ref 8.9–10.3)
Chloride: 106 mmol/L (ref 98–111)
Creatinine, Ser: 2.04 mg/dL — ABNORMAL HIGH (ref 0.61–1.24)
GFR calc Af Amer: 47 mL/min — ABNORMAL LOW (ref >60)
GFR calc non Af Amer: 40 mL/min — ABNORMAL LOW (ref >60)
Glucose, Bld: 120 mg/dL — ABNORMAL HIGH (ref 70–99)
Potassium: 3.7 mmol/L (ref 3.5–5.1)
Sodium: 139 mmol/L (ref 135–145)

## 2019-02-23 IMAGING — CT CT HEAD W/O CM
3 series · 15 of 47 positions shown, 18 images · non-contrast
Comparison: [DATE]

CLINICAL DATA: Dizziness and ataxia

EXAM:
CT HEAD WITHOUT CONTRAST
TECHNIQUE: Contiguous axial images were obtained from the base of the skull
through the vertex without intravenous contrast.

[Series 2: head wo · axial · 0.47mm/px · z∈[-157,-27]mm · 9 of 32 slices shown, 12 images]
[im 3/32  brain]
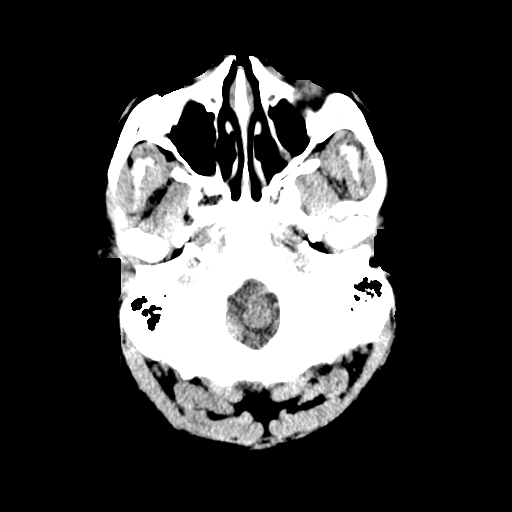
[im 3/32  bone]
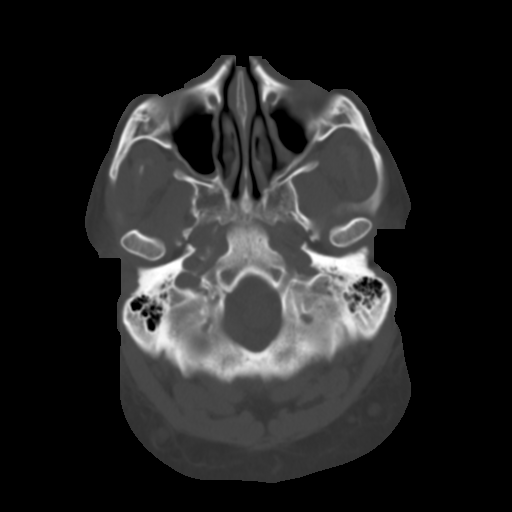
[im 6/32  brain]
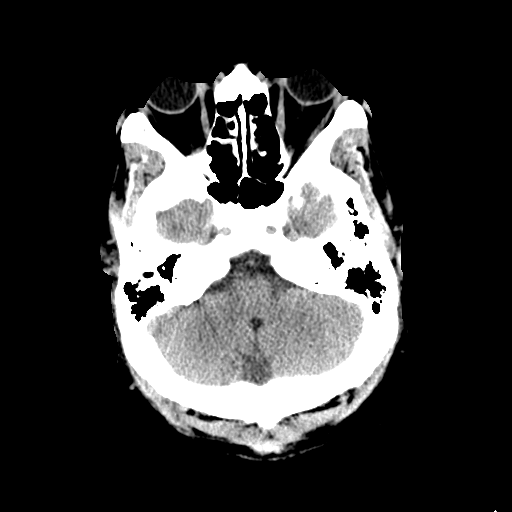
[im 9/32  brain]
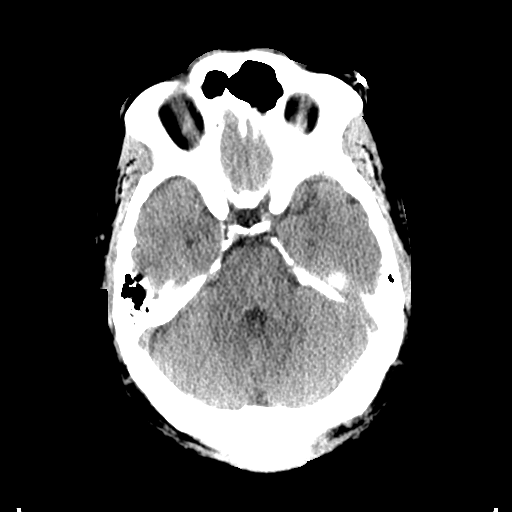
[im 12/32  brain]
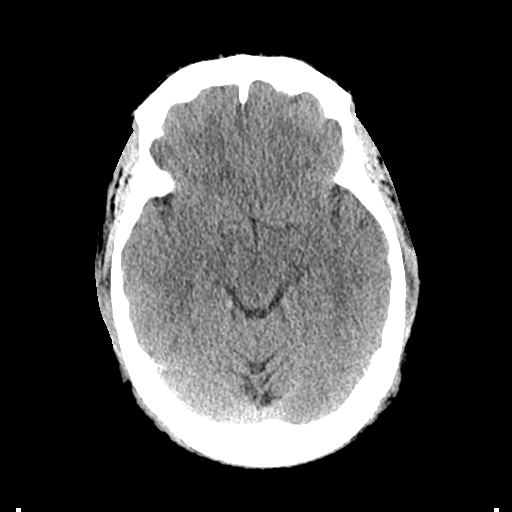
[im 17/32  brain]
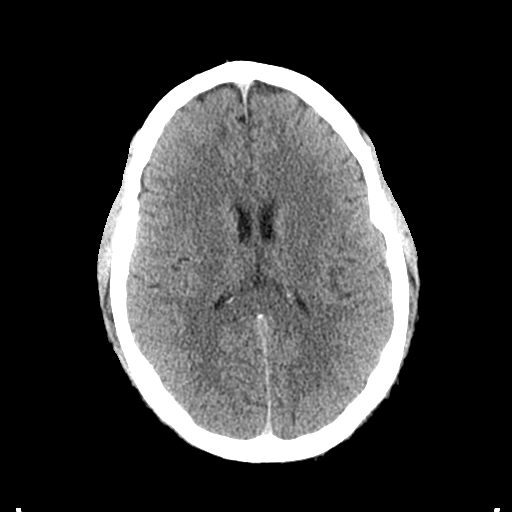
[im 17/32  bone]
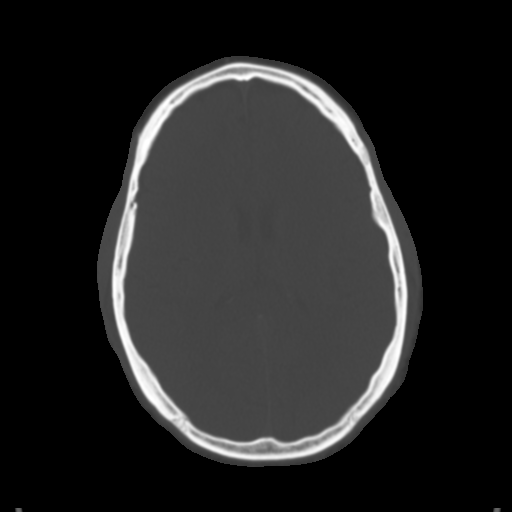
[im 20/32  brain]
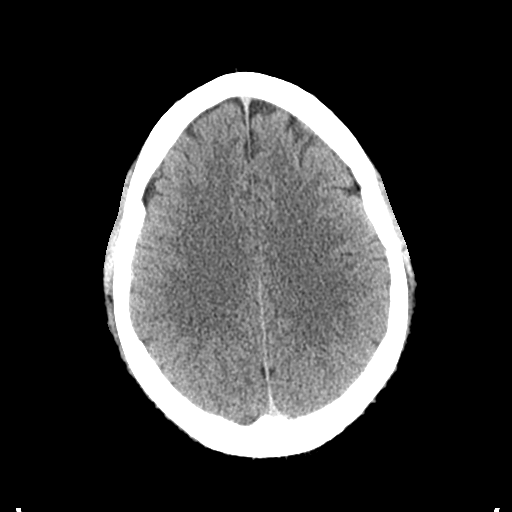
[im 23/32  brain]
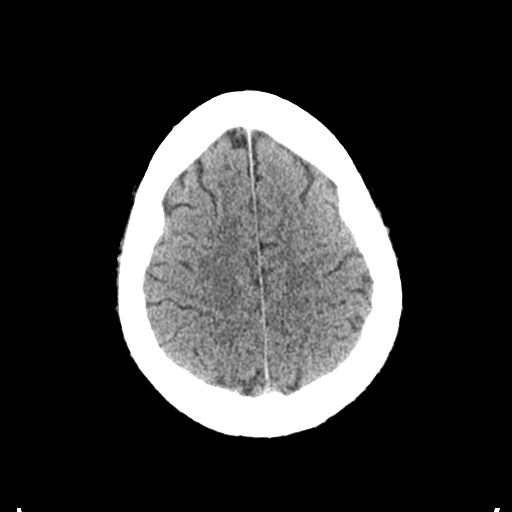
[im 26/32  brain]
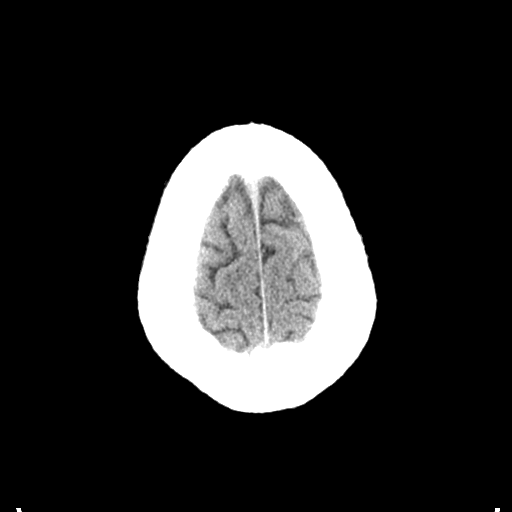
[im 29/32  brain]
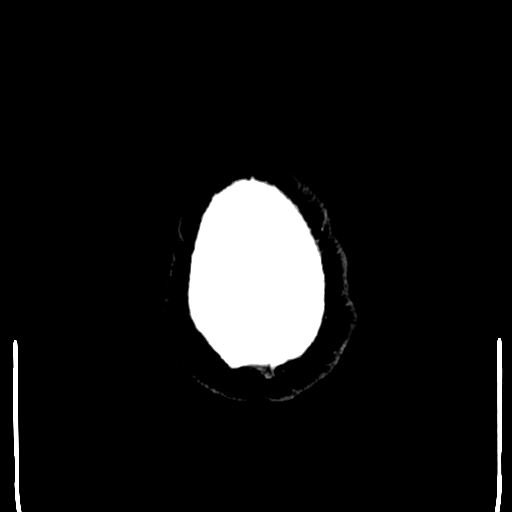
[im 29/32  bone]
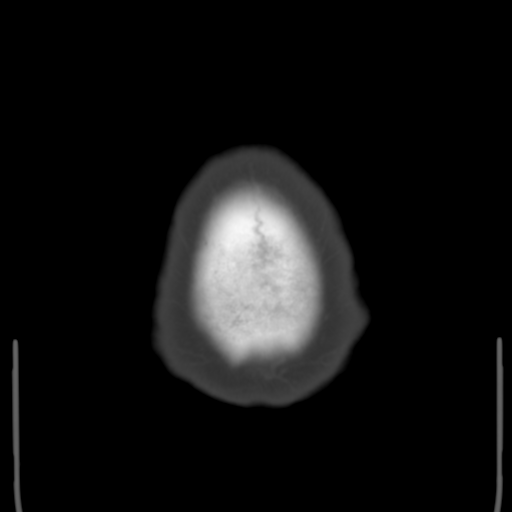

[Series 4: coronal soft tissue · coronal · 0.30mm/px · 3 of 74 slices shown]
[im 25/74  brain]
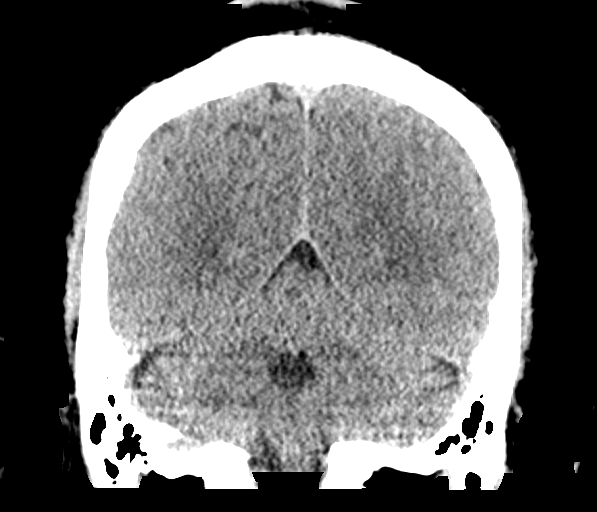
[im 33/74  brain]
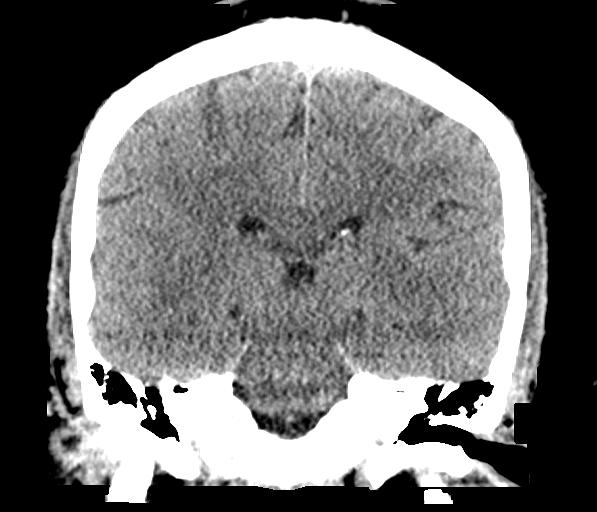
[im 41/74  brain]
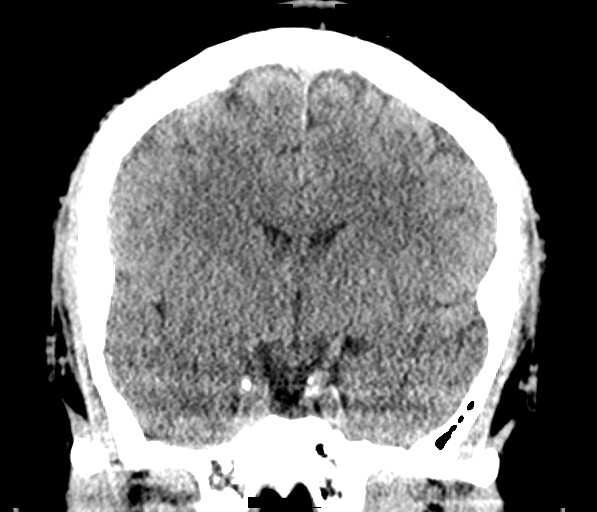

[Series 5: sagittal soft tissue · sagittal · 0.30mm/px · 3 of 61 slices shown]
[im 21/61  brain]
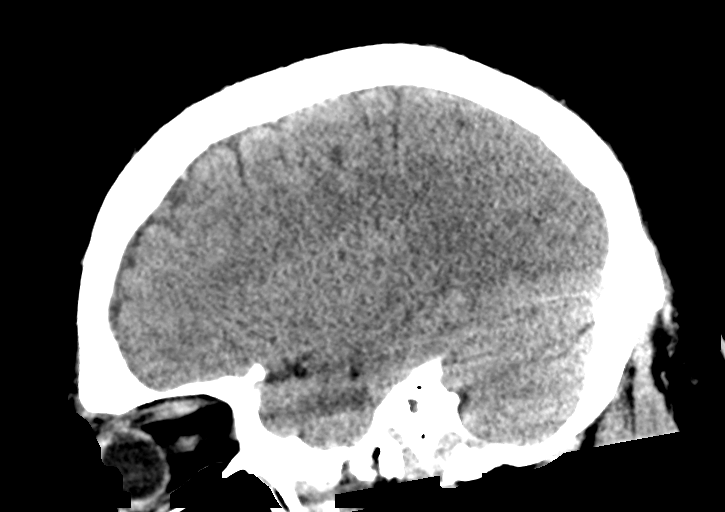
[im 31/61  brain]
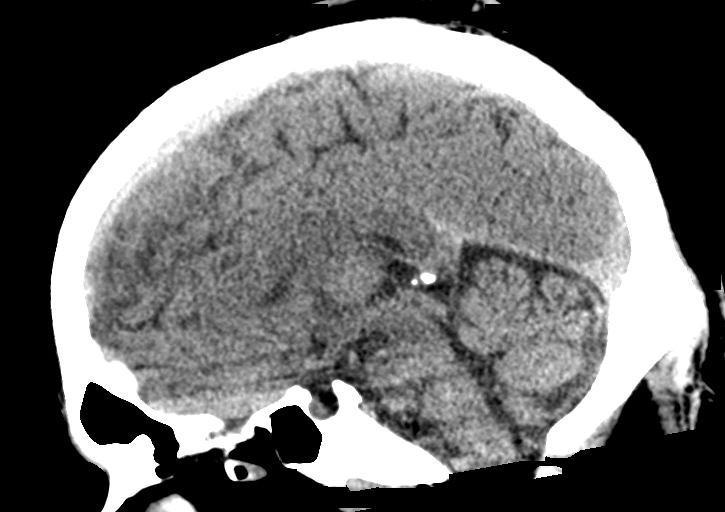
[im 41/61  brain]
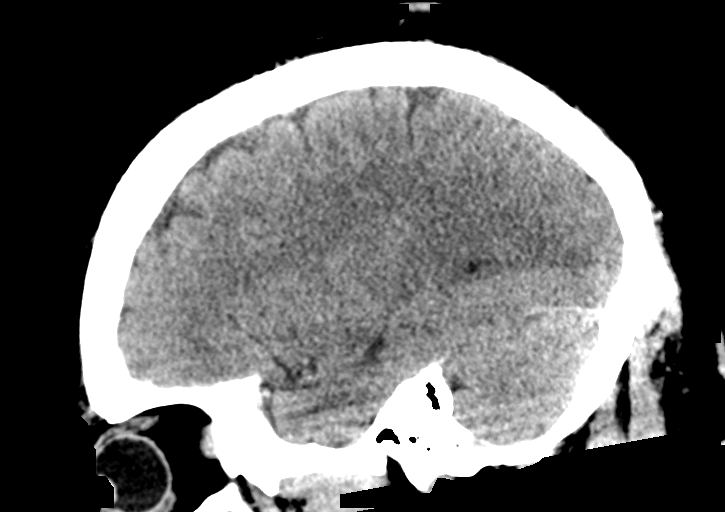

[15 of 47 positions shown; findings below may reference images not displayed]

FINDINGS: Brain: The ventricles are normal in size and configuration. There is
no intracranial mass, hemorrhage, extra-axial fluid collection, or
midline shift. The brain parenchyma appears unremarkable. There is
no demonstrable acute infarct.

Vascular: There is no hyperdense vessel. No vascular calcifications
are evident.

Skull: The bony calvarium appears intact.

Sinuses/Orbits: There is mucosal thickening in each maxillary
antrum. There is mucosal thickening and opacification in multiple
ethmoid air cells as well as patchy opacity in each sphenoid sinus.
Orbits appear symmetric bilaterally.

Other: Visualized mastoid air cells are clear.
IMPRESSION: Areas of paranasal sinus disease.  Study otherwise unremarkable.

## 2019-02-23 MED ORDER — PREDNISONE 50 MG PO TABS: ORAL_TABLET | ORAL | 0 refills | Status: DC

## 2019-02-23 MED ORDER — MECLIZINE HCL 25 MG PO TABS: 25.0000 mg | ORAL_TABLET | Freq: Three times a day (TID) | ORAL | 1 refills | Status: DC | PRN

## 2019-02-23 MED ORDER — MECLIZINE HCL 25 MG PO TABS
50.0000 mg | ORAL_TABLET | Freq: Once | ORAL | Status: AC
  Administered 2019-02-23: 50 mg via ORAL
  Filled 2019-02-23: qty 2

## 2019-02-23 MED ORDER — DIAZEPAM 5 MG PO TABS
5.0000 mg | ORAL_TABLET | Freq: Once | ORAL | Status: AC
  Administered 2019-02-23: 5 mg via ORAL
  Filled 2019-02-23: qty 1

## 2019-02-23 MED ORDER — DIAZEPAM 5 MG PO TABS: 5.0000 mg | ORAL_TABLET | Freq: Three times a day (TID) | ORAL | 0 refills | Status: DC | PRN

## 2019-02-23 MED ORDER — PREDNISONE 20 MG PO TABS
50.0000 mg | ORAL_TABLET | Freq: Once | ORAL | Status: AC
  Administered 2019-02-23: 12:00:00 50 mg via ORAL
  Filled 2019-02-23: qty 3

## 2019-02-23 NOTE — ED Provider Notes (Signed)
[facility] Emergency Department Provider Note       Time seen: ----------------------------------------- 12:22 PM on 02/23/2019 -----------------------------------------   I have reviewed the triage vital signs and the nursing notes.  HISTORY   Chief Complaint Dizziness and Ear Fullness    HPI Joel [name] is a 39 y.o. male with a history of anxiety, CHF, diabetes, hyperlipidemia, hypertension, obesity, renal disorder, CVA who presents to the ED for right ear fullness and dizziness for about a week and a half.  Patient states he had an appointment with his cardiologist who placed him on antibiotic but he states he has taken the antibiotic for 48 hours and it has not helped.  He is having severe pressure in his right ear.  Past Medical History:  Diagnosis Date  . Anxiety   . Chronic diastolic CHF (congestive heart failure) ([place])   . Diabetes mellitus without complication ([place])   . Hyperlipidemia   . Hypertension   . Hypertensive heart disease   . Noncompliance with medications   . Obesity   . Renal disorder   . Stroke ([place])   . Systolic dysfunction    a. TTE 06/2014: EF 45-50%, normal wall motion, mild MR, mild AI    Patient Active Problem List   Diagnosis Date Noted  . Type 1 diabetes mellitus with stage 3 chronic kidney disease ([place]) 05/21/2018  . Depression, major, recurrent, moderate ([place]) 12/19/2017  . Fatigue 04/07/2017  . Dilated cardiomyopathy ([place]) 02/23/2017  . Hypertensive heart disease with heart failure ([place]) 02/23/2017  . Essential hypertension 02/21/2017  . Uncontrolled type 2 diabetes mellitus with nephropathy ([place]) 02/21/2017  . H/O medication noncompliance 02/12/2017  . Chronic kidney disease (CKD), stage III (moderate) 02/12/2017  . Systolic CHF, chronic ([place]) 10/11/2014    Past Surgical History:  Procedure Laterality Date  . NO PAST SURGERIES      Allergies Bidil [isosorb dinitrate-hydralazine] and  Nitroglycerin  Social History Social History   Tobacco Use  . Smoking status: Never Smoker  . Smokeless tobacco: Never Used  Substance Use Topics  . Alcohol use: Not Currently    Comment: Very infrequently; ~2-3 drinks/year   . Drug use: No    Review of Systems Constitutional: Negative for fever. Cardiovascular: Negative for chest pain. Respiratory: Negative for shortness of breath. Gastrointestinal: Negative for abdominal pain, vomiting and diarrhea. Musculoskeletal: Negative for back pain. Skin: Negative for rash. Neurological: Negative for headaches, focal weakness or numbness.  Positive for vertigo  All systems negative/normal/unremarkable except as stated in the HPI  ____________________________________________   PHYSICAL EXAM:  VITAL SIGNS: ED Triage Vitals  Enc Vitals Group     BP 02/23/19 1021 124/69     Pulse Rate 02/23/19 1021 96     Resp 02/23/19 1021 20     Temp 02/23/19 1021 98.5 F (36.9 C)     Temp Source 02/23/19 1021 Oral     SpO2 02/23/19 1021 97 %     Weight 02/23/19 1019 295 lb (133.8 kg)     Height 02/23/19 1019 5\' 7"  (1.702 m)     Head Circumference --      Peak Flow --      Pain Score 02/23/19 1159 0     Pain Loc --      Pain Edu? --      Excl. in [place]? --     Constitutional: Alert and oriented. Well appearing and in no distress. Eyes: Conjunctivae are normal. Normal extraocular movements. ENT  Head: Normocephalic and atraumatic.  TMs are clear, there is cerumen bilaterally      Nose: No congestion/rhinnorhea.      Mouth/Throat: Mucous membranes are moist.      Neck: No stridor. Cardiovascular: Normal rate, regular rhythm. No murmurs, rubs, or gallops. Respiratory: Normal respiratory effort without tachypnea nor retractions. Breath sounds are clear and equal bilaterally. No wheezes/rales/rhonchi. Gastrointestinal: Soft and nontender. Normal bowel sounds Musculoskeletal: Nontender with normal range of motion in extremities. No lower  extremity tenderness nor edema. Neurologic:  Normal speech and language. No gross focal neurologic deficits are appreciated.  Skin:  Skin is warm, dry and intact. No rash noted. Psychiatric: Mood and affect are normal. Speech and behavior are normal.  ____________________________________________  EKG: Interpreted by me.  Normal sinus rhythm with a rate of 93 bpm, low voltage, normal axis, possible septal infarct age-indeterminate  ____________________________________________  ED COURSE:  As part of my medical decision making, I reviewed the following data within the electronic MEDICAL RECORD NUMBER History obtained from family if available, nursing notes, old chart and ekg, as well as notes from prior ED visits. Patient presented for vertigo, we will assess with labs and imaging as indicated at this time.   Procedures  Joel [name] was evaluated in Emergency Department on 02/23/2019 for the symptoms described in the history of present illness. He was evaluated in the context of the global COVID-19 pandemic, which necessitated consideration that the patient might be at risk for infection with the SARS-CoV-2 virus that causes COVID-19. Institutional protocols and algorithms that pertain to the evaluation of patients at risk for COVID-19 are in a state of rapid change based on information released by regulatory bodies including the CDC and federal and state organizations. These policies and algorithms were followed during the patient's care in the ED.  ____________________________________________   LABS (pertinent positives/negatives)  Labs Reviewed  BASIC METABOLIC PANEL - Abnormal; Notable for the following components:      Result Value   Glucose, Bld 120 (*)    Creatinine, Ser 2.04 (*)    GFR calc non Af Amer 40 (*)    GFR calc Af Amer 47 (*)    All other components within normal limits  CBC    RADIOLOGY Images were viewed by me  CT head  IMPRESSION:  Areas of paranasal sinus  disease. Study otherwise unremarkable.  ____________________________________________   DIFFERENTIAL DIAGNOSIS   Hyperglycemia, renal failure, dehydration, electrolyte abnormality, labyrinthitis  FINAL ASSESSMENT AND PLAN  Vertigo   Plan: The patient had presented for episodic vertigo. Patient's labs were at his baseline, he has chronic kidney disease that is stable. Patient's imaging revealed some sinus disease but no other acute process.  He will be discharged with meclizine, Valium and prednisone.  He is cleared for outpatient follow-up.   Ulice Dash, MD    Note: This note was generated in part or whole with voice recognition software. Voice recognition is usually quite accurate but there are transcription errors that can and very often do occur. I apologize for any typographical errors that were not detected and corrected.     Emily Filbert, MD 02/23/19 1300

## 2019-02-23 NOTE — ED Notes (Signed)
Transferred from wc to stretcher with 1 assist. Denies pain. No ear canal obstruction

## 2019-02-23 NOTE — ED Triage Notes (Signed)
Pt reports pressure to his right ear and some dizziness for about a week and a half. Pt states he had an online appointment with his MD and he was given an antibiotic to take but it has not helped. Pt reports dizziness is worse and he has severe pressure in his right ear. Pt reports difficult to stand straight.

## 2019-03-01 ENCOUNTER — Encounter: Payer: Medicaid Other | Admitting: *Deleted

## 2019-03-01 ENCOUNTER — Other Ambulatory Visit: Payer: Self-pay

## 2019-03-01 DIAGNOSIS — I5022 Chronic systolic (congestive) heart failure: Secondary | ICD-10-CM

## 2019-03-01 NOTE — Progress Notes (Signed)
Virtual F/U call done today. Patient had no new symptoms or concerns. He reports exercising most day of the week at a community gym, doing cardio and strength exercises. Continues to work on weight loss goal.

## 2019-03-02 ENCOUNTER — Telehealth: Payer: Self-pay

## 2019-03-02 NOTE — Telephone Encounter (Signed)
Confirmed patient cpap appointment with spouse for 03/03/19.

## 2019-03-02 NOTE — Addendum Note (Signed)
Addended by: [name] on: 03/02/2019 09:50 AM   Modules accepted: Orders

## 2019-03-03 ENCOUNTER — Encounter: Payer: Self-pay | Admitting: Emergency Medicine

## 2019-03-03 ENCOUNTER — Ambulatory Visit (INDEPENDENT_AMBULATORY_CARE_PROVIDER_SITE_OTHER): Payer: Medicaid Other

## 2019-03-03 ENCOUNTER — Encounter: Payer: Self-pay | Admitting: Family Medicine

## 2019-03-03 ENCOUNTER — Other Ambulatory Visit: Payer: Self-pay

## 2019-03-03 ENCOUNTER — Ambulatory Visit (INDEPENDENT_AMBULATORY_CARE_PROVIDER_SITE_OTHER): Payer: Medicaid Other | Admitting: Family Medicine

## 2019-03-03 DIAGNOSIS — G4733 Obstructive sleep apnea (adult) (pediatric): Secondary | ICD-10-CM

## 2019-03-03 DIAGNOSIS — R42 Dizziness and giddiness: Secondary | ICD-10-CM | POA: Diagnosis not present

## 2019-03-03 DIAGNOSIS — H9121 Sudden idiopathic hearing loss, right ear: Secondary | ICD-10-CM | POA: Diagnosis not present

## 2019-03-03 DIAGNOSIS — H9201 Otalgia, right ear: Secondary | ICD-10-CM | POA: Insufficient documentation

## 2019-03-03 DIAGNOSIS — Z5321 Procedure and treatment not carried out due to patient leaving prior to being seen by health care provider: Secondary | ICD-10-CM | POA: Insufficient documentation

## 2019-03-03 NOTE — Progress Notes (Signed)
Virtual Visit via Telephone The purpose of this virtual visit is to provide medical care while limiting exposure to the novel coronavirus (COVID19) for both patient and office staff.  Consent was obtained for phone visit:  Yes.   Answered questions that patient had about telehealth interaction:  Yes.   I discussed the limitations, risks, security and privacy concerns of performing an evaluation and management service by telephone. I also discussed with the patient that there may be a patient responsible charge related to this service. The patient expressed understanding and agreed to proceed.  Patient Location: Home Provider Location: [facility])  ---------------------------------------------------------------------- Chief Complaint  Patient presents with  . Hearing Loss    right side onset week which was intermitten ringing in the ears but now it is constant and has pressure with it which makes him nauseated and disoriented     S: Reviewed CMA documentation. I have called patient and gathered additional HPI as follows:  RIGHT EAR HEARING LOSS / DIZZINESS VERTIGO Reports that symptoms started back on 02/19/19 with acute R ear fullness pressure and dizziness/vertigo, he was treated for AOM with antibiotic, flonase and home Epley maneuver recommendation. - Interval update, he had worsening symptoms overall and went to [facility] ED on 02/23/19, they treated him with diazepam, meclizine, prednisone and discharge to follow up with ENT - Patient has contacted [place] for ENT Referral already, it was ordered yesterday 03/01/19 and was in process still - Today he reports now more sudden hearing loss in past few days on R side, and associated dizziness vertigo, nausea and ear fullness, he said that the ED did not see any damage to his ear, but hearing loss is dramatically worse now.  Denies any known or suspected exposure to person with or possibly with COVID19.  Admits persistent  dizziness, worse hearing loss R side Denies any fevers, chills, sweats, body ache, cough, shortness of breath, abdominal pain, diarrhea  Past Medical History:  Diagnosis Date  . Anxiety   . Chronic diastolic CHF (congestive heart failure) (HCC)   . Diabetes mellitus without complication (HCC)   . Hyperlipidemia   . Hypertension   . Hypertensive heart disease   . Noncompliance with medications   . Obesity   . Renal disorder   . Stroke (HCC)   . Systolic dysfunction    a. TTE 06/2014: EF 45-50%, normal wall motion, mild MR, mild AI   Social History   Tobacco Use  . Smoking status: Never Smoker  . Smokeless tobacco: Never Used  Substance Use Topics  . Alcohol use: Not Currently    Comment: Very infrequently; ~2-3 drinks/year   . Drug use: No    Current Outpatient Medications:  .  amLODipine (NORVASC) 5 MG tablet, Take 1 tablet (5 mg total) by mouth daily., Disp: 90 tablet, Rfl: 3 .  aspirin 81 MG chewable tablet, Chew 1 tablet (81 mg total) by mouth daily., Disp: , Rfl:  .  atorvastatin (LIPITOR) 80 MG tablet, Take 1 tablet (80 mg total) by mouth daily at 6 PM., Disp: 90 tablet, Rfl: 3 .  carvedilol (COREG) 25 MG tablet, Take 1 tablet (25 mg total) by mouth 2 (two) times daily with a meal., Disp: 180 tablet, Rfl: 3 .  empagliflozin (JARDIANCE) 25 MG TABS tablet, Take by mouth., Disp: , Rfl:  .  esomeprazole (NEXIUM) 20 MG capsule, Take 20 mg by mouth daily at 12 noon., Disp: , Rfl:  .  fluticasone (FLONASE) 50 MCG/ACT nasal  spray, Place 2 sprays into both nostrils daily. Use for 4-6 weeks then stop and use seasonally or as needed., Disp: 16 g, Rfl: 3 .  liraglutide (VICTOZA) 18 MG/3ML SOPN, Inject into the skin., Disp: , Rfl:  .  meclizine (ANTIVERT) 25 MG tablet, Take 1 tablet (25 mg total) by mouth 3 (three) times daily as needed for dizziness or nausea., Disp: 30 tablet, Rfl: 1 .  sacubitril-valsartan (ENTRESTO) 97-103 MG, Take 1 tablet by mouth 2 (two) times daily., Disp: 180  tablet, Rfl: 3 .  spironolactone (ALDACTONE) 25 MG tablet, Take 25 mg by mouth daily., Disp: , Rfl:  .  amoxicillin (AMOXIL) 500 MG capsule, Take 1 capsule (500 mg total) by mouth 2 (two) times daily. For 10 days (Patient not taking: Reported on 03/03/2019), Disp: 20 capsule, Rfl: 0 .  diazepam (VALIUM) 5 MG tablet, Take 1 tablet (5 mg total) by mouth every 8 (eight) hours as needed (To be taking with meclizine for vertigo). (Patient not taking: Reported on 03/03/2019), Disp: 20 tablet, Rfl: 0 .  HUMALOG 100 UNIT/ML injection, 10 units at breakfast 10 units at lunch 10 units at dinner, Disp: , Rfl:  .  insulin aspart (NOVOLOG) 100 UNIT/ML injection, Inject 5 Units into the skin 3 (three) times daily with meals. (Patient not taking: Reported on 03/03/2019), Disp: 10 mL, Rfl: 11 .  insulin detemir (LEVEMIR) 100 UNIT/ML injection, Inject 0.45 mLs (45 Units total) into the skin 2 (two) times daily. (Patient not taking: Reported on 03/03/2019), Disp: 20 mL, Rfl: 2 .  predniSONE (DELTASONE) 50 MG tablet, Take 1 tablet by mouth daily (Patient not taking: Reported on 03/03/2019), Disp: 4 tablet, Rfl: 0  Depression screen [facility] 2/9 03/03/2019 02/22/2019 11/30/2018  Decreased Interest 1 1 2   Down, Depressed, Hopeless 2 2 3   PHQ - 2 Score 3 3 5   Altered sleeping 1 1 1   Tired, decreased energy 3 1 3   Change in appetite 3 1 3   Feeling bad or failure about yourself  3 2 3   Trouble concentrating 2 1 3   Moving slowly or fidgety/restless 1 1 1   Suicidal thoughts 2 1 3   PHQ-9 Score 18 11 22   Difficult doing work/chores Somewhat difficult Somewhat difficult Extremely dIfficult   [place]-Suicide Severity Rating Scale 1) Have you wished you were dead or wished you could go to sleep and not wake up? - Yes  2) Have you had any actual thoughts of killing yourself? - No  Skip questions 3,4, 5  6) Have you ever done anything, started to do anything, or prepared to do anything to end your life? - No   GAD 7 :  Generalized Anxiety Score 03/03/2019 12/28/2018 03/22/2018 12/15/2017  Nervous, Anxious, on Edge 2 0 3 0  Control/stop worrying 3 0 3 0  Worry too much - different things 3 - 3 0  Trouble relaxing 2 0 1 0  Restless 3 0 2 0  Easily annoyed or irritable 3 0 3 -  Afraid - awful might happen 3 0 3 0  Total GAD 7 Score 19 - 18 -  Anxiety Difficulty Somewhat difficult Not difficult at all Very difficult Not difficult at all    -------------------------------------------------------------------------- O: No physical exam performed due to remote telephone encounter.  Lab results reviewed.  I have personally reviewed the radiology report from CT Head on 02/23/19.  CT Head Wo ContrastPerformed 02/23/2019 Final result  Study Result CLINICAL DATA: Dizziness and ataxia  EXAM: CT HEAD WITHOUT CONTRAST  TECHNIQUE: Contiguous axial images were obtained from the base of the skull through the vertex without intravenous contrast.  COMPARISON: March 03, 2017  FINDINGS: Brain: The ventricles are normal in size and configuration. There is no intracranial mass, hemorrhage, extra-axial fluid collection, or midline shift. The brain parenchyma appears unremarkable. There is no demonstrable acute infarct.  Vascular: There is no hyperdense vessel. No vascular calcifications are evident.  Skull: The bony calvarium appears intact.  Sinuses/Orbits: There is mucosal thickening in each maxillary antrum. There is mucosal thickening and opacification in multiple ethmoid air cells as well as patchy opacity in each sphenoid sinus. Orbits appear symmetric bilaterally.  Other: Visualized mastoid air cells are clear.  IMPRESSION: Areas of paranasal sinus disease. Study otherwise unremarkable.   Electronically Signed By: Lowella Grip III M.D. On: 02/23/2019 12:53    Recent Results (from the past 2160 hour(s))  Glucose, capillary     Status: Abnormal   Collection Time: 12/03/18  5:03 PM  Result  Value Ref Range   Glucose-Capillary 124 (H) 70 - 99 mg/dL  Glucose, capillary     Status: Abnormal   Collection Time: 12/03/18  5:57 PM  Result Value Ref Range   Glucose-Capillary 119 (H) 70 - 99 mg/dL  Glucose, capillary     Status: Abnormal   Collection Time: 12/07/18  4:42 PM  Result Value Ref Range   Glucose-Capillary 115 (H) 70 - 99 mg/dL  Glucose, capillary     Status: None   Collection Time: 12/07/18  5:40 PM  Result Value Ref Range   Glucose-Capillary 87 70 - 99 mg/dL  Glucose, capillary     Status: Abnormal   Collection Time: 01/18/19  4:57 PM  Result Value Ref Range   Glucose-Capillary 43 (LL) 70 - 99 mg/dL   Comment 1 Notify RN   Glucose, capillary     Status: Abnormal   Collection Time: 01/18/19  5:14 PM  Result Value Ref Range   Glucose-Capillary 56 (L) 70 - 99 mg/dL  Glucose, capillary     Status: Abnormal   Collection Time: 01/18/19  5:31 PM  Result Value Ref Range   Glucose-Capillary 60 (L) 70 - 99 mg/dL  Glucose, capillary     Status: Abnormal   Collection Time: 01/18/19  5:47 PM  Result Value Ref Range   Glucose-Capillary 66 (L) 70 - 99 mg/dL  CBC     Status: None   Collection Time: 02/23/19 10:26 AM  Result Value Ref Range   WBC 5.4 4.0 - 10.5 K/uL   RBC 4.94 4.22 - 5.81 MIL/uL   Hemoglobin 13.2 13.0 - 17.0 g/dL   HCT 40.7 39.0 - 52.0 %   MCV 82.4 80.0 - 100.0 fL   MCH 26.7 26.0 - 34.0 pg   MCHC 32.4 30.0 - 36.0 g/dL   RDW 15.4 11.5 - 15.5 %   Platelets 271 150 - 400 K/uL   nRBC 0.0 0.0 - 0.2 %    Comment: Performed at [facility], [address] [place], [place] [address]  Basic metabolic panel     Status: Abnormal   Collection Time: 02/23/19 10:26 AM  Result Value Ref Range   Sodium 139 135 - 145 mmol/L   Potassium 3.7 3.5 - 5.1 mmol/L   Chloride 106 98 - 111 mmol/L   CO2 23 22 - 32 mmol/L   Glucose, Bld 120 (H) 70 - 99 mg/dL   BUN 18 6 - 20 mg/dL   Creatinine, Ser 2.04 (H) 0.61 -  1.24 mg/dL   Calcium 9.1 8.9 - 56.2 mg/dL   GFR  calc non Af Amer 40 (L) >60 mL/min   GFR calc Af Amer 47 (L) >60 mL/min   Anion gap 10 5 - 15    Comment: Performed at [facility], [address], [place], [place] [address]    -------------------------------------------------------------------------- A&P:  Problem List Items Addressed This Visit    None    Visit Diagnoses    Sudden hearing loss, right    -  Primary   Relevant Orders   Ambulatory referral to ENT   Vertigo       Relevant Orders   Ambulatory referral to ENT     Acute or sudden worsening R hearing loss now in past few days following overall worsening R sided vertigo, ear problem with previous possible AOM vs Effusion, limited relief thus far on antibiotic, steroids, nasal steroid, vertigo treatment and Home Epley maneuver exercise regimen  -ED visit 02/23/19 CT Sinus, results above  Plan - Urgent referral updated today 03/03/19 given hearing loss now, and persistent vertigo - [facility] [place] and they will be on look out for referral today to get him worked in asap - Follow-up w/ referral team to send Urgent referral today   Orders Placed This Encounter  Procedures  . Ambulatory referral to ENT    Referral Priority:   Urgent    Referral Type:   Consultation    Referral Reason:   Specialty Services Required    Requested Specialty:   Otolaryngology    Number of Visits Requested:   1     No orders of the defined types were placed in this encounter.   Follow-up: As needed  Patient verbalizes understanding with the above medical recommendations including the limitation of remote medical advice.  Specific follow-up and call-back criteria were given for patient to follow-up or seek medical care more urgently if needed.   - Time spent in direct consultation with patient on phone: 7 minutes   Saralyn Pilar, DO [facility] 03/03/2019, 8:27 AM

## 2019-03-03 NOTE — Progress Notes (Signed)
95 percentile pressure   10.9   95th percentile leak   33   apnea index 0.5 /hr                    apnea-hypopnea index  0.8 /hr    total days used  >4 hr 29 days  total days used <4 hr 1 days   Total compliance 100 percent

## 2019-03-03 NOTE — Patient Instructions (Addendum)
We have called [facility] already, and upgraded your referral to more Urgent status and they should contact you today. Stay tuned.  Please schedule a Follow-up Appointment to: Return if symptoms worsen or fail to improve, for hearing loss.  If you have any other questions or concerns, please feel free to call the office or send a message through MyChart. You may also schedule an earlier appointment if necessary.  Additionally, you may be receiving a survey about your experience at our office within a few days to 1 week by e-mail or mail. We value your feedback.  Saralyn Pilar, DO [facility], [place]

## 2019-03-03 NOTE — ED Triage Notes (Addendum)
Pt to triage via w/c with no distress noted, mask in place; pt reports persistent rt ear pain x 2wks; seen here for same and rx med without relief; pt is awaiting his ENT referral appt

## 2019-03-04 ENCOUNTER — Ambulatory Visit (INDEPENDENT_AMBULATORY_CARE_PROVIDER_SITE_OTHER): Payer: Medicaid Other | Admitting: Family Medicine

## 2019-03-04 ENCOUNTER — Emergency Department
Admission: EM | Admit: 2019-03-04 | Discharge: 2019-03-04 | Disposition: A | Discharge status: Home / Self Care | Payer: Medicaid Other | Attending: Emergency Medicine | Admitting: Emergency Medicine

## 2019-03-04 ENCOUNTER — Encounter: Payer: Self-pay | Admitting: Family Medicine

## 2019-03-04 VITALS — BP 104/69 | HR 91 | Temp 97.3°F | Resp 16 | Ht 67.0 in | Wt 297.0 lb

## 2019-03-04 DIAGNOSIS — R112 Nausea with vomiting, unspecified: Secondary | ICD-10-CM

## 2019-03-04 DIAGNOSIS — H65191 Other acute nonsuppurative otitis media, right ear: Secondary | ICD-10-CM

## 2019-03-04 DIAGNOSIS — H8301 Labyrinthitis, right ear: Secondary | ICD-10-CM

## 2019-03-04 DIAGNOSIS — R42 Dizziness and giddiness: Secondary | ICD-10-CM

## 2019-03-04 MED ORDER — OFLOXACIN 0.3 % OT SOLN: 5.0000 [drp] | Freq: Two times a day (BID) | OTIC | 0 refills | Status: DC

## 2019-03-04 MED ORDER — PREDNISONE 20 MG PO TABS: ORAL_TABLET | ORAL | 0 refills | Status: DC

## 2019-03-04 MED ORDER — FUROSEMIDE 20 MG PO TABS: 20.0000 mg | ORAL_TABLET | Freq: Every day | ORAL | 0 refills | Status: DC

## 2019-03-04 MED ORDER — ONDANSETRON 4 MG PO TBDP: 4.0000 mg | ORAL_TABLET | Freq: Three times a day (TID) | ORAL | 1 refills | Status: DC | PRN

## 2019-03-04 NOTE — ED Notes (Signed)
No answer when called several times from lobby; STAT registration spoke with pt's wife via phone who st that they had already left before being seen

## 2019-03-04 NOTE — Progress Notes (Signed)
Subjective:    Patient ID: Joel [name], male    DOB: [birth year], 39 y.o.   MRN: [id number]  Joel [name] is a 39 y.o. male presenting on 03/04/2019 for Dizziness (Right side hearing loss as per patient )   HPI   VERTIGO / DIZZINESS / NAUSEA VOMITING / Right Ear Pressure / LOSS OF HEARING  Recent course: - Symptoms started back on 02/19/19 with acute R ear fullness pressure and dizziness/vertigo, he was treated for AOM with antibiotic amoxicillin, flonase and home Epley maneuver recommendation. - He had worsening symptoms overall and went to [facility] ED on 02/23/19, they treated him with diazepam, meclizine, prednisone and discharge to follow up with ENT - He was unable to be referred to ENT by our office due to discrepancy of provider name on his insurance medicaid card, unable to be seen by [facility] despite our urgent referral on 03/03/19. - Today he presents in follow-up due to limited options, he attempted to go back to ED but was not seen, left before triage - He complains of worsening severe episodic vertigo dizziness nausea with sudden movements and head movements, affecting his balance and walking, cannot drive car, he has had some nausea assoc, and vomiting difficulty maintaining nutrition, some weight loss in few days, also R ear is very full feeling and has loss of hearing can hear a machine like tinnitus noise - He says the medicines from ED Prednisone x 5 day, Meclizine, Diazepam did not help. - The original antibiotic did not resolve the problem. - He has not had similar issue before. - History of some headaches  Denies fever, chills, cough, sinus congestion or drainage, wheezing, near syncope, numbness weakness tremors, facial paralysis, rash, ear drainage or bleeding, trauma or injury concussion  Depression screen [facility] 2/9 03/03/2019 02/22/2019 11/30/2018  Decreased Interest 1 1 2   Down, Depressed, Hopeless 2 2 3   PHQ - 2 Score 3 3 5   Altered sleeping 1 1 1   Tired, decreased  energy 3 1 3   Change in appetite 3 1 3   Feeling bad or failure about yourself  3 2 3   Trouble concentrating 2 1 3   Moving slowly or fidgety/restless 1 1 1   Suicidal thoughts 2 1 3   PHQ-9 Score 18 11 22   Difficult doing work/chores Somewhat difficult Somewhat difficult Extremely dIfficult    Social History   Tobacco Use  . Smoking status: Never Smoker  . Smokeless tobacco: Never Used  Substance Use Topics  . Alcohol use: Not Currently    Comment: Very infrequently; ~2-3 drinks/year   . Drug use: No    Review of Systems Per HPI unless specifically indicated above     Objective:    BP 104/69   Pulse 91   Temp (!) 97.3 F (36.3 C) (Oral)   Resp 16   Ht 5\' 7"  (1.702 m)   Wt 297 lb (134.7 kg)   BMI 46.52 kg/m   Wt Readings from Last 3 Encounters:  03/04/19 297 lb (134.7 kg)  03/03/19 295 lb (133.8 kg)  02/23/19 295 lb (133.8 kg)    Physical Exam Vitals and nursing note reviewed.  Constitutional:      General: He is not in acute distress.    Appearance: He is well-developed. He is not diaphoretic.     Comments: Well-appearing, comfortable, cooperative  HENT:     Head: Normocephalic and atraumatic.     Right Ear: Ear canal and external ear normal.  Left Ear: Tympanic membrane, ear canal and external ear normal.     Ears:     Comments: R TM has abnormal appearance. Some surrounding inflammation is apparent but no evidence of pus but TM does seem to have some effusion, loss of normal landmarks, no obvious perforation. No cerumen impaction. Eyes:     General:        Right eye: No discharge.        Left eye: No discharge.     Conjunctiva/sclera: Conjunctivae normal.  Cardiovascular:     Rate and Rhythm: Normal rate.  Pulmonary:     Effort: Pulmonary effort is normal.  Skin:    General: Skin is warm and dry.     Findings: No erythema or rash.  Neurological:     Mental Status: He is alert and oriented to person, place, and time.  Psychiatric:        Behavior:  Behavior normal.     Comments: Well groomed, good eye contact, normal speech and thoughts    I have personally reviewed the radiology report from 02/23/19 Head CT.   CLINICAL DATA:  Dizziness and ataxia  EXAM: CT HEAD WITHOUT CONTRAST  TECHNIQUE: Contiguous axial images were obtained from the base of the skull through the vertex without intravenous contrast.  COMPARISON:  March 03, 2017  FINDINGS: Brain: The ventricles are normal in size and configuration. There is no intracranial mass, hemorrhage, extra-axial fluid collection, or midline shift. The brain parenchyma appears unremarkable. There is no demonstrable acute infarct.  Vascular: There is no hyperdense vessel. No vascular calcifications are evident.  Skull: The bony calvarium appears intact.  Sinuses/Orbits: There is mucosal thickening in each maxillary antrum. There is mucosal thickening and opacification in multiple ethmoid air cells as well as patchy opacity in each sphenoid sinus. Orbits appear symmetric bilaterally.  Other: Visualized mastoid air cells are clear.  IMPRESSION: Areas of paranasal sinus disease.  Study otherwise unremarkable.   Electronically Signed   By: Bretta Bang III M.D.   On: 02/23/2019 12:53   Results for orders placed or performed during the hospital encounter of 02/23/19  CBC  Result Value Ref Range   WBC 5.4 4.0 - 10.5 K/uL   RBC 4.94 4.22 - 5.81 MIL/uL   Hemoglobin 13.2 13.0 - 17.0 g/dL   HCT 42.3 53.6 - 14.4 %   MCV 82.4 80.0 - 100.0 fL   MCH 26.7 26.0 - 34.0 pg   MCHC 32.4 30.0 - 36.0 g/dL   RDW 31.5 40.0 - 86.7 %   Platelets 271 150 - 400 K/uL   nRBC 0.0 0.0 - 0.2 %  Basic metabolic panel  Result Value Ref Range   Sodium 139 135 - 145 mmol/L   Potassium 3.7 3.5 - 5.1 mmol/L   Chloride 106 98 - 111 mmol/L   CO2 23 22 - 32 mmol/L   Glucose, Bld 120 (H) 70 - 99 mg/dL   BUN 18 6 - 20 mg/dL   Creatinine, Ser 6.19 (H) 0.61 - 1.24 mg/dL   Calcium 9.1  8.9 - 50.9 mg/dL   GFR calc non Af Amer 40 (L) >60 mL/min   GFR calc Af Amer 47 (L) >60 mL/min   Anion gap 10 5 - 15      Assessment & Plan:   Problem List Items Addressed This Visit    None    Visit Diagnoses    Labyrinthitis of right ear    -  Primary   Relevant  Medications   furosemide (LASIX) 20 MG tablet   predniSONE (DELTASONE) 20 MG tablet   Vertigo       Non-intractable vomiting with nausea, unspecified vomiting type       Relevant Medications   ondansetron (ZOFRAN ODT) 4 MG disintegrating tablet   Other non-recurrent acute nonsuppurative otitis media of right ear       Relevant Medications   ofloxacin (FLOXIN) 0.3 % OTIC solution      Complicated recent course of acute onset and progressive worse vertigo / R ear hearing loss vs tinnitus / nausea vomiting / dysequilibrium - Uncertain clear etiology, has failed most therapy options thus far and multiple visit and ED visit - Unfortunately due to [facility] insurance unable to refer him urgently to ENT specialist because provider name was not updated on his card. This is now in process and referral will be submitted once able   -Diff dx: no focal neuro deficit, less likely CVA or ischemic cause, NEGATIVE Head CT for this, no other obvious infectious symptoms or fever and no evidence on exam to support primary issue is AOM or infection limited improvement on amoxicillin, no regular migraine history - less likely vestibular migraines  Suspected Labyrinthitis based on symptoms, however failed initial prednisone course, was short duration  Concern with hearing loss cannot rule out Meniere's disease, clinically seems to be possibility as well  Will provide empiric coverage for multiple conditions today as discussed with patient  For Labyrinthitis - Start Prednisone taper longer course 9 day taper, advised may be self limited  For possible Meniere's - start Furosemide 20mg  daily x 5-10 days, can shorten course if indicated or  improving  For potential still present AOM - will offer Ofloxacin otic drops  Additional therapies for symptoms - Zofran ODT PRN nausea, vomiting wt loss - Home epley maneuver for vertigo, already has diazepam/meclizine - but limited relief  Strict return criteria - next step if not improving, needs to seek care at hospital ED again and be evaluated, may warrant further eval vs ENT consultation in ED    Meds ordered this encounter  Medications  . DISCONTD: ondansetron (ZOFRAN ODT) 4 MG disintegrating tablet    Sig: Take 1-2 tablets (4-8 mg total) by mouth every 8 (eight) hours as needed for nausea or vomiting.    Dispense:  30 tablet    Refill:  1  . furosemide (LASIX) 20 MG tablet    Sig: Take 1 tablet (20 mg total) by mouth daily. For 5-10 days then as needed    Dispense:  20 tablet    Refill:  0  . predniSONE (DELTASONE) 20 MG tablet    Sig: Take 3 tablets daily (60mg ) for 3 days, take 2 tab daily (20mg ) for 4 days, take 1 tab daily (20mg ) for 2 days    Dispense:  16 tablet    Refill:  0  . ofloxacin (FLOXIN) 0.3 % OTIC solution    Sig: Place 5 drops into the right ear 2 (two) times daily. For 7 days    Dispense:  5 mL    Refill:  0  . ondansetron (ZOFRAN ODT) 4 MG disintegrating tablet    Sig: Take 1-2 tablets (4-8 mg total) by mouth every 8 (eight) hours as needed for nausea or vomiting.    Dispense:  30 tablet    Refill:  1      Follow up plan: Return in about 1 week (around 03/11/2019), or if symptoms worsen or fail  to improve, for R ear vertigo as needed.   Saralyn Pilar, DO [facility] 03/04/2019, 2:04 PM

## 2019-03-04 NOTE — Patient Instructions (Addendum)
Thank you for coming to the office today.  Labyrinthitis or vestibular neuritis seems to be most likely cause, look into this. The only treatment I am aware of would be the prednisone, now longer course.  Zofran as needed for nausea, dissolving pill take it regularly if you choose to help slow down nausea  Antibiotic ear drops, as prescribed for up to 1 week. If we need to switch to other antibiotic oral we can do that, just let us know.  Furosemide 20mg  once daily for 5 to 10 days, then as needed for fluid can help reduce fluid if this is a meniere's  If worsening loss of hearing, severe pain, fever, new symptoms or neurological concerns  Then seek care sooner if needed.  We will need to get you into ENT in near future if possible for hearing loss.  Please schedule a Follow-up Appointment to: Return in about 1 week (around 03/11/2019), or if symptoms worsen or fail to improve, for R ear vertigo as needed.  If you have any other questions or concerns, please feel free to call the office or send a message through MyChart. You may also schedule an earlier appointment if necessary.  Additionally, you may be receiving a survey about your experience at our office within a few days to 1 week by e-mail or mail. We value your feedback.  03/13/2019, DO [facility], [facility]

## 2019-03-05 ENCOUNTER — Telehealth: Payer: Self-pay

## 2019-03-05 NOTE — Telephone Encounter (Signed)
Confirmed virtual visit with patient. klh 

## 2019-03-08 ENCOUNTER — Encounter: Payer: Medicaid Other | Admitting: *Deleted

## 2019-03-08 ENCOUNTER — Other Ambulatory Visit: Payer: Self-pay

## 2019-03-08 DIAGNOSIS — I5022 Chronic systolic (congestive) heart failure: Secondary | ICD-10-CM

## 2019-03-08 NOTE — Progress Notes (Signed)
Daily Session Note  Patient Details  Name: Joel [name] MRN: [id number] Date of Birth: 1981/02/10 Referring Provider:     Cardiac Rehab from 11/30/2018 in [facility]  Referring Provider  [place]      Encounter Date: 03/08/2019  Check In: Session Check In - 03/08/19 1713      Check-In   Supervising physician immediately available to respond to emergencies  See telemetry face sheet for immediately available ER MD    Location  [facility]    Staff Present  Nada Maclachlan, BA, ACSM CEP, Exercise Physiologist;Icelyn Navarrete [name], RN Moises Blood, BS, ACSM CEP, Exercise Physiologist;Joseph Tessie Fass RCP,RRT,BSRT    Virtual Visit  No    Medication changes reported      Yes    Comments  added prednisone, ear drops and furosemide    Fall or balance concerns reported     Yes    Comments  No injuries. Balance issues, in communication with MD. Trying to figure out if it is Meniere's disease    Warm-up and Cool-down  Performed on first and last piece of equipment    Resistance Training Performed  Yes    VAD Patient?  No    PAD/SET Patient?  No      Pain Assessment   Currently in Pain?  No/denies          Social History   Tobacco Use  Smoking Status Never Smoker  Smokeless Tobacco Never Used    Goals Met:  Independence with exercise equipment Exercise tolerated well No report of cardiac concerns or symptoms Strength training completed today  Goals Unmet:  Not Applicable  Comments: Pt able to follow exercise prescription today without complaint.  Will continue to monitor for progression.    Dr. Emily Filbert is Medical Director for [place] and [facility].

## 2019-03-09 ENCOUNTER — Encounter: Payer: Self-pay | Admitting: Internal Medicine

## 2019-03-09 ENCOUNTER — Ambulatory Visit: Payer: Medicaid Other | Admitting: Internal Medicine

## 2019-03-09 VITALS — Ht 68.0 in | Wt 281.0 lb

## 2019-03-09 DIAGNOSIS — I10 Essential (primary) hypertension: Secondary | ICD-10-CM

## 2019-03-09 DIAGNOSIS — Z9989 Dependence on other enabling machines and devices: Secondary | ICD-10-CM

## 2019-03-09 DIAGNOSIS — G4733 Obstructive sleep apnea (adult) (pediatric): Secondary | ICD-10-CM

## 2019-03-09 NOTE — Progress Notes (Signed)
[facility] [place], [place] [address]  Internal MEDICINE  Telephone Visit  Patient Name: Joel [name]  [id number]  [id number]  Date of Service: 03/09/2019  I connected with the patient at 318 by telephone and verified the patients identity using two identifiers.   I discussed the limitations, risks, security and privacy concerns of performing an evaluation and management service by telephone and the availability of in person appointments. I also discussed with the patient that there may be a patient responsible charge related to the service.  The patient expressed understanding and agrees to proceed.    Chief Complaint  Patient presents with  . Telephone Assessment  . Telephone Screen  . Sleep Apnea    HPI  Pt is seen via video. He is seen for follow up on osa, A recent Echo, and cpap download.  His download shows 100% compliance.  His echo has some LVh as well as diastolic dysfunction.  We discussed these finding and the importance of keeping bp under control, and the importance of continued cpap compliance.     Current Medication: Outpatient Encounter Medications as of 03/09/2019  Medication Sig  . amLODipine (NORVASC) 5 MG tablet Take 1 tablet (5 mg total) by mouth daily.  Marland Kitchen aspirin 81 MG chewable tablet Chew 1 tablet (81 mg total) by mouth daily.  Marland Kitchen atorvastatin (LIPITOR) 80 MG tablet Take 1 tablet (80 mg total) by mouth daily at 6 PM.  . carvedilol (COREG) 25 MG tablet Take 1 tablet (25 mg total) by mouth 2 (two) times daily with a meal.  . diazepam (VALIUM) 5 MG tablet Take 1 tablet (5 mg total) by mouth every 8 (eight) hours as needed (To be taking with meclizine for vertigo).  Marland Kitchen empagliflozin (JARDIANCE) 25 MG TABS tablet Take by mouth.  . esomeprazole (NEXIUM) 20 MG capsule Take 20 mg by mouth daily at 12 noon.  . fluticasone (FLONASE) 50 MCG/ACT nasal spray Place 2 sprays into both nostrils daily. Use for 4-6 weeks then stop and use seasonally or  as needed.  . furosemide (LASIX) 20 MG tablet Take 1 tablet (20 mg total) by mouth daily. For 5-10 days then as needed  . HUMALOG 100 UNIT/ML injection 10 units at breakfast 10 units at lunch 10 units at dinner  . insulin aspart (NOVOLOG) 100 UNIT/ML injection Inject 5 Units into the skin 3 (three) times daily with meals.  . insulin detemir (LEVEMIR) 100 UNIT/ML injection Inject 0.45 mLs (45 Units total) into the skin 2 (two) times daily.  Marland Kitchen liraglutide (VICTOZA) 18 MG/3ML SOPN Inject into the skin.  Marland Kitchen meclizine (ANTIVERT) 25 MG tablet Take 1 tablet (25 mg total) by mouth 3 (three) times daily as needed for dizziness or nausea.  Marland Kitchen ofloxacin (FLOXIN) 0.3 % OTIC solution Place 5 drops into the right ear 2 (two) times daily. For 7 days  . ondansetron (ZOFRAN ODT) 4 MG disintegrating tablet Take 1-2 tablets (4-8 mg total) by mouth every 8 (eight) hours as needed for nausea or vomiting.  . predniSONE (DELTASONE) 20 MG tablet Take 3 tablets daily (60mg ) for 3 days, take 2 tab daily (20mg ) for 4 days, take 1 tab daily (20mg ) for 2 days  . sacubitril-valsartan (ENTRESTO) 97-103 MG Take 1 tablet by mouth 2 (two) times daily.  Marland Kitchen spironolactone (ALDACTONE) 25 MG tablet Take 25 mg by mouth daily.   No facility-administered encounter medications on file as of 03/09/2019.    Surgical History: Past Surgical History:  Procedure Laterality Date  . NO PAST SURGERIES      Medical History: Past Medical History:  Diagnosis Date  . Anxiety   . Chronic diastolic CHF (congestive heart failure) (HCC)   . Diabetes mellitus without complication (HCC)   . Hyperlipidemia   . Hypertension   . Hypertensive heart disease   . Noncompliance with medications   . Obesity   . Renal disorder   . Stroke (HCC)   . Systolic dysfunction    a. TTE 06/2014: EF 45-50%, normal wall motion, mild MR, mild AI    Family History: Family History  Problem Relation Age of Onset  . Hypertension Mother   . Hyperlipidemia Mother    . Diabetes Mother   . CAD Father        a. stenting in his early 19s  . Heart disease Father   . Stroke Father   . Diabetes Maternal Grandmother   . Diabetes Maternal Grandfather   . CAD Paternal Grandmother   . CAD Paternal Grandfather   . Colon cancer Paternal Grandfather   . Diabetes Paternal Uncle     Social History   Socioeconomic History  . Marital status: Married    Spouse name: Not on file  . Number of children: 1  . Years of education: 24  . Highest education level: 12th grade  Occupational History  . Not on file  Tobacco Use  . Smoking status: Never Smoker  . Smokeless tobacco: Never Used  Substance and Sexual Activity  . Alcohol use: Not Currently    Comment: Very infrequently; ~2-3 drinks/year   . Drug use: No  . Sexual activity: Yes    Birth control/protection: None  Other Topics Concern  . Not on file  Social History Narrative  . Not on file   Social Determinants of Health   Financial Resource Strain:   . Difficulty of Paying Living Expenses: Not on file  Food Insecurity:   . Worried About Programme researcher, broadcasting/film/video in the Last Year: Not on file  . Ran Out of Food in the Last Year: Not on file  Transportation Needs:   . Lack of Transportation (Medical): Not on file  . Lack of Transportation (Non-Medical): Not on file  Physical Activity:   . Days of Exercise per Week: Not on file  . Minutes of Exercise per Session: Not on file  Stress:   . Feeling of Stress : Not on file  Social Connections:   . Frequency of Communication with Friends and Family: Not on file  . Frequency of Social Gatherings with Friends and Family: Not on file  . Attends Religious Services: Not on file  . Active Member of Clubs or Organizations: Not on file  . Attends Banker Meetings: Not on file  . Marital Status: Not on file  Intimate Partner Violence:   . Fear of Current or Ex-Partner: Not on file  . Emotionally Abused: Not on file  . Physically Abused: Not on  file  . Sexually Abused: Not on file      Review of Systems  Constitutional: Negative.  Negative for chills, fatigue and unexpected weight change.  HENT: Negative.  Negative for congestion, rhinorrhea, sneezing and sore throat.   Eyes: Negative for redness.  Respiratory: Negative.  Negative for cough, chest tightness and shortness of breath.   Cardiovascular: Negative.  Negative for chest pain and palpitations.  Gastrointestinal: Negative.  Negative for abdominal pain, constipation, diarrhea, nausea and vomiting.  Endocrine: Negative.  Genitourinary: Negative.  Negative for dysuria and frequency.  Musculoskeletal: Negative.  Negative for arthralgias, back pain, joint swelling and neck pain.  Skin: Negative.  Negative for rash.  Allergic/Immunologic: Negative.   Neurological: Negative.  Negative for tremors and numbness.  Hematological: Negative for adenopathy. Does not bruise/bleed easily.  Psychiatric/Behavioral: Negative.  Negative for behavioral problems, sleep disturbance and suicidal ideas. The patient is not nervous/anxious.     Vital Signs: Ht 5\' 8"  (1.727 m)   Wt 281 lb (127.5 kg)   BMI 42.73 kg/m    Observation/Objective:  Pt is in bed on video. Ill appearing, NAD noted.   Assessment/Plan: 1. OSA on CPAP Continue to use cpap nightly as discussed.  Good compliance at this time.  Good symptom management.    2. Essential hypertension Stable, continue to monitor.  LVH present on echo.    3. Morbid obesity (HCC) Obesity Counseling: Risk Assessment: An assessment of behavioral risk factors was made today and includes lack of exercise sedentary lifestyle, lack of portion control and poor dietary habits.  Risk Modification Advice: She was counseled on portion control guidelines. Restricting daily caloric intake to 1800. The detrimental long term effects of obesity on her health and ongoing poor compliance was also discussed with the patient.    General Counseling:  gaylon bentz understanding of the findings of today's phone visit and agrees with plan of treatment. I have discussed any further diagnostic evaluation that may be needed or ordered today. We also reviewed his medications today. he has been encouraged to call the office with any questions or concerns that should arise related to todays visit.    No orders of the defined types were placed in this encounter.   No orders of the defined types were placed in this encounter.   Time spent: 20 Minutes    [facility]

## 2019-03-10 ENCOUNTER — Encounter: Payer: Self-pay | Admitting: *Deleted

## 2019-03-10 DIAGNOSIS — I5022 Chronic systolic (congestive) heart failure: Secondary | ICD-10-CM

## 2019-03-10 NOTE — Progress Notes (Signed)
Cardiac Individual Treatment Plan  Patient Details  Name: KALVEN GANIM MRN: [id number] Date of Birth: 26-Feb-1980 Referring Provider:     Cardiac Rehab from 11/30/2018 in [facility] Cardiac and Pulmonary Rehab  Referring Provider  Gollan      Initial Encounter Date:    Cardiac Rehab from 11/30/2018 in [facility] Cardiac and Pulmonary Rehab  Date  11/30/18      Visit Diagnosis: Heart failure, chronic systolic ([place])  Patient's Home Medications on Admission:  Current Outpatient Medications:  .  amLODipine (NORVASC) 5 MG tablet, Take 1 tablet (5 mg total) by mouth daily., Disp: 90 tablet, Rfl: 3 .  aspirin 81 MG chewable tablet, Chew 1 tablet (81 mg total) by mouth daily., Disp: , Rfl:  .  atorvastatin (LIPITOR) 80 MG tablet, Take 1 tablet (80 mg total) by mouth daily at 6 PM., Disp: 90 tablet, Rfl: 3 .  carvedilol (COREG) 25 MG tablet, Take 1 tablet (25 mg total) by mouth 2 (two) times daily with a meal., Disp: 180 tablet, Rfl: 3 .  diazepam (VALIUM) 5 MG tablet, Take 1 tablet (5 mg total) by mouth every 8 (eight) hours as needed (To be taking with meclizine for vertigo)., Disp: 20 tablet, Rfl: 0 .  empagliflozin (JARDIANCE) 25 MG TABS tablet, Take by mouth., Disp: , Rfl:  .  esomeprazole (NEXIUM) 20 MG capsule, Take 20 mg by mouth daily at 12 noon., Disp: , Rfl:  .  fluticasone (FLONASE) 50 MCG/ACT nasal spray, Place 2 sprays into both nostrils daily. Use for 4-6 weeks then stop and use seasonally or as needed., Disp: 16 g, Rfl: 3 .  furosemide (LASIX) 20 MG tablet, Take 1 tablet (20 mg total) by mouth daily. For 5-10 days then as needed, Disp: 20 tablet, Rfl: 0 .  HUMALOG 100 UNIT/ML injection, 10 units at breakfast 10 units at lunch 10 units at dinner, Disp: , Rfl:  .  insulin aspart (NOVOLOG) 100 UNIT/ML injection, Inject 5 Units into the skin 3 (three) times daily with meals., Disp: 10 mL, Rfl: 11 .  insulin detemir (LEVEMIR) 100 UNIT/ML injection, Inject 0.45 mLs (45 Units total) into the skin  2 (two) times daily., Disp: 20 mL, Rfl: 2 .  liraglutide (VICTOZA) 18 MG/3ML SOPN, Inject into the skin., Disp: , Rfl:  .  meclizine (ANTIVERT) 25 MG tablet, Take 1 tablet (25 mg total) by mouth 3 (three) times daily as needed for dizziness or nausea., Disp: 30 tablet, Rfl: 1 .  ofloxacin (FLOXIN) 0.3 % OTIC solution, Place 5 drops into the right ear 2 (two) times daily. For 7 days, Disp: 5 mL, Rfl: 0 .  ondansetron (ZOFRAN ODT) 4 MG disintegrating tablet, Take 1-2 tablets (4-8 mg total) by mouth every 8 (eight) hours as needed for nausea or vomiting., Disp: 30 tablet, Rfl: 1 .  predniSONE (DELTASONE) 20 MG tablet, Take 3 tablets daily (67m) for 3 days, take 2 tab daily (242m for 4 days, take 1 tab daily (2029mfor 2 days, Disp: 16 tablet, Rfl: 0 .  sacubitril-valsartan (ENTRESTO) 97-103 MG, Take 1 tablet by mouth 2 (two) times daily., Disp: 180 tablet, Rfl: 3 .  spironolactone (ALDACTONE) 25 MG tablet, Take 25 mg by mouth daily., Disp: , Rfl:   Past Medical History: Past Medical History:  Diagnosis Date  . Anxiety   . Chronic diastolic CHF (congestive heart failure) ([facility] . Diabetes mellitus without complication ([place] . Hyperlipidemia   . Hypertension   . Hypertensive  heart disease   . Noncompliance with medications   . Obesity   . Renal disorder   . Stroke ([place])   . Systolic dysfunction    a. TTE 06/2014: EF 45-50%, normal wall motion, mild MR, mild AI    Tobacco Use: Social History   Tobacco Use  Smoking Status Never Smoker  Smokeless Tobacco Never Used    Labs: Recent Review Flowsheet Data    Labs for ITP Cardiac and Pulmonary Rehab Latest Ref Rng & Units 06/11/2014 10/11/2014 02/12/2017 02/13/2017 12/15/2017   Cholestrol 0 - 200 mg/dL 361(H) - - 435(H) -   LDLCALC 0 - 99 mg/dL 271(H) - - 352(H) -   HDL >40 mg/dL 41 - - 30(L) -   Trlycerides <150 mg/dL 246(H) - - 267(H) -   Hemoglobin A1c 4.8 - 5.6 % - 7.3(H) 14.7(H) - 13.3(H)       Exercise Target Goals: Exercise  Program Goal: Individual exercise prescription set using results from initial 6 min walk test and THRR while considering  patient's activity barriers and safety.   Exercise Prescription Goal: Initial exercise prescription builds to 30-45 minutes a day of aerobic activity, 2-3 days per week.  Home exercise guidelines will be given to patient during program as part of exercise prescription that the participant will acknowledge.  Activity Barriers & Risk Stratification: Activity Barriers & Cardiac Risk Stratification - 11/13/18 1313      Activity Barriers & Cardiac Risk Stratification   Activity Barriers  None    Cardiac Risk Stratification  High       6 Minute Walk: 6 Minute Walk    Row Name 11/30/18 1431         6 Minute Walk   Phase  Initial     Distance  1120 feet     Walk Time  6 minutes     # of Rest Breaks  0     MPH  2.12     METS  3.1     RPE  9     Perceived Dyspnea   1     VO2 Peak  10.8     Symptoms  No     Resting HR  97 bpm     Resting BP  124/84     Resting Oxygen Saturation   97 %     Exercise Oxygen Saturation  during 6 min walk  99 %     Max Ex. HR  114 bpm     Max Ex. BP  126/88     2 Minute Post BP  122/84        Oxygen Initial Assessment:   Oxygen Re-Evaluation:   Oxygen Discharge (Final Oxygen Re-Evaluation):   Initial Exercise Prescription: Initial Exercise Prescription - 11/30/18 1400      Date of Initial Exercise RX and Referring Provider   Date  11/30/18    Referring Provider  Gollan      Treadmill   MPH  2.1    Grade  1.5    Minutes  15    METs  3      Recumbant Bike   Level  3    RPM  60    Watts  53    Minutes  15    METs  3      NuStep   Level  4    SPM  80    Minutes  15    METs  3  Recumbant Elliptical   Level  2    RPM  50    Minutes  15    METs  3      REL-XR   Level  3    Speed  50    Minutes  15    METs  3      T5 Nustep   Level  2    SPM  80    Minutes  15    METs  3      Prescription  Details   Frequency (times per week)  3      Intensity   THRR 40-80% of Max Heartrate  131-166    Ratings of Perceived Exertion  11-13    Perceived Dyspnea  0-4      Resistance Training   Training Prescription  Yes    Weight  3 lb    Reps  10-15       Perform Capillary Blood Glucose checks as needed.  Exercise Prescription Changes: Exercise Prescription Changes    Row Name 11/30/18 1400 12/10/18 1400 12/23/18 1200 01/05/19 1400 01/20/19 1500     Response to Exercise   Blood Pressure (Admit)  124/84  136/86  142/88  148/100  144/96   Blood Pressure (Exercise)  126/88  148/82  128/60  162/90  168/80   Blood Pressure (Exit)  122/84  132/82  122/90  134/92  134/92   Heart Rate (Admit)  97 bpm  82 bpm  89 bpm  60 bpm  100 bpm   Heart Rate (Exercise)  114 bpm  129 bpm  133 bpm  132 bpm  133 bpm   Heart Rate (Exit)  99 bpm  122 bpm  111 bpm  121 bpm  105 bpm   Oxygen Saturation (Admit)  97 %  --  --  --  --   Oxygen Saturation (Exercise)  99 %  --  --  --  --   Rating of Perceived Exertion (Exercise)  9  11  13  17  13    Perceived Dyspnea (Exercise)  1  --  --  --  --   Symptoms  none  none  none  none  none   Duration  --  Progress to 30 minutes of  aerobic without signs/symptoms of physical distress  Continue with 30 min of aerobic exercise without signs/symptoms of physical distress.  Continue with 30 min of aerobic exercise without signs/symptoms of physical distress.  Continue with 30 min of aerobic exercise without signs/symptoms of physical distress.   Intensity  --  THRR unchanged  THRR unchanged  THRR unchanged  THRR unchanged     Progression   Progression  --  Continue to progress workloads to maintain intensity without signs/symptoms of physical distress.  Continue to progress workloads to maintain intensity without signs/symptoms of physical distress.  Continue to progress workloads to maintain intensity without signs/symptoms of physical distress.  Continue to progress  workloads to maintain intensity without signs/symptoms of physical distress.   Average METs  --  2.6  3.35  4.27  4.61     Resistance Training   Training Prescription  --  Yes  Yes  Yes  Yes   Weight  --  3 lb  3 lb  4 lb  4 lb   Reps  --  10-15  10-15  10-15  10-15     Interval Training   Interval Training  --  --  No  No  No     Treadmill   MPH  --  2.1  2.1  2.1  2.8   Grade  --  1.5  1.5  1.5  1.5   Minutes  --  15  15  15  15    METs  --  3  3.3  3.3  3.75     Recumbant Bike   Level  --  --  3  --  --   Minutes  --  --  15  --  --   METs  --  --  3  --  --     NuStep   Level  --  2  --  --  4   SPM  --  80  --  --  --   Minutes  --  15  --  --  15   METs  --  2.2  --  --  5.4     Recumbant Elliptical   Level  --  --  --  --  3   Minutes  --  --  --  --  15   METs  --  --  --  --  3.2     REL-XR   Level  --  3  2  6  6    Speed  --  50  --  --  --   Minutes  --  15  15  15  15    METs  --  1.3  4.1  5.5  --     T5 Nustep   Level  --  --  4  --  4   Minutes  --  --  15  --  15   METs  --  --  3.2  --  --     Home Exercise Plan   Plans to continue exercise at  --  --  [employer] (comment) walking, [employer] (comment) walking, [employer] (comment) walking, [employer]   Frequency  --  --  Add 2 additional days to program exercise sessions.  Add 2 additional days to program exercise sessions.  Add 2 additional days to program exercise sessions.   Initial Home Exercises Provided  --  --  12/14/18  12/14/18  12/14/18   Row Name 02/02/19 1500 03/03/19 1500           Response to Exercise   Blood Pressure (Admit)  122/82  142/92      Blood Pressure (Exercise)  158/82  166/84      Blood Pressure (Exit)  100/72  130/70      Heart Rate (Admit)  72 bpm  92 bpm      Heart Rate (Exercise)  139 bpm  133 bpm      Heart Rate (Exit)  126 bpm  113 bpm      Rating of Perceived Exertion (Exercise)  13  11       Symptoms  none  none      Duration  Continue with 30 min of aerobic exercise without signs/symptoms of physical distress.  Continue with 30 min of aerobic exercise without signs/symptoms of physical distress.      Intensity  THRR unchanged  THRR unchanged        Progression   Progression  Continue to progress workloads to maintain intensity without signs/symptoms of physical distress.  Continue to progress workloads to maintain intensity without signs/symptoms of physical distress.  Average METs  4.3  4.2        Resistance Training   Training Prescription  Yes  Yes      Weight  4 lb  4 lb      Reps  10-15  10-15        Interval Training   Interval Training  No  No        Treadmill   MPH  2.8  --      Grade  1.5  --      Minutes  15  --      METs  3.75  --        Recumbant Bike   Level  --  5      RPM  --  50      Watts  --  34      Minutes  --  15        REL-XR   Level  6  3      Minutes  15  15      METs  4.3  4.2        T5 Nustep   Level  4  --      SPM  80  --      Minutes  15  --      METs  3  --        Home Exercise Plan   Plans to continue exercise at  [employer] (comment) walking, [employer]  --      Frequency  Add 2 additional days to program exercise sessions.  --      Initial Home Exercises Provided  12/14/18  --         Exercise Comments: Exercise Comments    Row Name 12/14/18 1708           Exercise Comments  Reviewed home exercise with pt today.  Pt plans to go to [employer] for exercise.  Reviewed THR, pulse, RPE, sign and symptoms, NTG use, and when to call 911 or MD.  Also discussed weather considerations and indoor options.  Pt voiced understanding.          Exercise Goals and Review: Exercise Goals    Row Name 11/30/18 1438             Exercise Goals   Increase Physical Activity  Yes       Intervention  Provide advice, education, support and counseling about physical activity/exercise needs.;Develop an  individualized exercise prescription for aerobic and resistive training based on initial evaluation findings, risk stratification, comorbidities and participant's personal goals.       Expected Outcomes  Short Term: Attend rehab on a regular basis to increase amount of physical activity.;Long Term: Add in home exercise to make exercise part of routine and to increase amount of physical activity.;Long Term: Exercising regularly at least 3-5 days a week.       Increase Strength and Stamina  Yes       Intervention  Provide advice, education, support and counseling about physical activity/exercise needs.;Develop an individualized exercise prescription for aerobic and resistive training based on initial evaluation findings, risk stratification, comorbidities and participant's personal goals.       Expected Outcomes  Short Term: Increase workloads from initial exercise prescription for resistance, speed, and METs.;Short Term: Perform resistance training exercises routinely during rehab and add in resistance training at home;Long Term: Improve cardiorespiratory fitness, muscular endurance and strength as measured by increased  METs and functional capacity (6MWT)       Able to understand and use rate of perceived exertion (RPE) scale  Yes       Intervention  Provide education and explanation on how to use RPE scale       Expected Outcomes  Short Term: Able to use RPE daily in rehab to express subjective intensity level;Long Term:  Able to use RPE to guide intensity level when exercising independently       Able to understand and use Dyspnea scale  Yes       Intervention  Provide education and explanation on how to use Dyspnea scale       Expected Outcomes  Short Term: Able to use Dyspnea scale daily in rehab to express subjective sense of shortness of breath during exertion;Long Term: Able to use Dyspnea scale to guide intensity level when exercising independently       Knowledge and understanding of Target Heart  Rate Range (THRR)  Yes       Intervention  Provide education and explanation of THRR including how the numbers were predicted and where they are located for reference       Expected Outcomes  Short Term: Able to state/look up THRR;Short Term: Able to use daily as guideline for intensity in rehab;Long Term: Able to use THRR to govern intensity when exercising independently       Able to check pulse independently  Yes       Intervention  Provide education and demonstration on how to check pulse in carotid and radial arteries.;Review the importance of being able to check your own pulse for safety during independent exercise       Expected Outcomes  Short Term: Able to explain why pulse checking is important during independent exercise;Long Term: Able to check pulse independently and accurately       Understanding of Exercise Prescription  Yes       Intervention  Provide education, explanation, and written materials on patient's individual exercise prescription       Expected Outcomes  Short Term: Able to explain program exercise prescription;Long Term: Able to explain home exercise prescription to exercise independently          Exercise Goals Re-Evaluation : Exercise Goals Re-Evaluation    Row Name 12/02/18 1703 12/10/18 1441 12/14/18 1709 12/23/18 1235 12/28/18 1607     Exercise Goal Re-Evaluation   Exercise Goals Review  Increase Physical Activity;Able to understand and use rate of perceived exertion (RPE) scale;Knowledge and understanding of Target Heart Rate Range (THRR);Understanding of Exercise Prescription;Increase Strength and Stamina;Able to check pulse independently  Increase Physical Activity;Increase Strength and Stamina;Able to understand and use rate of perceived exertion (RPE) scale;Knowledge and understanding of Target Heart Rate Range (THRR);Able to check pulse independently;Understanding of Exercise Prescription  Increase Physical Activity;Increase Strength and Stamina;Able to  understand and use rate of perceived exertion (RPE) scale;Knowledge and understanding of Target Heart Rate Range (THRR);Able to check pulse independently;Understanding of Exercise Prescription  Increase Physical Activity;Increase Strength and Stamina;Understanding of Exercise Prescription  Increase Physical Activity;Increase Strength and Stamina;Understanding of Exercise Prescription   Comments  Reviewed RPE scale, THR and program prescription with pt today.  Pt voiced understanding and was given a copy of goals to take home.  Dajon tolerates exercise well and seems to enjoy sessions.  Reviewed home exercise with pt today.  Pt plans to go to [employer] for exercise.  Reviewed THR, pulse, RPE, sign and symptoms, NTG use, and  when to call 911 or MD.  Also discussed weather considerations and indoor options.  Pt voiced understanding.  Vu has been doing well in rehab.  He is now on level 4 on the T5 NuStep.  He will be trying out the elliptical this week or next.  We will continue to monitor his progress.  --   Expected Outcomes  Short: Use RPE daily to regulate intensity. Long: Follow program prescription in THR.  Short - attend HT consistently Long :  increase MET level  Short - warm up and cool down correctly and check HR while at PF Long - maintain exercise on his own  Short: Try ellipitical and increase TM.  Long: Continue to walk more and go to [employer] on off days.  --   [place] Name 12/28/18 1629 01/05/19 1459 01/20/19 1548 01/25/19 1704 02/02/19 1514     Exercise Goal Re-Evaluation   Exercise Goals Review  Increase Physical Activity;Increase Strength and Stamina;Understanding of Exercise Prescription  Increase Physical Activity;Increase Strength and Stamina;Able to understand and use rate of perceived exertion (RPE) scale;Able to understand and use Dyspnea scale;Knowledge and understanding of Target Heart Rate Range (THRR);Able to check pulse independently;Understanding of Exercise Prescription   Increase Physical Activity;Increase Strength and Stamina;Understanding of Exercise Prescription  Increase Physical Activity;Increase Strength and Stamina  Increase Physical Activity;Increase Strength and Stamina;Able to understand and use rate of perceived exertion (RPE) scale;Knowledge and understanding of Target Heart Rate Range (THRR);Able to check pulse independently;Understanding of Exercise Prescription   Comments  Subhan is making progress with exercise levels in class and tried XR machine today for the first time. He is exercising at [employer] on days he does not have rehab. Main goal with exercise continues to be weight loss.  Davier continues to improve MET levels on machines and is losing weight slowly.  He has moved up to 4 lb strength training.  Most recent weight was 322 lb  Nazire has been doing well in rehab.  He is up to level 6 on the XR.  He is and level 3 on the recumbert elliptical.  We will continue to montior his progress.  Sharif is lifting 4 days a week apart from [facility]. He works out for about an hour and a half. Informed him that he needs rest. Patient understands instructions and why it is important to rest. Arius wants to maintain his progress when the program is over. He wnats to lose weight and be able to walk faster.  Vestal is still very consistent with exercise.  He does circuit training on machines in the 10 rep range.   Expected Outcomes  Short: Short term weight goal 325 lbs. Long: Establish an independent exercise routine. Long term weight goal 315 lbs.  Short - continue current program Long 315 lb and maintain exercise  Short: Talk about adding in intervals.  Long: Continue to work on weight loss.  Short: Able to go further with walking. Long: maintain and improve walking distance and run.  Short :  continue to be consistent with exercise Long : maintain  fitness when program is complete   Row Name 02/19/19 1115 03/03/19 1524           Exercise Goal Re-Evaluation    Exercise Goals Review  --  Increase Physical Activity;Increase Strength and Stamina;Able to understand and use rate of perceived exertion (RPE) scale;Able to understand and use Dyspnea scale;Knowledge and understanding of Target Heart Rate Range (THRR);Able to check pulse independently  Comments  Out since last review.  To return on 02/22/19.  Bethel continues to progress well.  He achieved another weight goal of below 300 lb.      Expected Outcomes  --  Short : - exercise consistently outside program sessions Long :  increase stamina         Discharge Exercise Prescription (Final Exercise Prescription Changes): Exercise Prescription Changes - 03/03/19 1500      Response to Exercise   Blood Pressure (Admit)  142/92    Blood Pressure (Exercise)  166/84    Blood Pressure (Exit)  130/70    Heart Rate (Admit)  92 bpm    Heart Rate (Exercise)  133 bpm    Heart Rate (Exit)  113 bpm    Rating of Perceived Exertion (Exercise)  11    Symptoms  none    Duration  Continue with 30 min of aerobic exercise without signs/symptoms of physical distress.    Intensity  THRR unchanged      Progression   Progression  Continue to progress workloads to maintain intensity without signs/symptoms of physical distress.    Average METs  4.2      Resistance Training   Training Prescription  Yes    Weight  4 lb    Reps  10-15      Interval Training   Interval Training  No      Recumbant Bike   Level  5    RPM  50    Watts  34    Minutes  15      REL-XR   Level  3    Minutes  15    METs  4.2       Nutrition:  Target Goals: Understanding of nutrition guidelines, daily intake of sodium <155m, cholesterol <2037m calories 30% from fat and 7% or less from saturated fats, daily to have 5 or more servings of fruits and vegetables.  Biometrics: Pre Biometrics - 11/30/18 1440      Pre Biometrics   Height  5' 8"  (1.727 m)    Weight  (!) 335 lb 12.8 oz (152.3 kg)    BMI (Calculated)  51.07     Single Leg Stand  30 seconds        Nutrition Therapy Plan and Nutrition Goals: Nutrition Therapy & Goals - 12/03/18 1403      Nutrition Therapy   Diet  low Na, HH, DM diet    Protein (specify units)  120g    Fiber  30 grams    Whole Grain Foods  3 servings    Saturated Fats  12 max. grams    Fruits and Vegetables  5 servings/day    Sodium  1.5 grams      Personal Nutrition Goals   Nutrition Goal  ST: eat more consistent meals LT: manage BG, get healthy    Comments  Sometimes skips breakfast, eggs and bacon when do have it. no lunch, sugar free kool aid squirts in water. D: no rice bread and limited potatoes. Meat (chicken, fish, steak) and greens (squash, zucchini, green beans) with gravy sometimes. will normally bake or pan fry with oil. Pt reports having limited appetite after heart event. Hunger cues growling in stomach. Discussed high vs low BG, DM, HH eating and general recommendations like eating consistently to ensure pt gets enough protein and kcal as well as manage BG. Discussed importance of eating and exercise. Discussed set point weight and health outcomes.  Intervention Plan   Intervention  Prescribe, educate and counsel regarding individualized specific dietary modifications aiming towards targeted core components such as weight, hypertension, lipid management, diabetes, heart failure and other comorbidities.;Nutrition handout(s) given to patient.    Expected Outcomes  Short Term Goal: Understand basic principles of dietary content, such as calories, fat, sodium, cholesterol and nutrients.;Short Term Goal: A plan has been developed with personal nutrition goals set during dietitian appointment.;Long Term Goal: Adherence to prescribed nutrition plan.       Nutrition Assessments: Nutrition Assessments - 12/01/18 1410      MEDFICTS Scores   Pre Score  48       Nutrition Goals Re-Evaluation: Nutrition Goals Re-Evaluation    Row Name 01/06/19 1640 01/18/19 1628  02/22/19 1712         Goals   Nutrition Goal  ST: add CHO before workout LT: manage BG, get healthy  ST: continue with current changes LT: manage BG, get healthy  ST: continue with current changes LT: manage BG, get healthy     Comment  Tuna after workouts. CHOs earlier in the day. Eats before 7pm. Energy been good, will feel faint when working out discussed adding a CHO before workout. Pt reports loving vegetables.  Pt reports energy is good. Pt reports doing well with diet and exercise and wants to continue. Discussed making sure getting enough calories and eating CHO before workout to fuel workout - a couple of times pt reports having low energy and dizziness when working out.  Pt reports energy and strength is good. Pt reports no changes to diet or exercise. Encouraged snacks pre/post workout     Expected Outcome  ST: add CHO before workout LT: manage BG, get healthy  ST: continue with current changes LT: manage BG, get healthy  ST: continue with current changes LT: manage BG, get healthy        Nutrition Goals Discharge (Final Nutrition Goals Re-Evaluation): Nutrition Goals Re-Evaluation - 02/22/19 1712      Goals   Nutrition Goal  ST: continue with current changes LT: manage BG, get healthy    Comment  Pt reports energy and strength is good. Pt reports no changes to diet or exercise. Encouraged snacks pre/post workout    Expected Outcome  ST: continue with current changes LT: manage BG, get healthy       Psychosocial: Target Goals: Acknowledge presence or absence of significant depression and/or stress, maximize coping skills, provide positive support system. Participant is able to verbalize types and ability to use techniques and skills needed for reducing stress and depression.   Initial Review & Psychosocial Screening: Initial Psych Review & Screening - 11/13/18 1311      Initial Review   Current issues with  Current Stress Concerns    Source of Stress Concerns  Chronic  Illness;Unable to participate in former interests or hobbies;Unable to perform yard/household activities    Comments  Dencil is struggling with the issues related to his HF, like SOB and fatigue. He has a great support system and his wife helps him with managing his medications. He is looking forward to starting HT so he can gain back some control in his life      [place]?  Yes      Barriers   Psychosocial barriers to participate in program  There are no identifiable barriers or psychosocial needs.;The patient should benefit from training in stress management and relaxation.  Screening Interventions   Interventions  Encouraged to exercise;Provide feedback about the scores to participant;To provide support and resources with identified psychosocial needs    Expected Outcomes  Short Term goal: Utilizing psychosocial counselor, staff and physician to assist with identification of specific Stressors or current issues interfering with healing process. Setting desired goal for each stressor or current issue identified.;Long Term Goal: Stressors or current issues are controlled or eliminated.;Long Term goal: The participant improves quality of Life and PHQ9 Scores as seen by post scores and/or verbalization of changes;Short Term goal: Identification and review with participant of any Quality of Life or Depression concerns found by scoring the questionnaire.       Quality of Life Scores:  Quality of Life - 11/30/18 1448      Quality of Life   Select  Quality of Life      Quality of Life Scores   Health/Function Pre  6.93 %    Socioeconomic Pre  13.71 %    Psych/Spiritual Pre  5.42 %    Family Pre  16.1 %    GLOBAL Pre  9.48 %      Scores of 19 and below usually indicate a poorer quality of life in these areas.  A difference of  2-3 points is a clinically meaningful difference.  A difference of 2-3 points in the total score of the Quality of Life Index has been  associated with significant improvement in overall quality of life, self-image, physical symptoms, and general health in studies assessing change in quality of life.  PHQ-9: Recent Review Flowsheet Data    Depression screen [facility] 2/9 03/03/2019 02/22/2019 11/30/2018 03/22/2018 12/19/2017   Decreased Interest 1 1 2 3  0   Down, Depressed, Hopeless 2 2 3 2  0   PHQ - 2 Score 3 3 5 5  0   Altered sleeping 1 1 1 1  0   Tired, decreased energy 3 1 3 3  0   Change in appetite 3 1 3 3  0   Feeling bad or failure about yourself  3 2 3 3  0   Trouble concentrating 2 1 3 1  0   Moving slowly or fidgety/restless 1 1 1 2  0   Suicidal thoughts 2 1 3  2  0   PHQ-9 Score 18 11 22 20  0   Difficult doing work/chores Somewhat difficult Somewhat difficult Extremely dIfficult Extremely dIfficult Not difficult at all     Interpretation of Total Score  Total Score Depression Severity:  1-4 = Minimal depression, 5-9 = Mild depression, 10-14 = Moderate depression, 15-19 = Moderately severe depression, 20-27 = Severe depression   Psychosocial Evaluation and Intervention: Psychosocial Evaluation - 11/13/18 1318      Psychosocial Evaluation & Interventions   Comments  Ernie is struggling with the issues related to his HF, like SOB and fatigue. He has a great support system and his wife helps him with managing his medications. He is looking forward to starting HT so he can gain back some control in his life    Expected Outcomes  Short: Attend [facility] to gain confidence in exercising and feel more "like himself". Long: develop self care habits.       Psychosocial Re-Evaluation: Psychosocial Re-Evaluation    [place] Name 12/28/18 1618 01/25/19 1710 02/22/19 1734         Psychosocial Re-Evaluation   Current issues with  History of Depression  Current Depression;Current Anxiety/Panic;Current Sleep Concerns  Current Depression;Current Anxiety/Panic;Current Sleep Concerns     Comments  Patient reports he has always struggled  some with depression in life but tries not to think too much about troubling things that could drag him down. Currently no reported new depression. Has a strong support system.  Chayne is getting his CPAP on Friday for sleep. He says this machine feels much better and he can tolerate it more. He only had 4 apnea events on his CPAP. He states that he does have anxiety but is on no medication to help him. He is going to speak to his doctor about his anxiety.  Reviewed patient health questionnaire (PHQ-9) with patient for follow up. Previously, patients score indicated signs/symptoms of depression.  Reviewed to see if patient is improving symptom wise while in program.  Score improved and patient states that it is because he states he has maritial problems and felt like he was a "waste of space" due to the circumstances. He wants to live for his daughter and no intentions of hurting himself.     Expected Outcomes  Short: Continue to attend cardiac rehab regularly Long: Manage mood with healthy mental health routine and habits.  Short: speak to doctor about anxiety. Long: take medications as prescribed if prescribed regularly.  Short: Continue to attend [facility] regularly for regular exercise and social engagement. Long: Continue to improve symptoms and manage a positive mental state.     Interventions  --  Encouraged to attend Cardiac Rehabilitation for the exercise  Relaxation education;Stress management education;Encouraged to attend Cardiac Rehabilitation for the exercise     Continue Psychosocial Services   Follow up required by staff  Follow up required by staff  Follow up required by staff        Psychosocial Discharge (Final Psychosocial Re-Evaluation): Psychosocial Re-Evaluation - 02/22/19 1734      Psychosocial Re-Evaluation   Current issues with  Current Depression;Current Anxiety/Panic;Current Sleep Concerns    Comments  Reviewed patient health questionnaire (PHQ-9) with patient for follow up.  Previously, patients score indicated signs/symptoms of depression.  Reviewed to see if patient is improving symptom wise while in program.  Score improved and patient states that it is because he states he has maritial problems and felt like he was a "waste of space" due to the circumstances. He wants to live for his daughter and no intentions of hurting himself.    Expected Outcomes  Short: Continue to attend [facility] regularly for regular exercise and social engagement. Long: Continue to improve symptoms and manage a positive mental state.    Interventions  Relaxation education;Stress management education;Encouraged to attend Cardiac Rehabilitation for the exercise    Continue Psychosocial Services   Follow up required by staff       Vocational Rehabilitation: Provide vocational rehab assistance to qualifying candidates.   Vocational Rehab Evaluation & Intervention: Vocational Rehab - 11/13/18 1312      Initial Vocational Rehab Evaluation & Intervention   Assessment shows need for Vocational Rehabilitation  No       Education: Education Goals: Education classes will be provided on a variety of topics geared toward better understanding of heart health and risk factor modification. Participant will state understanding/return demonstration of topics presented as noted by education test scores.  Learning Barriers/Preferences: Learning Barriers/Preferences - 11/13/18 1311      Learning Barriers/Preferences   Learning Barriers  None    Learning Preferences  None       Education Topics:  AED/CPR: - Group verbal and written instruction with the use of models to demonstrate the  basic use of the AED with the basic ABC's of resuscitation.   General Nutrition Guidelines/Fats and Fiber: -Group instruction provided by verbal, written material, models and posters to present the general guidelines for heart healthy nutrition. Gives an explanation and review of dietary fats and fiber.    Cardiac Rehab from 01/14/2019 in [facility] Cardiac and Pulmonary Rehab  Date  12/31/18  Educator  [facility]  Instruction Review Code  1- Verbalizes Understanding      Controlling Sodium/Reading Food Labels: -Group verbal and written material supporting the discussion of sodium use in heart healthy nutrition. Review and explanation with models, verbal and written materials for utilization of the food label.   Exercise Physiology & General Exercise Guidelines: - Group verbal and written instruction with models to review the exercise physiology of the cardiovascular system and associated critical values. Provides general exercise guidelines with specific guidelines to those with heart or lung disease.    Aerobic Exercise & Resistance Training: - Gives group verbal and written instruction on the various components of exercise. Focuses on aerobic and resistive training programs and the benefits of this training and how to safely progress through these programs..   Cardiac Rehab from 01/14/2019 in [facility] Cardiac and Pulmonary Rehab  Date  12/03/18  Educator  [facility]  Instruction Review Code  1- Verbalizes Understanding      Flexibility, Balance, Mind/Body Relaxation: Provides group verbal/written instruction on the benefits of flexibility and balance training, including mind/body exercise modes such as yoga, pilates and tai chi.  Demonstration and skill practice provided.   Cardiac Rehab from 01/14/2019 in [facility] Cardiac and Pulmonary Rehab  Date  12/31/18 [Balance 12/3]  Educator  [facility]  Instruction Review Code  1- Verbalizes Understanding      Stress and Anxiety: - Provides group verbal and written instruction about the health risks of elevated stress and causes of high stress.  Discuss the correlation between heart/lung disease and anxiety and treatment options. Review healthy ways to manage with stress and anxiety.   Depression: - Provides group verbal and written instruction on the correlation between  heart/lung disease and depressed mood, treatment options, and the stigmas associated with seeking treatment.   Anatomy & Physiology of the Heart: - Group verbal and written instruction and models provide basic cardiac anatomy and physiology, with the coronary electrical and arterial systems. Review of Valvular disease and Heart Failure   Cardiac Rehab from 01/14/2019 in [facility] Cardiac and Pulmonary Rehab  Date  01/14/19  Educator  [facility]  Instruction Review Code  1- Verbalizes Understanding      Cardiac Procedures: - Group verbal and written instruction to review commonly prescribed medications for heart disease. Reviews the medication, class of the drug, and side effects. Includes the steps to properly store meds and maintain the prescription regimen. (beta blockers and nitrates)   Cardiac Medications I: - Group verbal and written instruction to review commonly prescribed medications for heart disease. Reviews the medication, class of the drug, and side effects. Includes the steps to properly store meds and maintain the prescription regimen.   Cardiac Medications II: -Group verbal and written instruction to review commonly prescribed medications for heart disease. Reviews the medication, class of the drug, and side effects. (all other drug classes)   Cardiac Rehab from 01/14/2019 in [facility] Cardiac and Pulmonary Rehab  Date  12/03/18  Educator  [facility]  Instruction Review Code  1- Verbalizes Understanding       Go Sex-Intimacy & Heart Disease, Get SMART - Goal  Setting: - Group verbal and written instruction through game format to discuss heart disease and the return to sexual intimacy. Provides group verbal and written material to discuss and apply goal setting through the application of the S.M.A.R.T. Method.   Other Matters of the Heart: - Provides group verbal, written materials and models to describe Stable Angina and Peripheral Artery. Includes description of the disease process and  treatment options available to the cardiac patient.   Exercise & Equipment Safety: - Individual verbal instruction and demonstration of equipment use and safety with use of the equipment.   Cardiac Rehab from 01/14/2019 in [facility] Cardiac and Pulmonary Rehab  Date  11/30/18  Educator  AS  Instruction Review Code  1- Verbalizes Understanding      Infection Prevention: - Provides verbal and written material to individual with discussion of infection control including proper hand washing and proper equipment cleaning during exercise session.   Cardiac Rehab from 01/14/2019 in [facility] Cardiac and Pulmonary Rehab  Date  11/30/18  Educator  AS  Instruction Review Code  1- Verbalizes Understanding      Falls Prevention: - Provides verbal and written material to individual with discussion of falls prevention and safety.   Cardiac Rehab from 01/14/2019 in [facility] Cardiac and Pulmonary Rehab  Date  11/30/18  Educator  AS  Instruction Review Code  1- Verbalizes Understanding      Diabetes: - Individual verbal and written instruction to review signs/symptoms of diabetes, desired ranges of glucose level fasting, after meals and with exercise. Acknowledge that pre and post exercise glucose checks will be done for 3 sessions at entry of program.   Cardiac Rehab from 01/14/2019 in [facility] Cardiac and Pulmonary Rehab  Date  11/13/18  Educator  [facility]  Instruction Review Code  1- Verbalizes Understanding      Know Your Numbers and Risk Factors: -Group verbal and written instruction about important numbers in your health.  Discussion of what are risk factors and how they play a role in the disease process.  Review of Cholesterol, Blood Pressure, Diabetes, and BMI and the role they play in your overall health.   Cardiac Rehab from 01/14/2019 in [facility] Cardiac and Pulmonary Rehab  Date  12/03/18  Educator  [facility]  Instruction Review Code  1- Verbalizes Understanding      Sleep Hygiene: -Provides group verbal and  written instruction about how sleep can affect your health.  Define sleep hygiene, discuss sleep cycles and impact of sleep habits. Review good sleep hygiene tips.    Other: -Provides group and verbal instruction on various topics (see comments)   Knowledge Questionnaire Score:   Core Components/Risk Factors/Patient Goals at Admission: Personal Goals and Risk Factors at Admission - 11/30/18 1452      Core Components/Risk Factors/Patient Goals on Admission    Weight Management  Yes;Obesity;Weight Loss    Intervention  Weight Management/Obesity: Establish reasonable short term and long term weight goals.    Admit Weight  335 lb 12.8 oz (152.3 kg)    Goal Weight: Short Term  325 lb (147.4 kg)    Goal Weight: Long Term  315 lb (142.9 kg)    Intervention  Provide education about signs/symptoms and action to take for hypo/hyperglycemia.;Provide education about proper nutrition, including hydration, and aerobic/resistive exercise prescription along with prescribed medications to achieve blood glucose in normal ranges: Fasting glucose 65-99 mg/dL    Expected Outcomes  Short Term: Participant verbalizes understanding of the signs/symptoms and immediate care of hyper/hypoglycemia, proper  foot care and importance of medication, aerobic/resistive exercise and nutrition plan for blood glucose control.;Long Term: Attainment of HbA1C < 7%.    Heart Failure  Yes    Intervention  Provide a combined exercise and nutrition program that is supplemented with education, support and counseling about heart failure. Directed toward relieving symptoms such as shortness of breath, decreased exercise tolerance, and extremity edema.    Expected Outcomes  Improve functional capacity of life;Short term: Attendance in program 2-3 days a week with increased exercise capacity. Reported lower sodium intake. Reported increased fruit and vegetable intake. Reports medication compliance.;Short term: Daily weights obtained and  reported for increase. Utilizing diuretic protocols set by physician.;Long term: Adoption of self-care skills and reduction of barriers for early signs and symptoms recognition and intervention leading to self-care maintenance.    Intervention  Provide education on lifestyle modifcations including regular physical activity/exercise, weight management, moderate sodium restriction and increased consumption of fresh fruit, vegetables, and low fat dairy, alcohol moderation, and smoking cessation.;Monitor prescription use compliance.    Expected Outcomes  Short Term: Continued assessment and intervention until BP is < 140/46m HG in hypertensive participants. < 130/858mHG in hypertensive participants with diabetes, heart failure or chronic kidney disease.;Long Term: Maintenance of blood pressure at goal levels.    Intervention  Provide education and support for participant on nutrition & aerobic/resistive exercise along with prescribed medications to achieve LDL <7058mHDL >25m48m  Expected Outcomes  Short Term: Participant states understanding of desired cholesterol values and is compliant with medications prescribed. Participant is following exercise prescription and nutrition guidelines.;Long Term: Cholesterol controlled with medications as prescribed, with individualized exercise RX and with personalized nutrition plan. Value goals: LDL < 70mg38mL > 40 mg.       Core Components/Risk Factors/Patient Goals Review:  Goals and Risk Factor Review    Row Name 12/28/18 1608 01/25/19 1716 02/22/19 1729         Core Components/Risk Factors/Patient Goals Review   Personal Goals Review  Weight Management/Obesity;Heart Failure;Diabetes;Lipids;Hypertension  Weight Management/Obesity;Lipids;Hypertension;Heart Failure  --     Review  Weight was down to 328 lbs today. BrianXacharyrts his weight has been steadily decreasing and he is working out on the days he does not have rehab and [employer]. BrianFilomenoks his  blood sugar at home and reports mostly stable numbers. He weighs every day to help manage heart failure. Patient reports taking all meds as perscribed to manage BP and Lipids.  Patient is doing well with diabetes and watching his diet for his lipids. He wants to lose more weight which he has done already. BrianDerectaying positive and is improving in [facility] [place] blood pressure has still be and the higher side. He states he does have a bit of anxiety which could be another cause for his higher blood pressure.  BrianTalbothed in less than 300 for the first time today.  [facility] [employer] [facility] is taking all meds as directed.  His BG has been in 100-110 range and he has been able to come off some of his medications.     Expected Outcomes  Short: Short term weight goal 325. Long: Independently mange diabetes, heart failure, lipids, and hypertension with meds, exercise, and diet. Long term weight goal 315 lbs.  Short: lose weight to 299lbs. Long: weigh 200lbs.  Short :  continue to exercise and eat healthy Long :  reach goal weight  Core Components/Risk Factors/Patient Goals at Discharge (Final Review):  Goals and Risk Factor Review - 02/22/19 1729      Core Components/Risk Factors/Patient Goals Review   Review  Damarion weighed in less than 300 for the first time today.  He continues to exercise at [employer] on days not at [facility].  He is taking all meds as directed.  His BG has been in 100-110 range and he has been able to come off some of his medications.    Expected Outcomes  Short :  continue to exercise and eat healthy Long :  reach goal weight       ITP Comments: ITP Comments    Row Name 11/13/18 1338 11/30/18 1441 12/02/18 1703 12/03/18 1429 12/10/18 1720   ITP Comments  Virtual initial orientation completed. EP/RD orientation scheduled for 10/8 at 11am  Initial 6MWT complete.  ITP created and sent to Dr Sabra Heck  First full day of  exercise!  Patient was oriented to gym and equipment including functions, settings, policies, and procedures.  Patient's individual exercise prescription and treatment plan were reviewed.  All starting workloads were established based on the results of the 6 minute walk test done at initial orientation visit.  The plan for exercise progression was also introduced and progression will be customized based on patient's performance and goals.  Initial RD eval complete  Zachary was asking about weight gain. He was educated on fluid retention and heart failure. He was encouraged to weigh every day and was given a heart failure daily symptom check sheet.   [place] Name 12/16/18 0643 01/13/19 1019 02/10/19 0856 02/15/19 1634 03/01/19 1515   ITP Comments  30 day review completed. Continue with ITP sent to Dr. Emily Filbert, Medical Director of Cardiac and Pulmonary Rehab for review , changes as needed and signature.  30 day review competed . ITP sent to Dr Emily Filbert for review, changes as needed and ITP approval signature.  30 day review competed . ITP sent to Dr Emily Filbert for review, changes as needed and ITP approval signature  Donya reports exercising on his own every day at a community gym. He is doing cardio at least 3 days a week and strength trainig daily. He reports no symptoms or problems with his independent exercise.  Virtual F/U call done today. Patient had no new symptoms or concerns. He reports exercising most day of the week at a community gym, doing cardio and strength exercises. Continues to work on weight loss goal.   [place] Name 03/10/19 1353           ITP Comments  30 day review completed. ITP sent to Dr. Emily Filbert, Medical Director of Cardiac and Pulmonary Rehab. Continue with ITP unless changes are made by physician.  Department operating under reduced schedule until further notice by request from hospital leadership.          Comments: 30 day review

## 2019-03-13 ENCOUNTER — Encounter: Payer: Self-pay | Admitting: Family

## 2019-03-13 ENCOUNTER — Other Ambulatory Visit: Payer: Self-pay | Admitting: Family

## 2019-03-13 MED ORDER — CARVEDILOL 25 MG PO TABS: 25.0000 mg | ORAL_TABLET | Freq: Two times a day (BID) | ORAL | 1 refills | Status: DC

## 2019-03-15 ENCOUNTER — Encounter: Payer: Medicaid Other | Attending: Cardiovascular Disease

## 2019-03-15 DIAGNOSIS — I5022 Chronic systolic (congestive) heart failure: Secondary | ICD-10-CM | POA: Insufficient documentation

## 2019-03-15 MED ORDER — DIAZEPAM 5 MG PO TABS: 5.0000 mg | ORAL_TABLET | Freq: Three times a day (TID) | ORAL | 0 refills | Status: DC | PRN

## 2019-03-15 NOTE — Addendum Note (Signed)
Addended by: [name] on: 03/15/2019 03:15 PM   Modules accepted: Orders

## 2019-03-16 ENCOUNTER — Other Ambulatory Visit: Payer: Self-pay | Admitting: Physician Assistant

## 2019-03-16 DIAGNOSIS — H9121 Sudden idiopathic hearing loss, right ear: Secondary | ICD-10-CM

## 2019-03-19 ENCOUNTER — Ambulatory Visit
Admission: RE | Admit: 2019-03-19 | Discharge: 2019-03-19 | Disposition: A | Discharge status: Home / Self Care | Payer: Medicaid Other | Source: Ambulatory Visit | Attending: Physician Assistant | Admitting: Physician Assistant

## 2019-03-19 ENCOUNTER — Telehealth: Payer: Self-pay | Admitting: Cardiovascular Disease

## 2019-03-19 ENCOUNTER — Other Ambulatory Visit: Payer: Self-pay

## 2019-03-19 ENCOUNTER — Encounter (HOSPITAL_COMMUNITY): Payer: Self-pay | Admitting: *Deleted

## 2019-03-19 ENCOUNTER — Inpatient Hospital Stay (HOSPITAL_COMMUNITY)
Admission: EM | Admit: 2019-03-19 | Discharge: 2019-03-23 | DRG: 065 | Disposition: A | Discharge status: Home / Self Care | Payer: Medicaid Other | Attending: Internal Medicine | Admitting: Internal Medicine

## 2019-03-19 DIAGNOSIS — R29702 NIHSS score 2: Secondary | ICD-10-CM | POA: Diagnosis present

## 2019-03-19 DIAGNOSIS — H538 Other visual disturbances: Secondary | ICD-10-CM | POA: Diagnosis present

## 2019-03-19 DIAGNOSIS — I10 Essential (primary) hypertension: Secondary | ICD-10-CM | POA: Diagnosis present

## 2019-03-19 DIAGNOSIS — E1122 Type 2 diabetes mellitus with diabetic chronic kidney disease: Secondary | ICD-10-CM | POA: Diagnosis present

## 2019-03-19 DIAGNOSIS — I130 Hypertensive heart and chronic kidney disease with heart failure and stage 1 through stage 4 chronic kidney disease, or unspecified chronic kidney disease: Secondary | ICD-10-CM | POA: Diagnosis present

## 2019-03-19 DIAGNOSIS — G4733 Obstructive sleep apnea (adult) (pediatric): Secondary | ICD-10-CM | POA: Diagnosis present

## 2019-03-19 DIAGNOSIS — N179 Acute kidney failure, unspecified: Secondary | ICD-10-CM | POA: Diagnosis present

## 2019-03-19 DIAGNOSIS — Z8349 Family history of other endocrine, nutritional and metabolic diseases: Secondary | ICD-10-CM

## 2019-03-19 DIAGNOSIS — I5022 Chronic systolic (congestive) heart failure: Secondary | ICD-10-CM | POA: Diagnosis present

## 2019-03-19 DIAGNOSIS — G8191 Hemiplegia, unspecified affecting right dominant side: Secondary | ICD-10-CM | POA: Diagnosis present

## 2019-03-19 DIAGNOSIS — E1151 Type 2 diabetes mellitus with diabetic peripheral angiopathy without gangrene: Secondary | ICD-10-CM | POA: Diagnosis present

## 2019-03-19 DIAGNOSIS — E782 Mixed hyperlipidemia: Secondary | ICD-10-CM | POA: Diagnosis not present

## 2019-03-19 DIAGNOSIS — H9201 Otalgia, right ear: Secondary | ICD-10-CM | POA: Diagnosis present

## 2019-03-19 DIAGNOSIS — I63541 Cerebral infarction due to unspecified occlusion or stenosis of right cerebellar artery: Secondary | ICD-10-CM | POA: Diagnosis not present

## 2019-03-19 DIAGNOSIS — N1832 Chronic kidney disease, stage 3b: Secondary | ICD-10-CM | POA: Diagnosis present

## 2019-03-19 DIAGNOSIS — E1121 Type 2 diabetes mellitus with diabetic nephropathy: Secondary | ICD-10-CM | POA: Diagnosis present

## 2019-03-19 DIAGNOSIS — Z8249 Family history of ischemic heart disease and other diseases of the circulatory system: Secondary | ICD-10-CM | POA: Diagnosis not present

## 2019-03-19 DIAGNOSIS — Z20822 Contact with and (suspected) exposure to covid-19: Secondary | ICD-10-CM | POA: Diagnosis present

## 2019-03-19 DIAGNOSIS — Z800 Family history of malignant neoplasm of digestive organs: Secondary | ICD-10-CM

## 2019-03-19 DIAGNOSIS — Z79899 Other long term (current) drug therapy: Secondary | ICD-10-CM

## 2019-03-19 DIAGNOSIS — R4781 Slurred speech: Secondary | ICD-10-CM | POA: Diagnosis present

## 2019-03-19 DIAGNOSIS — Z794 Long term (current) use of insulin: Secondary | ICD-10-CM

## 2019-03-19 DIAGNOSIS — I639 Cerebral infarction, unspecified: Secondary | ICD-10-CM | POA: Diagnosis not present

## 2019-03-19 DIAGNOSIS — I5042 Chronic combined systolic (congestive) and diastolic (congestive) heart failure: Secondary | ICD-10-CM | POA: Diagnosis present

## 2019-03-19 DIAGNOSIS — R270 Ataxia, unspecified: Secondary | ICD-10-CM | POA: Diagnosis present

## 2019-03-19 DIAGNOSIS — E1165 Type 2 diabetes mellitus with hyperglycemia: Secondary | ICD-10-CM | POA: Diagnosis present

## 2019-03-19 DIAGNOSIS — IMO0002 Reserved for concepts with insufficient information to code with codable children: Secondary | ICD-10-CM

## 2019-03-19 DIAGNOSIS — F419 Anxiety disorder, unspecified: Secondary | ICD-10-CM | POA: Diagnosis present

## 2019-03-19 DIAGNOSIS — I6302 Cerebral infarction due to thrombosis of basilar artery: Secondary | ICD-10-CM | POA: Diagnosis not present

## 2019-03-19 DIAGNOSIS — K130 Diseases of lips: Secondary | ICD-10-CM | POA: Diagnosis not present

## 2019-03-19 DIAGNOSIS — E785 Hyperlipidemia, unspecified: Secondary | ICD-10-CM | POA: Diagnosis present

## 2019-03-19 DIAGNOSIS — Z6841 Body Mass Index (BMI) 40.0 and over, adult: Secondary | ICD-10-CM

## 2019-03-19 DIAGNOSIS — N183 Chronic kidney disease, stage 3 unspecified: Secondary | ICD-10-CM

## 2019-03-19 DIAGNOSIS — Z823 Family history of stroke: Secondary | ICD-10-CM

## 2019-03-19 DIAGNOSIS — Z7982 Long term (current) use of aspirin: Secondary | ICD-10-CM

## 2019-03-19 DIAGNOSIS — R200 Anesthesia of skin: Secondary | ICD-10-CM | POA: Diagnosis present

## 2019-03-19 DIAGNOSIS — H9121 Sudden idiopathic hearing loss, right ear: Secondary | ICD-10-CM

## 2019-03-19 DIAGNOSIS — Z833 Family history of diabetes mellitus: Secondary | ICD-10-CM

## 2019-03-19 DIAGNOSIS — N1831 Chronic kidney disease, stage 3a: Secondary | ICD-10-CM | POA: Diagnosis present

## 2019-03-19 DIAGNOSIS — I361 Nonrheumatic tricuspid (valve) insufficiency: Secondary | ICD-10-CM | POA: Diagnosis not present

## 2019-03-19 DIAGNOSIS — E876 Hypokalemia: Secondary | ICD-10-CM | POA: Diagnosis present

## 2019-03-19 LAB — URINALYSIS, ROUTINE W REFLEX MICROSCOPIC
Bilirubin Urine: NEGATIVE
Glucose, UA: >=500 mg/dL — AB
Hgb urine dipstick: NEGATIVE
Ketones, ur: 5 mg/dL — AB
Leukocytes,Ua: NEGATIVE
Nitrite: NEGATIVE
Protein, ur: NEGATIVE mg/dL
Specific Gravity, Urine: 1.033 — ABNORMAL HIGH (ref 1.005–1.030)
pH: 5 (ref 5.0–8.0)

## 2019-03-19 LAB — ETHANOL: Alcohol, Ethyl (B): <10 mg/dL (ref <10)

## 2019-03-19 LAB — CBC WITH DIFFERENTIAL/PLATELET
Abs Immature Granulocytes: 0.02 10*3/uL (ref 0.00–0.07)
Basophils Absolute: 0 10*3/uL (ref 0.0–0.1)
Basophils Relative: 0 %
Eosinophils Absolute: 0.1 10*3/uL (ref 0.0–0.5)
Eosinophils Relative: 2 %
HCT: 48 % (ref 39.0–52.0)
Hemoglobin: 15.1 g/dL (ref 13.0–17.0)
Immature Granulocytes: 0 %
Lymphocytes Relative: 26 %
Lymphs Abs: 1.6 10*3/uL (ref 0.7–4.0)
MCH: 26.9 pg (ref 26.0–34.0)
MCHC: 31.5 g/dL (ref 30.0–36.0)
MCV: 85.6 fL (ref 80.0–100.0)
Monocytes Absolute: 0.4 10*3/uL (ref 0.1–1.0)
Monocytes Relative: 7 %
Neutro Abs: 4.1 10*3/uL (ref 1.7–7.7)
Neutrophils Relative %: 65 %
Platelets: 227 10*3/uL (ref 150–400)
RBC: 5.61 MIL/uL (ref 4.22–5.81)
RDW: 16.1 % — ABNORMAL HIGH (ref 11.5–15.5)
WBC: 6.3 10*3/uL (ref 4.0–10.5)
nRBC: 0 % (ref 0.0–0.2)

## 2019-03-19 LAB — RAPID URINE DRUG SCREEN, HOSP PERFORMED
Amphetamines: NOT DETECTED
Barbiturates: NOT DETECTED
Benzodiazepines: POSITIVE — AB
Cocaine: NOT DETECTED
Opiates: NOT DETECTED
Tetrahydrocannabinol: NOT DETECTED

## 2019-03-19 LAB — COMPREHENSIVE METABOLIC PANEL
ALT: 22 U/L (ref 0–44)
AST: 19 U/L (ref 15–41)
Albumin: 3.3 g/dL — ABNORMAL LOW (ref 3.5–5.0)
Alkaline Phosphatase: 72 U/L (ref 38–126)
Anion gap: 13 (ref 5–15)
BUN: 22 mg/dL — ABNORMAL HIGH (ref 6–20)
CO2: 23 mmol/L (ref 22–32)
Calcium: 9.1 mg/dL (ref 8.9–10.3)
Chloride: 101 mmol/L (ref 98–111)
Creatinine, Ser: 2.63 mg/dL — ABNORMAL HIGH (ref 0.61–1.24)
GFR calc Af Amer: 34 mL/min — ABNORMAL LOW (ref >60)
GFR calc non Af Amer: 30 mL/min — ABNORMAL LOW (ref >60)
Glucose, Bld: 214 mg/dL — ABNORMAL HIGH (ref 70–99)
Potassium: 4 mmol/L (ref 3.5–5.1)
Sodium: 137 mmol/L (ref 135–145)
Total Bilirubin: 1.1 mg/dL (ref 0.3–1.2)
Total Protein: 6.7 g/dL (ref 6.5–8.1)

## 2019-03-19 LAB — APTT: aPTT: 36 seconds (ref 24–36)

## 2019-03-19 IMAGING — MR MR BRAIN/IAC WO/W CM
13 of 14 series · 45 of 48 positions shown · IV contrast (20ml Multihance)
Comparison: None.

CLINICAL DATA: Recent onset dizziness, confusion, right hearing
loss, history of stroke

EXAM:
MRI HEAD WITHOUT AND WITH CONTRAST
TECHNIQUE: Multiplanar, multiecho pulse sequences of the brain and surrounding
structures were obtained without and with intravenous contrast.
CONTRAST:  20mL MULTIHANCE GADOBENATE DIMEGLUMINE 529 MG/ML IV SOLN

[Series 5: T1 · sagittal · 4.0mm · 0.72mm/px · 3 of 27 slices shown (1 of 3)]
[im 1/27]
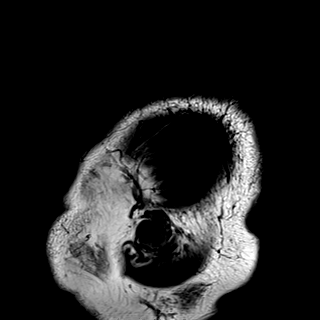
[im 14/27]
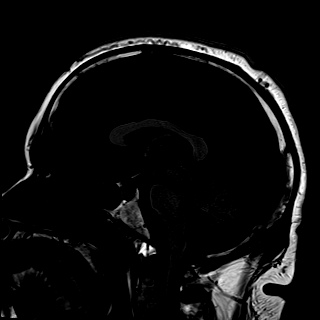
[im 27/27]
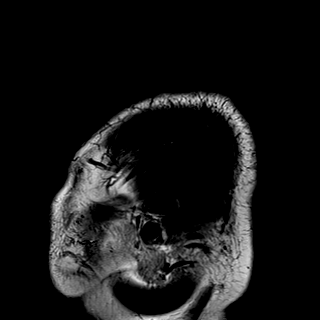

[Series 6: DWI · axial · 3.0mm · 1.44mm/px · z∈[-58,+81]mm · 7 of 88 slices shown (1 of 4)]
[im 1/88]
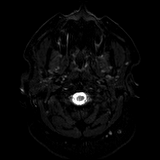
[im 15/88]
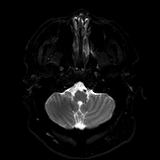
[im 30/88]
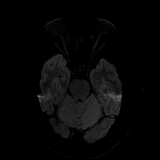
[im 44/88]
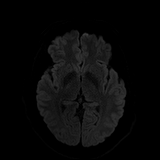
[im 59/88]
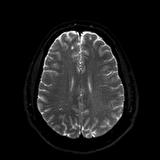
[im 73/88]
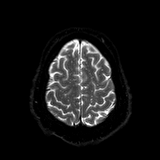
[im 88/88]
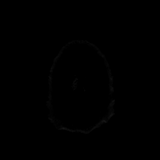

[Series 7: DWI · axial · 3.0mm · 1.44mm/px · z∈[-58,+81]mm · 3 of 43 slices shown (2 of 4)]
[im 1/43]
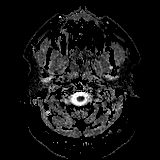
[im 22/43]
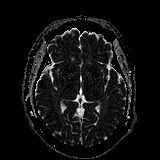
[im 43/43]
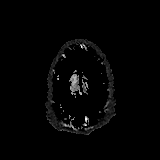

[Series 8: T2 · axial · 4.0mm · 0.36mm/px · z∈[-59,+79]mm · 2 of 28 slices shown]
[im 1/28]
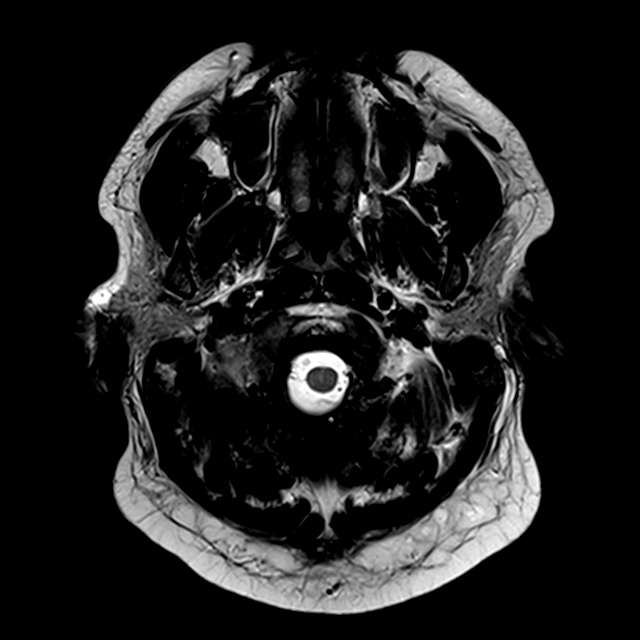
[im 28/28]
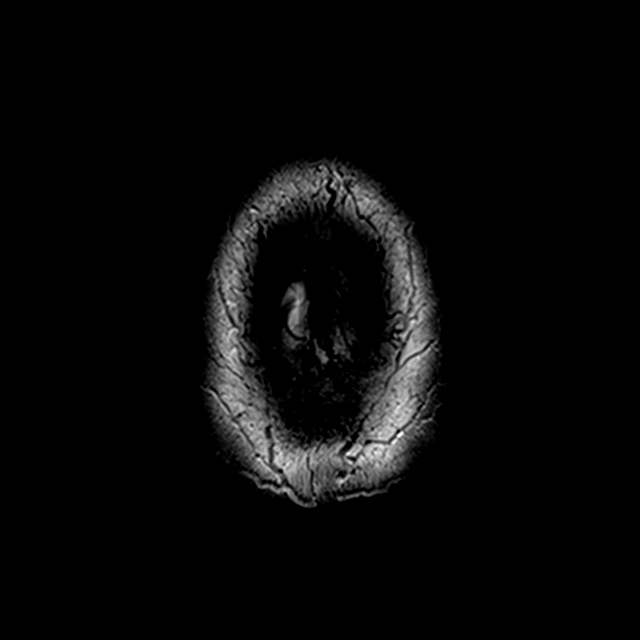

[Series 9: DWI · coronal · 5.0mm · 1.44mm/px · 5 of 60 slices shown (3 of 4)]
[im 1/60]
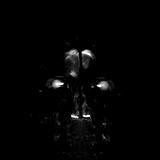
[im 15/60]
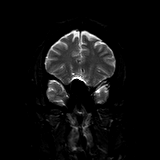
[im 30/60]
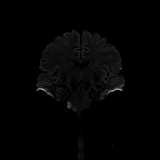
[im 45/60]
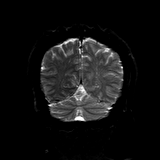
[im 60/60]
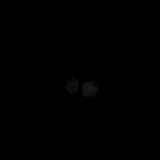

[Series 10: DWI · coronal · 5.0mm · 1.44mm/px · 2 of 30 slices shown (4 of 4)]
[im 1/30]
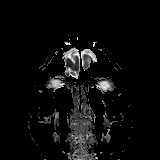
[im 30/30]
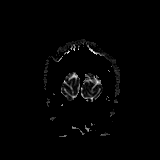

[Series 11: T1 · coronal · 2.5mm · 0.56mm/px · 1 of 13 slices shown (2 of 3)]
[im 1/13]
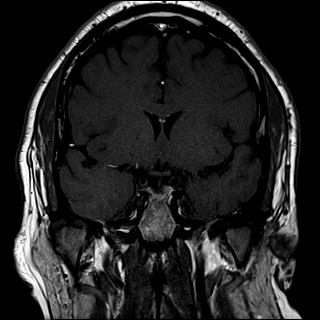

[Series 12: FLAIR · axial · 3.0mm · 0.72mm/px · z∈[-70,+89]mm · 2 of 28 slices shown]
[im 1/28]
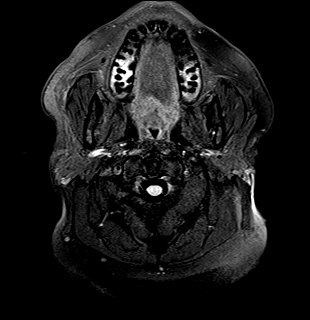
[im 28/28]
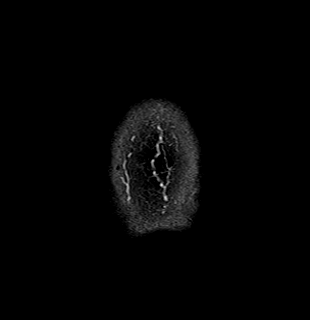

[Series 14: swi_images · axial · 3.0mm · 0.90mm/px · z∈[-60,+79]mm · 4 of 48 slices shown]
[im 1/48]
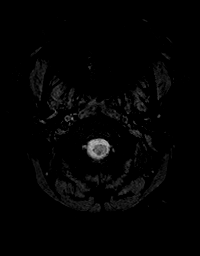
[im 16/48]
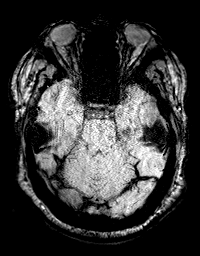
[im 32/48]
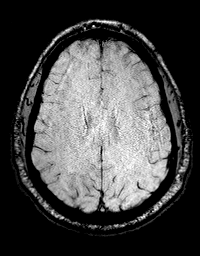
[im 48/48]
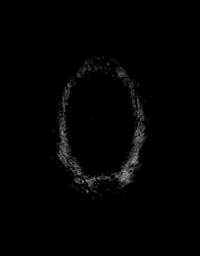

[Series 15: T1 · axial · 2.5mm · 0.50mm/px · 1 of 13 slices shown (3 of 3)]
[im 1/13]
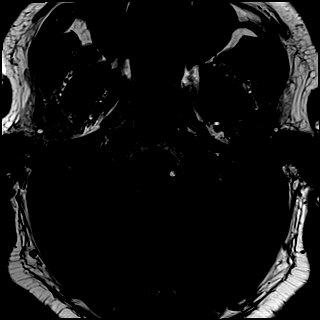

[Series 17: T1 post-contrast · coronal · 2.5mm · 0.56mm/px · 1 of 13 slices shown (1 of 3)]
[im 1/13]
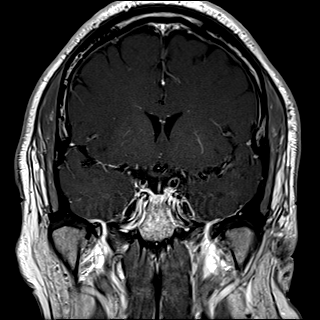

[Series 18: T1 post-contrast · axial · 2.5mm · 0.50mm/px · 1 of 13 slices shown (2 of 3)]
[im 1/13]
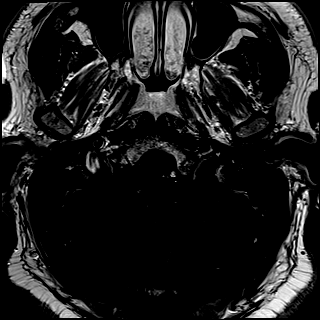

[Series 19: T1 post-contrast · axial · 1.0mm · 0.90mm/px · z∈[-70,+87]mm · 13 of 160 slices shown (3 of 3)]
[im 1/160]
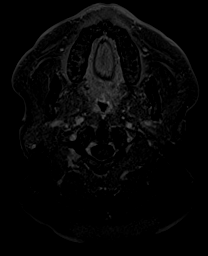
[im 14/160]
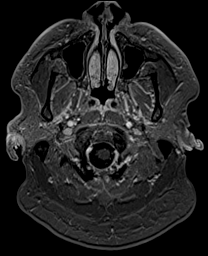
[im 27/160]
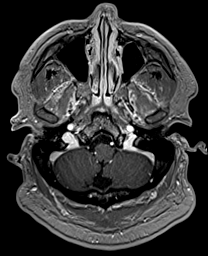
[im 40/160]
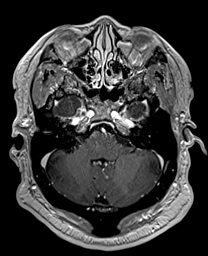
[im 54/160]
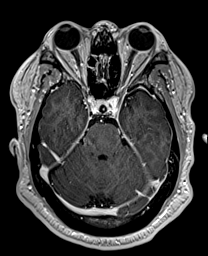
[im 67/160]
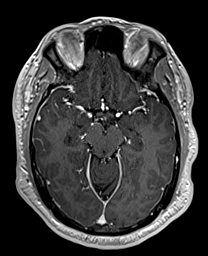
[im 80/160]
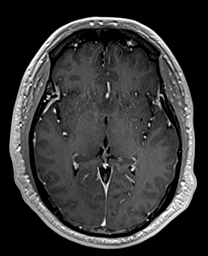
[im 93/160]
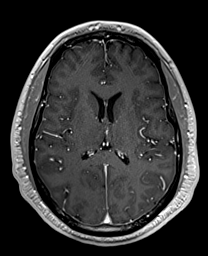
[im 107/160]
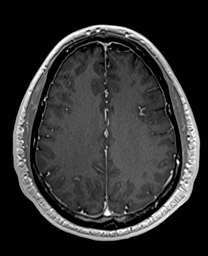
[im 120/160]
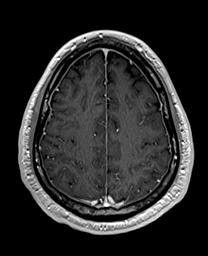
[im 133/160]
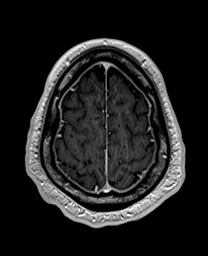
[im 146/160]
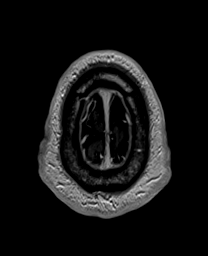
[im 160/160]
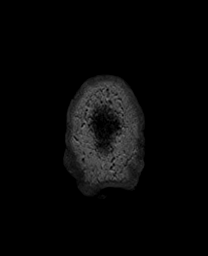

[45 of 48 positions shown; findings below may reference images not displayed]

FINDINGS: Brain: There is reduced diffusion involving the right brachium
pontis. There is some corresponding enhancement. No evidence of
intracranial hemorrhage.

There is a small subcentimeter focus of T2 hyperintensity near the
right frontal horn, which may reflect a chronic small vessel
infarct.

There is no cerebellopontine angle mass. Inner ear structures
demonstrate an unremarkable MR appearance. There is no abnormal
enhancement within the internal auditory canals.

Ventricles and sulci are normal in size and configuration. There is
no mass effect or edema. There is no extra-axial fluid collection.

Vascular: Major vessel flow voids at the skull base are preserved.

Skull and upper cervical spine: Normal marrow signal is preserved.

Sinuses/Orbits: Paranasal sinuses are clear. The orbits are
unremarkable.

Other: The sella is unremarkable.  Mastoid air cells are clear.
IMPRESSION: Small acute infarction involving the right middle cerebellar
peduncle. Corresponding enhancement may reflect early blood brain
barrier disruption. Given presence of vascular risk factors,
ischemia is favored over demyelination. There is also a probable
small chronic infarct near the right frontal horn.

These results will be called to the ordering clinician or
representative by the Radiologist Assistant, and communication
documented in the PACS or zVision Dashboard.

## 2019-03-19 MED ORDER — ASPIRIN 325 MG PO TABS
325.0000 mg | ORAL_TABLET | Freq: Every day | ORAL | Status: DC
  Administered 2019-03-20 – 2019-03-23 (×4): 325 mg via ORAL
  Filled 2019-03-19 (×4): qty 1

## 2019-03-19 MED ORDER — STROKE: EARLY STAGES OF RECOVERY BOOK
Freq: Once | Status: AC
  Filled 2019-03-19: qty 1

## 2019-03-19 MED ORDER — ATORVASTATIN CALCIUM 80 MG PO TABS
80.0000 mg | ORAL_TABLET | Freq: Every day | ORAL | Status: DC
  Administered 2019-03-20 – 2019-03-22 (×3): 80 mg via ORAL
  Filled 2019-03-19 (×3): qty 1

## 2019-03-19 MED ORDER — SODIUM CHLORIDE 0.9 % IV BOLUS
250.0000 mL | Freq: Once | INTRAVENOUS | Status: AC
  Administered 2019-03-19: 250 mL via INTRAVENOUS

## 2019-03-19 MED ORDER — ACETAMINOPHEN 650 MG RE SUPP: 650.0000 mg | RECTAL | Status: DC | PRN

## 2019-03-19 MED ORDER — CARVEDILOL 12.5 MG PO TABS: 25.0000 mg | ORAL_TABLET | Freq: Two times a day (BID) | ORAL | Status: DC

## 2019-03-19 MED ORDER — ACETAMINOPHEN 160 MG/5ML PO SOLN: 650.0000 mg | ORAL | Status: DC | PRN

## 2019-03-19 MED ORDER — ASPIRIN 300 MG RE SUPP: 300.0000 mg | Freq: Every day | RECTAL | Status: DC

## 2019-03-19 MED ORDER — PANTOPRAZOLE SODIUM 40 MG PO TBEC
40.0000 mg | DELAYED_RELEASE_TABLET | Freq: Every day | ORAL | Status: DC
  Administered 2019-03-20 – 2019-03-23 (×4): 40 mg via ORAL
  Filled 2019-03-19 (×4): qty 1

## 2019-03-19 MED ORDER — ENOXAPARIN SODIUM 40 MG/0.4ML ~~LOC~~ SOLN
40.0000 mg | SUBCUTANEOUS | Status: DC
  Administered 2019-03-20 – 2019-03-23 (×4): 40 mg via SUBCUTANEOUS
  Filled 2019-03-19 (×4): qty 0.4

## 2019-03-19 MED ORDER — GADOBENATE DIMEGLUMINE 529 MG/ML IV SOLN
20.0000 mL | Freq: Once | INTRAVENOUS | Status: AC | PRN
  Administered 2019-03-19: 13:00:00 20 mL via INTRAVENOUS

## 2019-03-19 MED ORDER — ACETAMINOPHEN 325 MG PO TABS
650.0000 mg | ORAL_TABLET | ORAL | Status: DC | PRN
  Administered 2019-03-21 – 2019-03-22 (×3): 650 mg via ORAL
  Filled 2019-03-19 (×3): qty 2

## 2019-03-19 MED ORDER — AMLODIPINE BESYLATE 5 MG PO TABS: 5.0000 mg | ORAL_TABLET | Freq: Every day | ORAL | Status: DC

## 2019-03-19 MED ORDER — INSULIN ASPART 100 UNIT/ML ~~LOC~~ SOLN
0.0000 [IU] | Freq: Three times a day (TID) | SUBCUTANEOUS | Status: DC
  Administered 2019-03-21 – 2019-03-22 (×2): 1 [IU] via SUBCUTANEOUS

## 2019-03-19 MED ORDER — INSULIN DETEMIR 100 UNIT/ML ~~LOC~~ SOLN
45.0000 [IU] | Freq: Two times a day (BID) | SUBCUTANEOUS | Status: DC
  Administered 2019-03-20 – 2019-03-21 (×4): 45 [IU] via SUBCUTANEOUS
  Filled 2019-03-19 (×6): qty 0.45

## 2019-03-19 NOTE — Telephone Encounter (Signed)
Spoke with wife and she reports that patient has been having dizziness for weeks and slurred speech. He had MRI done and it showed positive for stroke. She states that he went to ED and that they told him he had a ear infection. Advised that if he is still having active symptoms he needs to go to ED. Also advised that she call his neurologist to make them aware of his symptoms and review imaging as well. Let her know that they would need to review and interpret those results. She reports that she is on the other phone with them now. Again advised that he go to ED for further workup and assessment if he is still having active symptoms. She verbalized understanding with no further questions.

## 2019-03-19 NOTE — ED Provider Notes (Signed)
[facility] [place] EMERGENCY DEPARTMENT Provider Note   CSN: [id number] Arrival date & time: 03/19/19  1452     History Chief Complaint  Patient presents with  . Numbness    Joel [name] is a 39 y.o. male with a hx of anxiety, CHF, diabetes, hyperlipidemia, hypertension, obesity, renal disorder, CVA presents to the Emergency Department complaining of gradual, persistent, progressively worsening right sided facial numbness, tongue numbness, speech difficulty and right ear tinnitis onset approx 1 month ago.  Pt reports he was initially treated for an ear infection, but this did not improve symptoms.  He reports associated pain in his right ear.  He reports he had an MRI today and was told he had suffered a stroke.  He was advised to come to the ER for treatment.  Pt denies aggravating or alleviating symptoms.  Pt denies anticoagulant use but does take low dose ASA daily.  Pt denies infectious symptoms.    Discussed with wife on the phone.  She reports he was working out daily until one day when he "over did it" became dizzy and fell.  He was taken to [facility] and he was d/c home with dx of and ear infection.  ENT evaluated pt on Thurs (last week) without evidence of ear infection and MRI was ordered. MRI was performed today.  She reports pt is stumbling every time he attempts to walk.    The history is provided by the patient and medical records. No language interpreter was used.       Past Medical History:  Diagnosis Date  . Anxiety   . Chronic diastolic CHF (congestive heart failure) ([place])   . Diabetes mellitus without complication ([place])   . Hyperlipidemia   . Hypertension   . Hypertensive heart disease   . Noncompliance with medications   . Obesity   . Renal disorder   . Stroke ([place])   . Systolic dysfunction    a. TTE 06/2014: EF 45-50%, normal wall motion, mild MR, mild AI    Patient Active Problem List   Diagnosis Date Noted  . Acute CVA (cerebrovascular accident)  ([place]) 03/19/2019  . Type 1 diabetes mellitus with stage 3 chronic kidney disease ([place]) 05/21/2018  . Depression, major, recurrent, moderate ([place]) 12/19/2017  . Fatigue 04/07/2017  . Dilated cardiomyopathy ([place]) 02/23/2017  . Hypertensive heart disease with heart failure ([place]) 02/23/2017  . Essential hypertension 02/21/2017  . Uncontrolled type 2 diabetes mellitus with nephropathy ([place]) 02/21/2017  . H/O medication noncompliance 02/12/2017  . Chronic kidney disease (CKD), stage III (moderate) 02/12/2017  . Systolic CHF, chronic ([place]) 10/11/2014    Past Surgical History:  Procedure Laterality Date  . NO PAST SURGERIES         Family History  Problem Relation Age of Onset  . Hypertension Mother   . Hyperlipidemia Mother   . Diabetes Mother   . CAD Father        a. stenting in his early 10s  . Heart disease Father   . Stroke Father   . Diabetes Maternal Grandmother   . Diabetes Maternal Grandfather   . CAD Paternal Grandmother   . CAD Paternal Grandfather   . Colon cancer Paternal Grandfather   . Diabetes Paternal Uncle     Social History   Tobacco Use  . Smoking status: Never Smoker  . Smokeless tobacco: Never Used  Substance Use Topics  . Alcohol use: Not Currently    Comment: Very infrequently; ~2-3 drinks/year   .  Drug use: No    Home Medications Prior to Admission medications   Medication Sig Start Date End Date Taking? Authorizing Provider  amLODipine (NORVASC) 5 MG tablet Take 1 tablet (5 mg total) by mouth daily. 02/23/18   [name], FNP  aspirin 81 MG chewable tablet Chew 1 tablet (81 mg total) by mouth daily. 10/13/14   Milagros Loll, MD  atorvastatin (LIPITOR) 80 MG tablet Take 1 tablet (80 mg total) by mouth daily at 6 PM. 02/23/18   [name], FNP  carvedilol (COREG) 25 MG tablet Take 1 tablet (25 mg total) by mouth 2 (two) times daily with a meal. 03/13/19   [name], FNP  diazepam (VALIUM) 5 MG tablet Take 1 tablet (5 mg total) by  mouth every 8 (eight) hours as needed (dizziness or vertigo). 03/15/19   Karamalegos, Netta Neat, DO  empagliflozin (JARDIANCE) 25 MG TABS tablet Take by mouth. 10/15/18   [provider]  esomeprazole (NEXIUM) 20 MG capsule Take 20 mg by mouth daily at 12 noon.    [provider]  fluticasone (FLONASE) 50 MCG/ACT nasal spray Place 2 sprays into both nostrils daily. Use for 4-6 weeks then stop and use seasonally or as needed. 02/19/19   Karamalegos, Netta Neat, DO  furosemide (LASIX) 20 MG tablet Take 1 tablet (20 mg total) by mouth daily. For 5-10 days then as needed 03/04/19   [name], DO  HUMALOG 100 UNIT/ML injection 10 units at breakfast 10 units at lunch 10 units at dinner 11/09/18   [provider]  insulin aspart (NOVOLOG) 100 UNIT/ML injection Inject 5 Units into the skin 3 (three) times daily with meals. 02/14/17   [name], MD  insulin detemir (LEVEMIR) 100 UNIT/ML injection Inject 0.45 mLs (45 Units total) into the skin 2 (two) times daily. 05/08/18   Karamalegos, Netta Neat, DO  liraglutide (VICTOZA) 18 MG/3ML SOPN Inject into the skin. 11/04/18 11/04/19  [provider]  meclizine (ANTIVERT) 25 MG tablet Take 1 tablet (25 mg total) by mouth 3 (three) times daily as needed for dizziness or nausea. 02/23/19   Emily Filbert, MD  ofloxacin (FLOXIN) 0.3 % OTIC solution Place 5 drops into the right ear 2 (two) times daily. For 7 days 03/04/19   [name], DO  ondansetron (ZOFRAN ODT) 4 MG disintegrating tablet Take 1-2 tablets (4-8 mg total) by mouth every 8 (eight) hours as needed for nausea or vomiting. 03/04/19   Althea Charon, Netta Neat, DO  predniSONE (DELTASONE) 20 MG tablet Take 3 tablets daily (60mg ) for 3 days, take 2 tab daily (20mg ) for 4 days, take 1 tab daily (20mg ) for 2 days 03/04/19   , DO  sacubitril-valsartan (ENTRESTO) 97-103 MG Take 1 tablet by mouth 2 (two) times daily. 08/22/18    03/06/19, FNP  spironolactone (ALDACTONE) 25 MG tablet Take 25 mg by mouth daily.    [provider]    Allergies    Bidil [isosorb dinitrate-hydralazine] and Nitroglycerin  Review of Systems   Review of Systems  Constitutional: Negative for appetite change, diaphoresis, fatigue, fever and unexpected weight change.  HENT: Negative for mouth sores.   Eyes: Positive for visual disturbance.  Respiratory: Negative for cough, chest tightness, shortness of breath and wheezing.   Cardiovascular: Negative for chest pain.  Gastrointestinal: Negative for abdominal pain, constipation, diarrhea, nausea and vomiting.  Endocrine: Negative for polydipsia, polyphagia and polyuria.  Genitourinary: Negative for dysuria, frequency, hematuria  and urgency.  Musculoskeletal: Negative for back pain and neck stiffness.  Skin: Negative for rash.  Allergic/Immunologic: Negative for immunocompromised state.  Neurological: Positive for dizziness, speech difficulty and numbness. Negative for syncope, light-headedness and headaches.  Hematological: Does not bruise/bleed easily.  Psychiatric/Behavioral: Negative for sleep disturbance. The patient is not nervous/anxious.     Physical Exam Updated Vital Signs BP 92/79 (BP Location: Right Arm)   Pulse (!) 106   Temp 98.3 F (36.8 C) (Oral)   Resp 12   Ht 5\' 7"  (1.702 m)   Wt 122.5 kg   SpO2 96%   BMI 42.29 kg/m   Physical Exam Vitals and nursing note reviewed.  Constitutional:      General: He is not in acute distress.    Appearance: He is well-developed. He is not diaphoretic.  HENT:     Head: Normocephalic and atraumatic.     Right Ear: External ear normal.     Left Ear: External ear normal.     Nose: Nose normal.     Mouth/Throat:     Mouth: Mucous membranes are moist.  Eyes:     General: Visual field deficit present. No scleral icterus.    Extraocular Movements: Extraocular movements intact.     Conjunctiva/sclera: Conjunctivae  normal.     Pupils: Pupils are equal, round, and reactive to light.     Comments: No horizontal, vertical or rotational nystagmus Decreased visibility in the lateral and superior fields of the right eye.  All other peripheral fields are within normal limits.  Neck:     Comments: Full active and passive ROM without pain No midline or paraspinal tenderness No nuchal rigidity or meningeal signs Cardiovascular:     Rate and Rhythm: Regular rhythm. Tachycardia present.     Pulses:          Radial pulses are 2+ on the right side and 2+ on the left side.  Pulmonary:     Effort: Pulmonary effort is normal. No respiratory distress.  Abdominal:     Palpations: Abdomen is soft.     Tenderness: There is no abdominal tenderness. There is no guarding or rebound.  Musculoskeletal:        General: Normal range of motion.     Cervical back: Normal range of motion and neck supple.     Comments: Ataxia with bilateral arms and legs, worst on the right  Lymphadenopathy:     Cervical: No cervical adenopathy.  Skin:    General: Skin is warm and dry.     Findings: No rash.  Neurological:     Mental Status: He is alert and oriented to person, place, and time.     Cranial Nerves: No cranial nerve deficit.     Sensory: Sensory deficit present.     Motor: No abnormal muscle tone.     Coordination: Finger-Nose-Finger Test abnormal and Heel to abnormal.     Comments: Mental Status:  Alert, oriented, thought content appropriate. Speech somewhat difficult. Able to follow 2 step commands without difficulty.  Cranial Nerves:  II:  Peripheral visual fields with field cut in the superior and lateral right eye, pupils equal, round, reactive to light III,IV, VI: ptosis not present, extra-ocular motions intact bilaterally  V,VII: smile symmetric, facial light touch sensation diminished on the right VIII: hearing significantly decreased on the right  IX,X: midline uvula rise  XI: bilateral shoulder shrug  equal and strong XII: midline tongue extension  Motor:  5/5 in upper  and lower extremities bilaterally including strong and equal grip strength and dorsiflexion/plantar flexion Sensory: light touch normal in all extremities.  Cerebellar: Ataxia with finger-to-nose in the bilateral upper extremities, worse on the right.  Ataxia noted with heel shin bilaterally worse on the right.   Gait: Not tested given reported significant problems with balance and repetitive falling CV: distal pulses palpable throughout   Psychiatric:        Behavior: Behavior normal.        Thought Content: Thought content normal.        Judgment: Judgment normal.     ED Results / Procedures / Treatments   Labs (all labs ordered are listed, but only abnormal results are displayed) Labs Reviewed  CBC WITH DIFFERENTIAL/PLATELET - Abnormal; Notable for the following components:      Result Value   RDW 16.1 (*)    All other components within normal limits  COMPREHENSIVE METABOLIC PANEL - Abnormal; Notable for the following components:   Glucose, Bld 214 (*)    BUN 22 (*)    Creatinine, Ser 2.63 (*)    Albumin 3.3 (*)    GFR calc non Af Amer 30 (*)    GFR calc Af Amer 34 (*)    All other components within normal limits  RAPID URINE DRUG SCREEN, HOSP PERFORMED - Abnormal; Notable for the following components:   Benzodiazepines POSITIVE (*)    All other components within normal limits  URINALYSIS, ROUTINE W REFLEX MICROSCOPIC - Abnormal; Notable for the following components:   APPearance HAZY (*)    Specific Gravity, Urine 1.033 (*)    Glucose, UA >=500 (*)    Ketones, ur 5 (*)    Bacteria, UA FEW (*)    All other components within normal limits  SARS CORONAVIRUS 2 (TAT 6-24 HRS)  APTT  ETHANOL  TROPONIN I (HIGH SENSITIVITY)    EKG ED ECG REPORT   Date: 03/19/2019  Rate: 76  Rhythm: normal sinus rhythm  QRS Axis: left  Intervals: normal  ST/T Wave abnormalities: nonspecific T wave changes   Conduction Disutrbances:none  Narrative Interpretation:   Old EKG Reviewed: changes noted  I have personally reviewed the EKG tracing and agree with the computerized printout as noted.    Radiology MR Melven Sartorius WO CONTRAST  Result Date: 03/19/2019 CLINICAL DATA:  Recent onset dizziness, confusion, right hearing loss, history of stroke EXAM: MRI HEAD WITHOUT AND WITH CONTRAST TECHNIQUE: Multiplanar, multiecho pulse sequences of the brain and surrounding structures were obtained without and with intravenous contrast. CONTRAST:  59mL MULTIHANCE GADOBENATE DIMEGLUMINE 529 MG/ML IV SOLN COMPARISON:  None. FINDINGS: Brain: There is reduced diffusion involving the right brachium pontis. There is some corresponding enhancement. No evidence of intracranial hemorrhage. There is a small subcentimeter focus of T2 hyperintensity near the right frontal horn, which may reflect a chronic small vessel infarct. There is no cerebellopontine angle mass. Inner ear structures demonstrate an unremarkable MR appearance. There is no abnormal enhancement within the internal auditory canals. Ventricles and sulci are normal in size and configuration. There is no mass effect or edema. There is no extra-axial fluid collection. Vascular: Major vessel flow voids at the skull base are preserved. Skull and upper cervical spine: Normal marrow signal is preserved. Sinuses/Orbits: Paranasal sinuses are clear. The orbits are unremarkable. Other: The sella is unremarkable.  Mastoid air cells are clear. IMPRESSION: Small acute infarction involving the right middle cerebellar peduncle. Corresponding enhancement may reflect early blood brain barrier disruption. Given presence  of vascular risk factors, ischemia is favored over demyelination. There is also a probable small chronic infarct near the right frontal horn. These results will be called to the ordering clinician or representative by the Radiologist Assistant, and communication documented  in the PACS or zVision Dashboard. Electronically Signed   By: Guadlupe Spanish M.D.   On: 03/19/2019 13:13    Procedures .Critical Care Performed by: Dierdre Forth, PA-C Authorized by: Dierdre Forth, PA-C   Critical care provider statement:    Critical care time (minutes):  45   Critical care time was exclusive of:  Separately billable procedures and treating other patients and teaching time   Critical care was necessary to treat or prevent imminent or life-threatening deterioration of the following conditions:  CNS failure or compromise   Critical care was time spent personally by me on the following activities:  Discussions with consultants, evaluation of patient's response to treatment, examination of patient, ordering and performing treatments and interventions, ordering and review of laboratory studies, ordering and review of radiographic studies, pulse oximetry, re-evaluation of patient's condition, obtaining history from patient or surrogate and review of old charts   I assumed direction of critical care for this patient from another provider in my specialty: no     (including critical care time)  Medications Ordered in ED Medications  sodium chloride 0.9 % bolus 250 mL (0 mLs Intravenous Stopped 03/19/19 2050)    ED Course  I have reviewed the triage vital signs and the nursing notes.  Pertinent labs & imaging results that were available during my care of the patient were reviewed by me and considered in my medical decision making (see chart for details).    MDM Rules/Calculators/A&P                      Patient presents emergency department with an outpatient MRI showing acute CVA.  On neuro exam he has visual field cuts, slurred speech, right-sided hearing deficits and ataxia with the bilateral upper and lower extremities, worse on the right.  Discussing with his wife, he is unable to ambulate at home due to severe ataxia and recurrent falls.  Wife reports this is  very abnormal.  Patient with a complicated medical history.  Discussed with Dr. Otelia Limes of neurology who will evaluate.  Discussed with Dr. Toniann Fail of hospitalist service who will admit.  The patient was discussed with Dr. Criss Alvine who agrees with the treatment plan.  11:11 PM ECG verified with new T wave changes.  Pt denies CP or SOB.  Admitting Physician notified.    Final Clinical Impression(s) / ED Diagnoses Final diagnoses:  Cerebrovascular accident (CVA), unspecified mechanism (HCC)  Ataxia  Slurred speech    Rx / DC Orders ED Discharge Orders    None       Mykaela Arena, Boyd Kerbs 03/19/19 2315    Pricilla Loveless, MD 03/23/19 [phone]

## 2019-03-19 NOTE — H&P (Signed)
History and Physical    Joel [name] XFG:[id number] DOB: 1980-05-05 DOA: 03/19/2019  PCP: [name], DO  Patient coming from: Home.  Chief Complaint: Ataxia and facial numbness.  HPI: Joel [name] is a 39 y.o. male with history of nonischemic cardiomyopathy last EF measured was 30% with grade 2 diastolic dysfunction in 9678 with history of diabetes mellitus type 1, chronic disease stage III hypertension had started experiencing right facial numbness and difficulty getting on the right side for almost 3 weeks now with initial having some vomiting.  Patient was treated for vestibular problems and referred to the ENT who referred patient to the ER given the concern for possible stroke.  Patient during this time also started having ataxia.  Also had some visual blurriness.  Denies any headache fever chills.  ED Course: In the ER patient was found to have mild ataxia.  And was able to move all extremities 5 x 5.  MRI brain shows acute infarct along the right cerebellar peduncle.  Neurologist on-call was consulted.  Patient passed swallow.  Was placed on aspirin Plavix and admitted for further stroke work-up.  EKG shows normal sinus rhythm with IVCD.  Labs show worsening renal function with creatinine of 2.6 baseline is usually around 2.  Was given to 30 cc normal saline bolus.  Covid test was negative.  Review of Systems: As per HPI, rest all negative.   Past Medical History:  Diagnosis Date  . Anxiety   . Chronic diastolic CHF (congestive heart failure) ([place])   . Diabetes mellitus without complication ([place])   . Hyperlipidemia   . Hypertension   . Hypertensive heart disease   . Noncompliance with medications   . Obesity   . Renal disorder   . Stroke ([place])   . Systolic dysfunction    a. TTE 06/2014: EF 45-50%, normal wall motion, mild MR, mild AI    Past Surgical History:  Procedure Laterality Date  . NO PAST SURGERIES       reports that he has never smoked. He has never  used smokeless tobacco. He reports previous alcohol use. He reports that he does not use drugs.  Allergies  Allergen Reactions  . Bidil [Isosorb Dinitrate-Hydralazine] Other (See Comments)    Severe headache  . Nitroglycerin Other (See Comments)    Causes severe headaches    Family History  Problem Relation Age of Onset  . Hypertension Mother   . Hyperlipidemia Mother   . Diabetes Mother   . CAD Father        a. stenting in his early 60s  . Heart disease Father   . Stroke Father   . Diabetes Maternal Grandmother   . Diabetes Maternal Grandfather   . CAD Paternal Grandmother   . CAD Paternal Grandfather   . Colon cancer Paternal Grandfather   . Diabetes Paternal Uncle     Prior to Admission medications   Medication Sig Start Date End Date Taking? Authorizing Provider  amLODipine (NORVASC) 5 MG tablet Take 1 tablet (5 mg total) by mouth daily. 02/23/18   Alisa Graff, FNP  aspirin 81 MG chewable tablet Chew 1 tablet (81 mg total) by mouth daily. 10/13/14   Hillary Bow, MD  atorvastatin (LIPITOR) 80 MG tablet Take 1 tablet (80 mg total) by mouth daily at 6 PM. 02/23/18   Alisa Graff, FNP  carvedilol (COREG) 25 MG tablet Take 1 tablet (25 mg total) by mouth 2 (two) times daily with a meal.  03/13/19   [name], FNP  diazepam (VALIUM) 5 MG tablet Take 1 tablet (5 mg total) by mouth every 8 (eight) hours as needed (dizziness or vertigo). 03/15/19   Karamalegos, Netta Neat, DO  empagliflozin (JARDIANCE) 25 MG TABS tablet Take by mouth. 10/15/18   [provider]  esomeprazole (NEXIUM) 20 MG capsule Take 20 mg by mouth daily at 12 noon.    [provider]  fluticasone (FLONASE) 50 MCG/ACT nasal spray Place 2 sprays into both nostrils daily. Use for 4-6 weeks then stop and use seasonally or as needed. 02/19/19   Karamalegos, Netta Neat, DO  furosemide (LASIX) 20 MG tablet Take 1 tablet (20 mg total) by mouth daily. For 5-10 days then as needed 03/04/19    [name], DO  HUMALOG 100 UNIT/ML injection 10 units at breakfast 10 units at lunch 10 units at dinner 11/09/18   [provider]  insulin aspart (NOVOLOG) 100 UNIT/ML injection Inject 5 Units into the skin 3 (three) times daily with meals. 02/14/17   [name], MD  insulin detemir (LEVEMIR) 100 UNIT/ML injection Inject 0.45 mLs (45 Units total) into the skin 2 (two) times daily. 05/08/18   Karamalegos, Netta Neat, DO  liraglutide (VICTOZA) 18 MG/3ML SOPN Inject into the skin. 11/04/18 11/04/19  [provider]  meclizine (ANTIVERT) 25 MG tablet Take 1 tablet (25 mg total) by mouth 3 (three) times daily as needed for dizziness or nausea. 02/23/19   Emily Filbert, MD  ofloxacin (FLOXIN) 0.3 % OTIC solution Place 5 drops into the right ear 2 (two) times daily. For 7 days 03/04/19   [name], DO  ondansetron (ZOFRAN ODT) 4 MG disintegrating tablet Take 1-2 tablets (4-8 mg total) by mouth every 8 (eight) hours as needed for nausea or vomiting. 03/04/19   Althea Charon, Netta Neat, DO  predniSONE (DELTASONE) 20 MG tablet Take 3 tablets daily (60mg ) for 3 days, take 2 tab daily (20mg ) for 4 days, take 1 tab daily (20mg ) for 2 days 03/04/19   , DO  sacubitril-valsartan (ENTRESTO) 97-103 MG Take 1 tablet by mouth 2 (two) times daily. 08/22/18   03/06/19, FNP  spironolactone (ALDACTONE) 25 MG tablet Take 25 mg by mouth daily.    [provider]    Physical Exam: Constitutional: Moderately built and nourished. Vitals:   03/19/19 1945 03/19/19 2050 03/19/19 2105 03/19/19 2200  BP: 103/80 123/80 125/78 109/75  Pulse: 79 78 76 78  Resp: 16 18 19 18   Temp:   97.6 F (36.4 C) 97.7 F (36.5 C)  TempSrc:   Oral Oral  SpO2: 95% 98% 97% 96%  Weight:      Height:       Eyes: Anicteric no pallor. ENMT: No discharge from the ears eyes nose or mouth. Neck: No mass or.  No neck rigidity. Respiratory: No rhonchi or  crepitations. Cardiovascular: S1-S2 heard. Abdomen: Soft nontender bowel sounds present. Musculoskeletal: No edema. Skin: No rash. Neurologic: Alert awake oriented to time place and person.  Moves all extremities 5 x 5.  No facial asymmetry tongue is midline pupils equal and reactive to light.  Difficulty hearing right side. Psychiatric: Appears normal but normal affect.   Labs on Admission: I have personally reviewed following labs and imaging studies  CBC: Recent Labs  Lab 03/19/19 1521  WBC 6.3  NEUTROABS 4.1  HGB 15.1  HCT 48.0  MCV 85.6  PLT 227   Basic Metabolic Panel:  Recent Labs  Lab 03/19/19 1521  NA 137  K 4.0  CL 101  CO2 23  GLUCOSE 214*  BUN 22*  CREATININE 2.63*  CALCIUM 9.1   GFR: Estimated Creatinine Clearance: 48.2 mL/min (A) (by C-G formula based on SCr of 2.63 mg/dL (H)). Liver Function Tests: Recent Labs  Lab 03/19/19 1521  AST 19  ALT 22  ALKPHOS 72  BILITOT 1.1  PROT 6.7  ALBUMIN 3.3*   No results for input(s): LIPASE, AMYLASE in the last 168 hours. No results for input(s): AMMONIA in the last 168 hours. Coagulation Profile: No results for input(s): INR, PROTIME in the last 168 hours. Cardiac Enzymes: No results for input(s): CKTOTAL, CKMB, CKMBINDEX, TROPONINI in the last 168 hours. BNP (last 3 results) No results for input(s): PROBNP in the last 8760 hours. HbA1C: No results for input(s): HGBA1C in the last 72 hours. CBG: No results for input(s): GLUCAP in the last 168 hours. Lipid Profile: No results for input(s): CHOL, HDL, LDLCALC, TRIG, CHOLHDL, LDLDIRECT in the last 72 hours. Thyroid Function Tests: No results for input(s): TSH, T4TOTAL, FREET4, T3FREE, THYROIDAB in the last 72 hours. Anemia Panel: No results for input(s): VITAMINB12, FOLATE, FERRITIN, TIBC, IRON, RETICCTPCT in the last 72 hours. Urine analysis:    Component Value Date/Time   COLORURINE YELLOW 03/19/2019 1940   APPEARANCEUR HAZY (A) 03/19/2019 1940    LABSPEC 1.033 (H) 03/19/2019 1940   PHURINE 5.0 03/19/2019 1940   GLUCOSEU >=500 (A) 03/19/2019 1940   HGBUR NEGATIVE 03/19/2019 1940   BILIRUBINUR NEGATIVE 03/19/2019 1940   KETONESUR 5 (A) 03/19/2019 1940   PROTEINUR NEGATIVE 03/19/2019 1940   NITRITE NEGATIVE 03/19/2019 1940   LEUKOCYTESUR NEGATIVE 03/19/2019 1940   Sepsis Labs: @LABRCNTIP (procalcitonin:4,lacticidven:4) )No results found for this or any previous visit (from the past 240 hour(s)).   Radiological Exams on Admission: MR BRAIN/IAC W WO CONTRAST  Result Date: 03/19/2019 CLINICAL DATA:  Recent onset dizziness, confusion, right hearing loss, history of stroke EXAM: MRI HEAD WITHOUT AND WITH CONTRAST TECHNIQUE: Multiplanar, multiecho pulse sequences of the brain and surrounding structures were obtained without and with intravenous contrast. CONTRAST:  90mL MULTIHANCE GADOBENATE DIMEGLUMINE 529 MG/ML IV SOLN COMPARISON:  None. FINDINGS: Brain: There is reduced diffusion involving the right brachium pontis. There is some corresponding enhancement. No evidence of intracranial hemorrhage. There is a small subcentimeter focus of T2 hyperintensity near the right frontal horn, which may reflect a chronic small vessel infarct. There is no cerebellopontine angle mass. Inner ear structures demonstrate an unremarkable MR appearance. There is no abnormal enhancement within the internal auditory canals. Ventricles and sulci are normal in size and configuration. There is no mass effect or edema. There is no extra-axial fluid collection. Vascular: Major vessel flow voids at the skull base are preserved. Skull and upper cervical spine: Normal marrow signal is preserved. Sinuses/Orbits: Paranasal sinuses are clear. The orbits are unremarkable. Other: The sella is unremarkable.  Mastoid air cells are clear. IMPRESSION: Small acute infarction involving the right middle cerebellar peduncle. Corresponding enhancement may reflect early blood brain barrier  disruption. Given presence of vascular risk factors, ischemia is favored over demyelination. There is also a probable small chronic infarct near the right frontal horn. These results will be called to the ordering clinician or representative by the Radiologist Assistant, and communication documented in the PACS or zVision Dashboard. Electronically Signed   By: 30m M.D.   On: 03/19/2019 13:13    EKG: Independently reviewed.  Normal sinus rhythm  with IVCD.  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 Acute CVA -discussed with neurologist Dr. Amada Jupiter at this time plan is to get MRA of the brain and MRA of the neck without contrast since patient has chronic kidney disease.  Aspirin Plavix after loading statins neurochecks 2D echo.  Patient passed swallow.  Check hemoglobin A1c lipid panel. 2. Diabetes mellitus type 1 on Levemir with sliding scale coverage.  Follow hemoglobin A1c. 3. Acute on chronic kidney disease stage III was given normal saline bolus in the ER.  Follow metabolic panel. 4. Hypertension on amlodipine and Coreg. 5. Nonischemic cardiomyopathy last EF was 25 to 30%.  Holding diuretics for now until creatinine improves. 6. Sleep apnea on CPAP.  Given the acute CVA will need inpatient status for close monitoring for any deterioration.   DVT prophylaxis: Lovenox. Code Status: Full code. Family Communication: Discussed with patient. Disposition Plan: Home. Consults called: Neurology. Admission status: Inpatient.   Eduard Clos MD [facility] Pager [phone].  If 7PM-7AM, please contact night-coverage [email or web address] Password [email or web address]  03/19/2019, 11:59 PM

## 2019-03-19 NOTE — Consult Note (Signed)
Neurology Consultation Reason for Consult: Stroke Referring Physician: Muthersbaugh, H  CC: Dizziness  History is obtained from: Patient  HPI: Joel [name] is a 39 y.o. male with history of diabetes, hypertension, hyperlipidemia, CHF who presents with dizziness that has been going on for 3 to 4 weeks, as well as facial numbness that started on the past few days.  He also noted that his hearing went out in his right ear when this initially started. He sought care and was told that it was likely due to ear problem due to the hearing going on in that ear.  He had significant nausea and vomiting at that time, but that has since improved.  He has had gait abnormality since that time and this is not improving.  Over the past few days, he noticed some facial numbness which prompted to seek care again today for reevaluation at this time he had an MRI which demonstrates an AICA territory infarct.   LKW: 3 weeks ago tpa given?: no, out of window   ROS: A 14 point ROS was performed and is negative except as noted in the HPI.   Past Medical History:  Diagnosis Date  . Anxiety   . Chronic diastolic CHF (congestive heart failure) (HCC)   . Diabetes mellitus without complication ([facility]CC)   . Hyperlipidemia   . Hypertension   . Hypertensive heart disease   . Noncompliance with medications   . Obesity   . Renal disorder   . Stroke ([facility])   . Systolic dysfunction    a. TTE 06/2014: EF 45-50%, normal wall motion, mild MR, mild AI     Family History  Problem Relation Age of Onset  . Hypertension Mother   . Hyperlipidemia Mother   . Diabetes Mother   . CAD Father        a. stenting in his early 77s  . Heart disease Father   . Stroke Father   . Diabetes Maternal Grandmother   . Diabetes Maternal Grandfather   . CAD Paternal Grandmother   . CAD Paternal Grandfather   . Colon cancer Paternal Grandfather   . Diabetes Paternal Uncle      Social History:  reports that he has never smoked. He  has never used smokeless tobacco. He reports previous alcohol use. He reports that he does not use drugs.   Exam: Current vital signs: BP 125/78   Pulse 76   Temp 97.6 F (36.4 C) (Oral)   Resp 19   Ht 5\' 7"  (1.702 m)   Wt 122.5 kg   SpO2 97%   BMI 42.29 kg/m  Vital signs in last 24 hours: Temp:  [97.6 F (36.4 C)-98.3 F (36.8 C)] 97.6 F (36.4 C) (02/05 2105) Pulse Rate:  [76-106] 76 (02/05 2105) Resp:  [12-19] 19 (02/05 2105) BP: (86-125)/(68-80) 125/78 (02/05 2105) SpO2:  [95 %-98 %] 97 % (02/05 2105) Weight:  [122.5 kg] 122.5 kg (02/05 1514)   Physical Exam  Constitutional: Appears well-developed and well-nourished.  Psych: Affect appropriate to situation Eyes: No scleral injection HENT: No OP obstrucion MSK: no joint deformities.  Cardiovascular: Normal rate and regular rhythm.  Respiratory: Effort normal, non-labored breathing GI: Soft.  No distension. There is no tenderness.  Skin: WDI  Neuro: Mental Status: Patient is awake, alert, oriented to person, place, month, year, and situation. Patient is able to give a clear and coherent history. No signs of aphasia or neglect Cranial Nerves: II: Visual Fields are full. Pupils are equal, round,  and reactive to light.   III,IV, VI: EOMI without ptosis or diploplia.  He has saccadic smooth pursuit. V, VII: Facial movement with right facial numbness and mild right facial weakness VIII: hearing is intact to voice X: Uvula elevates symmetrically XI: Shoulder shrug is symmetric. XII: tongue is midline without atrophy or fasciculations.  Motor: Tone is normal. Bulk is normal. 5/5 strength was present on the left, he does have mild drift and weakness in both right arm and leg Sensory: Sensation is symmetric to light touch and temperature in the arms and legs. Cerebellar: FNF and HKS are intact on the left, impaired on the right    I have reviewed labs in epic and the results pertinent to this consultation  are: Creatinine 2.6 Glucose 214  I have reviewed the images obtained: MRI brain-AICA territory infarct  Impression: 39 year old male with aica infarct.  I suspect is most likely thrombotic, though embolic is possible.  He will need further evaluation and physical therapy.  Recommendations: - HgbA1c, fasting lipid panel - Frequent neuro checks - Echocardiogram -MRA head neck (no contrast due to renal dysfunction) - Prophylactic therapy-Antiplatelet med: Aspirin -81 mg daily with Plavix 75 mg daily following 200 mg load - Risk factor modification - Telemetry monitoring - PT consult, OT consult, Speech consult - Stroke team to follow   Roland Rack, MD [facility] [phone]  If 7pm- 7am, please page neurology on call as listed in [place].

## 2019-03-19 NOTE — Telephone Encounter (Signed)
Patient wife calling to discuss cva symptoms currently at home after MRI

## 2019-03-19 NOTE — ED Notes (Signed)
Shanda Bumps [phone]) called for an update.  Thank you

## 2019-03-19 NOTE — ED Triage Notes (Signed)
C/o numbness in the right side of his face states he had an MRI today and was told he had a stroke

## 2019-03-20 ENCOUNTER — Inpatient Hospital Stay (HOSPITAL_COMMUNITY): Payer: Medicaid Other

## 2019-03-20 ENCOUNTER — Encounter (HOSPITAL_COMMUNITY): Payer: Self-pay | Admitting: Radiology

## 2019-03-20 DIAGNOSIS — I361 Nonrheumatic tricuspid (valve) insufficiency: Secondary | ICD-10-CM

## 2019-03-20 LAB — COMPREHENSIVE METABOLIC PANEL
ALT: 17 U/L (ref 0–44)
AST: 17 U/L (ref 15–41)
Albumin: 2.9 g/dL — ABNORMAL LOW (ref 3.5–5.0)
Alkaline Phosphatase: 60 U/L (ref 38–126)
Anion gap: 11 (ref 5–15)
BUN: 23 mg/dL — ABNORMAL HIGH (ref 6–20)
CO2: 23 mmol/L (ref 22–32)
Calcium: 8.7 mg/dL — ABNORMAL LOW (ref 8.9–10.3)
Chloride: 105 mmol/L (ref 98–111)
Creatinine, Ser: 2.3 mg/dL — ABNORMAL HIGH (ref 0.61–1.24)
GFR calc Af Amer: 41 mL/min — ABNORMAL LOW (ref >60)
GFR calc non Af Amer: 35 mL/min — ABNORMAL LOW (ref >60)
Glucose, Bld: 94 mg/dL (ref 70–99)
Potassium: 3.6 mmol/L (ref 3.5–5.1)
Sodium: 139 mmol/L (ref 135–145)
Total Bilirubin: 1.1 mg/dL (ref 0.3–1.2)
Total Protein: 6.1 g/dL — ABNORMAL LOW (ref 6.5–8.1)

## 2019-03-20 LAB — GLUCOSE, CAPILLARY
Glucose-Capillary: 86 mg/dL (ref 70–99)
Glucose-Capillary: 95 mg/dL (ref 70–99)

## 2019-03-20 LAB — HEMOGLOBIN A1C
Hgb A1c MFr Bld: 7 % — ABNORMAL HIGH (ref 4.8–5.6)
Mean Plasma Glucose: 154.2 mg/dL

## 2019-03-20 LAB — ECHOCARDIOGRAM COMPLETE
Height: 67 in
Weight: 4320 oz

## 2019-03-20 LAB — CBC WITH DIFFERENTIAL/PLATELET
Abs Immature Granulocytes: 0.02 10*3/uL (ref 0.00–0.07)
Basophils Absolute: 0 10*3/uL (ref 0.0–0.1)
Basophils Relative: 0 %
Eosinophils Absolute: 0.2 10*3/uL (ref 0.0–0.5)
Eosinophils Relative: 3 %
HCT: 42.4 % (ref 39.0–52.0)
Hemoglobin: 13.9 g/dL (ref 13.0–17.0)
Immature Granulocytes: 0 %
Lymphocytes Relative: 38 %
Lymphs Abs: 2.6 10*3/uL (ref 0.7–4.0)
MCH: 27.3 pg (ref 26.0–34.0)
MCHC: 32.8 g/dL (ref 30.0–36.0)
MCV: 83.3 fL (ref 80.0–100.0)
Monocytes Absolute: 0.7 10*3/uL (ref 0.1–1.0)
Monocytes Relative: 10 %
Neutro Abs: 3.3 10*3/uL (ref 1.7–7.7)
Neutrophils Relative %: 49 %
Platelets: 190 10*3/uL (ref 150–400)
RBC: 5.09 MIL/uL (ref 4.22–5.81)
RDW: 15.9 % — ABNORMAL HIGH (ref 11.5–15.5)
WBC: 6.8 10*3/uL (ref 4.0–10.5)
nRBC: 0 % (ref 0.0–0.2)

## 2019-03-20 LAB — LIPID PANEL
Cholesterol: 264 mg/dL — ABNORMAL HIGH (ref 0–200)
HDL: 21 mg/dL — ABNORMAL LOW (ref >40)
LDL Cholesterol: 211 mg/dL — ABNORMAL HIGH (ref 0–99)
Total CHOL/HDL Ratio: 12.6 RATIO
Triglycerides: 162 mg/dL — ABNORMAL HIGH (ref <150)
VLDL: 32 mg/dL (ref 0–40)

## 2019-03-20 LAB — SARS CORONAVIRUS 2 (TAT 6-24 HRS): SARS Coronavirus 2: NEGATIVE

## 2019-03-20 LAB — HIV ANTIBODY (ROUTINE TESTING W REFLEX): HIV Screen 4th Generation wRfx: NONREACTIVE

## 2019-03-20 LAB — TROPONIN I (HIGH SENSITIVITY)
Troponin I (High Sensitivity): 46 ng/L — ABNORMAL HIGH (ref <18)
Troponin I (High Sensitivity): 46 ng/L — ABNORMAL HIGH (ref <18)

## 2019-03-20 IMAGING — MR MR MRA NECK W/O CM
3 series · 37 of 48 positions shown · non-contrast
Comparison: Brain MRI yesterday.

CLINICAL DATA: 38-year-old male with recent dizziness and hearing
loss. Patchy acute right brainstem/cerebellar peduncle infarct on
brain MRI yesterday.

EXAM:
MRA NECK WITHOUT CONTRAST
TECHNIQUE: Angiographic images of the neck were obtained using MRA technique
without intravenous contrast. Carotid stenosis measurements (when
applicable) are obtained utilizing NASCET criteria, using the distal
internal carotid diameter as the denominator.

[Series 7: tof_fl3d_tra_iso · axial · 0.6mm · 0.52mm/px · z∈[-282,-89]mm · 27 of 344 slices shown]
[im 1/344]
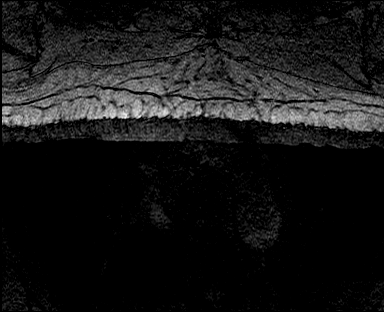
[im 11/344]
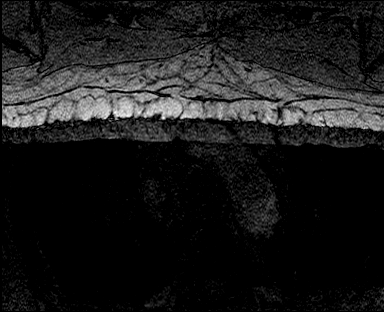
[im 21/344]
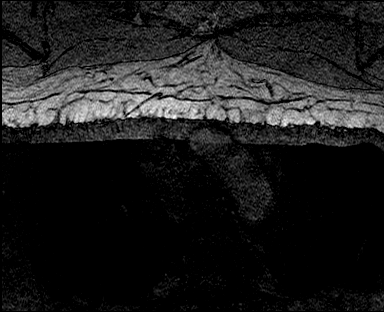
[im 31/344]
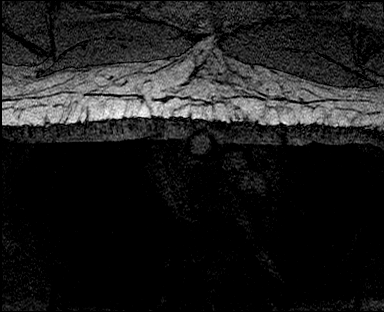
[im 41/344]
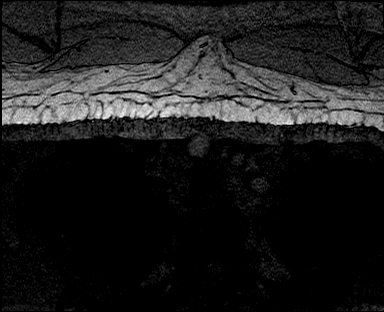
[im 51/344]
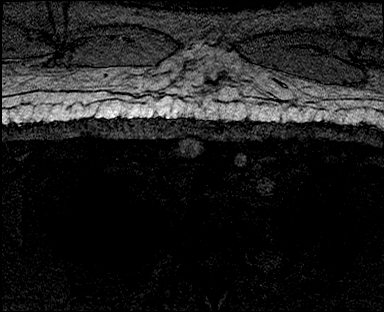
[im 61/344]
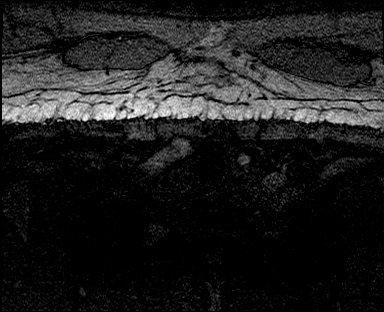
[im 71/344]
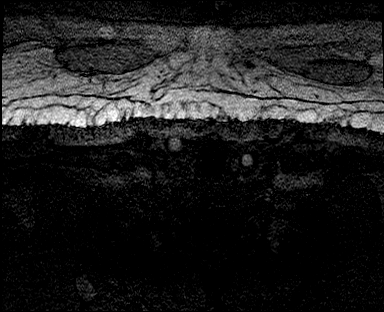
[im 81/344]
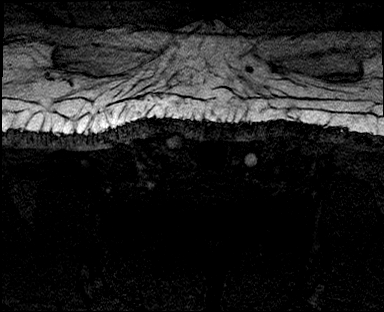
[im 91/344]
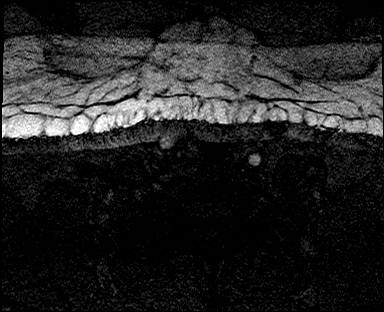
[im 101/344]
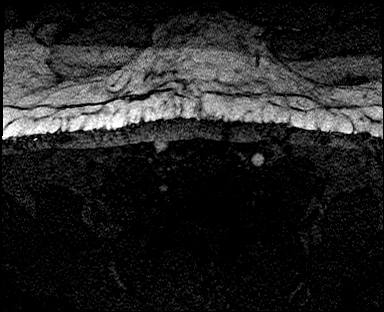
[im 111/344]
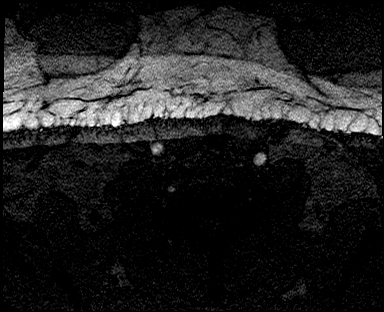
[im 122/344]
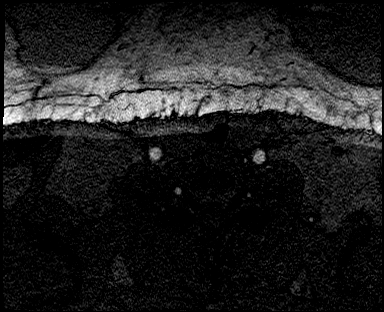
[im 132/344]
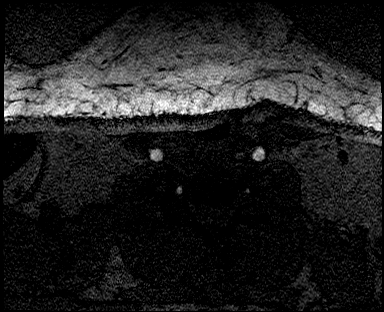
[im 142/344]
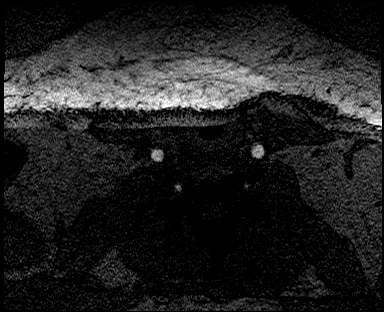
[im 152/344]
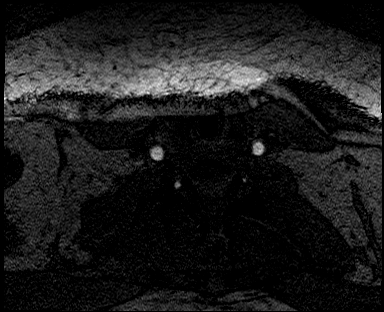
[im 162/344]
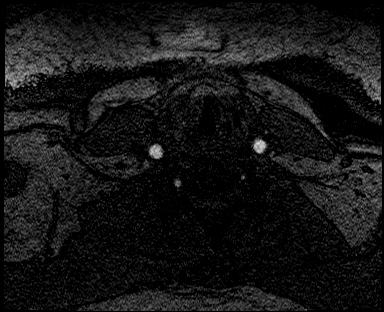
[im 172/344]
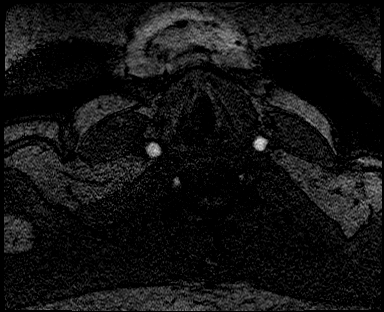
[im 182/344]
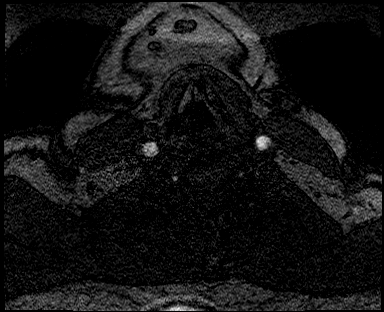
[im 192/344]
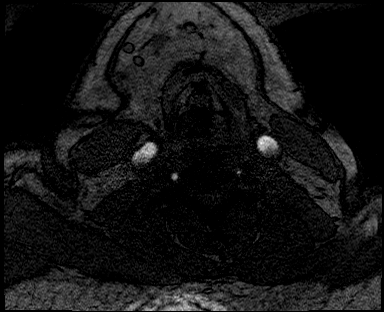
[im 202/344]
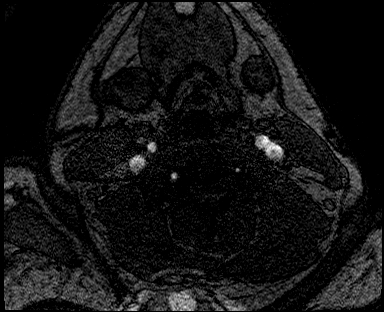
[im 212/344]
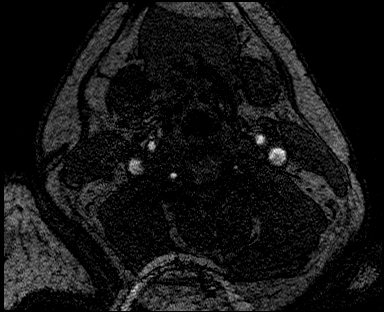
[im 222/344]
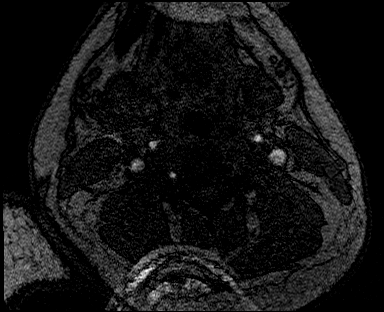
[im 243/344]
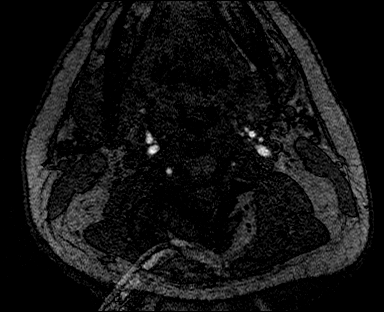
[im 283/344]
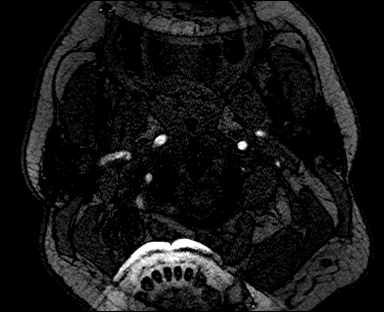
[im 293/344]
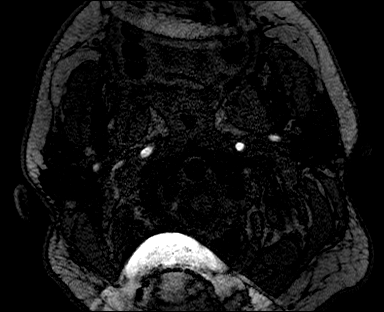
[im 323/344]
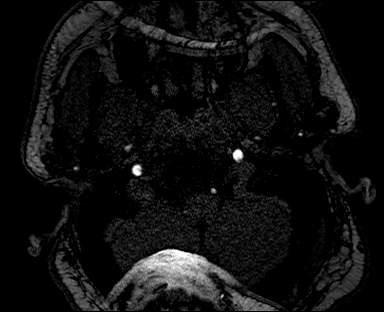

[Series 13: tof_fl2d_tra · axial · 3.0mm · 0.86mm/px · z∈[-287,-68]mm · 9 of 110 slices shown]
[im 1/110]
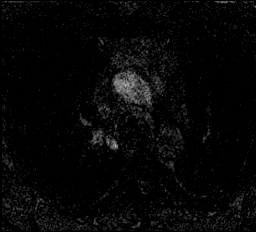
[im 20/110]
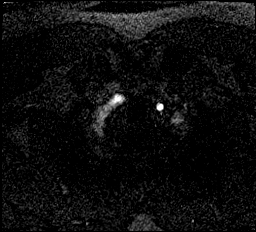
[im 30/110]
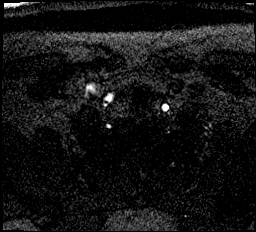
[im 50/110]
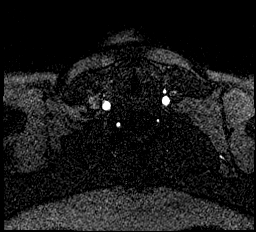
[im 60/110]
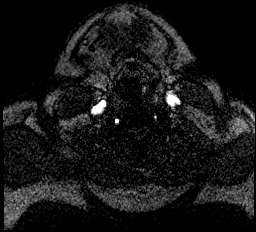
[im 80/110]
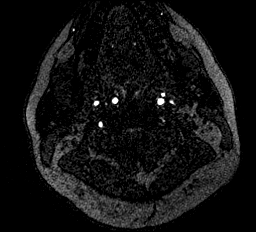
[im 90/110]
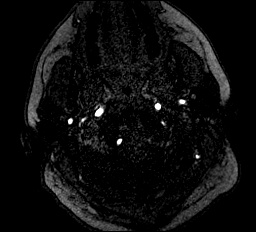
[im 100/110]
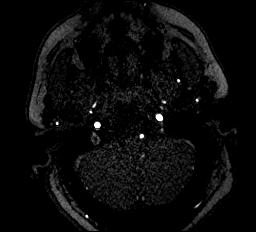
[im 110/110]
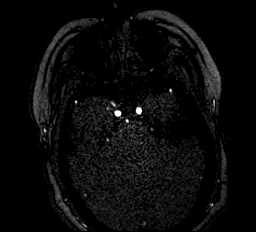

[Series 1015: lt bification rotate · oblique · 0.5mm · 0.10mm/px · 1 of 5 slices shown]
[im 1/5]
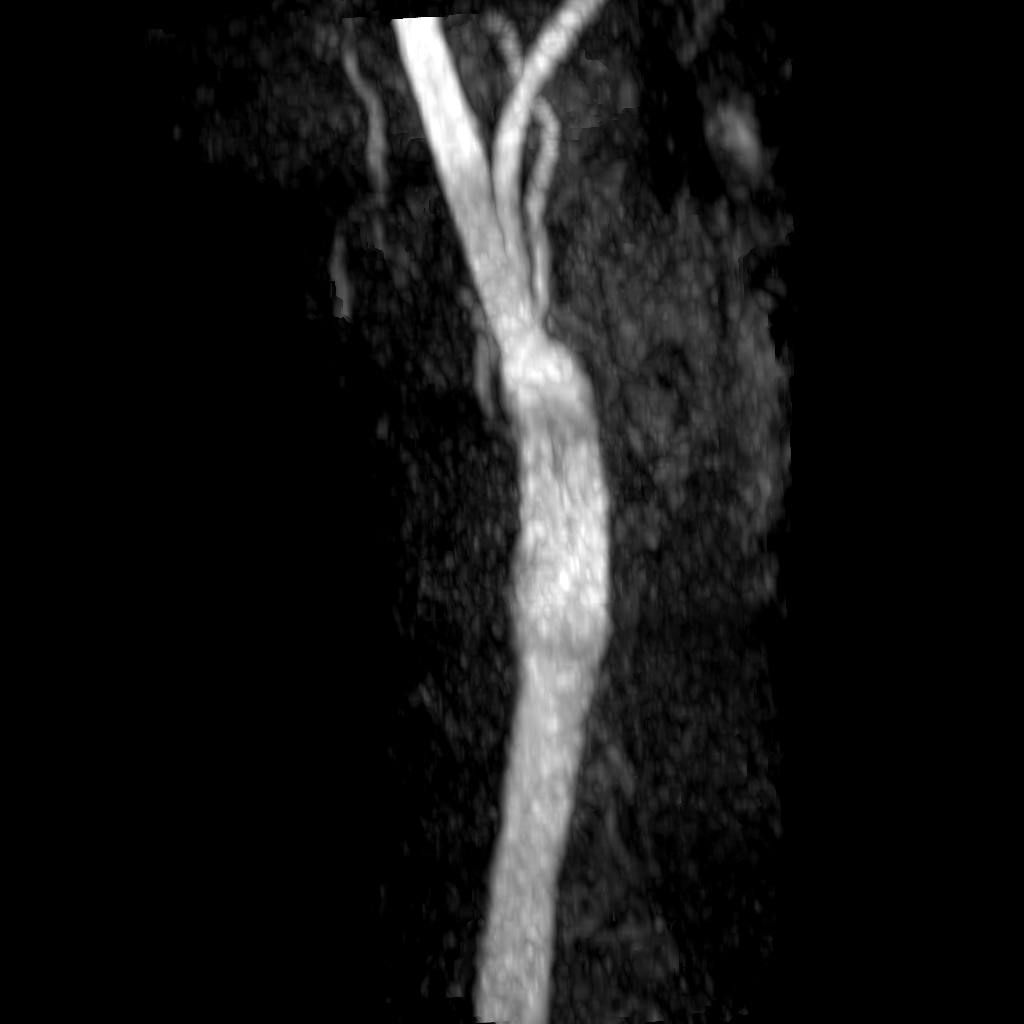

[37 of 48 positions shown; findings below may reference images not displayed]

FINDINGS: Good quality 2D and 3D time-of-flight images.

A 3 vessel arch configuration is evident. There is antegrade flow in
the bilateral cervical carotid arteries. Carotid bifurcations and
cervical ICAs appear within normal limits.

Grossly normal proximal subclavian arteries. Antegrade flow in both
cervical vertebral arteries to the skull base, the right is dominant
and the left is diminutive. No cervical vertebral artery stenosis is
evident.
IMPRESSION: Negative neck MRA.  Dominant right vertebral artery.

## 2019-03-20 IMAGING — MR MR MRA HEAD W/O CM
1 series · 18 of 48 positions shown · non-contrast
Comparison: Brain MRI [DATE] and earlier.

Neck MRA today reported separately.

CLINICAL DATA: 38-year-old male with recent dizziness and hearing
loss. Patchy acute right brainstem/cerebellar peduncle infarct on
brain MRI yesterday.

EXAM:
MRA HEAD WITHOUT CONTRAST
TECHNIQUE: Angiographic images of the Circle of Willis were obtained using MRA
technique without intravenous contrast.

[Series 5: 3d cow · axial · 0.5mm · 0.41mm/px · z∈[-84,+12]mm · 18 of 208 slices shown]
[im 1/208]
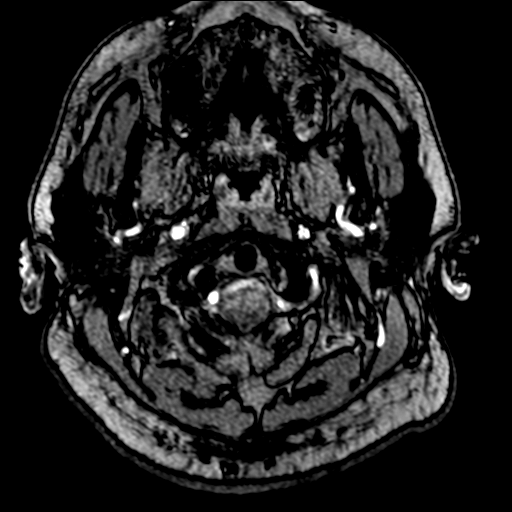
[im 5/208]
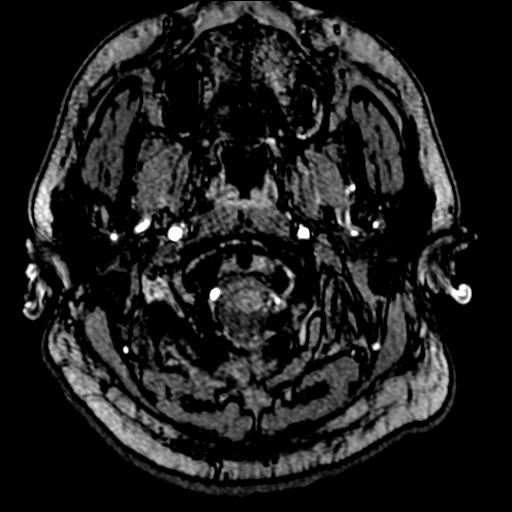
[im 9/208]
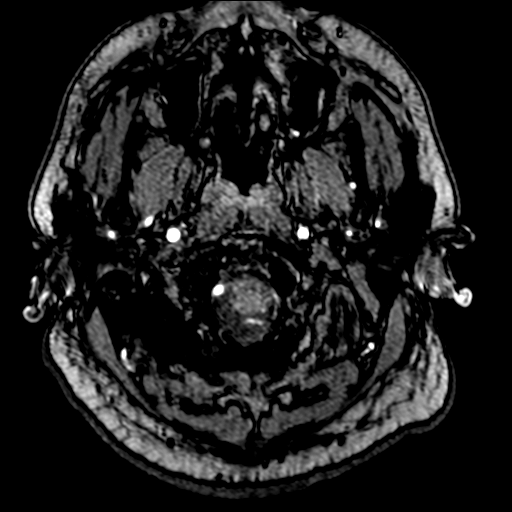
[im 14/208]
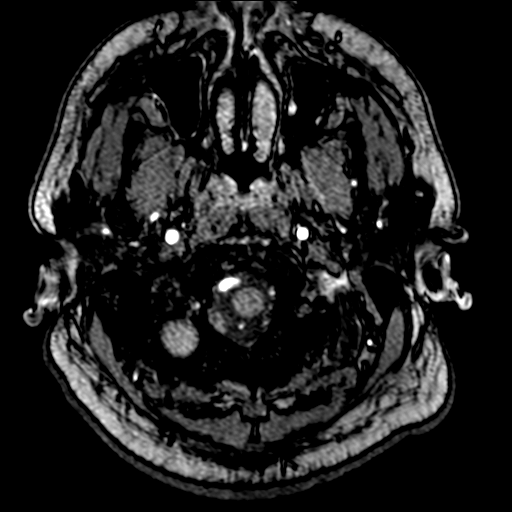
[im 18/208]
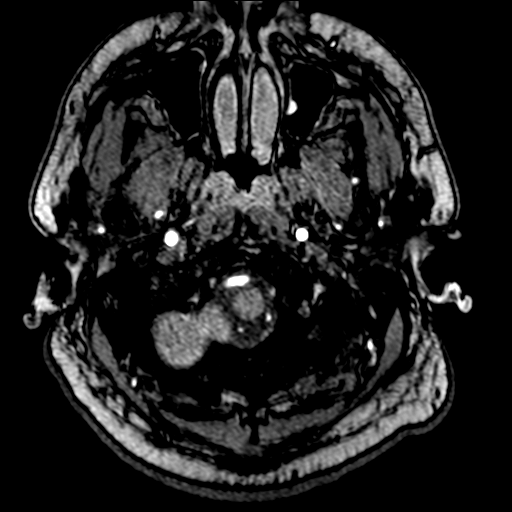
[im 23/208]
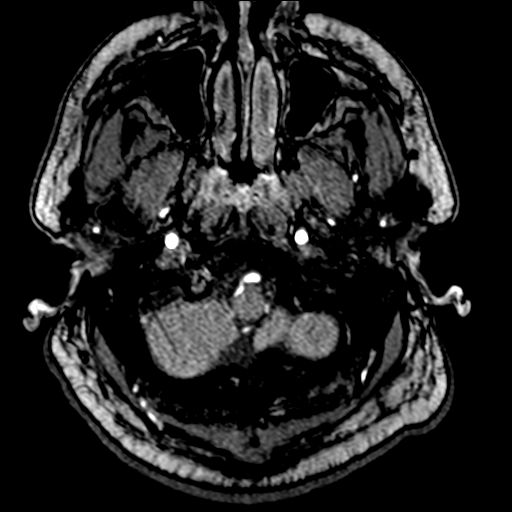
[im 27/208]
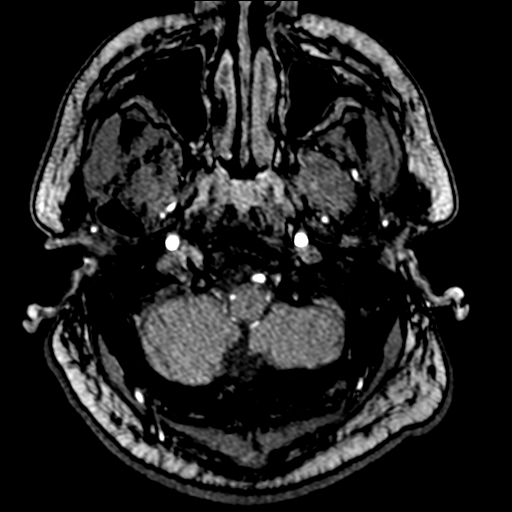
[im 31/208]
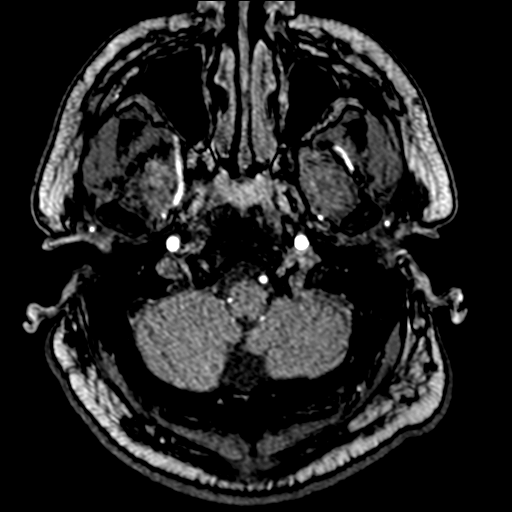
[im 36/208]
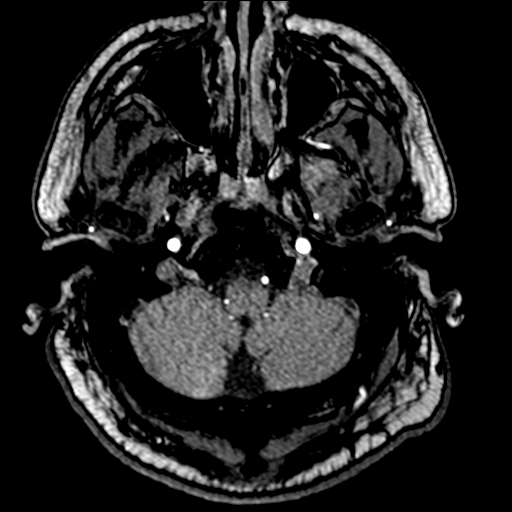
[im 40/208]
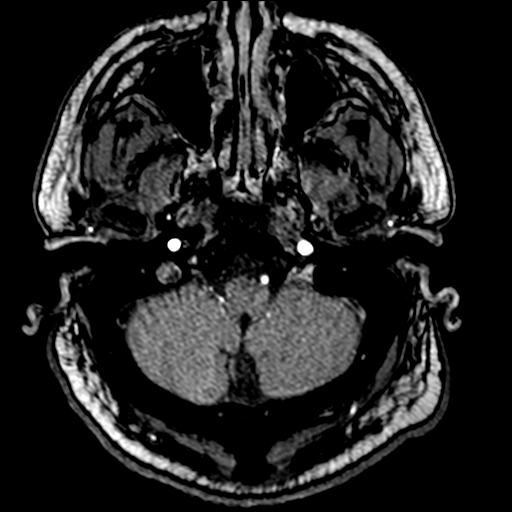
[im 67/208]
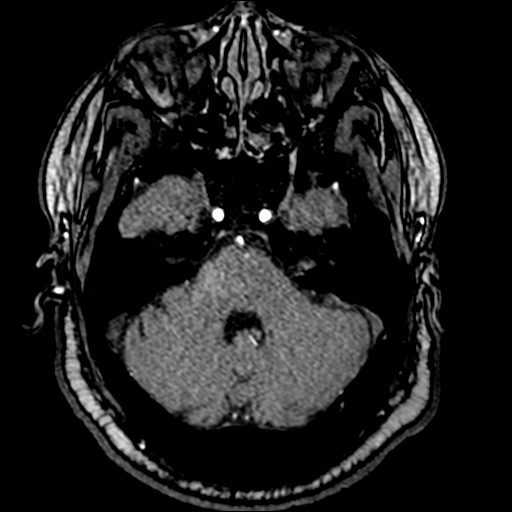
[im 93/208]
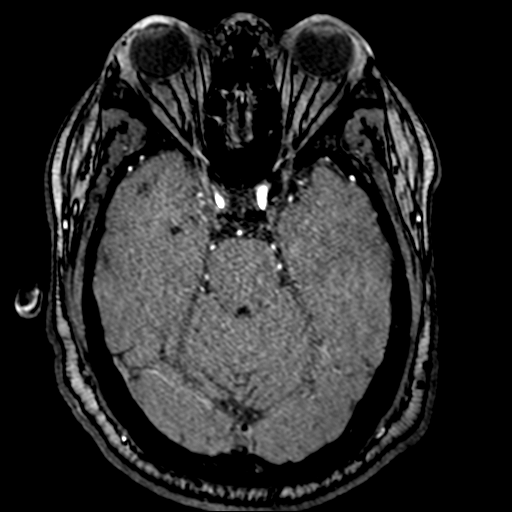
[im 106/208]
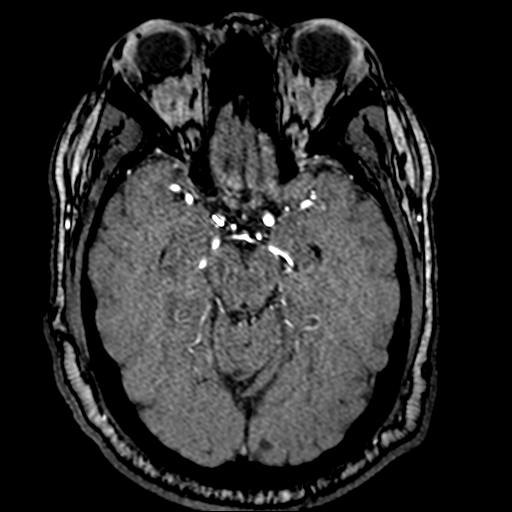
[im 119/208]
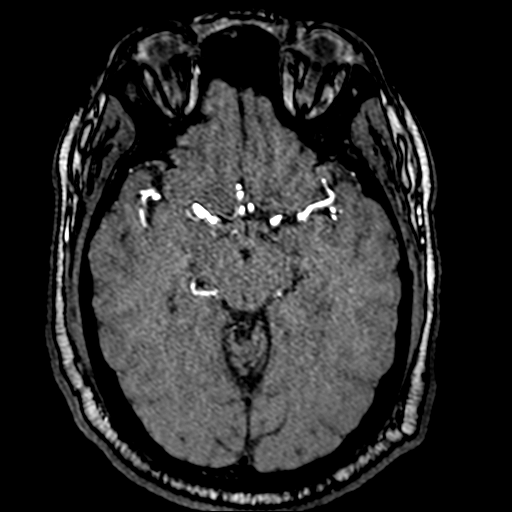
[im 146/208]
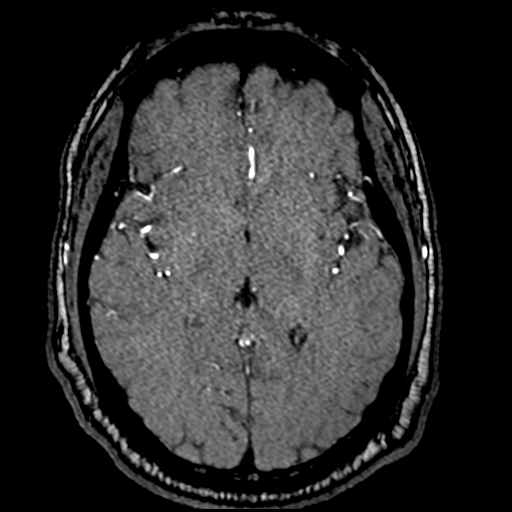
[im 172/208]
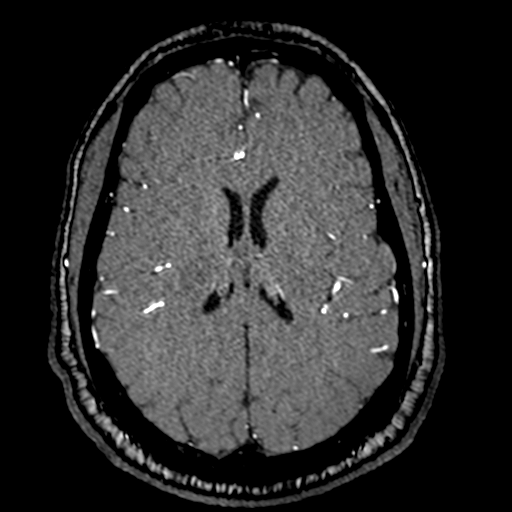
[im 177/208]
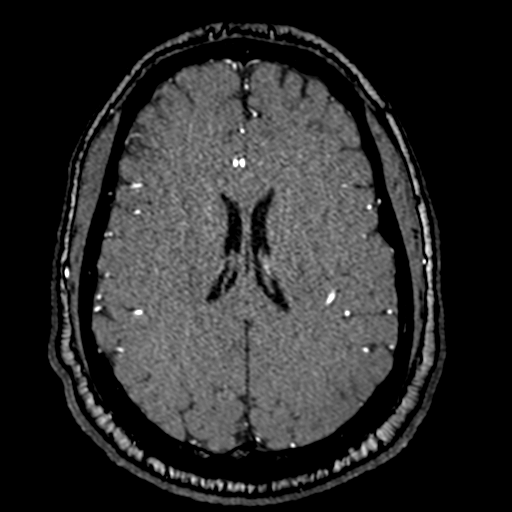
[im 199/208]
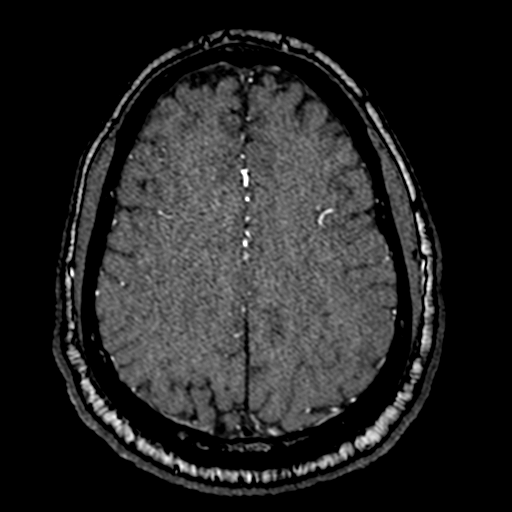

[18 of 48 positions shown; findings below may reference images not displayed]

FINDINGS: Antegrade flow in the posterior circulation with dominant right
vertebral artery. The non dominant left vertebral artery appears to
functionally terminates in PICA which is patent. The right PICA
origin is patent on series 5, image 21. There is mild tapering of
the right vertebrobasilar junction, but no significant distal
vertebral or proximal basilar stenosis. The left AICA origin is
patent although no right AICA flow is identified. The basilar artery
remains diminutive and mildly irregular and tapers to the basilar
tip. Patent SCA origins. Fetal type bilateral PCA origins. Right PCA
branches appear within normal limits. Possible high-grade stenosis
of the left PCA distal P2 or proximal P3 segment versus artifact
(series [FX], image 16). There is distal left PCA flow signal
preserved.

Antegrade flow in both ICA siphons. Mild supraclinoid right siphon
irregularity and stenosis suspected (series [FX] image 4). No left
siphon stenosis. Ophthalmic and posterior communicating artery
origins are normal. Patent carotid termini. Patent MCA and ACA
origins.

Questionable mild irregularity at the right MCA and ACA origins.
Left ACA, anterior communicating artery and visible ACA branches are
within normal limits. Left MCA M1 segment is mildly tortuous and
bifurcates early without stenosis. Similar early right MCA
bifurcation without stenosis. Visible bilateral MCA branches appear
patent with mild irregularity.
IMPRESSION: 1. No large vessel occlusion. Possible occlusion of the right AICA,
or it could be diminutive or absent.

2. Severe stenosis versus artifact in the left PCA distal
P2/proximal P3 segment.

3. Suggestion of intracranial atherosclerosis involving the anterior
and posterior circulation, including mild irregularity of the:
distal right vertebral artery, basilar artery, supraclinoid right
ICA, and bilateral MCA branches.

4. Non dominant left vertebral artery functionally terminates in
PICA. Fetal type bilateral PCA origins.

## 2019-03-20 MED ORDER — CARVEDILOL 3.125 MG PO TABS: 3.1250 mg | ORAL_TABLET | Freq: Two times a day (BID) | ORAL | Status: DC

## 2019-03-20 MED ORDER — CLOPIDOGREL BISULFATE 75 MG PO TABS
300.0000 mg | ORAL_TABLET | Freq: Once | ORAL | Status: AC
  Administered 2019-03-20: 300 mg via ORAL
  Filled 2019-03-20: qty 4

## 2019-03-20 MED ORDER — CLOPIDOGREL BISULFATE 75 MG PO TABS
75.0000 mg | ORAL_TABLET | Freq: Every day | ORAL | Status: DC
  Administered 2019-03-21 – 2019-03-23 (×3): 75 mg via ORAL
  Filled 2019-03-20 (×3): qty 1

## 2019-03-20 NOTE — Progress Notes (Signed)
Rehab Admissions Coordinator Note:  Per PT and OT recommendation, this patient was screened by Cheri Rous for appropriateness for an Inpatient Acute Rehab Consult.  At this time, we are recommending Inpatient Rehab consult.  AC will place consult order per protocol.   Cheri Rous 03/20/2019, 3:33 PM  I can be reached at [phone].

## 2019-03-20 NOTE — Evaluation (Signed)
Physical Therapy Evaluation Patient Details Name: Joel [name] MRN: [id number] DOB: 06/25/1980 Today's Date: 03/20/2019   History of Present Illness  39 yo male with onset of dizziness and LOB was admitted, had R facial numbness, mult falls, ataxia, dizziness.  Dx with acute infarct along R cerebellar peduncle.  Has acute kidney disease, intracranial atherosclerosis and HLD.  PMHx:  atheroslcerosis, anxiety, CHF, DM, obesity, stroke, EF 45-50%, sleep apnea, HTN, CKD3,   Clinical Impression  Pt is a good candidate for CIR with his ataxic gait, RLE weakness and his difficulty with vision from recent stroke changes.  Pt is limited by R eye blurriness and control of eye movements.  Follow acutely for strengthening, balance training and work with gait with close guard given his dizziness.  Had controlled pulses of 92 pre and 90 post gait, O2 sats were 100% pre and 99% post gait.      Follow Up Recommendations CIR    Equipment Recommendations  None recommended by PT(defer to CIR )    Recommendations for Other Services Rehab consult     Precautions / Restrictions Precautions Precautions: Fall Precaution Comments: dizzy upon standing and at times sitting Restrictions Weight Bearing Restrictions: No      Mobility  Bed Mobility Overal bed mobility: Needs Assistance Bed Mobility: Rolling;Sidelying to Sit;Sit to Supine Rolling: Supervision Sidelying to sit: Min guard Supine to sit: Min assist Sit to supine: Min guard   General bed mobility comments: pt is very guarded with movement   Transfers Overall transfer level: Needs assistance Equipment used: Rolling walker (2 wheeled) Transfers: Sit to/from [employer] Sit to Stand: Min assist Stand pivot transfers: Mod assist       General transfer comment: assist for balance   Ambulation/Gait Ambulation/Gait assistance: Min assist Gait Distance (Feet): 25 Feet(Sat three times on this trip) Assistive device: Rolling  walker (2 wheeled);1 person hand held assist Gait Pattern/deviations: Step-to pattern;Decreased stride length;Wide base of support;Drifts right/left;Trunk flexed Gait velocity: reduced Gait velocity interpretation: <1.31 ft/sec, indicative of household ambulator General Gait Details: pt becomes dizzy and cannot control his balance, sitting often and unsafe, unable to focus on his limitations to determine his limits  Stairs            Wheelchair Mobility    Modified Rankin (Stroke Patients Only) Modified Rankin (Stroke Patients Only) Pre-Morbid Rankin Score: No symptoms Modified Rankin: Moderately severe disability     Balance Overall balance assessment: Needs assistance Sitting-balance support: Feet supported Sitting balance-Leahy Scale: Fair Sitting balance - Comments: able to maintain static sitting with supervision  Postural control: Right lateral lean Standing balance support: Bilateral upper extremity supported Standing balance-Leahy Scale: Poor Standing balance comment: requires bil. UE support                              Pertinent Vitals/Pain Pain Assessment: Faces Faces Pain Scale: Hurts even more Pain Location: headache and eye pain  Pain Descriptors / Indicators: Grimacing;Aching;Headache Pain Intervention(s): Monitored during session    Home Living Family/patient expects to be discharged to:: Private residence Living Arrangements: Spouse/significant other Available Help at Discharge: Family;Available 24 hours/day Type of Home: House Home Access: Stairs to enter Entrance Stairs-Rails: Can reach both Entrance Stairs-Number of Steps: 5 Home Layout: One level Home Equipment: Shower seat - built in Additional Comments: despite recent falls has not used an AD    Prior Function Level of Independence: Independent  Comments: Pt does not work, but drives, enjoys riding his motorcycle, Since onset of symptoms, pt has required assist with  donning/doffing socks and shoes, has had several falls, and has significantly limited his activity due to severity of dizziness       Hand Dominance   Dominant Hand: Right    Extremity/Trunk Assessment   Upper Extremity Assessment Upper Extremity Assessment: RUE deficits/detail RUE Deficits / Details: ataxia noted  RUE Sensation: decreased proprioception RUE Coordination: decreased fine motor;decreased gross motor    Lower Extremity Assessment Lower Extremity Assessment: Defer to PT evaluation RLE Deficits / Details: moderate incoordination with heel on shin and RAM RLE Coordination: decreased gross motor    Cervical / Trunk Assessment Cervical / Trunk Assessment: Normal  Communication   Communication: No difficulties  Cognition Arousal/Alertness: Awake/alert Behavior During Therapy: Flat affect Overall Cognitive Status: Within Functional Limits for tasks assessed                                 General Comments: grossly assessed.  Pt is very guarded due to dizziness and pain resulting in limited interactions       General Comments General comments (skin integrity, edema, etc.): Wife present.  Pt and wife instructed in gaze fixation with mobility.  Targets were posted around room for him to fixate gaze when moving in hopes of decreasing dizziness and improving activity tolerance.     Exercises     Assessment/Plan    PT Assessment Patient needs continued PT services  PT Problem List Decreased strength;Decreased activity tolerance;Decreased balance;Decreased mobility;Decreased coordination;Decreased knowledge of use of DME;Decreased safety awareness;Cardiopulmonary status limiting activity;Obesity;Pain;Impaired sensation       PT Treatment Interventions DME instruction;Gait training;Stair training;Functional mobility training;Therapeutic activities;Therapeutic exercise;Balance training;Neuromuscular re-education;Patient/family education    PT Goals  (Current goals can be found in the Care Plan section)  Acute Rehab PT Goals Patient Stated Goal: to be able to ride motorcycle  PT Goal Formulation: With patient Time For Goal Achievement: 04/03/19 Potential to Achieve Goals: Good    Frequency Min 3X/week   Barriers to discharge Inaccessible home environment;Decreased caregiver support home with stairs to enter and with wife to assist    Co-evaluation               AM-PAC PT "6 Clicks" Mobility  Outcome Measure Help needed turning from your back to your side while in a flat bed without using bedrails?: A Little Help needed moving from lying on your back to sitting on the side of a flat bed without using bedrails?: A Little Help needed moving to and from a bed to a chair (including a wheelchair)?: A Little Help needed standing up from a chair using your arms (e.g., wheelchair or bedside chair)?: A Little Help needed to walk in hospital room?: A Little Help needed climbing 3-5 steps with a railing? : Total 6 Click Score: 16    End of Session Equipment Utilized During Treatment: Gait belt Activity Tolerance: Treatment limited secondary to medical complications (Comment);Patient limited by fatigue Patient left: in bed;with call bell/phone within reach;with bed alarm set;Other (comment)(echo staff member in as PT finished) Nurse Communication: Mobility status;Other (comment)(plan for rehab) PT Visit Diagnosis: Unsteadiness on feet (R26.81);Muscle weakness (generalized) (M62.81);Ataxic gait (R26.0);Difficulty in walking, not elsewhere classified (R26.2);Other symptoms and signs involving the nervous system (R29.898);Dizziness and giddiness (R42);Hemiplegia and hemiparesis Hemiplegia - Right/Left: Right Hemiplegia - dominant/non-dominant: Dominant Hemiplegia - caused by:  Cerebral infarction    Time: 7841-2820 PT Time Calculation (min) (ACUTE ONLY): 33 min   Charges:   PT Evaluation $PT Eval Moderate Complexity: 1 Mod PT  Treatments $Gait Training: 8-22 mins       Ramond Dial 03/20/2019, 1:26 PM   Mee Hives, PT MS Acute Rehab Dept. Number: [place] and [place]

## 2019-03-20 NOTE — Progress Notes (Signed)
*  PRELIMINARY RESULTS* Echocardiogram 2D Echocardiogram has been performed.  Joel [name] 03/20/2019, 10:59 AM

## 2019-03-20 NOTE — Progress Notes (Signed)
STROKE TEAM PROGRESS NOTE   HISTORY OF PRESENT ILLNESS (per record) Joel [name] is a 39 y.o. male with history of diabetes, hypertension, hyperlipidemia, CHF who presents with dizziness that has been going on for 3 to 4 weeks, as well as facial numbness that started on the past few days. He also noted that his hearing went out in his right ear when this initially started. He sought care and was told that it was likely due to ear problem due to the hearing going on in that ear. He had significant nausea and vomiting at that time, but that has since improved.  He has had gait abnormality since that time and this is not improving. Over the past few days, he noticed some facial numbness which prompted to seek care again today for reevaluation at this time he had an MRI which demonstrates an AICA territory infarct. LKW: 3 weeks ago tpa given?: no, out of window   INTERVAL HISTORY  I personally reviewed history of presenting illness with the patient in detail, electronic medical records and imaging films in PACS.  He states his symptoms began suddenly on 02/19/2019 with sudden onset of dizziness and right-sided hearing difficulties.  He saw his primary care physician initially thought to have labyrinthitis and was treated with prednisone and Lasix symptoms did not get better.  He was referred to ENT but continued to get worse before he could be seen and now a few days ago developed right sided face and tongue numbness.  MRI scan of the brain shows what looks like a subacute right anterior inferior cerebellar artery infarct.  MRA of the neck and brain without contrast are both unremarkable.  LDL cholesterol is elevated at 2 1 1  mg percent and hemoglobin A1c at 7.0.  Urine drug screen is negative.  Echocardiogram is pending.    OBJECTIVE Vitals:   03/19/19 2105 03/19/19 2200 03/20/19 0100 03/20/19 0300  BP: 125/78 109/75 102/62 110/81  Pulse: 76 78 86 87  Resp: 19 18 18 18   Temp: 97.6 F (36.4 C) 97.7  F (36.5 C) 97.6 F (36.4 C) 97.6 F (36.4 C)  TempSrc: Oral Oral Oral Oral  SpO2: 97% 96% 94% 94%  Weight:      Height:        CBC:  Recent Labs  Lab 03/19/19 1521 03/20/19 0344  WBC 6.3 6.8  NEUTROABS 4.1 3.3  HGB 15.1 13.9  HCT 48.0 42.4  MCV 85.6 83.3  PLT 227 190    Basic Metabolic Panel:  Recent Labs  Lab 03/19/19 1521 03/20/19 0344  NA 137 139  K 4.0 3.6  CL 101 105  CO2 23 23  GLUCOSE 214* 94  BUN 22* 23*  CREATININE 2.63* 2.30*  CALCIUM 9.1 8.7*    Lipid Panel:     Component Value Date/Time   CHOL 264 (H) 03/20/2019 0113   CHOL 361 (H) 06/11/2014 1232   TRIG 162 (H) 03/20/2019 0113   TRIG 246 (H) 06/11/2014 1232   HDL 21 (L) 03/20/2019 0113   HDL 41 06/11/2014 1232   CHOLHDL 12.6 03/20/2019 0113   VLDL 32 03/20/2019 0113   VLDL 49 (H) 06/11/2014 1232   LDLCALC 211 (H) 03/20/2019 0113   LDLCALC 271 (H) 06/11/2014 1232   HgbA1c:  Lab Results  Component Value Date   HGBA1C 7.0 (H) 03/20/2019   Urine Drug Screen:     Component Value Date/Time   LABOPIA NONE DETECTED 03/19/2019 1940   COCAINSCRNUR NONE DETECTED  03/19/2019 1940   COCAINSCRNUR NONE DETECTED 02/12/2017 1632   LABBENZ POSITIVE (A) 03/19/2019 1940   AMPHETMU NONE DETECTED 03/19/2019 1940   THCU NONE DETECTED 03/19/2019 1940   LABBARB NONE DETECTED 03/19/2019 1940    Alcohol Level     Component Value Date/Time   ETH <10 03/19/2019 1944    IMAGING  MR ANGIO HEAD WO CONTRAST 03/20/2019 IMPRESSION:  1. No large vessel occlusion. Possible occlusion of the right AICA, or it could be diminutive or absent.  2. Severe stenosis versus artifact in the left PCA distal P2/proximal P3 segment.  3. Suggestion of intracranial atherosclerosis involving the anterior and posterior circulation, including mild irregularity of the: distal right vertebral artery, basilar artery, supraclinoid right ICA, and bilateral MCA branches.  4. Non dominant left vertebral artery functionally terminates  in PICA. Fetal type bilateral PCA origins.   MR ANGIO NECK WO CONTRAST 03/20/2019 IMPRESSION:  Negative neck MRA. Dominant right vertebral artery.   MR BRAIN/IAC W WO CONTRAST 03/19/2019 IMPRESSION:  Small acute infarction involving the right middle cerebellar peduncle. Corresponding enhancement may reflect early blood brain barrier disruption. Given presence of vascular risk factors, ischemia is favored over demyelination. There is also a probable small chronic infarct near the right frontal horn.   Transthoracic Echocardiogram  00/00/2021 Pending   ECG - SR rate 76 BPM. (See cardiology reading for complete details)   PHYSICAL EXAM Blood pressure 110/81, pulse 87, temperature 97.6 F (36.4 C), temperature source Oral, resp. rate 18, height 5\' 7"  (1.702 m), weight 122.5 kg, SpO2 94 %. Obese young African-American male not in distress. . Afebrile. Head is nontraumatic. Neck is supple without bruit.    Cardiac exam no murmur or gallop. Lungs are clear to auscultation. Distal pulses are well felt.  Neurological Exam :  He is awake alert oriented to time place and person.  Speech and language appear normal.  Extraocular movements are full range without nystagmus but he has marked saccadic dysmetria on lateral gaze bilaterally.  He has significant decreased hearing in the right ear and normal hearing on the left.  He has mild weakness of the right lower greater than upper face.  There is subjective diminished touch pinprick sensation in the right face over the cheeks and jaw.  Tongue is midline.  Motor system exam shows mild right-sided weakness involving hand grip, intrinsic hand muscles and right hip flexors and ankle dorsiflexors.  There is mild right finger-to-nose and knee to heel dysmetria. Gait demonstrates ataxia with dragging of the right leg   ASSESSMENT/PLAN Joel [name] is a 39 y.o. male with history of diabetes, hypertension, hyperlipidemia, EF 45-50%, CHF and hx of medical  non compliance, presenting with dizziness, gait instability, loss of hearing R ear, facial numbness, nausea and vomiting. He did not receive IV t-PA due to late presentation (>4.5 hours from time of onset)  Stroke: Small acute infarction involving the right middle cerebellar peduncle likely due to small vessel disease.  Resultant right hemiataxia, mild right hemiparesis, right-sided hearing loss and right face numbness  Code Stroke CT Head - not ordered  CT head - not ordered  MRI head W&WO - Small acute infarction involving the right middle cerebellar peduncle. Corresponding enhancement may reflect early blood brain barrier disruption. Given presence of vascular risk factors, ischemia is favored over demyelination. There is also a probable small chronic infarct near the right frontal horn.   MRA head - Possible occlusion of the right AICA, or it could be  diminutive or absent. Severe stenosis versus artifact in the left PCA distal P2/proximal P3 segment. Suggestion of intracranial atherosclerosis involving the anterior and posterior circulation, including mild irregularity of the: distal right vertebral artery, basilar artery, supraclinoid right ICA, and bilateral MCA branches.   CTA H&N - not ordered  CT Perfusion - not ordered  Transcranial dopplers - pending  Carotid Doppler - pending  2D Echo - pending  Sars Corona Virus 2 - negative  LDL - 211  HgbA1c - 7.0  UDS - benzodiazepine  VTE prophylaxis - Lovenox Diet  Diet Order            Diet heart healthy/carb modified Room service appropriate? Yes; Fluid consistency: Thin; Fluid restriction: 1200 mL Fluid  Diet effective now              aspirin 81 mg daily prior to admission, now on aspirin 325 mg daily and clopidogrel 75 mg daily  Patient counseled to be compliant with his antithrombotic medications  Ongoing aggressive stroke risk factor management  Therapy recommendations:  pending  Disposition:   Pending  Hypertension  Home BP meds: Norvasc, Entresto, Coreg  Current BP meds: none   BP mildly low . Permissive hypertension (OK if < 220/120) but gradually normalize in 5-7 days  . Long-term BP goal normotensive  Hyperlipidemia  Home Lipid lowering medication: Lipitor 80 mg daily  LDL 211, goal < 70  Current lipid lowering medication: Lipitor 80 mg daily   Continue statin at discharge  Diabetes  Home diabetic meds: insulin and Jardiance  Current diabetic meds: insulin  HgbA1c 7.0, goal < 7.0  Other Stroke Risk Factors  Previous ETOH use  Obesity, Body mass index is 42.29 kg/m., recommend weight loss, diet and exercise as appropriate   Family hx stroke (father)  Congestive Heart Failure  Other Active Problems  Code status - Full code  CKD - stage 3b   Hospital day # 1  I have personally obtained history,examined this patient, reviewed notes, independently viewed imaging studies, participated in medical decision making and plan of care.ROS completed by me personally and pertinent positives fully documented  I have made any additions or clarifications directly to the above note.Marland Kitchen He presented with 3-week history of dizziness, right-sided hearing loss and more recent right face numbness likely due to subacute right pontine infarct etiology to be determined but despite his young age he does have multiple vascular risk factors in the form of obesity, diabetes, hyperlipidemia, obstructive sleep apnea.  Recommend dual antiplatelet therapy with aspirin and Plavix for 3 weeks followed by Plavix alone.  Continue aggressive risk factor modification.  Check ANA panel, hypercoagulable labs, transcranial Doppler bubble study for PFO.  Physical occupational therapy and he will likely need inpatient rehab.  Discussed with Dr. Lynden Oxford.  Greater than 50% time during this 35-minute visit was spent in counseling and coordination of care about his stroke and answering  questions Delia Heady, MD Medical Director [facility] Pager: [phone] 03/20/2019 12:06 PM   To contact Stroke Continuity provider, please refer to [email or web address]. After hours, contact General Neurology

## 2019-03-20 NOTE — Progress Notes (Signed)
[facility] Progress Note  Patient: Joel [name]    DJT:[id number]  DOA: 03/19/2019     Date of Service: the patient was seen and examined on 03/20/2019  Chief Complaint  Patient presents with  . Numbness   Brief hospital course: Joel [name] is a 39 y.o. male with history of nonischemic cardiomyopathy last EF measured was 30% with grade 2 diastolic dysfunction in 2019 with history of diabetes mellitus type 1, chronic disease stage III hypertension had started experiencing right facial numbness and difficulty getting on the right side for almost 3 weeks now with initial having some vomiting.  Patient was treated for vestibular problems and referred to the ENT who referred patient to the ER given the concern for possible stroke.  Patient during this time also started having ataxia.  Also had some visual blurriness.  Denies any headache fever chills.  ED Course: In the ER patient was found to have mild ataxia.  And was able to move all extremities 5 x 5.  MRI brain shows acute infarct along the right cerebellar peduncle.  Neurologist on-call was consulted.  Patient passed swallow.  Was placed on aspirin Plavix and admitted for further stroke work-up.  EKG shows normal sinus rhythm with IVCD.  Labs show worsening renal function with creatinine of 2.6 baseline is usually around 2.  Was given to 30 cc normal saline bolus.  Covid test was negative. Currently further plan is for the stroke work-up.  Assessment and Plan: 1.  Acute right middle cerebellar CVA After the window.  For TPA. Appreciate neurological assistance. Ischemia is favored over demyelination. MRA concerning for occlusion of the right AICA. Transcranial Dopplers, bubble study, carotid Doppler and 2D echo currently pending. LDL 211 patient will be on Lipitor 80 mg. Hemoglobin A1c 7.0. Passed swallowing evaluation and currently on cardiac diet. PT OT and SLP following. Current recommendation is CIR. Consulted. Plan is to  continue aspirin and Plavix.  2.  Essential hypertension Permissive hypertension for now. Currently holding blood pressure medication.  3.  Severe hyperlipidemia LDL 211. Currently on Lipitor 80 mg daily.  4.  Type 2 diabetes mellitus, uncontrolled with peripheral vascular disease Hemoglobin A1c 7.0. At home on insulin and Jardiance. Currently on sliding scale insulin. Monitor.  5.  Recently diagnosed OSA Patient remains compliant with his CPAP.  6.  Morbid obesity Body mass index is 42.29 kg/m.  Outpatient dietary consultation will be beneficial.  7.  Acute on chronic kidney disease stage IIIb. Patient was given IV fluids per Currently holding antihypertensive medication. Monitor.  Diet: Cardiac and carb modified DVT Prophylaxis: Subcutaneous Heparin    Advance goals of care discussion: Full code  Family Communication: family was present at bedside, at the time of interview.  The pt provided permission to discuss medical plan with the family. Opportunity was given to ask question and all questions were answered satisfactorily.   Disposition:  Pt is from home, admitted with acute CVA, still has acute CVA with pending work-up, which precludes a safe discharge. Discharge to CIR, when medically ready.  Subjective: Continues to have dizziness and lightheadedness.  Right ear pain.  No nausea or vomiting.  Physical Exam: General:  alert oriented to time, place, and person.  Appear in mild distress, affect appropriate Eyes: PERRL ENT: Oral Mucosa Clear, moist  Neck: no JVD,  Cardiovascular: S1 and S2 Present, no Murmur,  Respiratory: good respiratory effort, Bilateral Air entry equal and Decreased, no Crackles, no wheezes Abdomen: Bowel Sound present,  Soft and no tenderness,  Skin: no rash Extremities: no Pedal edema, no calf tenderness Neurologic: Difficulty hearing of the right side Gait not checked due to patient safety concerns  Vitals:   03/20/19 0100 03/20/19  0300 03/20/19 1130 03/20/19 1537  BP: 102/62 110/81 106/64 102/67  Pulse: 86 87 81 87  Resp: 18 18 16 20   Temp: 97.6 F (36.4 C) 97.6 F (36.4 C) 98 F (36.7 C) 98.2 F (36.8 C)  TempSrc: Oral Oral Oral Axillary  SpO2: 94% 94% 97% 96%  Weight:      Height:        Intake/Output Summary (Last 24 hours) at 03/20/2019 1622 Last data filed at 03/20/2019 1537 Gross per 24 hour  Intake 490 ml  Output --  Net 490 ml   Filed Weights   03/19/19 1514  Weight: 122.5 kg    Data Reviewed: I have personally reviewed and interpreted daily labs, tele strips, imagings as discussed above. I reviewed all nursing notes, pharmacy notes, vitals, pertinent old records I have discussed plan of care as described above with RN and patient/family.  CBC: Recent Labs  Lab 03/19/19 1521 03/20/19 0344  WBC 6.3 6.8  NEUTROABS 4.1 3.3  HGB 15.1 13.9  HCT 48.0 42.4  MCV 85.6 83.3  PLT 227 190   Basic Metabolic Panel: Recent Labs  Lab 03/19/19 1521 03/20/19 0344  NA 137 139  K 4.0 3.6  CL 101 105  CO2 23 23  GLUCOSE 214* 94  BUN 22* 23*  CREATININE 2.63* 2.30*  CALCIUM 9.1 8.7*    Studies: MR ANGIO HEAD WO CONTRAST  Result Date: 03/20/2019 CLINICAL DATA:  39 year old male with recent dizziness and hearing loss. Patchy acute right brainstem/cerebellar peduncle infarct on brain MRI yesterday. EXAM: MRA HEAD WITHOUT CONTRAST TECHNIQUE: Angiographic images of the Circle of Willis were obtained using MRA technique without intravenous contrast. COMPARISON:  Brain MRI 03/19/2019 and earlier. Neck MRA today reported separately. FINDINGS: Antegrade flow in the posterior circulation with dominant right vertebral artery. The non dominant left vertebral artery appears to functionally terminates in PICA which is patent. The right PICA origin is patent on series 5, image 21. There is mild tapering of the right vertebrobasilar junction, but no significant distal vertebral or proximal basilar stenosis. The left  AICA origin is patent although no right AICA flow is identified. The basilar artery remains diminutive and mildly irregular and tapers to the basilar tip. Patent SCA origins. Fetal type bilateral PCA origins. Right PCA branches appear within normal limits. Possible high-grade stenosis of the left PCA distal P2 or proximal P3 segment versus artifact (series 1039, image 16). There is distal left PCA flow signal preserved. Antegrade flow in both ICA siphons. Mild supraclinoid right siphon irregularity and stenosis suspected (series 1023 image 4). No left siphon stenosis. Ophthalmic and posterior communicating artery origins are normal. Patent carotid termini. Patent MCA and ACA origins. Questionable mild irregularity at the right MCA and ACA origins. Left ACA, anterior communicating artery and visible ACA branches are within normal limits. Left MCA M1 segment is mildly tortuous and bifurcates early without stenosis. Similar early right MCA bifurcation without stenosis. Visible bilateral MCA branches appear patent with mild irregularity. IMPRESSION: 1. No large vessel occlusion. Possible occlusion of the right AICA, or it could be diminutive or absent. 2. Severe stenosis versus artifact in the left PCA distal P2/proximal P3 segment. 3. Suggestion of intracranial atherosclerosis involving the anterior and posterior circulation, including mild irregularity of the: distal  right vertebral artery, basilar artery, supraclinoid right ICA, and bilateral MCA branches. 4. Non dominant left vertebral artery functionally terminates in PICA. Fetal type bilateral PCA origins. Electronically Signed   By: Odessa Fleming M.D.   On: 03/20/2019 09:26   MR ANGIO NECK WO CONTRAST  Result Date: 03/20/2019 CLINICAL DATA:  39 year old male with recent dizziness and hearing loss. Patchy acute right brainstem/cerebellar peduncle infarct on brain MRI yesterday. EXAM: MRA NECK WITHOUT CONTRAST TECHNIQUE: Angiographic images of the neck were obtained  using MRA technique without intravenous contrast. Carotid stenosis measurements (when applicable) are obtained utilizing NASCET criteria, using the distal internal carotid diameter as the denominator. COMPARISON:  Brain MRI yesterday. FINDINGS: Good quality 2D and 3D time-of-flight images. A 3 vessel arch configuration is evident. There is antegrade flow in the bilateral cervical carotid arteries. Carotid bifurcations and cervical ICAs appear within normal limits. Grossly normal proximal subclavian arteries. Antegrade flow in both cervical vertebral arteries to the skull base, the right is dominant and the left is diminutive. No cervical vertebral artery stenosis is evident. IMPRESSION: Negative neck MRA.  Dominant right vertebral artery. Electronically Signed   By: Odessa Fleming M.D.   On: 03/20/2019 09:20   ECHOCARDIOGRAM COMPLETE  Result Date: 03/20/2019   ECHOCARDIOGRAM REPORT   Patient Name:   Joel [name] Date of Exam: 03/20/2019 Medical Rec #:  [id number]     Height:       67.0 in Accession #:    [id number]    Weight:       270.0 lb Date of Birth:  [birth year]      BSA:          2.30 m Patient Age:    38 years      BP:           110/81 mmHg Patient Gender: M             HR:           87 bpm. [facility] Procedure: 2D Echo Indications:    Stroke 434.91 / I163.9  History:        Patient has prior history of Echocardiogram examinations, most                 recent 03/02/2017. CHF, Stroke; Risk Factors:Hypertension,                 Diabetes and Non-Smoker. Dilated cardiomyopathy , Chronic Kidney                 Disease.  Sonographer:    Jeryl Columbia Referring Phys: [id number] ARSHAD N KAKRAKANDY IMPRESSIONS  1. Left ventricular ejection fraction, by visual estimation, is 55 to 60%. The left ventricle has normal function. There is severely increased left ventricular hypertrophy.  2. Left ventricular diastolic parameters are consistent with Grade I diastolic dysfunction (impaired relaxation).  3. The left  ventricle has no regional wall motion abnormalities.  4. Global right ventricle has normal systolic function.The right ventricular size is normal. No increase in right ventricular wall thickness.  5. Left atrial size was normal.  6. Right atrial size was normal.  7. The mitral valve is grossly normal. No evidence of mitral valve regurgitation.  8. The tricuspid valve is grossly normal.  9. The tricuspid valve is grossly normal. Tricuspid valve regurgitation is mild. 10. The aortic valve is tricuspid. Aortic valve regurgitation is not visualized. Mild aortic valve sclerosis without stenosis. 11. The pulmonic valve was grossly normal.  Pulmonic valve regurgitation is not visualized. 12. The inferior vena cava is normal in size with greater than 50% respiratory variability, suggesting right atrial pressure of 3 mmHg. FINDINGS  Left Ventricle: Left ventricular ejection fraction, by visual estimation, is 55 to 60%. The left ventricle has normal function. The left ventricle has no regional wall motion abnormalities. The left ventricular internal cavity size was the left ventricle is normal in size. There is severely increased left ventricular hypertrophy. Concentric left ventricular hypertrophy. Left ventricular diastolic parameters are consistent with Grade I diastolic dysfunction (impaired relaxation). Normal left atrial pressure. Right Ventricle: The right ventricular size is normal. No increase in right ventricular wall thickness. Global RV systolic function is has normal systolic function. Left Atrium: Left atrial size was normal in size. Right Atrium: Right atrial size was normal in size Pericardium: There is no evidence of pericardial effusion. Mitral Valve: The mitral valve is grossly normal. There is mild thickening of the mitral valve leaflet(s). No evidence of mitral valve regurgitation. Tricuspid Valve: The tricuspid valve is grossly normal. Tricuspid valve regurgitation is mild. Aortic Valve: The aortic valve  is tricuspid. . There is mild thickening of the aortic valve. Aortic valve regurgitation is not visualized. Mild aortic valve sclerosis is present, with no evidence of aortic valve stenosis. There is mild thickening of the aortic valve. Pulmonic Valve: The pulmonic valve was grossly normal. Pulmonic valve regurgitation is not visualized. Pulmonic regurgitation is not visualized. Aorta: The aortic root is normal in size and structure. Venous: The inferior vena cava is normal in size with greater than 50% respiratory variability, suggesting right atrial pressure of 3 mmHg. IAS/Shunts: No atrial level shunt detected by color flow Doppler.  LEFT VENTRICLE PLAX 2D LVIDd:         3.90 cm  Diastology LVIDs:         2.30 cm  LV e' lateral:   5.44 cm/s LV PW:         1.60 cm  LV E/e' lateral: 7.9 LV IVS:        1.40 cm  LV e' medial:    6.64 cm/s LVOT diam:     2.00 cm  LV E/e' medial:  6.5 LV SV:         48 ml LV SV Index:   19.37 LVOT Area:     3.14 cm  RIGHT VENTRICLE RV S prime:     16.00 cm/s TAPSE (M-mode): 1.6 cm LEFT ATRIUM             Index       RIGHT ATRIUM           Index LA diam:        3.90 cm 1.70 cm/m  RA Area:     13.40 cm LA Vol (A2C):   25.4 ml 11.06 ml/m RA Volume:   28.80 ml  12.54 ml/m LA Vol (A4C):   55.5 ml 24.16 ml/m LA Biplane Vol: 37.9 ml 16.50 ml/m   AORTA Ao Root diam: 2.50 cm MITRAL VALVE MV Area (PHT): 4.21 cm             SHUNTS MV PHT:        52.20 msec           Systemic Diam: 2.00 cm MV Decel Time: 180 msec MV E velocity: 43.00 cm/s 103 cm/s MV A velocity: 66.40 cm/s 70.3 cm/s MV E/A ratio:  0.65       1.5  Prentice Docker MD Electronically  signed by Kate Sable MD Signature Date/Time: 03/20/2019/12:51:02 PM    Final     Scheduled Meds: . aspirin  300 mg Rectal Daily   Or  . aspirin  325 mg Oral Daily  . atorvastatin  80 mg Oral q1800  . [START ON 03/21/2019] clopidogrel  75 mg Oral Daily  . enoxaparin (LOVENOX) injection  40 mg Subcutaneous Q24H  . insulin aspart  0-9  Units Subcutaneous TID WC  . insulin detemir  45 Units Subcutaneous BID  . pantoprazole  40 mg Oral Daily   Continuous Infusions: PRN Meds: acetaminophen **OR** acetaminophen (TYLENOL) oral liquid 160 mg/5 mL **OR** acetaminophen  Time spent: 35 minutes  Author: Berle Mull, MD [facility] 03/20/2019 4:22 PM  To reach On-call, see care teams to locate the attending and reach out to them via [email or web address]. If 7PM-7AM, please contact night-coverage If you still have difficulty reaching the attending provider, please page the [facility] (Director on Call) for [facility] on amion for assistance.

## 2019-03-20 NOTE — Evaluation (Signed)
Occupational Therapy Evaluation Patient Details Name: Joel [name] MRN: [id number] DOB: 05-27-1980 Today's Date: 03/20/2019    History of Present Illness 39 yo male with onset of dizziness and LOB was admitted, had R facial numbness, mult falls, ataxia, dizziness.  Dx with acute infarct along R cerebellar peduncle.  Has acute kidney disease, intracranial atherosclerosis and HLD.  PMHx:  atheroslcerosis, anxiety, CHF, DM, obesity, stroke, EF 45-50%, sleep apnea, HTN, CKD3,    Clinical Impression   Pt admitted with above. He demonstrates the below listed deficits and will benefit from continued OT to maximize safety and independence with BADLs.  Pt presents to OT with Rt sided ataxia, significant visual deficits, impaired balance, and decreased activity tolerance as he has significant dizziness and nausea with all attempts at mobility.  He currently requires min - mod A for ADLs, but requires frequent seated rest breaks due to onset of dizziness and nausea.  He lives with his wife, and prior to onset of symptoms (~3 weeks ago), he was fully independent with ADLs.   Since that time, he has required assist with LB ADLs, and has experienced multiple falls.  He reports constant dizziness and nausea with all attempts at moving, and has significantly reduced his activity due to this.  His wife is very supportive.  Feel he would benefit from CIR to allow him to regain independence with ADLs and functional mobility, reduce his risk of falls and injury, and to provide education to both wife and pt to reduce burden of care. Will follow acutely.       Follow Up Recommendations  CIR    Equipment Recommendations  None recommended by OT    Recommendations for Other Services Rehab consult     Precautions / Restrictions Precautions Precautions: Fall Precaution Comments: dizzy upon standing and at times sitting Restrictions Weight Bearing Restrictions: No      Mobility Bed Mobility Overal bed mobility:  Needs Assistance Bed Mobility: Rolling;Sidelying to Sit;Sit to Supine Rolling: Supervision Sidelying to sit: Min guard Supine to sit: (P) Min assist Sit to supine: Min guard   General bed mobility comments: pt is very guarded with movement   Transfers Overall transfer level: Needs assistance Equipment used: Rolling walker (2 wheeled) Transfers: Sit to/from [employer] Sit to Stand: Min assist Stand pivot transfers: Mod assist       General transfer comment: assist for balance     Balance Overall balance assessment: Needs assistance Sitting-balance support: Feet supported Sitting balance-Leahy Scale: Fair Sitting balance - Comments: able to maintain static sitting with supervision  Postural control: (P) Right lateral lean Standing balance support: Bilateral upper extremity supported Standing balance-Leahy Scale: Poor Standing balance comment: requires bil. UE support                            ADL either performed or assessed with clinical judgement   ADL Overall ADL's : Needs assistance/impaired Eating/Feeding: Modified independent;Bed level   Grooming: Wash/dry hands;Wash/dry face;Oral care;Brushing hair;Set up;Sitting   Upper Body Bathing: Set up;Sitting   Lower Body Bathing: Minimal assistance;Sit to/from stand   Upper Body Dressing : Set up;Sitting Upper Body Dressing Details (indicate cue type and reason): pull over shirt  Lower Body Dressing: Moderate assistance;Sit to/from stand Lower Body Dressing Details (indicate cue type and reason): requires assist for standing and assist to don socks and shoes  Toilet Transfer: Ambulation;Minimal assistance;+2 for physical assistance;+2 for safety/equipment;Comfort height toilet;RW;Grab bars  Toileting- Clothing Manipulation and Hygiene: Minimal assistance;Sit to/from stand       Functional mobility during ADLs: Minimal assistance;Moderate assistance;Rolling walker General ADL Comments: Pt  requires assist due to impaired balance, and activity is significantly limited by dizziness and subsequent nausea      Vision Baseline Vision/History: No visual deficits Patient Visual Report: Blurring of vision;Eye fatigue/eye pain/headache Vision Assessment?: Yes Eye Alignment: Impaired (comment) Ocular Range of Motion: Restricted on the left;Restricted on the right;Restricted looking up Additional Comments: Assessment limited due to increasing headache and eye pain.  He reports blurriness Rt eye with impaired acuity.  Rt eye restricted with abduction, difficult to determine if due to weakness or if it is limited due to pain.   Lt eye with unable to track laterally past midline, and increased pain with superior gaze.  Pt reports dizziness with all attempts at visual assessment      Perception Perception Perception Tested?: Yes   Praxis Praxis Praxis tested?: Within functional limits    Pertinent Vitals/Pain Pain Assessment: Faces Faces Pain Scale: Hurts even more Pain Location: headache and eye pain  Pain Descriptors / Indicators: Grimacing;Aching;Headache Pain Intervention(s): Monitored during session     Hand Dominance Right   Extremity/Trunk Assessment Upper Extremity Assessment Upper Extremity Assessment: RUE deficits/detail RUE Deficits / Details: ataxia noted  RUE Sensation: decreased proprioception RUE Coordination: decreased fine motor;decreased gross motor   Lower Extremity Assessment Lower Extremity Assessment: Defer to PT evaluation RLE Deficits / Details: (P) moderate incoordination with heel on shin and RAM RLE Coordination: (P) decreased gross motor   Cervical / Trunk Assessment Cervical / Trunk Assessment: (P) Normal   Communication Communication Communication: No difficulties   Cognition Arousal/Alertness: Awake/alert Behavior During Therapy: Flat affect Overall Cognitive Status: Within Functional Limits for tasks assessed                                  General Comments: grossly assessed.  Pt is very guarded due to dizziness and pain resulting in limited interactions    General Comments  Wife present.  Pt and wife instructed in gaze fixation with mobility.  Targets were posted around room for him to fixate gaze when moving in hopes of decreasing dizziness and improving activity tolerance.     Exercises Exercises: (P) Other exercises   Shoulder Instructions      Home Living Family/patient expects to be discharged to:: Private residence Living Arrangements: Spouse/significant other Available Help at Discharge: Family;Available 24 hours/day Type of Home: House Home Access: Stairs to enter [employer] of Steps: (P) 5 Entrance Stairs-Rails: (P) Can reach both Home Layout: (P) One level     Bathroom Shower/Tub: Producer, television/film/video: Standard     Home Equipment: Shower seat - built in   Additional Comments: (P) despite recent falls has not used an AD      Prior Functioning/Environment Level of Independence: Independent        Comments: Pt does not work, but drives, enjoys riding his motorcycle, Since onset of symptoms, pt has required assist with donning/doffing socks and shoes, has had several falls, and has significantly limited his activity due to severity of dizziness          OT Problem List: Decreased activity tolerance;Impaired balance (sitting and/or standing);Impaired vision/perception;Decreased coordination;Decreased safety awareness;Decreased knowledge of use of DME or AE;Obesity;Impaired UE functional use;Pain;Impaired sensation      OT Treatment/Interventions: Self-care/ADL training;Neuromuscular  education;DME and/or AE instruction;Therapeutic activities;Cognitive remediation/compensation;Visual/perceptual remediation/compensation;Patient/family education;Balance training    OT Goals(Current goals can be found in the care plan section) Acute Rehab OT Goals Patient Stated  Goal: to be able to ride motorcycle  OT Goal Formulation: With patient/family Time For Goal Achievement: 04/03/19 Potential to Achieve Goals: Good ADL Goals Pt Will Perform Grooming: with min assist;standing Pt Will Perform Lower Body Dressing: with min assist;sit to/from stand Additional ADL Goal #1: Pt will be able to complete 10 min ADL task(s) with no more than 2 rest breaks and dizziness no greater than 4/10 Additional ADL Goal #2: Pt will independently incorporate gaze fixation strategies into daily activities to reduce dizziness  OT Frequency: Min 3X/week   Barriers to D/C:            Co-evaluation              AM-PAC OT "6 Clicks" Daily Activity     Outcome Measure Help from another person eating meals?: None Help from another person taking care of personal grooming?: A Little Help from another person toileting, which includes using toliet, bedpan, or urinal?: A Little Help from another person bathing (including washing, rinsing, drying)?: A Little Help from another person to put on and taking off regular upper body clothing?: A Little Help from another person to put on and taking off regular lower body clothing?: A Lot 6 Click Score: 18   End of Session Equipment Utilized During Treatment: Rolling walker Nurse Communication: Mobility status  Activity Tolerance: Patient limited by pain;Other (comment)(nausea/dizziness ) Patient left: in bed;with call bell/phone within reach;with family/visitor present  OT Visit Diagnosis: Unsteadiness on feet (R26.81);Ataxia, unspecified (R27.0);Low vision, both eyes (H54.2)                Time: 9532-0233 OT Time Calculation (min): 29 min Charges:  OT General Charges $OT Visit: 1 Visit OT Evaluation $OT Eval Moderate Complexity: 1 Mod OT Treatments $Therapeutic Activity: 8-22 mins  . Nilsa Nutting., OTR/L Acute Rehabilitation Services Pager [phone] Office [phone]  Lucille Passy M 03/20/2019, 1:12 PM

## 2019-03-21 DIAGNOSIS — I639 Cerebral infarction, unspecified: Secondary | ICD-10-CM

## 2019-03-21 LAB — GLUCOSE, CAPILLARY
Glucose-Capillary: 86 mg/dL (ref 70–99)
Glucose-Capillary: 144 mg/dL — ABNORMAL HIGH (ref 70–99)
Glucose-Capillary: 119 mg/dL — ABNORMAL HIGH (ref 70–99)
Glucose-Capillary: 128 mg/dL — ABNORMAL HIGH (ref 70–99)

## 2019-03-21 LAB — BASIC METABOLIC PANEL
Anion gap: 10 (ref 5–15)
BUN: 18 mg/dL (ref 6–20)
CO2: 24 mmol/L (ref 22–32)
Calcium: 8.3 mg/dL — ABNORMAL LOW (ref 8.9–10.3)
Chloride: 104 mmol/L (ref 98–111)
Creatinine, Ser: 2.04 mg/dL — ABNORMAL HIGH (ref 0.61–1.24)
GFR calc Af Amer: 47 mL/min — ABNORMAL LOW (ref >60)
GFR calc non Af Amer: 40 mL/min — ABNORMAL LOW (ref >60)
Glucose, Bld: 96 mg/dL (ref 70–99)
Potassium: 3.1 mmol/L — ABNORMAL LOW (ref 3.5–5.1)
Sodium: 138 mmol/L (ref 135–145)

## 2019-03-21 LAB — CBC
HCT: 41.5 % (ref 39.0–52.0)
Hemoglobin: 13.6 g/dL (ref 13.0–17.0)
MCH: 27.3 pg (ref 26.0–34.0)
MCHC: 32.8 g/dL (ref 30.0–36.0)
MCV: 83.2 fL (ref 80.0–100.0)
Platelets: 190 10*3/uL (ref 150–400)
RBC: 4.99 MIL/uL (ref 4.22–5.81)
RDW: 15.7 % — ABNORMAL HIGH (ref 11.5–15.5)
WBC: 6 10*3/uL (ref 4.0–10.5)
nRBC: 0 % (ref 0.0–0.2)

## 2019-03-21 LAB — RPR: RPR Ser Ql: NONREACTIVE

## 2019-03-21 LAB — MAGNESIUM: Magnesium: 2.1 mg/dL (ref 1.7–2.4)

## 2019-03-21 MED ORDER — BENZOCAINE 10 % MT GEL
Freq: Four times a day (QID) | OROMUCOSAL | Status: DC | PRN
  Filled 2019-03-21: qty 9

## 2019-03-21 MED ORDER — BACITRACIN-NEOMYCIN-POLYMYXIN OINTMENT TUBE
TOPICAL_OINTMENT | Freq: Two times a day (BID) | CUTANEOUS | Status: DC
  Filled 2019-03-21: qty 14

## 2019-03-21 MED ORDER — POTASSIUM CHLORIDE CRYS ER 20 MEQ PO TBCR
40.0000 meq | EXTENDED_RELEASE_TABLET | ORAL | Status: AC
  Administered 2019-03-21 (×2): 40 meq via ORAL
  Filled 2019-03-21 (×2): qty 2

## 2019-03-21 MED ORDER — LIDOCAINE 4 % EX CREA
TOPICAL_CREAM | Freq: Two times a day (BID) | CUTANEOUS | Status: DC | PRN
  Filled 2019-03-21: qty 5

## 2019-03-21 NOTE — Progress Notes (Signed)
Patient not being compliant with safety measures. Patient getting out of bed despite alarms and frequent interventions. Patient trying to remove lip piercing that was put on too tight by him. Patient removed all facial piercings before MRI on Saturday 03/20/2019. Patient put piercings back in 03/20/2019 after MRI but the lip piercing was placed too tight. Nurse and other staff are having to frequently visit patient for noncompliance with safety measures. Dr. Allena Katz notified about piercing. Verbal orders to leave piercing alone to prevent irritation and try later. Patient noncompliant with leaving the piercing alone, continuously trying to pull piercing out. Patient and patient's wife called their friend. AC approved friend to come in briefly. Joel [name]

## 2019-03-21 NOTE — Evaluation (Signed)
Speech Language Pathology Evaluation Patient Details Name: Joel [name] MRN: [id number] DOB: Nov 15, 1980 Today's Date: 03/21/2019 Time: 7371-0626 SLP Time Calculation (min) (ACUTE ONLY): 26 min  Problem List:  Patient Active Problem List   Diagnosis Date Noted  . Acute CVA (cerebrovascular accident) (HCC) 03/19/2019  . 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 Depression, major, recurrent, moderate (HCC) 12/19/2017  . Fatigue 04/07/2017  . Dilated cardiomyopathy (HCC) 02/23/2017  . Hypertensive heart disease with heart failure (HCC) 02/23/2017  . Essential hypertension 02/21/2017  . 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 H/O medication noncompliance 02/12/2017  . Chronic kidney disease (CKD), stage III (moderate) 02/12/2017  . Systolic CHF, chronic (HCC) 10/11/2014   Past Medical History:  Past Medical History:  Diagnosis Date  . Anxiety   . Chronic diastolic CHF (congestive heart failure) (HCC)   . Diabetes mellitus without complication (HCC)   . Hyperlipidemia   . Hypertension   . Hypertensive heart disease   . Noncompliance with medications   . Obesity   . Renal disorder   . Stroke (HCC)   . Systolic dysfunction    a. TTE 06/2014: EF 45-50%, normal wall motion, mild MR, mild AI   Past Surgical History:  Past Surgical History:  Procedure Laterality Date  . NO PAST SURGERIES     HPI:  Joel [name] is a 39 y.o. male with history of nonischemic cardiomyopathy last EF measured was 30% with grade 2 diastolic dysfunction in 2019 with history of diabetes mellitus type 1, chronic disease stage III hypertension.  He started experiencing right facial numbness and difficulty moving the right side for almost 3 weeks now with some vomiting initially.  Patient was treated for vestibular problems and referred to the ENT who referred patient to the ER given the concern for possible stroke.  Patient during this time  also started having ataxia and visual blurriness.  Denies any headache fever chills.  MRI of the head was showing small acute infarction involving the right middle cerebellar peduncle. Corresponding enhancement may reflect early blood brain barrier disruption. Given presence of vascular risk factors, ischemia is favored over demyelination. There is also a probable small chronic infarct near the right frontal horn.  Chest xray was showing no active disease.     Assessment / Plan / Recommendation Clinical Impression  Cognitive/linguistic and motor speech evaluation were completed.  Cranial nerve exam was completed and mildly reduced movement was noted in the patient's forehead on the right as well as reduced eye closing on the right.  Decreased lingual range of motion was seen as well as decreased strength on the right side.  The patient appeared to have no sensation in the region of his right cheek although interestingly he did not endorse a difference in sensation from the right to left side of his face.  He also has no sensation on the anterior portion of his tongue on the right side.  Speech was clear and easy to understand.  No discernible dysarthria or apraxia was noted.  He achieved an overall score of 25/30 on the Mini Mental State Exam suggesting functional cognitive/linguistic skills.  At baseline, the patient lives with his wife and brother in law and has not been working.  He has no household responsibilities.  His wife manages his medications and takes care of paying the bills and all meal preparation.  He was fully oriented to person, place and situation.  He was partially oriented to time knowing the year, day of week and month but did not know the date.  He had good immediate recall of 3 novel words, however, given a short delay he was only able to recall 1/3 independently.  Use of semantic cue did facilitate recall of other two novel words.  He demonstrated mild issues on the attention task.   Language skills appeared to be grossly intact.  However, his wife reported some word finding issues as well as what sounded like paraphasias.  She reported that he was aware when he was not saying the correct work and would attempt to spontaneously correct it but would often just repeat the sentence with the same wrong word.  This was not seen during evaluation today.  He was able to provide logical solutions to simple problems and complete a clock drawing task.  He was able to write a sentence but legibility was decreased and appeared to be due to ataxia.  He demonstrated similiar issues while copying a design.  Given current presentation ST will not follow during acute stay.  Recommend in depth evaluation at the next level of care.  If ST can be of further assistance during the patient's acute stay please re-consult.      SLP Assessment  SLP Recommendation/Assessment: All further Speech Lanaguage Pathology  needs can be addressed in the next venue of care SLP Visit Diagnosis: Attention and concentration deficit Attention and concentration deficit following: Cerebral infarction    Follow Up Recommendations  (In depth evaluation at next level.)          SLP Evaluation Cognition  Overall Cognitive Status: Within Functional Limits for tasks assessed Arousal/Alertness: Awake/alert Orientation Level: Oriented X4 Attention: Focused Focused Attention: Appears intact Memory: Impaired Memory Impairment: Decreased recall of new information Awareness: Appears intact Problem Solving: Appears intact Safety/Judgment: Appears intact       Comprehension  Auditory Comprehension Overall Auditory Comprehension: Appears within functional limits for tasks assessed Conversation: Simple Reading Comprehension Reading Status: Within funtional limits    Expression Expression Primary Mode of Expression: Verbal Verbal Expression Overall Verbal Expression: Appears within functional limits for tasks  assessed Initiation: No impairment Automatic Speech: Name;Social Response Level of Generative/Spontaneous Verbalization: Sentence Repetition: No impairment Naming: No impairment Pragmatics: No impairment Non-Verbal Means of Communication: Not applicable Written Expression Dominant Hand: Right Written Expression: Within Functional Limits   Oral / Motor  Oral Motor/Sensory Function Overall Oral Motor/Sensory Function: Moderate impairment Facial ROM: Reduced right Facial Symmetry: Within Functional Limits Facial Strength: Within Functional Limits Facial Sensation: Reduced right;Suspected CN V (Trigeminal) dysfunction Lingual ROM: Reduced right;Suspected CN XII (hypoglossal) dysfunction Lingual Symmetry: Within Functional Limits Lingual Strength: Reduced;Suspected CN XII (hypoglossal) dysfunction Lingual Sensation: Reduced;Suspected CN VII (facial) dysfunction-anterior 2/3 tongue Mandible: Within Functional Limits Motor Speech Overall Motor Speech: Appears within functional limits for tasks assessed Respiration: Within functional limits Phonation: Normal Resonance: Within functional limits Articulation: Within functional limitis Intelligibility: Intelligible Motor Planning: Witnin functional limits Motor Speech Errors: Not applicable   GO                   Shelly Flatten, MA, [facility] SLP [phone]  Lamar Sprinkles 03/21/2019, 10:59 AM

## 2019-03-21 NOTE — Progress Notes (Signed)
Patient states his friend can attempt to remove lip piercing. This nurse and Carlye Grippe RN witnessed statement. Tyron Russell Jaremy Nosal

## 2019-03-21 NOTE — Progress Notes (Signed)
Patient complaining of pain in lips around piercing. Inside of piercing is more embedded than earlier. Dr. Allena Katz notified. Joel [name] Joel [name]

## 2019-03-21 NOTE — Consult Note (Signed)
Physical Medicine and Rehabilitation Consult Reason for Consult:inpt rehab Referring Physician: Dr Allena Katz   HPI: Joel [name] is a 39 y.o. 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  Pt reports initially it was thought he had ear infection or something related to his ear causing his balance to be impaired and was sent to an ENT/specialist.  However was sent back to ER and dx'd with R cerebellar peduncle stroke.   Pt reports he's frustrated because cannot hear on R anymore and also c/o some visual blurriness as well.    ROS: an entire ROS was completed and found to be negative except for HPI.  Past Medical History:  Diagnosis Date  . Anxiety   . Chronic diastolic CHF (congestive heart failure) (HCC)   . Diabetes mellitus without complication (HCC)   . Hyperlipidemia   . Hypertension   . Hypertensive heart disease   . Noncompliance with medications   . Obesity   . Renal disorder   . Stroke (HCC)   . Systolic dysfunction    a. TTE 06/2014: EF 45-50%, normal wall motion, mild MR, mild AI   Past Surgical History:  Procedure Laterality Date  . NO PAST SURGERIES     Family History  Problem Relation Age of Onset  . Hypertension Mother   . Hyperlipidemia Mother   . Diabetes Mother   . CAD Father        a. stenting in his early 24s  . Heart disease Father   . Stroke Father   . Diabetes Maternal Grandmother   . Diabetes Maternal Grandfather   . CAD Paternal Grandmother   . CAD Paternal Grandfather   . Colon cancer Paternal Grandfather   . Diabetes Paternal Uncle    Social History:  reports that he has never smoked. He has never used smokeless tobacco. He reports previous alcohol use. He reports that he does not use drugs. Allergies:  Allergies  Allergen Reactions  . Bidil [Isosorb Dinitrate-Hydralazine] Other (See  Comments)    Severe headache  . Nitroglycerin Other (See Comments)    Causes severe headaches   Medications Prior to Admission  Medication Sig Dispense Refill  . amLODipine (NORVASC) 5 MG tablet Take 1 tablet (5 mg total) by mouth daily. 90 tablet 3  . aspirin 81 MG chewable tablet Chew 1 tablet (81 mg total) by mouth daily.    Marland Kitchen atorvastatin (LIPITOR) 80 MG tablet Take 1 tablet (80 mg total) by mouth daily at 6 PM. 90 tablet 3  . carvedilol (COREG) 25 MG tablet Take 1 tablet (25 mg total) by mouth 2 (two) times daily with a meal. 60 tablet 1  . diazepam (VALIUM) 5 MG tablet Take 1 tablet (5 mg total) by mouth every 8 (eight) hours as needed (dizziness or vertigo). 20 tablet 0  . empagliflozin (JARDIANCE) 25 MG TABS tablet Take 25 mg by mouth daily.     Marland Kitchen esomeprazole (NEXIUM) 20 MG capsule Take 20 mg by mouth daily at 12 noon.    . fluticasone (FLONASE) 50 MCG/ACT nasal spray Place 2 sprays into both nostrils daily. Use for 4-6 weeks then stop and use seasonally or as needed. 16 g 3  . furosemide (LASIX) 20 MG tablet Take 1 tablet (20 mg total) by  mouth daily. For 5-10 days then as needed 20 tablet 0  . liraglutide (VICTOZA) 18 MG/3ML SOPN Inject 1.2 mg into the skin daily.     . sacubitril-valsartan (ENTRESTO) 97-103 MG Take 1 tablet by mouth 2 (two) times daily. 180 tablet 3  . spironolactone (ALDACTONE) 25 MG tablet Take 25 mg by mouth daily.    Marland Kitchen HUMALOG 100 UNIT/ML injection 10 units at breakfast 10 units at lunch 10 units at dinner    . insulin aspart (NOVOLOG) 100 UNIT/ML injection Inject 5 Units into the skin 3 (three) times daily with meals. (Patient not taking: Reported on 03/20/2019) 10 mL 11  . insulin detemir (LEVEMIR) 100 UNIT/ML injection Inject 0.45 mLs (45 Units total) into the skin 2 (two) times daily. (Patient not taking: Reported on 03/20/2019) 20 mL 2  . meclizine (ANTIVERT) 25 MG tablet Take 1 tablet (25 mg total) by mouth 3 (three) times daily as needed for dizziness or  nausea. (Patient not taking: Reported on 03/20/2019) 30 tablet 1  . ofloxacin (FLOXIN) 0.3 % OTIC solution Place 5 drops into the right ear 2 (two) times daily. For 7 days (Patient not taking: Reported on 03/20/2019) 5 mL 0  . ondansetron (ZOFRAN ODT) 4 MG disintegrating tablet Take 1-2 tablets (4-8 mg total) by mouth every 8 (eight) hours as needed for nausea or vomiting. (Patient not taking: Reported on 03/20/2019) 30 tablet 1  . predniSONE (DELTASONE) 20 MG tablet Take 3 tablets daily (60mg ) for 3 days, take 2 tab daily (20mg ) for 4 days, take 1 tab daily (20mg ) for 2 days (Patient not taking: Reported on 03/20/2019) 16 tablet 0    Home: Home Living Family/patient expects to be discharged to:: Private residence Living Arrangements: Spouse/significant other Available Help at Discharge: Family, Available 24 hours/day Type of Home: House Home Access: Stairs to enter of Steps: 5 Entrance Stairs-Rails: Can reach both Home Layout: One level Bathroom Shower/Tub: 05/18/2019: Standard Home Equipment: [id number] - built in Additional Comments: despite recent falls has not used an AD  Lives With: Spouse  Functional History: Prior Function Level of Independence: Independent Comments: Pt does not work, but drives, enjoys riding his motorcycle, Since onset of symptoms, pt has required assist with donning/doffing socks and shoes, has had several falls, and has significantly limited his activity due to severity of dizziness   Functional Status:  Mobility: Bed Mobility Overal bed mobility: Needs Assistance Bed Mobility: Rolling, Sidelying to Sit, Sit to Supine Rolling: Supervision Sidelying to sit: Min guard Supine to sit: Min assist Sit to supine: Min guard General bed mobility comments: pt is very guarded with movement  Transfers Overall transfer level: Needs assistance Equipment used: Rolling walker (2 wheeled) Transfers: Sit to/from Stand, Secretary/administrator  Transfers Sit to Stand: Min assist Stand pivot transfers: Mod assist General transfer comment: assist for balance  Ambulation/Gait Ambulation/Gait assistance: Min assist Gait Distance (Feet): 25 Feet(Sat three times on this trip) Assistive device: Rolling walker (2 wheeled), 1 person hand held assist Gait Pattern/deviations: Step-to pattern, Decreased stride length, Wide base of support, Drifts right/left, Trunk flexed General Gait Details: pt becomes dizzy and cannot control his balance, sitting often and unsafe, unable to focus on his limitations to determine his limits Gait velocity: reduced Gait velocity interpretation: <1.31 ft/sec, indicative of household ambulator    ADL: ADL Overall ADL's : Needs assistance/impaired Eating/Feeding: Modified independent, Bed level Grooming: Wash/dry hands, Wash/dry face, Oral care, Brushing hair, Set up, Sitting Upper  Body Bathing: Set up, Sitting Lower Body Bathing: Minimal assistance, Sit to/from stand Upper Body Dressing : Set up, Sitting Upper Body Dressing Details (indicate cue type and reason): pull over shirt  Lower Body Dressing: Moderate assistance, Sit to/from stand Lower Body Dressing Details (indicate cue type and reason): requires assist for standing and assist to don socks and shoes  Toilet Transfer: Ambulation, Minimal assistance, +2 for physical assistance, +2 for safety/equipment, Comfort height toilet, RW, Grab bars Toileting- Clothing Manipulation and Hygiene: Minimal assistance, Sit to/from stand Functional mobility during ADLs: Minimal assistance, Moderate assistance, Rolling walker General ADL Comments: Pt requires assist due to impaired balance, and activity is significantly limited by dizziness and subsequent nausea   Cognition: Cognition Overall Cognitive Status: Within Functional Limits for tasks assessed Arousal/Alertness: Awake/alert Orientation Level: Oriented X4 Attention: Focused Focused Attention: Appears  intact Memory: Impaired Memory Impairment: Decreased recall of new information Awareness: Appears intact Problem Solving: Appears intact Safety/Judgment: Appears intact Cognition Arousal/Alertness: Awake/alert Behavior During Therapy: Flat affect Overall Cognitive Status: Within Functional Limits for tasks assessed General Comments: grossly assessed.  Pt is very guarded due to dizziness and pain resulting in limited interactions   Blood pressure 127/87, pulse 79, temperature 98.2 F (36.8 C), temperature source Oral, resp. rate 16, height  (1.702 m), weight 122.5 kg, SpO2 100 %. Physical Exam  Pt awake, but sleepy, laying in bed on side facing away from window and toward door; slow to make eye contact, talk with examiner, or answer questions, but was cordial, NAD Was bothered by the light in room HEENT: normocephalic, atraumatic, nystagmus to L horizontal however EOMI B/L; facial sensation impaired/decreased on R Decreased hearing on R side- kept turning his head for L ear to be closer RRR CTA B/L Soft, NT, ND, (+)BS MS: RUE delt, biceps, triceps, WE, grip and finger abd slightly less at 4+/5 LUE 5/5 in same muscles RLE- HF, KE, KF, Df and PF 3/5 LLE_ 5/5 in same muscles Neuro: Intact sensation in LUE and LLE except in stocking pattern on L foot Same for R side Skin- no signs of skin breakdown Psych flat affect;  Results for orders placed or performed during the hospital encounter of 03/19/19 (from the past 24 hour(s))  Basic metabolic panel     Status: Abnormal   Collection Time: 03/21/19  5:24 AM  Result Value Ref Range   Sodium 138 135 - 145 mmol/L   Potassium 3.1 (L) 3.5 - 5.1 mmol/L   Chloride 104 98 - 111 mmol/L   CO2 24 22 - 32 mmol/L   Glucose, Bld 96 70 - 99 mg/dL   BUN 18 6 - 20 mg/dL   Creatinine, Ser 1.61 (H) 0.61 - 1.24 mg/dL   Calcium 8.3 (L) 8.9 - 10.3 mg/dL   GFR calc non Af Amer 40 (L) >60 mL/min   GFR calc Af Amer 47 (L) >60 mL/min   Anion gap 10  5 - 15  CBC     Status: Abnormal   Collection Time: 03/21/19  5:24 AM  Result Value Ref Range   WBC 6.0 4.0 - 10.5 K/uL   RBC 4.99 4.22 - 5.81 MIL/uL   Hemoglobin 13.6 13.0 - 17.0 g/dL   HCT 09.6 04.5 - 40.9 %   MCV 83.2 80.0 - 100.0 fL   MCH 27.3 26.0 - 34.0 pg   MCHC 32.8 30.0 - 36.0 g/dL   RDW 81.1 (H) 91.4 - 78.2 %   Platelets 190 150 - 400 K/uL  nRBC 0.0 0.0 - 0.2 %  Magnesium     Status: None   Collection Time: 03/21/19  5:24 AM  Result Value Ref Range   Magnesium 2.1 1.7 - 2.4 mg/dL  Glucose, capillary     Status: None   Collection Time: 03/21/19  6:06 AM  Result Value Ref Range   Glucose-Capillary 86 70 - 99 mg/dL  Glucose, capillary     Status: Abnormal   Collection Time: 03/21/19 11:57 AM  Result Value Ref Range   Glucose-Capillary 144 (H) 70 - 99 mg/dL   Comment 1 Notify RN    Comment 2 Document in Chart   Glucose, capillary     Status: Abnormal   Collection Time: 03/21/19  3:30 PM  Result Value Ref Range   Glucose-Capillary 119 (H) 70 - 99 mg/dL   Comment 1 Repeat Test    MR ANGIO HEAD WO CONTRAST  Result Date: 03/20/2019 CLINICAL DATA:  39 year old male with recent dizziness and hearing loss. Patchy acute right brainstem/cerebellar peduncle infarct on brain MRI yesterday. EXAM: MRA HEAD WITHOUT CONTRAST TECHNIQUE: Angiographic images of the Circle of Willis were obtained using MRA technique without intravenous contrast. COMPARISON:  Brain MRI 03/19/2019 and earlier. Neck MRA today reported separately. FINDINGS: Antegrade flow in the posterior circulation with dominant right vertebral artery. The non dominant left vertebral artery appears to functionally terminates in PICA which is patent. The right PICA origin is patent on series 5, image 21. There is mild tapering of the right vertebrobasilar junction, but no significant distal vertebral or proximal basilar stenosis. The left AICA origin is patent although no right AICA flow is identified. The basilar artery remains  diminutive and mildly irregular and tapers to the basilar tip. Patent SCA origins. Fetal type bilateral PCA origins. Right PCA branches appear within normal limits. Possible high-grade stenosis of the left PCA distal P2 or proximal P3 segment versus artifact (series 1039, image 16). There is distal left PCA flow signal preserved. Antegrade flow in both ICA siphons. Mild supraclinoid right siphon irregularity and stenosis suspected (series 1023 image 4). No left siphon stenosis. Ophthalmic and posterior communicating artery origins are normal. Patent carotid termini. Patent MCA and ACA origins. Questionable mild irregularity at the right MCA and ACA origins. Left ACA, anterior communicating artery and visible ACA branches are within normal limits. Left MCA M1 segment is mildly tortuous and bifurcates early without stenosis. Similar early right MCA bifurcation without stenosis. Visible bilateral MCA branches appear patent with mild irregularity. IMPRESSION: 1. No large vessel occlusion. Possible occlusion of the right AICA, or it could be diminutive or absent. 2. Severe stenosis versus artifact in the left PCA distal P2/proximal P3 segment. 3. Suggestion of intracranial atherosclerosis involving the anterior and posterior circulation, including mild irregularity of the: distal right vertebral artery, basilar artery, supraclinoid right ICA, and bilateral MCA branches. 4. Non dominant left vertebral artery functionally terminates in PICA. Fetal type bilateral PCA origins. Electronically Signed   By: Odessa Fleming M.D.   On: 03/20/2019 09:26   MR ANGIO NECK WO CONTRAST  Result Date: 03/20/2019 CLINICAL DATA:  39 year old male with recent dizziness and hearing loss. Patchy acute right brainstem/cerebellar peduncle infarct on brain MRI yesterday. EXAM: MRA NECK WITHOUT CONTRAST TECHNIQUE: Angiographic images of the neck were obtained using MRA technique without intravenous contrast. Carotid stenosis measurements (when  applicable) are obtained utilizing NASCET criteria, using the distal internal carotid diameter as the denominator. COMPARISON:  Brain MRI yesterday. FINDINGS: Good quality 2D and 3D  time-of-flight images. A 3 vessel arch configuration is evident. There is antegrade flow in the bilateral cervical carotid arteries. Carotid bifurcations and cervical ICAs appear within normal limits. Grossly normal proximal subclavian arteries. Antegrade flow in both cervical vertebral arteries to the skull base, the right is dominant and the left is diminutive. No cervical vertebral artery stenosis is evident. IMPRESSION: Negative neck MRA.  Dominant right vertebral artery. Electronically Signed   By: Odessa Fleming M.D.   On: 03/20/2019 09:20   ECHOCARDIOGRAM COMPLETE  Result Date: 03/20/2019   ECHOCARDIOGRAM REPORT   Patient Name:   Joel [name] Date of Exam: 03/20/2019 Medical Rec #:  [id number]     Height:       67.0 in Accession #:    [id number]    Weight:       270.0 lb Date of Birth:  August 18, 1980      BSA:          2.30 m Patient Age:    38 years      BP:           110/81 mmHg Patient Gender: M             HR:           87 bpm. [facility] Procedure: 2D Echo Indications:    Stroke 434.91 / I163.9  History:        Patient has prior history of Echocardiogram examinations, most                 recent 03/02/2017. CHF, Stroke; Risk Factors:Hypertension,                 Diabetes and Non-Smoker. Dilated cardiomyopathy , Chronic Kidney                 Disease.  Sonographer:    Jeryl Columbia Referring Phys: [id number] ARSHAD N KAKRAKANDY IMPRESSIONS  1. Left ventricular ejection fraction, by visual estimation, is 55 to 60%. The left ventricle has normal function. There is severely increased left ventricular hypertrophy.  2. Left ventricular diastolic parameters are consistent with Grade I diastolic dysfunction (impaired relaxation).  3. The left ventricle has no regional wall motion abnormalities.  4. Global right ventricle has normal  systolic function.The right ventricular size is normal. No increase in right ventricular wall thickness.  5. Left atrial size was normal.  6. Right atrial size was normal.  7. The mitral valve is grossly normal. No evidence of mitral valve regurgitation.  8. The tricuspid valve is grossly normal.  9. The tricuspid valve is grossly normal. Tricuspid valve regurgitation is mild. 10. The aortic valve is tricuspid. Aortic valve regurgitation is not visualized. Mild aortic valve sclerosis without stenosis. 11. The pulmonic valve was grossly normal. Pulmonic valve regurgitation is not visualized. 12. The inferior vena cava is normal in size with greater than 50% respiratory variability, suggesting right atrial pressure of 3 mmHg. FINDINGS  Left Ventricle: Left ventricular ejection fraction, by visual estimation, is 55 to 60%. The left ventricle has normal function. The left ventricle has no regional wall motion abnormalities. The left ventricular internal cavity size was the left ventricle is normal in size. There is severely increased left ventricular hypertrophy. Concentric left ventricular hypertrophy. Left ventricular diastolic parameters are consistent with Grade I diastolic dysfunction (impaired relaxation). Normal left atrial pressure. Right Ventricle: The right ventricular size is normal. No increase in right ventricular wall thickness. Global RV systolic function is has normal systolic  function. Left Atrium: Left atrial size was normal in size. Right Atrium: Right atrial size was normal in size Pericardium: There is no evidence of pericardial effusion. Mitral Valve: The mitral valve is grossly normal. There is mild thickening of the mitral valve leaflet(s). No evidence of mitral valve regurgitation. Tricuspid Valve: The tricuspid valve is grossly normal. Tricuspid valve regurgitation is mild. Aortic Valve: The aortic valve is tricuspid. . There is mild thickening of the aortic valve. Aortic valve regurgitation is  not visualized. Mild aortic valve sclerosis is present, with no evidence of aortic valve stenosis. There is mild thickening of the aortic valve. Pulmonic Valve: The pulmonic valve was grossly normal. Pulmonic valve regurgitation is not visualized. Pulmonic regurgitation is not visualized. Aorta: The aortic root is normal in size and structure. Venous: The inferior vena cava is normal in size with greater than 50% respiratory variability, suggesting right atrial pressure of 3 mmHg. IAS/Shunts: No atrial level shunt detected by color flow Doppler.  LEFT VENTRICLE PLAX 2D LVIDd:         3.90 cm  Diastology LVIDs:         2.30 cm  LV e' lateral:   5.44 cm/s LV PW:         1.60 cm  LV E/e' lateral: 7.9 LV IVS:        1.40 cm  LV e' medial:    6.64 cm/s LVOT diam:     2.00 cm  LV E/e' medial:  6.5 LV SV:         48 ml LV SV Index:   19.37 LVOT Area:     3.14 cm  RIGHT VENTRICLE RV S prime:     16.00 cm/s TAPSE (M-mode): 1.6 cm LEFT ATRIUM             Index       RIGHT ATRIUM           Index LA diam:        3.90 cm 1.70 cm/m  RA Area:     13.40 cm LA Vol (A2C):   25.4 ml 11.06 ml/m RA Volume:   28.80 ml  12.54 ml/m LA Vol (A4C):   55.5 ml 24.16 ml/m LA Biplane Vol: 37.9 ml 16.50 ml/m   AORTA Ao Root diam: 2.50 cm MITRAL VALVE MV Area (PHT): 4.21 cm             SHUNTS MV PHT:        52.20 msec           Systemic Diam: 2.00 cm MV Decel Time: 180 msec MV E velocity: 43.00 cm/s 103 cm/s MV A velocity: 66.40 cm/s 70.3 cm/s MV E/A ratio:  0.65       1.5  Kate Sable MD Electronically signed by Kate Sable MD Signature Date/Time: 03/20/2019/12:51:02 PM    Final        Assessment/Plan: Diagnosis: 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 Does the need for close, 24 hr/day medical supervision in concert with the patient's rehab needs make it unreasonable for this patient to be served in a less intensive setting? Yes 2. Co-Morbidities requiring supervision/potential complications: DMI, CKD IIIb  with Cr baseline of 2.0, HLD, EF of 30%, PVD, MMSE of 25/30 3. Due to bowel management, safety, skin/wound care, disease management, medication administration, pain management and patient education, does the patient require 24 hr/day rehab nursing? Yes 4. Does the patient require coordinated care of a physician, rehab nurse, therapy  disciplines of PT, OT, possibly SLP to address physical and functional deficits in the context of the above medical diagnosis(es)? Yes Addressing deficits in the following areas: balance, endurance, locomotion, strength, transferring, bathing, dressing, feeding, grooming, toileting and cognition 5. Can the patient actively participate in an intensive therapy program of at least 3 hrs of therapy per day at least 5 days per week? Yes 6. The potential for patient to make measurable gains while on inpatient rehab is good 7. Anticipated functional outcomes upon discharge from inpatient rehab are modified independent and supervision  with PT, modified independent and supervision with OT, supervision with SLP. 8. Estimated rehab length of stay to reach the above functional goals is: 10-14 days 9. Anticipated discharge destination: Home 10. Overall Rehab/Functional Prognosis: good  RECOMMENDATIONS: This patient's condition is appropriate for continued rehabilitative care in the following setting: CIR Patient has agreed to participate in recommended program. Yes Note that insurance prior authorization may be required for reimbursement for recommended care.  Comment:  1. Suggest that pt is a good candidate for inpt rehab- discussed with pt what CIR is, what we do, and how to hopefully improve his level of function with inpt CIR. 2. Due to pt's MMSE of 25/30, might benefit from concrete paperwork discussing CIR and benefits of it- he appeared agreeable.  3. Nystamus might be cause of visual blurriness.    Genice Rouge, MD 03/21/2019

## 2019-03-21 NOTE — Progress Notes (Signed)
STROKE TEAM PROGRESS NOTE    INTERVAL HISTORY Patient is sleeping in bed.  His wife arrived during rounds.  He states his facial numbness and dizziness and right-sided weakness is unchanged.  2D echo showed normal ejection fraction without cardiac source of embolism.  Therapy has seen him and recommended inpatient rehab.  LDL cholesterol is elevated to 71 and hemoglobin A1c 7.0.  He states he has been compliant with using his CPAP every night    OBJECTIVE Vitals:   03/20/19 1937 03/20/19 2343 03/21/19 0350 03/21/19 0817  BP: 110/70 115/73 118/73 104/71  Pulse: 87 88 86 92  Resp: 19 17 17 20   Temp: 99.4 F (37.4 C) 98.6 F (37 C) 98.6 F (37 C) 98.9 F (37.2 C)  TempSrc: Oral   Oral  SpO2: 98% 95% 95% 96%  Weight:      Height:        CBC:  Recent Labs  Lab 03/19/19 1521 03/19/19 1521 03/20/19 0344 03/21/19 0524  WBC 6.3   < > 6.8 6.0  NEUTROABS 4.1  --  3.3  --   HGB 15.1   < > 13.9 13.6  HCT 48.0   < > 42.4 41.5  MCV 85.6   < > 83.3 83.2  PLT 227   < > 190 190   < > = values in this interval not displayed.    Basic Metabolic Panel:  Recent Labs  Lab 03/20/19 0344 03/21/19 0524  NA 139 138  K 3.6 3.1*  CL 105 104  CO2 23 24  GLUCOSE 94 96  BUN 23* 18  CREATININE 2.30* 2.04*  CALCIUM 8.7* 8.3*  MG  --  2.1    Lipid Panel:     Component Value Date/Time   CHOL 264 (H) 03/20/2019 0113   CHOL 361 (H) 06/11/2014 1232   TRIG 162 (H) 03/20/2019 0113   TRIG 246 (H) 06/11/2014 1232   HDL 21 (L) 03/20/2019 0113   HDL 41 06/11/2014 1232   CHOLHDL 12.6 03/20/2019 0113   VLDL 32 03/20/2019 0113   VLDL 49 (H) 06/11/2014 1232   LDLCALC 211 (H) 03/20/2019 0113   LDLCALC 271 (H) 06/11/2014 1232   HgbA1c:  Lab Results  Component Value Date   HGBA1C 7.0 (H) 03/20/2019   Urine Drug Screen:     Component Value Date/Time   LABOPIA NONE DETECTED 03/19/2019 1940   COCAINSCRNUR NONE DETECTED 03/19/2019 1940   COCAINSCRNUR NONE DETECTED 02/12/2017 1632   LABBENZ  POSITIVE (A) 03/19/2019 1940   AMPHETMU NONE DETECTED 03/19/2019 1940   THCU NONE DETECTED 03/19/2019 1940   LABBARB NONE DETECTED 03/19/2019 1940    Alcohol Level     Component Value Date/Time   ETH <10 03/19/2019 1944    IMAGING  MR ANGIO HEAD WO CONTRAST 03/20/2019 IMPRESSION:  1. No large vessel occlusion. Possible occlusion of the right AICA, or it could be diminutive or absent.  2. Severe stenosis versus artifact in the left PCA distal P2/proximal P3 segment.  3. Suggestion of intracranial atherosclerosis involving the anterior and posterior circulation, including mild irregularity of the: distal right vertebral artery, basilar artery, supraclinoid right ICA, and bilateral MCA branches.  4. Non dominant left vertebral artery functionally terminates in PICA. Fetal type bilateral PCA origins.   MR ANGIO NECK WO CONTRAST 03/20/2019 IMPRESSION:  Negative neck MRA. Dominant right vertebral artery.   MR BRAIN/IAC W WO CONTRAST 03/19/2019 IMPRESSION:  Small acute infarction involving the right middle cerebellar peduncle. Corresponding enhancement  may reflect early blood brain barrier disruption. Given presence of vascular risk factors, ischemia is favored over demyelination. There is also a probable small chronic infarct near the right frontal horn.   Transthoracic Echocardiogram  03/20/2019 IMPRESSIONS  1. Left ventricular ejection fraction, by visual estimation, is 55 to  60%. The left ventricle has normal function. There is severely increased  left ventricular hypertrophy.  2. Left ventricular diastolic parameters are consistent with Grade I  diastolic dysfunction (impaired relaxation).  3. The left ventricle has no regional wall motion abnormalities.  4. Global right ventricle has normal systolic function.The right  ventricular size is normal. No increase in right ventricular wall  thickness.  5. Left atrial size was normal.  6. Right atrial size was normal.  7. The  mitral valve is grossly normal. No evidence of mitral valve  regurgitation.  8. The tricuspid valve is grossly normal.  9. The tricuspid valve is grossly normal. Tricuspid valve regurgitation  is mild.  10. The aortic valve is tricuspid. Aortic valve regurgitation is not  visualized. Mild aortic valve sclerosis without stenosis.  11. The pulmonic valve was grossly normal. Pulmonic valve regurgitation is  not visualized.  12. The inferior vena cava is normal in size with greater than 50%  respiratory variability, suggesting right atrial pressure of 3 mmHg.   ECG - SR rate 76 BPM. (See cardiology reading for complete details)   PHYSICAL EXAM Blood pressure 104/71, pulse 92, temperature 98.9 F (37.2 C), temperature source Oral, resp. rate 20, height 5\' 7"  (1.702 m), weight 122.5 kg, SpO2 96 %. Obese young African-American male not in distress. . Afebrile. Head is nontraumatic. Neck is supple without bruit.    Cardiac exam no murmur or gallop. Lungs are clear to auscultation. Distal pulses are well felt.  Neurological Exam :  He is awake alert oriented to time place and person.  Speech and language appear normal.  Extraocular movements are full range without nystagmus but he has marked saccadic dysmetria on lateral gaze bilaterally.  He has significant decreased hearing in the right ear and normal hearing on the left.  He has mild weakness of the right lower greater than upper face.  There is subjective diminished touch pinprick sensation in the right face over the cheeks and jaw.  Tongue is midline.  Motor system exam shows mild right-sided weakness involving hand grip, intrinsic hand muscles and right hip flexors and ankle dorsiflexors.  There is mild right finger-to-nose and knee to heel dysmetria. Gait demonstrates ataxia with dragging of the right leg   ASSESSMENT/PLAN Mr. RYOMA NOFZIGER is a 39 y.o. male with history of diabetes, hypertension, hyperlipidemia, EF 45-50%, CHF and hx of  medical non compliance, presenting with dizziness, gait instability, loss of hearing R ear, facial numbness, nausea and vomiting. He did not receive IV t-PA due to late presentation (>4.5 hours from time of onset)  Stroke: Small acute infarction involving the right middle cerebellar peduncle likely due to small vessel disease.  Resultant right hemiataxia, mild right hemiparesis, right-sided hearing loss and right face numbness  Code Stroke CT Head - not ordered  CT head - not ordered  MRI head W&WO - Small acute infarction involving the right middle cerebellar peduncle. Corresponding enhancement may reflect early blood brain barrier disruption. Given presence of vascular risk factors, ischemia is favored over demyelination. There is also a probable small chronic infarct near the right frontal horn.   MRA head - Possible occlusion of the right AICA,  or it could be diminutive or absent. Severe stenosis versus artifact in the left PCA distal P2/proximal P3 segment. Suggestion of intracranial atherosclerosis involving the anterior and posterior circulation, including mild irregularity of the: distal right vertebral artery, basilar artery, supraclinoid right ICA, and bilateral MCA branches.   CTA H&N - not ordered  CT Perfusion - not ordered  Transcranial doppler bubble study- pending  Carotid Doppler -not needed as MRA neck was unremarkable  2D Echo - EF 55 - 60%. No cardiac source of emboli identified. Severely increased left ventricular hypertrophy.   Sars Corona Virus 2 - negative  LDL - 211  HgbA1c - 7.0  UDS - benzodiazepine  VTE prophylaxis - Lovenox Diet  Diet Order            Diet heart healthy/carb modified Room service appropriate? Yes; Fluid consistency: Thin; Fluid restriction: 1200 mL Fluid  Diet effective now              aspirin 81 mg daily prior to admission, now on aspirin 325 mg daily and clopidogrel 75 mg daily  Patient counseled to be compliant with his  antithrombotic medications  Ongoing aggressive stroke risk factor management  Therapy recommendations:  CIR -> Rehab consult placed  Disposition:  Pending  Hypertension  Home BP meds: Norvasc, Entresto, Coreg  Current BP meds: none   BP mildly low . Permissive hypertension (OK if < 220/120) but gradually normalize in 5-7 days  . Long-term BP goal normotensive  Hyperlipidemia  Home Lipid lowering medication: Lipitor 80 mg daily  LDL 211, goal < 70  Current lipid lowering medication: Lipitor 80 mg daily   Continue statin at discharge  Diabetes  Home diabetic meds: insulin and Jardiance  Current diabetic meds: insulin  HgbA1c 7.0, goal < 7.0  Other Stroke Risk Factors  Previous ETOH use  Obesity, Body mass index is 42.29 kg/m., recommend weight loss, diet and exercise as appropriate   Family hx stroke (father)  Congestive Heart Failure  Other Active Problems  Code status - Full code  CKD - stage 3b  Hypokalemia - 3.1 - supplemented   Hospital day # 2 . He presented with 3-week history of dizziness, right-sided hearing loss and more recent right face numbness likely due to subacute right pontine infarct etiology to be determined but despite his young age he does have multiple vascular risk factors in the form of obesity, diabetes, hyperlipidemia, obstructive sleep apnea.  Recommend dual antiplatelet therapy with aspirin and Plavix for 3 weeks followed by Plavix alone.  Continue aggressive risk factor modification.  Patient counseled to be compliant with using CPAP every night.  Await results of ANA panel, hypercoagulable labs, transcranial Doppler bubble study for PFO.  Physical occupational therapy and he will likely need inpatient rehab.  Discussed with Dr. Lynden Oxford. and patient's wife greater than 50% time during this 25-minute visit was spent in counseling and coordination of care about his stroke and answering questions Delia Heady, MD  To contact  Stroke Continuity provider, please refer to [email or web address]. After hours, contact General Neurology

## 2019-03-21 NOTE — Progress Notes (Signed)
[facility] Progress Note  Patient: Joel [name]    KDX:[id number]  DOA: 03/19/2019     Date of Service: the patient was seen and examined on 03/21/2019  Chief Complaint  Patient presents with  . Numbness   Brief hospital course: Joel [name] is a 39 y.o. male with history of nonischemic cardiomyopathy last EF measured was 30% with grade 2 diastolic dysfunction in 2019 with history of diabetes mellitus type 1, chronic disease stage III hypertension had started experiencing right facial numbness and difficulty getting on the right side for almost 3 weeks now with initial having some vomiting.  Patient was treated for vestibular problems and referred to the ENT who referred patient to the ER given the concern for possible stroke.  Patient during this time also started having ataxia.  Also had some visual blurriness.  Denies any headache fever chills.  ED Course: In the ER patient was found to have mild ataxia.  And was able to move all extremities 5 x 5.  MRI brain shows acute infarct along the right cerebellar peduncle.  Neurologist on-call was consulted.  Patient passed swallow.  Was placed on aspirin Plavix and admitted for further stroke work-up.  EKG shows normal sinus rhythm with IVCD.  Labs show worsening renal function with creatinine of 2.6 baseline is usually around 2.  Was given to 30 cc normal saline bolus.  Covid test was negative. Currently further plan is for the stroke work-up.  Assessment and Plan: 1.  Acute right middle cerebellar CVA After the window.  For TPA. Appreciate neurological assistance. Ischemia is favored over demyelination. MRA concerning for occlusion of the right AICA. Transcranial Dopplers, bubble study, carotid Doppler and 2D echo currently pending. LDL 211 patient will be on Lipitor 80 mg. Hemoglobin A1c 7.0. Passed swallowing evaluation and currently on cardiac diet. PT OT and SLP following. Current recommendation is CIR. Consulted. Plan is to  continue aspirin and Plavix.  2.  Essential hypertension Permissive hypertension for now. Currently holding blood pressure medication.  3.  Severe hyperlipidemia LDL 211. Currently on Lipitor 80 mg daily.  4.  Type 2 diabetes mellitus, uncontrolled with peripheral vascular disease Hemoglobin A1c 7.0. At home on insulin and Jardiance. Currently on sliding scale insulin. Monitor.  5.  Recently diagnosed OSA Patient remains compliant with his CPAP.  6.  Morbid obesity Body mass index is 42.29 kg/m.  Outpatient dietary consultation will be beneficial.  7.  Acute on chronic kidney disease stage IIIb. Patient was given IV fluids per Currently holding antihypertensive medication. Monitor.  8.  Issues with the lower lip piercing. Patient has lip piercing.  He was able to remove it for the MRI on 03/20/2019. He did place the piercing again after the MRI. On Tuesday, 03/03/2019 he wanted to remove the piercing.  The lower lip piercing which screws in it is stuck.  He attempted multiple times to remove it and was not able to remove it and now has some swelling around the site.  Patient's friend who performs piercing came to help and they were not able to remove it as well. My suggestion is to let it heal and let the swelling go down and attempt removal in 24 to 48 hours. Use benzocaine internally and lidocaine externally in the meantime and apply Neosporin to prevent infection. If patient is not able to remove it, he may require surgical consult.  Diet: Cardiac and carb modified DVT Prophylaxis: Subcutaneous Heparin    Advance goals of  care discussion: Full code  Family Communication: family was present at bedside, at the time of interview.  The pt provided permission to discuss medical plan with the family. Opportunity was given to ask question and all questions were answered satisfactorily.   Disposition:  Pt is from home, admitted with acute CVA, still has acute CVA with pending  work-up, which precludes a safe discharge. Discharge to CIR, when medically ready.  Subjective: Still having difficulty ambulating.  Has dizziness and lightheadedness.  No nausea no vomiting.  Has some bleeding with his lip piercing.  Physical Exam: General:  alert oriented to time, place, and person.  Appear in mild distress, affect appropriate Eyes: PERRL ENT: Oral Mucosa Clear, moist  Neck: no JVD,  Cardiovascular: S1 and S2 Present, no Murmur,  Respiratory: good respiratory effort, Bilateral Air entry equal and Decreased, no Crackles, no wheezes Abdomen: Bowel Sound present, Soft and no tenderness,  Skin: no rash Extremities: no Pedal edema, no calf tenderness Neurologic: Difficulty hearing of the right side Gait not checked due to patient safety concerns  Vitals:   03/20/19 2343 03/21/19 0350 03/21/19 0817 03/21/19 1557  BP: 115/73 118/73 104/71 111/79  Pulse: 88 86 92 91  Resp: 17 17 20 20   Temp: 98.6 F (37 C) 98.6 F (37 C) 98.9 F (37.2 C) 98.4 F (36.9 C)  TempSrc:   Oral Oral  SpO2: 95% 95% 96% 95%  Weight:      Height:        Intake/Output Summary (Last 24 hours) at 03/21/2019 1856 Last data filed at 03/21/2019 1429 Gross per 24 hour  Intake 240 ml  Output 300 ml  Net -60 ml   Filed Weights   03/19/19 1514  Weight: 122.5 kg    Data Reviewed: I have personally reviewed and interpreted daily labs, tele strips, imagings as discussed above. I reviewed all nursing notes, pharmacy notes, vitals, pertinent old records I have discussed plan of care as described above with RN and patient/family.  CBC: Recent Labs  Lab 03/19/19 1521 03/20/19 0344 03/21/19 0524  WBC 6.3 6.8 6.0  NEUTROABS 4.1 3.3  --   HGB 15.1 13.9 13.6  HCT 48.0 42.4 41.5  MCV 85.6 83.3 83.2  PLT 227 190 824   Basic Metabolic Panel: Recent Labs  Lab 03/19/19 1521 03/20/19 0344 03/21/19 0524  NA 137 139 138  K 4.0 3.6 3.1*  CL 101 105 104  CO2 23 23 24   GLUCOSE 214* 94 96  BUN  22* 23* 18  CREATININE 2.63* 2.30* 2.04*  CALCIUM 9.1 8.7* 8.3*  MG  --   --  2.1    Studies: No results found.  Scheduled Meds: . aspirin  300 mg Rectal Daily   Or  . aspirin  325 mg Oral Daily  . atorvastatin  80 mg Oral q1800  . clopidogrel  75 mg Oral Daily  . enoxaparin (LOVENOX) injection  40 mg Subcutaneous Q24H  . insulin aspart  0-9 Units Subcutaneous TID WC  . insulin detemir  45 Units Subcutaneous BID  . neomycin-bacitracin-polymyxin   Topical BID  . pantoprazole  40 mg Oral Daily   Continuous Infusions: PRN Meds: acetaminophen **OR** acetaminophen (TYLENOL) oral liquid 160 mg/5 mL **OR** acetaminophen, benzocaine, lidocaine  Time spent: 35 minutes  Author: Berle Mull, MD [facility] 03/21/2019 6:56 PM  To reach On-call, see care teams to locate the attending and reach out to them via [email or web address]. If 7PM-7AM, please contact night-coverage If you still  have difficulty reaching the attending provider, please page the [facility] (Director on Call) for [facility] on amion for assistance.

## 2019-03-21 NOTE — Progress Notes (Signed)
Lip piercing has still not been removed due to lip swelling. Dr. Allena Katz notified. Joel [name]

## 2019-03-22 ENCOUNTER — Inpatient Hospital Stay (HOSPITAL_COMMUNITY): Payer: Medicaid Other

## 2019-03-22 DIAGNOSIS — I6302 Cerebral infarction due to thrombosis of basilar artery: Secondary | ICD-10-CM

## 2019-03-22 DIAGNOSIS — I5022 Chronic systolic (congestive) heart failure: Secondary | ICD-10-CM

## 2019-03-22 DIAGNOSIS — E782 Mixed hyperlipidemia: Secondary | ICD-10-CM

## 2019-03-22 LAB — CBC
HCT: 40.2 % (ref 39.0–52.0)
Hemoglobin: 13.3 g/dL (ref 13.0–17.0)
MCH: 27.3 pg (ref 26.0–34.0)
MCHC: 33.1 g/dL (ref 30.0–36.0)
MCV: 82.4 fL (ref 80.0–100.0)
Platelets: 211 10*3/uL (ref 150–400)
RBC: 4.88 MIL/uL (ref 4.22–5.81)
RDW: 15.7 % — ABNORMAL HIGH (ref 11.5–15.5)
WBC: 5.2 10*3/uL (ref 4.0–10.5)
nRBC: 0 % (ref 0.0–0.2)

## 2019-03-22 LAB — BASIC METABOLIC PANEL
Anion gap: 11 (ref 5–15)
BUN: 14 mg/dL (ref 6–20)
CO2: 24 mmol/L (ref 22–32)
Calcium: 8.7 mg/dL — ABNORMAL LOW (ref 8.9–10.3)
Chloride: 104 mmol/L (ref 98–111)
Creatinine, Ser: 2.12 mg/dL — ABNORMAL HIGH (ref 0.61–1.24)
GFR calc Af Amer: 45 mL/min — ABNORMAL LOW (ref >60)
GFR calc non Af Amer: 39 mL/min — ABNORMAL LOW (ref >60)
Glucose, Bld: 120 mg/dL — ABNORMAL HIGH (ref 70–99)
Potassium: 3.4 mmol/L — ABNORMAL LOW (ref 3.5–5.1)
Sodium: 139 mmol/L (ref 135–145)

## 2019-03-22 LAB — ANTIPHOSPHOLIPID SYNDROME EVAL, BLD
Anticardiolipin IgA: <9 APL U/mL (ref 0–11)
Anticardiolipin IgG: <9 GPL U/mL (ref 0–14)
Anticardiolipin IgM: 10 MPL U/mL (ref 0–12)
DRVVT: 50.2 s — ABNORMAL HIGH (ref 0.0–47.0)
PTT Lupus Anticoagulant: 51.2 s (ref 0.0–51.9)
Phosphatydalserine, IgA: 2 APS IgA (ref 0–20)
Phosphatydalserine, IgG: 3 GPS IgG (ref 0–11)
Phosphatydalserine, IgM: 12 MPS IgM (ref 0–25)

## 2019-03-22 LAB — DRVVT MIX: dRVVT Mix: 43 s — ABNORMAL HIGH (ref 0.0–40.4)

## 2019-03-22 LAB — GLUCOSE, CAPILLARY
Glucose-Capillary: 113 mg/dL — ABNORMAL HIGH (ref 70–99)
Glucose-Capillary: 99 mg/dL (ref 70–99)
Glucose-Capillary: 103 mg/dL — ABNORMAL HIGH (ref 70–99)
Glucose-Capillary: 142 mg/dL — ABNORMAL HIGH (ref 70–99)
Glucose-Capillary: 96 mg/dL (ref 70–99)

## 2019-03-22 LAB — ANA W/REFLEX IF POSITIVE: Anti Nuclear Antibody (ANA): NEGATIVE

## 2019-03-22 LAB — DRVVT CONFIRM: dRVVT Confirm: 1.3 ratio — ABNORMAL HIGH (ref 0.8–1.2)

## 2019-03-22 MED ORDER — POVIDONE-IODINE 10 % EX SOLN
1.0000 "application " | CUTANEOUS | Status: DC | PRN
  Administered 2019-03-22: 1 via TOPICAL
  Filled 2019-03-22: qty 118

## 2019-03-22 MED ORDER — INSULIN DETEMIR 100 UNIT/ML ~~LOC~~ SOLN
45.0000 [IU] | Freq: Every day | SUBCUTANEOUS | Status: DC
  Filled 2019-03-22: qty 0.45

## 2019-03-22 NOTE — Progress Notes (Signed)
PROGRESS NOTE    Joel [name]  IRC:[id number]  DOB: 1980-06-28  PCP: [name], DO Admit date:03/19/2019  [phone] y.o.malewithh/o NICM, last EF 02%, grade 2 diastolic dysfunction, DM, CKD stage III, HTN presented with c/o right facial numbness and difficulty getting on the right side for almost 3 weeks with vomiting. Patient was treated for vestibular problems and referred to the ENT who in turn referred patient for stroke eval. Patient also reports ataxia and some visual blurriness. ED Course: Afebrile,mild ataxia, strength intact. MRI brain -acute infarct along right cerebellar peduncle. Neurologist on-call was consulted who felt patient out of window for tPA. Patient passed swallow. Started on aspirin/ Plavix.EKG- normal sinus rhythm with IVCD. Labs- worsening renal function with creatinine of 2.6 (baseline ~2).Covid screen negative. Hospital course: Patient admitted to [facility] for further stroke work-up.   Subjective:  Still having balance issues.  BG in 90s.  Asymptomatic.  He is mainly concerned about pain in his chin/below lower lip due to embedded lip piercing.  Wants it to be taken out.  Neurology at bedside.  Objective: Vitals:   03/21/19 1933 03/21/19 2329 03/22/19 0324 03/22/19 0943  BP: 127/87 112/75 112/76 118/80  Pulse: 79 79 81 89  Resp: 16 16 18 14   Temp: 98.2 F (36.8 C) 97.7 F (36.5 C) 97.9 F (36.6 C) 98.5 F (36.9 C)  TempSrc: Oral Oral Oral Oral  SpO2: 100% 95% 98% 96%  Weight:      Height:        Intake/Output Summary (Last 24 hours) at 03/22/2019 1114 Last data filed at 03/21/2019 2130 Gross per 24 hour  Intake 360 ml  Output --  Net 360 ml   Filed Weights   03/19/19 1514  Weight: 122.5 kg    Physical Examination:  General exam: Appears calm and comfortable.  Noted to have small scab at the site of lip piercing/ Respiratory system: Clear to auscultation. Respiratory effort normal. Cardiovascular system: S1 & S2 heard, RRR. No JVD,  murmurs, rubs, gallops or clicks. No pedal edema. Gastrointestinal system: Abdomen is nondistended, soft and nontender. Normal bowel sounds heard. Central nervous system: Alert and oriented. No focal weakness or sensory deficits in extremities.  Finger-to-nose intact.  Gait not tested but patient reports persistent ataxia.Marland Kitchen Extremities: No contractures, edema or joint deformities.  Skin: No rashes, lesions or ulcers Psychiatry: Judgement and insight appear normal. Mood & affect appropriate.   Data Reviewed: I have personally reviewed following labs and imaging studies  CBC: Recent Labs  Lab 03/19/19 1521 03/20/19 0344 03/21/19 0524 03/22/19 0151  WBC 6.3 6.8 6.0 5.2  NEUTROABS 4.1 3.3  --   --   HGB 15.1 13.9 13.6 13.3  HCT 48.0 42.4 41.5 40.2  MCV 85.6 83.3 83.2 82.4  PLT 227 190 190 585   Basic Metabolic Panel: Recent Labs  Lab 03/19/19 1521 03/20/19 0344 03/21/19 0524 03/22/19 0151  NA 137 139 138 139  K 4.0 3.6 3.1* 3.4*  CL 101 105 104 104  CO2 23 23 24 24   GLUCOSE 214* 94 96 120*  BUN 22* 23* 18 14  CREATININE 2.63* 2.30* 2.04* 2.12*  CALCIUM 9.1 8.7* 8.3* 8.7*  MG  --   --  2.1  --    GFR: Estimated Creatinine Clearance: 59.9 mL/min (A) (by C-G formula based on SCr of 2.12 mg/dL (H)). Liver Function Tests: Recent Labs  Lab 03/19/19 1521 03/20/19 0344  AST 19 17  ALT 22 17  ALKPHOS 72 60  BILITOT 1.1 1.1  PROT 6.7 6.1*  ALBUMIN 3.3* 2.9*   No results for input(s): LIPASE, AMYLASE in the last 168 hours. No results for input(s): AMMONIA in the last 168 hours. Coagulation Profile: No results for input(s): INR, PROTIME in the last 168 hours. Cardiac Enzymes: No results for input(s): CKTOTAL, CKMB, CKMBINDEX, TROPONINI in the last 168 hours. BNP (last 3 results) No results for input(s): PROBNP in the last 8760 hours. HbA1C: Recent Labs    03/20/19 0113  HGBA1C 7.0*   CBG: Recent Labs  Lab 03/21/19 0606 03/21/19 1157 03/21/19 1530  03/21/19 2116 03/22/19 0632  GLUCAP 86 144* 119* 128* 99   Lipid Profile: Recent Labs    03/20/19 0113  CHOL 264*  HDL 21*  LDLCALC 211*  TRIG 162*  CHOLHDL 12.6   Thyroid Function Tests: No results for input(s): TSH, T4TOTAL, FREET4, T3FREE, THYROIDAB in the last 72 hours. Anemia Panel: No results for input(s): VITAMINB12, FOLATE, FERRITIN, TIBC, IRON, RETICCTPCT in the last 72 hours. Sepsis Labs: No results for input(s): PROCALCITON, LATICACIDVEN in the last 168 hours.  Recent Results (from the past 240 hour(s))  SARS CORONAVIRUS 2 (TAT 6-24 HRS) Nasopharyngeal Nasopharyngeal Swab     Status: None   Collection Time: 03/19/19  6:50 PM   Specimen: Nasopharyngeal Swab  Result Value Ref Range Status   SARS Coronavirus 2 NEGATIVE NEGATIVE Final    Comment: (NOTE) SARS-CoV-2 target nucleic acids are NOT DETECTED. The SARS-CoV-2 RNA is generally detectable in upper and lower respiratory specimens during the acute phase of infection. Negative results do not preclude SARS-CoV-2 infection, do not rule out co-infections with other pathogens, and should not be used as the sole basis for treatment or other patient management decisions. Negative results must be combined with clinical observations, patient history, and epidemiological information. The expected result is Negative. Fact Sheet for Patients: HairSlick.no Fact Sheet for Healthcare Providers: quierodirigir.com This test is not yet approved or cleared by the [place] FDA and  has been authorized for detection and/or diagnosis of SARS-CoV-2 by FDA under an Emergency Use Authorization (EUA). This EUA will remain  in effect (meaning this test can be used) for the duration of the COVID-19 declaration under Section 56 4(b)(1) of the Act, 21 U.S.C. section 360bbb-3(b)(1), unless the authorization is terminated or revoked sooner. Performed at [facility],  [address] [place], [place] [address]       Radiology Studies: No results found.      Scheduled Meds: . aspirin  300 mg Rectal Daily   Or  . aspirin  325 mg Oral Daily  . atorvastatin  80 mg Oral q1800  . clopidogrel  75 mg Oral Daily  . enoxaparin (LOVENOX) injection  40 mg Subcutaneous Q24H  . insulin aspart  0-9 Units Subcutaneous TID WC  . insulin detemir  45 Units Subcutaneous BID  . neomycin-bacitracin-polymyxin   Topical BID  . pantoprazole  40 mg Oral Daily   Continuous Infusions:  Assessment & Plan:   1.  Acute right middle cerebellar CVA-Appreciate neurology assistance. Ischemia is favored over demyelination.MRA concerning for occlusion of the right AICA.Transcranial Dopplers, bubble study, carotid Doppler and 2D echo currently pendingPlan is to continue aspirin and Plavix.Marland KitchenLDL 211- patient on Lipitor 80 mg.Hemoglobin A1c 7.0. Passed swallowing evaluation and currently on cardiac diet.PT OT following. Current recommendation is CIR.  2.  Essential hypertension- blood pressure medications held for permissive HTN.  3. Hyperlipidemia:LDL 211.Currently on Lipitor 80 mg daily.  4.  Type 2 diabetes mellitus, uncontrolled with peripheral vascular disease-Hemoglobin A1c 7.0.On levemir bid, Jardiance and SSI. BG running low last few day--change to qhs insulin.  5.  Recently diagnosed OSA-Patient remains compliant with his CPAP.  6.  Morbid obesity-Body mass index is 42.29 kg/m.  Outpatient dietary consultation will be beneficial.  7.  Acute on chronic kidney disease stage IIIb.Patient was given IV fluids on presentation. Currently holding antihypertensive medication.Monitor.  8.  Issues with the lower lip piercing.Patient has lip piercing.  He was able to remove it for the MRI on 03/20/2019.He did place the piercing again after the MRI.On Tuesday, 03/03/2019 he wanted to remove the piercing.  The lower lip piercing which screws in it is stuck.  He attempted multiple  times to remove it and was not able to remove it and now has some swelling around the site.  Patient's friend who performs piercing came to help and they were not able to remove it as well.  Currently using benzocaine internally and lidocaine externally with Neosporin to prevent infection. No fluctuant swelling but reports mild pustular drainage earlier today-still not able to remove it, he may need incision and extraction.  Discussed with ENT who recommended to try Betadine and local antibiotic cream for now.    DVT prophylaxis: Lovenox Code Status: Full code Family / Patient Communication: Discussed with patient and neurologist at bedside Disposition Plan: CIR evaluation pending     LOS: 3 days    Time spent: 25 minutes    Alessandra Bevels, MD [facility] Pager in [place]  If 7PM-7AM, please contact night-coverage [email or web address] Password [email or web address] 03/22/2019, 11:14 AM

## 2019-03-22 NOTE — Progress Notes (Signed)
STROKE TEAM PROGRESS NOTE   INTERVAL HISTORY Dr. Lajuana Ripple is at bedside. He still complains of right facial numbness and right arm mild weakness. Denies dizziness, N/V lying in bed. CIR pending.    OBJECTIVE Vitals:   03/21/19 2329 03/22/19 0324 03/22/19 0943 03/22/19 1144  BP: 112/75 112/76 118/80 (!) 125/100  Pulse: 79 81 89 97  Resp: 16 18 14 18   Temp: 97.7 F (36.5 C) 97.9 F (36.6 C) 98.5 F (36.9 C) 98.5 F (36.9 C)  TempSrc: Oral Oral Oral Oral  SpO2: 95% 98% 96% 99%  Weight:      Height:       CBC:  Recent Labs  Lab 03/19/19 1521 03/19/19 1521 03/20/19 0344 03/20/19 0344 03/21/19 0524 03/22/19 0151  WBC 6.3   < > 6.8   < > 6.0 5.2  NEUTROABS 4.1  --  3.3  --   --   --   HGB 15.1   < > 13.9   < > 13.6 13.3  HCT 48.0   < > 42.4   < > 41.5 40.2  MCV 85.6   < > 83.3   < > 83.2 82.4  PLT 227   < > 190   < > 190 211   < > = values in this interval not displayed.   Basic Metabolic Panel:  Recent Labs  Lab 03/21/19 0524 03/22/19 0151  NA 138 139  K 3.1* 3.4*  CL 104 104  CO2 24 24  GLUCOSE 96 120*  BUN 18 14  CREATININE 2.04* 2.12*  CALCIUM 8.3* 8.7*  MG 2.1  --    Lipid Panel:     Component Value Date/Time   CHOL 264 (H) 03/20/2019 0113   CHOL 361 (H) 06/11/2014 1232   TRIG 162 (H) 03/20/2019 0113   TRIG 246 (H) 06/11/2014 1232   HDL 21 (L) 03/20/2019 0113   HDL 41 06/11/2014 1232   CHOLHDL 12.6 03/20/2019 0113   VLDL 32 03/20/2019 0113   VLDL 49 (H) 06/11/2014 1232   LDLCALC 211 (H) 03/20/2019 0113   LDLCALC 271 (H) 06/11/2014 1232   HgbA1c:  Lab Results  Component Value Date   HGBA1C 7.0 (H) 03/20/2019   Urine Drug Screen:     Component Value Date/Time   LABOPIA NONE DETECTED 03/19/2019 1940   COCAINSCRNUR NONE DETECTED 03/19/2019 1940   COCAINSCRNUR NONE DETECTED 02/12/2017 1632   LABBENZ POSITIVE (A) 03/19/2019 1940   AMPHETMU NONE DETECTED 03/19/2019 1940   THCU NONE DETECTED 03/19/2019 1940   LABBARB NONE DETECTED 03/19/2019  1940    Alcohol Level     Component Value Date/Time   ETH <10 03/19/2019 1944    IMAGING  MR ANGIO HEAD WO CONTRAST 03/20/2019 IMPRESSION:  1. No large vessel occlusion. Possible occlusion of the right AICA, or it could be diminutive or absent.  2. Severe stenosis versus artifact in the left PCA distal P2/proximal P3 segment.  3. Suggestion of intracranial atherosclerosis involving the anterior and posterior circulation, including mild irregularity of the: distal right vertebral artery, basilar artery, supraclinoid right ICA, and bilateral MCA branches.  4. Non dominant left vertebral artery functionally terminates in PICA. Fetal type bilateral PCA origins.   MR ANGIO NECK WO CONTRAST 03/20/2019 IMPRESSION:  Negative neck MRA. Dominant right vertebral artery.   MR BRAIN/IAC W WO CONTRAST 03/19/2019 IMPRESSION:  Small acute infarction involving the right middle cerebellar peduncle. Corresponding enhancement may reflect early blood brain barrier disruption. Given presence of vascular risk  factors, ischemia is favored over demyelination. There is also a probable small chronic infarct near the right frontal horn.   Transthoracic Echocardiogram  03/20/2019 IMPRESSIONS  1. Left ventricular ejection fraction, by visual estimation, is 55 to 60%. The left ventricle has normal function. There is severely increased left ventricular hypertrophy.  2. Left ventricular diastolic parameters are consistent with Grade I diastolic dysfunction (impaired relaxation).  3. The left ventricle has no regional wall motion abnormalities.  4. Global right ventricle has normal systolic function.The right ventricular size is normal. No increase in right ventricular wall thickness.  5. Left atrial size was normal.  6. Right atrial size was normal.  7. The mitral valve is grossly normal. No evidence of mitral valve regurgitation.  8. The tricuspid valve is grossly normal.  9. The tricuspid valve is grossly  normal. Tricuspid valve regurgitation is mild.  10. The aortic valve is tricuspid. Aortic valve regurgitation is not visualized. Mild aortic valve sclerosis without stenosis.  11. The pulmonic valve was grossly normal. Pulmonic valve regurgitation is not visualized.  12. The inferior vena cava is normal in size with greater than 50% respiratory variability, suggesting right atrial pressure of 3 mmHg.  TCD w/ bubble 03/22/2019 Negative for PFO  ECG - SR rate 76 BPM. (See cardiology reading for complete details)  PHYSICAL EXAM  Blood pressure (!) 125/100, pulse 97, temperature 98.5 F (36.9 C), temperature source Oral, resp. rate 18, height 5\' 7"  (1.702 m), weight 122.5 kg, SpO2 99 %.   Obese young African-American male not in distress. . Afebrile. Head is nontraumatic. Neck is supple without bruit.    Cardiac exam no murmur or gallop. Lungs are clear to auscultation. Distal pulses are well felt.  Neurological Exam :  He is awake alert oriented to time place and person.  Speech and language appear normal.  Extraocular movements are full range without nystagmus but he has marked saccadic dysmetria on lateral gaze bilaterally.  He has significant decreased hearing in the right ear and normal hearing on the left.  He has decreased light touch sensation of the right lower greater than upper face, about 50% of the left.  Tongue is midline.  Motor system exam shows mild right-sided weakness involving hand grip, intrinsic hand muscles and right hip flexors and ankle dorsiflexors, some giveaway weakness noted.  There is mild right finger-to-nose and knee to heel dysmetria. Gait not tested today.    ASSESSMENT/PLAN Mr. CHEVEZ SAMBRANO is a 39 y.o. male with history of diabetes, hypertension, hyperlipidemia, EF 45-50%, CHF and hx of medical non compliance, presenting with dizziness, gait instability, loss of hearing R ear, facial numbness, nausea and vomiting. He did not receive IV t-PA due to late  presentation (>4.5 hours from time of onset)  Stroke: Small acute right middle cerebellar peduncle infarction due to large vessel disease with right AICA occlusion.  Resultant right hemiataxia, mild right hemiparesis, right-sided hearing loss and right face numbness  CT no acute abnormalities  MRI head W&WO - Small acute infarction involving the right middle cerebellar peduncle. Corresponding enhancement may reflect early blood brain barrier disruption. Given presence of vascular risk factors, ischemia is favored over demyelination. There is also a probable small chronic infarct near the right frontal horn.   MRA head - Possible occlusion of the right AICA, or it could be diminutive or absent. Severe stenosis versus artifact in the left PCA distal P2/proximal P3 segment. Suggestion of intracranial atherosclerosis involving the anterior and posterior circulation, including  mild irregularity of the: distal right vertebral artery, basilar artery, supraclinoid right ICA, and bilateral MCA branches.   TCD bubble study - no PFO  2D Echo - EF 55 - 60%. No cardiac source of emboli identified. Severely increased left ventricular hypertrophy.   Sars Corona Virus 2 - negative  LDL - 211  HgbA1c - 7.0  Hypercoagulable labs pending   UDS - benzodiazepine  VTE prophylaxis - Lovenox 40  aspirin 81 mg daily prior to admission, now on aspirin 325 mg daily and clopidogrel 75 mg daily. Continue DAPT for 3 months and then plavix alone given intracranial stenosis  Therapy recommendations:  CIR -> Rehab consult->Pt prefers to go home  Disposition:  Pending  Hypertension  Home BP meds: Norvasc, Entresto, Coreg  Current BP meds: none   BP mildly low . Gradually normalize BP in 2-3 days  . Long-term BP goal normotensive  Hyperlipidemia  Home Lipid lowering medication: Lipitor 80 mg daily  LDL 211, goal < 70  Current lipid lowering medication: Lipitor 80 mg daily   Continue statin at  discharge  Medication compliance education provided  Diabetes type II  Home diabetic meds: insulin and Jardiance  Current diabetic meds: Levemir   HgbA1c 7.0, goal < 7.0  SSI    CBG monitoring  Close PCP follow up  Cardiomyopathy / CHF  02/2017 EF 25-30%  On entresto  01/2019 EF 55-60%  This admission EF 55-60%  Continue follow up with cardiology  Other Stroke Risk Factors  Previous ETOH use  Morbid Obesity, Body mass index is 42.29 kg/m., recommend weight loss, diet and exercise as appropriate   Family hx stroke (father)  obstructive sleep apnea on CPAP  Other Active Problems  AKI on CKD - stage 3b  Hypokalemia - 3.1 - supplemented - 3.4   Lower lip piercing swelling following removal and reinsertion - Dr. Earnest Conroy aware - consider ENT consult  Hospital day # 3  Neurology will sign off. Please call with questions. Pt will follow up with stroke clinic NP at [facility] in about 4 weeks. Thanks for the consult.  Rosalin Hawking, MD PhD Stroke Neurology 03/22/2019 2:55 PM    To contact Stroke Continuity provider, please refer to [email or web address] After hours, contact General Neurology

## 2019-03-22 NOTE — Progress Notes (Signed)
Inpatient Rehab Admissions:  Inpatient Rehab Consult received.  I met with patient at the bedside for rehabilitation assessment and to discuss goals and expectations of an inpatient rehab admission.  At this time he is preferring to go home with either home health or outpatient follow up.  Noted that he would need assist with ambulation due to balance impairments, esp with direction changes, and he thinks his wife and brother in law will be able to provide this.  Left CIR pamphlet with my contact information for pt to call me if he changes his mind.    Signed: Shann Medal, PT, DPT Admissions Coordinator [phone] 03/22/19  11:43 AM

## 2019-03-22 NOTE — Plan of Care (Signed)
  Problem: Education: Goal: Knowledge of disease or condition will improve Outcome: Progressing Goal: Knowledge of secondary prevention will improve Outcome: Progressing Goal: Knowledge of patient specific risk factors addressed and post discharge goals established will improve Outcome: Progressing   Problem: Health Behavior/Discharge Planning: Goal: Ability to manage health-related needs will improve Outcome: Progressing   Problem: Self-Care: Goal: Ability to participate in self-care as condition permits will improve Outcome: Progressing   Problem: Ischemic Stroke/TIA Tissue Perfusion: Goal: Complications of ischemic stroke/TIA will be minimized Outcome: Progressing   Problem: Education: Goal: Knowledge of General Education information will improve Description: Including pain rating scale, medication(s)/side effects and non-pharmacologic comfort measures Outcome: Progressing   Problem: Health Behavior/Discharge Planning: Goal: Ability to manage health-related needs will improve Outcome: Progressing   Problem: Clinical Measurements: Goal: Ability to maintain clinical measurements within normal limits will improve Outcome: Progressing Goal: Will remain free from infection Outcome: Progressing Goal: Diagnostic test results will improve Outcome: Progressing Goal: Respiratory complications will improve Outcome: Progressing Goal: Cardiovascular complication will be avoided Outcome: Progressing   Problem: Activity: Goal: Risk for activity intolerance will decrease Outcome: Progressing   Problem: Coping: Goal: Level of anxiety will decrease Outcome: Progressing   Problem: Elimination: Goal: Will not experience complications related to bowel motility Outcome: Progressing Goal: Will not experience complications related to urinary retention Outcome: Progressing   Problem: Pain Managment: Goal: General experience of comfort will improve Outcome: Progressing   Problem:  Safety: Goal: Ability to remain free from injury will improve Outcome: Progressing   Problem: Skin Integrity: Goal: Risk for impaired skin integrity will decrease Outcome: Progressing   Problem: Coping: Goal: Will verbalize positive feelings about self Outcome: Not Progressing Goal: Will identify appropriate support needs Outcome: Not Progressing   Problem: Nutrition: Goal: Adequate nutrition will be maintained Outcome: Not Progressing

## 2019-03-22 NOTE — TOC Initial Note (Signed)
Transition of Care [facility]) - Initial/Assessment Note    Patient Details  Name: Joel [name] MRN: [id number] Date of Birth: 07-03-1980  Transition of Care [facility]) CM/SW Contact:    Pollie Friar, RN Phone Number: 03/22/2019, 3:41 PM  Clinical Narrative:                 Recommendations for outpatient therapy. CM met with the patient and he prefers the [place] area. Orders in Epic and information on the AVS.  Pt denies issues with home meds or with transportation.  TOC following for further d/c needs.   Expected Discharge Plan: OP Rehab Barriers to Discharge: Continued Medical Work up   Patient Goals and CMS Choice     Choice offered to / list presented to : Patient  Expected Discharge Plan and Services Expected Discharge Plan: OP Rehab   Discharge Planning Services: CM Consult   Living arrangements for the past 2 months: Single Family Home                                      Prior Living Arrangements/Services Living arrangements for the past 2 months: Single Family Home Lives with:: Spouse Patient language and need for interpreter reviewed:: Yes Do you feel safe going back to the place where you live?: Yes      Need for Family Participation in Patient Care: No (Comment) Care giver support system in place?: Yes (comment)   Criminal Activity/Legal Involvement Pertinent to Current Situation/Hospitalization: No - Comment as needed  Activities of Daily Living Home Assistive Devices/Equipment: None ADL Screening (condition at time of admission) Patient's cognitive ability adequate to safely complete daily activities?: Yes Is the patient deaf or have difficulty hearing?: Yes Does the patient have difficulty seeing, even when wearing glasses/contacts?: No Does the patient have difficulty concentrating, remembering, or making decisions?: No Patient able to express need for assistance with ADLs?: No Does the patient have difficulty dressing or bathing?:  No Independently performs ADLs?: Yes (appropriate for developmental age) Does the patient have difficulty walking or climbing stairs?: No Weakness of Legs: None Weakness of Arms/Hands: None  Permission Sought/Granted                  Emotional Assessment Appearance:: Appears stated age Attitude/Demeanor/Rapport: Engaged Affect (typically observed): Accepting Orientation: : Oriented to Self, Oriented to Place, Oriented to  Time, Oriented to Situation   Psych Involvement: No (comment)  Admission diagnosis:  Slurred speech [R47.81] Ataxia [R27.0] Acute CVA (cerebrovascular accident) [facility]) [I63.9] Cerebrovascular accident (CVA), unspecified mechanism ([place]) [I63.9] Patient Active Problem List   Diagnosis Date Noted  . Acute CVA (cerebrovascular accident) ([place]) 03/19/2019  . Type 1 diabetes mellitus with stage 3 chronic kidney disease ([place]) 05/21/2018  . Depression, major, recurrent, moderate ([place]) 12/19/2017  . Fatigue 04/07/2017  . Dilated cardiomyopathy ([place]) 02/23/2017  . Hypertensive heart disease with heart failure ([place]) 02/23/2017  . Essential hypertension 02/21/2017  . Uncontrolled type 2 diabetes mellitus with nephropathy ([place]) 02/21/2017  . H/O medication noncompliance 02/12/2017  . Chronic kidney disease (CKD), stage III (moderate) 02/12/2017  . Systolic CHF, chronic ([place]) 10/11/2014   PCP:  [name], DO Pharmacy:   [facility] [address] [place] [address] [place]) [place] [address] Phone: [phone] Fax: [phone]     Social Determinants of Health (SDOH) Interventions    Readmission  Risk Interventions No flowsheet data found.

## 2019-03-22 NOTE — Progress Notes (Addendum)
Physical Therapy Treatment Patient Details Name: Joel [name] MRN: [id number] DOB: [birth year] Today's Date: 03/22/2019    History of Present Illness 39 yo male with onset of dizziness and LOB was admitted, had R facial numbness, mult falls, ataxia, dizziness.  Dx with acute infarct along R cerebellar peduncle.  Has acute kidney disease, intracranial atherosclerosis and HLD.  PMHx:  atheroslcerosis, anxiety, CHF, DM, obesity, stroke, EF 45-50%, sleep apnea, HTN, CKD3,     PT Comments    Patient received lying in bed, agrees to PT/OT session. Patient reports continued dizziness and right sided facial pain. He demonstrates independence with bed mobility. Patient continues with right sided weakness, decreased motor planning on right. He demonstrates improved balance and tolerance with activity and requires supervision/min guard for safety. He will continue to benefit from skilled PT while here and will benefit from outpatient PT at discharge to improve function.         Follow Up Recommendations  Outpatient PT     Equipment Recommendations  Other (comment)(TBD, may benefit from cane if needed)    Recommendations for Other Services       Precautions / Restrictions Precautions Precautions: Fall Precaution Comments: dizzy upon standing and at times sitting Restrictions Weight Bearing Restrictions: No    Mobility  Bed Mobility Overal bed mobility: Modified Independent Bed Mobility: Supine to Sit     Supine to sit: Modified independent (Device/Increase time)     General bed mobility comments: no physical assist needed with supine to sit.  Transfers Overall transfer level: Needs assistance Equipment used: None Transfers: Sit to/from Stand Sit to Stand: Supervision;Min guard         General transfer comment: min guard for safety  Ambulation/Gait Ambulation/Gait assistance: Min guard Gait Distance (Feet): 75 Feet Assistive device: None Gait Pattern/deviations: Step-through  pattern Gait velocity: reduced   General Gait Details: improved mobility from last session. No LOB, reports dizziness, however he is able to ambulate with min guard. Demonstrates decreased coordination of right LE. Reports it feels numb.   Stairs             Wheelchair Mobility    Modified Rankin (Stroke Patients Only) Modified Rankin (Stroke Patients Only) Pre-Morbid Rankin Score: No symptoms Modified Rankin: Moderately severe disability     Balance Overall balance assessment: Needs assistance Sitting-balance support: Feet supported Sitting balance-Leahy Scale: Good Sitting balance - Comments: supervision    Standing balance support: During functional activity;No upper extremity supported Standing balance-Leahy Scale: Fair Standing balance comment: requires close supervision/min guard                            Cognition Arousal/Alertness: Awake/alert Behavior During Therapy: Flat affect Overall Cognitive Status: Within Functional Limits for tasks assessed                                 General Comments: Patient with very flat affect, answers questions with one word answers, not very expressive about what is going on.      Exercises General Exercises - Upper Extremity Shoulder Flexion: AROM;Right;10 reps;Seated Shoulder Horizontal ABduction: AROM;Left;10 reps;Seated Shoulder Horizontal ADduction: AROM;Left;10 reps;Seated Other Exercises Other Exercises: seated right LE: LAQ x10, marching x 10, holding right LE out straight with Abd/add x 3 reps ( more difficult)    General Comments General comments (skin integrity, edema, etc.): mild dizziness throughout session > with standing  compared to sitting, educated on gaze stabilization but poor carryover throughout session; requires constant questioning on dizziness status       Pertinent Vitals/Pain Pain Assessment: Faces Faces Pain Scale: Hurts even more Pain Location: right side of his  face Pain Descriptors / Indicators: Grimacing Pain Intervention(s): Monitored during session    Home Living                      Prior Function            PT Goals (current goals can now be found in the care plan section) Acute Rehab PT Goals Patient Stated Goal: to be able to ride motorcycle  PT Goal Formulation: With patient Time For Goal Achievement: 04/03/19 Potential to Achieve Goals: Fair    Frequency    Min 3X/week      PT Plan Discharge plan needs to be updated    Co-evaluation   Reason for Co-Treatment: For patient/therapist safety;To address functional/ADL transfers   OT goals addressed during session: ADL's and self-care      AM-PAC PT "6 Clicks" Mobility   Outcome Measure  Help needed turning from your back to your side while in a flat bed without using bedrails?: None Help needed moving from lying on your back to sitting on the side of a flat bed without using bedrails?: None Help needed moving to and from a bed to a chair (including a wheelchair)?: A Little Help needed standing up from a chair using your arms (e.g., wheelchair or bedside chair)?: A Little Help needed to walk in hospital room?: A Little Help needed climbing 3-5 steps with a railing? : A Little 6 Click Score: 20    End of Session Equipment Utilized During Treatment: Gait belt Activity Tolerance: Patient tolerated treatment well Patient left: Other (comment);in bed(OT present to finish session) Nurse Communication: Mobility status PT Visit Diagnosis: Unsteadiness on feet (R26.81);Other abnormalities of gait and mobility (R26.89);Difficulty in walking, not elsewhere classified (R26.2);Muscle weakness (generalized) (M62.81);Other symptoms and signs involving the nervous system (R29.898);Dizziness and giddiness (R42);Pain Hemiplegia - Right/Left: Right Hemiplegia - dominant/non-dominant: Dominant Hemiplegia - caused by: Cerebral infarction Pain - Right/Left: Right Pain - part  of body: (face)     Time: 2025-4270 PT Time Calculation (min) (ACUTE ONLY): 16 min  Charges:  $Therapeutic Exercise: 8-22 mins                     Kenshin Splawn, PT, GCS 03/22/19,1:22 PM

## 2019-03-22 NOTE — Progress Notes (Signed)
OT treatment: Patient agreeable to session.  Continues to be limited by dizziness, R UE ataxia, weakness, and pain R side face/lip.  Completing ADLs and transfers with min guard assist, increased time and cueing to utilize R UE functionally. Reports vision remains blurry on R side, but improved ability to scan and track during testing.  Continued education with gaze stabilization techniques, limited carryover during session.  Flat affect and difficult to determine level of dizziness, limited verbalizations and engagement from patient. Educated on R UE proximal exercises, fatigues easily.  Updated dc plan to OP OT.  Will follow acutely.    03/22/19 1300  OT Visit Information  Last OT Received On 03/22/19  Assistance Needed +1  PT/OT/SLP Co-Evaluation/Treatment Yes  Reason for Co-Treatment For patient/therapist safety;To address functional/ADL transfers  OT goals addressed during session ADL's and self-care  History of Present Illness 39 yo male with onset of dizziness and LOB was admitted, had R facial numbness, mult falls, ataxia, dizziness.  Dx with acute infarct along R cerebellar peduncle.  Has acute kidney disease, intracranial atherosclerosis and HLD.  PMHx:  atheroslcerosis, anxiety, CHF, DM, obesity, stroke, EF 45-50%, sleep apnea, HTN, CKD3,   Precautions  Precautions Fall  Precaution Comments dizziness   Pain Assessment  Pain Assessment Faces  Faces Pain Scale 6  Pain Location right side of his face, lip  Pain Descriptors / Indicators Discomfort;Grimacing  Pain Intervention(s) Monitored during session;Limited activity within patient's tolerance  Cognition  Arousal/Alertness Awake/alert  Behavior During Therapy Flat affect  Overall Cognitive Status Within Functional Limits for tasks assessed  General Comments patient is very flat, limited interactions and provides 1 word answers; cognition otherwise appears [facility]   ADL  Overall ADL's  Needs assistance/impaired  Grooming Wash/dry  hands;Oral care;Min guard;Standing  Lower Body Dressing Min guard;Sit to/from stand  Lower Body Dressing Details (indicate cue type and reason) able to don socks with setup (figure 4 technique), min guard sit to stand   [employer] guard;Ambulation  Toilet Transfer Details (indicate cue type and reason) simulated in room   Functional mobility during ADLs Min guard;Minimal assistance;Cueing for safety  General ADL Comments Patient limited by impaired balance, R UE ataxia, dizziness   Bed Mobility  Overal bed mobility Needs Assistance  Bed Mobility Supine to Sit  Supine to sit Supervision  General bed mobility comments increased time, supervision for safety   Balance  Overall balance assessment Needs assistance  Sitting-balance support No upper extremity supported;Feet supported  Sitting balance-Leahy Scale Fair  Sitting balance - Comments supervision   Standing balance support No upper extremity supported;During functional activity  Standing balance-Leahy Scale Fair  Standing balance comment min guard to min assist dynamicallly  Restrictions  Weight Bearing Restrictions No  Vision- Assessment  Vision Assessment? Yes  Additional Comments patient able to scan and track in all planes with B eyes today, reports blurry vision on the R only; tracking to R and superior causes pain; reports tracking assessment does not cause increased dizziness today   Transfers  Overall transfer level Needs assistance  Equipment used None  Transfers Sit to/from Stand  Sit to Stand Min guard  General transfer comment min guard to steady   General Comments  General comments (skin integrity, edema, etc.) mild dizziness throughout session > with standing compared to sitting, educated on gaze stabilization but poor carryover throughout session; requires constant questioning on dizziness status   Exercises  Exercises General Upper Extremity  General Exercises - Upper Extremity  Shoulder  Flexion  AROM;Right;10 reps;Seated  Shoulder Horizontal ABduction AROM;Left;10 reps;Seated  Shoulder Horizontal ADduction AROM;Left;10 reps;Seated  OT - End of Session  Activity Tolerance Patient tolerated treatment well  Patient left in bed;with bed alarm set  Nurse Communication Mobility status  OT Assessment/Plan  OT Plan Discharge plan remains appropriate;Frequency remains appropriate  OT Visit Diagnosis Unsteadiness on feet (R26.81);Ataxia, unspecified (R27.0);Low vision, both eyes (H54.2)  OT Frequency (ACUTE ONLY) Min 3X/week  Follow Up Recommendations Outpatient OT;Supervision/Assistance - 24 hour (HHOT if family cannot provide transportation )  OT Equipment None recommended by OT  AM-PAC OT "6 Clicks" Daily Activity Outcome Measure (Version 2)  Help from another person eating meals? 4  Help from another person taking care of personal grooming? 3  Help from another person toileting, which includes using toliet, bedpan, or urinal? 3  Help from another person bathing (including washing, rinsing, drying)? 3  Help from another person to put on and taking off regular upper body clothing? 3  Help from another person to put on and taking off regular lower body clothing? 3  6 Click Score 19  OT Goal Progression  Progress towards OT goals Progressing toward goals  Acute Rehab OT Goals  Patient Stated Goal to be able to ride motorcycle   OT Goal Formulation With patient  OT Time Calculation  OT Start Time (ACUTE ONLY) 1104  OT Stop Time (ACUTE ONLY) 1127  OT Time Calculation (min) 23 min  OT General Charges  $OT Visit 1 Visit  OT Treatments  $Self Care/Home Management  8-22 mins    Barry Brunner, OT Acute Rehabilitation Services Pager [phone] Office [phone]

## 2019-03-23 DIAGNOSIS — K130 Diseases of lips: Secondary | ICD-10-CM

## 2019-03-23 LAB — GLUCOSE, CAPILLARY
Glucose-Capillary: 80 mg/dL (ref 70–99)
Glucose-Capillary: 99 mg/dL (ref 70–99)

## 2019-03-23 LAB — BASIC METABOLIC PANEL
Anion gap: 11 (ref 5–15)
BUN: 12 mg/dL (ref 6–20)
CO2: 23 mmol/L (ref 22–32)
Calcium: 8.7 mg/dL — ABNORMAL LOW (ref 8.9–10.3)
Chloride: 107 mmol/L (ref 98–111)
Creatinine, Ser: 2.16 mg/dL — ABNORMAL HIGH (ref 0.61–1.24)
GFR calc Af Amer: 44 mL/min — ABNORMAL LOW (ref >60)
GFR calc non Af Amer: 38 mL/min — ABNORMAL LOW (ref >60)
Glucose, Bld: 85 mg/dL (ref 70–99)
Potassium: 3.6 mmol/L (ref 3.5–5.1)
Sodium: 141 mmol/L (ref 135–145)

## 2019-03-23 LAB — CBC
HCT: 41.4 % (ref 39.0–52.0)
Hemoglobin: 13.6 g/dL (ref 13.0–17.0)
MCH: 27.5 pg (ref 26.0–34.0)
MCHC: 32.9 g/dL (ref 30.0–36.0)
MCV: 83.8 fL (ref 80.0–100.0)
Platelets: 202 10*3/uL (ref 150–400)
RBC: 4.94 MIL/uL (ref 4.22–5.81)
RDW: 15.9 % — ABNORMAL HIGH (ref 11.5–15.5)
WBC: 5.2 10*3/uL (ref 4.0–10.5)
nRBC: 0 % (ref 0.0–0.2)

## 2019-03-23 MED ORDER — LIDOCAINE 4 % EX CREA: TOPICAL_CREAM | Freq: Two times a day (BID) | CUTANEOUS | 0 refills | Status: DC | PRN

## 2019-03-23 MED ORDER — ASPIRIN 325 MG PO TABS: 325.0000 mg | ORAL_TABLET | Freq: Every day | ORAL | 0 refills | Status: DC

## 2019-03-23 MED ORDER — HYDROCODONE-ACETAMINOPHEN 5-325 MG PO TABS: 1.0000 | ORAL_TABLET | Freq: Three times a day (TID) | ORAL | 0 refills | Status: AC | PRN

## 2019-03-23 MED ORDER — CARVEDILOL 3.125 MG PO TABS: 3.1250 mg | ORAL_TABLET | Freq: Two times a day (BID) | ORAL | 0 refills | Status: DC

## 2019-03-23 MED ORDER — BENZOCAINE 10 % MT GEL: Freq: Four times a day (QID) | OROMUCOSAL | 0 refills | Status: DC | PRN

## 2019-03-23 MED ORDER — AMOXICILLIN-POT CLAVULANATE 875-125 MG PO TABS: 1.0000 | ORAL_TABLET | Freq: Two times a day (BID) | ORAL | 0 refills | Status: AC

## 2019-03-23 MED ORDER — CLOPIDOGREL BISULFATE 75 MG PO TABS: 75.0000 mg | ORAL_TABLET | Freq: Every day | ORAL | 3 refills | Status: DC

## 2019-03-23 MED ORDER — POVIDONE-IODINE 10 % EX SOLN: 1.0000 "application " | CUTANEOUS | 0 refills | Status: DC | PRN

## 2019-03-23 NOTE — Discharge Summary (Signed)
Physician Discharge Summary  Joel [name]:[id number] DOB: [birth year] DOA: 03/19/2019  PCP: [name], DO  Admit date: 03/19/2019 Discharge date: 03/23/2019 Consultations: Neurology, ENT Admitted From: home Disposition: home  Discharge Diagnoses:  Principal Problem:   Acute CVA (cerebrovascular accident) [facility]) Active Problems:   Systolic CHF, chronic (HCC)   Chronic kidney disease (CKD), stage III (moderate)   Essential hypertension   Uncontrolled type 2 diabetes mellitus with nephropathy [name])  Hospital Course Summary: 39 y.o.malewithh/o NICM,last EF 30%,grade 2 diastolic dysfunction, DM,CKDstage III, HTN presented with c/oright facial numbness and difficulty getting on the right side for almost 3 weeks with vomiting.Patient was treated for vestibular problems and referred to the ENT whoin turnreferred patient for strokeeval. Patientalso reportsataxiaandsome visual blurriness. ED Course: Afebrile,mild ataxia, strength intact. MRI brain-acute infarctalongright cerebellar peduncle.There is also a probable small chronic infarct near the right frontal horn.  Neurologist on-call was consultedwho felt patient out of window for tPA. Patient passed swallow.Startedon aspirin 325 mg and added Plavix.EKG-normal sinus rhythm with IVCD. Labs-worsening renal function with creatinine of 2.6 (baseline~2).Covidscreennegative. Hospital course: Patient admitted [facility]. Neurology followed along  1. Acute right middle cerebellar CVA-Appreciate neurology assistance.Ischemia is favored over demyelination.MRA concerning for occlusion of the right AICA as well as severe stenosis versus artifact in the left PCA distal P2/proximal P3 segment. Suggestion of intracranial atherosclerosis involving the anterior and posterior circulation, including mild irregularity of the: distal right vertebral artery, basilar artery, supraclinoid right ICA, and  bilateral MCA branches. Transcranial Dopplers with bubble study negative for PFO and Echo showed preserved EF. Neurology recommended to continue DAPT with aspirin 325 mg + Plavix 75 mg x 3 months then Plavix lone therapy indefinitely.LDL 211- patient on Lipitor 80 mg.Hemoglobin A1c 7.0.Passed swallowing evaluation and currently tolerating diet well. PT/OT followed and recommended inpatient rehab. CIR consulted patient patient preferred to go home. He progressed well with PT and cleared for d/c home with outpatient PT referral/assistive device for ambulation.  2. Essential hypertension/severe LVH on echo- cardiac/ blood pressure medications held for permissive HTN on admission. Patient previously had systolic cardiomyopathy with low EF and on several cardiac remodeling agents (coreg, Entresto, amlodipine, lasix etc). Normotensive currently with improved EF on Echo (was improved in 2019 as well)  advised to resume coreg at low dose and f/u cardiology as outpatient. Can utilize lasix prn for leg swellings/sob/weight gain as explained to patient and wife at bedside. Reiterated importance of diet compliance.  3. Hyperlipidemia:LDL 211.Currently on Lipitor 80 mg daily.  4. Type 2 diabetes mellitus, uncontrolled with peripheral vascular disease-Hemoglobin A1c 7.0. Was taking Victoza, Jardiance prior to admission. Home med list also shows Levemir and premeal insulin but wife states he no longer takes those.  BG been running low during HC on Levemir sq 45 units bid-titrated down and then discontinued on discharge.Resume Victoza on discharge and watch BG levels.  5. Recently diagnosed OSA-Patient remains compliant with his CPAP.  6. Morbid obesity-Body mass index is 42.29 kg/m.Outpatient dietary consultation/follow up will be beneficial.Counselled regarding diabetic/cardiac diet compliance  7. Acute on chronic kidney disease stage IIIb.Patient was given IV fluids on presentation. Entresto/aldactone  held. Renal function now improved. Eating well and euvolemic. Resume lasix prn and hold other meds.  8.Issues with the lower lip piercing.Patient has lip piercing. He was able to remove it for the MRI on 03/20/2019.He did place the piercing again after the MRI.On Tuesday, 03/03/2019 he wanted to remove the piercing. The lower lip piercing which  screws in it is stuck. He attempted multiple times to remove it and was not able to remove it and now has some swelling around the site. Patient's friend who performs piercing came to help and they were not able to remove it as well.  Currently using benzocaine internally and lidocaine externally with Neosporin to prevent infection.No fluctuant swelling but reports mild pustular drainage earlier today-still not able to remove it, he may need incision and extraction.  Discussed with ENT Dr [name] who recommended Betadine and  Short term antibiotics--he will arrange to see patient in his clinic in 2 days. Wife will call their office and make appointment. Hydrocodone prn script for acute pain issued.     Discharge Exam:  Vitals:   03/23/19 1000 03/23/19 1158  BP:  (!) 146/112  Pulse:  87  Resp: 18 18  Temp:  98.5 F (36.9 C)  SpO2:  100%   Vitals:   03/23/19 0416 03/23/19 0833 03/23/19 1000 03/23/19 1158  BP: 130/88 (!) 126/91  (!) 146/112  Pulse: 84 90  87  Resp: Temp: 98.2 F (36.8 C) 98.8 F (37.1 C)  98.5 F (36.9 C)  TempSrc: Oral Oral  Oral  SpO2: 97% 97%  100%  Weight:      Height:        General: Pt is alert, awake, not in acute distress Cardiovascular: RRR, S1/S2 +, no rubs, no gallops Respiratory: CTA bilaterally, no wheezing, no rhonchi Abdominal: Soft, NT, ND, bowel sounds + Extremities: no edema, no cyanosis  Discharge Condition:Stable CODE STATUS: Diet recommendation: Recommendations for Outpatient Follow-up:  1. Follow up with PCP: 1 week 2. Follow up with consultants: Primary cardiologist on 2/17 as  scheduled for medication review  3. [facility] in 2 weeks, ENT today 4. Please obtain follow up labs including: BMP  Home Health services upon discharge: none Equipment/Devices upon discharge: walker/cane   Discharge Instructions:  Discharge Instructions    (HEART FAILURE PATIENTS) Call MD:  Anytime you have any of the following symptoms: 1) 3 pound weight gain in 24 hours or 5 pounds in 1 week 2) shortness of breath, with or without a dry hacking cough 3) swelling in the hands, feet or stomach 4) if you have to sleep on extra pillows at night in order to breathe.   Complete by: As directed    Ambulatory referral to Neurology   Complete by: As directed    Follow up with stroke clinic NP (Jessica Vanschaick or Darrol Angel, if both not available, consider Manson Allan, or Ahern) at [facility] in about 4 weeks. Thanks.   Ambulatory referral to Occupational Therapy   Complete by: As directed    Ambulatory referral to Physical Therapy   Complete by: As directed    Call MD for:  persistant dizziness or light-headedness   Complete by: As directed    Call MD for:  redness, tenderness, or signs of infection (pain, swelling, redness, odor or green/yellow discharge around incision site)   Complete by: As directed    Call MD for:  severe uncontrolled pain   Complete by: As directed    Call MD for:  temperature >100.4   Complete by: As directed    Diet - low sodium heart healthy   Complete by: As directed    Diet Carb Modified   Complete by: As directed    Increase activity slowly   Complete by: As directed    Increase activity slowly  Complete by: As directed      Allergies as of 03/23/2019      Reactions   Bidil [isosorb Dinitrate-hydralazine] Other (See Comments)   Severe headache   Nitroglycerin Other (See Comments)   Causes severe headaches      Medication List    STOP taking these medications   amLODipine 5 MG tablet Commonly known as: NORVASC   aspirin 81 MG chewable  tablet Replaced by: aspirin 325 MG tablet   diazepam 5 MG tablet Commonly known as: Valium   empagliflozin 25 MG Tabs tablet Commonly known as: JARDIANCE   HumaLOG 100 UNIT/ML injection Generic drug: insulin lispro   insulin aspart 100 UNIT/ML injection Commonly known as: novoLOG   insulin detemir 100 UNIT/ML injection Commonly known as: Levemir   meclizine 25 MG tablet Commonly known as: ANTIVERT   ofloxacin 0.3 % OTIC solution Commonly known as: FLOXIN   predniSONE 20 MG tablet Commonly known as: DELTASONE   sacubitril-valsartan 97-103 MG Commonly known as: ENTRESTO     TAKE these medications   amoxicillin-clavulanate 875-125 MG tablet Commonly known as: Augmentin Take 1 tablet by mouth 2 (two) times daily for 5 days.   aspirin 325 MG tablet Take 1 tablet (325 mg total) by mouth daily. Start taking on: March 24, 2019 Replaces: aspirin 81 MG chewable tablet   atorvastatin 80 MG tablet Commonly known as: LIPITOR Take 1 tablet (80 mg total) by mouth daily at 6 PM.   benzocaine 10 % mucosal gel Commonly known as: ORAJEL Use as directed in the mouth or throat 4 (four) times daily as needed for mouth pain.   carvedilol 3.125 MG tablet Commonly known as: COREG Take 1 tablet (3.125 mg total) by mouth 2 (two) times daily with a meal. What changed:   medication strength  how much to take   clopidogrel 75 MG tablet Commonly known as: PLAVIX Take 1 tablet (75 mg total) by mouth daily. Start taking on: March 24, 2019   esomeprazole 20 MG capsule Commonly known as: NEXIUM Take 20 mg by mouth daily at 12 noon.   fluticasone 50 MCG/ACT nasal spray Commonly known as: FLONASE Place 2 sprays into both nostrils daily. Use for 4-6 weeks then stop and use seasonally or as needed.   furosemide 20 MG tablet Commonly known as: LASIX Take 1 tablet (20 mg total) by mouth daily. For 5-10 days then as needed   HYDROcodone-acetaminophen 5-325 MG tablet Commonly  known as: NORCO/VICODIN Take 1 tablet by mouth every 8 (eight) hours as needed for up to 5 days for moderate pain.   lidocaine 4 % cream Commonly known as: LMX Apply topically 2 (two) times daily as needed (USE EXTERNALLY for pain at the piercing site).   ondansetron 4 MG disintegrating tablet Commonly known as: Zofran ODT Take 1-2 tablets (4-8 mg total) by mouth every 8 (eight) hours as needed for nausea or vomiting.   povidone-iodine 10 % external solution Commonly known as: BETADINE Apply 1 application topically as needed for wound care.   spironolactone 25 MG tablet Commonly known as: ALDACTONE Take 25 mg by mouth daily.   Victoza 18 MG/3ML Sopn Generic drug: liraglutide Inject 1.2 mg into the skin daily.            Durable Medical Equipment  (From admission, onward)         Start     Ordered   03/23/19 1133  DME Walker  Once    Question Answer Comment  Walker: Without Wheels   Patient needs a walker to treat with the following condition CVA (cerebral vascular accident) (HCC)      03/23/19 1200   03/23/19 1133  DME Cane  Once     03/23/19 1200   03/23/19 1133  For home use only DME Cane  Once     03/23/19 1132         Follow-up Information    [facility]. Schedule an appointment as soon as possible for a visit in 4 week(s).   Specialty: Neurology Contact information: [address] [place] [address] [phone]       [facility] Follow up.   Specialty: Rehabilitation Why: The outpatient therapy will contact you for the first appt Contact information: [id number] [place] [address] [phone]         Allergies  Allergen Reactions  . Bidil [Isosorb Dinitrate-Hydralazine] Other (See Comments)    Severe headache  . Nitroglycerin Other (See Comments)    Causes severe headaches      The results of significant  diagnostics from this hospitalization (including imaging, microbiology, ancillary and laboratory) are listed below for reference.    Labs: BNP (last 3 results) No results for input(s): BNP in the last 8760 hours. Basic Metabolic Panel: Recent Labs  Lab 03/19/19 1521 03/20/19 0344 03/21/19 0524 03/22/19 0151 03/23/19 0219  NA 137 139 138 139 141  K 4.0 3.6 3.1* 3.4* 3.6  CL 101 105 104 104 107  CO2 23 23 24 24 23   GLUCOSE 214* 94 96 120* 85  BUN 22* 23* 18 14 12   CREATININE 2.63* 2.30* 2.04* 2.12* 2.16*  CALCIUM 9.1 8.7* 8.3* 8.7* 8.7*  MG  --   --  2.1  --   --    Liver Function Tests: Recent Labs  Lab 03/19/19 1521 03/20/19 0344  AST 19 17  ALT 22 17  ALKPHOS 72 60  BILITOT 1.1 1.1  PROT 6.7 6.1*  ALBUMIN 3.3* 2.9*   No results for input(s): LIPASE, AMYLASE in the last 168 hours. No results for input(s): AMMONIA in the last 168 hours. CBC: Recent Labs  Lab 03/19/19 1521 03/20/19 0344 03/21/19 0524 03/22/19 0151 03/23/19 0219  WBC 6.3 6.8 6.0 5.2 5.2  NEUTROABS 4.1 3.3  --   --   --   HGB 15.1 13.9 13.6 13.3 13.6  HCT 48.0 42.4 41.5 40.2 41.4  MCV 85.6 83.3 83.2 82.4 83.8  PLT 227 190 190 211 202   Cardiac Enzymes: No results for input(s): CKTOTAL, CKMB, CKMBINDEX, TROPONINI in the last 168 hours. BNP: Invalid input(s): POCBNP CBG: Recent Labs  Lab 03/22/19 0632 03/22/19 1146 03/22/19 1610 03/22/19 2102 03/23/19 0617  GLUCAP 99 103* 142* 96 80   D-Dimer No results for input(s): DDIMER in the last 72 hours. Hgb A1c No results for input(s): HGBA1C in the last 72 hours. Lipid Profile No results for input(s): CHOL, HDL, LDLCALC, TRIG, CHOLHDL, LDLDIRECT in the last 72 hours. Thyroid function studies No results for input(s): TSH, T4TOTAL, T3FREE, THYROIDAB in the last 72 hours.  Invalid input(s): FREET3 Anemia work up No results for input(s): VITAMINB12, FOLATE, FERRITIN, TIBC, IRON, RETICCTPCT in the last 72 hours. Urinalysis    Component  Value Date/Time   COLORURINE YELLOW 03/19/2019 1940   APPEARANCEUR HAZY (A) 03/19/2019 1940   LABSPEC 1.033 (H) 03/19/2019 1940   PHURINE 5.0 03/19/2019 1940   GLUCOSEU >=500 (  A) 03/19/2019 1940   HGBUR NEGATIVE 03/19/2019 [place] NEGATIVE 03/19/2019 1940   KETONESUR 5 (A) 03/19/2019 1940   PROTEINUR NEGATIVE 03/19/2019 1940   NITRITE NEGATIVE 03/19/2019 1940   LEUKOCYTESUR NEGATIVE 03/19/2019 1940   Sepsis Labs Invalid input(s): PROCALCITONIN,  WBC,  LACTICIDVEN Microbiology Recent Results (from the past 240 hour(s))  SARS CORONAVIRUS 2 (TAT 6-24 HRS) Nasopharyngeal Nasopharyngeal Swab     Status: None   Collection Time: 03/19/19  6:50 PM   Specimen: Nasopharyngeal Swab  Result Value Ref Range Status   SARS Coronavirus 2 NEGATIVE NEGATIVE Final    Comment: (NOTE) SARS-CoV-2 target nucleic acids are NOT DETECTED. The SARS-CoV-2 RNA is generally detectable in upper and lower respiratory specimens during the acute phase of infection. Negative results do not preclude SARS-CoV-2 infection, do not rule out co-infections with other pathogens, and should not be used as the sole basis for treatment or other patient management decisions. Negative results must be combined with clinical observations, patient history, and epidemiological information. The expected result is Negative. Fact Sheet for Patients: SugarRoll.be Fact Sheet for Healthcare Providers: https://www.woods-mathews.com/ This test is not yet approved or cleared by the [place] FDA and  has been authorized for detection and/or diagnosis of SARS-CoV-2 by FDA under an Emergency Use Authorization (EUA). This EUA will remain  in effect (meaning this test can be used) for the duration of the COVID-19 declaration under Section 56 4(b)(1) of the Act, 21 U.S.C. section 360bbb-3(b)(1), unless the authorization is terminated or revoked sooner. Performed at [facility] [place] [address] [place], [place] [address]     Procedures/Studies: CT Head Wo Contrast  Result Date: 02/23/2019 CLINICAL DATA:  Dizziness and ataxia EXAM: CT HEAD WITHOUT CONTRAST TECHNIQUE: Contiguous axial images were obtained from the base of the skull through the vertex without intravenous contrast. COMPARISON:  March 03, 2017 FINDINGS: Brain: The ventricles are normal in size and configuration. There is no intracranial mass, hemorrhage, extra-axial fluid collection, or midline shift. The brain parenchyma appears unremarkable. There is no demonstrable acute infarct. Vascular: There is no hyperdense vessel. No vascular calcifications are evident. Skull: The bony calvarium appears intact. Sinuses/Orbits: There is mucosal thickening in each maxillary antrum. There is mucosal thickening and opacification in multiple ethmoid air cells as well as patchy opacity in each sphenoid sinus. Orbits appear symmetric bilaterally. Other: Visualized mastoid air cells are clear. IMPRESSION: Areas of paranasal sinus disease.  Study otherwise unremarkable. Electronically Signed   By: Lowella Grip III M.D.   On: 02/23/2019 12:53   MR ANGIO HEAD WO CONTRAST  Result Date: 03/20/2019 CLINICAL DATA:  39 year old male with recent dizziness and hearing loss. Patchy acute right brainstem/cerebellar peduncle infarct on brain MRI yesterday. EXAM: MRA HEAD WITHOUT CONTRAST TECHNIQUE: Angiographic images of the Circle of Willis were obtained using MRA technique without intravenous contrast. COMPARISON:  Brain MRI 03/19/2019 and earlier. Neck MRA today reported separately. FINDINGS: Antegrade flow in the posterior circulation with dominant right vertebral artery. The non dominant left vertebral artery appears to functionally terminates in PICA which is patent. The right PICA origin is patent on series 5, image 21. There is mild tapering of the right vertebrobasilar junction, but no significant distal vertebral or proximal  basilar stenosis. The left AICA origin is patent although no right AICA flow is identified. The basilar artery remains diminutive and mildly irregular and tapers to the basilar tip. Patent SCA origins. Fetal type bilateral PCA origins. Right PCA branches appear  within normal limits. Possible high-grade stenosis of the left PCA distal P2 or proximal P3 segment versus artifact (series 1039, image 16). There is distal left PCA flow signal preserved. Antegrade flow in both ICA siphons. Mild supraclinoid right siphon irregularity and stenosis suspected (series 1023 image 4). No left siphon stenosis. Ophthalmic and posterior communicating artery origins are normal. Patent carotid termini. Patent MCA and ACA origins. Questionable mild irregularity at the right MCA and ACA origins. Left ACA, anterior communicating artery and visible ACA branches are within normal limits. Left MCA M1 segment is mildly tortuous and bifurcates early without stenosis. Similar early right MCA bifurcation without stenosis. Visible bilateral MCA branches appear patent with mild irregularity. IMPRESSION: 1. No large vessel occlusion. Possible occlusion of the right AICA, or it could be diminutive or absent. 2. Severe stenosis versus artifact in the left PCA distal P2/proximal P3 segment. 3. Suggestion of intracranial atherosclerosis involving the anterior and posterior circulation, including mild irregularity of the: distal right vertebral artery, basilar artery, supraclinoid right ICA, and bilateral MCA branches. 4. Non dominant left vertebral artery functionally terminates in PICA. Fetal type bilateral PCA origins. Electronically Signed   By: Odessa Fleming M.D.   On: 03/20/2019 09:26   MR ANGIO NECK WO CONTRAST  Result Date: 03/20/2019 CLINICAL DATA:  39 year old male with recent dizziness and hearing loss. Patchy acute right brainstem/cerebellar peduncle infarct on brain MRI yesterday. EXAM: MRA NECK WITHOUT CONTRAST TECHNIQUE: Angiographic images  of the neck were obtained using MRA technique without intravenous contrast. Carotid stenosis measurements (when applicable) are obtained utilizing NASCET criteria, using the distal internal carotid diameter as the denominator. COMPARISON:  Brain MRI yesterday. FINDINGS: Good quality 2D and 3D time-of-flight images. A 3 vessel arch configuration is evident. There is antegrade flow in the bilateral cervical carotid arteries. Carotid bifurcations and cervical ICAs appear within normal limits. Grossly normal proximal subclavian arteries. Antegrade flow in both cervical vertebral arteries to the skull base, the right is dominant and the left is diminutive. No cervical vertebral artery stenosis is evident. IMPRESSION: Negative neck MRA.  Dominant right vertebral artery. Electronically Signed   By: Odessa Fleming M.D.   On: 03/20/2019 09:20   MR BRAIN/IAC W WO CONTRAST  Result Date: 03/19/2019 CLINICAL DATA:  Recent onset dizziness, confusion, right hearing loss, history of stroke EXAM: MRI HEAD WITHOUT AND WITH CONTRAST TECHNIQUE: Multiplanar, multiecho pulse sequences of the brain and surrounding structures were obtained without and with intravenous contrast. CONTRAST:  75mL MULTIHANCE GADOBENATE DIMEGLUMINE 529 MG/ML IV SOLN COMPARISON:  None. FINDINGS: Brain: There is reduced diffusion involving the right brachium pontis. There is some corresponding enhancement. No evidence of intracranial hemorrhage. There is a small subcentimeter focus of T2 hyperintensity near the right frontal horn, which may reflect a chronic small vessel infarct. There is no cerebellopontine angle mass. Inner ear structures demonstrate an unremarkable MR appearance. There is no abnormal enhancement within the internal auditory canals. Ventricles and sulci are normal in size and configuration. There is no mass effect or edema. There is no extra-axial fluid collection. Vascular: Major vessel flow voids at the skull base are preserved. Skull and upper  cervical spine: Normal marrow signal is preserved. Sinuses/Orbits: Paranasal sinuses are clear. The orbits are unremarkable. Other: The sella is unremarkable.  Mastoid air cells are clear. IMPRESSION: Small acute infarction involving the right middle cerebellar peduncle. Corresponding enhancement may reflect early blood brain barrier disruption. Given presence of vascular risk factors, ischemia is favored over demyelination. There is also  a probable small chronic infarct near the right frontal horn. These results will be called to the ordering clinician or representative by the Radiologist Assistant, and communication documented in the PACS or zVision Dashboard. Electronically Signed   By: Guadlupe Spanish M.D.   On: 03/19/2019 13:13   VAS [place] TRANSCRANIAL DOPPLER W BUBBLES  Result Date: 03/23/2019  Transcranial Doppler with Bubble Indications: Stroke. Comparison Study: No prior study. Performing Technologist: Gertie Fey MHA, RDMS, RVT, RDCS  Examination Guidelines: A complete evaluation includes B-mode imaging, spectral Doppler, color Doppler, and power Doppler as needed of all accessible portions of each vessel. Bilateral testing is considered an integral part of a complete examination. Limited examinations for reoccurring indications may be performed as noted.  Summary:  A vascular evaluation was performed. The left Terminal ICA was studied. An IV was inserted into the patient's left Forearm. Verbal informed consent was obtained.  No evidence of HITS (high intensity transient signals) at rest or with Valsalva. Therefore, there is no evidence of clinically significant PFO (patent foramen ovale). Negative TCD Bubble study *See table(s) above for TCD measurements and observations.  Diagnosing physician: Delia Heady MD Electronically signed by Delia Heady MD on 03/23/2019 at 8:18:17 AM.    Final    ECHOCARDIOGRAM COMPLETE  Result Date: 03/20/2019   ECHOCARDIOGRAM REPORT   Patient Name:   BLUFORD SEDLER Date  of Exam: 03/20/2019 Medical Rec #:  [id number]     Height:       67.0 in Accession #:    [id number]    Weight:       270.0 lb Date of Birth:  11/05/1980      BSA:          2.30 m Patient Age:    38 years      BP:           110/81 mmHg Patient Gender: M             HR:           87 bpm. [facility]am [facility] Procedure: 2D Echo Indications:    Stroke 434.91 / I163.9  History:        Patient has prior history of Echocardiogram examinations, most                 recent 03/02/2017. CHF, Stroke; Risk Factors:Hypertension,                 Diabetes and Non-Smoker. Dilated cardiomyopathy , Chronic Kidney                 Disease.  Sonographer:    Jeryl Columbia Referring Phys: 79 ARSHAD N KAKRAKANDY IMPRESSIONS  1. Left ventricular ejection fraction, by visual estimation, is 55 to 60%. The left ventricle has normal function. There is severely increased left ventricular hypertrophy.  2. Left ventricular diastolic parameters are consistent with Grade I diastolic dysfunction (impaired relaxation).  3. The left ventricle has no regional wall motion abnormalities.  4. Global right ventricle has normal systolic function.The right ventricular size is normal. No increase in right ventricular wall thickness.  5. Left atrial size was normal.  6. Right atrial size was normal.  7. The mitral valve is grossly normal. No evidence of mitral valve regurgitation.  8. The tricuspid valve is grossly normal.  9. The tricuspid valve is grossly normal. Tricuspid valve regurgitation is mild. 10. The aortic valve is tricuspid. Aortic valve regurgitation is not visualized. Mild aortic valve sclerosis without  stenosis. 11. The pulmonic valve was grossly normal. Pulmonic valve regurgitation is not visualized. 12. The inferior vena cava is normal in size with greater than 50% respiratory variability, suggesting right atrial pressure of 3 mmHg. FINDINGS  Left Ventricle: Left ventricular ejection fraction, by visual estimation, is 55 to 60%. The left  ventricle has normal function. The left ventricle has no regional wall motion abnormalities. The left ventricular internal cavity size was the left ventricle is normal in size. There is severely increased left ventricular hypertrophy. Concentric left ventricular hypertrophy. Left ventricular diastolic parameters are consistent with Grade I diastolic dysfunction (impaired relaxation). Normal left atrial pressure. Right Ventricle: The right ventricular size is normal. No increase in right ventricular wall thickness. Global RV systolic function is has normal systolic function. Left Atrium: Left atrial size was normal in size. Right Atrium: Right atrial size was normal in size Pericardium: There is no evidence of pericardial effusion. Mitral Valve: The mitral valve is grossly normal. There is mild thickening of the mitral valve leaflet(s). No evidence of mitral valve regurgitation. Tricuspid Valve: The tricuspid valve is grossly normal. Tricuspid valve regurgitation is mild. Aortic Valve: The aortic valve is tricuspid. . There is mild thickening of the aortic valve. Aortic valve regurgitation is not visualized. Mild aortic valve sclerosis is present, with no evidence of aortic valve stenosis. There is mild thickening of the aortic valve. Pulmonic Valve: The pulmonic valve was grossly normal. Pulmonic valve regurgitation is not visualized. Pulmonic regurgitation is not visualized. Aorta: The aortic root is normal in size and structure. Venous: The inferior vena cava is normal in size with greater than 50% respiratory variability, suggesting right atrial pressure of 3 mmHg. IAS/Shunts: No atrial level shunt detected by color flow Doppler.  LEFT VENTRICLE PLAX 2D LVIDd:         3.90 cm  Diastology LVIDs:         2.30 cm  LV e' lateral:   5.44 cm/s LV PW:         1.60 cm  LV E/e' lateral: 7.9 LV IVS:        1.40 cm  LV e' medial:    6.64 cm/s LVOT diam:     2.00 cm  LV E/e' medial:  6.5 LV SV:         48 ml LV SV Index:    19.37 LVOT Area:     3.14 cm  RIGHT VENTRICLE RV S prime:     16.00 cm/s TAPSE (M-mode): 1.6 cm LEFT ATRIUM             Index       RIGHT ATRIUM           Index LA diam:        3.90 cm 1.70 cm/m  RA Area:     13.40 cm LA Vol (A2C):   25.4 ml 11.06 ml/m RA Volume:   28.80 ml  12.54 ml/m LA Vol (A4C):   55.5 ml 24.16 ml/m LA Biplane Vol: 37.9 ml 16.50 ml/m   AORTA Ao Root diam: 2.50 cm MITRAL VALVE MV Area (PHT): 4.21 cm             SHUNTS MV PHT:        52.20 msec           Systemic Diam: 2.00 cm MV Decel Time: 180 msec MV E velocity: 43.00 cm/s 103 cm/s MV A velocity: 66.40 cm/s 70.3 cm/s MV E/A ratio:  0.65  1.5  Prentice Docker MD Electronically signed by Prentice Docker MD Signature Date/Time: 03/20/2019/12:51:02 PM    Final     Time coordinating discharge: Over 30 minutes  SIGNED:   Alessandra Bevels, MD  [facility] 03/23/2019, 12:00 PM Pager : [phone]

## 2019-03-23 NOTE — Progress Notes (Addendum)
Physical Therapy Treatment Patient Details Name: Joel [name] MRN: [id number] DOB: Oct 20, 1980 Today's Date: 03/23/2019    History of Present Illness 39 yo male with onset of dizziness and LOB was admitted, had R facial numbness, mult falls, ataxia, dizziness.  Dx with acute infarct along R cerebellar peduncle.  Has acute kidney disease, intracranial atherosclerosis and HLD.  PMHx:  atheroslcerosis, anxiety, CHF, DM, obesity, stroke, EF 45-50%, sleep apnea, HTN, CKD3,     PT Comments    Pt performed gt and stair training with cues for safety.  He remains weak on the R side with R sided blurry vision.  He does report dizziness but it does not get worse with movement.  Pt educated on safety and continued use of RLE at home.  Educated on modified single limb stance on R to challenge balance and sequencing for safe use of stairs.  He remains appropriate for outpatient.      Follow Up Recommendations  Outpatient PT     Equipment Recommendations  Cane(but patient likley will not use.)    Recommendations for Other Services       Precautions / Restrictions Precautions Precautions: Fall Precaution Comments: Continues to report dizziness but it does not worsen with mobility remains the same Restrictions Weight Bearing Restrictions: No    Mobility  Bed Mobility               General bed mobility comments: Pt standing in room with no socks on, on arrival.  Pt educated to donn socks for safety.  Transfers Overall transfer level: Needs assistance Equipment used: None Transfers: Sit to/from Stand Sit to Stand: Supervision         General transfer comment: Supervision for safety.  Ambulation/Gait Ambulation/Gait assistance: Supervision Gait Distance (Feet): 150 Feet Assistive device: None Gait Pattern/deviations: Step-through pattern;Decreased stance time - right;Antalgic;Staggering right Gait velocity: reduced   General Gait Details: No LOB but noticeable weakness in R side  of LE.  He has very short stance time and decreased reciprocal armswing on R.   Stairs Stairs: Yes Stairs assistance: Min guard Stair Management: Two rails;Backwards;Forwards Number of Stairs: 10 General stair comments: Cues for sequencing and hand placement, pt performed both forwards and backwards.   Wheelchair Mobility    Modified Rankin (Stroke Patients Only) Modified Rankin (Stroke Patients Only) Pre-Morbid Rankin Score: No symptoms Modified Rankin: Moderately severe disability     Balance Overall balance assessment: Needs assistance Sitting-balance support: Feet supported Sitting balance-Leahy Scale: Good Sitting balance - Comments: supervision      Standing balance-Leahy Scale: Fair Standing balance comment: requires close supervision/min guard                            Cognition Arousal/Alertness: Awake/alert Behavior During Therapy: Flat affect Overall Cognitive Status: Within Functional Limits for tasks assessed                                 General Comments: Pt very flat and almost annoyed with intervention vs frustration with situation.  He is very motivated to return to baseline and voices wanting to be independent.      Exercises      General Comments        Pertinent Vitals/Pain Pain Assessment: 0-10 Pain Score: 9  Pain Location: Lip Pain Descriptors / Indicators: Grimacing;Tender;Sore Pain Intervention(s): Monitored during session;Repositioned    Home Living  Prior Function            PT Goals (current goals can now be found in the care plan section) Acute Rehab PT Goals Patient Stated Goal: to be able to ride motorcycle  PT Goal Formulation: With patient Potential to Achieve Goals: Fair Progress towards PT goals: Progressing toward goals    Frequency    Min 3X/week      PT Plan Current plan remains appropriate    Co-evaluation              AM-PAC PT "6  Clicks" Mobility   Outcome Measure  Help needed turning from your back to your side while in a flat bed without using bedrails?: None Help needed moving from lying on your back to sitting on the side of a flat bed without using bedrails?: None Help needed moving to and from a bed to a chair (including a wheelchair)?: None Help needed standing up from a chair using your arms (e.g., wheelchair or bedside chair)?: None Help needed to walk in hospital room?: A Little Help needed climbing 3-5 steps with a railing? : A Little 6 Click Score: 22    End of Session Equipment Utilized During Treatment: Gait belt Activity Tolerance: Patient tolerated treatment well Patient left: (standing at sink with wife present) Nurse Communication: Mobility status PT Visit Diagnosis: Unsteadiness on feet (R26.81);Other abnormalities of gait and mobility (R26.89);Difficulty in walking, not elsewhere classified (R26.2);Muscle weakness (generalized) (M62.81);Other symptoms and signs involving the nervous system (R29.898);Dizziness and giddiness (R42);Pain Hemiplegia - Right/Left: Right Hemiplegia - dominant/non-dominant: Dominant Hemiplegia - caused by: Cerebral infarction Pain - Right/Left: Right     Time: 1101-1120 PT Time Calculation (min) (ACUTE ONLY): 19 min  Charges:  $Gait Training: 8-22 mins                     Joel [name] , PTA Acute Rehabilitation Services Pager [phone] Office [phone]     Joel [name] 03/23/2019, 11:37 AM

## 2019-03-23 NOTE — Progress Notes (Signed)
Occupational Therapy Treatment Patient Details Name: Joel [name] MRN: [id number] DOB: October 23, 1980 Today's Date: 03/23/2019    History of present illness 39 yo male with onset of dizziness and LOB was admitted, had R facial numbness, mult falls, ataxia, dizziness.  Dx with acute infarct along R cerebellar peduncle.  Has acute kidney disease, intracranial atherosclerosis and HLD.  PMHx:  atheroslcerosis, anxiety, CHF, DM, obesity, stroke, EF 45-50%, sleep apnea, HTN, CKD3,    OT comments  Patient seen prior to dc to provide HEP for R UE strength and coordination, provided exercises as listed below with level 1 theraband.  Educated patient and spouse on use 1-2x/day, with cueing for slow controlled movements.  Patient reports mild dizziness after completion of each exercise, but fades quickly.  Pt and spouse educated on importance of seated ADLs due to dizziness and close support when standing or mobilizing.  Continue to recommend OP OT.    Follow Up Recommendations  Outpatient OT;Supervision/Assistance - 24 hour    Equipment Recommendations  None recommended by OT    Recommendations for Other Services      Precautions / Restrictions Precautions Precautions: Fall Precaution Comments: Continues to report dizziness but it does not worsen with mobility remains the same Restrictions Weight Bearing Restrictions: No       Mobility Bed Mobility Overal bed mobility: Modified Independent             General bed mobility comments: Pt standing in room with no socks on, on arrival.  Pt educated to donn socks for safety.  Transfers Overall transfer level: Needs assistance Equipment used: None Transfers: Sit to/from Stand Sit to Stand: Supervision         General transfer comment: Supervision for safety.    Balance Overall balance assessment: Needs assistance Sitting-balance support: Feet supported Sitting balance-Leahy Scale: Good Sitting balance - Comments: supervision       Standing balance-Leahy Scale: Fair Standing balance comment: requires close supervision/min guard                           ADL either performed or assessed with clinical judgement   ADL Overall ADL's : Needs assistance/impaired                                       General ADL Comments: session focused on HEP with theraband to R UE     Vision       Perception     Praxis      Cognition Arousal/Alertness: Awake/alert Behavior During Therapy: Flat affect Overall Cognitive Status: Within Functional Limits for tasks assessed                                 General Comments: Pt very flat and almost annoyed with intervention vs frustration with situation.  He is very motivated to return to baseline and voices wanting to be independent.        Exercises Exercises: General Upper Extremity General Exercises - Upper Extremity Shoulder Flexion: Strengthening;Right;10 reps;Theraband Theraband Level (Shoulder Flexion): Level 1 (Yellow) Shoulder ABduction: Strengthening;Right;10 reps;Seated;Theraband Theraband Level (Shoulder Abduction): Level 1 (Yellow) Shoulder Horizontal ABduction: Strengthening;Right;10 reps;Seated;Theraband Theraband Level (Shoulder Horizontal Abduction): Level 1 (Yellow) Shoulder Horizontal ADduction: Strengthening;Right;10 reps;Seated;Theraband Theraband Level (Shoulder Horizontal Adduction): Level 1 (Yellow) Elbow Flexion: Strengthening;10 reps;Right;Seated;Theraband Theraband Level (  Elbow Flexion): Level 1 (Yellow) Elbow Extension: Strengthening;Right;10 reps;Seated;Theraband Theraband Level (Elbow Extension): Level 1 (Yellow)   Shoulder Instructions       General Comments pt reports mild dizziness after each UE exercise, educated on rest breaks and completing exercises seated; spouse present and educated pt/spouse on safety with ADL participation due to dizziness     Pertinent Vitals/ Pain       Pain  Assessment: Faces Pain Score: 9  Faces Pain Scale: Hurts even more Pain Location: Lip Pain Descriptors / Indicators: Grimacing;Tender;Sore Pain Intervention(s): Monitored during session  Home Living                                          Prior Functioning/Environment              Frequency  Min 3X/week        Progress Toward Goals  OT Goals(current goals can now be found in the care plan section)  Progress towards OT goals: Progressing toward goals  Acute Rehab OT Goals Patient Stated Goal: to be able to ride motorcycle  OT Goal Formulation: With patient  Plan Discharge plan remains appropriate;Frequency remains appropriate    Co-evaluation                 AM-PAC OT "6 Clicks" Daily Activity     Outcome Measure   Help from another person eating meals?: None Help from another person taking care of personal grooming?: A Little Help from another person toileting, which includes using toliet, bedpan, or urinal?: A Little Help from another person bathing (including washing, rinsing, drying)?: A Little Help from another person to put on and taking off regular upper body clothing?: A Little Help from another person to put on and taking off regular lower body clothing?: A Little 6 Click Score: 19    End of Session    OT Visit Diagnosis: Unsteadiness on feet (R26.81);Ataxia, unspecified (R27.0);Low vision, both eyes (H54.2)   Activity Tolerance Patient tolerated treatment well   Patient Left in bed;with family/visitor present;with nursing/sitter in room   Nurse Communication Mobility status        Time: 5400-8676 OT Time Calculation (min): 13 min  Charges: OT General Charges $OT Visit: 1 Visit OT Treatments $Neuromuscular Re-education: 8-22 mins  Barry Brunner, OT Acute Rehabilitation Services Pager [phone] Office [phone]    Chancy Milroy 03/23/2019, 1:40 PM

## 2019-03-23 NOTE — Progress Notes (Signed)
Patient being discharged home with home health.  Patient to be transported by his wife.  Patient following up with an ENT today regarding lip pain.  IV removed with the catheter intact.  Discharge instructions and prescription information given to the patient and his wife who verbalized understanding.

## 2019-03-23 NOTE — Progress Notes (Signed)
Dr. Lajuana Ripple notified of patient complaining of lip piercing stuck in lip and asking for it's removal.

## 2019-03-23 NOTE — TOC Transition Note (Signed)
Transition of Care [facility]) - CM/SW Discharge Note   Patient Details  Name: KENLEE MALER MRN: [id number] Date of Birth: Oct 14, 1980  Transition of Care [facility]) CM/SW Contact:  Kermit Balo, RN Phone Number: 03/23/2019, 12:21 PM   Clinical Narrative:    Pt discharging home today with outpatient therapy. Therapy arranged through [facility] outpatient therapy per patient request. Orders in Epic and information on the AVS.  Pt decided to have a cane for home. Cane to be delivered to the room per [employer].  Wife can provide supervision at home and transportation to home.    Final next level of care: OP Rehab Barriers to Discharge: No Barriers Identified   Patient Goals and CMS Choice     Choice offered to / list presented to : Patient  Discharge Placement                       Discharge Plan and Services   Discharge Planning Services: CM Consult            DME Arranged: Gilmer Mor DME Agency: [employer] Date DME Agency Contacted: 03/23/19   Representative spoke with at DME Agency: Zack            Social Determinants of Health (SDOH) Interventions     Readmission Risk Interventions No flowsheet data found.

## 2019-03-23 NOTE — Plan of Care (Signed)
  Problem: Safety: Goal: Ability to remain free from injury will improve Outcome: Progressing   

## 2019-03-23 NOTE — Progress Notes (Signed)
Inpatient Rehab Admissions Coordinator:   Note updated therapy recs to outpatient.  Will sign off at this time.   Estill Dooms, PT, DPT Admissions Coordinator [phone] 03/23/19  9:34 AM

## 2019-03-23 NOTE — Progress Notes (Signed)
Patient discharged out via volunteer services via wheelchair.  Wife accompanying patient home.

## 2019-03-24 ENCOUNTER — Encounter: Payer: Self-pay | Admitting: *Deleted

## 2019-03-24 ENCOUNTER — Telehealth: Payer: Self-pay | Admitting: *Deleted

## 2019-03-24 ENCOUNTER — Telehealth: Payer: Self-pay

## 2019-03-24 DIAGNOSIS — I5022 Chronic systolic (congestive) heart failure: Secondary | ICD-10-CM

## 2019-03-24 NOTE — Telephone Encounter (Signed)
  Patient was recently discharged from the hospital on 03/23/2019  No TCM completed, patient does not qualify for TCM services due to insurance coverage/ medicaid.  Message sent to PCP to see if they would like to see patient for regular hospital follow up after discharge, if patient needs appointment message to be sent to scheduling staff.

## 2019-03-24 NOTE — Telephone Encounter (Signed)
I would still offer regular hospital follow-up, in person or virtual is fine as well if they are interested.  Joel Pilar, DO [facility] 03/24/2019, 9:46 AM

## 2019-03-24 NOTE — Telephone Encounter (Signed)
Shanda Bumps called and left [place] a message that Wagner had a stroke and was in hospital from 2/5-2/9.  He has a follow up with cardiology next week.  Returned her call and let her know that we will need clearance for him to return.  She is also going to check with insurance as they want him to do PT as well.  Sent in basket note to Dr. Mariah Milling for clearance to return to rehab.

## 2019-03-24 NOTE — Telephone Encounter (Signed)
Appt scheduled for Monday 03/29/2019.

## 2019-03-29 ENCOUNTER — Encounter: Payer: Self-pay | Admitting: Family Medicine

## 2019-03-29 ENCOUNTER — Encounter: Payer: Self-pay | Admitting: *Deleted

## 2019-03-29 ENCOUNTER — Ambulatory Visit
Admission: RE | Admit: 2019-03-29 | Discharge: 2019-03-29 | Disposition: A | Discharge status: Home / Self Care | Payer: Medicaid Other | Source: Ambulatory Visit | Attending: Family Medicine | Admitting: Family Medicine

## 2019-03-29 ENCOUNTER — Ambulatory Visit (INDEPENDENT_AMBULATORY_CARE_PROVIDER_SITE_OTHER): Payer: Medicaid Other | Admitting: Family Medicine

## 2019-03-29 ENCOUNTER — Other Ambulatory Visit: Payer: Self-pay

## 2019-03-29 VITALS — BP 116/85 | HR 93 | Resp 16 | Ht 68.0 in | Wt 281.0 lb

## 2019-03-29 DIAGNOSIS — H905 Unspecified sensorineural hearing loss: Secondary | ICD-10-CM | POA: Diagnosis not present

## 2019-03-29 DIAGNOSIS — H9311 Tinnitus, right ear: Secondary | ICD-10-CM

## 2019-03-29 DIAGNOSIS — I6930 Unspecified sequelae of cerebral infarction: Secondary | ICD-10-CM

## 2019-03-29 DIAGNOSIS — I5022 Chronic systolic (congestive) heart failure: Secondary | ICD-10-CM

## 2019-03-29 DIAGNOSIS — G8191 Hemiplegia, unspecified affecting right dominant side: Secondary | ICD-10-CM

## 2019-03-29 DIAGNOSIS — K5900 Constipation, unspecified: Secondary | ICD-10-CM

## 2019-03-29 DIAGNOSIS — Z8673 Personal history of transient ischemic attack (TIA), and cerebral infarction without residual deficits: Secondary | ICD-10-CM

## 2019-03-29 IMAGING — CR DG ABDOMEN 1V
1 series · 3 of 3 positions shown · non-contrast
Comparison: None.

CLINICAL DATA: Constipation, nausea

EXAM:
ABDOMEN - 1 VIEW

[Series 1: dg abd 1 view · 0.14mm/px · 3 of 3 slices shown]
[im 1/3]
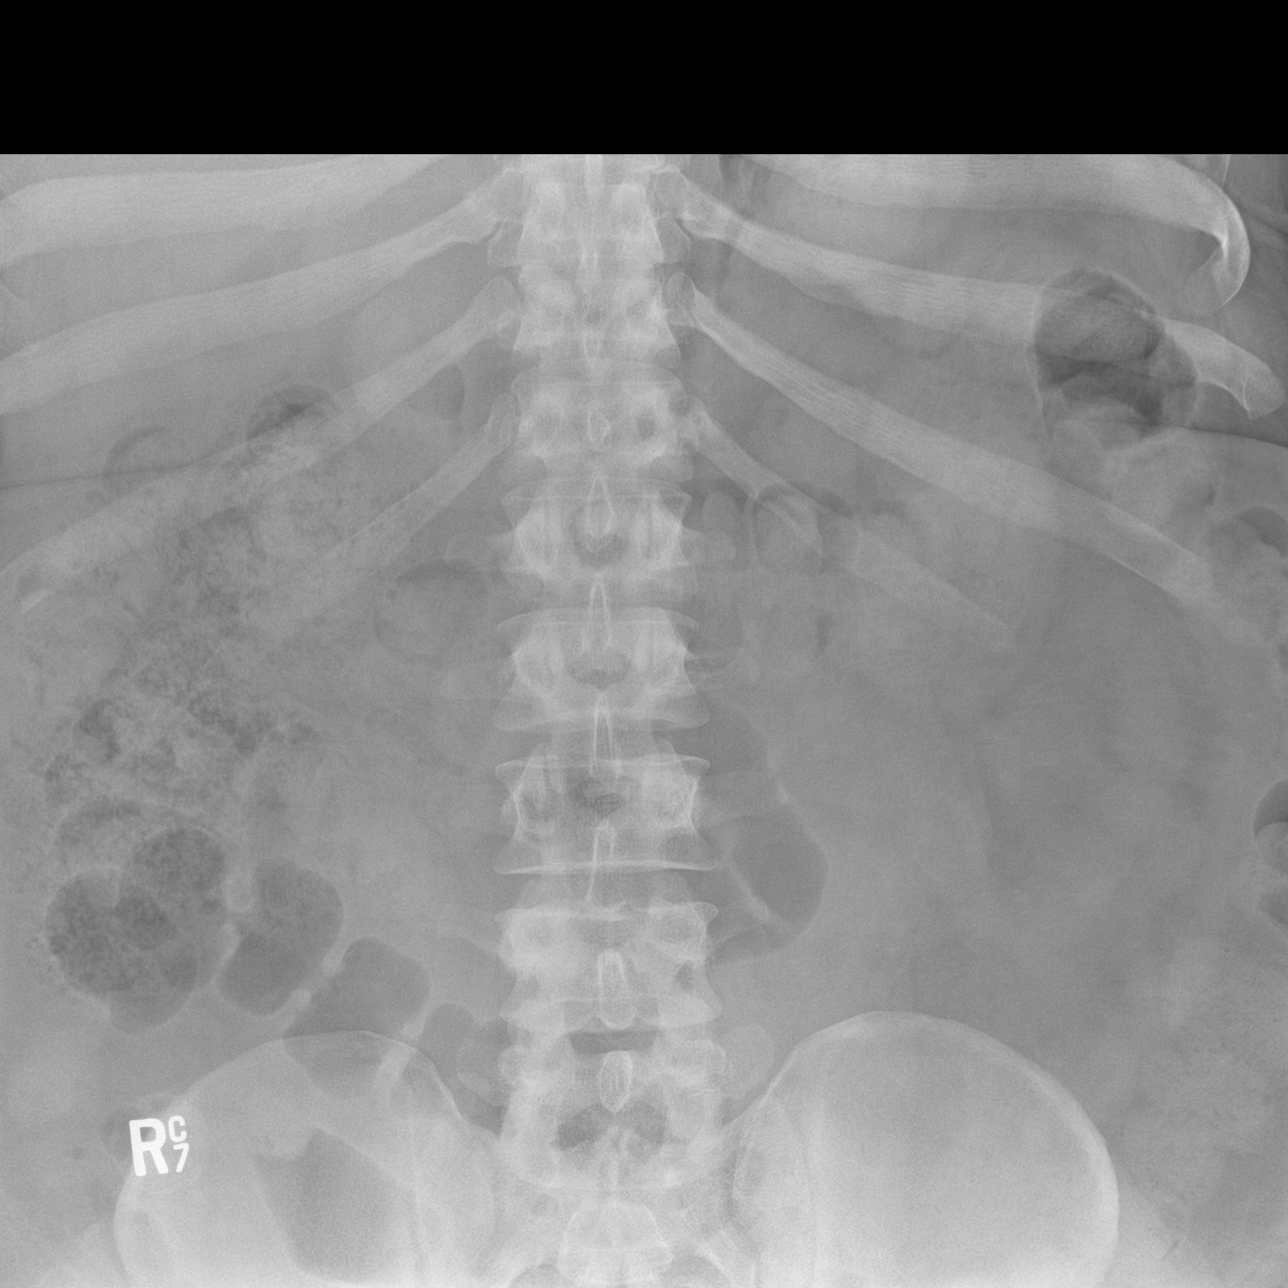
[im 2/3]
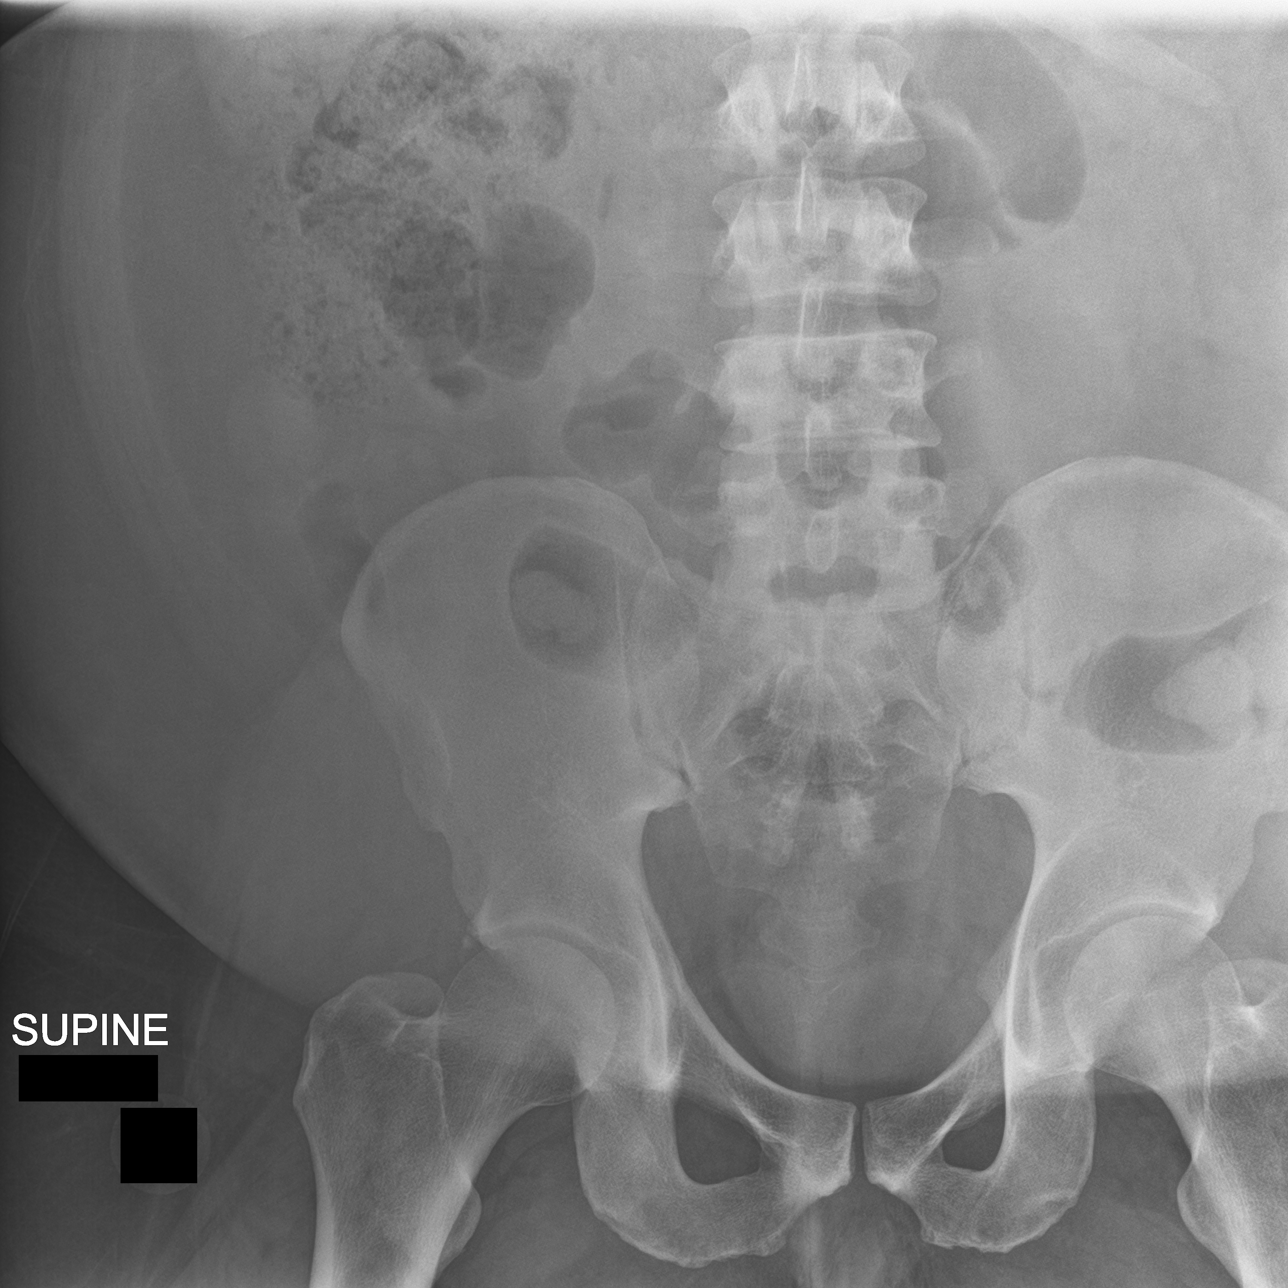
[im 3/3]
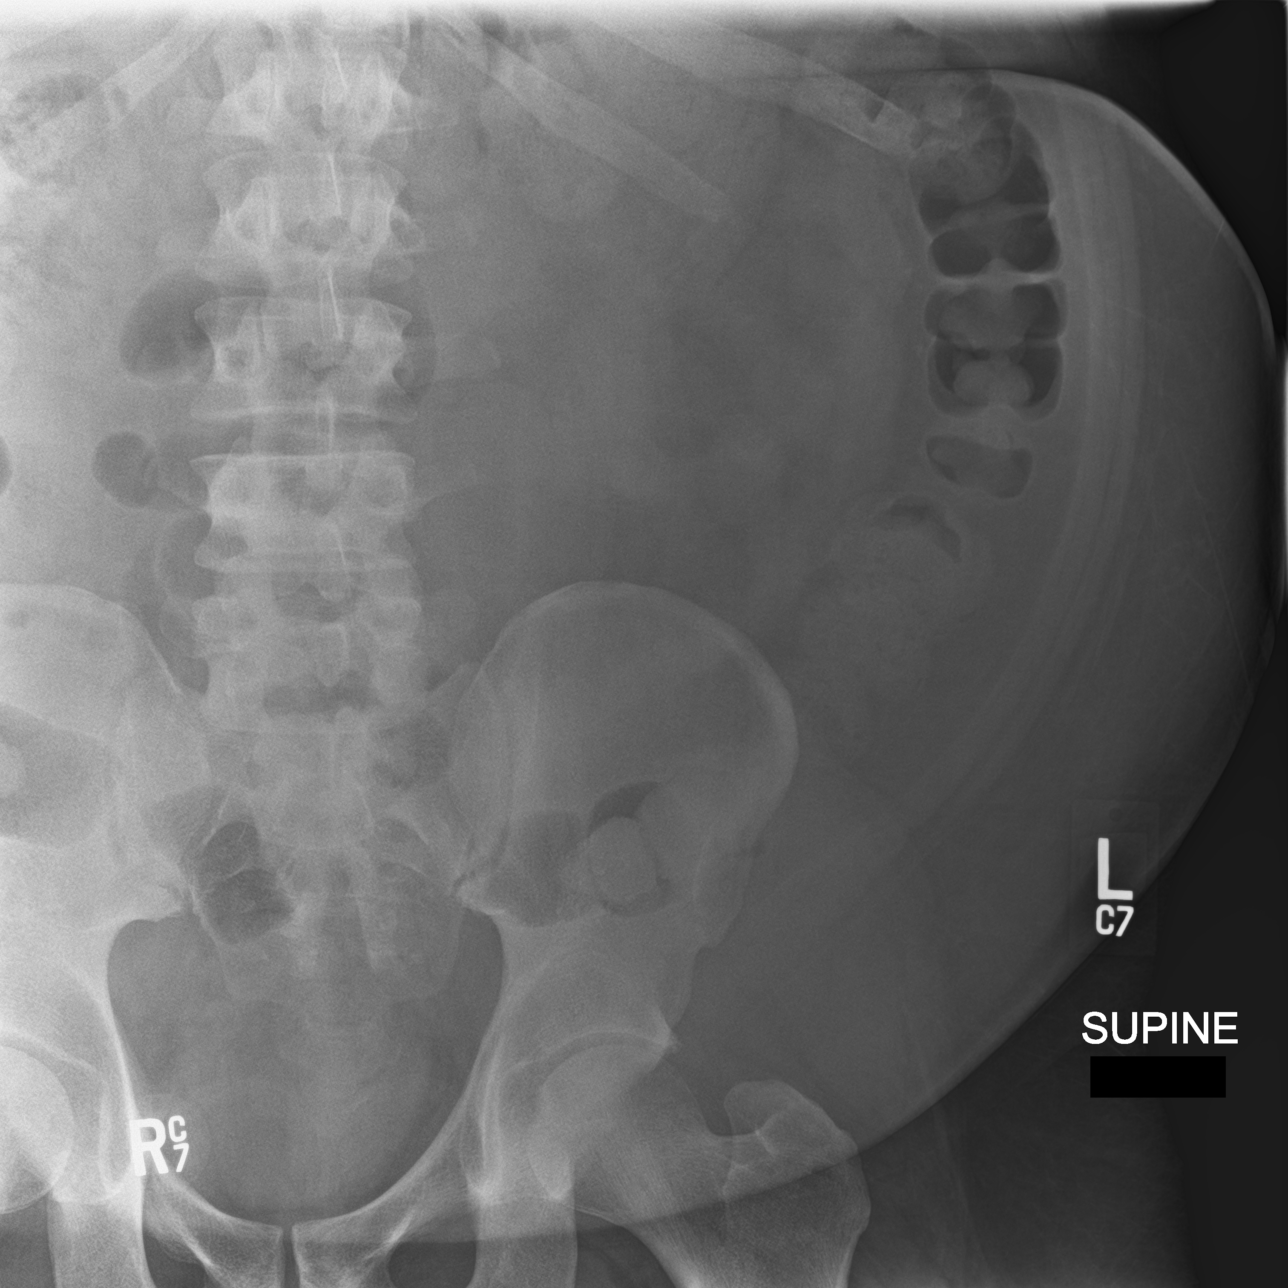

[3 of 3 positions shown; findings below may reference images not displayed]

FINDINGS: Nonobstructive bowel gas pattern.

Mild to moderate colonic stool burden.
IMPRESSION: Mild to moderate colonic stool burden, suggesting mild constipation.

## 2019-03-29 NOTE — Progress Notes (Signed)
Discharge Progress Report  Patient Details  Name: Joel [name] MRN: [id number] Date of Birth: 1980/03/20 Referring Provider:     Cardiac Rehab from 11/30/2018 in [facility]  Referring Provider  Gollan       Number of Visits: 28  Reason for Discharge:  Early Exit:  Personal  Smoking History:  Social History   Tobacco Use  Smoking Status Never Smoker  Smokeless Tobacco Never Used    Diagnosis:  Heart failure, chronic systolic ([place])  ADL UCSD:   Initial Exercise Prescription: Initial Exercise Prescription - 11/30/18 1400      Date of Initial Exercise RX and Referring Provider   Date  11/30/18    Referring Provider  Gollan      Treadmill   MPH  2.1    Grade  1.5    Minutes  15    METs  3      Recumbant Bike   Level  3    RPM  60    Watts  53    Minutes  15    METs  3      NuStep   Level  4    SPM  80    Minutes  15    METs  3      Recumbant Elliptical   Level  2    RPM  50    Minutes  15    METs  3      REL-XR   Level  3    Speed  50    Minutes  15    METs  3      T5 Nustep   Level  2    SPM  80    Minutes  15    METs  3      Prescription Details   Frequency (times per week)  3      Intensity   THRR 40-80% of Max Heartrate  131-166    Ratings of Perceived Exertion  11-13    Perceived Dyspnea  0-4      Resistance Training   Training Prescription  Yes    Weight  3 lb    Reps  10-15       Discharge Exercise Prescription (Final Exercise Prescription Changes): Exercise Prescription Changes - 03/17/19 1527      Home Exercise Plan   Plans to continue exercise at  [facility] (comment)   walking, [employer]   Frequency  Add 2 additional days to program exercise sessions.    Initial Home Exercises Provided  12/14/18       Functional Capacity: 6 Minute Walk    Row Name 11/30/18 1431         6 Minute Walk   Phase  Initial     Distance  1120 feet     Walk Time  6 minutes     # of Rest Breaks   0     MPH  2.12     METS  3.1     RPE  9     Perceived Dyspnea   1     VO2 Peak  10.8     Symptoms  No     Resting HR  97 bpm     Resting BP  124/84     Resting Oxygen Saturation   97 %     Exercise Oxygen Saturation  during 6 min walk  99 %     Max  Ex. HR  114 bpm     Max Ex. BP  126/88     2 Minute Post BP  122/84        Psychological, QOL, Others - Outcomes: PHQ 2/9: Depression screen [facility] 2/9 03/03/2019 02/22/2019 11/30/2018 03/22/2018 12/19/2017  Decreased Interest 1 1 2 3  0  Down, Depressed, Hopeless 2 2 3 2  0  PHQ - 2 Score 3 3 5 5  0  Altered sleeping 1 1 1 1  0  Tired, decreased energy 3 1 3 3  0  Change in appetite 3 1 3 3  0  Feeling bad or failure about yourself  3 2 3 3  0  Trouble concentrating 2 1 3 1  0  Moving slowly or fidgety/restless 1 1 1 2  0  Suicidal thoughts 2 1 3 2  0  PHQ-9 Score 18 11 22 20  0  Difficult doing work/chores Somewhat difficult Somewhat difficult Extremely dIfficult Extremely dIfficult Not difficult at all    Quality of Life: Quality of Life - 11/30/18 1448      Quality of Life   Select  Quality of Life      Quality of Life Scores   Health/Function Pre  6.93 %    Socioeconomic Pre  13.71 %    Psych/Spiritual Pre  5.42 %    Family Pre  16.1 %    GLOBAL Pre  9.48 %       Personal Goals: Goals established at orientation with interventions provided to work toward goal. Personal Goals and Risk Factors at Admission - 11/30/18 1452      Core Components/Risk Factors/Patient Goals on Admission    Weight Management  Yes;Obesity;Weight Loss    Intervention  Weight Management/Obesity: Establish reasonable short term and long term weight goals.    Admit Weight  335 lb 12.8 oz (152.3 kg)    Goal Weight: Short Term  325 lb (147.4 kg)    Goal Weight: Long Term  315 lb (142.9 kg)    Intervention  Provide education about signs/symptoms and action to take for hypo/hyperglycemia.;Provide education about proper nutrition, including hydration, and  aerobic/resistive exercise prescription along with prescribed medications to achieve blood glucose in normal ranges: Fasting glucose 65-99 mg/dL    Expected Outcomes  Short Term: Participant verbalizes understanding of the signs/symptoms and immediate care of hyper/hypoglycemia, proper foot care and importance of medication, aerobic/resistive exercise and nutrition plan for blood glucose control.;Long Term: Attainment of HbA1C < 7%.    Heart Failure  Yes    Intervention  Provide a combined exercise and nutrition program that is supplemented with education, support and counseling about heart failure. Directed toward relieving symptoms such as shortness of breath, decreased exercise tolerance, and extremity edema.    Expected Outcomes  Improve functional capacity of life;Short term: Attendance in program 2-3 days a week with increased exercise capacity. Reported lower sodium intake. Reported increased fruit and vegetable intake. Reports medication compliance.;Short term: Daily weights obtained and reported for increase. Utilizing diuretic protocols set by physician.;Long term: Adoption of self-care skills and reduction of barriers for early signs and symptoms recognition and intervention leading to self-care maintenance.    Intervention  Provide education on lifestyle modifcations including regular physical activity/exercise, weight management, moderate sodium restriction and increased consumption of fresh fruit, vegetables, and low fat dairy, alcohol moderation, and smoking cessation.;Monitor prescription use compliance.    Expected Outcomes  Short Term: Continued assessment and intervention until BP is < 140/34m HG in hypertensive participants. < 130/812mHG in hypertensive  participants with diabetes, heart failure or chronic kidney disease.;Long Term: Maintenance of blood pressure at goal levels.    Intervention  Provide education and support for participant on nutrition & aerobic/resistive exercise along  with prescribed medications to achieve LDL <63m, HDL >437m    Expected Outcomes  Short Term: Participant states understanding of desired cholesterol values and is compliant with medications prescribed. Participant is following exercise prescription and nutrition guidelines.;Long Term: Cholesterol controlled with medications as prescribed, with individualized exercise RX and with personalized nutrition plan. Value goals: LDL < 7077mHDL > 40 mg.        Personal Goals Discharge: Goals and Risk Factor Review    Row Name 12/28/18 1608 01/25/19 1716 02/22/19 1729         Core Components/Risk Factors/Patient Goals Review   Personal Goals Review  Weight Management/Obesity;Heart Failure;Diabetes;Lipids;Hypertension  Weight Management/Obesity;Lipids;Hypertension;Heart Failure  --     Review  Weight was down to 328 lbs today. Joel [name] his weight has been steadily decreasing and he is working out on the days he does not have rehab and [employer]. Joel [name] his blood sugar at home and reports mostly stable numbers. He weighs every day to help manage heart failure. Patient reports taking all meds as perscribed to manage BP and Lipids.  Patient is doing well with diabetes and watching his diet for his lipids. He wants to lose more weight which he has done already. Joel [name] staying positive and is improving in [facility] blood pressure has still be and the higher side. He states he does have a bit of anxiety which could be another cause for his higher blood pressure.  Joel [name] in less than 300 for the first time today.  [facility] [employer] [facility].  His BG has been in 100-110 range and he has been able to come off some of his medications.     Expected Outcomes  Short: Short term weight goal 325. Long: Independently mange diabetes, heart failure, lipids, and hypertension with meds, exercise, and diet. Long term weight  goal 315 lbs.  Short: lose weight to 299lbs. Long: weigh 200lbs.  Short :  continue to exercise and eat healthy Long :  reach goal weight        Exercise Goals and Review: Exercise Goals    Row Name 11/30/18 1438             Exercise Goals   Increase Physical Activity  Yes       Intervention  Provide advice, education, support and counseling about physical activity/exercise needs.;Develop an individualized exercise prescription for aerobic and resistive training based on initial evaluation findings, risk stratification, comorbidities and participant's personal goals.       Expected Outcomes  Short Term: Attend rehab on a regular basis to increase amount of physical activity.;Long Term: Add in home exercise to make exercise part of routine and to increase amount of physical activity.;Long Term: Exercising regularly at least 3-5 days a week.       Increase Strength and Stamina  Yes       Intervention  Provide advice, education, support and counseling about physical activity/exercise needs.;Develop an individualized exercise prescription for aerobic and resistive training based on initial evaluation findings, risk stratification, comorbidities and participant's personal goals.       Expected Outcomes  Short Term: Increase workloads from initial exercise prescription for resistance, speed, and METs.;Short  Term: Perform resistance training exercises routinely during rehab and add in resistance training at home;Long Term: Improve cardiorespiratory fitness, muscular endurance and strength as measured by increased METs and functional capacity (6MWT)       Able to understand and use rate of perceived exertion (RPE) scale  Yes       Intervention  Provide education and explanation on how to use RPE scale       Expected Outcomes  Short Term: Able to use RPE daily in rehab to express subjective intensity level;Long Term:  Able to use RPE to guide intensity level when exercising independently       Able to  understand and use Dyspnea scale  Yes       Intervention  Provide education and explanation on how to use Dyspnea scale       Expected Outcomes  Short Term: Able to use Dyspnea scale daily in rehab to express subjective sense of shortness of breath during exertion;Long Term: Able to use Dyspnea scale to guide intensity level when exercising independently       Knowledge and understanding of Target Heart Rate Range (THRR)  Yes       Intervention  Provide education and explanation of THRR including how the numbers were predicted and where they are located for reference       Expected Outcomes  Short Term: Able to state/look up THRR;Short Term: Able to use daily as guideline for intensity in rehab;Long Term: Able to use THRR to govern intensity when exercising independently       Able to check pulse independently  Yes       Intervention  Provide education and demonstration on how to check pulse in carotid and radial arteries.;Review the importance of being able to check your own pulse for safety during independent exercise       Expected Outcomes  Short Term: Able to explain why pulse checking is important during independent exercise;Long Term: Able to check pulse independently and accurately       Understanding of Exercise Prescription  Yes       Intervention  Provide education, explanation, and written materials on patient's individual exercise prescription       Expected Outcomes  Short Term: Able to explain program exercise prescription;Long Term: Able to explain home exercise prescription to exercise independently          Exercise Goals Re-Evaluation: Exercise Goals Re-Evaluation    Row Name 12/02/18 1703 12/10/18 1441 12/14/18 1709 12/23/18 1235 12/28/18 1607     Exercise Goal Re-Evaluation   Exercise Goals Review  Increase Physical Activity;Able to understand and use rate of perceived exertion (RPE) scale;Knowledge and understanding of Target Heart Rate Range (THRR);Understanding of Exercise  Prescription;Increase Strength and Stamina;Able to check pulse independently  Increase Physical Activity;Increase Strength and Stamina;Able to understand and use rate of perceived exertion (RPE) scale;Knowledge and understanding of Target Heart Rate Range (THRR);Able to check pulse independently;Understanding of Exercise Prescription  Increase Physical Activity;Increase Strength and Stamina;Able to understand and use rate of perceived exertion (RPE) scale;Knowledge and understanding of Target Heart Rate Range (THRR);Able to check pulse independently;Understanding of Exercise Prescription  Increase Physical Activity;Increase Strength and Stamina;Understanding of Exercise Prescription  Increase Physical Activity;Increase Strength and Stamina;Understanding of Exercise Prescription   Comments  Reviewed RPE scale, THR and program prescription with pt today.  Pt voiced understanding and was given a copy of goals to take home.  Joel [name] tolerates exercise well and seems to enjoy sessions.  Reviewed  home exercise with pt today.  Pt plans to go to [employer] for exercise.  Reviewed THR, pulse, RPE, sign and symptoms, NTG use, and when to call 911 or MD.  Also discussed weather considerations and indoor options.  Pt voiced understanding.  Joel [name] has been doing well in rehab.  He is now on level 4 on the T5 NuStep.  He will be trying out the elliptical this week or next.  We will continue to monitor his progress.  --   Expected Outcomes  Short: Use RPE daily to regulate intensity. Long: Follow program prescription in THR.  Short - attend HT consistently Long :  increase MET level  Short - warm up and cool down correctly and check HR while at PF Long - maintain exercise on his own  Short: Try ellipitical and increase TM.  Long: Continue to walk more and go to [employer] on off days.  --   Halfway Name 12/28/18 1629 01/05/19 1459 01/20/19 1548 01/25/19 1704 02/02/19 1514     Exercise Goal Re-Evaluation   Exercise Goals  Review  Increase Physical Activity;Increase Strength and Stamina;Understanding of Exercise Prescription  Increase Physical Activity;Increase Strength and Stamina;Able to understand and use rate of perceived exertion (RPE) scale;Able to understand and use Dyspnea scale;Knowledge and understanding of Target Heart Rate Range (THRR);Able to check pulse independently;Understanding of Exercise Prescription  Increase Physical Activity;Increase Strength and Stamina;Understanding of Exercise Prescription  Increase Physical Activity;Increase Strength and Stamina  Increase Physical Activity;Increase Strength and Stamina;Able to understand and use rate of perceived exertion (RPE) scale;Knowledge and understanding of Target Heart Rate Range (THRR);Able to check pulse independently;Understanding of Exercise Prescription   Comments  Joel [name] is making progress with exercise levels in class and tried XR machine today for the first time. He is exercising at [employer] on days he does not have rehab. Main goal with exercise continues to be weight loss.  Joel [name] continues to improve MET levels on machines and is losing weight slowly.  He has moved up to 4 lb strength training.  Most recent weight was 322 lb  Joel [name] has been doing well in rehab.  He is up to level 6 on the XR.  He is and level 3 on the recumbert elliptical.  We will continue to montior his progress.  Joel [name] is lifting 4 days a week apart from [facility]. He works out for about an hour and a half. Informed him that he needs rest. Patient understands instructions and why it is important to rest. Joel [name] wants to maintain his progress when the program is over. He wnats to lose weight and be able to walk faster.  Joel [name] is still very consistent with exercise.  He does circuit training on machines in the 10 rep range.   Expected Outcomes  Short: Short term weight goal 325 lbs. Long: Establish an independent exercise routine. Long term weight goal 315 lbs.  Short - continue  current program Long 315 lb and maintain exercise  Short: Talk about adding in intervals.  Long: Continue to work on weight loss.  Short: Able to go further with walking. Long: maintain and improve walking distance and run.  Short :  continue to be consistent with exercise Long : maintain  fitness when program is complete   Row Name 02/19/19 1115 03/03/19 1524 03/17/19 1525         Exercise Goal Re-Evaluation   Exercise Goals Review  --  Increase Physical Activity;Increase Strength and Stamina;Able to understand and use  rate of perceived exertion (RPE) scale;Able to understand and use Dyspnea scale;Knowledge and understanding of Target Heart Rate Range (THRR);Able to check pulse independently  Increase Physical Activity;Increase Strength and Stamina;Understanding of Exercise Prescription     Comments  Out since last review.  To return on 02/22/19.  Joel [name] continues to progress well.  He achieved another weight goal of below 300 lb.  Cinch is doing well in rehab.  He has been dealing with balance issues and may have a new diagnosis that affects his balance.  Thus he has not been on the treadmill.  We will continue to monitor his progress.     Expected Outcomes  --  Short : - exercise consistently outside program sessions Long :  increase stamina  Short: Try to use treadmill again.  Long: Continue to improve stamina.        Nutrition & Weight - Outcomes: Pre Biometrics - 11/30/18 1440      Pre Biometrics   Height  5' 8"  (1.727 m)    Weight  (!) 335 lb 12.8 oz (152.3 kg)    BMI (Calculated)  51.07    Single Leg Stand  30 seconds        Nutrition: Nutrition Therapy & Goals - 12/03/18 1403      Nutrition Therapy   Diet  low Na, HH, DM diet    Protein (specify units)  120g    Fiber  30 grams    Whole Grain Foods  3 servings    Saturated Fats  12 max. grams    Fruits and Vegetables  5 servings/day    Sodium  1.5 grams      Personal Nutrition Goals   Nutrition Goal  ST: eat more consistent  meals LT: manage BG, get healthy    Comments  Sometimes skips breakfast, eggs and bacon when do have it. no lunch, sugar free kool aid squirts in water. D: no rice bread and limited potatoes. Meat (chicken, fish, steak) and greens (squash, zucchini, green beans) with gravy sometimes. will normally bake or pan fry with oil. Pt reports having limited appetite after heart event. Hunger cues growling in stomach. Discussed high vs low BG, DM, HH eating and general recommendations like eating consistently to ensure pt gets enough protein and kcal as well as manage BG. Discussed importance of eating and exercise. Discussed set point weight and health outcomes.      Intervention Plan   Intervention  Prescribe, educate and counsel regarding individualized specific dietary modifications aiming towards targeted core components such as weight, hypertension, lipid management, diabetes, heart failure and other comorbidities.;Nutrition handout(s) given to patient.    Expected Outcomes  Short Term Goal: Understand basic principles of dietary content, such as calories, fat, sodium, cholesterol and nutrients.;Short Term Goal: A plan has been developed with personal nutrition goals set during dietitian appointment.;Long Term Goal: Adherence to prescribed nutrition plan.       Nutrition Discharge: Nutrition Assessments - 12/01/18 1410      MEDFICTS Scores   Pre Score  48       Education Questionnaire Score:   Goals reviewed with patient; copy given to patient.

## 2019-03-29 NOTE — Progress Notes (Signed)
Cardiac Individual Treatment Plan  Patient Details  Name: Joel [name] MRN: [id number] Date of Birth: [birth year] Referring Provider:     Cardiac Rehab from 11/30/2018 in [facility] Cardiac and Pulmonary Rehab  Referring Provider  Gollan      Initial Encounter Date:    Cardiac Rehab from 11/30/2018 in [facility] Cardiac and Pulmonary Rehab  Date  11/30/18      Visit Diagnosis: Heart failure, chronic systolic ([place])  Patient's Home Medications on Admission:  Current Outpatient Medications:  .  aspirin 325 MG tablet, Take 1 tablet (325 mg total) by mouth daily., Disp: 90 tablet, Rfl: 0 .  atorvastatin (LIPITOR) 80 MG tablet, Take 1 tablet (80 mg total) by mouth daily at 6 PM., Disp: 90 tablet, Rfl: 3 .  benzocaine (ORAJEL) 10 % mucosal gel, Use as directed in the mouth or throat 4 (four) times daily as needed for mouth pain., Disp: 5.3 g, Rfl: 0 .  carvedilol (COREG) 3.125 MG tablet, Take 1 tablet (3.125 mg total) by mouth 2 (two) times daily with a meal., Disp: 60 tablet, Rfl: 0 .  clopidogrel (PLAVIX) 75 MG tablet, Take 1 tablet (75 mg total) by mouth daily., Disp: 30 tablet, Rfl: 3 .  esomeprazole (NEXIUM) 20 MG capsule, Take 20 mg by mouth daily at 12 noon., Disp: , Rfl:  .  fluticasone (FLONASE) 50 MCG/ACT nasal spray, Place 2 sprays into both nostrils daily. Use for 4-6 weeks then stop and use seasonally or as needed., Disp: 16 g, Rfl: 3 .  furosemide (LASIX) 20 MG tablet, Take 1 tablet (20 mg total) by mouth daily. For 5-10 days then as needed, Disp: 20 tablet, Rfl: 0 .  lidocaine (LMX) 4 % cream, Apply topically 2 (two) times daily as needed (USE EXTERNALLY for pain at the piercing site)., Disp: 30 g, Rfl: 0 .  liraglutide (VICTOZA) 18 MG/3ML SOPN, Inject 1.2 mg into the skin daily. , Disp: , Rfl:  .  ondansetron (ZOFRAN ODT) 4 MG disintegrating tablet, Take 1-2 tablets (4-8 mg total) by mouth every 8 (eight) hours as needed for nausea or vomiting. (Patient not taking: Reported on 03/20/2019),  Disp: 30 tablet, Rfl: 1 .  povidone-iodine (BETADINE) 10 % external solution, Apply 1 application topically as needed for wound care., Disp: 480 mL, Rfl: 0 .  spironolactone (ALDACTONE) 25 MG tablet, Take 25 mg by mouth daily., Disp: , Rfl:   Past Medical History: Past Medical History:  Diagnosis Date  . Anxiety   . Chronic diastolic CHF (congestive heart failure) ([place])   . Diabetes mellitus without complication ([place])   . Hyperlipidemia   . Hypertension   . Hypertensive heart disease   . Noncompliance with medications   . Obesity   . Renal disorder   . Stroke ([place])   . Systolic dysfunction    a. TTE 06/2014: EF 45-50%, normal wall motion, mild MR, mild AI    Tobacco Use: Social History   Tobacco Use  Smoking Status Never Smoker  Smokeless Tobacco Never Used    Labs: Recent Review Flowsheet Data    Labs for ITP Cardiac and Pulmonary Rehab Latest Ref Rng & Units 10/11/2014 02/12/2017 02/13/2017 12/15/2017 03/20/2019   Cholestrol 0 - 200 mg/dL - - 435(H) - 264(H)   LDLCALC 0 - 99 mg/dL - - 352(H) - 211(H)   HDL >40 mg/dL - - 30(L) - 21(L)   Trlycerides <150 mg/dL - - 267(H) - 162(H)   Hemoglobin A1c 4.8 - 5.6 % 7.3(H)  14.7(H) - 13.3(H) 7.0(H)       Exercise Target Goals: Exercise Program Goal: Individual exercise prescription set using results from initial 6 min walk test and THRR while considering  patient's activity barriers and safety.   Exercise Prescription Goal: Initial exercise prescription builds to 30-45 minutes a day of aerobic activity, 2-3 days per week.  Home exercise guidelines will be given to patient during program as part of exercise prescription that the participant will acknowledge.  Activity Barriers & Risk Stratification: Activity Barriers & Cardiac Risk Stratification - 11/13/18 1313      Activity Barriers & Cardiac Risk Stratification   Activity Barriers  None    Cardiac Risk Stratification  High       6 Minute Walk: 6 Minute Walk    Row Name  11/30/18 1431         6 Minute Walk   Phase  Initial     Distance  1120 feet     Walk Time  6 minutes     # of Rest Breaks  0     MPH  2.12     METS  3.1     RPE  9     Perceived Dyspnea   1     VO2 Peak  10.8     Symptoms  No     Resting HR  97 bpm     Resting BP  124/84     Resting Oxygen Saturation   97 %     Exercise Oxygen Saturation  during 6 min walk  99 %     Max Ex. HR  114 bpm     Max Ex. BP  126/88     2 Minute Post BP  122/84        Oxygen Initial Assessment:   Oxygen Re-Evaluation:   Oxygen Discharge (Final Oxygen Re-Evaluation):   Initial Exercise Prescription: Initial Exercise Prescription - 11/30/18 1400      Date of Initial Exercise RX and Referring Provider   Date  11/30/18    Referring Provider  Gollan      Treadmill   MPH  2.1    Grade  1.5    Minutes  15    METs  3      Recumbant Bike   Level  3    RPM  60    Watts  53    Minutes  15    METs  3      NuStep   Level  4    SPM  80    Minutes  15    METs  3      Recumbant Elliptical   Level  2    RPM  50    Minutes  15    METs  3      REL-XR   Level  3    Speed  50    Minutes  15    METs  3      T5 Nustep   Level  2    SPM  80    Minutes  15    METs  3      Prescription Details   Frequency (times per week)  3      Intensity   THRR 40-80% of Max Heartrate  131-166    Ratings of Perceived Exertion  11-13    Perceived Dyspnea  0-4      Resistance Training   Training Prescription  Yes  Weight  3 lb    Reps  10-15       Perform Capillary Blood Glucose checks as needed.  Exercise Prescription Changes: Exercise Prescription Changes    Row Name 11/30/18 1400 12/10/18 1400 12/23/18 1200 01/05/19 1400 01/20/19 1500     Response to Exercise   Blood Pressure (Admit)  124/84  136/86  142/88  148/100  144/96   Blood Pressure (Exercise)  126/88  148/82  128/60  162/90  168/80   Blood Pressure (Exit)  122/84  132/82  122/90  134/92  134/92   Heart Rate (Admit)   97 bpm  82 bpm  89 bpm  60 bpm  100 bpm   Heart Rate (Exercise)  114 bpm  129 bpm  133 bpm  132 bpm  133 bpm   Heart Rate (Exit)  99 bpm  122 bpm  111 bpm  121 bpm  105 bpm   Oxygen Saturation (Admit)  97 %  --  --  --  --   Oxygen Saturation (Exercise)  99 %  --  --  --  --   Rating of Perceived Exertion (Exercise)  9  11  13  17  13    Perceived Dyspnea (Exercise)  1  --  --  --  --   Symptoms  none  none  none  none  none   Duration  --  Progress to 30 minutes of  aerobic without signs/symptoms of physical distress  Continue with 30 min of aerobic exercise without signs/symptoms of physical distress.  Continue with 30 min of aerobic exercise without signs/symptoms of physical distress.  Continue with 30 min of aerobic exercise without signs/symptoms of physical distress.   Intensity  --  THRR unchanged  THRR unchanged  THRR unchanged  THRR unchanged     Progression   Progression  --  Continue to progress workloads to maintain intensity without signs/symptoms of physical distress.  Continue to progress workloads to maintain intensity without signs/symptoms of physical distress.  Continue to progress workloads to maintain intensity without signs/symptoms of physical distress.  Continue to progress workloads to maintain intensity without signs/symptoms of physical distress.   Average METs  --  2.6  3.35  4.27  4.61     Resistance Training   Training Prescription  --  Yes  Yes  Yes  Yes   Weight  --  3 lb  3 lb  4 lb  4 lb   Reps  --  10-15  10-15  10-15  10-15     Interval Training   Interval Training  --  --  No  No  No     Treadmill   MPH  --  2.1  2.1  2.1  2.8   Grade  --  1.5  1.5  1.5  1.5   Minutes  --  15  15  15  15    METs  --  3  3.3  3.3  3.75     Recumbant Bike   Level  --  --  3  --  --   Minutes  --  --  15  --  --   METs  --  --  3  --  --     NuStep   Level  --  2  --  --  4   SPM  --  80  --  --  --   Minutes  --  15  --  --  15   METs  --  2.2  --  --  5.4      Recumbant Elliptical   Level  --  --  --  --  3   Minutes  --  --  --  --  15   METs  --  --  --  --  3.2     REL-XR   Level  --  3  2  6  6    Speed  --  50  --  --  --   Minutes  --  15  15  15  15    METs  --  1.3  4.1  5.5  --     T5 Nustep   Level  --  --  4  --  4   Minutes  --  --  15  --  15   METs  --  --  3.2  --  --     Home Exercise Plan   Plans to continue exercise at  --  --  [employer] (comment) walking, [employer] (comment) walking, [employer] (comment) walking, [employer]  --  --  Add 2 additional days to program exercise sessions.  Add 2 additional days to program exercise sessions.  Add 2 additional days to program exercise sessions.   Initial Home Exercises Provided  --  --  12/14/18  12/14/18  12/14/18   Row Name 02/02/19 1500 03/03/19 1500 03/17/19 1500 03/17/19 1527       Response to Exercise   Blood Pressure (Admit)  122/82  142/92  138/92  --    Blood Pressure (Exercise)  158/82  166/84  154/86  --    Blood Pressure (Exit)  100/72  130/70  128/88  --    Heart Rate (Admit)  72 bpm  92 bpm  102 bpm  --    Heart Rate (Exercise)  139 bpm  133 bpm  117 bpm  --    Heart Rate (Exit)  126 bpm  113 bpm  100 bpm  --    Rating of Perceived Exertion (Exercise)  13  11  13   --    Symptoms  none  none  balance concerns  --    Duration  Continue with 30 min of aerobic exercise without signs/symptoms of physical distress.  Continue with 30 min of aerobic exercise without signs/symptoms of physical distress.  Continue with 30 min of aerobic exercise without signs/symptoms of physical distress.  --    Intensity  THRR unchanged  THRR unchanged  THRR unchanged  --      Progression   Progression  Continue to progress workloads to maintain intensity without signs/symptoms of physical distress.  Continue to progress workloads to maintain intensity without signs/symptoms of physical distress.  Continue to  progress workloads to maintain intensity without signs/symptoms of physical distress.  --    Average METs  4.3  4.2  4.2  --      Resistance Training   Training Prescription  Yes  Yes  Yes  --    Weight  4 lb  4 lb  4 lb  --    Reps  10-15  10-15  10-15  --      Interval Training   Interval Training  No  No  No  --      Treadmill   MPH  2.8  --  --  --  Grade  1.5  --  --  --    Minutes  15  --  --  --    METs  3.75  --  --  --      Recumbant Bike   Level  --  5  --  --    RPM  --  50  --  --    Watts  --  34  --  --    Minutes  --  15  --  --      REL-XR   Level  6  3  4   --    Minutes  15  15  15   --    METs  4.3  4.2  4.2  --      T5 Nustep   Level  4  --  --  --    SPM  80  --  --  --    Minutes  15  --  --  --    METs  3  --  --  --      Home Exercise Plan   Plans to continue exercise at  [employer] (comment) walking, [place] (comment) walking, [employer]    Frequency  Add 2 additional days to program exercise sessions.  --  --  Add 2 additional days to program exercise sessions.    Initial Home Exercises Provided  12/14/18  --  --  12/14/18       Exercise Comments: Exercise Comments    Row Name 12/14/18 1708           Exercise Comments  Reviewed home exercise with pt today.  Pt plans to go to [employer] for exercise.  Reviewed THR, pulse, RPE, sign and symptoms, NTG use, and when to call 911 or MD.  Also discussed weather considerations and indoor options.  Pt voiced understanding.          Exercise Goals and Review: Exercise Goals    Row Name 11/30/18 1438             Exercise Goals   Increase Physical Activity  Yes       Intervention  Provide advice, education, support and counseling about physical activity/exercise needs.;Develop an individualized exercise prescription for aerobic and resistive training based on initial evaluation findings, risk stratification, comorbidities and participant's  personal goals.       Expected Outcomes  Short Term: Attend rehab on a regular basis to increase amount of physical activity.;Long Term: Add in home exercise to make exercise part of routine and to increase amount of physical activity.;Long Term: Exercising regularly at least 3-5 days a week.       Increase Strength and Stamina  Yes       Intervention  Provide advice, education, support and counseling about physical activity/exercise needs.;Develop an individualized exercise prescription for aerobic and resistive training based on initial evaluation findings, risk stratification, comorbidities and participant's personal goals.       Expected Outcomes  Short Term: Increase workloads from initial exercise prescription for resistance, speed, and METs.;Short Term: Perform resistance training exercises routinely during rehab and add in resistance training at home;Long Term: Improve cardiorespiratory fitness, muscular endurance and strength as measured by increased METs and functional capacity (6MWT)       Able to understand and use rate of perceived exertion (RPE) scale  Yes       Intervention  Provide education and explanation  on how to use RPE scale       Expected Outcomes  Short Term: Able to use RPE daily in rehab to express subjective intensity level;Long Term:  Able to use RPE to guide intensity level when exercising independently       Able to understand and use Dyspnea scale  Yes       Intervention  Provide education and explanation on how to use Dyspnea scale       Expected Outcomes  Short Term: Able to use Dyspnea scale daily in rehab to express subjective sense of shortness of breath during exertion;Long Term: Able to use Dyspnea scale to guide intensity level when exercising independently       Knowledge and understanding of Target Heart Rate Range (THRR)  Yes       Intervention  Provide education and explanation of THRR including how the numbers were predicted and where they are located for  reference       Expected Outcomes  Short Term: Able to state/look up THRR;Short Term: Able to use daily as guideline for intensity in rehab;Long Term: Able to use THRR to govern intensity when exercising independently       Able to check pulse independently  Yes       Intervention  Provide education and demonstration on how to check pulse in carotid and radial arteries.;Review the importance of being able to check your own pulse for safety during independent exercise       Expected Outcomes  Short Term: Able to explain why pulse checking is important during independent exercise;Long Term: Able to check pulse independently and accurately       Understanding of Exercise Prescription  Yes       Intervention  Provide education, explanation, and written materials on patient's individual exercise prescription       Expected Outcomes  Short Term: Able to explain program exercise prescription;Long Term: Able to explain home exercise prescription to exercise independently          Exercise Goals Re-Evaluation : Exercise Goals Re-Evaluation    Row Name 12/02/18 1703 12/10/18 1441 12/14/18 1709 12/23/18 1235 12/28/18 1607     Exercise Goal Re-Evaluation   Exercise Goals Review  Increase Physical Activity;Able to understand and use rate of perceived exertion (RPE) scale;Knowledge and understanding of Target Heart Rate Range (THRR);Understanding of Exercise Prescription;Increase Strength and Stamina;Able to check pulse independently  Increase Physical Activity;Increase Strength and Stamina;Able to understand and use rate of perceived exertion (RPE) scale;Knowledge and understanding of Target Heart Rate Range (THRR);Able to check pulse independently;Understanding of Exercise Prescription  Increase Physical Activity;Increase Strength and Stamina;Able to understand and use rate of perceived exertion (RPE) scale;Knowledge and understanding of Target Heart Rate Range (THRR);Able to check pulse  independently;Understanding of Exercise Prescription  Increase Physical Activity;Increase Strength and Stamina;Understanding of Exercise Prescription  Increase Physical Activity;Increase Strength and Stamina;Understanding of Exercise Prescription   Comments  Reviewed RPE scale, THR and program prescription with pt today.  Pt voiced understanding and was given a copy of goals to take home.  Gaetano tolerates exercise well and seems to enjoy sessions.  Reviewed home exercise with pt today.  Pt plans to go to [employer] for exercise.  Reviewed THR, pulse, RPE, sign and symptoms, NTG use, and when to call 911 or MD.  Also discussed weather considerations and indoor options.  Pt voiced understanding.  Latoya has been doing well in rehab.  He is now on level 4 on the T5  NuStep.  He will be trying out the elliptical this week or next.  We will continue to monitor his progress.  --   Expected Outcomes  Short: Use RPE daily to regulate intensity. Long: Follow program prescription in THR.  Short - attend HT consistently Long :  increase MET level  Short - warm up and cool down correctly and check HR while at PF Long - maintain exercise on his own  Short: Try ellipitical and increase TM.  Long: Continue to walk more and go to [employer] on off days.  --   [place] Name 12/28/18 1629 01/05/19 1459 01/20/19 1548 01/25/19 1704 02/02/19 1514     Exercise Goal Re-Evaluation   Exercise Goals Review  Increase Physical Activity;Increase Strength and Stamina;Understanding of Exercise Prescription  Increase Physical Activity;Increase Strength and Stamina;Able to understand and use rate of perceived exertion (RPE) scale;Able to understand and use Dyspnea scale;Knowledge and understanding of Target Heart Rate Range (THRR);Able to check pulse independently;Understanding of Exercise Prescription  Increase Physical Activity;Increase Strength and Stamina;Understanding of Exercise Prescription  Increase Physical Activity;Increase  Strength and Stamina  Increase Physical Activity;Increase Strength and Stamina;Able to understand and use rate of perceived exertion (RPE) scale;Knowledge and understanding of Target Heart Rate Range (THRR);Able to check pulse independently;Understanding of Exercise Prescription   Comments  Mc is making progress with exercise levels in class and tried XR machine today for the first time. He is exercising at [employer] on days he does not have rehab. Main goal with exercise continues to be weight loss.  Burnell continues to improve MET levels on machines and is losing weight slowly.  He has moved up to 4 lb strength training.  Most recent weight was 322 lb  Daemion has been doing well in rehab.  He is up to level 6 on the XR.  He is and level 3 on the recumbert elliptical.  We will continue to montior his progress.  Jeris is lifting 4 days a week apart from [facility]. He works out for about an hour and a half. Informed him that he needs rest. Patient understands instructions and why it is important to rest. Deni wants to maintain his progress when the program is over. He wnats to lose weight and be able to walk faster.  Kwaku is still very consistent with exercise.  He does circuit training on machines in the 10 rep range.   Expected Outcomes  Short: Short term weight goal 325 lbs. Long: Establish an independent exercise routine. Long term weight goal 315 lbs.  Short - continue current program Long 315 lb and maintain exercise  Short: Talk about adding in intervals.  Long: Continue to work on weight loss.  Short: Able to go further with walking. Long: maintain and improve walking distance and run.  Short :  continue to be consistent with exercise Long : maintain  fitness when program is complete   Row Name 02/19/19 1115 03/03/19 1524 03/17/19 1525         Exercise Goal Re-Evaluation   Exercise Goals Review  --  Increase Physical Activity;Increase Strength and Stamina;Able to understand and use rate of  perceived exertion (RPE) scale;Able to understand and use Dyspnea scale;Knowledge and understanding of Target Heart Rate Range (THRR);Able to check pulse independently  Increase Physical Activity;Increase Strength and Stamina;Understanding of Exercise Prescription     Comments  Out since last review.  To return on 02/22/19.  Strother continues to progress well.  He achieved another weight goal of  below 300 lb.  Greysen is doing well in rehab.  He has been dealing with balance issues and may have a new diagnosis that affects his balance.  Thus he has not been on the treadmill.  We will continue to monitor his progress.     Expected Outcomes  --  Short : - exercise consistently outside program sessions Long :  increase stamina  Short: Try to use treadmill again.  Long: Continue to improve stamina.        Discharge Exercise Prescription (Final Exercise Prescription Changes): Exercise Prescription Changes - 03/17/19 1527      Home Exercise Plan   Plans to continue exercise at  [facility] (comment)   walking, [employer]   Frequency  Add 2 additional days to program exercise sessions.    Initial Home Exercises Provided  12/14/18       Nutrition:  Target Goals: Understanding of nutrition guidelines, daily intake of sodium <1533m, cholesterol <2019m calories 30% from fat and 7% or less from saturated fats, daily to have 5 or more servings of fruits and vegetables.  Biometrics: Pre Biometrics - 11/30/18 1440      Pre Biometrics   Height  5' 8"  (1.727 m)    Weight  (!) 335 lb 12.8 oz (152.3 kg)    BMI (Calculated)  51.07    Single Leg Stand  30 seconds        Nutrition Therapy Plan and Nutrition Goals: Nutrition Therapy & Goals - 12/03/18 1403      Nutrition Therapy   Diet  low Na, HH, DM diet    Protein (specify units)  120g    Fiber  30 grams    Whole Grain Foods  3 servings    Saturated Fats  12 max. grams    Fruits and Vegetables  5 servings/day    Sodium  1.5 grams       Personal Nutrition Goals   Nutrition Goal  ST: eat more consistent meals LT: manage BG, get healthy    Comments  Sometimes skips breakfast, eggs and bacon when do have it. no lunch, sugar free kool aid squirts in water. D: no rice bread and limited potatoes. Meat (chicken, fish, steak) and greens (squash, zucchini, green beans) with gravy sometimes. will normally bake or pan fry with oil. Pt reports having limited appetite after heart event. Hunger cues growling in stomach. Discussed high vs low BG, DM, HH eating and general recommendations like eating consistently to ensure pt gets enough protein and kcal as well as manage BG. Discussed importance of eating and exercise. Discussed set point weight and health outcomes.      Intervention Plan   Intervention  Prescribe, educate and counsel regarding individualized specific dietary modifications aiming towards targeted core components such as weight, hypertension, lipid management, diabetes, heart failure and other comorbidities.;Nutrition handout(s) given to patient.    Expected Outcomes  Short Term Goal: Understand basic principles of dietary content, such as calories, fat, sodium, cholesterol and nutrients.;Short Term Goal: A plan has been developed with personal nutrition goals set during dietitian appointment.;Long Term Goal: Adherence to prescribed nutrition plan.       Nutrition Assessments: Nutrition Assessments - 12/01/18 1410      MEDFICTS Scores   Pre Score  48       Nutrition Goals Re-Evaluation: Nutrition Goals Re-Evaluation    Row Name 01/06/19 1640 01/18/19 1628 02/22/19 1712         Goals   Nutrition Goal  ST: add CHO before workout LT: manage BG, get healthy  ST: continue with current changes LT: manage BG, get healthy  ST: continue with current changes LT: manage BG, get healthy     Comment  Tuna after workouts. CHOs earlier in the day. Eats before 7pm. Energy been good, will feel faint when working out discussed adding a CHO  before workout. Pt reports loving vegetables.  Pt reports energy is good. Pt reports doing well with diet and exercise and wants to continue. Discussed making sure getting enough calories and eating CHO before workout to fuel workout - a couple of times pt reports having low energy and dizziness when working out.  Pt reports energy and strength is good. Pt reports no changes to diet or exercise. Encouraged snacks pre/post workout     Expected Outcome  ST: add CHO before workout LT: manage BG, get healthy  ST: continue with current changes LT: manage BG, get healthy  ST: continue with current changes LT: manage BG, get healthy        Nutrition Goals Discharge (Final Nutrition Goals Re-Evaluation): Nutrition Goals Re-Evaluation - 02/22/19 1712      Goals   Nutrition Goal  ST: continue with current changes LT: manage BG, get healthy    Comment  Pt reports energy and strength is good. Pt reports no changes to diet or exercise. Encouraged snacks pre/post workout    Expected Outcome  ST: continue with current changes LT: manage BG, get healthy       Psychosocial: Target Goals: Acknowledge presence or absence of significant depression and/or stress, maximize coping skills, provide positive support system. Participant is able to verbalize types and ability to use techniques and skills needed for reducing stress and depression.   Initial Review & Psychosocial Screening: Initial Psych Review & Screening - 11/13/18 1311      Initial Review   Current issues with  Current Stress Concerns    Source of Stress Concerns  Chronic Illness;Unable to participate in former interests or hobbies;Unable to perform yard/household activities    Comments  Rodger is struggling with the issues related to his HF, like SOB and fatigue. He has a great support system and his wife helps him with managing his medications. He is looking forward to starting HT so he can gain back some control in his life      [place]?  Yes      Barriers   Psychosocial barriers to participate in program  There are no identifiable barriers or psychosocial needs.;The patient should benefit from training in stress management and relaxation.      Screening Interventions   Interventions  Encouraged to exercise;Provide feedback about the scores to participant;To provide support and resources with identified psychosocial needs    Expected Outcomes  Short Term goal: Utilizing psychosocial counselor, staff and physician to assist with identification of specific Stressors or current issues interfering with healing process. Setting desired goal for each stressor or current issue identified.;Long Term Goal: Stressors or current issues are controlled or eliminated.;Long Term goal: The participant improves quality of Life and PHQ9 Scores as seen by post scores and/or verbalization of changes;Short Term goal: Identification and review with participant of any Quality of Life or Depression concerns found by scoring the questionnaire.       Quality of Life Scores:  Quality of Life - 11/30/18 1448      Quality of Life   Select  Quality of Life  Quality of Life Scores   Health/Function Pre  6.93 %    Socioeconomic Pre  13.71 %    Psych/Spiritual Pre  5.42 %    Family Pre  16.1 %    GLOBAL Pre  9.48 %      Scores of 19 and below usually indicate a poorer quality of life in these areas.  A difference of  2-3 points is a clinically meaningful difference.  A difference of 2-3 points in the total score of the Quality of Life Index has been associated with significant improvement in overall quality of life, self-image, physical symptoms, and general health in studies assessing change in quality of life.  PHQ-9: Recent Review Flowsheet Data    Depression screen [facility] 2/9 03/03/2019 02/22/2019 11/30/2018 03/22/2018 12/19/2017   Decreased Interest 1 1 2 3  0   Down, Depressed, Hopeless 2 2 3 2  0   PHQ - 2 Score 3 3 5 5  0    Altered sleeping 1 1 1 1  0   Tired, decreased energy 3 1 3 3  0   Change in appetite 3 1 3 3  0   Feeling bad or failure about yourself  3 2 3 3  0   Trouble concentrating 2 1 3 1  0   Moving slowly or fidgety/restless 1 1 1 2  0   Suicidal thoughts 2 1 3  2  0   PHQ-9 Score 18 11 22 20  0   Difficult doing work/chores Somewhat difficult Somewhat difficult Extremely dIfficult Extremely dIfficult Not difficult at all     Interpretation of Total Score  Total Score Depression Severity:  1-4 = Minimal depression, 5-9 = Mild depression, 10-14 = Moderate depression, 15-19 = Moderately severe depression, 20-27 = Severe depression   Psychosocial Evaluation and Intervention: Psychosocial Evaluation - 11/13/18 1318      Psychosocial Evaluation & Interventions   Comments  Javon is struggling with the issues related to his HF, like SOB and fatigue. He has a great support system and his wife helps him with managing his medications. He is looking forward to starting HT so he can gain back some control in his life    Expected Outcomes  Short: Attend [facility] to gain confidence in exercising and feel more "like himself". Long: develop self care habits.       Psychosocial Re-Evaluation: Psychosocial Re-Evaluation    [place] Name 12/28/18 1618 01/25/19 1710 02/22/19 1734         Psychosocial Re-Evaluation   Current issues with  History of Depression  Current Depression;Current Anxiety/Panic;Current Sleep Concerns  Current Depression;Current Anxiety/Panic;Current Sleep Concerns     Comments  Patient reports he has always struggled some with depression in life but tries not to think too much about troubling things that could drag him down. Currently no reported new depression. Has a strong support system.  Ervin is getting his CPAP on Friday for sleep. He says this machine feels much better and he can tolerate it more. He only had 4 apnea events on his CPAP. He states that he does have anxiety but is on no  medication to help him. He is going to speak to his doctor about his anxiety.  Reviewed patient health questionnaire (PHQ-9) with patient for follow up. Previously, patients score indicated signs/symptoms of depression.  Reviewed to see if patient is improving symptom wise while in program.  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 He wants to live for his daughter and no intentions of hurting himself.     Expected Outcomes  Short: Continue to attend cardiac rehab regularly Long: Manage mood with healthy mental health routine and habits.  Short: speak to doctor about anxiety. Long: take medications as prescribed if prescribed regularly.  Short: Continue to attend [facility] regularly for regular exercise and social engagement. Long: Continue to improve symptoms and manage a positive mental state.     Interventions  --  Encouraged to attend Cardiac Rehabilitation for the exercise  Relaxation education;Stress management education;Encouraged to attend Cardiac Rehabilitation for the exercise     Continue Psychosocial Services   Follow up required by staff  Follow up required by staff  Follow up required by staff        Psychosocial Discharge (Final Psychosocial Re-Evaluation): Psychosocial Re-Evaluation - 02/22/19 1734      Psychosocial Re-Evaluation   Current issues with  Current Depression;Current Anxiety/Panic;Current Sleep Concerns    Comments  Reviewed patient health questionnaire (PHQ-9) with patient for follow up. Previously, patients score indicated signs/symptoms of depression.  Reviewed to see if patient is improving symptom wise while in program.  Score improved and patient states that it is because he states he has maritial problems and felt like he was a "waste of space" due to the circumstances. He wants to live for his daughter and no intentions of hurting himself.    Expected  Outcomes  Short: Continue to attend [facility] regularly for regular exercise and social engagement. Long: Continue to improve symptoms and manage a positive mental state.    Interventions  Relaxation education;Stress management education;Encouraged to attend Cardiac Rehabilitation for the exercise    Continue Psychosocial Services   Follow up required by staff       Vocational Rehabilitation: Provide vocational rehab assistance to qualifying candidates.   Vocational Rehab Evaluation & Intervention: Vocational Rehab - 11/13/18 1312      Initial Vocational Rehab Evaluation & Intervention   Assessment shows need for Vocational Rehabilitation  No       Education: Education Goals: Education classes will be provided on a variety of topics geared toward better understanding of heart health and risk factor modification. Participant will state understanding/return demonstration of topics presented as noted by education test scores.  Learning Barriers/Preferences: Learning Barriers/Preferences - 11/13/18 1311      Learning Barriers/Preferences   Learning Barriers  None    Learning Preferences  None       Education Topics:  AED/CPR: - Group verbal and written instruction with the use of models to demonstrate the basic use of the AED with the basic ABC's of resuscitation.   General Nutrition Guidelines/Fats and Fiber: -Group instruction provided by verbal, written material, models and posters to present the general guidelines for heart healthy nutrition. Gives an explanation and review of dietary fats and fiber.   Cardiac Rehab from 01/14/2019 in [facility] Cardiac and Pulmonary Rehab  Date  12/31/18  Educator  [facility]  Instruction Review Code  1- Verbalizes Understanding      Controlling Sodium/Reading Food Labels: -Group verbal and written material supporting the discussion of sodium use in heart healthy nutrition. Review and explanation with models, verbal and written materials for  utilization of the food label.   Exercise Physiology & General Exercise Guidelines: - Group verbal and written instruction with models to review the exercise physiology of the cardiovascular system and associated critical values. Provides general exercise guidelines with  specific guidelines to those with heart or lung disease.    Aerobic Exercise & Resistance Training: - Gives group verbal and written instruction on the various components of exercise. Focuses on aerobic and resistive training programs and the benefits of this training and how to safely progress through these programs..   Cardiac Rehab from 01/14/2019 in [facility] Cardiac and Pulmonary Rehab  Date  12/03/18  Educator  [facility]  Instruction Review Code  1- Verbalizes Understanding      Flexibility, Balance, Mind/Body Relaxation: Provides group verbal/written instruction on the benefits of flexibility and balance training, including mind/body exercise modes such as yoga, pilates and tai chi.  Demonstration and skill practice provided.   Cardiac Rehab from 01/14/2019 in [facility] Cardiac and Pulmonary Rehab  Date  12/31/18 [Balance 12/3]  Educator  AS  Instruction Review Code  1- Verbalizes Understanding      Stress and Anxiety: - Provides group verbal and written instruction about the health risks of elevated stress and causes of high stress.  Discuss the correlation between heart/lung disease and anxiety and treatment options. Review healthy ways to manage with stress and anxiety.   Depression: - Provides group verbal and written instruction on the correlation between heart/lung disease and depressed mood, treatment options, and the stigmas associated with seeking treatment.   Anatomy & Physiology of the Heart: - Group verbal and written instruction and models provide basic cardiac anatomy and physiology, with the coronary electrical and arterial systems. Review of Valvular disease and Heart Failure   Cardiac Rehab from 01/14/2019 in  [facility] Cardiac and Pulmonary Rehab  Date  01/14/19  Educator  [facility]  Instruction Review Code  1- Verbalizes Understanding      Cardiac Procedures: - Group verbal and written instruction to review commonly prescribed medications for heart disease. Reviews the medication, class of the drug, and side effects. Includes the steps to properly store meds and maintain the prescription regimen. (beta blockers and nitrates)   Cardiac Medications I: - Group verbal and written instruction to review commonly prescribed medications for heart disease. Reviews the medication, class of the drug, and side effects. Includes the steps to properly store meds and maintain the prescription regimen.   Cardiac Medications II: -Group verbal and written instruction to review commonly prescribed medications for heart disease. Reviews the medication, class of the drug, and side effects. (all other drug classes)   Cardiac Rehab from 01/14/2019 in [facility] Cardiac and Pulmonary Rehab  Date  12/03/18  Educator  [facility]  Instruction Review Code  1- Verbalizes Understanding       Go Sex-Intimacy & Heart Disease, Get SMART - Goal Setting: - Group verbal and written instruction through game format to discuss heart disease and the return to sexual intimacy. Provides group verbal and written material to discuss and apply goal setting through the application of the S.M.A.R.T. Method.   Other Matters of the Heart: - Provides group verbal, written materials and models to describe Stable Angina and Peripheral Artery. Includes description of the disease process and treatment options available to the cardiac patient.   Exercise & Equipment Safety: - Individual verbal instruction and demonstration of equipment use and safety with use of the equipment.   Cardiac Rehab from 01/14/2019 in [facility] Cardiac and Pulmonary Rehab  Date  11/30/18  Educator  AS  Instruction Review Code  1- Verbalizes Understanding      Infection Prevention: -  Provides verbal and written material to individual with discussion of infection control including proper hand  washing and proper equipment cleaning during exercise session.   Cardiac Rehab from 01/14/2019 in [facility] Cardiac and Pulmonary Rehab  Date  11/30/18  Educator  AS  Instruction Review Code  1- Verbalizes Understanding      Falls Prevention: - Provides verbal and written material to individual with discussion of falls prevention and safety.   Cardiac Rehab from 01/14/2019 in [facility] Cardiac and Pulmonary Rehab  Date  11/30/18  Educator  AS  Instruction Review Code  1- Verbalizes Understanding      Diabetes: - Individual verbal and written instruction to review signs/symptoms of diabetes, desired ranges of glucose level fasting, after meals and with exercise. Acknowledge that pre and post exercise glucose checks will be done for 3 sessions at entry of program.   Cardiac Rehab from 01/14/2019 in [facility] Cardiac and Pulmonary Rehab  Date  11/13/18  Educator  [facility]  Instruction Review Code  1- Verbalizes Understanding      Know Your Numbers and Risk Factors: -Group verbal and written instruction about important numbers in your health.  Discussion of what are risk factors and how they play a role in the disease process.  Review of Cholesterol, Blood Pressure, Diabetes, and BMI and the role they play in your overall health.   Cardiac Rehab from 01/14/2019 in [facility] Cardiac and Pulmonary Rehab  Date  12/03/18  Educator  [facility]  Instruction Review Code  1- Verbalizes Understanding      Sleep Hygiene: -Provides group verbal and written instruction about how sleep can affect your health.  Define sleep hygiene, discuss sleep cycles and impact of sleep habits. Review good sleep hygiene tips.    Other: -Provides group and verbal instruction on various topics (see comments)   Knowledge Questionnaire Score:   Core Components/Risk Factors/Patient Goals at Admission: Personal Goals and Risk Factors at  Admission - 11/30/18 1452      Core Components/Risk Factors/Patient Goals on Admission    Weight Management  Yes;Obesity;Weight Loss    Intervention  Weight Management/Obesity: Establish reasonable short term and long term weight goals.    Admit Weight  335 lb 12.8 oz (152.3 kg)    Goal Weight: Short Term  325 lb (147.4 kg)    Goal Weight: Long Term  315 lb (142.9 kg)    Intervention  Provide education about signs/symptoms and action to take for hypo/hyperglycemia.;Provide education about proper nutrition, including hydration, and aerobic/resistive exercise prescription along with prescribed medications to achieve blood glucose in normal ranges: Fasting glucose 65-99 mg/dL    Expected Outcomes  Short Term: Participant verbalizes understanding of the signs/symptoms and immediate care of hyper/hypoglycemia, proper foot care and importance of medication, aerobic/resistive exercise and nutrition plan for blood glucose control.;Long Term: Attainment of HbA1C < 7%.    Heart Failure  Yes    Intervention  Provide a combined exercise and nutrition program that is supplemented with education, support and counseling about heart failure. Directed toward relieving symptoms such as shortness of breath, decreased exercise tolerance, and extremity edema.    Expected Outcomes  Improve functional capacity of life;Short term: Attendance in program 2-3 days a week with increased exercise capacity. Reported lower sodium intake. Reported increased fruit and vegetable intake. Reports medication compliance.;Short term: Daily weights obtained and reported for increase. Utilizing diuretic protocols set by physician.;Long term: Adoption of self-care skills and reduction of barriers for early signs and symptoms recognition and intervention leading to self-care maintenance.    Intervention  Provide education on lifestyle modifcations including regular  physical activity/exercise, weight management, moderate sodium restriction and  increased consumption of fresh fruit, vegetables, and low fat dairy, alcohol moderation, and smoking cessation.;Monitor prescription use compliance.    Expected Outcomes  Short Term: Continued assessment and intervention until BP is < 140/35m HG in hypertensive participants. < 130/861mHG in hypertensive participants with diabetes, heart failure or chronic kidney disease.;Long Term: Maintenance of blood pressure at goal levels.    Intervention  Provide education and support for participant on nutrition & aerobic/resistive exercise along with prescribed medications to achieve LDL <7021mHDL >84m1m  Expected Outcomes  Short Term: Participant states understanding of desired cholesterol values and is compliant with medications prescribed. Participant is following exercise prescription and nutrition guidelines.;Long Term: Cholesterol controlled with medications as prescribed, with individualized exercise RX and with personalized nutrition plan. Value goals: LDL < 70mg67mL > 40 mg.       Core Components/Risk Factors/Patient Goals Review:  Goals and Risk Factor Review    Row Name 12/28/18 1608 01/25/19 1716 02/22/19 1729         Core Components/Risk Factors/Patient Goals Review   Personal Goals Review  Weight Management/Obesity;Heart Failure;Diabetes;Lipids;Hypertension  Weight Management/Obesity;Lipids;Hypertension;Heart Failure  --     Review  Weight was down to 328 lbs today. BrianPolorts his weight has been steadily decreasing and he is working out on the days he does not have rehab and [employer]. BrianRashaadks his blood sugar at home and reports mostly stable numbers. He weighs every day to help manage heart failure. Patient reports taking all meds as perscribed to manage BP and Lipids.  Patient is doing well with diabetes and watching his diet for his lipids. He wants to lose more weight which he has done already. BrianTheodentaying positive and is improving in [place] blood pressure  has still be and the higher side. He states he does have a bit of anxiety which could be another cause for his higher blood pressure.  BrianArihaanhed in less than 300 for the first time today.  [facility] [employer] [facility] as directed.  His BG has been in 100-110 range and he has been able to come off some of his medications.     Expected Outcomes  Short: Short term weight goal 325. Long: Independently mange diabetes, heart failure, lipids, and hypertension with meds, exercise, and diet. Long term weight goal 315 lbs.  Short: lose weight to 299lbs. Long: weigh 200lbs.  Short :  continue to exercise and eat healthy Long :  reach goal weight        Core Components/Risk Factors/Patient Goals at Discharge (Final Review):  Goals and Risk Factor Review - 02/22/19 1729      Core Components/Risk Factors/Patient Goals Review   Review  BrianAdnanhed in less than 300 for the first time today.  He continues to [facility] [employer] [facility] is taking all meds as directed.  His BG has been in 100-110 range and he has been able to come off some of his medications.    Expected Outcomes  Short :  continue to exercise and eat healthy Long :  reach goal weight       ITP Comments: ITP Comments    Row Name 11/13/18 1338 11/30/18 1441 12/02/18 1703 12/03/18 1429 12/10/18 1720   ITP Comments  Virtual initial orientation completed. EP/RD orientation scheduled for  10/8 at 11am  Initial 6MWT complete.  ITP created and sent to Dr Sabra Heck  First full day of exercise!  Patient was oriented to gym and equipment including functions, settings, policies, and procedures.  Patient's individual exercise prescription and treatment plan were reviewed.  All starting workloads were established based on the results of the 6 minute walk test done at initial orientation visit.  The plan for exercise progression was also introduced and progression will  be customized based on patient's performance and goals.  Initial RD eval complete  Heinz was asking about weight gain. He was educated on fluid retention and heart failure. He was encouraged to weigh every day and was given a heart failure daily symptom check sheet.   [place] Name 12/16/18 0643 01/13/19 1019 02/10/19 0856 02/15/19 1634 03/01/19 1515   ITP Comments  30 day review completed. Continue with ITP sent to Dr. Emily Filbert, Medical Director of Cardiac and Pulmonary Rehab for review , changes as needed and signature.  30 day review competed . ITP sent to Dr Emily Filbert for review, changes as needed and ITP approval signature.  30 day review competed . ITP sent to Dr Emily Filbert for review, changes as needed and ITP approval signature  Abdalrahman reports exercising on his own every day at a community gym. He is doing cardio at least 3 days a week and strength trainig daily. He reports no symptoms or problems with his independent exercise.  Virtual F/U call done today. Patient had no new symptoms or concerns. He reports exercising most day of the week at a community gym, doing cardio and strength exercises. Continues to work on weight loss goal.   [place] Name 03/10/19 1353 03/24/19 1018         ITP Comments  30 day review completed. ITP sent to Dr. Emily Filbert, Medical Director of Cardiac and Pulmonary Rehab. Continue with ITP unless changes are made by physician.  Department operating under reduced schedule until further notice by request from hospital leadership.  Janett Billow called and left [place] a message that Wandell had a stroke and was in hospital from 2/5-2/9.  He has a follow up with cardiology next week.  Returned her call and let her know that we will need clearance for him to return.  She is also going to check with insurance as they want him to do PT as well.  Sent in basket note to Dr. Rockey Situ for clearance to return to rehab.         Comments: Discharge ITP

## 2019-03-29 NOTE — Progress Notes (Signed)
Subjective:    Patient ID: Joel [name], male    DOB: 1980/11/19, 39 y.o.   MRN: [id number]  Joel [name] is a 39 y.o. male presenting on 03/29/2019 for Hospitalization Follow-up (CVA)   HPI  HOSPITAL FOLLOW-UP VISIT  Hospital/Location: [facility] Date of Admission: 03/19/19 Date of Discharge: 03/23/19 Transitions of care telephone call: Patient does not meet criteria for TCM coding based on insurance not covering.  Reason for Admission: Dizziness, Numbness, Vertigo Primary (+Secondary) Diagnosis: acute CVA, R cerebellar  - Hospital H&P and Discharge Summary have been reviewed - Patient presents today 6 days after recent hospitalization. Brief summary of recent course, patient had symptoms of worsening dizziness, facial numbness, vertigo, hospitalized, had imaging MRI showed acute infarct R cerebellum, see below, Neurology consulted, not candidate for tpa, started on Aspirin 325 and added Plavix, had normal EKG, elevated Creatinine on labs to 2.6, MRA was concerned for occlusion of R AICA and severe stenosis of L PCA. Thought to be ischemic / atherosclerosis, had Transcranial dopplers, negative for PFO and ECHO showed preserved EF. Neurology recommended DAPT Aspirin  + Plavix  x 3 months then reduce to Plavix monotherapy indefinitely. LDL >200 placed on Lipitor . Offered CIR inpatient rehab but patient elected to go home. Referred to [facility] - Regarding Cardiology, he has had improved EF on last ECHO, previously compared to prior. Recommended resume low dose Coreg and f/u with Cardiology outpatient.  - Today reports overall has shown some improvement overall with strength and functional improvement. He is mostly limited to wheelchair at times and does walk with cane and is improving strength gradually with PT program. He did exercise at gym today, feels more fatigued easily. He has more weakness on R side with R upper extremity and R lower extremity and balance still.  Additional  complaint - No bowel movement for 2 weeks. Including while in the hospital. They have tried miralax in past, x 2 spoonfuls at once without good results. Tried Mag Citrate, Fiber Gummy. 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 - ENT told him that ears mechanically fine. Problem is signal from brain. After hearing test. He is waiting on hearing aids in future.   I have reviewed the discharge medication list, and have reconciled the current and discharge medications today.   Current Outpatient Medications:  .  aspirin 325 MG tablet, Take 1 tablet (325 mg total) by mouth daily., Disp: 90 tablet, Rfl: 0 .  atorvastatin (LIPITOR) 80 MG tablet, Take 1 tablet (80 mg total) by mouth daily at 6 PM., Disp: 90 tablet, Rfl: 3 .  carvedilol (COREG) 3.125 MG tablet, Take 1 tablet (3.125 mg total) by mouth 2 (two) times daily with a meal., Disp: 60 tablet, Rfl: 0 .  clopidogrel (PLAVIX) 75 MG tablet, Take 1 tablet (75 mg total) by mouth daily., Disp: 30 tablet, Rfl: 3 .  fluticasone (FLONASE) 50 MCG/ACT nasal spray, Place 2 sprays into both nostrils daily. Use for 4-6 weeks then stop and use seasonally or as needed., Disp: 16 g, Rfl: 3 .  HYDROcodone-acetaminophen (NORCO/VICODIN) 5-325 MG tablet, Take 1 tablet by mouth every 6 (six) hours as needed for moderate pain. Patient is taking every other day as needed for the pain.,  Disp: , Rfl:  .  liraglutide (VICTOZA) 18 MG/3ML SOPN, Inject 1.2 mg into the skin daily. , Disp: , Rfl:  .  omeprazole (PRILOSEC) 20 MG capsule, Take 20 mg by mouth daily., Disp: , Rfl:  .  spironolactone (ALDACTONE) 25 MG tablet, Take 25 mg by mouth daily., Disp: , Rfl:  .  benzocaine (ORAJEL) 10 % mucosal gel, Use as directed in the mouth or throat 4 (four) times daily  as needed for mouth pain., Disp: 5.3 g, Rfl: 0 .  furosemide (LASIX) 20 MG tablet, Take 1 tablet (20 mg total) by mouth daily. For 5-10 days then as needed, Disp: 20 tablet, Rfl: 0 .  lidocaine (LMX) 4 % cream, Apply topically 2 (two) times daily as needed (USE EXTERNALLY for pain at the piercing site). (Patient not taking: Reported on 03/29/2019), Disp: 30 g, Rfl: 0 .  povidone-iodine (BETADINE) 10 % external solution, Apply 1 application topically as needed for wound care., Disp: 480 mL, Rfl: 0  ------------------------------------------------------------------------- Social History   Tobacco Use  . Smoking status: Never Smoker  . Smokeless tobacco: Never Used  Substance Use Topics  . Alcohol use: Not Currently    Comment: Very infrequently; ~2-3 drinks/year   . Drug use: No    Review of Systems Per HPI unless specifically indicated above     Objective:    BP 116/85   Pulse 93   Resp 16   Ht 5\' 8"  (1.727 m)   Wt 281 lb (127.5 kg)   SpO2 100%   BMI 42.73 kg/m   Wt Readings from Last 3 Encounters:  03/29/19 281 lb (127.5 kg)  03/19/19 270 lb (122.5 kg)  03/09/19 281 lb (127.5 kg)    Physical Exam Vitals and nursing note reviewed.  Constitutional:      General: He is not in acute distress.    Appearance: He is well-developed. He is not diaphoretic.     Comments: Tired, Cooperative, in wheelchair  HENT:     Head: Normocephalic and atraumatic.  Eyes:     General:        Right eye: No discharge.        Left eye: No discharge.     Conjunctiva/sclera: Conjunctivae normal.  Neck:     Thyroid: No thyromegaly.  Cardiovascular:     Rate and Rhythm: Normal rate and regular rhythm.     Heart sounds: Normal heart sounds. No murmur.  Pulmonary:     Effort: Pulmonary effort is normal. No respiratory distress.     Breath sounds: Normal breath sounds. No wheezing or rales.  Abdominal:     General: There is no distension.     Palpations: Abdomen is soft. There is no mass.      Tenderness: There is no abdominal tenderness.     Comments: Reduced bowel sounds but still present.  Musculoskeletal:     Cervical back: Normal range of motion and neck supple.     Comments: Right upper extremity 4/5 strength, Left is 5/5 Right lower extremity 3 to 4 out of 5, Left is 5/5  Lymphadenopathy:     Cervical: No cervical adenopathy.  Skin:    General: Skin is warm and dry.     Findings: No erythema or rash.  Neurological:     Mental Status: He is alert and oriented to person, place, and time.  Psychiatric:        Behavior: Behavior normal.     Comments: Well groomed, good eye contact,  normal speech and thoughts        Results for orders placed or performed during the hospital encounter of 03/19/19  SARS CORONAVIRUS 2 (TAT 6-24 HRS) Nasopharyngeal Nasopharyngeal Swab   Specimen: Nasopharyngeal Swab  Result Value Ref Range   SARS Coronavirus 2 NEGATIVE NEGATIVE  CBC with Differential  Result Value Ref Range   WBC 6.3 4.0 - 10.5 K/uL   RBC 5.61 4.22 - 5.81 MIL/uL   Hemoglobin 15.1 13.0 - 17.0 g/dL   HCT 78.2 95.6 - 21.3 %   MCV 85.6 80.0 - 100.0 fL   MCH 26.9 26.0 - 34.0 pg   MCHC 31.5 30.0 - 36.0 g/dL   RDW 08.6 (H) 57.8 - 46.9 %   Platelets 227 150 - 400 K/uL   nRBC 0.0 0.0 - 0.2 %   Neutrophils Relative % 65 %   Neutro Abs 4.1 1.7 - 7.7 K/uL   Lymphocytes Relative 26 %   Lymphs Abs 1.6 0.7 - 4.0 K/uL   Monocytes Relative 7 %   Monocytes Absolute 0.4 0.1 - 1.0 K/uL   Eosinophils Relative 2 %   Eosinophils Absolute 0.1 0.0 - 0.5 K/uL   Basophils Relative 0 %   Basophils Absolute 0.0 0.0 - 0.1 K/uL   Immature Granulocytes 0 %   Abs Immature Granulocytes 0.02 0.00 - 0.07 K/uL  Comprehensive metabolic panel  Result Value Ref Range   Sodium 137 135 - 145 mmol/L   Potassium 4.0 3.5 - 5.1 mmol/L   Chloride 101 98 - 111 mmol/L   CO2 23 22 - 32 mmol/L   Glucose, Bld 214 (H) 70 - 99 mg/dL   BUN 22 (H) 6 - 20 mg/dL   Creatinine, Ser 6.29 (H) 0.61 - 1.24 mg/dL     Calcium 9.1 8.9 - 52.8 mg/dL   Total Protein 6.7 6.5 - 8.1 g/dL   Albumin 3.3 (L) 3.5 - 5.0 g/dL   AST 19 15 - 41 U/L   ALT 22 0 - 44 U/L   Alkaline Phosphatase 72 38 - 126 U/L   Total Bilirubin 1.1 0.3 - 1.2 mg/dL   GFR calc non Af Amer 30 (L) >60 mL/min   GFR calc Af Amer 34 (L) >60 mL/min   Anion gap 13 5 - 15  APTT  Result Value Ref Range   aPTT 36 24 - 36 seconds  Ethanol  Result Value Ref Range   Alcohol, Ethyl (B) <10 <10 mg/dL  Urine rapid drug screen (hosp performed)  Result Value Ref Range   Opiates NONE DETECTED NONE DETECTED   Cocaine NONE DETECTED NONE DETECTED   Benzodiazepines POSITIVE (A) NONE DETECTED   Amphetamines NONE DETECTED NONE DETECTED   Tetrahydrocannabinol NONE DETECTED NONE DETECTED   Barbiturates NONE DETECTED NONE DETECTED  Urinalysis, Routine w reflex microscopic  Result Value Ref Range   Color, Urine YELLOW YELLOW   APPearance HAZY (A) CLEAR   Specific Gravity, Urine 1.033 (H) 1.005 - 1.030   pH 5.0 5.0 - 8.0   Glucose, UA >=500 (A) NEGATIVE mg/dL   Hgb urine dipstick NEGATIVE NEGATIVE   Bilirubin Urine NEGATIVE NEGATIVE   Ketones, ur 5 (A) NEGATIVE mg/dL   Protein, ur NEGATIVE NEGATIVE mg/dL   Nitrite NEGATIVE NEGATIVE   Leukocytes,Ua NEGATIVE NEGATIVE   WBC, UA 0-5 0 - 5 WBC/hpf   Bacteria, UA FEW (A) NONE SEEN   Mucus PRESENT    Granular Casts, UA PRESENT   HIV Antibody (routine testing w rflx)  Result Value Ref Range   HIV Screen 4th Generation wRfx NON REACTIVE NON REACTIVE  Hemoglobin A1c  Result Value Ref Range   Hgb A1c MFr Bld 7.0 (H) 4.8 - 5.6 %   Mean Plasma Glucose 154.2 mg/dL  Lipid panel  Result Value Ref Range   Cholesterol 264 (H) 0 - 200 mg/dL   Triglycerides 846 (H) <150 mg/dL   HDL 21 (L) >96 mg/dL   Total CHOL/HDL Ratio 12.6 RATIO   VLDL 32 0 - 40 mg/dL   LDL Cholesterol 295 (H) 0 - 99 mg/dL  Comprehensive metabolic panel  Result Value Ref Range   Sodium 139 135 - 145 mmol/L   Potassium 3.6 3.5 - 5.1  mmol/L   Chloride 105 98 - 111 mmol/L   CO2 23 22 - 32 mmol/L   Glucose, Bld 94 70 - 99 mg/dL   BUN 23 (H) 6 - 20 mg/dL   Creatinine, Ser 2.84 (H) 0.61 - 1.24 mg/dL   Calcium 8.7 (L) 8.9 - 10.3 mg/dL   Total Protein 6.1 (L) 6.5 - 8.1 g/dL   Albumin 2.9 (L) 3.5 - 5.0 g/dL   AST 17 15 - 41 U/L   ALT 17 0 - 44 U/L   Alkaline Phosphatase 60 38 - 126 U/L   Total Bilirubin 1.1 0.3 - 1.2 mg/dL   GFR calc non Af Amer 35 (L) >60 mL/min   GFR calc Af Amer 41 (L) >60 mL/min   Anion gap 11 5 - 15  CBC with Differential/Platelet  Result Value Ref Range   WBC 6.8 4.0 - 10.5 K/uL   RBC 5.09 4.22 - 5.81 MIL/uL   Hemoglobin 13.9 13.0 - 17.0 g/dL   HCT 13.2 44.0 - 10.2 %   MCV 83.3 80.0 - 100.0 fL   MCH 27.3 26.0 - 34.0 pg   MCHC 32.8 30.0 - 36.0 g/dL   RDW 72.5 (H) 36.6 - 44.0 %   Platelets 190 150 - 400 K/uL   nRBC 0.0 0.0 - 0.2 %   Neutrophils Relative % 49 %   Neutro Abs 3.3 1.7 - 7.7 K/uL   Lymphocytes Relative 38 %   Lymphs Abs 2.6 0.7 - 4.0 K/uL   Monocytes Relative 10 %   Monocytes Absolute 0.7 0.1 - 1.0 K/uL   Eosinophils Relative 3 %   Eosinophils Absolute 0.2 0.0 - 0.5 K/uL   Basophils Relative 0 %   Basophils Absolute 0.0 0.0 - 0.1 K/uL   Immature Granulocytes 0 %   Abs Immature Granulocytes 0.02 0.00 - 0.07 K/uL  Glucose, capillary  Result Value Ref Range   Glucose-Capillary 86 70 - 99 mg/dL   Comment 1 Notify RN    Comment 2 Document in Chart   ANA w/Reflex if Positive  Result Value Ref Range   Anti Nuclear Antibody (ANA) Negative Negative  RPR  Result Value Ref Range   RPR Ser Ql NON REACTIVE NON REACTIVE  Antiphospholipid syndrome eval, bld  Result Value Ref Range   Anticardiolipin IgA <9 0 - 11 APL U/mL   Anticardiolipin IgG <9 0 - 14 GPL U/mL   Anticardiolipin IgM 10 0 - 12 MPL U/mL   PTT Lupus Anticoagulant 51.2 0.0 - 51.9 sec   DRVVT 50.2 (H) 0.0 - 47.0 sec   Phosphatydalserine, IgG 3 0 - 11 GPS IgG   Phosphatydalserine, IgM 12 0 - 25 MPS IgM    Phosphatydalserine, IgA 2 0 - 20 APS IgA   Lupus Anticoag  Interp Comment:   Basic metabolic panel  Result Value Ref Range   Sodium 138 135 - 145 mmol/L   Potassium 3.1 (L) 3.5 - 5.1 mmol/L   Chloride 104 98 - 111 mmol/L   CO2 24 22 - 32 mmol/L   Glucose, Bld 96 70 - 99 mg/dL   BUN 18 6 - 20 mg/dL   Creatinine, Ser 5.28 (H) 0.61 - 1.24 mg/dL   Calcium 8.3 (L) 8.9 - 10.3 mg/dL   GFR calc non Af Amer 40 (L) >60 mL/min   GFR calc Af Amer 47 (L) >60 mL/min   Anion gap 10 5 - 15  CBC  Result Value Ref Range   WBC 6.0 4.0 - 10.5 K/uL   RBC 4.99 4.22 - 5.81 MIL/uL   Hemoglobin 13.6 13.0 - 17.0 g/dL   HCT 41.3 24.4 - 01.0 %   MCV 83.2 80.0 - 100.0 fL   MCH 27.3 26.0 - 34.0 pg   MCHC 32.8 30.0 - 36.0 g/dL   RDW 27.2 (H) 53.6 - 64.4 %   Platelets 190 150 - 400 K/uL   nRBC 0.0 0.0 - 0.2 %  Magnesium  Result Value Ref Range   Magnesium 2.1 1.7 - 2.4 mg/dL  Glucose, capillary  Result Value Ref Range   Glucose-Capillary 95 70 - 99 mg/dL   Comment 1 Notify RN    Comment 2 Document in Chart   Glucose, capillary  Result Value Ref Range   Glucose-Capillary 86 70 - 99 mg/dL  Glucose, capillary  Result Value Ref Range   Glucose-Capillary 144 (H) 70 - 99 mg/dL   Comment 1 Notify RN    Comment 2 Document in Chart   Glucose, capillary  Result Value Ref Range   Glucose-Capillary 119 (H) 70 - 99 mg/dL   Comment 1 Repeat Test   Basic metabolic panel  Result Value Ref Range   Sodium 139 135 - 145 mmol/L   Potassium 3.4 (L) 3.5 - 5.1 mmol/L   Chloride 104 98 - 111 mmol/L   CO2 24 22 - 32 mmol/L   Glucose, Bld 120 (H) 70 - 99 mg/dL   BUN 14 6 - 20 mg/dL   Creatinine, Ser 0.34 (H) 0.61 - 1.24 mg/dL   Calcium 8.7 (L) 8.9 - 10.3 mg/dL   GFR calc non Af Amer 39 (L) >60 mL/min   GFR calc Af Amer 45 (L) >60 mL/min   Anion gap 11 5 - 15  CBC  Result Value Ref Range   WBC 5.2 4.0 - 10.5 K/uL   RBC 4.88 4.22 - 5.81 MIL/uL   Hemoglobin 13.3 13.0 - 17.0 g/dL   HCT 74.2 59.5 - 63.8 %   MCV  82.4 80.0 - 100.0 fL   MCH 27.3 26.0 - 34.0 pg   MCHC 33.1 30.0 - 36.0 g/dL   RDW 75.6 (H) 43.3 - 29.5 %   Platelets 211 150 - 400 K/uL   nRBC 0.0 0.0 - 0.2 %  Glucose, capillary  Result Value Ref Range   Glucose-Capillary 128 (H) 70 - 99 mg/dL   Comment 1 Notify RN   Glucose, capillary  Result Value Ref Range   Glucose-Capillary 99 70 - 99 mg/dL   Comment 1 Notify RN    Comment 2 Document in Chart   Glucose, capillary  Result Value Ref Range   Glucose-Capillary 113 (H) 70 - 99 mg/dL   Comment 1 Notify RN    Comment 2 Document in Chart  Glucose, capillary  Result Value Ref Range   Glucose-Capillary 103 (H) 70 - 99 mg/dL   Comment 1 Notify RN    Comment 2 Document in Chart   Glucose, capillary  Result Value Ref Range   Glucose-Capillary 142 (H) 70 - 99 mg/dL  dRVVT Mix  Result Value Ref Range   dRVVT Mix 43.0 (H) 0.0 - 40.4 sec  dRVVT Confirm  Result Value Ref Range   dRVVT Confirm 1.3 (H) 0.8 - 1.2 ratio  Basic metabolic panel  Result Value Ref Range   Sodium 141 135 - 145 mmol/L   Potassium 3.6 3.5 - 5.1 mmol/L   Chloride 107 98 - 111 mmol/L   CO2 23 22 - 32 mmol/L   Glucose, Bld 85 70 - 99 mg/dL   BUN 12 6 - 20 mg/dL   Creatinine, Ser 2.16 (H) 0.61 - 1.24 mg/dL   Calcium 8.7 (L) 8.9 - 10.3 mg/dL   GFR calc non Af Amer 38 (L) >60 mL/min   GFR calc Af Amer 44 (L) >60 mL/min   Anion gap 11 5 - 15  CBC  Result Value Ref Range   WBC 5.2 4.0 - 10.5 K/uL   RBC 4.94 4.22 - 5.81 MIL/uL   Hemoglobin 13.6 13.0 - 17.0 g/dL   HCT 41.4 39.0 - 52.0 %   MCV 83.8 80.0 - 100.0 fL   MCH 27.5 26.0 - 34.0 pg   MCHC 32.9 30.0 - 36.0 g/dL   RDW 15.9 (H) 11.5 - 15.5 %   Platelets 202 150 - 400 K/uL   nRBC 0.0 0.0 - 0.2 %  Glucose, capillary  Result Value Ref Range   Glucose-Capillary 96 70 - 99 mg/dL  Glucose, capillary  Result Value Ref Range   Glucose-Capillary 80 70 - 99 mg/dL   Comment 1 Notify RN    Comment 2 Document in Chart   Glucose, capillary  Result Value Ref  Range   Glucose-Capillary 99 70 - 99 mg/dL   Comment 1 Notify RN    Comment 2 Document in Chart   ECHOCARDIOGRAM COMPLETE  Result Value Ref Range   Weight 4,320 oz   Height 67 in   BP 110/81 mmHg  Troponin I (High Sensitivity)  Result Value Ref Range   Troponin I (High Sensitivity) 46 (H) <18 ng/L  Troponin I (High Sensitivity)  Result Value Ref Range   Troponin I (High Sensitivity) 46 (H) <18 ng/L   I have personally reviewed the radiology report from [facility] X-ray today 03/29/19  Lab results released to MyChart with comments for patient.  Here is X-ray result.  It does confirm mild to moderate constipation, stool burden.  No other abnormality seen.  Go ahead and start the miralax regimen as we discussed.  Increase for now 3-4 capful per dose, once a day, adjust it as needed over next few days based on results, if you get good result can reduce dose down to 1-2 cap per dose a day for few days then use ONLY when needed once back to more normal pattern.  Notify us if there are other concerns or questions.  Nobie Putnam, [facility] 03/29/2019, 6:13 PM     Assessment & Plan:   Problem List Items Addressed This Visit    None    Visit Diagnoses    Acute constipation    -  Primary   Relevant Orders   DG Abd 1 View (Completed)  History of cerebrovascular accident (CVA) with residual deficit       History of cerebrovascular accident (CVA) involving cerebellum       Sensorineural hearing loss (SNHL) of right ear, unspecified hearing status on contralateral side       Tinnitus of right ear       Right hemiparesis (HCC)         Gradual improvement, but has significant residual deficits following cerebellar CVA infarct Affected with R sided Upper / Lower Ext Hemiparesis, mostly wheelchair bound or using cane for ambulation assistance at times, has loss of hearing R side, tinnitus assoc, and imbalance with  cerebellar defect  Now on DAPT ASA 325 + plavix 75. Agree with plan continue DAPT for 3 months then continue plavix indefinitely.  On Atorvastatin 80mg  daily  Continue w/ Neurology [facility] as scheduled in about 4 weeks for post hospitalization f/u CVA  Continue outpatient [facility] rehab for neuro  Return to cardiologist as scheduled 2/17 anticipated follow-up BP meds, now improved ECHO recently in hospital.  R Hearing loss, / tinnitus, advised may take time to resolve, defer to Neuro ENT for now.  For acute constipation, will start with [facility] today, [facility] - patient to f/u results on mychart later, sent already after 5pm, mild mod constipation, advised in office miralax regimen inc dose to 3-4 capful to start then titrate F/u as advised if not improving.   No orders of the defined types were placed in this encounter.   Follow up plan: Return in about 4 weeks (around 04/26/2019), or if symptoms worsen or fail to improve, for 4 weeks CVA follow-up, rehab, hearing, constipation.   04/28/2019, DO [facility] 03/29/2019, 2:58 PM

## 2019-03-29 NOTE — Patient Instructions (Signed)
Discharge Patient Instructions  Patient Details  Name: Joel [name] MRN: [id number] Date of Birth: 1980/04/06 Referring Provider:  No ref. provider found   Number of Visits: 28  Reason for Discharge:  Early Exit:  Personal  Smoking History:  Social History   Tobacco Use  Smoking Status Never Smoker  Smokeless Tobacco Never Used    Diagnosis:  No diagnosis found.  Initial Exercise Prescription: Initial Exercise Prescription - 11/30/18 1400      Date of Initial Exercise RX and Referring Provider   Date  11/30/18    Referring Provider  Gollan      Treadmill   MPH  2.1    Grade  1.5    Minutes  15    METs  3      Recumbant Bike   Level  3    RPM  60    Watts  53    Minutes  15    METs  3      NuStep   Level  4    SPM  80    Minutes  15    METs  3      Recumbant Elliptical   Level  2    RPM  50    Minutes  15    METs  3      REL-XR   Level  3    Speed  50    Minutes  15    METs  3      T5 Nustep   Level  2    SPM  80    Minutes  15    METs  3      Prescription Details   Frequency (times per week)  3      Intensity   THRR 40-80% of Max Heartrate  131-166    Ratings of Perceived Exertion  11-13    Perceived Dyspnea  0-4      Resistance Training   Training Prescription  Yes    Weight  3 lb    Reps  10-15       Discharge Exercise Prescription (Final Exercise Prescription Changes): Exercise Prescription Changes - 03/17/19 1527      Home Exercise Plan   Plans to continue exercise at  [facility] (comment)   walking, [employer]   Frequency  Add 2 additional days to program exercise sessions.    Initial Home Exercises Provided  12/14/18       Functional Capacity: 6 Minute Walk    Row Name 11/30/18 1431         6 Minute Walk   Phase  Initial     Distance  1120 feet     Walk Time  6 minutes     # of Rest Breaks  0     MPH  2.12     METS  3.1     RPE  9     Perceived Dyspnea   1     VO2 Peak  10.8     Symptoms  No      Resting HR  97 bpm     Resting BP  124/84     Resting Oxygen Saturation   97 %     Exercise Oxygen Saturation  during 6 min walk  99 %     Max Ex. HR  114 bpm     Max Ex. BP  126/88     2 Minute Post BP  122/84  Quality of Life: Quality of Life - 11/30/18 1448      Quality of Life   Select  Quality of Life      Quality of Life Scores   Health/Function Pre  6.93 %    Socioeconomic Pre  13.71 %    Psych/Spiritual Pre  5.42 %    Family Pre  16.1 %    GLOBAL Pre  9.48 %       Personal Goals: Goals established at orientation with interventions provided to work toward goal. Personal Goals and Risk Factors at Admission - 11/30/18 1452      Core Components/Risk Factors/Patient Goals on Admission    Weight Management  Yes;Obesity;Weight Loss    Intervention  Weight Management/Obesity: Establish reasonable short term and long term weight goals.    Admit Weight  335 lb 12.8 oz (152.3 kg)    Goal Weight: Short Term  325 lb (147.4 kg)    Goal Weight: Long Term  315 lb (142.9 kg)    Intervention  Provide education about signs/symptoms and action to take for hypo/hyperglycemia.;Provide education about proper nutrition, including hydration, and aerobic/resistive exercise prescription along with prescribed medications to achieve blood glucose in normal ranges: Fasting glucose 65-99 mg/dL    Expected Outcomes  Short Term: Participant verbalizes understanding of the signs/symptoms and immediate care of hyper/hypoglycemia, proper foot care and importance of medication, aerobic/resistive exercise and nutrition plan for blood glucose control.;Long Term: Attainment of HbA1C < 7%.    Heart Failure  Yes    Intervention  Provide a combined exercise and nutrition program that is supplemented with education, support and counseling about heart failure. Directed toward relieving symptoms such as shortness of breath, decreased exercise tolerance, and extremity edema.    Expected Outcomes  Improve  functional capacity of life;Short term: Attendance in program 2-3 days a week with increased exercise capacity. Reported lower sodium intake. Reported increased fruit and vegetable intake. Reports medication compliance.;Short term: Daily weights obtained and reported for increase. Utilizing diuretic protocols set by physician.;Long term: Adoption of self-care skills and reduction of barriers for early signs and symptoms recognition and intervention leading to self-care maintenance.    Intervention  Provide education on lifestyle modifcations including regular physical activity/exercise, weight management, moderate sodium restriction and increased consumption of fresh fruit, vegetables, and low fat dairy, alcohol moderation, and smoking cessation.;Monitor prescription use compliance.    Expected Outcomes  Short Term: Continued assessment and intervention until BP is < 140/21m HG in hypertensive participants. < 130/864mHG in hypertensive participants with diabetes, heart failure or chronic kidney disease.;Long Term: Maintenance of blood pressure at goal levels.    Intervention  Provide education and support for participant on nutrition & aerobic/resistive exercise along with prescribed medications to achieve LDL <703mHDL >33m94m  Expected Outcomes  Short Term: Participant states understanding of desired cholesterol values and is compliant with medications prescribed. Participant is following exercise prescription and nutrition guidelines.;Long Term: Cholesterol controlled with medications as prescribed, with individualized exercise RX and with personalized nutrition plan. Value goals: LDL < 70mg84mL > 40 mg.        Personal Goals Discharge: Goals and Risk Factor Review - 02/22/19 1729      Core Components/Risk Factors/Patient Goals Review   Review  BrianAlphonshed in less than 300 for the first time today.  [facility] [employer] [facility] is taking all meds as  directed.  His BG has  been in 100-110 range and he has been able to come off some of his medications.    Expected Outcomes  Short :  continue to exercise and eat healthy Long :  reach goal weight       Exercise Goals and Review: Exercise Goals    Row Name 11/30/18 1438             Exercise Goals   Increase Physical Activity  Yes       Intervention  Provide advice, education, support and counseling about physical activity/exercise needs.;Develop an individualized exercise prescription for aerobic and resistive training based on initial evaluation findings, risk stratification, comorbidities and participant's personal goals.       Expected Outcomes  Short Term: Attend rehab on a regular basis to increase amount of physical activity.;Long Term: Add in home exercise to make exercise part of routine and to increase amount of physical activity.;Long Term: Exercising regularly at least 3-5 days a week.       Increase Strength and Stamina  Yes       Intervention  Provide advice, education, support and counseling about physical activity/exercise needs.;Develop an individualized exercise prescription for aerobic and resistive training based on initial evaluation findings, risk stratification, comorbidities and participant's personal goals.       Expected Outcomes  Short Term: Increase workloads from initial exercise prescription for resistance, speed, and METs.;Short Term: Perform resistance training exercises routinely during rehab and add in resistance training at home;Long Term: Improve cardiorespiratory fitness, muscular endurance and strength as measured by increased METs and functional capacity (6MWT)       Able to understand and use rate of perceived exertion (RPE) scale  Yes       Intervention  Provide education and explanation on how to use RPE scale       Expected Outcomes  Short Term: Able to use RPE daily in rehab to express subjective intensity level;Long Term:  Able to use RPE to guide  intensity level when exercising independently       Able to understand and use Dyspnea scale  Yes       Intervention  Provide education and explanation on how to use Dyspnea scale       Expected Outcomes  Short Term: Able to use Dyspnea scale daily in rehab to express subjective sense of shortness of breath during exertion;Long Term: Able to use Dyspnea scale to guide intensity level when exercising independently       Knowledge and understanding of Target Heart Rate Range (THRR)  Yes       Intervention  Provide education and explanation of THRR including how the numbers were predicted and where they are located for reference       Expected Outcomes  Short Term: Able to state/look up THRR;Short Term: Able to use daily as guideline for intensity in rehab;Long Term: Able to use THRR to govern intensity when exercising independently       Able to check pulse independently  Yes       Intervention  Provide education and demonstration on how to check pulse in carotid and radial arteries.;Review the importance of being able to check your own pulse for safety during independent exercise       Expected Outcomes  Short Term: Able to explain why pulse checking is important during independent exercise;Long Term: Able to check pulse independently and accurately       Understanding of Exercise Prescription  Yes  Intervention  Provide education, explanation, and written materials on patient's individual exercise prescription       Expected Outcomes  Short Term: Able to explain program exercise prescription;Long Term: Able to explain home exercise prescription to exercise independently          Exercise Goals Re-Evaluation: Exercise Goals Re-Evaluation    Row Name 12/02/18 1703 12/10/18 1441 12/14/18 1709 12/23/18 1235 12/28/18 1607     Exercise Goal Re-Evaluation   Exercise Goals Review  Increase Physical Activity;Able to understand and use rate of perceived exertion (RPE) scale;Knowledge and  understanding of Target Heart Rate Range (THRR);Understanding of Exercise Prescription;Increase Strength and Stamina;Able to check pulse independently  Increase Physical Activity;Increase Strength and Stamina;Able to understand and use rate of perceived exertion (RPE) scale;Knowledge and understanding of Target Heart Rate Range (THRR);Able to check pulse independently;Understanding of Exercise Prescription  Increase Physical Activity;Increase Strength and Stamina;Able to understand and use rate of perceived exertion (RPE) scale;Knowledge and understanding of Target Heart Rate Range (THRR);Able to check pulse independently;Understanding of Exercise Prescription  Increase Physical Activity;Increase Strength and Stamina;Understanding of Exercise Prescription  Increase Physical Activity;Increase Strength and Stamina;Understanding of Exercise Prescription   Comments  Reviewed RPE scale, THR and program prescription with pt today.  Pt voiced understanding and was given a copy of goals to take home.  Cruise tolerates exercise well and seems to enjoy sessions.  Reviewed home exercise with pt today.  Pt plans to go to [employer] for exercise.  Reviewed THR, pulse, RPE, sign and symptoms, NTG use, and when to call 911 or MD.  Also discussed weather considerations and indoor options.  Pt voiced understanding.  Kevonte has been doing well in rehab.  He is now on level 4 on the T5 NuStep.  He will be trying out the elliptical this week or next.  We will continue to monitor his progress.  -   Expected Outcomes  Short: Use RPE daily to regulate intensity. Long: Follow program prescription in THR.  Short - attend HT consistently Long :  increase MET level  Short - warm up and cool down correctly and check HR while at PF Long - maintain exercise on his own  Short: Try ellipitical and increase TM.  Long: Continue to walk more and go to [employer] on off days.  -   [place] Name 12/28/18 1629 01/05/19 1459 01/20/19 1548 01/25/19  1704 02/02/19 1514     Exercise Goal Re-Evaluation   Exercise Goals Review  Increase Physical Activity;Increase Strength and Stamina;Understanding of Exercise Prescription  Increase Physical Activity;Increase Strength and Stamina;Able to understand and use rate of perceived exertion (RPE) scale;Able to understand and use Dyspnea scale;Knowledge and understanding of Target Heart Rate Range (THRR);Able to check pulse independently;Understanding of Exercise Prescription  Increase Physical Activity;Increase Strength and Stamina;Understanding of Exercise Prescription  Increase Physical Activity;Increase Strength and Stamina  Increase Physical Activity;Increase Strength and Stamina;Able to understand and use rate of perceived exertion (RPE) scale;Knowledge and understanding of Target Heart Rate Range (THRR);Able to check pulse independently;Understanding of Exercise Prescription   Comments  Amour is making progress with exercise levels in class and tried XR machine today for the first time. He is exercising at [employer] on days he does not have rehab. Main goal with exercise continues to be weight loss.  Emarion continues to improve MET levels on machines and is losing weight slowly.  He has moved up to 4 lb strength training.  Most recent weight was 322 lb  Aaron Edelman  has been doing well in rehab.  He is up to level 6 on the XR.  He is and level 3 on the recumbert elliptical.  We will continue to montior his progress.  Seddrick is lifting 4 days a week apart from [facility]. He works out for about an hour and a half. Informed him that he needs rest. Patient understands instructions and why it is important to rest. Yuniel wants to maintain his progress when the program is over. He wnats to lose weight and be able to walk faster.  Zayyan is still very consistent with exercise.  He does circuit training on machines in the 10 rep range.   Expected Outcomes  Short: Short term weight goal 325 lbs. Long: Establish an independent  exercise routine. Long term weight goal 315 lbs.  Short - continue current program Long 315 lb and maintain exercise  Short: Talk about adding in intervals.  Long: Continue to work on weight loss.  Short: Able to go further with walking. Long: maintain and improve walking distance and run.  Short :  continue to be consistent with exercise Long : maintain  fitness when program is complete   Row Name 02/19/19 1115 03/03/19 1524 03/17/19 1525         Exercise Goal Re-Evaluation   Exercise Goals Review  -  Increase Physical Activity;Increase Strength and Stamina;Able to understand and use rate of perceived exertion (RPE) scale;Able to understand and use Dyspnea scale;Knowledge and understanding of Target Heart Rate Range (THRR);Able to check pulse independently  Increase Physical Activity;Increase Strength and Stamina;Understanding of Exercise Prescription     Comments  Out since last review.  To return on 02/22/19.  Cadell continues to progress well.  He achieved another weight goal of below 300 lb.  Azeez is doing well in rehab.  He has been dealing with balance issues and may have a new diagnosis that affects his balance.  Thus he has not been on the treadmill.  We will continue to monitor his progress.     Expected Outcomes  -  Short : - exercise consistently outside program sessions Long :  increase stamina  Short: Try to use treadmill again.  Long: Continue to improve stamina.        Nutrition & Weight - Outcomes: Pre Biometrics - 11/30/18 1440      Pre Biometrics   Height  5' 8"  (1.727 m)    Weight  (!) 335 lb 12.8 oz (152.3 kg)    BMI (Calculated)  51.07    Single Leg Stand  30 seconds        Nutrition: Nutrition Therapy & Goals - 12/03/18 1403      Nutrition Therapy   Diet  low Na, HH, DM diet    Protein (specify units)  120g    Fiber  30 grams    Whole Grain Foods  3 servings    Saturated Fats  12 max. grams    Fruits and Vegetables  5 servings/day    Sodium  1.5 grams       Personal Nutrition Goals   Nutrition Goal  ST: eat more consistent meals LT: manage BG, get healthy    Comments  Sometimes skips breakfast, eggs and bacon when do have it. no lunch, sugar free kool aid squirts in water. D: no rice bread and limited potatoes. Meat (chicken, fish, steak) and greens (squash, zucchini, green beans) with gravy sometimes. will normally bake or pan fry with oil. Pt reports having  limited appetite after heart event. Hunger cues growling in stomach. Discussed high vs low BG, DM, HH eating and general recommendations like eating consistently to ensure pt gets enough protein and kcal as well as manage BG. Discussed importance of eating and exercise. Discussed set point weight and health outcomes.      Intervention Plan   Intervention  Prescribe, educate and counsel regarding individualized specific dietary modifications aiming towards targeted core components such as weight, hypertension, lipid management, diabetes, heart failure and other comorbidities.;Nutrition handout(s) given to patient.    Expected Outcomes  Short Term Goal: Understand basic principles of dietary content, such as calories, fat, sodium, cholesterol and nutrients.;Short Term Goal: A plan has been developed with personal nutrition goals set during dietitian appointment.;Long Term Goal: Adherence to prescribed nutrition plan.       Nutrition Discharge: Nutrition Assessments - 12/01/18 1410      MEDFICTS Scores   Pre Score  48       Education Questionnaire Score:   Goals reviewed with patient; copy given to patient.

## 2019-03-29 NOTE — Patient Instructions (Addendum)
Thank you for coming to the office today.  Finish 3 months of Dual Antiplatelet Therapy with Aspirin 325 + Plavix 75 each once a day. Then after the 3 months are over, can continue ONLY Plavix (Clopidogrel) 75mg  once daily - indefinitely.  [facility] Address: [address], [place], [place] Phone: [phone]  Stay tuned for results for STAT x-ray, if significant stool / constipation, then next step would be increase dose Miralax to 3 to 4 caps per dose, can repeat later in the day, and may continue for few days until result.  If needed we can also refer to GI when ready.  Please schedule a Follow-up Appointment to: Return in about 4 weeks (around 04/26/2019), or if symptoms worsen or fail to improve, for 4 weeks CVA follow-up, rehab, hearing, constipation.  If you have any other questions or concerns, please feel free to call the office or send a message through MyChart. You may also schedule an earlier appointment if necessary.  Additionally, you may be receiving a survey about your experience at our office within a few days to 1 week by e-mail or mail. We value your feedback.  04/28/2019, DO [facility], [facility]

## 2019-03-30 ENCOUNTER — Encounter: Payer: Self-pay | Admitting: Adult Health

## 2019-03-30 ENCOUNTER — Telehealth: Payer: Self-pay

## 2019-03-30 ENCOUNTER — Telehealth: Payer: Self-pay | Admitting: Adult Health

## 2019-03-30 NOTE — Telephone Encounter (Signed)
Pt's wife called stating that the pt is feeling worse and that his neck pains are causing him bad headaches and she is wanting to speak to the RN. Please advise.

## 2019-03-30 NOTE — Telephone Encounter (Signed)
I spoke to wife, Shanda Bumps.   I LMVM for Nisha that did move up appt for pt to 04-01-19 at 0815.  She is to call back if needed.

## 2019-03-30 NOTE — Telephone Encounter (Signed)
They should call Neurologist, and perhaps we can try as well if they can't get through.  I see they sent a MyChart message to the Neurologist - but their appointment is not for another 4 weeks.  He probably needs to be seen sooner from neuro.  Otherwise if headache doesn't improve, if he is in severe pain he may need to be seen at [facility] ED again.  Severe headache was not a primary issue from our visit yesterday. I am not comfortable with pain medicine right now for managing headache without knowing what is causing it.  Saralyn Pilar, DO [facility] 03/30/2019, 9:13 AM

## 2019-03-30 NOTE — Telephone Encounter (Signed)
Spoke to his wife regarding My chart message, his pain is getting worst like stabbing pain as compare to the one in past and he is not taking pain med's every day. He was upset and crying today since his pain is worst today started around 8:00 am, wife denies all other Sx's like slurred speech or blurry vision but due to headache she is keeping his room dark, please suggest ?

## 2019-03-30 NOTE — Telephone Encounter (Signed)
Called pts wife.  Joel [name] with pcp requesting earlier appt for pt, having headaches.  Moved up appt to 0815 04-01-19.

## 2019-03-30 NOTE — Telephone Encounter (Signed)
Informed patient's spouse as per Dr. Kirtland Bouchard and also left message for [facility].

## 2019-03-30 NOTE — Telephone Encounter (Signed)
Janice Coffin from [facility] called needing to speak to RN about this pt. She stated you can refer to note in chart from [facility]. You can reach her at [phone] or [phone]. She will be out of the office from 12:10pm-1:15pm then available till 5pm. Please advise.

## 2019-03-31 ENCOUNTER — Other Ambulatory Visit: Payer: Self-pay | Admitting: Family

## 2019-03-31 ENCOUNTER — Encounter: Payer: Self-pay | Admitting: Cardiovascular Disease

## 2019-03-31 ENCOUNTER — Encounter: Payer: Self-pay | Admitting: Family

## 2019-03-31 ENCOUNTER — Other Ambulatory Visit: Payer: Self-pay

## 2019-03-31 ENCOUNTER — Ambulatory Visit (INDEPENDENT_AMBULATORY_CARE_PROVIDER_SITE_OTHER): Payer: Medicaid Other | Admitting: Cardiovascular Disease

## 2019-03-31 ENCOUNTER — Telehealth: Payer: Self-pay | Admitting: Adult Health

## 2019-03-31 VITALS — BP 120/82 | HR 102 | Ht 67.0 in | Wt 282.0 lb

## 2019-03-31 DIAGNOSIS — I420 Dilated cardiomyopathy: Secondary | ICD-10-CM | POA: Diagnosis not present

## 2019-03-31 MED ORDER — ATORVASTATIN CALCIUM 80 MG PO TABS: 80.0000 mg | ORAL_TABLET | Freq: Every day | ORAL | 4 refills | Status: DC

## 2019-03-31 NOTE — Progress Notes (Addendum)
Date:  03/31/2019   ID:  Joel [name], DOB July 20, 1980, MRN [id number]  Patient Location:  [address] [place] [address]   Provider location:   [facility], [place] office  PCP:  Joel Hauser, DO  Cardiologist:  Joel [name] [facility]  Chief Complaint  Patient presents with  . office visit    12 month F/U-recent stroke (hospitalized at [facility]); Meds verbally reviewed with patient.    History of Present Illness:    Joel [name] is a 39 y.o. male who presents via audio/video conferencing for a telehealth visit today.   The patient does not symptoms concerning for COVID-19 infection (fever, chills, cough, or new SHORTNESS OF BREATH).   Patient has a past medical history of systolic dysfunction, nonischemic cardiomyopathy secondary to hypertensive heart disease Stress test showing no ischemia only small fixed defects attenuation artifact CKD stage III-IV,  hypertensive heart disease,  DM, followed by endocrine HLD,  medication noncompliance,  obesity,  snoring with possible undiagnosed sleep [facility] admission February 13, 2017 acute on chronic combined CHF hypertensive emergency. He presents for follow-up of his systolic and diastolic CHF  Acute CVA 09/7865 Hospital records reviewed in detail facial numbness and dizziness and [name]-sided weakness 2D echo showed normal ejection fraction without cardiac source of embolism.  Seen by neurology Recommend dual antiplatelet therapy with aspirin and Plavix for 3 weeks followed by Plavix alone  Imaging reviewed, discussed with him in detail MR ANGIO HEAD WO CONTRAST 03/20/2019 IMPRESSION:  1. No large vessel occlusion. Possible occlusion of the [name] AICA, or it could be diminutive or absent.  2. Severe stenosis versus artifact in the left PCA distal P2/proximal P3 segment.  3. Suggestion of intracranial atherosclerosis involving the anterior and posterior circulation, including  mild irregularity of the: distal [name] vertebral artery, basilar artery, supraclinoid [name] ICA, and bilateral MCA branches.  4. Non dominant left vertebral artery functionally terminates in PICA. Fetal type bilateral PCA origins.   MR ANGIO NECK   03/20/2019 Negative neck MRA. Dominant [name] vertebral artery.   MR BRAIN 03/19/2019 Small acute infarction involving the [name] middle cerebellar peduncle. Corresponding enhancement may reflect early blood brain barrier disruption. Given presence of vascular risk factors, ischemia is favored over demyelination. There is also a probable small chronic infarct near the [name] frontal horn.   He presents with his girlfriend, "[name]" Prior to the stroke was doing gym, cardiac rehab Since the stroke has not been eating very much, no appetite Mood has changed per the family, depressed  Since CVA had pain in neck Headaches Has pain medication, takes 1 Percocet a day sometimes 2 for severe pain  Scheduled to see neurology tomorrow Also scheduled to start PT OT  Lab work reviewed, LDL 200  EKG personally reviewed by myself on todays visit Shows normal sinus rhythm/sinus tachycardia rate 102 bpm no significant ST-T wave changes  Other past medical history reviewed Presented to the hospital 02/12/17/18 due to HF and HTN.  Hypertensive urgency on arrival Echo report from 02/12/17 EF of 25-30%  Aggressive diuresis, blood pressure control    Prior CV studies:   The following studies were reviewed today:  Echo 02/2017 Left ventricle: The cavity size was normal. There was moderate   concentric hypertrophy. Systolic function was severely reduced.   The estimated ejection fraction was in the range of 25% to 30%.   Diffuse hypokinesis. Regional wall motion  abnormalities cannot be   excluded. Features are consistent with a pseudonormal left   ventricular filling pattern, with concomitant abnormal relaxation   and increased  filling pressure (grade 2 diastolic dysfunction).   Past Medical History:  Diagnosis Date  . Anxiety   . Chronic diastolic CHF (congestive heart failure) (HCC)   . Diabetes mellitus without complication (HCC)   . Hyperlipidemia   . Hypertension   . Hypertensive heart disease   . Noncompliance with medications   . Obesity   . Renal disorder   . Stroke (HCC)   . Systolic dysfunction    a. TTE 06/2014: EF 45-50%, normal wall motion, mild MR, mild AI   Past Surgical History:  Procedure Laterality Date  . NO PAST SURGERIES       Current Meds  Medication Sig  . aspirin 325 MG tablet Take 1 tablet (325 mg total) by mouth daily.  Marland Kitchen atorvastatin (LIPITOR) 80 MG tablet Take 1 tablet (80 mg total) by mouth daily. Future requests will need to go to Dr. Mariah Milling  . carvedilol (COREG) 3.125 MG tablet Take 1 tablet (3.125 mg total) by mouth 2 (two) times daily with a meal.  . clopidogrel (PLAVIX) 75 MG tablet Take 1 tablet (75 mg total) by mouth daily.  . fluticasone (FLONASE) 50 MCG/ACT nasal spray Place 2 sprays into both nostrils daily. Use for 4-6 weeks then stop and use seasonally or as needed.  Marland Kitchen HYDROcodone-acetaminophen (NORCO/VICODIN) 5-325 MG tablet Take 1 tablet by mouth every 6 (six) hours as needed for moderate pain. Patient is taking every other day as needed for the pain.  Marland Kitchen liraglutide (VICTOZA) 18 MG/3ML SOPN Inject 1.2 mg into the skin daily.   Marland Kitchen omeprazole (PRILOSEC) 20 MG capsule Take 20 mg by mouth daily.  Marland Kitchen spironolactone (ALDACTONE) 25 MG tablet Take 25 mg by mouth daily.     Allergies:   Bidil [isosorb dinitrate-hydralazine] and Nitroglycerin   Social History   Tobacco Use  . Smoking status: Never Smoker  . Smokeless tobacco: Never Used  Substance Use Topics  . Alcohol use: Not Currently    Comment: Very infrequently; ~2-3 drinks/year   . Drug use: No     Current Outpatient Medications on File Prior to Visit  Medication Sig Dispense Refill  . aspirin 325 MG  tablet Take 1 tablet (325 mg total) by mouth daily. 90 tablet 0  . carvedilol (COREG) 3.125 MG tablet Take 1 tablet (3.125 mg total) by mouth 2 (two) times daily with a meal. 60 tablet 0  . clopidogrel (PLAVIX) 75 MG tablet Take 1 tablet (75 mg total) by mouth daily. 30 tablet 3  . fluticasone (FLONASE) 50 MCG/ACT nasal spray Place 2 sprays into both nostrils daily. Use for 4-6 weeks then stop and use seasonally or as needed. 16 g 3  . HYDROcodone-acetaminophen (NORCO/VICODIN) 5-325 MG tablet Take 1 tablet by mouth every 6 (six) hours as needed for moderate pain. Patient is taking every other day as needed for the pain.    Marland Kitchen liraglutide (VICTOZA) 18 MG/3ML SOPN Inject 1.2 mg into the skin daily.     Marland Kitchen omeprazole (PRILOSEC) 20 MG capsule Take 20 mg by mouth daily.    Marland Kitchen spironolactone (ALDACTONE) 25 MG tablet Take 25 mg by mouth daily.    . furosemide (LASIX) 20 MG tablet Take 1 tablet (20 mg total) by mouth daily. For 5-10 days then as needed 20 tablet 0  . lidocaine (LMX) 4 % cream Apply topically  2 (two) times daily as needed (USE EXTERNALLY for pain at the piercing site). (Patient not taking: Reported on 03/29/2019) 30 g 0  . povidone-iodine (BETADINE) 10 % external solution Apply 1 application topically as needed for wound care. 480 mL 0   No current facility-administered medications on file prior to visit.     Family Hx: The patient's family history includes CAD in his father, paternal grandfather, and paternal grandmother; Colon cancer in his paternal grandfather; Diabetes in his maternal grandfather, maternal grandmother, mother, and paternal uncle; Heart attack in his father; Heart disease in his father; Hyperlipidemia in his mother; Hypertension in his mother; Stroke in his father.  ROS:   Please see the history of present illness.    Review of Systems  Constitutional: Positive for malaise/fatigue.  Respiratory: Negative.   Cardiovascular: Negative.   Gastrointestinal: Negative.    Musculoskeletal: Negative.   Neurological: Negative.   Psychiatric/Behavioral: Negative.   All other systems reviewed and are negative.    Labs/Other Tests and Data Reviewed:    Recent Labs: 03/20/2019: ALT 17 03/21/2019: Magnesium 2.1 03/23/2019: BUN 12; Creatinine, Ser 2.16; Hemoglobin 13.6; Platelets 202; Potassium 3.6; Sodium 141   Recent Lipid Panel Lab Results  Component Value Date/Time   CHOL 264 (H) 03/20/2019 01:13 AM   CHOL 361 (H) 06/11/2014 12:32 PM   TRIG 162 (H) 03/20/2019 01:13 AM   TRIG 246 (H) 06/11/2014 12:32 PM   HDL 21 (L) 03/20/2019 01:13 AM   HDL 41 06/11/2014 12:32 PM   CHOLHDL 12.6 03/20/2019 01:13 AM   LDLCALC 211 (H) 03/20/2019 01:13 AM   LDLCALC 271 (H) 06/11/2014 12:32 PM    Wt Readings from Last 3 Encounters:  03/31/19 282 lb (127.9 kg)  03/29/19 281 lb (127.5 kg)  03/19/19 270 lb (122.5 kg)     Exam:    Vital Signs: Vital signs may also be detailed in the HPI BP 120/82 (BP Location: Left Arm, Patient Position: Sitting, Cuff Size: Large)   Pulse (!) 102   Ht 5\' 7"  (1.702 m)   Wt 282 lb (127.9 kg)   SpO2 98%   BMI 44.17 kg/m    Constitutional:  oriented to person, place, and time. No distress.  HENT:  Head: Grossly normal Eyes:  no discharge. No scleral icterus.  Neck: No JVD, no carotid bruits  Cardiovascular: Regular rate and rhythm, no murmurs appreciated Pulmonary/Chest: Clear to auscultation bilaterally, no wheezes or rails Abdominal: Soft.  no distension.  no tenderness.  Musculoskeletal: Normal range of motion Neurological:  normal muscle tone. Coordination normal. No atrophy Weakness [name] side including [name] leg, decreased strength on the [name] arm grip strength Skin: Skin warm and dry Psychiatric: normal affect, pleasant   ASSESSMENT & PLAN:    Dilated cardiomyopathy (HCC) EF has fluctuated 45 to 50% then dropped 25 to 30% in 2019 Ejection fraction back up to normal January 2021 We will continue beta-blocker,  spironolactone, Lasix off Entresto ACE ARB in the setting of renal failure Creatinine 2.16  systolic CHF, chronic (HCC) -  Euvolemic Normal ejection fraction January 2021  Essential hypertension Blood pressure is well controlled on today's visit. No changes made to the medications.  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

## 2019-03-31 NOTE — Patient Instructions (Addendum)
Medication Instructions:  Lipitor refills called in  Talk with neuro about lower dose aspirin 81 mg with plavix  If you need a refill on your cardiac medications before your next appointment, please call your pharmacy.    Lab work: No new labs needed   If you have labs (blood work) drawn today and your tests are completely normal, you will receive your results only by: Marland Kitchen MyChart Message (if you have MyChart) OR . A paper copy in the mail If you have any lab test that is abnormal or we need to change your treatment, we will call you to review the results.   Testing/Procedures: No new testing needed   Follow-Up: At [facility], you and your health needs are our priority.  As part of our continuing mission to provide you with exceptional heart care, we have created designated Provider Care Teams.  These Care Teams include your primary Cardiologist (physician) and Advanced Practice Providers (APPs -  Physician Assistants and Nurse Practitioners) who all work together to provide you with the care you need, when you need it.  . You will need a follow up appointment in 6 months   . Providers on your designated Care Team:   . Nicolasa Ducking, NP . Eula Listen, PA-C . Marisue Ivan, PA-C  Any Other Special Instructions Will Be Listed Below (If Applicable).  For educational health videos Log in to : [email or web address] Or : [email or web address], password : [email or web address]

## 2019-03-31 NOTE — Telephone Encounter (Signed)
I called patient and LVM regarding rescheduling 2/18 appointment due to inclement weather/office closure. NP is not offering MyChart visit. Requested patient call back to reschedule.

## 2019-04-01 ENCOUNTER — Ambulatory Visit: Payer: Medicaid Other

## 2019-04-01 ENCOUNTER — Inpatient Hospital Stay: Payer: Self-pay | Admitting: Adult Health

## 2019-04-01 ENCOUNTER — Ambulatory Visit: Payer: Medicaid Other | Admitting: Occupational Therapy

## 2019-04-01 ENCOUNTER — Ambulatory Visit: Payer: Medicaid Other | Admitting: Physical Therapy

## 2019-04-06 ENCOUNTER — Ambulatory Visit: Payer: Medicaid Other | Admitting: Physical Therapy

## 2019-04-07 ENCOUNTER — Telehealth: Payer: Self-pay

## 2019-04-07 NOTE — Telephone Encounter (Signed)
CMN signed and placed in [employer] folder. 

## 2019-04-08 ENCOUNTER — Ambulatory Visit: Payer: Medicaid Other | Attending: Family Medicine | Admitting: Physical Therapy

## 2019-04-08 ENCOUNTER — Other Ambulatory Visit: Payer: Self-pay

## 2019-04-08 ENCOUNTER — Ambulatory Visit: Payer: Medicaid Other

## 2019-04-08 ENCOUNTER — Encounter: Payer: Self-pay | Admitting: Physical Therapy

## 2019-04-08 ENCOUNTER — Other Ambulatory Visit: Payer: Self-pay | Admitting: *Deleted

## 2019-04-08 ENCOUNTER — Telehealth: Payer: Self-pay | Admitting: Family Medicine

## 2019-04-08 VITALS — BP 152/107 | HR 104

## 2019-04-08 DIAGNOSIS — M6281 Muscle weakness (generalized): Secondary | ICD-10-CM | POA: Insufficient documentation

## 2019-04-08 DIAGNOSIS — R262 Difficulty in walking, not elsewhere classified: Secondary | ICD-10-CM | POA: Diagnosis present

## 2019-04-08 DIAGNOSIS — R42 Dizziness and giddiness: Secondary | ICD-10-CM | POA: Diagnosis present

## 2019-04-08 MED ORDER — SPIRONOLACTONE 25 MG PO TABS: 25.0000 mg | ORAL_TABLET | Freq: Every day | ORAL | 3 refills | Status: DC

## 2019-04-08 NOTE — Telephone Encounter (Signed)
Thanks. If they can keep track of BP at rest as well, and notify us tomorrow if BP remains >140/90, then I would recommend double up carvedilol  He is on carvedilol 3.125mg  one pill twice a day.  If persistent elevated BP, he may increase dose to 6.25mg  twice a day (or take 2 pills per dose twice a day)  We can order more if this works for his BP and heart rate  Saralyn Pilar, DO [facility] 04/08/2019, 2:39 PM

## 2019-04-08 NOTE — Telephone Encounter (Signed)
Joel [name] notified she will keep track of his reading and call us back tomorrow.

## 2019-04-08 NOTE — Telephone Encounter (Addendum)
As per patient's spouse Shanda Bumps wanted to notify Dr.K about patient's B/P which is 152/107 mm/hg and pulse 104 and after 10 meter walk and five time sit and stand his pressure was 161/103 and pulse 105 and only symptoms he was having is light dizziness.

## 2019-04-09 NOTE — Telephone Encounter (Signed)
As per Shanda Bumps his B/P was 150/92 in the and she gave him 6.25 mg carvedilol.

## 2019-04-09 NOTE — Therapy (Signed)
[facility] MAIN [facility] SERVICES [address] [place], [place], [address] Phone: [phone]   Fax:  [phone]  Physical Therapy Evaluation  Patient Details  Name: Joel [name] MRN: [id number] Date of Birth: 05-15-1980 No data recorded  Encounter Date: 04/08/2019  PT End of Session - 04/08/19 1541    Visit Number  1    Number of Visits  16    Date for PT Re-Evaluation  06/03/19    Authorization Type  Medicaid    Authorization - Visit Number  1    Authorization - Number of Visits  16    Progress Note Due on Visit  3    PT Start Time  0105    PT Stop Time  0150    PT Time Calculation (min)  45 min    Equipment Utilized During Treatment  Gait belt    Activity Tolerance  Patient limited by fatigue;Other (comment)   Pt limited by high BP and dizziness   Behavior During Therapy  Flat affect       Past Medical History:  Diagnosis Date  . Anxiety   . Chronic diastolic CHF (congestive heart failure) (HCC)   . Diabetes mellitus without complication (HCC)   . Hyperlipidemia   . Hypertension   . Hypertensive heart disease   . Noncompliance with medications   . Obesity   . Renal disorder   . Stroke (HCC)   . Systolic dysfunction    a. TTE 06/2014: EF 45-50%, normal wall motion, mild MR, mild AI    Past Surgical History:  Procedure Laterality Date  . NO PAST SURGERIES      Vitals:   04/08/19 1526  BP: (!) 152/107  Pulse: (!) 104     Subjective Assessment - 04/09/19 0837    Subjective  Pt is a 39 y.o. male with hx of CVA on 03/19/2019 with resultant R sided hemiparesis in UE and LE.  Pt reports he has numbness in his R jaw and R side of his tongue, and has slurred speech and difficulty smiling since the CVA.  He reports that he is still weak in his R UE and LE, but that it is improving somewhat.  He has been going to the gym to try to do some strengthening per his report.  Pt also c/o hearing loss in the R ear since the CVA.  Pt is R  hand dominant.  Pt is to have OT and Speech evaluations next week per his report.    Pertinent History  Pt c/o difficulty with standing and ambulation due to muscle weakness and decreased endurance.  He also c/o persistent dizziness.  Pt states he has a collapsable cane, but no walker at home.  He has 4-5 steps with rails to exit/enter home, and lives in a 1-level home.  Pt was not working prior to the CVA, and states he was already on permanent disability for HTN, kidney failure, and CHF.  He reports a hx of "mini strokes" in his R eye and c/o a "static spot" in the R eye.  Pt has been able to ambulate shorter household distances, but fatigues with ambulating more than 7-10 meters.  Pt states that he is independent with all transfers and toileting, but that he gets "dizzy and disoriented" at times. Pt c/o HA's 2-3x/day.    How long can you stand comfortably?  1-2 minutes    How long can you walk comfortably?  1 minute  Patient Stated Goals  "To ride my motorcycle again, and get back to myself"        Objective Measurements:  Transfers and Bed mobility:  Sit<-> Supine CGA-minAx1 Sit<-> Stand Min Ax1 Strength and ROM:  Decreased AROM grossly throughout R UE and LE due to hemiparesis; R UE and LE 2+-3/5 grossly throughout; L UE and LE 4-4+/5 grossly throughout. Numbness and Tingling: Pt c/o numbness in R jaw and R side of tongue, has R sided facial hemiparesis and difficulty with speech; pt also reports numbness in R lateral distal LE and R lateral aspect of thigh. Coordination:  Pt has min-moderate limitations with Finger-Nose-Finger test using R UE upon evaluation; and moderate difficulty with Heel to [place] test on R LE. Gait:  Pt ambulates with rounded shoulders and forward trunk lean with FWW.  He has a shuffling gait pattern with poor heel strike, decreased step length, and decreased speed.  He has limitations in muscle endurance and fatigues quickly with ambulation (more than 5 meters).  Pt  exhibited moderate dyspnea and c/o LE muscle fatigue after ambulating 10 meters in clinic with FWW during evaluation. Balance: Sitting static Balance-Good; Sitting Dynamic Balance- Fair; Standing Static Balance Poor; Standing Dynamic Balance- Poor. Pain:  Pt c/o R sided jaw pain and R sided cervical pain that is neurogenic in nature and describes it as an extreme sharp, "stabbing" pain that is not constant, but when it occurs is rated as 10/10 on pain scale. Functional Balance Tests: 5x Sit to Stand = 27.32 seconds                             PT Education - 04/08/19 1538    Education Details  Did not initiate HEP at this visit due to pt's high blood pressure and c/o persistent dizziness.  Pt advised to stop exercising at the gym for now since it increases his dizziness.  Once is BP is more controlled and less dizziness, will initiate strengthening ex's.       PT Short Term Goals - 04/09/19 0815      PT SHORT TERM GOAL #1   Title  Pt will be educated in and independent with a home exercise program to improve his overall muscle strength and standing balance, thereby decreasing his fall risk.    Baseline  Pt currently has no exercise program.    Time  1    Period  Weeks    Status  New    Target Date  04/15/19      PT SHORT TERM GOAL #2   Title  Pt wil be educated in the proper use of appropriate assistive device for household and community ambulation to improve patient's safety.    Baseline  Pt currently at a high fall risk and not using an assistive device.    Time  1    Period  Weeks    Status  New    Target Date  04/15/19        PT Long Term Goals - 04/09/19 9892      PT LONG TERM GOAL #1   Title  Pt wil have improved overall B LE muscle strength by at least 1/2-1 muscle grade grossly throughout to allow him to transfer, stand, and ambulate more safely.    Baseline  Pt's R UE and LE mm. strength is 2+-3/5 grossly throughout, and L UE and LE is 4-4+/5 grossly  throughout.  Time  8    Period  Weeks    Status  New    Target Date  06/03/19      PT LONG TERM GOAL #2   Title  Pt will ambulate at least 75 meters or more, with improved posture, step length, gait speed with least restrictive assistive device.    Baseline  Pt currently ambulates 7-10 meters with FWW with rounded shoulders and forward trunk lean, with a shuffling pattern and decreased step length and gait speed.    Time  8    Period  Weeks    Status  New    Target Date  06/03/19      PT LONG TERM GOAL #3   Title  Pt will have an improved 5 Times Sit to Stand score to at least 17 seconds or better, indicating decreased fall risk.    Baseline  Pt performed 5x Sit to Stand in 27.32 seconds at initial evaluation, indicating a high fall risk.    Time  8    Period  Weeks    Status  New    Target Date  06/03/19      PT LONG TERM GOAL #4   Title  Pt will report being able to ambulate longer community distances for shopping and appointments due to improved dynamic balance and overall muscle strength.    Baseline  Pt reports that he currently cannot ambulate for more than 30 seconds- 1 minute (or greater than 10 meters) without being limited by weakness, fatigue, and dizziness.    Time  8    Period  Weeks    Status  New    Target Date  06/03/19             Plan - 04/08/19 1542    Clinical Impression Statement  Joel [name] has R sided weakness in his UE and LE due to CVA, and has difficulty with prolonged standing activities and prolonged ambulation.  He has multiple comorbidities involving heart and kidney disease.  He was limited during his PT evaluation by high blood pressure and complaints of dizziness after ambulating 10 meters with FWW and performing 5x Sit to Stand test.  Unable to perform other functional assessment tools due to high BP (152/107, pulse 104) after the previously mentioned tests.  Pt states his BP usually is around 117/90.  Will plan to initiate home exercises and  do further assessment (possibly Berg) when BP and dizziness is better under control.  Pt not appropriate for 6 minute walk test at this time.  He had severe dyspnea after 5x Sit to Stand test at today's evaluation.  He should respond well to skilled PT intervention to improve his ability to ambulate and stand more safely and for longer periods, to have improved muscle strength and balance with all standing activities and ambulation, thereby decreasing his fall risk.    Personal Factors and Comorbidities  Comorbidity 3+    Stability/Clinical Decision Making  Evolving/Moderate complexity    Clinical Decision Making  Moderate    Rehab Potential  Good    PT Frequency  2x / week    PT Duration  8 weeks    PT Treatment/Interventions  Gait training;Stair training;Functional mobility training;Therapeutic activities;Therapeutic exercise;Balance training;Neuromuscular re-education;Patient/family education;Manual techniques    PT Next Visit Plan  Berg if appropriate; initiate HEP    PT Home Exercise Plan  Hold until high BP and dizzizness addressed by pt's physician    Consulted and Agree with Plan  of Care  Patient       Patient will benefit from skilled therapeutic intervention in order to improve the following deficits and impairments:  Abnormal gait, Decreased activity tolerance, Decreased balance, Decreased coordination, Decreased endurance, Decreased mobility, Decreased range of motion, Decreased safety awareness, Decreased strength, Difficulty walking  Visit Diagnosis: Muscle weakness (generalized)  Difficulty in walking, not elsewhere classified  Dizziness and giddiness     Problem List Patient Active Problem List   Diagnosis Date Noted  . Acute CVA (cerebrovascular accident) ([place]) 03/19/2019  . 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 Depression, major, recurrent, moderate ([place]) 12/19/2017  . Fatigue 04/07/2017  . Dilated cardiomyopathy ([place])  02/23/2017  . Hypertensive heart disease with heart failure ([place]) 02/23/2017  . Essential hypertension 02/21/2017  . Uncontrolled type 2 diabetes mellitus with nephropathy ([place]) 02/21/2017  . H/O medication noncompliance 02/12/2017  . Chronic kidney disease (CKD), stage III (moderate) 02/12/2017  . Systolic CHF, chronic ([place]) 10/11/2014    Monaye Blackie, MPT 04/09/2019, 8:40 AM  [place] MAIN [facility] SERVICES [address] [place], [place], [address] Phone: [phone]   Fax:  [phone]  Name: Joel [name] MRN: [id number] Date of Birth: Jul 02, 1980

## 2019-04-12 ENCOUNTER — Other Ambulatory Visit: Payer: Self-pay

## 2019-04-12 ENCOUNTER — Encounter: Payer: Self-pay | Admitting: Occupational Therapy

## 2019-04-12 ENCOUNTER — Ambulatory Visit: Payer: Medicaid Other | Attending: Family Medicine | Admitting: Occupational Therapy

## 2019-04-12 ENCOUNTER — Ambulatory Visit: Payer: Medicaid Other

## 2019-04-12 ENCOUNTER — Telehealth: Payer: Self-pay | Admitting: Family Medicine

## 2019-04-12 ENCOUNTER — Telehealth: Payer: Self-pay

## 2019-04-12 DIAGNOSIS — M6281 Muscle weakness (generalized): Secondary | ICD-10-CM | POA: Insufficient documentation

## 2019-04-12 DIAGNOSIS — R262 Difficulty in walking, not elsewhere classified: Secondary | ICD-10-CM | POA: Diagnosis present

## 2019-04-12 DIAGNOSIS — R278 Other lack of coordination: Secondary | ICD-10-CM | POA: Insufficient documentation

## 2019-04-12 DIAGNOSIS — R42 Dizziness and giddiness: Secondary | ICD-10-CM | POA: Insufficient documentation

## 2019-04-12 NOTE — Telephone Encounter (Signed)
Acknowledged.  I have also responded to the MyChart Messages with same response.  Saralyn Pilar, DO [facility] 04/12/2019, 9:12 AM

## 2019-04-12 NOTE — Addendum Note (Signed)
Addended by: Avon Gully on: 04/12/2019 06:02 PM   Modules accepted: Orders

## 2019-04-12 NOTE — Telephone Encounter (Signed)
Calling Joel [name] back regarding the My chart messages, he is feeling much better and no headache today, blood pressure reading is improved, it was Just FYI for weekend that out of 2 days 1st day his pressure was high and next day was getting better, patient has apt with neurologist on 03/10 and also notified her that any urgent message can't be send through My chart and specially on weekend when we can't read any messages, if that happens on weekend, please seek medical attention if needed with his past history, also informed to reach out to the neurologist and let them aware about his reading. She was offered to do virtual apt with Dr.K if she needed to talk about blood pressure reading but she is willing to wait until his apt with neurologist.

## 2019-04-12 NOTE — Telephone Encounter (Signed)
Patient informed. 

## 2019-04-12 NOTE — Telephone Encounter (Signed)
Patient's wife called now about same question on mychart I already responded, here is copy of my response  In response to asking if she can increase to Carvedilol 25mg  BID like he was taking prior to hospital  Yes, I am okay to increase the Carvedilol back to similar dose that you were taking prior to hospital, if you want to go perhaps to either HALF of the 25mg  tablet twice a day, so it is 12.5mg  twice a day next ,or to whole 25mg  tablet twice a day, whichever is needed to help control BP.  Also please keep in mind - this is a Cardiac medication. Your heart failure cardiologist team has been managing this medicine and the others for the heart. If you have major questions on these meds, feel free to reach out to them as well, as they should be involved in these decisions too.  , DO [facility] 04/12/2019, 12:18 PM

## 2019-04-12 NOTE — Therapy (Signed)
[facility] MAIN [facility] SERVICES [address] [place], [place], [address] Phone: [phone]   Fax:  [phone]  Occupational Therapy Evaluation  Patient Details  Name: Joel [name] MRN: [id number] Date of Birth: 1980/07/11 Referring Provider (OT): Dr. Althea Charon   Encounter Date: 04/12/2019  OT End of Session - 04/12/19 1210    Visit Number  1    Number of Visits  12    Date for OT Re-Evaluation  07/05/19    Authorization Type  Progress reporting period starting 04/12/2019    OT Start Time  1100    OT Stop Time  1200    OT Time Calculation (min)  60 min    Activity Tolerance  Patient tolerated treatment well    Behavior During Therapy  Flat affect       Past Medical History:  Diagnosis Date  . Anxiety   . Chronic diastolic CHF (congestive heart failure) (HCC)   . Diabetes mellitus without complication (HCC)   . Hyperlipidemia   . Hypertension   . Hypertensive heart disease   . Noncompliance with medications   . Obesity   . Renal disorder   . Stroke (HCC)   . Systolic dysfunction    a. TTE 06/2014: EF 45-50%, normal wall motion, mild MR, mild AI    Past Surgical History:  Procedure Laterality Date  . NO PAST SURGERIES      There were no vitals filed for this visit.  Subjective Assessment - 04/12/19 1218    Subjective   Pt. reports that his wife has been in contact with his physician regarding medication changes, and elevated BP.    Patient is accompanied by:  Family member    Pertinent History  Pt. is a 39 y.o. 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 Pt. 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 PMHx includes: Atherosclerosis, anxiety, CHF, DM, Obesity, Stroke, EF, 45-50%, sleep apnea, HTN, CKD3, and esstenial HTN. Pt. Resides with his wife in a single story home. Pt. enjoys riding motorcycles.    Patient Stated  Goals  To regain independence    Currently in Pain?  No/denies    Pain Score  0-No pain        [facility] OT Assessment - 04/12/19 1108      Assessment   Medical Diagnosis  CVA    Referring Provider (OT)  Dr. Althea Charon    Onset Date/Surgical Date  03/19/19    Hand Dominance  Right      Balance Screen   Has the patient fallen in the past 6 months  No    Has the patient had a decrease in activity level because of a fear of falling?   No    Is the patient reluctant to leave their home because of a fear of falling?   No      Home  Environment   Family/patient expects to be discharged to:  Private residence    Living Arrangements  Spouse/significant other    Available Help at Discharge  Family    Type of Home  Mobile home    Home Access  Stairs    Home Layout  One level    Bathroom Risk manager;Door    [employer] - single point;Shower seat - built in;Hand held shower head  Lives With  Spouse      Prior Function   Level of Independence  Independent    Vocation  On disability    Leisure  Restaurant manager, fast food     ADL   Eating/Feeding  Min guard   cutting food.   Grooming  Minimal assistance   incoordination with brushing teeth.   Upper Body Bathing  Independent    Lower Body Bathing  Independent    Upper Body Dressing  Independent    Lower Body Dressing  Minimal assistance   incoordination with tying shoes   Toilet Transfer  Independent    Toileting - Clothing Manipulation  Independent    Tub/Shower Transfer  Supervision/safety      IADL   Prior Level of Function Shopping  --   Independent   Shopping  Needs to be accompanied on any shopping trip    Prior Level of Function Light Housekeeping  Independent    Light Housekeeping  Does not participate in any housekeeping tasks    Prior Level of Function Meal Prep  Independent    Meal Prep  --   Has not performed   Prior Level of Function Community Mobility   Independent   Independent   Community Mobility  Relies on family or friends for transportation    Prior Level of Function Medication Managment  Independent    Medication Management  Is not capable of dispensing or managing own medication    Prior Level of Function Financial Management  --   Wife previously completed     Mobility   Mobility Status  Independent      Written Expression   Dominant Hand  Right    Handwriting  50% legible      Vision - History   Baseline Vision  Other (comment)   history of mini strokes with a static spot in the right eye.   Patient Visual Report  --   History of mini strokes with a static spot in right eye.     Vision Assessment   Tracking/Visual Pursuits  --   Pt. reports difficulty with tracking to far right/superiorly     Activity Tolerance   Activity Tolerance  Tolerates < 10 min activity with changes in vital signs      Cognition   Overall Cognitive Status  Impaired/Different from baseline    Memory  Impaired      Sensation   Light Touch  Appears Intact    Stereognosis  Appears Intact      Coordination   Gross Motor Movements are Fluid and Coordinated  No    Fine Motor Movements are Fluid and Coordinated  No    Right 9 Hole Peg Test  31 sec.    Left 9 Hole Peg Test  21sec.      AROM   Overall AROM Comments  RUE WFL      Strength   Overall Strength Comments  RUE strength: shoulder flexion, abduction 4-/5, elbow flexion, extension 4+/5, fwrist extension 4/5,. LUE: 5/5      Hand Function   Right Hand Grip (lbs)  13#    Right Hand Lateral Pinch  10 lbs    Right Hand 3 Point Pinch  7 lbs    Left Hand Grip (lbs)  50#    Left Hand Lateral Pinch  18 lbs    Left 3 point pinch  14 lbs  OT Education - 04/12/19 1209    Education Details  Green theraputty provided with 2 exercises. WIll need a HEP with additional exercises.    Person(s) Educated  Patient    Methods  Explanation    Comprehension   Verbalized understanding;Returned demonstration;Need further instruction          OT Long Term Goals - 04/12/19 1738      OT LONG TERM GOAL #1   Title  Pt. will improve RUE strength 2 mm grades in order to be able to carry groceries.    Baseline  Eval: Pt is unable to lift, or carry grocery bags secondary to limited BUE strength    Time  12    Period  Weeks    Status  New    Target Date  07/05/19      OT LONG TERM GOAL #2   Title  Pt. will improve right grip strength by 10#  to be able to turn a throttle on his motorcycle handle    Baseline  Eval: Pt. is unable to turn a throttle on the motocycle    Time  12    Period  Weeks    Status  New    Target Date  07/05/19      OT LONG TERM GOAL #3   Title  Pt. will  improve right lateral pinch strength  by 3# to the able to cut meat.    Baseline  Eval: Pt. has difficulty cutting meat.    Time  12    Period  Weeks    Status  New    Target Date  07/05/19      OT LONG TERM GOAL #4   Title  Pt. will be independently, and efficiently brush his teeth using his right hand.    Baseline  Eval: Pt. has difficulty brushing his teeth.    Time  12    Period  Weeks    Status  New    Target Date  07/05/19      OT LONG TERM GOAL #5   Title  Pt. will improve right hand [facility] skills by 3 sec. of speed to be able to tie his shoes.    Baseline  Eval: Pt. has difficulty tying shoes.    Time  12    Period  Weeks    Status  New    Target Date  07/05/19      Long Term Additional Goals   Additional Long Term Goals  Yes      OT LONG TERM GOAL #6   Title  Pt. will write on sentence with 100% legibility in preparation for completing paperwork/documents.    Baseline  Eval: 50% legibility for name only.    Time  12    Period  Weeks    Status  New    Target Date  07/05/19      OT LONG TERM GOAL #7   Title  Pt. will demonstrate visual compensatory strategies 100% of the time when navigating within his environment during ADLs, and IADL tasks.     Baseline  Eval: Pt. has difficulty tracking/scanning to far right.    Time  12    Period  Weeks    Status  New    Target Date  07/05/19            Plan - 04/12/19 1224    Clinical Impression Statement  Pt. is   a 39 y.o. 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 Pt. presents with impaired RUE strength, grip strength, pinch strength, motor control, and [facility] skills which limit his ability to perform self-grooming tasks, writing legibility, tying shoes, caryying groceries, and turning a throttle on his motorcycle. Pt. presents with a history of visual changes from mini strokes in his right eye limiting tracking, and scanning to the far right, and right upper quadrant. Pt.'s BP 156/106 with HR 104. Pt.'s wife has been in contact with the Physician's office regarding medication changes, and elevated BP. Pt. will benefit from OT services will beneift from OT skilled services to work om improving RUE strength, grip strength, motor control, and hand function skills in order to work towards improving functional hand use during ADLs/IADLs, and to maximize independence.    OT Occupational Profile and History  Detailed Assessment- Review of Records and additional review of physical, cognitive, psychosocial history related to current functional performance    Occupational performance deficits (Please refer to evaluation for details):  ADL's;IADL's    Rehab Potential  Good    Clinical Decision Making  Several treatment options, min-mod task modification necessary    Comorbidities Affecting Occupational Performance:  May have comorbidities impacting occupational performance    Modification or Assistance to Complete Evaluation   Min-Moderate modification of tasks or assist with assess necessary to complete eval    OT Frequency  1x / week    OT Duration  12 weeks    OT  Treatment/Interventions  Self-care/ADL training;Therapeutic exercise;DME and/or AE instruction;Energy conservation;Neuromuscular education;Cognitive remediation/compensation;Patient/family education;Visual/perceptual remediation/compensation;Therapeutic activities    Consulted and Agree with Plan of Care  Patient       Patient will benefit from skilled therapeutic intervention in order to improve the following deficits and impairments:           Visit Diagnosis: Muscle weakness (generalized)  Other lack of coordination    Problem List Patient Active Problem List   Diagnosis Date Noted  . Acute CVA (cerebrovascular accident) ([place]) 03/19/2019  . 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 Depression, major, recurrent, moderate ([place]) 12/19/2017  . Fatigue 04/07/2017  . Dilated cardiomyopathy ([place]) 02/23/2017  . Hypertensive heart disease with heart failure ([place]) 02/23/2017  . Essential hypertension 02/21/2017  . Uncontrolled type 2 diabetes mellitus with nephropathy ([place]) 02/21/2017  . H/O medication noncompliance 02/12/2017  . Chronic kidney disease (CKD), stage III (moderate) 02/12/2017  . Systolic CHF, chronic ([place]) 10/11/2014    Harrel Carina, MS, OTR/L 04/12/2019, 5:57 PM  [place] MAIN [facility] SERVICES [address] [place], [place], [address] Phone: [phone]   Fax:  [phone]  Name: Joel [name] MRN: [id number] Date of Birth: 04-28-1980

## 2019-04-12 NOTE — Telephone Encounter (Signed)
See last mychart message response  Saralyn Pilar, DO [facility] 04/12/2019, 9:09 AM

## 2019-04-14 ENCOUNTER — Encounter: Payer: Medicaid Other | Admitting: Occupational Therapy

## 2019-04-14 ENCOUNTER — Ambulatory Visit: Payer: Medicaid Other

## 2019-04-14 ENCOUNTER — Encounter: Payer: Self-pay | Admitting: Family Medicine

## 2019-04-14 ENCOUNTER — Other Ambulatory Visit: Payer: Self-pay

## 2019-04-14 DIAGNOSIS — R278 Other lack of coordination: Secondary | ICD-10-CM

## 2019-04-14 DIAGNOSIS — R42 Dizziness and giddiness: Secondary | ICD-10-CM

## 2019-04-14 DIAGNOSIS — R262 Difficulty in walking, not elsewhere classified: Secondary | ICD-10-CM

## 2019-04-14 DIAGNOSIS — I951 Orthostatic hypotension: Secondary | ICD-10-CM | POA: Insufficient documentation

## 2019-04-14 DIAGNOSIS — M6281 Muscle weakness (generalized): Secondary | ICD-10-CM

## 2019-04-14 NOTE — Therapy (Signed)
[place] MAIN [facility] SERVICES [address] [place], [place], [address] Phone: [phone]   Fax:  [phone]  Physical Therapy Treatment  Patient Details  Name: Joel [name] MRN: [id number] Date of Birth: 01/22/1981 No data recorded  Encounter Date: 04/14/2019  PT End of Session - 04/14/19 1119    Visit Number  2    Number of Visits  16    Date for PT Re-Evaluation  06/03/19    Authorization Type  Medicaid    Authorization - Visit Number  1    Authorization - Number of Visits  16    Progress Note Due on Visit  3    PT Start Time  1055    PT Stop Time  1135    PT Time Calculation (min)  40 min    Equipment Utilized During Treatment  Gait belt    Activity Tolerance  Patient limited by fatigue;Other (comment)    Behavior During Therapy  WFL for tasks assessed/performed       Past Medical History:  Diagnosis Date  . Anxiety   . Chronic diastolic CHF (congestive heart failure) ([place])   . Diabetes mellitus without complication ([place])   . Hyperlipidemia   . Hypertension   . Hypertensive heart disease   . Noncompliance with medications   . Obesity   . Renal disorder   . Stroke ([place])   . Systolic dysfunction    a. TTE 06/2014: EF 45-50%, normal wall motion, mild MR, mild AI    Past Surgical History:  Procedure Laterality Date  . NO PAST SURGERIES      There were no vitals filed for this visit.  Subjective Assessment - 04/14/19 1105    Subjective  Still dizzy daily without nystagmus or visual changes, more 'underwater feeling' similar most akin to lightheaded ness. Has since stopped going to the gym. Has a neurology OP FU scheduled 3/10.    Pertinent History  Pt c/o difficulty with standing and ambulation due to muscle weakness and decreased endurance.  He also c/o persistent dizziness.  Pt states he has a collapsable cane, but no walker at home.  He has 4-5 steps with rails to exit/enter home, and lives in a 1-level home.  Pt was not  working prior to the CVA, and states he was already on permanent disability for HTN, kidney failure, and CHF.  He reports a hx of "mini strokes" in his R eye and c/o a "static spot" in the R eye.  Pt has been able to ambulate shorter household distances, but fatigues with ambulating more than 7-10 meters.  Pt states that he is independent with all transfers and toileting, but that he gets "dizzy and disoriented" at times. Pt c/o HA's 2-3x/day.    Currently in Pain?  No/denies         [facility] PT Assessment - 04/14/19 0001      ROM / Strength   AROM / PROM / Strength  Strength      Strength   Strength Assessment Site  Hip;Knee;Ankle    Right/Left Hip  Right;Left    Right Hip Flexion  4/5   range limited, max effort, oscillatory failure   Right Hip Extension  --   deferred to next visit; rec 1RM on leg press   Right Hip External Rotation   4+/5   range limited, max effort, oscillatory failure   Right Hip Internal Rotation  4/5   range limited, max effort, oscillatory failure  Right Hip ABduction  --   horizontal ABDCT: 4+/5 (range limited, max effort)   Left Hip Flexion  5/5    Left Hip External Rotation  5/5    Left Hip Internal Rotation  5/5    Left Hip ABduction  --   horizontal ABDCT 5/5   Right/Left Knee  Right;Left    Right Knee Flexion  4/5   range limited, max effort, oscillatory failure   Right Knee Extension  4+/5   range limited, max effort, oscillatory failure   Left Knee Flexion  5/5    Left Knee Extension  5/5    Right/Left Ankle  Right;Left    Right Ankle Dorsiflexion  5/5   range limited, activation delayed   Right Ankle Plantar Flexion  5/5    Left Ankle Dorsiflexion  5/5    Left Ankle Plantar Flexion  5/5      INTERVENTION THIS DATE:  Vtials collection at beginning of session -Seated vitals: 152/106 mmHg 107BPM (dizzy prior to arrival, in waiting area) -Standing vitals (1 minute): 114/75 mmHg 120BPM (no symptoms change) -Standing at 3 minutes: 123/78  mmHg, 124bpm (elects to sit immediately after d/t worsening weakness/fatigue)  -Seated x 1 minute 132/96 108bpm (symptoms improving) *additional probing regarding fluid intake, urine output, pedal edema, CHF history, medication list, prior medical recommendations from cardiology, detailed description of symptomology and response to activity. **explanation of orthostatic hypotension and how it relates to symptoms, recommendations for improved fluid intake, closer monitoring of BP, fluids, and pedal edema, as they pertain to symptomology   -5 minutes on [employer] recumbent stepper, level 2: 122/64mmHg, 117bpm, 100spO2 (SOB as per baseline)  dizzy but better than dizziness levels over the past 2 weeks -after 10 minutes Octane stepper Level 3: 123/88 mmHg, 116bpm (dizzy, but better than upon arrival) *Pt cleared for similar activity at gym, up to 10 minutes recumbent stepping, moderate effort, must be symptom free  -Seated MMT:  -Review of pt homework (as follows):  Joel [name]'s PT homework April 14, 2018  -Measure volume of your water container to report back to physical therapist  -drink up to 3 quarts daily (96oz) -Monitor feet/legs daily for swelling- call doctor if getting worse  On days when dizziness symptoms are worst: -check BP in standing -assure plenty of water intake       PT Education - 04/14/19 1206    Education Details  Guideline for resuming recumbent stepper activity at [employer] c close symptoms monitoring; daily water intake goals, daily BP monitoring PRN, explanation of orthostatic hypotension and positional management    Person(s) Educated  Patient    Methods  Explanation;Demonstration    Comprehension  Verbalized understanding;Need further instruction       PT Short Term Goals - 04/09/19 0815      PT SHORT TERM GOAL #1   Title  Pt will be educated in and independent with a home exercise program to improve his overall muscle strength and standing  balance, thereby decreasing his fall risk.    Baseline  Pt currently has no exercise program.    Time  1    Period  Weeks    Status  New    Target Date  04/15/19      PT SHORT TERM GOAL #2   Title  Pt wil be educated in the proper use of appropriate assistive device for household and community ambulation to improve patient's safety.    Baseline  Pt currently at a high fall  risk and not using an assistive device.    Time  1    Period  Weeks    Status  New    Target Date  04/15/19        PT Long Term Goals - 04/09/19 5035      PT LONG TERM GOAL #1   Title  Pt wil have improved overall B LE muscle strength by at least 1/2-1 muscle grade grossly throughout to allow him to transfer, stand, and ambulate more safely.    Baseline  Pt's R UE and LE mm. strength is 2+-3/5 grossly throughout, and L UE and LE is 4-4+/5 grossly throughout.    Time  8    Period  Weeks    Status  New    Target Date  06/03/19      PT LONG TERM GOAL #2   Title  Pt will ambulate at least 75 meters or more, with improved posture, step length, gait speed with least restrictive assistive device.    Baseline  Pt currently ambulates 7-10 meters with FWW with rounded shoulders and forward trunk lean, with a shuffling pattern and decreased step length and gait speed.    Time  8    Period  Weeks    Status  New    Target Date  06/03/19      PT LONG TERM GOAL #3   Title  Pt will have an improved 5 Times Sit to Stand score to at least 17 seconds or better, indicating decreased fall risk.    Baseline  Pt performed 5x Sit to Stand in 27.32 seconds at initial evaluation, indicating a high fall risk.    Time  8    Period  Weeks    Status  New    Target Date  06/03/19      PT LONG TERM GOAL #4   Title  Pt will report being able to ambulate longer community distances for shopping and appointments due to improved dynamic balance and overall muscle strength.    Baseline  Pt reports that he currently cannot ambulate for more  than 30 seconds- 1 minute (or greater than 10 meters) without being limited by weakness, fatigue, and dizziness.    Time  8    Period  Weeks    Status  New    Target Date  06/03/19            Plan - 04/14/19 1218    Clinical Impression Statement  Pt arrives to session, still very limited by dizziness complaint since prior visit. Pt has DC gym activity. Orthostatic vitals established, noting gradual progressive SBP drop >39mmHg during 3 minutes standing, pt has worsening of sleepiness, weakness, dizziness, all of which improve with return to sitting >60sec. Pt trialed on recumbent stepper to assess symptoms response to moderate aerobic activity, good normailzation of BP at 5 and 10 minutes (120s/80s), dizziness only minimal during and after, pt reporting symptoms to be at their nadir of the past fortnight. Pt cleared for resuming moderate cardio at gym. Pt asked to monitor fluid intake, pedal edema, and BP. Will communicate finidings to neurology prior to OP visit next week.    Rehab Potential  Good    PT Frequency  2x / week    PT Duration  8 weeks    PT Treatment/Interventions  Gait training;Stair training;Functional mobility training;Therapeutic activities;Therapeutic exercise;Balance training;Neuromuscular re-education;Patient/family education;Manual techniques    PT Next Visit Plan  [place] if appropriate; initiate HEP  PT Home Exercise Plan  See visit 2 note.    Consulted and Agree with Plan of Care  Patient       Patient will benefit from skilled therapeutic intervention in order to improve the following deficits and impairments:  Abnormal gait, Decreased activity tolerance, Decreased balance, Decreased coordination, Decreased endurance, Decreased mobility, Decreased range of motion, Decreased safety awareness, Decreased strength, Difficulty walking  Visit Diagnosis: Muscle weakness (generalized)  Other lack of coordination  Difficulty in walking, not elsewhere  classified  Dizziness and giddiness     Problem List Patient Active Problem List   Diagnosis Date Noted  . Acute CVA (cerebrovascular accident) (HCC) 03/19/2019  . 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 Depression, major, recurrent, moderate (HCC) 12/19/2017  . Fatigue 04/07/2017  . Dilated cardiomyopathy (HCC) 02/23/2017  . Hypertensive heart disease with heart failure (HCC) 02/23/2017  . Essential hypertension 02/21/2017  . 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 H/O medication noncompliance 02/12/2017  . Chronic kidney disease (CKD), stage III (moderate) 02/12/2017  . Systolic CHF, chronic (HCC) 10/11/2014   12:27 PM, 04/14/19 Rosamaria Lints, PT, DPT Physical Therapist - [facility]  Outpatient Physical Therapy- Main Campus [phone]     Rosamaria Lints 04/14/2019, 12:07 PM  [place] [facility] MAIN [facility] SERVICES [address] [place], [place], [address] Phone: [phone]   Fax:  [phone]  Name: Joel [name] MRN: [id number] Date of Birth: [birth year]

## 2019-04-15 ENCOUNTER — Ambulatory Visit: Payer: Medicaid Other

## 2019-04-16 ENCOUNTER — Telehealth: Payer: Self-pay

## 2019-04-16 NOTE — Telephone Encounter (Signed)
CMN SIGNED AND PLACED IN [place] FOLDER. °

## 2019-04-18 NOTE — Telephone Encounter (Signed)
Would recommend he call us with some blood pressure measurements over the next week or 2 and the dose of the carvedilol he is taking Okay to increase back to 25 twice daily if that is what it takes but we need to see numbers Would suggest to try to take some blood pressure measurements not just when PT is there but at other times by himself as well after he takes his medications late morning and afternoon

## 2019-04-19 ENCOUNTER — Encounter: Payer: Medicaid Other | Admitting: Occupational Therapy

## 2019-04-19 ENCOUNTER — Ambulatory Visit: Payer: Medicaid Other | Admitting: Physical Therapy

## 2019-04-19 ENCOUNTER — Ambulatory Visit: Payer: Medicaid Other

## 2019-04-21 ENCOUNTER — Inpatient Hospital Stay: Payer: Medicaid Other | Admitting: Adult Health

## 2019-04-21 ENCOUNTER — Other Ambulatory Visit: Payer: Self-pay

## 2019-04-21 ENCOUNTER — Ambulatory Visit: Payer: Medicaid Other | Admitting: Occupational Therapy

## 2019-04-21 ENCOUNTER — Encounter: Payer: Self-pay | Admitting: Adult Health

## 2019-04-21 ENCOUNTER — Ambulatory Visit: Payer: Medicaid Other

## 2019-04-21 ENCOUNTER — Other Ambulatory Visit: Payer: Self-pay | Admitting: *Deleted

## 2019-04-21 ENCOUNTER — Ambulatory Visit: Payer: Medicaid Other | Admitting: Adult Health

## 2019-04-21 ENCOUNTER — Encounter: Payer: Self-pay | Admitting: Occupational Therapy

## 2019-04-21 ENCOUNTER — Ambulatory Visit: Payer: Medicaid Other | Admitting: Physical Therapy

## 2019-04-21 ENCOUNTER — Encounter: Payer: Self-pay | Admitting: Physical Therapy

## 2019-04-21 VITALS — BP 109/76 | HR 105 | Temp 98.0°F | Ht 67.0 in | Wt 266.4 lb

## 2019-04-21 VITALS — BP 132/96

## 2019-04-21 DIAGNOSIS — E1165 Type 2 diabetes mellitus with hyperglycemia: Secondary | ICD-10-CM

## 2019-04-21 DIAGNOSIS — H8190 Unspecified disorder of vestibular function, unspecified ear: Secondary | ICD-10-CM | POA: Diagnosis not present

## 2019-04-21 DIAGNOSIS — I10 Essential (primary) hypertension: Secondary | ICD-10-CM | POA: Diagnosis not present

## 2019-04-21 DIAGNOSIS — I663 Occlusion and stenosis of cerebellar arteries: Secondary | ICD-10-CM

## 2019-04-21 DIAGNOSIS — R278 Other lack of coordination: Secondary | ICD-10-CM

## 2019-04-21 DIAGNOSIS — M6281 Muscle weakness (generalized): Secondary | ICD-10-CM

## 2019-04-21 DIAGNOSIS — F4322 Adjustment disorder with anxiety: Secondary | ICD-10-CM

## 2019-04-21 DIAGNOSIS — E1121 Type 2 diabetes mellitus with diabetic nephropathy: Secondary | ICD-10-CM

## 2019-04-21 DIAGNOSIS — R262 Difficulty in walking, not elsewhere classified: Secondary | ICD-10-CM

## 2019-04-21 DIAGNOSIS — R42 Dizziness and giddiness: Secondary | ICD-10-CM

## 2019-04-21 DIAGNOSIS — E785 Hyperlipidemia, unspecified: Secondary | ICD-10-CM | POA: Diagnosis not present

## 2019-04-21 DIAGNOSIS — I639 Cerebral infarction, unspecified: Secondary | ICD-10-CM | POA: Diagnosis not present

## 2019-04-21 DIAGNOSIS — IMO0002 Reserved for concepts with insufficient information to code with codable children: Secondary | ICD-10-CM

## 2019-04-21 MED ORDER — CARVEDILOL 6.25 MG PO TABS: 6.2500 mg | ORAL_TABLET | Freq: Two times a day (BID) | ORAL | 3 refills | Status: DC

## 2019-04-21 MED ORDER — ALPRAZOLAM 0.25 MG PO TABS: 0.2500 mg | ORAL_TABLET | Freq: Two times a day (BID) | ORAL | 0 refills | Status: DC | PRN

## 2019-04-21 MED ORDER — MECLIZINE HCL 12.5 MG PO TABS: 12.5000 mg | ORAL_TABLET | Freq: Three times a day (TID) | ORAL | 2 refills | Status: DC | PRN

## 2019-04-21 NOTE — Progress Notes (Unsigned)
car

## 2019-04-21 NOTE — Progress Notes (Signed)
[facility] [address] [place]. [place] [address] [phone]       HOSPITAL FOLLOW UP NOTE  [name] Date of Birth:  Feb 27, 1980 Medical Record Number:  [id number]   Reason for Referral:  hospital stroke follow up    CHIEF COMPLAINT:  Chief Complaint  Patient presents with  . Hospitalization Follow-up    Wife present. Treatment room. Patient's wife mentioned that he has some buzzing in right ear. He is constantly dizzy and nauseous.     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  History obtained from patient, wife and chart review. Reviewed all radiology images and labs personally.  [name] is a 39 y.o. male with history of diabetes, CKD stage IIIb, hypertension, hyperlipidemia, EF 45-50%, CHF and hx of medical non compliance,  who presented on 03/19/2019 with dizziness, gait instability, loss of hearing R ear, facial numbness, nausea and vomiting. He did not receive IV t-PA due to late presentation (>4.5 hours from time of onset).  Evaluated by stroke team and Dr. 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  MRA head also showed severe stenosis versus artifact in left PCA distal P2/proximal P3 segment, intracranial arthrosclerosis involving anterior and posterior circulation including distal right vertebral artery, basilar artery, supraclinoid right ICA and bilateral MCA branches.  History of HTN and recommend long-term BP goal normotensive range.  LDL 211 and recommended continuation of atorvastatin 80 mg daily with medication compliance education provided.  DM type II controlled A1c 7.0.  History of cardiomyopathy/CHF with current admission EF 55 to 60% (02/2017 EF 25 to 30%) and recommended continued follow-up with  cardiology.  Other stroke risk factors include prior EtOH use, morbid obesity, family history of stroke, OSA on CPAP and possible chronic stroke on imaging.  Recommended DAPT for 3 months then Plavix alone as previously on aspirin given intracranial stenosis.  Evaluated by therapies who recommended CIR but patient declined preferring to be discharged home.  Joel [name] is a 39 year old male who is being seen today for stroke follow-up accompanied by his wife.  Residual stroke deficits mild right-sided weakness, dizziness, vertigo, nausea and right ear tinnitus with decreased hearing.  Currently working with physical therapy with improvement of his right side but endorses only minimal improvement of vestibular symptoms as well as symptoms waxing/waning.  Previously complaining of headaches which have since improved consisting of generalized pressure radiating down into his neck.  Underlying history of depression and does endorse worsening anxiety since his stroke.  Not currently on any medication therapy.  Continues on aspirin 325 mg daily and Plavix without bleeding or bruising.  Continues on atorvastatin without myalgias.  Blood pressure today 109/76 and does monitor at home typically 150/110-120.  Prior difficulty with blood pressure which has since improved.  Wife is not sure the accuracy of home cuff as they are using an older cuff.  Ongoing use of CPAP for OSA management.  No further concerns at this time.    ROS:   14 system review of systems performed and negative with exception of dizziness, nausea, anxiety  PMH:  Past Medical History:  Diagnosis Date  . Anxiety   . Chronic diastolic CHF (congestive heart failure) (HCC)   . Diabetes mellitus without complication (HCC)   . Hyperlipidemia   .  Hypertension   . Hypertensive heart disease   . Noncompliance with medications   . Obesity   . Renal disorder   . Stroke (HCC)   . Systolic dysfunction    a. TTE 06/2014: EF 45-50%, normal wall motion,  mild MR, mild AI    PSH:  Past Surgical History:  Procedure Laterality Date  . NO PAST SURGERIES      Social History:  Social History   Socioeconomic History  . Marital status: Married    Spouse name: Not on file  . Number of children: 1  . Years of education: 24  . Highest education level: 12th grade  Occupational History  . Not on file  Tobacco Use  . Smoking status: Never Smoker  . Smokeless tobacco: Never Used  Substance and Sexual Activity  . Alcohol use: Not Currently    Comment: Very infrequently; ~2-3 drinks/year   . Drug use: No  . Sexual activity: Yes    Birth control/protection: None  Other Topics Concern  . Not on file  Social History Narrative  . Not on file   Social Determinants of Health   Financial Resource Strain:   . Difficulty of Paying Living Expenses: Not on file  Food Insecurity:   . Worried About Programme researcher, broadcasting/film/video in the Last Year: Not on file  . Ran Out of Food in the Last Year: Not on file  Transportation Needs:   . Lack of Transportation (Medical): Not on file  . Lack of Transportation (Non-Medical): Not on file  Physical Activity:   . Days of Exercise per Week: Not on file  . Minutes of Exercise per Session: Not on file  Stress:   . Feeling of Stress : Not on file  Social Connections:   . Frequency of Communication with Friends and Family: Not on file  . Frequency of Social Gatherings with Friends and Family: Not on file  . Attends Religious Services: Not on file  . Active Member of Clubs or Organizations: Not on file  . Attends Banker Meetings: Not on file  . Marital Status: Not on file  Intimate Partner Violence:   . Fear of Current or Ex-Partner: Not on file  . Emotionally Abused: Not on file  . Physically Abused: Not on file  . Sexually Abused: Not on file    Family History:  Family History  Problem Relation Age of Onset  . Hypertension Mother   . Hyperlipidemia Mother   . Diabetes Mother   . CAD  Father        a. stenting in his early 61s  . Heart disease Father   . Stroke Father   . Heart attack Father   . Diabetes Maternal Grandmother   . Diabetes Maternal Grandfather   . CAD Paternal Grandmother   . CAD Paternal Grandfather   . Colon cancer Paternal Grandfather   . Diabetes Paternal Uncle     Medications:   Current Outpatient Medications on File Prior to Visit  Medication Sig Dispense Refill  . aspirin 325 MG tablet Take 1 tablet (325 mg total) by mouth daily. 90 tablet 0  . atorvastatin (LIPITOR) 80 MG tablet Take 1 tablet (80 mg total) by mouth daily. 90 tablet 4  . carvedilol (COREG) 25 MG tablet Take 25 mg by mouth 2 (two) times daily with a meal.    . clopidogrel (PLAVIX) 75 MG tablet Take 1 tablet (75 mg total) by mouth daily. 30 tablet 3  .  fluticasone (FLONASE) 50 MCG/ACT nasal spray Place 2 sprays into both nostrils daily. Use for 4-6 weeks then stop and use seasonally or as needed. 16 g 3  . liraglutide (VICTOZA) 18 MG/3ML SOPN Inject 1.2 mg into the skin daily.     Marland Kitchen omeprazole (PRILOSEC) 20 MG capsule Take 20 mg by mouth daily.    Marland Kitchen spironolactone (ALDACTONE) 25 MG tablet Take 1 tablet (25 mg total) by mouth daily. 90 tablet 3   No current facility-administered medications on file prior to visit.    Allergies:   Allergies  Allergen Reactions  . Bidil [Isosorb Dinitrate-Hydralazine] Other (See Comments)    Severe headache  . Nitroglycerin Other (See Comments)    Causes severe headaches     Physical Exam  Vitals:   04/21/19 0813  BP: 109/76  Pulse: (!) 105  Temp: 98 F (36.7 C)  TempSrc: Oral  Weight: 266 lb 6.4 oz (120.8 kg)  Height: 5\' 7"  (1.702 m)   Body mass index is 41.72 kg/m. No exam data present  Depression screen [facility] 2/9 04/21/2019  Decreased Interest 2  Down, Depressed, Hopeless 2  PHQ - 2 Score 4  Altered sleeping 2  Tired, decreased energy 3  Change in appetite 3  Feeling bad or failure about yourself  2  Trouble  concentrating 2  Moving slowly or fidgety/restless 2  Suicidal thoughts 1  PHQ-9 Score 19  Difficult doing work/chores Extremely dIfficult  Some recent data might be hidden    GAD 7 : Generalized Anxiety Score 04/21/2019 03/03/2019 12/28/2018 03/22/2018  Nervous, Anxious, on Edge 2 2 0 3  Control/stop worrying 2 3 0 3  Worry too much - different things 3 3 - 3  Trouble relaxing 2 2 0 1  Restless 1 3 0 2  Easily annoyed or irritable 2 3 0 3  Afraid - awful might happen 2 3 0 3  Total GAD 7 Score 14 19 - 18  Anxiety Difficulty Somewhat difficult Somewhat difficult Not difficult at all Very difficult    General: well developed, well nourished, pleasantly anxious middle aged AA male, seated, in no evident distress Head: head normocephalic and atraumatic.   Neck: supple with no carotid or supraclavicular bruits Cardiovascular: regular rate and rhythm, no murmurs Musculoskeletal: no deformity Skin:  no rash/petichiae Vascular:  Normal pulses all extremities   Neurologic Exam Mental Status: Awake and fully alert. Normal speech and language. Oriented to place and time. Recent and remote memory intact. Attention span, concentration and fund of knowledge appropriate. Mood and affect mildly anxious and limited interaction due to dizziness and nausea feeling.   Cranial Nerves: Fundoscopic exam reveals sharp disc margins. Pupils equal, briskly reactive to light. Extraocular movements full with 2-3 beat vertical nystagmus bilaterally.  Visual fields full to confrontation.  Minimal right-sided hearing with subjective tinnitus.  Normal hearing left ear.  Facial sensation intact. Face, tongue, palate moves normally and symmetrically.  Motor: Normal bulk and tone. Normal strength on left side with minimal weakness right side Sensory.: intact to touch , pinprick , position and vibratory sensation.  Coordination: Rapid alternating movements normal in all extremities except decreased right hand dexterity.  Finger-to-nose and heel-to-shin performed accurately bilaterally. Gait and Station: Arises from chair without difficulty. Stance is normal. Gait demonstrates normal stride with imbalance but ambulates without assistive device.  Did not test further gait due to dizziness and nausea sensation during visit Reflexes: 1+ and symmetric. Toes downgoing.     NIHSS  0  Modified Rankin  2-3    Diagnostic Data (Labs, Imaging, Testing)  MR ANGIO HEAD WO CONTRAST 03/20/2019 IMPRESSION:  1. No large vessel occlusion. Possible occlusion of the right AICA, or it could be diminutive or absent.  2. Severe stenosis versus artifact in the left PCA distal P2/proximal P3 segment.  3. Suggestion of intracranial atherosclerosis involving the anterior and posterior circulation, including mild irregularity of the: distal right vertebral artery, basilar artery, supraclinoid right ICA, and bilateral MCA branches.  4. Non dominant left vertebral artery functionally terminates in PICA. Fetal type bilateral PCA origins.   MR ANGIO NECK WO CONTRAST 03/20/2019 IMPRESSION:  Negative neck MRA. Dominant right vertebral artery.   MR BRAIN/IAC W WO CONTRAST 03/19/2019 IMPRESSION:  Small acute infarction involving the right middle cerebellar peduncle. Corresponding enhancement may reflect early blood brain barrier disruption. Given presence of vascular risk factors, ischemia is favored over demyelination. There is also a probable small chronic infarct near the right frontal horn.   Transthoracic Echocardiogram  03/20/2019 IMPRESSIONS  1. Left ventricular ejection fraction, by visual estimation, is 55 to 60%. The left ventricle has normal function. There is severely increased left ventricular hypertrophy.  2. Left ventricular diastolic parameters are consistent with Grade I diastolic dysfunction (impaired relaxation).  3. The left ventricle has no regional wall motion abnormalities.  4. Global right ventricle has normal  systolic function.The right ventricular size is normal. No increase in right ventricular wall thickness.  5. Left atrial size was normal.  6. Right atrial size was normal.  7. The mitral valve is grossly normal. No evidence of mitral valve regurgitation.  8. The tricuspid valve is grossly normal.  9. The tricuspid valve is grossly normal. Tricuspid valve regurgitation is mild.  10. The aortic valve is tricuspid. Aortic valve regurgitation is not visualized. Mild aortic valve sclerosis without stenosis.  11. The pulmonic valve was grossly normal. Pulmonic valve regurgitation is not visualized.  12. The inferior vena cava is normal in size with greater than 50% respiratory variability, suggesting right atrial pressure of 3 mmHg.  TCD w/ bubble 03/22/2019 Negative for PFO  ECG - SR rate 76 BPM. (See cardiology reading for complete details)    ASSESSMENT: Joel [name] is a 39 y.o. year old male presented with dizziness, gait instability, loss of hearing right ear, facial numbness and nausea/vomiting on 03/19/2019 with stroke work-up revealing small acute right middle cerebellar peduncle infarction secondary to large vessel disease with right AICA occlusion. Vascular risk factors include HTN, HLD, DM, CHF, intracranial stenosis and OSA on CPAP.  Residual stroke deficits of mild right hemiparesis, dizziness, right hearing loss, right ear tinnitus, vertigo, nausea and anxiety    PLAN:  1. Right cerebellar stroke: Continue aspirin 325 mg daily and clopidogrel 75 mg daily  and atorvastatin for secondary stroke prevention.  Continue DAPT for total duration of 3 months and then Plavix alone.  Maintain strict control of hypertension with blood pressure goal below 130/90, diabetes with hemoglobin A1c goal below 6.5% and cholesterol with LDL cholesterol (bad cholesterol) goal below 70 mg/dL.  I also advised the patient to eat a healthy diet with plenty of whole grains, cereals, fruits and vegetables,  exercise regularly with at least 30 minutes of continuous activity daily and maintain ideal body weight. 2. HTN: Advised to continue current treatment regimen.  Today's BP stable.  Advised to continue to monitor at home along with continued follow-up with PCP for management 3. HLD: Advised to continue current treatment regimen  along with continued follow-up with PCP for future prescribing and monitoring of lipid panel 4. DMII: Advised to continue to monitor glucose levels at home along with continued follow-up with PCP for management and monitoring 5. Right AICA occlusion: Complete 3 months DAPT and then Plavix alone as well as continuation of statin.  Discussion regarding importance of managing stroke risk factors and will repeat imaging after follow-up visit 6. OSA on CPAP: Ongoing compliance for secondary stroke prevention 7. Vascular vestibular/AICA syndrome, poststroke: Recommend continuation of outpatient physical therapy and will reach out to request/ensure vestibular therapy in place.  Will refer to neurotologist for further evaluation.  Recommend trialing meclizine 12.5 mg 3 times daily as needed for possible benefit of dizziness/vertigo.  Likely panic attack during today's visit with worsening dizziness, nausea and difficulty breathing lasting approximately 10 minutes.  Recommend short duration of Xanax 0.25 mg twice daily as needed for panic attacks and extreme vertigo episodes.  He is reluctant at this time to initiate antidepressant but advised if anxiety continues over the next 3 to 4 weeks, this may have to be considered as long-term benzodiazepine not recommended.  Patient and wife verbalized understanding.    Follow up in 2 months or call earlier if needed   Greater than 50% of time during this 45 minute visit was spent on counseling, explanation of diagnosis of right cerebellar stroke, long discussion regarding vascular vestibular syndrome, reviewing risk factor management of HTN, HLD,  DM, right AICA occlusion, OSA on CPAP, planning of further management along with potential future management, and discussion with patient and family answering all questions.    Ihor Austin, AGNP-BC  [facility] [address] [place], [place] [address]  Phone [phone] Fax [phone] Note: This document was prepared with digital dictation and possible smart phrase technology. Any transcriptional errors that result from this process are unintentional.

## 2019-04-21 NOTE — Progress Notes (Signed)
My Chart message sent to patient with instructions to make sure patient is only taking Carvedilol 6.25 mg twice daily.

## 2019-04-21 NOTE — Therapy (Signed)
[place] [facility] MAIN [facility] SERVICES [address] [place], [place], [address] Phone: [phone]   Fax:  [phone]  Occupational Therapy Treatment  Patient Details  Name: Joel [name] MRN: [id number] Date of Birth: Oct 31, 1980 Referring Provider (OT): Dr. Althea Charon   Encounter Date: 04/21/2019  OT End of Session - 04/21/19 1219    Visit Number  2    Number of Visits  12    Date for OT Re-Evaluation  07/05/19    Authorization Type  Progress reporting period starting 04/12/2019    OT Start Time  1149    OT Stop Time  1230    OT Time Calculation (min)  41 min    Activity Tolerance  Patient tolerated treatment well    Behavior During Therapy  [facility] for tasks assessed/performed       Past Medical History:  Diagnosis Date  . Anxiety   . Chronic diastolic CHF (congestive heart failure) (HCC)   . Diabetes mellitus without complication (HCC)   . Hyperlipidemia   . Hypertension   . Hypertensive heart disease   . Noncompliance with medications   . Obesity   . Renal disorder   . Stroke (HCC)   . Systolic dysfunction    a. TTE 06/2014: EF 45-50%, normal wall motion, mild MR, mild AI    Past Surgical History:  Procedure Laterality Date  . NO PAST SURGERIES      There were no vitals filed for this visit.  Subjective Assessment - 04/21/19 1219    Subjective   Pt. reports dizziness.    Patient is accompanied by:  Family member    Pertinent History  Pt. is a 39 y.o. 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 Pt. 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 PMHx includes: Atherosclerosis, anxiety, CHF, DM, Obesity, Stroke, EF, 45-50%, sleep apnea, HTN, CKD3, and esstenial HTN. Pt. Resides with his wife in a single story home. Pt. enjoys riding motorcycles.    Patient Stated Goals  To regain independence    Currently in Pain?  No/denies       OT TREATMENT    Neuro muscular re-education:  Pt. worked on grasping 1" resistive cubes alternating thumb opposition to the tip of the 2nd through 5th digits while the board is placed at a vertical angle. Pt. worked on pressing the cubes back into place while alternating isolated 2nd through 5th digit extension. Pt. performed [facility] tasks using the Grooved pegboard. Pt. worked on grasping the grooved pegs from a horizontal position, and moving the pegs to a vertical position in the hand to prepare for placing them in the grooved slot. Pt. worked on [facility] skills grasping 1" sticks, 1/4" collars, and 1/4" washers, and building a 3 piece tower.  Therapeutic Exercise:  Pt. Performed hand strengthening with green theraputty. Pt. required cues for proper technique. Pt. worked on gross grip loop, lateral pinch, 3pt. pinch, gross digit extension, digit extension table spread, digit abduction loop, single digit extension loop, thumb opposition, and lumbical ex,  Pt. required verbal and tactile cues for proper technique. Pt. Worked on following a visual handout, and was provided with a HEP through [employer]. Pt. Worked on the [place] grip strength: 92.6#, 86.8#, 77#, and 74.2#.   Pt. reports that he was able to have his BP medication adjusted, and managed. Pt. reports dizziness, and fatigue following PT session  today. Pt. was able to tolerate the OT session well. No rest breaks were required during the session. Pt. is improving with the RUE strength, grip strength, and [place] skills. Pt. demonstrated proper form for green theraputty exercises following a visual handout, with visual demonstration, and cues. Pt. continues to work on improving RUE strength, and [facility] skills in order to work towards increase functional hand use during ADLs, and IADLs.                      OT Education - 04/21/19 1219    Education Details  Green theraputty HEP.    Person(s) Educated  Patient    Methods  Explanation     Comprehension  Verbalized understanding;Returned demonstration;Need further instruction          OT Long Term Goals - 04/12/19 1738      OT LONG TERM GOAL #1   Title  Pt. will improve RUE strength 2 mm grades in order to be able to carry groceries.    Baseline  Eval: Pt is unable to lift, or carry grocery bags secondary to limited BUE strength    Time  12    Period  Weeks    Status  New    Target Date  07/05/19      OT LONG TERM GOAL #2   Title  Pt. will improve right grip strength by 10#  to be able to turn a throttle on his motorcycle handle    Baseline  Eval: Pt. is unable to turn a throttle on the motocycle    Time  12    Period  Weeks    Status  New    Target Date  07/05/19      OT LONG TERM GOAL #3   Title  Pt. will  improve right lateral pinch strength  by 3# to the able to cut meat.    Baseline  Eval: Pt. has difficulty cutting meat.    Time  12    Period  Weeks    Status  New    Target Date  07/05/19      OT LONG TERM GOAL #4   Title  Pt. will be independently, and efficiently brush his teeth using his right hand.    Baseline  Eval: Pt. has difficulty brushing his teeth.    Time  12    Period  Weeks    Status  New    Target Date  07/05/19      OT LONG TERM GOAL #5   Title  Pt. will improve right hand [facility] skills by 3 sec. of speed to be able to tie his shoes.    Baseline  Eval: Pt. has difficulty tying shoes.    Time  12    Period  Weeks    Status  New    Target Date  07/05/19      Long Term Additional Goals   Additional Long Term Goals  Yes      OT LONG TERM GOAL #6   Title  Pt. will write on sentence with 100% legibility in preparation for completing paperwork/documents.    Baseline  Eval: 50% legibility for name only.    Time  12    Period  Weeks    Status  New    Target Date  07/05/19      OT LONG TERM GOAL #7   Title  Pt. will demonstrate visual compensatory strategies 100% of the time  when navigating within his environment during ADLs, and  IADL tasks.    Baseline  Eval: Pt. has difficulty tracking/scanning to far right.    Time  12    Period  Weeks    Status  New    Target Date  07/05/19            Plan - 04/21/19 1220    Clinical Impression Statement Pt. reports that he was able to have his BP medication adjusted, and managed. Pt. reports dizziness, and fatigue following PT session today. Pt. was able to tolerate the OT session well. No rest breaks were required during the session. Pt. is improving with the RUE strength, grip strength, and [facility] skills. Pt. demonstrated proper form for green theraputty exercises following a visual handout, with visual demonstration, and cues. Pt. continues to work on improving RUE strength, and [facility] skills in order to work towards increase functional hand use during ADLs, and IADLs.    OT Occupational Profile and History  Detailed Assessment- Review of Records and additional review of physical, cognitive, psychosocial history related to current functional performance    Occupational performance deficits (Please refer to evaluation for details):  ADL's;IADL's    Rehab Potential  Good    Clinical Decision Making  Several treatment options, min-mod task modification necessary    Comorbidities Affecting Occupational Performance:  May have comorbidities impacting occupational performance    Modification or Assistance to Complete Evaluation   Min-Moderate modification of tasks or assist with assess necessary to complete eval    OT Frequency  1x / week    OT Duration  12 weeks    OT Treatment/Interventions  Self-care/ADL training;Therapeutic exercise;DME and/or AE instruction;Energy conservation;Neuromuscular education;Cognitive remediation/compensation;Patient/family education;Visual/perceptual remediation/compensation;Therapeutic activities    Consulted and Agree with Plan of Care  Patient       Patient will benefit from skilled therapeutic intervention in order to improve the following deficits  and impairments:           Visit Diagnosis: Muscle weakness (generalized)  Other lack of coordination    Problem List Patient Active Problem List   Diagnosis Date Noted  . Orthostatic hypotension 04/14/2019  . Acute CVA (cerebrovascular accident) (HCC) 03/19/2019  . 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 Depression, major, recurrent, moderate (HCC) 12/19/2017  . Fatigue 04/07/2017  . Dilated cardiomyopathy (HCC) 02/23/2017  . Hypertensive heart disease with heart failure (HCC) 02/23/2017  . Essential hypertension 02/21/2017  . 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 H/O medication noncompliance 02/12/2017  . Chronic kidney disease (CKD), stage III (moderate) 02/12/2017  . Systolic CHF, chronic (HCC) 10/11/2014    Olegario Messier, MS, OTR/L 04/21/2019, 3:41 PM  [place] [facility] MAIN [facility] SERVICES [address] [place], [place], [address] Phone: [phone]   Fax:  [phone]  Name: Joel [name] MRN: [id number] Date of Birth: 1980/04/07

## 2019-04-21 NOTE — Patient Instructions (Addendum)
Continue aspirin 325 mg daily and clopidogrel 75 mg daily  and lipitor  for secondary stroke prevention  Continue aspirin and plavix for 2 additional months and then plavix alone  Trial meclizine 12.5mg  three times daily as needed for dizziness/vertigo - please call after 1 week if no improvement  Recommend xanax 0.25mg  twice daily as needed for severe vertigo and anxiety  You will be referred to vestibular provider for further evaluation  Continue to work with physical therapy - will reach out to ensure they are working on vestibular therapies  Continue to follow up with PCP regarding cholesterol, blood pressure and diabetes management   Continue to monitor blood pressure at home  Maintain strict control of hypertension with blood pressure goal below 130/90, diabetes with hemoglobin A1c goal below 6.5% and cholesterol with LDL cholesterol (bad cholesterol) goal below 70 mg/dL. I also advised the patient to eat a healthy diet with plenty of whole grains, cereals, fruits and vegetables, exercise regularly and maintain ideal body weight.  Followup in the future with me in 2 months or call earlier if needed       Thank you for coming to see [place] at [facility]. I hope we have been able to provide you high quality care today.  You may receive a patient satisfaction survey over the next few weeks. We would appreciate your feedback and comments so that we may continue to improve ourselves and the health of our patients.

## 2019-04-21 NOTE — Therapy (Signed)
[place] [facility] MAIN [facility] SERVICES [address] [place], [place], [address] Phone: [phone]   Fax:  [phone]  Physical Therapy Treatment  Patient Details  Name: Joel [name] MRN: [id number] Date of Birth: [birth year] No data recorded  Encounter Date: 04/21/2019  PT End of Session - 04/21/19 1107    Visit Number  3    Number of Visits  16    Date for PT Re-Evaluation  06/03/19    Authorization Type  Medicaid    Progress Note Due on Visit  3    PT Start Time  1105    PT Stop Time  1143    PT Time Calculation (min)  38 min    Equipment Utilized During Treatment  Gait belt    Activity Tolerance  Patient limited by fatigue;Other (comment)    Behavior During Therapy  WFL for tasks assessed/performed       Past Medical History:  Diagnosis Date  . Anxiety   . Chronic diastolic CHF (congestive heart failure) (HCC)   . Diabetes mellitus without complication (HCC)   . Hyperlipidemia   . Hypertension   . Hypertensive heart disease   . Noncompliance with medications   . Obesity   . Renal disorder   . Stroke (HCC)   . Systolic dysfunction    a. TTE 06/2014: EF 45-50%, normal wall motion, mild MR, mild AI    Past Surgical History:  Procedure Laterality Date  . NO PAST SURGERIES      Vitals:   04/21/19 1106  BP: (!) 132/96    Subjective Assessment - 04/21/19 1106    Subjective  Patient reports buzzing in right ear without nystagmus or visual changes..    Pertinent History  Pt c/o difficulty with standing and ambulation due to muscle weakness and decreased endurance.  He also c/o persistent dizziness.  Pt states he has a collapsable cane, but no walker at home.  He has 4-5 steps with rails to exit/enter home, and lives in a 1-level home.  Pt was not working prior to the CVA, and states he was already on permanent disability for HTN, kidney failure, and CHF.  He reports a hx of "mini strokes" in his R eye and c/o a "static spot" in the R  eye.  Pt has been able to ambulate shorter household distances, but fatigues with ambulating more than 7-10 meters.  Pt states that he is independent with all transfers and toileting, but that he gets "dizzy and disoriented" at times. Pt c/o HA's 2-3x/day.    How long can you stand comfortably?  1-2 minutes    How long can you walk comfortably?  1 minute    Patient Stated Goals  "To ride my motorcycle again, and get back to myself"    Currently in Pain?  No/denies    Pain Score  0-No pain       Ther-ex  Octane fitness x 5 mins , L3 Hip flexion marches  x 10 bilateral; Lunges to BOSU ball x 15 BLE Step ups to 6-inch stool x 20  LAQ with 3 lbs and 3 sec hold x 15 x 2 sets Seated marching with 3 lbs x 15 x 2 sets  Hip abd/ER with RTB x 20 x 2 Matrix fwd/bwd x 1 rep and patient has increased dizziness symptoms with 22 . 5 lbs  Patient needs occasional verbal cueing to improve posture and cueing to correctly perform exercises slowly, holding at end of  range to increase motor firing of desired muscle to encourage fatigue.                         PT Education - 04/21/19 1107    Education Details  HEP    Person(s) Educated  Patient    Methods  Explanation    Comprehension  Verbalized understanding;Need further instruction       PT Short Term Goals - 04/09/19 0815      PT SHORT TERM GOAL #1   Title  Pt will be educated in and independent with a home exercise program to improve his overall muscle strength and standing balance, thereby decreasing his fall risk.    Baseline  Pt currently has no exercise program.    Time  1    Period  Weeks    Status  New    Target Date  04/15/19      PT SHORT TERM GOAL #2   Title  Pt wil be educated in the proper use of appropriate assistive device for household and community ambulation to improve patient's safety.    Baseline  Pt currently at a high fall risk and not using an assistive device.    Time  1    Period  Weeks     Status  New    Target Date  04/15/19        PT Long Term Goals - 04/09/19 1540      PT LONG TERM GOAL #1   Title  Pt wil have improved overall B LE muscle strength by at least 1/2-1 muscle grade grossly throughout to allow him to transfer, stand, and ambulate more safely.    Baseline  Pt's R UE and LE mm. strength is 2+-3/5 grossly throughout, and L UE and LE is 4-4+/5 grossly throughout.    Time  8    Period  Weeks    Status  New    Target Date  06/03/19      PT LONG TERM GOAL #2   Title  Pt will ambulate at least 75 meters or more, with improved posture, step length, gait speed with least restrictive assistive device.    Baseline  Pt currently ambulates 7-10 meters with FWW with rounded shoulders and forward trunk lean, with a shuffling pattern and decreased step length and gait speed.    Time  8    Period  Weeks    Status  New    Target Date  06/03/19      PT LONG TERM GOAL #3   Title  Pt will have an improved 5 Times Sit to Stand score to at least 17 seconds or better, indicating decreased fall risk.    Baseline  Pt performed 5x Sit to Stand in 27.32 seconds at initial evaluation, indicating a high fall risk.    Time  8    Period  Weeks    Status  New    Target Date  06/03/19      PT LONG TERM GOAL #4   Title  Pt will report being able to ambulate longer community distances for shopping and appointments due to improved dynamic balance and overall muscle strength.    Baseline  Pt reports that he currently cannot ambulate for more than 30 seconds- 1 minute (or greater than 10 meters) without being limited by weakness, fatigue, and dizziness.    Time  8    Period  Weeks    Status  New    Target Date  06/03/19            Plan - 04/21/19 1120    Clinical Impression Statement   Pt was able to perform  exercises today with CGA.Marland Kitchen Pt was able to perform  balance and strength exercises, but had increased dizziness and needed to stop standing exercises and begin seated LE  exercises. Patients BP decreased to 124/82 but he continued to have symptoms of dizziness if he was standing.   Pt requires verbal, visual and tactile cues during exercise in order to complete tasks with proper form and technique.  Pt would continue to benefit from skilled PT services in order to further strengthen LE's, improve static and dynamic balance, order to increase functional mobility.   Personal Factors and Comorbidities  Comorbidity 3+    Rehab Potential  Good    PT Frequency  2x / week    PT Duration  8 weeks    PT Treatment/Interventions  Gait training;Stair training;Functional mobility training;Therapeutic activities;Therapeutic exercise;Balance training;Neuromuscular re-education;Patient/family education;Manual techniques    PT Next Visit Plan  [place] if appropriate; initiate HEP    PT Home Exercise Plan  See visit 2 note.    Consulted and Agree with Plan of Care  Patient       Patient will benefit from skilled therapeutic intervention in order to improve the following deficits and impairments:  Abnormal gait, Decreased activity tolerance, Decreased balance, Decreased coordination, Decreased endurance, Decreased mobility, Decreased range of motion, Decreased safety awareness, Decreased strength, Difficulty walking  Visit Diagnosis: Other lack of coordination  Muscle weakness (generalized)  Difficulty in walking, not elsewhere classified  Dizziness and giddiness     Problem List Patient Active Problem List   Diagnosis Date Noted  . Orthostatic hypotension 04/14/2019  . Acute CVA (cerebrovascular accident) (HCC) 03/19/2019  . 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 Depression, major, recurrent, moderate (HCC) 12/19/2017  . Fatigue 04/07/2017  . Dilated cardiomyopathy (HCC) 02/23/2017  . Hypertensive heart disease with heart failure (HCC) 02/23/2017  . Essential hypertension 02/21/2017  . Uncontrolled type 2 diabetes mellitus  with nephropathy (HCC) 02/21/2017  . H/O medication noncompliance 02/12/2017  . Chronic kidney disease (CKD), stage III (moderate) 02/12/2017  . Systolic CHF, chronic (HCC) 10/11/2014    Ezekiel Ina, [place] DPT 04/21/2019, 11:21 AM  [place] [facility] MAIN [facility] SERVICES [address] [place], [place], [address] Phone: [phone]   Fax:  [phone]  Name: Joel [name] MRN: [id number] Date of Birth: 13-Aug-1980

## 2019-04-22 ENCOUNTER — Ambulatory Visit: Payer: Medicaid Other

## 2019-04-26 ENCOUNTER — Ambulatory Visit: Payer: Medicaid Other

## 2019-04-26 DIAGNOSIS — Z794 Long term (current) use of insulin: Secondary | ICD-10-CM

## 2019-04-26 DIAGNOSIS — Z6841 Body Mass Index (BMI) 40.0 and over, adult: Secondary | ICD-10-CM

## 2019-04-26 DIAGNOSIS — E1121 Type 2 diabetes mellitus with diabetic nephropathy: Secondary | ICD-10-CM

## 2019-04-27 ENCOUNTER — Ambulatory Visit: Payer: Medicaid Other | Admitting: Physical Therapy

## 2019-04-27 ENCOUNTER — Ambulatory Visit: Payer: Medicaid Other | Admitting: Occupational Therapy

## 2019-04-27 DIAGNOSIS — Z6841 Body Mass Index (BMI) 40.0 and over, adult: Secondary | ICD-10-CM | POA: Insufficient documentation

## 2019-04-27 NOTE — Addendum Note (Signed)
Addended by: [name] on: 04/27/2019 08:16 AM   Modules accepted: Orders

## 2019-04-28 ENCOUNTER — Ambulatory Visit: Payer: Medicaid Other

## 2019-04-29 ENCOUNTER — Ambulatory Visit: Payer: Medicaid Other

## 2019-04-29 ENCOUNTER — Encounter: Payer: Medicaid Other | Admitting: Occupational Therapy

## 2019-05-03 ENCOUNTER — Ambulatory Visit: Payer: Medicaid Other

## 2019-05-04 ENCOUNTER — Ambulatory Visit: Payer: Medicaid Other | Admitting: Physical Therapy

## 2019-05-05 ENCOUNTER — Ambulatory Visit: Payer: Medicaid Other

## 2019-05-06 ENCOUNTER — Ambulatory Visit: Payer: Medicaid Other | Admitting: Occupational Therapy

## 2019-05-06 ENCOUNTER — Ambulatory Visit: Payer: Medicaid Other

## 2019-05-10 ENCOUNTER — Ambulatory Visit: Payer: Medicaid Other

## 2019-05-10 ENCOUNTER — Ambulatory Visit: Payer: Medicaid Other | Admitting: Physical Therapy

## 2019-05-12 ENCOUNTER — Encounter: Payer: Medicaid Other | Admitting: Occupational Therapy

## 2019-05-12 ENCOUNTER — Ambulatory Visit: Payer: Medicaid Other

## 2019-05-13 ENCOUNTER — Ambulatory Visit: Payer: Medicaid Other

## 2019-05-17 ENCOUNTER — Ambulatory Visit: Payer: Medicaid Other

## 2019-05-17 NOTE — Progress Notes (Signed)
I agree with the above plan 

## 2019-05-19 ENCOUNTER — Encounter: Payer: Medicaid Other | Admitting: Occupational Therapy

## 2019-05-19 ENCOUNTER — Ambulatory Visit: Payer: Medicaid Other

## 2019-05-19 ENCOUNTER — Ambulatory Visit: Payer: Medicaid Other | Admitting: Physical Therapy

## 2019-05-20 ENCOUNTER — Ambulatory Visit: Payer: Medicaid Other

## 2019-05-24 ENCOUNTER — Ambulatory Visit: Payer: Medicaid Other

## 2019-05-26 ENCOUNTER — Ambulatory Visit: Payer: Medicaid Other

## 2019-05-26 ENCOUNTER — Encounter: Payer: Medicaid Other | Admitting: Occupational Therapy

## 2019-05-26 ENCOUNTER — Ambulatory Visit: Payer: Medicaid Other | Admitting: Physical Therapy

## 2019-05-27 ENCOUNTER — Ambulatory Visit: Payer: Medicaid Other

## 2019-05-31 ENCOUNTER — Ambulatory Visit: Payer: Medicaid Other

## 2019-06-02 ENCOUNTER — Ambulatory Visit: Payer: Medicaid Other | Admitting: Physical Therapy

## 2019-06-02 ENCOUNTER — Encounter: Payer: Medicaid Other | Admitting: Occupational Therapy

## 2019-06-02 ENCOUNTER — Ambulatory Visit: Payer: Medicaid Other

## 2019-06-03 ENCOUNTER — Ambulatory Visit: Payer: Medicaid Other

## 2019-06-04 ENCOUNTER — Other Ambulatory Visit: Payer: Self-pay

## 2019-06-04 ENCOUNTER — Encounter: Payer: Self-pay | Admitting: Family Medicine

## 2019-06-04 ENCOUNTER — Ambulatory Visit (INDEPENDENT_AMBULATORY_CARE_PROVIDER_SITE_OTHER): Payer: Medicaid Other | Admitting: Family Medicine

## 2019-06-04 DIAGNOSIS — J029 Acute pharyngitis, unspecified: Secondary | ICD-10-CM | POA: Diagnosis not present

## 2019-06-04 DIAGNOSIS — R509 Fever, unspecified: Secondary | ICD-10-CM | POA: Diagnosis not present

## 2019-06-04 NOTE — Patient Instructions (Addendum)
Thank you for coming to the office today.  You may have coronavirus / COVID19  [place] COVID19 Testing Information  LOCATION:  [facility]) [address] [employer] [place] [address]  Hours: Monday - Friday 2:00pm to 5:00pm  COVID-19 Testing By Appointment Only  Online scheduling can be done online at [email or web address] or by texting "COVID" to [phone].  Test result may take 2-7 days to result. You will be notified by MyChart or by Phone.  (Pre-Admission prior to surgery or procedures at hospital will still be done at [facility])  If negative test - they will call you with result. If abnormal or positive test you will be notified as well and our office will contact you to help further with treatment plan.  May take Tylenol as needed for aches pains and fever. Prefer to avoid Ibuprofen if can help it, to avoid complication from virus.  REQUIRED self quarantine to AVOID POTENTIAL SPREAD - advised to avoid all exposure with others while during treatment. Should continue to quarantine for up to 7-14 days, pending resolution of symptoms, if symptoms resolve by 7 days and is afebrile >3 days - may STOP self quarantine at that time.  If symptoms do not resolve or significantly improve OR if WORSENING - fever / cough - or worsening shortness of breath - then should contact us and seek advice on next steps in treatment at home vs where/when to seek care at Urgent Care or Hospital ED for further intervention   Please schedule a Follow-up Appointment to: Return in about 1 week (around 06/11/2019), or if symptoms worsen or fail to improve, for pharyngitis / fever.  If you have any other questions or concerns, please feel free to call the office or send a message through MyChart. You may also schedule an earlier appointment if necessary.  Additionally, you may be receiving a survey about your experience at our office within a few days  to 1 week by e-mail or mail. We value your feedback.  Saralyn Pilar, DO [facility], [place]

## 2019-06-04 NOTE — Progress Notes (Signed)
Virtual Visit via Telephone The purpose of this virtual visit is to provide medical care while limiting exposure to the novel coronavirus (COVID19) for both patient and office staff.  Consent was obtained for phone visit:  Yes.   Answered questions that patient had about telehealth interaction:  Yes.   I discussed the limitations, risks, security and privacy concerns of performing an evaluation and management service by telephone. I also discussed with the patient that there may be a patient responsible charge related to this service. The patient expressed understanding and agreed to proceed.  Patient Location: Home Provider Location: [facility])  ---------------------------------------------------------------------- Chief Complaint  Patient presents with  . Sore Throat    seen white spots on the right side hurts onset 2 days as per pt throat doesn't hurt but just the spots, low grade fever, chills, bodyache onset yesterday     S: Reviewed CMA documentation. I have called patient and spoke to him and his wife, gathered additional HPI as follows:  ACUTE PHARYNGITIS / FEVER Reports that symptoms started yesterday with chills and hot feeling off temperature, last night felt fever broke, said there was spot on R side of throat with white spots and was some redness, no obvious swelling, no significant pain with throat, he has some low grade temp still not measured, some bodyache as well. - he has not had COVID19 vaccine yet. He has not had any recent COVID testing or sick contact   Denies any known or suspected exposure to person with or possibly with COVID19.  Denies any cough, shortness of breath, sinus pain or pressure, headache, abdominal pain, diarrhea  Past Medical History:  Diagnosis Date  . Anxiety   . Chronic diastolic CHF (congestive heart failure) (HCC)   . Hyperlipidemia   . Hypertension   . Hypertensive heart disease   . Noncompliance with  medications   . Obesity   . Renal disorder   . Stroke (HCC)   . Systolic dysfunction    a. TTE 06/2014: EF 45-50%, normal wall motion, mild MR, mild AI   Social History   Tobacco Use  . Smoking status: Never Smoker  . Smokeless tobacco: Never Used  Substance Use Topics  . Alcohol use: Not Currently    Comment: Very infrequently; ~2-3 drinks/year   . Drug use: No    Current Outpatient Medications:  .  ALPRAZolam (XANAX) 0.25 MG tablet, Take 1 tablet (0.25 mg total) by mouth 2 (two) times daily as needed for anxiety., Disp: 30 tablet, Rfl: 0 .  aspirin 325 MG tablet, Take 1 tablet (325 mg total) by mouth daily., Disp: 90 tablet, Rfl: 0 .  atorvastatin (LIPITOR) 80 MG tablet, Take 1 tablet (80 mg total) by mouth daily., Disp: 90 tablet, Rfl: 4 .  carvedilol (COREG) 6.25 MG tablet, Take 1 tablet (6.25 mg total) by mouth 2 (two) times daily., Disp: 180 tablet, Rfl: 3 .  clopidogrel (PLAVIX) 75 MG tablet, Take 1 tablet (75 mg total) by mouth daily., Disp: 30 tablet, Rfl: 3 .  empagliflozin (JARDIANCE) 25 MG TABS tablet, Take by mouth., Disp: , Rfl:  .  fluticasone (FLONASE) 50 MCG/ACT nasal spray, Place 2 sprays into both nostrils daily. Use for 4-6 weeks then stop and use seasonally or as needed., Disp: 16 g, Rfl: 3 .  liraglutide (VICTOZA) 18 MG/3ML SOPN, Inject 1.2 mg into the skin daily. , Disp: , Rfl:  .  meclizine (ANTIVERT) 12.5 MG tablet, Take 1 tablet (12.5 mg  total) by mouth 3 (three) times daily as needed for dizziness., Disp: 90 tablet, Rfl: 2 .  omeprazole (PRILOSEC) 20 MG capsule, Take 20 mg by mouth daily., Disp: , Rfl:  .  spironolactone (ALDACTONE) 25 MG tablet, Take 1 tablet (25 mg total) by mouth daily., Disp: 90 tablet, Rfl: 3  Depression screen [facility] 2/9 06/04/2019 04/21/2019 03/03/2019  Decreased Interest 0 2 1  Down, Depressed, Hopeless 0 2 2  PHQ - 2 Score 0 4 3  Altered sleeping - 2 1  Tired, decreased energy - 3 3  Change in appetite - 3 3  Feeling bad or failure  about yourself  - 2 3  Trouble concentrating - 2 2  Moving slowly or fidgety/restless - 2 1  Suicidal thoughts - 1 2  PHQ-9 Score - 19 18  Difficult doing work/chores - Extremely dIfficult Somewhat difficult  Some recent data might be hidden    GAD 7 : Generalized Anxiety Score 04/21/2019 03/03/2019 12/28/2018 03/22/2018  Nervous, Anxious, on Edge 2 2 0 3  Control/stop worrying 2 3 0 3  Worry too much - different things 3 3 - 3  Trouble relaxing 2 2 0 1  Restless 1 3 0 2  Easily annoyed or irritable 2 3 0 3  Afraid - awful might happen 2 3 0 3  Total GAD 7 Score 14 19 - 18  Anxiety Difficulty Somewhat difficult Somewhat difficult Not difficult at all Very difficult    -------------------------------------------------------------------------- O: No physical exam performed due to remote telephone encounter.  Lab results reviewed.  Recent Results (from the past 2160 hour(s))  CBC with Differential     Status: Abnormal   Collection Time: 03/19/19  3:21 PM  Result Value Ref Range   WBC 6.3 4.0 - 10.5 K/uL   RBC 5.61 4.22 - 5.81 MIL/uL   Hemoglobin 15.1 13.0 - 17.0 g/dL   HCT 40.9 81.1 - 91.4 %   MCV 85.6 80.0 - 100.0 fL   MCH 26.9 26.0 - 34.0 pg   MCHC 31.5 30.0 - 36.0 g/dL   RDW 78.2 (H) 95.6 - 21.3 %   Platelets 227 150 - 400 K/uL   nRBC 0.0 0.0 - 0.2 %   Neutrophils Relative % 65 %   Neutro Abs 4.1 1.7 - 7.7 K/uL   Lymphocytes Relative 26 %   Lymphs Abs 1.6 0.7 - 4.0 K/uL   Monocytes Relative 7 %   Monocytes Absolute 0.4 0.1 - 1.0 K/uL   Eosinophils Relative 2 %   Eosinophils Absolute 0.1 0.0 - 0.5 K/uL   Basophils Relative 0 %   Basophils Absolute 0.0 0.0 - 0.1 K/uL   Immature Granulocytes 0 %   Abs Immature Granulocytes 0.02 0.00 - 0.07 K/uL    Comment: Performed at [facility] Lab, [address], [place], [place] [address]  Comprehensive metabolic panel     Status: Abnormal   Collection Time: 03/19/19  3:21 PM  Result Value Ref Range   Sodium 137 135 - 145  mmol/L   Potassium 4.0 3.5 - 5.1 mmol/L   Chloride 101 98 - 111 mmol/L   CO2 23 22 - 32 mmol/L   Glucose, Bld 214 (H) 70 - 99 mg/dL   BUN 22 (H) 6 - 20 mg/dL   Creatinine, Ser 8.46 (H) 0.61 - 1.24 mg/dL   Calcium 9.1 8.9 - 96.2 mg/dL   Total Protein 6.7 6.5 - 8.1 g/dL   Albumin 3.3 (L) 3.5 - 5.0  g/dL   AST 19 15 - 41 U/L   ALT 22 0 - 44 U/L   Alkaline Phosphatase 72 38 - 126 U/L   Total Bilirubin 1.1 0.3 - 1.2 mg/dL   GFR calc non Af Amer 30 (L) >60 mL/min   GFR calc Af Amer 34 (L) >60 mL/min   Anion gap 13 5 - 15    Comment: Performed at [facility] Lab, [address], [place], [place] [address]  APTT     Status: None   Collection Time: 03/19/19  3:21 PM  Result Value Ref Range   aPTT 36 24 - 36 seconds    Comment: Performed at [facility] Lab, [address] [place], [place] [address]  SARS CORONAVIRUS 2 (TAT 6-24 HRS) Nasopharyngeal Nasopharyngeal Swab     Status: None   Collection Time: 03/19/19  6:50 PM   Specimen: Nasopharyngeal Swab  Result Value Ref Range   SARS Coronavirus 2 NEGATIVE NEGATIVE    Comment: (NOTE) SARS-CoV-2 target nucleic acids are NOT DETECTED. The SARS-CoV-2 RNA is generally detectable in upper and lower respiratory specimens during the acute phase of infection. Negative results do not preclude SARS-CoV-2 infection, do not rule out co-infections with other pathogens, and should not be used as the sole basis for treatment or other patient management decisions. Negative results must be combined with clinical observations, patient history, and epidemiological information. The expected result is Negative. Fact Sheet for Patients: HairSlick.no Fact Sheet for Healthcare Providers: quierodirigir.com This test is not yet approved or cleared by the [place] FDA and  has been authorized for detection and/or diagnosis of SARS-CoV-2 by FDA under an Emergency Use Authorization (EUA). This EUA  will remain  in effect (meaning this test can be used) for the duration of the COVID-19 declaration under Section 56 4(b)(1) of the Act, 21 U.S.C. section 360bbb-3(b)(1), unless the authorization is terminated or revoked sooner. Performed at [facility] Lab, [address], [place], [place] [address]   Urine rapid drug screen (hosp performed)     Status: Abnormal   Collection Time: 03/19/19  7:40 PM  Result Value Ref Range   Opiates NONE DETECTED NONE DETECTED   Cocaine NONE DETECTED NONE DETECTED   Benzodiazepines POSITIVE (A) NONE DETECTED   Amphetamines NONE DETECTED NONE DETECTED   Tetrahydrocannabinol NONE DETECTED NONE DETECTED   Barbiturates NONE DETECTED NONE DETECTED    Comment: (NOTE) DRUG SCREEN FOR MEDICAL PURPOSES ONLY.  IF CONFIRMATION IS NEEDED FOR ANY PURPOSE, NOTIFY LAB WITHIN 5 DAYS. LOWEST DETECTABLE LIMITS FOR URINE DRUG SCREEN Drug Class                     Cutoff (ng/mL) Amphetamine and metabolites    1000 Barbiturate and metabolites    200 Benzodiazepine                 200 Tricyclics and metabolites     300 Opiates and metabolites        300 Cocaine and metabolites        300 THC                            50 Performed at [facility] Lab, [address], [place], [place] [address]   Urinalysis, Routine w reflex microscopic     Status: Abnormal   Collection Time: 03/19/19  7:40 PM  Result Value Ref Range   Color,  Urine YELLOW YELLOW   APPearance HAZY (A) CLEAR   Specific Gravity, Urine 1.033 (H) 1.005 - 1.030   pH 5.0 5.0 - 8.0   Glucose, UA >=500 (A) NEGATIVE mg/dL   Hgb urine dipstick NEGATIVE NEGATIVE   Bilirubin Urine NEGATIVE NEGATIVE   Ketones, ur 5 (A) NEGATIVE mg/dL   Protein, ur NEGATIVE NEGATIVE mg/dL   Nitrite NEGATIVE NEGATIVE   Leukocytes,Ua NEGATIVE NEGATIVE   WBC, UA 0-5 0 - 5 WBC/hpf   Bacteria, UA FEW (A) NONE SEEN   Mucus PRESENT    Granular Casts, UA PRESENT     Comment: Performed at [facility] Lab, [address], [place], [place] [address]  Ethanol     Status: None   Collection Time: 03/19/19  7:44 PM  Result Value Ref Range   Alcohol, Ethyl (B) <10 <10 mg/dL    Comment: (NOTE) Lowest detectable limit for serum alcohol is 10 mg/dL. For medical purposes only. Performed at [facility] Lab, [address] [place], [place] [address]   Troponin I (High Sensitivity)     Status: Abnormal   Collection Time: 03/19/19 11:52 PM  Result Value Ref Range   Troponin I (High Sensitivity) 46 (H) <18 ng/L    Comment: (NOTE) Elevated high sensitivity troponin I (hsTnI) values and significant  changes across serial measurements may suggest ACS but many other  chronic and acute conditions are known to elevate hsTnI results.  Refer to the "Links" section for chest pain algorithms and additional  guidance. Performed at [facility] Lab, [address], [place], [place] [address]   HIV Antibody (routine testing w rflx)     Status: None   Collection Time: 03/20/19  1:13 AM  Result Value Ref Range   HIV Screen 4th Generation wRfx NON REACTIVE NON REACTIVE    Comment: Performed at [facility] Lab, [address], [place], [place] [address]  Hemoglobin A1c     Status: Abnormal   Collection Time: 03/20/19  1:13 AM  Result Value Ref Range   Hgb A1c MFr Bld 7.0 (H) 4.8 - 5.6 %    Comment: (NOTE) Pre diabetes:          5.7%-6.4% Diabetes:              >6.4% Glycemic control for   <7.0% adults with diabetes    Mean Plasma Glucose 154.2 mg/dL    Comment: Performed at [facility] Lab, [address] [place], [place] [address]  Lipid panel     Status: Abnormal   Collection Time: 03/20/19  1:13 AM  Result Value Ref Range   Cholesterol 264 (H) 0 - 200 mg/dL   Triglycerides 177 (H) <150 mg/dL   HDL 21 (L) >93 mg/dL   Total CHOL/HDL Ratio 12.6 RATIO   VLDL 32 0 - 40 mg/dL   LDL Cholesterol 903 (H) 0 - 99 mg/dL    Comment:        Total Cholesterol/HDL:CHD Risk Coronary Heart Disease Risk Table                      Men   Women  1/2 Average Risk   3.4   3.3  Average Risk       5.0   4.4  2 X Average Risk   9.6   7.1  3 X Average Risk  23.4   11.0        Use the calculated Patient  Ratio above and the CHD Risk Table to determine the patient's CHD Risk.        ATP III CLASSIFICATION (LDL):  <100     mg/dL   Optimal  378-588  mg/dL   Near or Above                    Optimal  130-159  mg/dL   Borderline  502-774  mg/dL   High  >128     mg/dL   Very High Performed at [facility] Lab, [address], [place], [place] [address]   Troponin I (High Sensitivity)     Status: Abnormal   Collection Time: 03/20/19  1:13 AM  Result Value Ref Range   Troponin I (High Sensitivity) 46 (H) <18 ng/L    Comment: (NOTE) Elevated high sensitivity troponin I (hsTnI) values and significant  changes across serial measurements may suggest ACS but many other  chronic and acute conditions are known to elevate hsTnI results.  Refer to the "Links" section for chest pain algorithms and additional  guidance. Performed at [facility] Lab, [address], [place], [place] [address]   Comprehensive metabolic panel     Status: Abnormal   Collection Time: 03/20/19  3:44 AM  Result Value Ref Range   Sodium 139 135 - 145 mmol/L   Potassium 3.6 3.5 - 5.1 mmol/L   Chloride 105 98 - 111 mmol/L   CO2 23 22 - 32 mmol/L   Glucose, Bld 94 70 - 99 mg/dL   BUN 23 (H) 6 - 20 mg/dL   Creatinine, Ser 7.09 (H) 0.61 - 1.24 mg/dL   Calcium 8.7 (L) 8.9 - 10.3 mg/dL   Total Protein 6.1 (L) 6.5 - 8.1 g/dL   Albumin 2.9 (L) 3.5 - 5.0 g/dL   AST 17 15 - 41 U/L   ALT 17 0 - 44 U/L   Alkaline Phosphatase 60 38 - 126 U/L   Total Bilirubin 1.1 0.3 - 1.2 mg/dL   GFR calc non Af Amer 35 (L) >60 mL/min   GFR calc Af Amer 41 (L) >60 mL/min   Anion gap 11 5 - 15    Comment: Performed at [facility] Lab, [address], [place], [place] [address]  CBC with Differential/Platelet     Status: Abnormal   Collection Time:  03/20/19  3:44 AM  Result Value Ref Range   WBC 6.8 4.0 - 10.5 K/uL   RBC 5.09 4.22 - 5.81 MIL/uL   Hemoglobin 13.9 13.0 - 17.0 g/dL   HCT 62.9 47.6 - 54.6 %   MCV 83.3 80.0 - 100.0 fL   MCH 27.3 26.0 - 34.0 pg   MCHC 32.8 30.0 - 36.0 g/dL   RDW 50.3 (H) 54.6 - 56.8 %   Platelets 190 150 - 400 K/uL   nRBC 0.0 0.0 - 0.2 %   Neutrophils Relative % 49 %   Neutro Abs 3.3 1.7 - 7.7 K/uL   Lymphocytes Relative 38 %   Lymphs Abs 2.6 0.7 - 4.0 K/uL   Monocytes Relative 10 %   Monocytes Absolute 0.7 0.1 - 1.0 K/uL   Eosinophils Relative 3 %   Eosinophils Absolute 0.2 0.0 - 0.5 K/uL   Basophils Relative 0 %   Basophils Absolute 0.0 0.0 - 0.1 K/uL   Immature Granulocytes 0 %   Abs Immature Granulocytes 0.02 0.00 - 0.07 K/uL    Comment: Performed at [facility] Lab, [address] [place], [place] [address]  ECHOCARDIOGRAM COMPLETE     Status: None   Collection Time: 03/20/19 10:59 AM  Result Value Ref Range   Weight 4,320 oz   Height 67 in   BP 110/81 mmHg  Glucose, capillary     Status: None   Collection Time: 03/20/19 11:32 AM  Result Value Ref Range   Glucose-Capillary 86 70 - 99 mg/dL   Comment 1 Notify RN    Comment 2 Document in Chart   ANA w/Reflex if Positive     Status: None   Collection Time: 03/20/19  4:01 PM  Result Value Ref Range   Anti Nuclear Antibody (ANA) Negative Negative    Comment: (NOTE) Performed At: [facility] [address] [place], [place] [id number] Jolene Schimke MD [id number]   RPR     Status: None   Collection Time: 03/20/19  4:01 PM  Result Value Ref Range   RPR Ser Ql NON REACTIVE NON REACTIVE    Comment: Performed at [facility] Lab, [address] [place], [place] [address]  Antiphospholipid syndrome eval, bld     Status: Abnormal   Collection Time: 03/20/19  4:01 PM  Result Value Ref Range   Anticardiolipin IgA <9 0 - 11 APL U/mL    Comment: (NOTE)                          Negative:              <12                           Indeterminate:     12 - 20                          Low-Med Positive: >20 - 80                          High Positive:         >80 Performed At: [facility] [address] [place], [place] [id number] Jolene Schimke MD [id number]    Anticardiolipin IgG <9 0 - 14 GPL U/mL    Comment: (NOTE)                          Negative:              <15                          Indeterminate:     15 - 20                          Low-Med Positive: >20 - 80                          High Positive:         >80    Anticardiolipin IgM 10 0 - 12 MPL U/mL    Comment: (NOTE)                          Negative:              <13  Indeterminate:     13 - 20                          Low-Med Positive: >20 - 80                          High Positive:         >80    PTT Lupus Anticoagulant 51.2 0.0 - 51.9 sec   DRVVT 50.2 (H) 0.0 - 47.0 sec   Phosphatydalserine, IgG 3 0 - 11 GPS IgG   Phosphatydalserine, IgM 12 0 - 25 MPS IgM   Phosphatydalserine, IgA 2 0 - 20 APS IgA   Lupus Anticoag Interp Comment:     Comment: (NOTE) Results are consistent with the presence of a lupus anticoagulant. As only persistent lupus anticoagulant (LA) positivity meets laboratory diagnostic criteria for antiphospholipid syndrome, repeat testing in 12 or more weeks is recommended, ideally in the absence of anticoagulant therapy. Important Note: The results of LA testing are not valid for patients receiving heparin, direct Xa inhibitor (e.g., rivaroxaban, apixaban) or direct thrombin inhibitor (e.g., dabigatran) therapy. These drugs may cause false positive LA results but will not interfere with anticardiolipin and beta-2 glycoprotein 1 antibody testing.   dRVVT Mix     Status: Abnormal   Collection Time: 03/20/19  4:01 PM  Result Value Ref Range   dRVVT Mix 43.0 (H) 0.0 - 40.4 sec    Comment: (NOTE) Performed At: [facility] [address] [place], [place]  [id number] Jolene Schimke MD [id number]:[id number]   dRVVT Confirm     Status: Abnormal   Collection Time: 03/20/19  4:01 PM  Result Value Ref Range   dRVVT Confirm 1.3 (H) 0.8 - 1.2 ratio    Comment: (NOTE) Performed At: [facility] [address] [place], [place] [id number] Jolene Schimke MD [id number]   Glucose, capillary     Status: None   Collection Time: 03/20/19  5:20 PM  Result Value Ref Range   Glucose-Capillary 95 70 - 99 mg/dL   Comment 1 Notify RN    Comment 2 Document in Chart   Glucose, capillary     Status: Abnormal   Collection Time: 03/20/19  9:14 PM  Result Value Ref Range   Glucose-Capillary 113 (H) 70 - 99 mg/dL   Comment 1 Notify RN    Comment 2 Document in Chart   Basic metabolic panel     Status: Abnormal   Collection Time: 03/21/19  5:24 AM  Result Value Ref Range   Sodium 138 135 - 145 mmol/L   Potassium 3.1 (L) 3.5 - 5.1 mmol/L   Chloride 104 98 - 111 mmol/L   CO2 24 22 - 32 mmol/L   Glucose, Bld 96 70 - 99 mg/dL   BUN 18 6 - 20 mg/dL   Creatinine, Ser 4.13 (H) 0.61 - 1.24 mg/dL   Calcium 8.3 (L) 8.9 - 10.3 mg/dL   GFR calc non Af Amer 40 (L) >60 mL/min   GFR calc Af Amer 47 (L) >60 mL/min   Anion gap 10 5 - 15    Comment: Performed at [facility] Lab, [address], [place], [place] [address]  CBC     Status: Abnormal   Collection Time: 03/21/19  5:24 AM  Result Value Ref Range   WBC 6.0 4.0 - 10.5 K/uL   RBC 4.99 4.22 - 5.81 MIL/uL   Hemoglobin  13.6 13.0 - 17.0 g/dL   HCT 41.5 39.0 - 52.0 %   MCV 83.2 80.0 - 100.0 fL   MCH 27.3 26.0 - 34.0 pg   MCHC 32.8 30.0 - 36.0 g/dL   RDW 15.7 (H) 11.5 - 15.5 %   Platelets 190 150 - 400 K/uL   nRBC 0.0 0.0 - 0.2 %    Comment: Performed at [facility] [address] [place], [place] [address]  Magnesium     Status: None   Collection Time: 03/21/19  5:24 AM  Result Value Ref Range   Magnesium 2.1 1.7 - 2.4 mg/dL    Comment: Performed at [facility] [address],  [place], [place] [address]  Glucose, capillary     Status: None   Collection Time: 03/21/19  6:06 AM  Result Value Ref Range   Glucose-Capillary 86 70 - 99 mg/dL  Glucose, capillary     Status: Abnormal   Collection Time: 03/21/19 11:57 AM  Result Value Ref Range   Glucose-Capillary 144 (H) 70 - 99 mg/dL   Comment 1 Notify RN    Comment 2 Document in Chart   Glucose, capillary     Status: Abnormal   Collection Time: 03/21/19  3:30 PM  Result Value Ref Range   Glucose-Capillary 119 (H) 70 - 99 mg/dL   Comment 1 Repeat Test   Glucose, capillary     Status: Abnormal   Collection Time: 03/21/19  9:16 PM  Result Value Ref Range   Glucose-Capillary 128 (H) 70 - 99 mg/dL   Comment 1 Notify RN   Basic metabolic panel     Status: Abnormal   Collection Time: 03/22/19  1:51 AM  Result Value Ref Range   Sodium 139 135 - 145 mmol/L   Potassium 3.4 (L) 3.5 - 5.1 mmol/L   Chloride 104 98 - 111 mmol/L   CO2 24 22 - 32 mmol/L   Glucose, Bld 120 (H) 70 - 99 mg/dL   BUN 14 6 - 20 mg/dL   Creatinine, Ser 2.12 (H) 0.61 - 1.24 mg/dL   Calcium 8.7 (L) 8.9 - 10.3 mg/dL   GFR calc non Af Amer 39 (L) >60 mL/min   GFR calc Af Amer 45 (L) >60 mL/min   Anion gap 11 5 - 15    Comment: Performed at [facility], [place] [address] [place], [place] [address]  CBC     Status: Abnormal   Collection Time: 03/22/19  1:51 AM  Result Value Ref Range   WBC 5.2 4.0 - 10.5 K/uL   RBC 4.88 4.22 - 5.81 MIL/uL   Hemoglobin 13.3 13.0 - 17.0 g/dL   HCT 40.2 39.0 - 52.0 %   MCV 82.4 80.0 - 100.0 fL   MCH 27.3 26.0 - 34.0 pg   MCHC 33.1 30.0 - 36.0 g/dL   RDW 15.7 (H) 11.5 - 15.5 %   Platelets 211 150 - 400 K/uL   nRBC 0.0 0.0 - 0.2 %    Comment: Performed at [facility], [place] [address], [place], [place] [address]  Glucose, capillary     Status: None   Collection Time: 03/22/19  6:32 AM  Result Value Ref Range   Glucose-Capillary 99 70 - 99 mg/dL   Comment 1 Notify RN    Comment 2 Document in Chart    Glucose, capillary     Status: Abnormal   Collection Time: 03/22/19 11:46 AM  Result Value  Ref Range   Glucose-Capillary 103 (H) 70 - 99 mg/dL   Comment 1 Notify RN    Comment 2 Document in Chart   Glucose, capillary     Status: Abnormal   Collection Time: 03/22/19  4:10 PM  Result Value Ref Range   Glucose-Capillary 142 (H) 70 - 99 mg/dL  Glucose, capillary     Status: None   Collection Time: 03/22/19  9:02 PM  Result Value Ref Range   Glucose-Capillary 96 70 - 99 mg/dL  Basic metabolic panel     Status: Abnormal   Collection Time: 03/23/19  2:19 AM  Result Value Ref Range   Sodium 141 135 - 145 mmol/L   Potassium 3.6 3.5 - 5.1 mmol/L   Chloride 107 98 - 111 mmol/L   CO2 23 22 - 32 mmol/L   Glucose, Bld 85 70 - 99 mg/dL   BUN 12 6 - 20 mg/dL   Creatinine, Ser 9.89 (H) 0.61 - 1.24 mg/dL   Calcium 8.7 (L) 8.9 - 10.3 mg/dL   GFR calc non Af Amer 38 (L) >60 mL/min   GFR calc Af Amer 44 (L) >60 mL/min   Anion gap 11 5 - 15    Comment: Performed at [facility] Lab, [address] [place], [place] [address]  CBC     Status: Abnormal   Collection Time: 03/23/19  2:19 AM  Result Value Ref Range   WBC 5.2 4.0 - 10.5 K/uL   RBC 4.94 4.22 - 5.81 MIL/uL   Hemoglobin 13.6 13.0 - 17.0 g/dL   HCT 17.4 08.1 - 44.8 %   MCV 83.8 80.0 - 100.0 fL   MCH 27.5 26.0 - 34.0 pg   MCHC 32.9 30.0 - 36.0 g/dL   RDW 18.5 (H) 63.1 - 49.7 %   Platelets 202 150 - 400 K/uL   nRBC 0.0 0.0 - 0.2 %    Comment: Performed at [facility] Lab, [address], [place], [place] [address]  Glucose, capillary     Status: None   Collection Time: 03/23/19  6:17 AM  Result Value Ref Range   Glucose-Capillary 80 70 - 99 mg/dL   Comment 1 Notify RN    Comment 2 Document in Chart   Glucose, capillary     Status: None   Collection Time: 03/23/19 12:03 PM  Result Value Ref Range   Glucose-Capillary 99 70 - 99 mg/dL   Comment 1 Notify RN    Comment 2 Document in Chart      -------------------------------------------------------------------------- A&P:  Problem List Items Addressed This Visit    None    Visit Diagnoses    Acute pharyngitis, unspecified etiology    -  Primary   Fever and chills         Clinically acute onset 24-48 hours febrile illness with fever/chills aches, viral constellation and localized spots in R throat, questionable sore throat  At risk for COVID19 virus, cannot rule out. Less likely bacterial pharyngitis given acute onset and symptomology  Advised first proceed immediately to COVID19 test, info given on how to schedule, however [facility] site is booked until next week Thursday, looked into pharmacy options should be able to go to [facility] for COVID19 testing today.  Defer antibiotics or other rx at this time.  Start Tylenol PRN fever Monitor symptoms Improve hydration Follow up COVID results, if negative then can contact us next week if unresolved consider an antibiotic for strep throat coverage if  still persistent In meantime, COVID19 quarantine protocol pending test result.  Return to care as advised if worsening or new concerns.   No orders of the defined types were placed in this encounter.   Follow-up: - Return in 1 week as needed for pharyngitis/fever if unresolved - contact clinic first for lpan  Patient verbalizes understanding with the above medical recommendations including the limitation of remote medical advice.  Specific follow-up and call-back criteria were given for patient to follow-up or seek medical care more urgently if needed.   - Time spent in direct consultation with patient on phone: 9 minutes  Saralyn Pilar, DO [facility] 06/04/2019, 10:46 AM

## 2019-06-07 ENCOUNTER — Ambulatory Visit: Payer: Medicaid Other

## 2019-06-09 ENCOUNTER — Ambulatory Visit: Payer: Medicaid Other

## 2019-06-10 ENCOUNTER — Ambulatory Visit: Payer: Medicaid Other

## 2019-06-14 ENCOUNTER — Ambulatory Visit: Payer: Medicaid Other

## 2019-06-16 ENCOUNTER — Ambulatory Visit: Payer: Medicaid Other

## 2019-06-17 ENCOUNTER — Ambulatory Visit: Payer: Medicaid Other

## 2019-06-21 ENCOUNTER — Ambulatory Visit: Payer: Medicaid Other

## 2019-06-21 ENCOUNTER — Other Ambulatory Visit: Payer: Self-pay

## 2019-06-21 ENCOUNTER — Ambulatory Visit: Payer: Medicaid Other | Admitting: Adult Health

## 2019-06-21 ENCOUNTER — Encounter: Payer: Self-pay | Admitting: Adult Health

## 2019-06-21 VITALS — BP 146/106 | HR 86 | Temp 97.3°F | Ht 67.0 in | Wt 259.0 lb

## 2019-06-21 DIAGNOSIS — R760 Raised antibody titer: Secondary | ICD-10-CM

## 2019-06-21 DIAGNOSIS — I639 Cerebral infarction, unspecified: Secondary | ICD-10-CM | POA: Diagnosis not present

## 2019-06-21 DIAGNOSIS — H8190 Unspecified disorder of vestibular function, unspecified ear: Secondary | ICD-10-CM | POA: Diagnosis not present

## 2019-06-21 DIAGNOSIS — E785 Hyperlipidemia, unspecified: Secondary | ICD-10-CM

## 2019-06-21 DIAGNOSIS — E118 Type 2 diabetes mellitus with unspecified complications: Secondary | ICD-10-CM

## 2019-06-21 DIAGNOSIS — I10 Essential (primary) hypertension: Secondary | ICD-10-CM

## 2019-06-21 MED ORDER — CLOPIDOGREL BISULFATE 75 MG PO TABS: 75.0000 mg | ORAL_TABLET | Freq: Every day | ORAL | 3 refills | Status: DC

## 2019-06-21 NOTE — Patient Instructions (Addendum)
Continue clopidogrel 75 mg daily  and complete lipitor and start Crestor and Zetia once lipitor completed  for secondary stroke prevention  Discontinue aspirin at this time as no longer indicated  We will follow up in regards to neuro otologist referral  Continue to follow up with PCP/endocrinology regarding cholesterol, blood pressure and diabetes management   Repeat lab work that was done during hospitalization to rule out hypercoagulable disorder such as antiphospholipid syndrome -if levels remain elevated or indicate positive finding, will refer to hematology for further evaluation  Recommend monitoring blood pressure at home to ensure satisfactory levels  Maintain strict control of hypertension with blood pressure goal below 130/90, diabetes with hemoglobin A1c goal below 6.5% and cholesterol with LDL cholesterol (bad cholesterol) goal below 70 mg/dL. I also advised the patient to eat a healthy diet with plenty of whole grains, cereals, fruits and vegetables, exercise regularly and maintain ideal body weight.  Followup in the future with me in 4 months or call earlier if needed       Thank you for coming to see [place] at [facility]. I hope we have been able to provide you high quality care today.  You may receive a patient satisfaction survey over the next few weeks. We would appreciate your feedback and comments so that we may continue to improve ourselves and the health of our patients.

## 2019-06-21 NOTE — Progress Notes (Signed)
[facility] [address] [place]. [place] [address] [phone]       STROKE FOLLOW UP NOTE  [name] Date of Birth:  Oct 21, 1980 Medical Record Number:  [id number]   Reason for Referral: stroke follow up    CHIEF COMPLAINT:  Chief Complaint  Patient presents with  . Follow-up    hx of stroke. 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  He is accompanied by his wife.  Residual deficits of right-sided hearing impairment with tinnitus and dizziness with some improvement.  Referral placed to neuro otologist after prior visit but they have not been called to schedule evaluation.  Previously working with outpatient therapies but currently placed on hold per patient request.  Prior visit concern for underlying anxiety contributing to dizziness sensation with panic attacks and initiated short course of Xanax.  GAD-7 score remains elevated but he has not had any additional panic attacks and has only used 1 dose of Xanax.  Continues to use meclizine daily with ongoing benefit.  He will experience short episode of vertigo typically daily but only lasts for a few minutes and then resolves.  Continues on DAPT despite 23-month recommendation but denies bleeding or bruising.  Recent follow-up with endocrinology showed continued elevated LDL at 156 therefore switch to atorvastatin to Crestor 40 mg daily as well as addition of Zetia.  Continues to follow with endocrinology for DM management as well with recent A1c 6.1.  Blood pressure today 146/106, asymptomatic.  Does not routinely monitor at home.  Endorses ongoing use of his CPAP for OSA management.  During stroke admission, hypercoagulable work-up consistent with presence of lupus anticoagulant meeting criteria for antiphospholipid syndrome.  No further concerns at this  time.       History provided for reference purposes only Initial visit 04/21/2019: Joel [name] is a 39 year old male who is being seen today for stroke follow-up accompanied by his wife.  Residual stroke deficits mild right-sided weakness, dizziness, vertigo, nausea and right ear tinnitus with decreased hearing.  Currently working with physical therapy with improvement of his right side but endorses only minimal improvement of vestibular symptoms as well as symptoms waxing/waning.  Previously complaining of headaches which have since improved consisting of generalized pressure radiating down into his neck.  Underlying history of depression and does endorse worsening anxiety since his stroke.  Not currently on any medication therapy.  Continues on aspirin 325 mg daily and Plavix without bleeding or bruising.  Continues on atorvastatin without myalgias.  Blood pressure today 109/76 and does monitor at home typically 150/110-120.  Prior difficulty with blood pressure which has since improved.  Wife is not sure the accuracy of home cuff as they are using an older cuff.  Ongoing use of CPAP for OSA management.  No further concerns at this time.  Stroke admission 03/19/2019: Joel [name] is a 39 y.o. male with history of diabetes, CKD stage IIIb, hypertension, hyperlipidemia, EF 45-50%, CHF and hx of medical non compliance,  who presented on 03/19/2019 with dizziness, gait instability, loss of hearing R ear, facial numbness, nausea and vomiting. He did not receive IV t-PA due to late presentation (>4.5 hours from time of onset).  Evaluated by stroke team and Dr. Pearlean [name] with stroke work-up revealing small acute right middle cerebellar peduncle infarction due to large vessel disease  with right AICA occlusion.  MRA head also showed severe stenosis versus artifact in left PCA distal P2/proximal P3 segment, intracranial arthrosclerosis involving anterior and posterior circulation including distal right vertebral artery,  basilar artery, supraclinoid right ICA and bilateral MCA branches.  History of HTN and recommend long-term BP goal normotensive range.  LDL 211 and recommended continuation of atorvastatin 80 mg daily with medication compliance education provided.  DM type II controlled A1c 7.0.  History of cardiomyopathy/CHF with current admission EF 55 to 60% (02/2017 EF 25 to 30%) and recommended continued follow-up with cardiology.  Other stroke risk factors include prior EtOH use, morbid obesity, family history of stroke, OSA on CPAP and possible chronic stroke on imaging.  Recommended DAPT for 3 months then Plavix alone as previously on aspirin given intracranial stenosis.  Evaluated by therapies who recommended CIR but patient declined preferring to be discharged home.  Stroke: Small acute right middle cerebellar peduncle infarction due to large vessel disease with right AICA occlusion.  Resultant right hemiataxia, mild right hemiparesis, right-sided hearing loss and right face numbness  CT no acute abnormalities  MRI head W&WO - Small acute infarction involving the right middle cerebellar peduncle. Corresponding enhancement may reflect early blood brain barrier disruption. Given presence of vascular risk factors, ischemia is favored over demyelination. There is also a probable small chronic infarct near the right frontal horn.   MRA head - Possible occlusion of the right AICA, or it could be diminutive or absent. Severe stenosis versus artifact in the left PCA distal P2/proximal P3 segment. Suggestion of intracranial atherosclerosis involving the anterior and posterior circulation, including mild irregularity of the: distal right vertebral artery, basilar artery, supraclinoid right ICA, and bilateral MCA branches.   TCD bubble study - no PFO  2D Echo - EF 55 - 60%. No cardiac source of emboli identified. Severely increased left ventricular hypertrophy.   Sars Corona Virus 2 - negative  LDL - 211  HgbA1c -  7.0  Hypercoagulable labs positive lupus anticoagulant  UDS - benzodiazepine  VTE prophylaxis - Lovenox 40  aspirin 81 mg daily prior to admission, now on aspirin 325 mg daily and clopidogrel 75 mg daily. Continue DAPT for 3 months and then plavix alone given intracranial stenosis  Therapy recommendations:  CIR -> Rehab consult->Pt prefers to go home       ROS:   14 system review of systems performed and negative with exception of vertigo, tinnitus and hearing impairment  PMH:  Past Medical History:  Diagnosis Date  . Anxiety   . Chronic diastolic CHF (congestive heart failure) ([place])   . Hyperlipidemia   . Hypertension   . Hypertensive heart disease   . Noncompliance with medications   . Obesity   . Renal disorder   . Stroke ([place])   . Systolic dysfunction    a. TTE 06/2014: EF 45-50%, normal wall motion, mild MR, mild AI    PSH:  Past Surgical History:  Procedure Laterality Date  . NO PAST SURGERIES      Social History:  Social History   Socioeconomic History  . Marital status: Married    Spouse name: Not on file  . Number of children: 1  . Years of education: 74  . Highest education level: 12th grade  Occupational History  . Not on file  Tobacco Use  . Smoking status: Never Smoker  . Smokeless tobacco: Never Used  Substance and Sexual Activity  . Alcohol use: Not Currently    Comment:  Very infrequently; ~2-3 drinks/year   . Drug use: No  . Sexual activity: Yes    Birth control/protection: None  Other Topics Concern  . Not on file  Social History Narrative  . Not on file   Social Determinants of Health   Financial Resource Strain:   . Difficulty of Paying Living Expenses:   Food Insecurity:   . Worried About Programme researcher, broadcasting/film/video in the Last Year:   . Barista in the Last Year:   Transportation Needs:   . Freight forwarder (Medical):   Marland Kitchen Lack of Transportation (Non-Medical):   Physical Activity:   . Days of Exercise per Week:   .  Minutes of Exercise per Session:   Stress:   . Feeling of Stress :   Social Connections:   . Frequency of Communication with Friends and Family:   . Frequency of Social Gatherings with Friends and Family:   . Attends Religious Services:   . Active Member of Clubs or Organizations:   . Attends Banker Meetings:   Marland Kitchen Marital Status:   Intimate Partner Violence:   . Fear of Current or Ex-Partner:   . Emotionally Abused:   Marland Kitchen Physically Abused:   . Sexually Abused:     Family History:  Family History  Problem Relation Age of Onset  . Hypertension Mother   . Hyperlipidemia Mother   . Diabetes Mother   . CAD Father        a. stenting in his early 5s  . Heart disease Father   . Stroke Father   . Heart attack Father   . Diabetes Maternal Grandmother   . Diabetes Maternal Grandfather   . CAD Paternal Grandmother   . CAD Paternal Grandfather   . Colon cancer Paternal Grandfather   . Diabetes Paternal Uncle     Medications:   Current Outpatient Medications on File Prior to Visit  Medication Sig Dispense Refill  . ALPRAZolam (XANAX) 0.25 MG tablet Take 1 tablet (0.25 mg total) by mouth 2 (two) times daily as needed for anxiety. 30 tablet 0  . atorvastatin (LIPITOR) 80 MG tablet Take 1 tablet (80 mg total) by mouth daily. 90 tablet 4  . carvedilol (COREG) 6.25 MG tablet Take 1 tablet (6.25 mg total) by mouth 2 (two) times daily. 180 tablet 3  . empagliflozin (JARDIANCE) 25 MG TABS tablet Take by mouth.    . fluticasone (FLONASE) 50 MCG/ACT nasal spray Place 2 sprays into both nostrils daily. Use for 4-6 weeks then stop and use seasonally or as needed. 16 g 3  . liraglutide (VICTOZA) 18 MG/3ML SOPN Inject 1.2 mg into the skin daily.     . meclizine (ANTIVERT) 12.5 MG tablet Take 1 tablet (12.5 mg total) by mouth 3 (three) times daily as needed for dizziness. 90 tablet 2  . omeprazole (PRILOSEC) 20 MG capsule Take 20 mg by mouth daily.    Marland Kitchen spironolactone (ALDACTONE) 25 MG  tablet Take 1 tablet (25 mg total) by mouth daily. 90 tablet 3   No current facility-administered medications on file prior to visit.    Allergies:   Allergies  Allergen Reactions  . Bidil [Isosorb Dinitrate-Hydralazine] Other (See Comments)    Severe headache  . Nitroglycerin Other (See Comments)    Causes severe headaches     Physical Exam  Vitals:   06/21/19 0745  BP: (!) 146/106  Pulse: 86  Temp: (!) 97.3 F (36.3 C)  Weight: 259 lb (  117.5 kg)  Height: 5\' 7"  (1.702 m)   Body mass index is 40.57 kg/m. No exam data present  GAD 7 : Generalized Anxiety Score 06/21/2019 04/21/2019 03/03/2019 12/28/2018  Nervous, Anxious, on Edge 1 2 2  0  Control/stop worrying 3 2 3  0  Worry too much - different things 2 3 3  -  Trouble relaxing 1 2 2  0  Restless 1 1 3  0  Easily annoyed or irritable 3 2 3  0  Afraid - awful might happen 3 2 3  0  Total GAD 7 Score 14 14 19  -  Anxiety Difficulty Somewhat difficult Somewhat difficult Somewhat difficult Not difficult at all    General: well developed, well nourished, pleasant middle aged AA male, seated, in no evident distress Head: head normocephalic and atraumatic.   Neck: supple with no carotid or supraclavicular bruits Cardiovascular: regular rate and rhythm, no murmurs Musculoskeletal: no deformity Skin:  no rash/petichiae Vascular:  Normal pulses all extremities   Neurologic Exam Mental Status: Awake and fully alert. Normal speech and language. Oriented to place and time. Recent and remote memory intact. Attention span, concentration and fund of knowledge appropriate. Mood and affect mildly anxious and limited interaction due to dizziness and nausea feeling.   Cranial Nerves: Pupils equal, briskly reactive to light. Extraocular movements full with nystagmus only when looking towards left.  Visual fields full to confrontation.  Hearing decreased right ear with subjective tinnitus.  Normal hearing left ear.  Facial sensation intact.  Face, tongue, palate moves normally and symmetrically.  Motor: Normal bulk and tone. Normal strength on left side with minimal weakness right side Sensory.: intact to touch , pinprick , position and vibratory sensation.  Coordination: Rapid alternating movements normal in all extremities.  Finger-to-nose and heel-to-shin performed accurately bilaterally. Gait and Station: Arises from chair without difficulty. Stance is normal. Gait demonstrates normal stride with imbalance but ambulates without assistive device.  Difficulty with tandem walk.  Romberg positive. Reflexes: 1+ and symmetric. Toes downgoing.        ASSESSMENT: Joel [name] is a 39 y.o. year old male presented with dizziness, gait instability, loss of hearing right ear, facial numbness and nausea/vomiting on 03/19/2019 with stroke work-up revealing small acute right middle cerebellar peduncle infarction secondary to large vessel disease with right AICA occlusion. Vascular risk factors include HTN, HLD, DM, CHF, intracranial stenosis and OSA on CPAP.  Residual deficits of vertigo, hearing impairment right side and tinnitus with ongoing improvement    PLAN:  1. Right cerebellar stroke:  -Vascular vestibular/AICA syndrome, poststroke: Greatly improving but continues to experience daily vertigo episodes as well as decreased right-sided hearing.  Outpatient therapy currently placed on hold per patient request and is aware to call when interested in restarting.  Ongoing use of meclizine daily and advised to trial use of as needed basis.  Previously referred to neurootologist for further evaluation recommendations but has not been called at this time for initial evaluation -this will be looked further into.  Highly encouraged ongoing exercises at home as recommended during therapy for hopeful ongoing improvement -Anxiety: Prior anxiety worsened post stroke.  Use of Xanax as needed for panic attacks contributing to worsening vertigo episodes.   Discussion regarding benefit of SSRI or counseling but declines interest at this time.  Advise no additional Xanax prescriptions will be provided as only recommended short duration -can follow-up further with PCP if indicated. -Hypercoagulable work-up: Positive lupus anticoagulant during admission.  We will repeat lab work as it has been  greater than 12 weeks for confirmation.  If remains positive, will refer to hematology for further evaluation -Continue clopidogrel 75 mg daily  and atorvastatin for secondary stroke prevention.  Advised to discontinue aspirin and remain on Plavix alone as 53-month DAPT completed.   -Maintain strict control of hypertension with blood pressure goal below 130/90, diabetes with hemoglobin A1c goal below 6.5% and cholesterol with LDL cholesterol (bad cholesterol) goal below 70 mg/dL.  I also advised the patient to eat a healthy diet with plenty of whole grains, cereals, fruits and vegetables, exercise regularly with at least 30 minutes of continuous activity daily and maintain ideal body weight. 2. HTN: Slightly elevated at today's visit, asymptomatic.  Highly encouraged monitoring at home to ensure satisfactory levels and ongoing follow-up with PCP for monitoring management 3. HLD: Continue Crestor and Zetia as recommended by endocrinology recent LDL 156.  Continue to follow with endocrinology/PCP for ongoing monitoring and management as well as prescribing.  Would recommend consideration of PCSK9 inhibitor if remains uncontrolled 4. DMII: Stable.  Continue to follow with PCP/endocrinology for ongoing monitoring and management 5. OSA on CPAP: Ongoing compliance for secondary stroke prevention   Follow up in 4 months or call earlier if needed   I spent 35 minutes of face-to-face and non-face-to-face time with patient and wife.  This included previsit chart review, lab review, study review, order entry, electronic health record documentation, patient education regarding  recent stroke and residual deficits, importance of managing stroke risk factors and answered all questions to patient and wife satisfaction   Ihor Austin, AGNP-BC  [facility] [address] [place], [place] [address]  Phone [phone] Fax [phone] Note: This document was prepared with digital dictation and possible smart phrase technology. Any transcriptional errors that result from this process are unintentional.

## 2019-06-22 ENCOUNTER — Ambulatory Visit (INDEPENDENT_AMBULATORY_CARE_PROVIDER_SITE_OTHER): Payer: Medicaid Other | Admitting: Family Medicine

## 2019-06-22 NOTE — Progress Notes (Signed)
I agree with the above plan 

## 2019-06-23 ENCOUNTER — Telehealth: Payer: Self-pay | Admitting: *Deleted

## 2019-06-23 LAB — ANTIPHOSPHOLIPID SYNDROME EVAL, BLD
APTT PPP: 30.1 s (ref 22.9–30.2)
Anticardiolipin IgG: <9 GPL U/mL (ref 0–14)
Anticardiolipin IgM: 11 MPL U/mL (ref 0–12)
Beta-2 Glyco 1 IgM: <9 GPI IgM units (ref 0–32)
Beta-2 Glyco I IgG: <9 GPI IgG units (ref 0–20)
Dilute Viper Venom Time: 32.2 s (ref 0.0–47.0)
Hexagonal Phase Phospholipid: 0 s (ref 0–11)
INR: 1.1 (ref 0.9–1.2)
PT: 11.3 s (ref 9.1–12.0)
Thrombin Time: 20.3 s (ref 0.0–23.0)

## 2019-06-23 LAB — COAG STUDIES INTERP REPORT

## 2019-06-23 LAB — BETA-2-GLYCOPROTEIN I ABS, IGG/M/A: Beta-2 Glyco 1 IgA: <9 GPI IgA units (ref 0–25)

## 2019-06-23 NOTE — Telephone Encounter (Signed)
-----   Message from Ihor Austin, NP sent at 06/23/2019  9:13 AM EDT ----- Please advise patient that recent lab work showed all satisfactory levels and no further work-up or referrals needed at this time

## 2019-06-23 NOTE — Telephone Encounter (Signed)
Recent lab work showed all satisfactory levels and no further work-up or referrals needed at this time. This message sent thru [place] email message.

## 2019-07-06 ENCOUNTER — Ambulatory Visit (INDEPENDENT_AMBULATORY_CARE_PROVIDER_SITE_OTHER): Payer: Medicaid Other | Admitting: Family Medicine

## 2019-07-21 ENCOUNTER — Ambulatory Visit: Payer: Medicaid Other | Admitting: Family Medicine

## 2019-07-21 ENCOUNTER — Encounter: Payer: Self-pay | Admitting: Family Medicine

## 2019-07-21 ENCOUNTER — Other Ambulatory Visit: Payer: Self-pay

## 2019-07-21 VITALS — BP 155/109 | HR 102 | Temp 97.3°F | Resp 16 | Ht 67.0 in | Wt 262.6 lb

## 2019-07-21 DIAGNOSIS — S134XXA Sprain of ligaments of cervical spine, initial encounter: Secondary | ICD-10-CM

## 2019-07-21 DIAGNOSIS — M62838 Other muscle spasm: Secondary | ICD-10-CM | POA: Diagnosis not present

## 2019-07-21 DIAGNOSIS — R519 Headache, unspecified: Secondary | ICD-10-CM

## 2019-07-21 DIAGNOSIS — G589 Mononeuropathy, unspecified: Secondary | ICD-10-CM | POA: Diagnosis not present

## 2019-07-21 MED ORDER — BACLOFEN 10 MG PO TABS: 5.0000 mg | ORAL_TABLET | Freq: Three times a day (TID) | ORAL | 1 refills | Status: DC | PRN

## 2019-07-21 MED ORDER — GABAPENTIN 100 MG PO CAPS: ORAL_CAPSULE | ORAL | 0 refills | Status: DC

## 2019-07-21 NOTE — Patient Instructions (Addendum)
Thank you for coming to the office today.  1. For your Headaches / Neck pain - Overall it seems reassuring. I don't see any findings on exam that are concerning for serious head injury or neurological injury. Increase Tylenol to 1000mg  up to 3 times daily for breakthrough pain for 3-5 days then only as needed Use moist heat or heating pad on shoulders / neck, and have family member help with soft tissue massage as demonstrated  Start taking Baclofen (Lioresal) 10mg  (muscle relaxant) - start with half (cut) to one whole pill at night as needed for next 1-3 nights (may make you drowsy, caution with driving) see how it affects you, then if tolerated increase to one pill 2 to 3 times a day or (every 8 hours as needed)  If not improving on muscle relaxant OR want to just ADD this treatment for sharp nerve pinching pain Can go get and Start Gabapentin 100mg  capsules, take at night for 2-3 nights only, and then increase to 2 times a day for a few days, and then may increase to 3 times a day, it may make you drowsy, if helps significantly at night only, then you can increase instead to 3 capsules at night, instead of 3 times a day   If not improving, or worsening headache, nerve pain, numbness tingling weakness any new concerns - then can seek care more immediately at hospital ED as soon as need.  If not improving but not worsening and no new concerns, - we can consider a Head CT scan early next week - call and message and we will order ASAP.   Please schedule a Follow-up Appointment to: Return in about 1 week (around 07/28/2019), or if symptoms worsen or fail to improve, for headache nerve pain.  If you have any other questions or concerns, please feel free to call the office or send a message through MyChart. You may also schedule an earlier appointment if necessary.  Additionally, you may be receiving a survey about your experience at our office within a few days to 1 week by e-mail or mail. We value  your feedback.  , DO [facility], 07/30/2019
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
## 2019-09-03 ENCOUNTER — Telehealth: Payer: Self-pay

## 2019-09-03 NOTE — Telephone Encounter (Signed)
Confirmed appointment on 09/06/2019 and screened for covid. klh ° °

## 2019-09-06 ENCOUNTER — Other Ambulatory Visit: Payer: Self-pay

## 2019-09-06 ENCOUNTER — Encounter: Payer: Self-pay | Admitting: Internal Medicine

## 2019-09-06 ENCOUNTER — Ambulatory Visit (INDEPENDENT_AMBULATORY_CARE_PROVIDER_SITE_OTHER): Payer: Medicare Other | Admitting: Internal Medicine

## 2019-09-06 VITALS — BP 170/82 | HR 97 | Temp 97.1°F | Resp 16 | Ht 67.0 in | Wt 252.8 lb

## 2019-09-06 DIAGNOSIS — I10 Essential (primary) hypertension: Secondary | ICD-10-CM

## 2019-09-06 DIAGNOSIS — G4733 Obstructive sleep apnea (adult) (pediatric): Secondary | ICD-10-CM

## 2019-09-06 DIAGNOSIS — Z9989 Dependence on other enabling machines and devices: Secondary | ICD-10-CM | POA: Diagnosis not present

## 2019-09-06 DIAGNOSIS — I639 Cerebral infarction, unspecified: Secondary | ICD-10-CM

## 2019-09-06 NOTE — Progress Notes (Signed)
[facility] [address] [place], [place] [address]  Pulmonary Sleep Medicine   Office Visit Note  Patient Name: Joel [name] DOB: 1980-08-06 MRN [id number]  Date of Service: 09/06/2019  Complaints/HPI: Pt is here for pulmonary follow up.  He has a history of osa. Pt reports good compliance with CPAP therapy. Cleaning machine by hand, and changing filters and tubing as directed. Denies headaches, sinus issues, palpitations, or hemoptysis.  His blood pressure is slightly elevated today.  He reports this is intermittent, and he is working with his primary care doctor to alleviate this.    ROS  General: (-) fever, (-) chills, (-) night sweats, (-) weakness Skin: (-) rashes, (-) itching,. Eyes: (-) visual changes, (-) redness, (-) itching. Nose and Sinuses: (-) nasal stuffiness or itchiness, (-) postnasal drip, (-) nosebleeds, (-) sinus trouble. Mouth and Throat: (-) sore throat, (-) hoarseness. Neck: (-) swollen glands, (-) enlarged thyroid, (-) neck pain. Respiratory: - cough, (-) bloody sputum, - shortness of breath, - wheezing. Cardiovascular: - ankle swelling, (-) chest pain. Lymphatic: (-) lymph node enlargement. Neurologic: (-) numbness, (-) tingling. Psychiatric: (-) anxiety, (-) depression   Current Medication: Outpatient Encounter Medications as of 09/06/2019  Medication Sig  . ALPRAZolam (XANAX) 0.25 MG tablet Take 1 tablet (0.25 mg total) by mouth 2 (two) times daily as needed for anxiety.  Marland Kitchen atorvastatin (LIPITOR) 80 MG tablet Take 1 tablet (80 mg total) by mouth daily.  . carvedilol (COREG) 6.25 MG tablet Take 1 tablet (6.25 mg total) by mouth 2 (two) times daily.  . clopidogrel (PLAVIX) 75 MG tablet Take 1 tablet (75 mg total) by mouth daily.  . empagliflozin (JARDIANCE) 25 MG TABS tablet Take by mouth.  . ezetimibe (ZETIA) 10 MG tablet Take 10 mg by mouth daily.  . fluticasone (FLONASE) 50 MCG/ACT nasal spray Place 2 sprays into both nostrils daily. Use  for 4-6 weeks then stop and use seasonally or as needed.  . liraglutide (VICTOZA) 18 MG/3ML SOPN Inject 1.2 mg into the skin daily.   Marland Kitchen omeprazole (PRILOSEC) 20 MG capsule Take 20 mg by mouth daily.  Marland Kitchen spironolactone (ALDACTONE) 25 MG tablet Take 1 tablet (25 mg total) by mouth daily.  . baclofen (LIORESAL) 10 MG tablet Take 0.5-1 tablets (5-10 mg total) by mouth 3 (three) times daily as needed for muscle spasms. (Patient not taking: Reported on 09/06/2019)  . gabapentin (NEURONTIN) 100 MG capsule Start 1 capsule daily, increase by 1 cap every 2-3 days as tolerated up to 3 times a day, or may take 3 at once in evening. (Patient not taking: Reported on 09/06/2019)  . meclizine (ANTIVERT) 12.5 MG tablet Take 1 tablet (12.5 mg total) by mouth 3 (three) times daily as needed for dizziness. (Patient not taking: Reported on 09/06/2019)   No facility-administered encounter medications on file as of 09/06/2019.    Surgical History: Past Surgical History:  Procedure Laterality Date  . NO PAST SURGERIES      Medical History: Past Medical History:  Diagnosis Date  . Anxiety   . Chronic diastolic CHF (congestive heart failure) (HCC)   . Hyperlipidemia   . Hypertension   . Hypertensive heart disease   . Noncompliance with medications   . Obesity   . Renal disorder   . Stroke (HCC)   . Systolic dysfunction    a. TTE 06/2014: EF 45-50%, normal wall motion, mild MR, mild AI    Family History: Family History  Problem Relation Age of Onset  .  Hypertension Mother   . Hyperlipidemia Mother   . Diabetes Mother   . CAD Father        a. stenting in his early 27s  . Heart disease Father   . Stroke Father   . Heart attack Father   . Diabetes Maternal Grandmother   . Diabetes Maternal Grandfather   . CAD Paternal Grandmother   . CAD Paternal Grandfather   . Colon cancer Paternal Grandfather   . Diabetes Paternal Uncle     Social History: Social History   Socioeconomic History  . Marital  status: Married    Spouse name: Not on file  . Number of children: 1  . Years of education: 13  . Highest education level: 12th grade  Occupational History  . Not on file  Tobacco Use  . Smoking status: Never Smoker  . Smokeless tobacco: Never Used  Vaping Use  . Vaping Use: Never used  Substance and Sexual Activity  . Alcohol use: Not Currently    Comment: Very infrequently; ~2-3 drinks/year   . Drug use: No  . Sexual activity: Yes    Birth control/protection: None  Other Topics Concern  . Not on file  Social History Narrative  . Not on file   Social Determinants of Health   Financial Resource Strain:   . Difficulty of Paying Living Expenses:   Food Insecurity:   . Worried About Programme researcher, broadcasting/film/video in the Last Year:   . Barista in the Last Year:   Transportation Needs:   . Freight forwarder (Medical):   Marland Kitchen Lack of Transportation (Non-Medical):   Physical Activity:   . Days of Exercise per Week:   . Minutes of Exercise per Session:   Stress:   . Feeling of Stress :   Social Connections:   . Frequency of Communication with Friends and Family:   . Frequency of Social Gatherings with Friends and Family:   . Attends Religious Services:   . Active Member of Clubs or Organizations:   . Attends Banker Meetings:   Marland Kitchen Marital Status:   Intimate Partner Violence:   . Fear of Current or Ex-Partner:   . Emotionally Abused:   Marland Kitchen Physically Abused:   . Sexually Abused:     Vital Signs: Blood pressure (!) 170/82, pulse 97, temperature (!) 97.1 F (36.2 C), resp. rate 16, height 5\' 7"  (1.702 m), weight (!) 252 lb 12.8 oz (114.7 kg), SpO2 96 %.  Examination: General Appearance: The patient is well-developed, well-nourished, and in no distress. Skin: Gross inspection of skin unremarkable. Head: normocephalic, no gross deformities. Eyes: no gross deformities noted. ENT: ears appear grossly normal no exudates. Neck: Supple. No thyromegaly. No  LAD. Respiratory: clear bilaterally. . Cardiovascular: Normal S1 and S2 without murmur or rub. Extremities: No cyanosis. pulses are equal. Neurologic: Alert and oriented. No involuntary movements.  LABS: Recent Results (from the past 2160 hour(s))  Antiphospholipid syndrome eval, bld     Status: None   Collection Time: 06/21/19  8:39 AM  Result Value Ref Range   APTT PPP 30.1 22.9 - 30.2 sec   PT 11.3 9.1 - 12.0 sec   INR 1.1 0.9 - 1.2    Comment: Reference interval is for non-anticoagulated patients. Suggested INR therapeutic range for Vitamin K antagonist therapy:    Standard Dose (moderate intensity                   therapeutic range):  2.0 - 3.0    Higher intensity therapeutic range       2.5 - 3.5    Thrombin Time 20.3 0.0 - 23.0 sec   Dilute Viper Venom Time 32.2 0.0 - 47.0 sec   Hexagonal Phase Phospholipid 0 0 - 11 sec   Anticardiolipin IgG <9 0 - 14 GPL U/mL    Comment:                           Negative:              <15                           Indeterminate:     15 - 20                           Low-Med Positive: >20 - 80                           High Positive:         >80    Anticardiolipin IgM 11 0 - 12 MPL U/mL    Comment:                           Negative:              <13                           Indeterminate:     13 - 20                           Low-Med Positive: >20 - 80                           High Positive:         >80    Beta-2 Glyco I IgG <9 0 - 20 GPI IgG units    Comment: The reference interval reflects a 3SD or 99th percentile interval, which is thought to represent a potentially clinically significant result in accordance with the International Consensus Statement on the classification criteria for definitive antiphospholipid syndrome (APS). J Thromb Haem 2006;4:295-306.    Beta-2 Glyco 1 IgM <9 0 - 32 GPI IgM units    Comment: The reference interval reflects a 3SD or 99th percentile interval, which is thought to represent a  potentially clinically significant result in accordance with the International Consensus Statement on the classification criteria for definitive antiphospholipid syndrome (APS). J Thromb Haem 2006;4:295-306.    APS Panel Interpretation Comment     Comment: Please refer to the Coag Studies Interp Report.  Beta-2-glycoprotein i abs, IgG/M/A     Status: None   Collection Time: 06/21/19  8:39 AM  Result Value Ref Range   Beta-2 Glyco 1 IgA <9 0 - 25 GPI IgA units    Comment: The reference interval reflects a 3SD or 99th percentile interval, which is thought to represent a potentially clinically significant result in accordance with the International Consensus Statement on the classification criteria for definitive antiphospholipid syndrome (APS). J Thromb Haem 2006;4:295-306.   Coag Studies Interp Report     Status: None   Collection Time: 06/21/19  8:39 AM  Result Value Ref Range   Interpretation Note     Comment: ------------------------------- COAGULATION: MEDICAL DIRECTOR: For questions regarding panel interpretation, please contact Lebron Conners, M.D. at [facility] at [phone]. ------------------------------- DISCLAIMER These assessments and interpretations are provided as a convenience in support of the physician-patient relationship and are not intended to replace the physician's clinical judgment. They are derived from national guidelines in addition to other evidence and expert opinion. The clinician should consider this information within the context of clinical opinion and the individual patient. SEE GUIDANCE FOR ANTIPHOSPHOLIPID SYNDROME ASSESSMENT:(1) Pengo V et al. Terese Door Haemost. 2009; 7(10):1737-1740. (2) Miyakis S et al. Terese Door Haemost. 2006;4(2):295-306. (3) Felipa Furnace DA et al. Blood. [id number]): [id number].     Radiology: DG Abd 1 View  Result Date: 03/29/2019 CLINICAL DATA:  Constipation, nausea EXAM: ABDOMEN - 1 VIEW COMPARISON:   None. FINDINGS: Nonobstructive bowel gas pattern. Mild to moderate colonic stool burden. IMPRESSION: Mild to moderate colonic stool burden, suggesting mild constipation. Electronically Signed   By: Charline Bills M.D.   On: 03/29/2019 16:27    No results found.  No results found.    Assessment and Plan: Patient Active Problem List   Diagnosis Date Noted  . Morbid obesity with BMI of 40.0-44.9, adult (HCC) 04/27/2019  . Orthostatic hypotension 04/14/2019  . Acute CVA (cerebrovascular accident) (HCC) 03/19/2019  . Depression, major, recurrent, moderate (HCC) 12/19/2017  . Fatigue 04/07/2017  . Dilated cardiomyopathy (HCC) 02/23/2017  . Hypertensive heart disease with heart failure (HCC) 02/23/2017  . Essential hypertension 02/21/2017  . Type 2 diabetes mellitus with diabetic nephropathy (HCC) 02/21/2017  . H/O medication noncompliance 02/12/2017  . Chronic kidney disease (CKD), stage III (moderate) 02/12/2017  . Systolic CHF, chronic (HCC) 10/11/2014    1. OSA on CPAP Continue with cpap, pt reports good compliance.   2. Essential hypertension Elevated today, continue to follow with pcp.  3. Morbid obesity (HCC) Pt has lost 30 pounds intentionally.   Obesity Counseling: Risk Assessment: An assessment of behavioral risk factors was made today and includes lack of exercise sedentary lifestyle, lack of portion control and poor dietary habits.  Risk Modification Advice: She was counseled on portion control guidelines. Restricting daily caloric intake to 1600. The detrimental long term effects of obesity on her health and ongoing poor compliance was also discussed with the patient.    General Counseling: I have discussed the findings of the evaluation and examination with Joel [name].  I have also discussed any further diagnostic evaluation thatmay be needed or ordered today. Joel [name] verbalizes understanding of the findings of todays visit. We also reviewed his medications today and  discussed drug interactions and side effects including but not limited excessive drowsiness and altered mental states. We also discussed that there is always a risk not just to him but also people around him. he has been encouraged to call the office with any questions or concerns that should arise related to todays visit.  No orders of the defined types were placed in this encounter.    Time spent: 30 This patient was seen by Blima Ledger AGNP-C in Collaboration with Dr. Freda Munro as a part of collaborative care agreement.   I have personally obtained a history, examined the patient, evaluated laboratory and imaging results, formulated the assessment and plan and placed orders.    Yevonne Pax, MD [facility] Pulmonary and Critical Care Sleep medicine

## 2019-09-27 ENCOUNTER — Telehealth: Payer: Self-pay | Admitting: Adult Health

## 2019-09-27 MED ORDER — CLOPIDOGREL BISULFATE 75 MG PO TABS: 75.0000 mg | ORAL_TABLET | Freq: Every day | ORAL | 3 refills | Status: DC

## 2019-09-27 NOTE — Telephone Encounter (Signed)
Pt's wife, Pacen Watford (on [place]) request refill clopidogrel (PLAVIX) 75 MG tablet at [facility]. Insurance does not cover clopidogrel (PLAVIX) 75 MG tablet. Ms. Carns would like to know if there can be a preauthorization for clopidogrel (PLAVIX) 75 MG tablet or switch to something else. Ms. Mastrangelo would like a nurse to call her.

## 2019-09-27 NOTE — Telephone Encounter (Signed)
Spoke to wife and she will use [facility] for plavix [employer] risk analyst on [address].

## 2019-09-27 NOTE — Addendum Note (Signed)
Addended by: Hermenia Fiscal S on: 09/27/2019 11:05 AM   Modules accepted: Orders

## 2019-10-04 ENCOUNTER — Ambulatory Visit: Payer: Medicaid Other | Admitting: Cardiovascular Disease

## 2019-10-04 NOTE — Progress Notes (Signed)
Date:  10/06/2019   ID:  Joel [name], DOB 11-27-1980, MRN [id number]  Patient Location:  [address] [place] [address]   Provider location:   [facility], [place] office  PCP:  [name], DO  Cardiologist:  [facility]  Chief Complaint  Patient presents with  . office visit    6 monthF/U; Meds verbally reviewed with patient.    History of Present Illness:    Joel [name] is a 39 y.o. male past medical history of systolic dysfunction, nonischemic cardiomyopathy secondary to hypertensive heart disease Stress test showing no ischemia only small fixed defects attenuation artifact CKD stage III-IV,  hypertensive heart disease,  DM, followed by endocrine HLD,  medication noncompliance,  obesity,  snoring with possible undiagnosed sleep apena Hospital admission February 13, 2017 acute on chronic combined CHF hypertensive emergency. He presents for follow-up of his systolic and diastolic CHF  Weight down 30 pounds since 03/2019 Has changed his diet Cut back on his fast food  BP elevated Still 150s over 110 at home Compliant with medications  Last echocardiogram 5 or 2021 ejection fraction 55%  Denies any leg swelling, no significant shortness of breath  Lab work reviewed from endocrinology, creatinine 1.8 Potassium stable low 4 range  EKG personally reviewed by myself on todays visit Shows normal sinus rhythm rate 86 bpm poor R wave progression to the anterior precordial leads left axis deviation  Other past medical history reviewed Acute CVA 02/2019 facial numbness and dizziness and right-sided weakness  2D echo showed normal ejection fraction without cardiac source of embolism.  Seen by neurology Recommend dual antiplatelet therapy with aspirin and Plavix for 3 weeks followed by Plavix alone  MR ANGIO HEAD WO CONTRAST 03/20/2019 IMPRESSION:  1. No large vessel occlusion. Possible occlusion of the right  AICA, or it could be diminutive or absent.  2. Severe stenosis versus artifact in the left PCA distal P2/proximal P3 segment.  3. Suggestion of intracranial atherosclerosis involving the anterior and posterior circulation, including mild irregularity of the: distal right vertebral artery, basilar artery, supraclinoid right ICA, and bilateral MCA branches.  4. Non dominant left vertebral artery functionally terminates in PICA. Fetal type bilateral PCA origins.   MR ANGIO NECK   03/20/2019 Negative neck MRA. Dominant right vertebral artery.   MR BRAIN 03/19/2019 Small acute infarction involving the right middle cerebellar peduncle. Corresponding enhancement may reflect early blood brain barrier disruption. Given presence of vascular risk factors, ischemia is favored over demyelination. There is also a probable small chronic infarct near the right frontal horn.   Presented to the hospital 02/12/17/18 due to HF and HTN.  Hypertensive urgency on arrival Echo report from 02/12/17 EF of 25-30%  Aggressive diuresis, blood pressure control    Prior CV studies:   The following studies were reviewed today:  Echo 02/2017 Left ventricle: The cavity size was normal. There was moderate   concentric hypertrophy. Systolic function was severely reduced.   The estimated ejection fraction was in the range of 25% to 30%.   Diffuse hypokinesis. Regional wall motion abnormalities cannot be   excluded. Features are consistent with a pseudonormal left   ventricular filling pattern, with concomitant abnormal relaxation   and increased filling pressure (grade 2 diastolic dysfunction).   Past Medical History:  Diagnosis Date  . Anxiety   . Chronic diastolic CHF (congestive heart failure) (HCC)   . Hyperlipidemia   . Hypertension   .  Hypertensive heart disease   . Noncompliance with medications   . Obesity   . Renal disorder   . Stroke (HCC)   . Systolic dysfunction    a. TTE 06/2014: EF 45-50%, normal wall  motion, mild MR, mild AI   Past Surgical History:  Procedure Laterality Date  . NO PAST SURGERIES       Current Meds  Medication Sig  . atorvastatin (LIPITOR) 80 MG tablet Take 1 tablet (80 mg total) by mouth daily.  . carvedilol (COREG) 6.25 MG tablet Take 1 tablet (6.25 mg total) by mouth 2 (two) times daily.  . clopidogrel (PLAVIX) 75 MG tablet Take 1 tablet (75 mg total) by mouth daily.  . empagliflozin (JARDIANCE) 25 MG TABS tablet Take by mouth.  . ezetimibe (ZETIA) 10 MG tablet Take 10 mg by mouth daily.  . fluticasone (FLONASE) 50 MCG/ACT nasal spray Place 2 sprays into both nostrils daily. Use for 4-6 weeks then stop and use seasonally or as needed.  . liraglutide (VICTOZA) 18 MG/3ML SOPN Inject 1.2 mg into the skin daily.   Marland Kitchen omeprazole (PRILOSEC) 20 MG capsule Take 20 mg by mouth daily.  Marland Kitchen spironolactone (ALDACTONE) 25 MG tablet Take 1 tablet (25 mg total) by mouth daily.     Allergies:   Bidil [isosorb dinitrate-hydralazine] and Nitroglycerin , H/A  Social History   Tobacco Use  . Smoking status: Never Smoker  . Smokeless tobacco: Never Used  Vaping Use  . Vaping Use: Never used  Substance Use Topics  . Alcohol use: Not Currently    Comment: Very infrequently; ~2-3 drinks/year   . Drug use: No     Current Outpatient Medications on File Prior to Visit  Medication Sig Dispense Refill  . atorvastatin (LIPITOR) 80 MG tablet Take 1 tablet (80 mg total) by mouth daily. 90 tablet 4  . carvedilol (COREG) 6.25 MG tablet Take 1 tablet (6.25 mg total) by mouth 2 (two) times daily. 180 tablet 3  . clopidogrel (PLAVIX) 75 MG tablet Take 1 tablet (75 mg total) by mouth daily. 90 tablet 3  . empagliflozin (JARDIANCE) 25 MG TABS tablet Take by mouth.    . ezetimibe (ZETIA) 10 MG tablet Take 10 mg by mouth daily.    . fluticasone (FLONASE) 50 MCG/ACT nasal spray Place 2 sprays into both nostrils daily. Use for 4-6 weeks then stop and use seasonally or as needed. 16 g 3  .  liraglutide (VICTOZA) 18 MG/3ML SOPN Inject 1.2 mg into the skin daily.     Marland Kitchen omeprazole (PRILOSEC) 20 MG capsule Take 20 mg by mouth daily.    Marland Kitchen spironolactone (ALDACTONE) 25 MG tablet Take 1 tablet (25 mg total) by mouth daily. 90 tablet 3  . ALPRAZolam (XANAX) 0.25 MG tablet Take 1 tablet (0.25 mg total) by mouth 2 (two) times daily as needed for anxiety. 30 tablet 0  . baclofen (LIORESAL) 10 MG tablet Take 0.5-1 tablets (5-10 mg total) by mouth 3 (three) times daily as needed for muscle spasms. (Patient not taking: Reported on 09/06/2019) 30 each 1  . gabapentin (NEURONTIN) 100 MG capsule Start 1 capsule daily, increase by 1 cap every 2-3 days as tolerated up to 3 times a day, or may take 3 at once in evening. (Patient not taking: Reported on 09/06/2019) 90 capsule 0  . meclizine (ANTIVERT) 12.5 MG tablet Take 1 tablet (12.5 mg total) by mouth 3 (three) times daily as needed for dizziness. (Patient not taking: Reported on 09/06/2019) 90  tablet 2   No current facility-administered medications on file prior to visit.     Family Hx: The patient's family history includes CAD in his father, paternal grandfather, and paternal grandmother; Colon cancer in his paternal grandfather; Diabetes in his maternal grandfather, maternal grandmother, mother, and paternal uncle; Heart attack in his father; Heart disease in his father; Hyperlipidemia in his mother; Hypertension in his mother; Stroke in his father.  ROS:   Please see the history of present illness.    Review of Systems  Constitutional:       Weight loss  HENT: Negative.   Respiratory: Negative.   Cardiovascular: Negative.   Gastrointestinal: Negative.   Musculoskeletal: Negative.   Neurological: Negative.   Psychiatric/Behavioral: Negative.   All other systems reviewed and are negative.    Labs/Other Tests and Data Reviewed:    Recent Labs: 03/20/2019: ALT 17 03/21/2019: Magnesium 2.1 03/23/2019: BUN 12; Creatinine, Ser 2.16; Hemoglobin  13.6; Platelets 202; Potassium 3.6; Sodium 141   Recent Lipid Panel Lab Results  Component Value Date/Time   CHOL 264 (H) 03/20/2019 01:13 AM   CHOL 361 (H) 06/11/2014 12:32 PM   TRIG 162 (H) 03/20/2019 01:13 AM   TRIG 246 (H) 06/11/2014 12:32 PM   HDL 21 (L) 03/20/2019 01:13 AM   HDL 41 06/11/2014 12:32 PM   CHOLHDL 12.6 03/20/2019 01:13 AM   LDLCALC 211 (H) 03/20/2019 01:13 AM   LDLCALC 271 (H) 06/11/2014 12:32 PM    Wt Readings from Last 3 Encounters:  10/06/19 254 lb 2 oz (115.3 kg)  09/06/19 (!) 252 lb 12.8 oz (114.7 kg)  07/21/19 262 lb 9.6 oz (119.1 kg)     Exam:    Vital Signs: Vital signs may also be detailed in the HPI BP (!) 160/110 (BP Location: Left Arm, Patient Position: Sitting, Cuff Size: Large)   Pulse 86   Ht 5\' 7"  (1.702 m)   Wt 254 lb 2 oz (115.3 kg)   SpO2 96%   BMI 39.80 kg/m    Constitutional:  oriented to person, place, and time. No distress.  HENT:  Head: Grossly normal Eyes:  no discharge. No scleral icterus.  Neck: No JVD, no carotid bruits  Cardiovascular: Regular rate and rhythm, no murmurs appreciated Pulmonary/Chest: Clear to auscultation bilaterally, no wheezes or rails Abdominal: Soft.  no distension.  no tenderness.  Musculoskeletal: Normal range of motion Neurological:  normal muscle tone. Coordination normal. No atrophy Skin: Skin warm and dry Psychiatric: normal affect, pleasant   ASSESSMENT & PLAN:    Dilated cardiomyopathy (HCC) EF has fluctuated 45 to 50% then dropped 25 to 30% in 2019 Ejection fraction back up to normal January 2021 -Although creatinine improving we will hold off on Entresto ACE ARB Creatinine 1.8 down from 2.16 Stressed importance of aggressive blood pressure control, as detailed below  systolic CHF, chronic (HCC) -  Euvolemic Normal ejection fraction January 2021 Not much better, losing weight  Essential hypertension Medication options more limited given his renal dysfunction also intolerance to  BiDil, isosorbide Would avoid testing hydralazine -Given normal ejection fraction at this time we will start amlodipine 5 with titration up to 10 Also increase carvedilol up to 12.5 twice daily  Acute stroke/follow-up Residual right-sided deficits  cerebrovascular disease  on MRI/MRA Continue Lipitor with Plavix Losing weight aggressively  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

## 2019-10-06 ENCOUNTER — Other Ambulatory Visit: Payer: Self-pay

## 2019-10-06 ENCOUNTER — Ambulatory Visit (INDEPENDENT_AMBULATORY_CARE_PROVIDER_SITE_OTHER): Payer: Medicare Other | Admitting: Cardiovascular Disease

## 2019-10-06 ENCOUNTER — Encounter: Payer: Self-pay | Admitting: Cardiovascular Disease

## 2019-10-06 VITALS — BP 160/110 | HR 86 | Ht 67.0 in | Wt 254.1 lb

## 2019-10-06 DIAGNOSIS — E1165 Type 2 diabetes mellitus with hyperglycemia: Secondary | ICD-10-CM

## 2019-10-06 DIAGNOSIS — I110 Hypertensive heart disease with heart failure: Secondary | ICD-10-CM

## 2019-10-06 DIAGNOSIS — IMO0002 Reserved for concepts with insufficient information to code with codable children: Secondary | ICD-10-CM

## 2019-10-06 DIAGNOSIS — N1831 Chronic kidney disease, stage 3a: Secondary | ICD-10-CM

## 2019-10-06 DIAGNOSIS — I10 Essential (primary) hypertension: Secondary | ICD-10-CM | POA: Diagnosis not present

## 2019-10-06 DIAGNOSIS — I5022 Chronic systolic (congestive) heart failure: Secondary | ICD-10-CM | POA: Diagnosis not present

## 2019-10-06 DIAGNOSIS — E1021 Type 1 diabetes mellitus with diabetic nephropathy: Secondary | ICD-10-CM

## 2019-10-06 DIAGNOSIS — I639 Cerebral infarction, unspecified: Secondary | ICD-10-CM | POA: Diagnosis not present

## 2019-10-06 DIAGNOSIS — E1121 Type 2 diabetes mellitus with diabetic nephropathy: Secondary | ICD-10-CM

## 2019-10-06 DIAGNOSIS — I420 Dilated cardiomyopathy: Secondary | ICD-10-CM

## 2019-10-06 MED ORDER — CARVEDILOL 12.5 MG PO TABS: 12.5000 mg | ORAL_TABLET | Freq: Two times a day (BID) | ORAL | 3 refills | Status: DC

## 2019-10-06 MED ORDER — AMLODIPINE BESYLATE 10 MG PO TABS: 10.0000 mg | ORAL_TABLET | Freq: Every day | ORAL | 3 refills | Status: DC

## 2019-10-06 NOTE — Patient Instructions (Addendum)
  Medication Instructions:  Start first: Please start amlodipine 10 mg daily (1/2 pill for the first few days, then up to full pill)  Then after 1 1/2 wks from today , (around sept 5th)  then increase the coreg up to 12.5 mg twice a day  If you need a refill on your cardiac medications before your next appointment, please call your pharmacy.    Lab work: No labs today   If you have labs (blood work) drawn today and your tests are completely normal, you will receive your results only by: Marland Kitchen MyChart Message (if you have MyChart) OR . A paper copy in the mail If you have any lab test that is abnormal or we need to change your treatment, we will call you to review the results.   Testing/Procedures: No new testing needed   Follow-Up: At [facility], you and your health needs are our priority.  As part of our continuing mission to provide you with exceptional heart care, we have created designated Provider Care Teams.  These Care Teams include your primary Cardiologist (physician) and Advanced Practice Providers (APPs -  Physician Assistants and Nurse Practitioners) who all work together to provide you with the care you need, when you need it.  . You will need a follow up appointment in 6 months  . Providers on your designated Care Team:   . Nicolasa Ducking, NP . Eula Listen, PA-C . Marisue Ivan, PA-C  Any Other Special Instructions Will Be Listed Below (If Applicable).  COVID-19 Vaccine Information can be found at: [email or web address] For questions related to vaccine distribution or appointments, please email [email or web address] or call [phone].

## 2019-10-26 ENCOUNTER — Encounter: Payer: Self-pay | Admitting: Adult Health

## 2019-10-26 ENCOUNTER — Ambulatory Visit (INDEPENDENT_AMBULATORY_CARE_PROVIDER_SITE_OTHER): Payer: Medicare Other | Admitting: Adult Health

## 2019-10-26 ENCOUNTER — Other Ambulatory Visit: Payer: Self-pay

## 2019-10-26 VITALS — BP 134/88 | HR 84 | Ht 67.0 in | Wt 257.0 lb

## 2019-10-26 DIAGNOSIS — E785 Hyperlipidemia, unspecified: Secondary | ICD-10-CM | POA: Diagnosis not present

## 2019-10-26 DIAGNOSIS — I639 Cerebral infarction, unspecified: Secondary | ICD-10-CM

## 2019-10-26 DIAGNOSIS — H9041 Sensorineural hearing loss, unilateral, right ear, with unrestricted hearing on the contralateral side: Secondary | ICD-10-CM

## 2019-10-26 DIAGNOSIS — I10 Essential (primary) hypertension: Secondary | ICD-10-CM | POA: Diagnosis not present

## 2019-10-26 DIAGNOSIS — E118 Type 2 diabetes mellitus with unspecified complications: Secondary | ICD-10-CM | POA: Diagnosis not present

## 2019-10-26 NOTE — Progress Notes (Signed)
[facility] [address] [place]. [place] [address] [phone]       STROKE FOLLOW UP NOTE  [name] Date of Birth:  [birth year] Medical Record Number:  [id number]   Reason for Referral: stroke follow up    CHIEF COMPLAINT:  Chief Complaint  Patient presents with  . Follow-up    4 month f/u, states he is still having problems with facial paralysis and hearing loss in the right ear.   . treatment room    with wife    HPI:   Today, 10/26/2019, Joel [name] returns for stroke follow-up  Patient reports residual stroke deficits tinnitus and hearing deficit. He does report with improvement of hearing, the tinnitus worsens. Vertigo improving but can worsen with increased exertion. Quick episode 2-3 seconds and then resolves.  Denies residual anxiety difficulty Denies new stroke/TIA symptoms  Remains on Plavix without bleeding or bruising Remains on Crestor 40 mg daily and Zetia without side effects Blood pressure today 134/88 Glucose levels not routinely monitored at home - typically stable when he does check Reports ongoing use of CPAP for OSA management  During hospital admission, lab work showed concern for antiphospholipid syndrome.  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  He is accompanied by his wife.  Residual deficits of right-sided hearing impairment with tinnitus and dizziness with some improvement.  Referral placed to neuro otologist after prior visit but they have not been called to schedule evaluation.  Previously working with outpatient therapies but currently placed on hold per patient request.  Prior visit concern for underlying anxiety contributing to dizziness sensation with panic  attacks and initiated short course of Xanax.  GAD-7 score remains elevated but he has not had any additional panic attacks and has only used 1 dose of Xanax.  Continues to use meclizine daily with ongoing benefit.  He will experience short episode of vertigo typically daily but only lasts for a few minutes and then resolves.  Continues on DAPT despite 66-month recommendation but denies bleeding or bruising.  Recent follow-up with endocrinology showed continued elevated LDL at 156 therefore switch to atorvastatin to Crestor 40 mg daily as well as addition of Zetia.  Continues to follow with endocrinology for DM management as well with recent A1c 6.1.  Blood pressure today 146/106, asymptomatic.  Does not routinely monitor at home.  Endorses ongoing use of his CPAP for OSA management.  During stroke admission, hypercoagulable work-up consistent with presence of lupus anticoagulant meeting criteria for antiphospholipid syndrome.  No further concerns at this time.  Initial visit 04/21/2019: Joel [name] is a 39 year old male who is being seen today for stroke follow-up accompanied by his wife.  Residual stroke deficits mild right-sided weakness, dizziness, vertigo, nausea and right ear tinnitus with decreased hearing.  Currently working with physical therapy with improvement of his right side but endorses only minimal improvement of vestibular symptoms as well as symptoms waxing/waning.  Previously complaining of headaches which have since improved consisting of generalized pressure radiating down into his neck.  Underlying history of depression and does endorse worsening anxiety since his stroke.  Not currently on any medication therapy.  Continues on aspirin 325 mg daily and Plavix without bleeding or bruising.  Continues on  atorvastatin without myalgias.  Blood pressure today 109/76 and does monitor at home typically 150/110-120.  Prior difficulty with blood pressure which has since improved.  Wife is not sure the  accuracy of home cuff as they are using an older cuff.  Ongoing use of CPAP for OSA management.  No further concerns at this time.  Stroke admission 03/19/2019: Joel [name] is a 39 y.o. male with history of diabetes, CKD stage IIIb, hypertension, hyperlipidemia, EF 45-50%, CHF and hx of medical non compliance,  who presented on 03/19/2019 with dizziness, gait instability, loss of hearing R ear, facial numbness, nausea and vomiting. He did not receive IV t-PA due to late presentation (>4.5 hours from time of onset).  Evaluated by stroke team and Dr. 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  MRA head also showed severe stenosis versus artifact in left PCA distal P2/proximal P3 segment, intracranial arthrosclerosis involving anterior and posterior circulation including distal right vertebral artery, basilar artery, supraclinoid right ICA and bilateral MCA branches.  History of HTN and recommend long-term BP goal normotensive range.  LDL 211 and recommended continuation of atorvastatin 80 mg daily with medication compliance education provided.  DM type II controlled A1c 7.0.  History of cardiomyopathy/CHF with current admission EF 55 to 60% (02/2017 EF 25 to 30%) and recommended continued follow-up with cardiology.  Other stroke risk factors include prior EtOH use, morbid obesity, family history of stroke, OSA on CPAP and possible chronic stroke on imaging.  Recommended DAPT for 3 months then Plavix alone as previously on aspirin given intracranial stenosis.  Evaluated by therapies who recommended CIR but patient declined preferring to be discharged home.  Stroke: Small acute right middle cerebellar peduncle infarction due to large vessel disease with right AICA occlusion.  Resultant right hemiataxia, mild right hemiparesis, right-sided hearing loss and right face numbness  CT no acute abnormalities  MRI  head W&WO - Small acute infarction involving the right middle cerebellar peduncle. Corresponding enhancement may reflect early blood brain barrier disruption. Given presence of vascular risk factors, ischemia is favored over demyelination. There is also a probable small chronic infarct near the right frontal horn.   MRA head - Possible occlusion of the right AICA, or it could be diminutive or absent. Severe stenosis versus artifact in the left PCA distal P2/proximal P3 segment. Suggestion of intracranial atherosclerosis involving the anterior and posterior circulation, including mild irregularity of the: distal right vertebral artery, basilar artery, supraclinoid right ICA, and bilateral MCA branches.   TCD bubble study - no PFO  2D Echo - EF 55 - 60%. No cardiac source of emboli identified. Severely increased left ventricular hypertrophy.   Sars Corona Virus 2 - negative  LDL - 211  HgbA1c - 7.0  Hypercoagulable labs positive lupus anticoagulant  UDS - benzodiazepine  VTE prophylaxis - Lovenox 40  aspirin 81 mg daily prior to admission, now on aspirin 325 mg daily and clopidogrel 75 mg daily. Continue DAPT for 3 months and then plavix alone given intracranial stenosis  Therapy recommendations:  CIR -> Rehab consult->Pt prefers to go home       ROS:   14 system review of systems performed and negative with exception of vertigo, tinnitus and hearing impairment  PMH:  Past Medical History:  Diagnosis Date  . Anxiety   . Chronic diastolic CHF (congestive heart failure) (HCC)   . Hyperlipidemia   . Hypertension   .  Hypertensive heart disease   . Noncompliance with medications   . Obesity   . Renal disorder   . Stroke (HCC)   . Systolic dysfunction    a. TTE 06/2014: EF 45-50%, normal wall motion, mild MR, mild AI    PSH:  Past Surgical History:  Procedure Laterality Date  . NO PAST SURGERIES      Social History:  Social History   Socioeconomic History  . Marital  status: Married    Spouse name: Not on file  . Number of children: 1  . Years of education: 15  . Highest education level: 12th grade  Occupational History  . Not on file  Tobacco Use  . Smoking status: Never Smoker  . Smokeless tobacco: Never Used  Vaping Use  . Vaping Use: Never used  Substance and Sexual Activity  . Alcohol use: Not Currently    Comment: Very infrequently; ~2-3 drinks/year   . Drug use: No  . Sexual activity: Yes    Birth control/protection: None  Other Topics Concern  . Not on file  Social History Narrative  . Not on file   Social Determinants of Health   Financial Resource Strain:   . Difficulty of Paying Living Expenses: Not on file  Food Insecurity:   . Worried About Programme researcher, broadcasting/film/video in the Last Year: Not on file  . Ran Out of Food in the Last Year: Not on file  Transportation Needs:   . Lack of Transportation (Medical): Not on file  . Lack of Transportation (Non-Medical): Not on file  Physical Activity:   . Days of Exercise per Week: Not on file  . Minutes of Exercise per Session: Not on file  Stress:   . Feeling of Stress : Not on file  Social Connections:   . Frequency of Communication with Friends and Family: Not on file  . Frequency of Social Gatherings with Friends and Family: Not on file  . Attends Religious Services: Not on file  . Active Member of Clubs or Organizations: Not on file  . Attends Banker Meetings: Not on file  . Marital Status: Not on file  Intimate Partner Violence:   . Fear of Current or Ex-Partner: Not on file  . Emotionally Abused: Not on file  . Physically Abused: Not on file  . Sexually Abused: Not on file    Family History:  Family History  Problem Relation Age of Onset  . Hypertension Mother   . Hyperlipidemia Mother   . Diabetes Mother   . CAD Father        a. stenting in his early 52s  . Heart disease Father   . Stroke Father   . Heart attack Father   . Diabetes Maternal Grandmother    . Diabetes Maternal Grandfather   . CAD Paternal Grandmother   . CAD Paternal Grandfather   . Colon cancer Paternal Grandfather   . Diabetes Paternal Uncle     Medications:   Current Outpatient Medications on File Prior to Visit  Medication Sig Dispense Refill  . amLODipine (NORVASC) 10 MG tablet Take 1 tablet (10 mg total) by mouth daily. 180 tablet 3  . atorvastatin (LIPITOR) 80 MG tablet Take 1 tablet (80 mg total) by mouth daily. 90 tablet 4  . carvedilol (COREG) 12.5 MG tablet Take 1 tablet (12.5 mg total) by mouth 2 (two) times daily. 180 tablet 3  . clopidogrel (PLAVIX) 75 MG tablet Take 1 tablet (75 mg total) by  mouth daily. 90 tablet 3  . empagliflozin (JARDIANCE) 25 MG TABS tablet Take by mouth.    . ezetimibe (ZETIA) 10 MG tablet Take 10 mg by mouth daily.    Marland Kitchen liraglutide (VICTOZA) 18 MG/3ML SOPN Inject 1.2 mg into the skin daily.     Marland Kitchen omeprazole (PRILOSEC) 20 MG capsule Take 20 mg by mouth daily.    Marland Kitchen spironolactone (ALDACTONE) 25 MG tablet Take 1 tablet (25 mg total) by mouth daily. 90 tablet 3   No current facility-administered medications on file prior to visit.    Allergies:   Allergies  Allergen Reactions  . Bidil [Isosorb Dinitrate-Hydralazine] Other (See Comments)    Severe headache  . Nitroglycerin Other (See Comments)    Causes severe headaches     Physical Exam  Vitals:   10/26/19 0859  BP: 134/88  Pulse: 84  Weight: 257 lb (116.6 kg)  Height: 5\' 7"  (1.702 m)   Body mass index is 40.25 kg/m. No exam data present   General: well developed, well nourished, pleasant middle aged AA male, seated, in no evident distress Head: head normocephalic and atraumatic.   Neck: supple with no carotid or supraclavicular bruits Cardiovascular: regular rate and rhythm, no murmurs Musculoskeletal: no deformity Skin:  no rash/petichiae Vascular:  Normal pulses all extremities   Neurologic Exam Mental Status: Awake and fully alert. Normal speech and  language. Oriented to place and time. Recent and remote memory intact. Attention span, concentration and fund of knowledge appropriate. Mood and affect appropriate Cranial Nerves: Pupils equal, briskly reactive to light. Extraocular movements full with nystagmus only when looking towards left.  Visual fields full to confrontation.  Positive Rinne test (AC>BC bilaterally).  Weber's test heard in good ear.  Facial sensation intact. Face, tongue, palate moves normally and symmetrically.  Motor: Normal bulk and tone.  Normal strength in all tested extremities Sensory.: intact to touch , pinprick , position and vibratory sensation.  Coordination: Rapid alternating movements normal in all extremities.  Finger-to-nose and heel-to-shin performed accurately bilaterally. Gait and Station: Arises from chair without difficulty. Stance is normal. Gait demonstrates normal stride with imbalance but ambulates without assistive device.  Difficulty with tandem walk.  Romberg positive. Reflexes: 1+ and symmetric. Toes downgoing.        ASSESSMENT: Joel [name] is a 39 y.o. year old male presented with dizziness, gait instability, loss of hearing right ear, facial numbness and nausea/vomiting on 03/19/2019 with stroke work-up revealing small acute right middle cerebellar peduncle infarction secondary to large vessel disease with right AICA occlusion. Vascular risk factors include HTN, HLD, DM, CHF, intracranial stenosis and OSA on CPAP.    Residual deficits of vertigo, hearing impairment right side and tinnitus with ongoing improvement    PLAN:  1. Right cerebellar stroke:  a. Residual deficits: Occasional vertigo, imbalance, right-sided hearing impairment and tinnitus.  Overall great improvement but continues to experience deficits.  Hearing and tinnitus are of his greatest concern.  Referral placed to ENT for further evaluation b. Repeat hypercoagulable labs negative on repeat testing c. Continue clopidogrel 75  mg daily  and atorvastatin for secondary stroke prevention.  d. Discussed importance of close PCP follow-up for aggressive stroke risk factor management 2. HTN: BP goal<130/90.  Stable.  Remains on spironolactone, carvedilol and amlodipine.  Managed by PCP. 3. HLD: LDL goal <70.  Continue Crestor and Zetia as recommended by endocrinology.  Continue to follow with endocrinology/PCP for ongoing monitoring and management as well as prescribing.  4. DMII: A1c goal <7.0.  Stable.  Continue to follow with PCP/endocrinology for ongoing monitoring and management 5. OSA on CPAP: Ongoing compliance for secondary stroke prevention   Follow up in 6 months or call earlier if needed   I spent 30 minutes of face-to-face and non-face-to-face time with patient and wife.  This included previsit chart review, lab review, study review, order entry, electronic health record documentation, patient education regarding stroke and residual deficits, importance of managing stroke risk factors and answered all questions to patient and wife satisfaction   Ihor Austin, AGNP-BC  [facility] [address] [place], [place] [address]  Phone [phone] Fax [phone] Note: This document was prepared with digital dictation and possible smart phrase technology. Any transcriptional errors that result from this process are unintentional.

## 2019-10-26 NOTE — Patient Instructions (Addendum)
Your Plan:  Continue current treatment plan with plavix and Crestor  Continue to follow with your PCP for blood pressure and cholesterol management  Continue to follow with endocrinology for diabetic management  Please call on Monday if you do not hear from ENT specialist for further evaluation    Follow up in 6 months or call earlier if needed     Thank you for coming to see [place] at [facility]. I hope we have been able to provide you high quality care today.  You may receive a patient satisfaction survey over the next few weeks. We would appreciate your feedback and comments so that we may continue to improve ourselves and the health of our patients.

## 2019-10-27 NOTE — Progress Notes (Signed)
I agree with the above plan 

## 2019-11-03 ENCOUNTER — Encounter: Payer: Self-pay | Admitting: Adult Health

## 2019-11-22 ENCOUNTER — Other Ambulatory Visit: Payer: Self-pay

## 2019-11-22 ENCOUNTER — Ambulatory Visit (INDEPENDENT_AMBULATORY_CARE_PROVIDER_SITE_OTHER): Payer: Medicare Other | Admitting: Otolaryngology

## 2019-11-22 DIAGNOSIS — I639 Cerebral infarction, unspecified: Secondary | ICD-10-CM | POA: Diagnosis not present

## 2019-11-22 DIAGNOSIS — H9311 Tinnitus, right ear: Secondary | ICD-10-CM | POA: Diagnosis not present

## 2019-11-22 DIAGNOSIS — H9041 Sensorineural hearing loss, unilateral, right ear, with unrestricted hearing on the contralateral side: Secondary | ICD-10-CM

## 2019-11-22 NOTE — Progress Notes (Signed)
HPI: Joel [name] is a 39 y.o. male who presents is referred by his PCP for evaluation of right ear hearing loss and treatment for hearing loss.  Patient apparently lost his hearing about 10 months ago.  He had an MRI scan performed because of hearing loss and dizziness and on review of the MRI scan he had findings consistent with probable stroke of the right MCA.  He has had no further evaluation and recommendations concerning his right ear hearing loss.  He also complains of ringing in the right ear.  Past Medical History:  Diagnosis Date  . Anxiety   . Chronic diastolic CHF (congestive heart failure) (HCC)   . Hyperlipidemia   . Hypertension   . Hypertensive heart disease   . Noncompliance with medications   . Obesity   . Renal disorder   . Stroke (HCC)   . Systolic dysfunction    a. TTE 06/2014: EF 45-50%, normal wall motion, mild MR, mild AI   Past Surgical History:  Procedure Laterality Date  . NO PAST SURGERIES     Social History   Socioeconomic History  . Marital status: Married    Spouse name: Not on file  . Number of children: 1  . Years of education: 32  . Highest education level: 12th grade  Occupational History  . Not on file  Tobacco Use  . Smoking status: Never Smoker  . Smokeless tobacco: Never Used  Vaping Use  . Vaping Use: Never used  Substance and Sexual Activity  . Alcohol use: Not Currently    Comment: Very infrequently; ~2-3 drinks/year   . Drug use: No  . Sexual activity: Yes    Birth control/protection: None  Other Topics Concern  . Not on file  Social History Narrative  . Not on file   Social Determinants of Health   Financial Resource Strain:   . Difficulty of Paying Living Expenses: Not on file  Food Insecurity:   . Worried About Programme researcher, broadcasting/film/video in the Last Year: Not on file  . Ran Out of Food in the Last Year: Not on file  Transportation Needs:   . Lack of Transportation (Medical): Not on file  . Lack of Transportation  (Non-Medical): Not on file  Physical Activity:   . Days of Exercise per Week: Not on file  . Minutes of Exercise per Session: Not on file  Stress:   . Feeling of Stress : Not on file  Social Connections:   . Frequency of Communication with Friends and Family: Not on file  . Frequency of Social Gatherings with Friends and Family: Not on file  . Attends Religious Services: Not on file  . Active Member of Clubs or Organizations: Not on file  . Attends Banker Meetings: Not on file  . Marital Status: Not on file   Family History  Problem Relation Age of Onset  . Hypertension Mother   . Hyperlipidemia Mother   . Diabetes Mother   . CAD Father        a. stenting in his early 31s  . Heart disease Father   . Stroke Father   . Heart attack Father   . Diabetes Maternal Grandmother   . Diabetes Maternal Grandfather   . CAD Paternal Grandmother   . CAD Paternal Grandfather   . Colon cancer Paternal Grandfather   . Diabetes Paternal Uncle    Allergies  Allergen Reactions  . Bidil [Isosorb Dinitrate-Hydralazine] Other (See Comments)  Severe headache  . Nitroglycerin Other (See Comments)    Causes severe headaches   Prior to Admission medications   Medication Sig Start Date End Date Taking? Authorizing Provider  amLODipine (NORVASC) 10 MG tablet Take 1 tablet (10 mg total) by mouth daily. 10/06/19 01/04/20  Antonieta Iba, MD  atorvastatin (LIPITOR) 80 MG tablet Take 1 tablet (80 mg total) by mouth daily. 03/31/19   Antonieta Iba, MD  carvedilol (COREG) 12.5 MG tablet Take 1 tablet (12.5 mg total) by mouth 2 (two) times daily. 10/06/19 01/04/20  Antonieta Iba, MD  clopidogrel (PLAVIX) 75 MG tablet Take 1 tablet (75 mg total) by mouth daily. 09/27/19   Ihor Austin, NP  empagliflozin (JARDIANCE) 25 MG TABS tablet Take by mouth. 05/25/19   [provider]  ezetimibe (ZETIA) 10 MG tablet Take 10 mg by mouth daily.    [provider]  liraglutide  (VICTOZA) 18 MG/3ML SOPN Inject 1.2 mg into the skin daily.  11/04/18 11/04/19  [provider]  omeprazole (PRILOSEC) 20 MG capsule Take 20 mg by mouth daily.    [provider]  spironolactone (ALDACTONE) 25 MG tablet Take 1 tablet (25 mg total) by mouth daily. 04/08/19   Antonieta Iba, MD     Positive ROS: Otherwise negative  All other systems have been reviewed and were otherwise negative with the exception of those mentioned in the HPI and as above.  Physical Exam: Constitutional: Alert, well-appearing, no acute distress Ears: External ears without lesions or tenderness. Ear canals are clear bilaterally with intact, clear TMs are clear bilaterally with good mobility on pneumatic otoscopy. Nasal: External nose without lesions.. Clear nasal passages Oral: Lips and gums without lesions. Tongue and palate mucosa without lesions. Posterior oropharynx clear. Neck: No palpable adenopathy or masses Respiratory: Breathing comfortably  Skin: No facial/neck lesions or rash noted.  Audiogram demonstrated normal hearing on the left side with SRT of 10 dB.  Right ear showed moderate severe sensorineural hearing loss with SRT of 45 dB.  He had type A tympanograms bilaterally.  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  Plan: He would be a candidate for hearing aid.  Discussed this with Marlee and his wife.  They can follow-up with our audiologist or another audiologist concerning obtaining hearing aid for the right ear.   Narda Bonds, MD   CC:

## 2019-11-30 ENCOUNTER — Encounter (INDEPENDENT_AMBULATORY_CARE_PROVIDER_SITE_OTHER): Payer: Self-pay

## 2019-12-06 ENCOUNTER — Ambulatory Visit (INDEPENDENT_AMBULATORY_CARE_PROVIDER_SITE_OTHER): Payer: Medicare Other | Admitting: Internal Medicine

## 2019-12-06 ENCOUNTER — Other Ambulatory Visit: Payer: Self-pay

## 2019-12-06 ENCOUNTER — Encounter: Payer: Self-pay | Admitting: Internal Medicine

## 2019-12-06 VITALS — BP 152/104 | HR 96 | Temp 98.3°F | Resp 16 | Ht 67.0 in | Wt 251.4 lb

## 2019-12-06 DIAGNOSIS — G4733 Obstructive sleep apnea (adult) (pediatric): Secondary | ICD-10-CM

## 2019-12-06 DIAGNOSIS — G478 Other sleep disorders: Secondary | ICD-10-CM

## 2019-12-06 DIAGNOSIS — I5022 Chronic systolic (congestive) heart failure: Secondary | ICD-10-CM

## 2019-12-06 DIAGNOSIS — I639 Cerebral infarction, unspecified: Secondary | ICD-10-CM

## 2019-12-06 DIAGNOSIS — Z9989 Dependence on other enabling machines and devices: Secondary | ICD-10-CM

## 2019-12-06 NOTE — Progress Notes (Signed)
[facility] [address] [place], [place] [address]  Pulmonary Sleep Medicine   Office Visit Note  Patient Name: Joel [name] DOB: [birth year] MRN [id number]  Date of Service: 12/06/2019  Complaints/HPI: Overall he is doing well. He has had a couple of episodes of sleep paralysis according to him. His wife states that he is not snoring. He is however having some issues with napping in the daytime. He states that while he is napping he states that he does have vivd dreams. Patient states this happens everytime he falls asleep. Patient states he has blank stare episodes also. He has no cataplexy. He has lost 100 pounds since the last year Patient states he has had a stroke a while back.   ROS  General: (-) fever, (-) chills, (-) night sweats, (-) weakness Skin: (-) rashes, (-) itching,. Eyes: (-) visual changes, (-) redness, (-) itching. Nose and Sinuses: (-) nasal stuffiness or itchiness, (-) postnasal drip, (-) nosebleeds, (-) sinus trouble. Mouth and Throat: (-) sore throat, (-) hoarseness. Neck: (-) swollen glands, (-) enlarged thyroid, (-) neck pain. Respiratory: - cough, (-) bloody sputum, - shortness of breath, - wheezing. Cardiovascular: - ankle swelling, (-) chest pain. Lymphatic: (-) lymph node enlargement. Neurologic: (-) numbness, (-) tingling. Psychiatric: (-) anxiety, (-) depression   Current Medication: Outpatient Encounter Medications as of 12/06/2019  Medication Sig  . amLODipine (NORVASC) 10 MG tablet Take 1 tablet (10 mg total) by mouth daily.  Marland Kitchen atorvastatin (LIPITOR) 80 MG tablet Take 1 tablet (80 mg total) by mouth daily.  . carvedilol (COREG) 12.5 MG tablet Take 1 tablet (12.5 mg total) by mouth 2 (two) times daily.  . clopidogrel (PLAVIX) 75 MG tablet Take 1 tablet (75 mg total) by mouth daily.  . empagliflozin (JARDIANCE) 25 MG TABS tablet Take by mouth.  . ezetimibe (ZETIA) 10 MG tablet Take 10 mg by mouth daily.  Marland Kitchen omeprazole (PRILOSEC) 20  MG capsule Take 20 mg by mouth daily.  Marland Kitchen spironolactone (ALDACTONE) 25 MG tablet Take 1 tablet (25 mg total) by mouth daily.  Marland Kitchen liraglutide (VICTOZA) 18 MG/3ML SOPN Inject 1.2 mg into the skin daily.    No facility-administered encounter medications on file as of 12/06/2019.    Surgical History: Past Surgical History:  Procedure Laterality Date  . NO PAST SURGERIES      Medical History: Past Medical History:  Diagnosis Date  . Anxiety   . Chronic diastolic CHF (congestive heart failure) (HCC)   . Hyperlipidemia   . Hypertension   . Hypertensive heart disease   . Noncompliance with medications   . Obesity   . Renal disorder   . Stroke (HCC)   . Systolic dysfunction    a. TTE 06/2014: EF 45-50%, normal wall motion, mild MR, mild AI    Family History: Family History  Problem Relation Age of Onset  . Hypertension Mother   . Hyperlipidemia Mother   . Diabetes Mother   . CAD Father        a. stenting in his early 21s  . Heart disease Father   . Stroke Father   . Heart attack Father   . Diabetes Maternal Grandmother   . Diabetes Maternal Grandfather   . CAD Paternal Grandmother   . CAD Paternal Grandfather   . Colon cancer Paternal Grandfather   . Diabetes Paternal Uncle     Social History: Social History   Socioeconomic History  . Marital status: Married    Spouse name: Not  on file  . Number of children: 1  . Years of education: 74  . Highest education level: 12th grade  Occupational History  . Not on file  Tobacco Use  . Smoking status: Never Smoker  . Smokeless tobacco: Never Used  Vaping Use  . Vaping Use: Never used  Substance and Sexual Activity  . Alcohol use: Not Currently    Comment: Very infrequently; ~2-3 drinks/year   . Drug use: No  . Sexual activity: Yes    Birth control/protection: None  Other Topics Concern  . Not on file  Social History Narrative  . Not on file   Social Determinants of Health   Financial Resource Strain:   .  Difficulty of Paying Living Expenses: Not on file  Food Insecurity:   . Worried About Programme researcher, broadcasting/film/video in the Last Year: Not on file  . Ran Out of Food in the Last Year: Not on file  Transportation Needs:   . Lack of Transportation (Medical): Not on file  . Lack of Transportation (Non-Medical): Not on file  Physical Activity:   . Days of Exercise per Week: Not on file  . Minutes of Exercise per Session: Not on file  Stress:   . Feeling of Stress : Not on file  Social Connections:   . Frequency of Communication with Friends and Family: Not on file  . Frequency of Social Gatherings with Friends and Family: Not on file  . Attends Religious Services: Not on file  . Active Member of Clubs or Organizations: Not on file  . Attends Banker Meetings: Not on file  . Marital Status: Not on file  Intimate Partner Violence:   . Fear of Current or Ex-Partner: Not on file  . Emotionally Abused: Not on file  . Physically Abused: Not on file  . Sexually Abused: Not on file    Vital Signs: Blood pressure (!) 152/104, pulse 96, temperature 98.3 F (36.8 C), resp. rate 16, height 5\' 7"  (1.702 m), weight 251 lb 6.4 oz (114 kg), SpO2 97 %.  Examination: General Appearance: The patient is well-developed, well-nourished, and in no distress. Skin: Gross inspection of skin unremarkable. Head: normocephalic, no gross deformities. Eyes: no gross deformities noted. ENT: ears appear grossly normal no exudates. Neck: Supple. No thyromegaly. No LAD. Respiratory: no rhonchi noted. Cardiovascular: Normal S1 and S2 without murmur or rub. Extremities: No cyanosis. pulses are equal. Neurologic: Alert and oriented. No involuntary movements.  LABS: No results found for this or any previous visit (from the past 2160 hour(s)).  Radiology: DG Abd 1 View  Result Date: 03/29/2019 CLINICAL DATA:  Constipation, nausea EXAM: ABDOMEN - 1 VIEW COMPARISON:  None. FINDINGS: Nonobstructive bowel gas  pattern. Mild to moderate colonic stool burden. IMPRESSION: Mild to moderate colonic stool burden, suggesting mild constipation. Electronically Signed   By: 03/31/2019 M.D.   On: 03/29/2019 16:27    No results found.  No results found.    Assessment and Plan: Patient Active Problem List   Diagnosis Date Noted  . Morbid obesity with BMI of 40.0-44.9, adult (HCC) 04/27/2019  . Orthostatic hypotension 04/14/2019  . Acute CVA (cerebrovascular accident) (HCC) 03/19/2019  . Depression, major, recurrent, moderate (HCC) 12/19/2017  . Fatigue 04/07/2017  . Dilated cardiomyopathy (HCC) 02/23/2017  . Hypertensive heart disease with heart failure (HCC) 02/23/2017  . Essential hypertension 02/21/2017  . Type 2 diabetes mellitus with diabetic nephropathy (HCC) 02/21/2017  . H/O medication noncompliance 02/12/2017  . Chronic  kidney disease (CKD), stage III (moderate) 02/12/2017  . Systolic CHF, chronic (HCC) 10/11/2014    1. OSA excellent compliance with the CPAP 2. Sleep paralysis this may be of some concern. He has noted vivid dreams also when he naps. Overall although he feels better he is still not quite at baseline. He wants to hold off on MSLT 3. Obesity has done well with weight loss 4. Stroke no symptoms since 5. CHF systolic/diastolic compensated  General Counseling: I have discussed the findings of the evaluation and examination with Arlys John.  I have also discussed any further diagnostic evaluation thatmay be needed or ordered today. Jeter verbalizes understanding of the findings of todays visit. We also reviewed his medications today and discussed drug interactions and side effects including but not limited excessive drowsiness and altered mental states. We also discussed that there is always a risk not just to him but also people around him. he has been encouraged to call the office with any questions or concerns that should arise related to todays visit.  No orders of the defined  types were placed in this encounter.    Time spent:  I have personally obtained a history, examined the patient, evaluated laboratory and imaging results, formulated the assessment and plan and placed orders.    Yevonne Pax, MD [facility] Pulmonary and Critical Care Sleep medicine

## 2019-12-06 NOTE — Patient Instructions (Signed)
CPAP and BPAP Information CPAP and BPAP are methods of helping a person breathe with the use of air pressure. CPAP stands for "continuous positive airway pressure." BPAP stands for "bi-level positive airway pressure." In both methods, air is blown through your nose or mouth and into your air passages to help you breathe well. CPAP and BPAP use different amounts of pressure to blow air. With CPAP, the amount of pressure stays the same while you breathe in and out. With BPAP, the amount of pressure is increased when you breathe in (inhale) so that you can take larger breaths. Your health care provider will recommend whether CPAP or BPAP would be more helpful for you. Why are CPAP and BPAP treatments used? CPAP or BPAP can be helpful if you have:  Sleep apnea.  Chronic obstructive pulmonary disease (COPD).  Heart failure.  Medical conditions that weaken the muscles of the chest including muscular dystrophy, or neurological diseases such as amyotrophic lateral sclerosis (ALS).  Other problems that cause breathing to be weak, abnormal, or difficult. CPAP is most commonly used for obstructive sleep apnea (OSA) to keep the airways from collapsing when the muscles relax during sleep. How is CPAP or BPAP administered? Both CPAP and BPAP are provided by a small machine with a flexible plastic tube that attaches to a plastic mask. You wear the mask. Air is blown through the mask into your nose or mouth. The amount of pressure that is used to blow the air can be adjusted on the machine. Your health care provider will determine the pressure setting that should be used based on your individual needs. When should CPAP or BPAP be used? In most cases, the mask only needs to be worn during sleep. Generally, the mask needs to be worn throughout the night and during any daytime naps. People with certain medical conditions may also need to wear the mask at other times when they are awake. Follow instructions from your  health care provider about when to use the machine. What are some tips for using the mask?   Because the mask needs to be snug, some people feel trapped or closed-in (claustrophobic) when first using the mask. If you feel this way, you may need to get used to the mask. One way to do this is by holding the mask loosely over your nose or mouth and then gradually applying the mask more snugly. You can also gradually increase the amount of time that you use the mask.  Masks are available in various types and sizes. Some fit over your mouth and nose while others fit over just your nose. If your mask does not fit well, talk with your health care provider about getting a different one.  If you are using a mask that fits over your nose and you tend to breathe through your mouth, a chin strap may be applied to help keep your mouth closed.  The CPAP and BPAP machines have alarms that may sound if the mask comes off or develops a leak.  If you have trouble with the mask, it is very important that you talk with your health care provider about finding a way to make the mask easier to tolerate. Do not stop using the mask. Stopping the use of the mask could have a negative impact on your health. What are some tips for using the machine?  Place your CPAP or BPAP machine on a secure table or stand near an electrical outlet.  Know where the on/off   switch is located on the machine.  Follow instructions from your health care provider about how to set the pressure on your machine and when you should use it.  Do not eat or drink while the CPAP or BPAP machine is on. Food or fluids could get pushed into your lungs by the pressure of the CPAP or BPAP.  Do not smoke. Tobacco smoke residue can damage the machine.  For home use, CPAP and BPAP machines can be rented or purchased through home health care companies. Many different brands of machines are available. Renting a machine before purchasing may help you find out  which particular machine works well for you.  Keep the CPAP or BPAP machine and attachments clean. Ask your health care provider for specific instructions. Get help right away if:  You have redness or open areas around your nose or mouth where the mask fits.  You have trouble using the CPAP or BPAP machine.  You cannot tolerate wearing the CPAP or BPAP mask.  You have pain, discomfort, and bloating in your abdomen. Summary  CPAP and BPAP are methods of helping a person breathe with the use of air pressure.  Both CPAP and BPAP are provided by a small machine with a flexible plastic tube that attaches to a plastic mask.  If you have trouble with the mask, it is very important that you talk with your health care provider about finding a way to make the mask easier to tolerate. This information is not intended to replace advice given to you by your health care provider. Make sure you discuss any questions you have with your health care provider. Document Revised: 05/20/2018 Document Reviewed: 12/18/2015 Elsevier Patient Education  2020 [employer].    Hypersomnia Hypersomnia is a condition in which a person feels very tired during the day even though he or she gets plenty of sleep at night. A person with this condition may take naps during the day and may find it very difficult to wake up from sleep. Hypersomnia may affect a person's ability to think, concentrate, drive, or remember things. What are the causes? The cause of this condition may not be known. Possible causes include:  Certain medicines.  Sleep disorders, such as narcolepsy and sleep apnea.  Injury to the head, brain, or spinal cord.  Drug or alcohol use.  Gastroesophageal reflux disease (GERD).  Tumors.  Certain medical conditions, such as depression, diabetes, or an underactive thyroid gland (hypothyroidism). What are the signs or symptoms? The main symptoms of hypersomnia include:  Feeling very tired  throughout the day, regardless of how much sleep you got the night before.  Having trouble waking up. Others may find it difficult to wake you up when you are sleeping.  Sleeping for longer and longer periods at a time.  Taking naps throughout the day. Other symptoms may include:  Feeling restless, anxious, or annoyed.  Lacking energy.  Having trouble with: ? Remembering. ? Speaking. ? Thinking.  Loss of appetite.  Seeing, hearing, tasting, smelling, or feeling things that are not real (hallucinations). How is this diagnosed? This condition may be diagnosed based on:  Your symptoms and medical history.  Your sleeping habits. Your health care provider may ask you to write down your sleeping habits in a daily sleep log, along with any symptoms you have.  A series of tests that are done while you sleep (sleep study or polysomnogram).  A test that measures how quickly you can fall asleep during the  day (daytime nap study or multiple sleep latency test). How is this treated? Treatment can help you manage your condition. Treatment may include:  Following a regular sleep routine.  Lifestyle changes, such as changing your eating habits, getting regular exercise, and avoiding alcohol or caffeinated beverages.  Taking medicines to make you more alert (stimulants) during the day.  Treating any underlying medical causes of hypersomnia. Follow these instructions at home: Sleep routine   Schedule the same bedtime and wake-up time each day.  Practice a relaxing bedtime routine. This may include reading, meditation, deep breathing, or taking a warm bath before going to sleep.  Get regular exercise each day. Avoid strenuous exercise in the evening hours.  Keep your sleep environment at a cooler temperature, darkened, and quiet.  Sleep with pillows and a mattress that are comfortable and supportive.  Schedule short 20-minute naps for when you feel sleepiest during the  day.  Talk with your employer or teachers about your hypersomnia. If possible, adjust your schedule so that: ? You have a regular daytime work schedule. ? You can take a scheduled nap during the day. ? You do not have to work or be active at night.  Do not eat a heavy meal for a few hours before bedtime. Eat your meals at about the same times every day.  Avoid drinking alcohol or caffeinated beverages. Safety   Do not drive or use heavy machinery if you are sleepy. Ask your health care provider if it is safe for you to drive.  Wear a life jacket when swimming or spending time near water. General instructions  Take supplements and over-the-counter and prescription medicines only as told by your health care provider.  Keep a sleep log that will help your doctor manage your condition. This may include information about: ? What time you go to bed each night. ? How often you wake up at night. ? How many hours you sleep at night. ? How often and for how long you nap during the day. ? Any observations from others, such as leg movements during sleep, sleep walking, or snoring.  Keep all follow-up visits as told by your health care provider. This is important. Contact a health care provider if:  You have new symptoms.  Your symptoms get worse. Get help right away if:  You have serious thoughts about hurting yourself or someone else. If you ever feel like you may hurt yourself or others, or have thoughts about taking your own life, get help right away. You can go to your nearest emergency department or call:  Your local emergency services (911 in the [place]  A suicide crisis helpline, such as the National Suicide Prevention Lifeline at 716-199-3381. This is open 24 hours a day. Summary  Hypersomnia refers to a condition in which you feel very tired during the day even though you get plenty of sleep at night.  A person with this condition may take naps during the day and may find  it very difficult to wake up from sleep.  Hypersomnia may affect a person's ability to think, concentrate, drive, or remember things.  Treatment, such as following a regular sleep routine and making some lifestyle changes, can help you manage your condition. This information is not intended to replace advice given to you by your health care provider. Make sure you discuss any questions you have with your health care provider. Document Revised: 01/30/2017 Document Reviewed: 01/30/2017 Elsevier Patient Education  2020 [employer].

## 2019-12-10 ENCOUNTER — Other Ambulatory Visit: Payer: Self-pay | Admitting: *Deleted

## 2019-12-10 MED ORDER — ATORVASTATIN CALCIUM 80 MG PO TABS: 80.0000 mg | ORAL_TABLET | Freq: Every day | ORAL | 4 refills | Status: DC

## 2020-02-02 ENCOUNTER — Encounter: Payer: Self-pay | Admitting: Family Medicine

## 2020-02-02 ENCOUNTER — Ambulatory Visit (INDEPENDENT_AMBULATORY_CARE_PROVIDER_SITE_OTHER): Payer: Medicare Other | Admitting: Family Medicine

## 2020-02-02 ENCOUNTER — Other Ambulatory Visit: Payer: Self-pay

## 2020-02-02 VITALS — BP 143/98 | HR 80 | Temp 97.5°F | Resp 16 | Ht 67.0 in | Wt 256.6 lb

## 2020-02-02 DIAGNOSIS — H00024 Hordeolum internum left upper eyelid: Secondary | ICD-10-CM | POA: Diagnosis not present

## 2020-02-02 MED ORDER — POLYMYXIN B-TRIMETHOPRIM 10000-0.1 UNIT/ML-% OP SOLN: 1.0000 [drp] | OPHTHALMIC | 1 refills | Status: DC

## 2020-02-02 NOTE — Patient Instructions (Signed)
Thank you for coming to the office today.  1. It looks like a Stye or "Hordeoleum" of your eyelid, this is a blocked duct infection, it may come back again if it does not fully resolve. If it develops a more hard or firm cyst, we consider it a "Chalazion" and it is less likely to resolve on it's own - Initial treatment is warm compresses up to 4 to 6 times a day using warm washcloth place over eyelid and apply gentle pressure, hold this on for 10-15 min at a time, can re-warm if cools down - Also given size and redness we will start topical eye antibiotic drops - Polytrimb 1 drop every 4 to 6 hours (4 to 6 drops per day) for 1-2 weeks  It may drain pus and reduce in size, this is ideal, otherwise if significant worsening with spreading of redness or swelling onto eyelid or around face, unable to see, fevers/chills, then please return to clinic sooner or call, may go to Emergency Dept if significant worsening   Do not wear any contacts until this heals  If not resolving - can check with one of these locations.   [facility]   Address: [address] [place], [place] [address] Phone: [phone]  Website: [email or web address]   [facility] [address], [place], [place] [address] Phone: [phone] [email or web address]  [facility]  Address: [address] [place], [place], [place] [address] Phone: [phone]   [facility] [address] [place], [place] [address] Phone: [phone]  [facility] Address: [address] [place], [place], [place] [address]  Phone: [phone]   Please schedule a Follow-up Appointment to: Return if symptoms worsen or fail to improve.  If you have any other questions or concerns, please feel free to call the office or send a message through MyChart. You may also schedule an earlier appointment if necessary.  Additionally, you may be receiving a survey about your experience at our office within a few days to 1 week by e-mail  or mail. We value your feedback.  Saralyn Pilar, DO [facility], [place]

## 2020-02-02 NOTE — Progress Notes (Signed)
Subjective:    Patient ID: Joel [name], male    DOB: 1980/11/13, 39 y.o.   MRN: [id number]  Joel [name] is a 39 y.o. male presenting on 02/02/2020 for Stye (Left upper eye onset swelling past three days but knot feeling from couple of months)   HPI   Left Upper Internal Hordeolum Stye Eye Reports chronic recurrent issue, had a stye in the past on Right eye that took >1 year to resolve. Now in past >3-6 months had a possible stye in left upper eyelid but it did not swell or bother him until recently past few days swollen and inflamed and red and irritated. He admits eye watering - Denies any eye discharge, itching, loss of vision blurry vision, redness of eye, fever chills congestion headache   Depression screen [facility] 2/9 06/04/2019 04/21/2019 03/03/2019  Decreased Interest 0 2 1  Down, Depressed, Hopeless 0 2 2  PHQ - 2 Score 0 4 3  Altered sleeping - 2 1  Tired, decreased energy - 3 3  Change in appetite - 3 3  Feeling bad or failure about yourself  - 2 3  Trouble concentrating - 2 2  Moving slowly or fidgety/restless - 2 1  Suicidal thoughts - 1 2  PHQ-9 Score - 19 18  Difficult doing work/chores - Extremely dIfficult Somewhat difficult  Some recent data might be hidden    Social History   Tobacco Use  . Smoking status: Never Smoker  . Smokeless tobacco: Never Used  Vaping Use  . Vaping Use: Never used  Substance Use Topics  . Alcohol use: Not Currently    Comment: Very infrequently; ~2-3 drinks/year   . Drug use: No    Review of Systems Per HPI unless specifically indicated above     Objective:    BP (!) 143/98   Pulse 80   Temp (!) 97.5 F (36.4 C) (Temporal)   Resp 16   Ht 5\' 7"  (1.702 m)   Wt 256 lb 9.6 oz (116.4 kg)   SpO2 100%   BMI 40.19 kg/m   Wt Readings from Last 3 Encounters:  02/02/20 256 lb 9.6 oz (116.4 kg)  12/06/19 251 lb 6.4 oz (114 kg)  10/26/19 257 lb (116.6 kg)    Physical Exam Vitals and nursing note reviewed.  Constitutional:       General: He is not in acute distress.    Appearance: He is well-developed and well-nourished. He is not diaphoretic.  Eyes:     General:        Right eye: No discharge.        Left eye: No discharge.     Extraocular Movements: Extraocular movements intact.     Conjunctiva/sclera: Conjunctivae normal.     Pupils: Pupils are equal, round, and reactive to light.     Comments: Left upper eyelid with internal moderately sized hordeolum on exam, mild tender with some swelling no extending erythema or cellulitis on exam.  Skin:    General: Skin is warm and dry.  Neurological:     Mental Status: He is alert.    Results for orders placed or performed in visit on 06/21/19  Antiphospholipid syndrome eval, bld  Result Value Ref Range   APTT PPP 30.1 22.9 - 30.2 sec   PT 11.3 9.1 - 12.0 sec   INR 1.1 0.9 - 1.2   Thrombin Time 20.3 0.0 - 23.0 sec   Dilute Viper Venom Time 32.2 0.0 - 47.0 sec  Hexagonal Phase Phospholipid 0 0 - 11 sec   Anticardiolipin IgG <9 0 - 14 GPL U/mL   Anticardiolipin IgM 11 0 - 12 MPL U/mL   Beta-2 Glyco I IgG <9 0 - 20 GPI IgG units   Beta-2 Glyco 1 IgM <9 0 - 32 GPI IgM units   APS Panel Interpretation Comment   Beta-2-glycoprotein i abs, IgG/M/A  Result Value Ref Range   Beta-2 Glyco 1 IgA <9 0 - 25 GPI IgA units  Coag Studies Interp Report  Result Value Ref Range   Interpretation Note       Assessment & Plan:   Problem List Items Addressed This Visit   None   Visit Diagnoses    Hordeolum internum left upper eyelid    -  Primary   Relevant Medications   trimethoprim-polymyxin b (POLYTRIM) ophthalmic solution      Acute on chronic L upper internal hordeolum, recurrence from prior episode months ago. No evidence of complication, without conjunctivitis or extending eyelid/peri-orbital erythema or edema. - inadequate conservative treatment at home  Plan: 1. Start moist warm compresses over Left eyelid 10-15 min at a time, up to 4-6 times daily  until resolution 2. Start Polytrim antibiotic eye drop 1 drop every 4-6 hours for 1-2 weeks 3. Strict return precautions for spreading infection, otherwise if mild improvement but persistent hordeolum (or develops more hard chalazion) then asked to notify us and we can refer to Ophthalmology for I&D removal - also gave handout with optometry locally 4. Follow-up within 1-2 weeks as needed     Meds ordered this encounter  Medications  . trimethoprim-polymyxin b (POLYTRIM) ophthalmic solution    Sig: Place 1 drop into the left eye every 4 (four) hours. For 1-2 weeks for stye    Dispense:  10 mL    Refill:  1     Follow up plan: Return if symptoms worsen or fail to improve.   Saralyn Pilar, DO [facility] 02/02/2020, 10:47 AM

## 2020-02-14 ENCOUNTER — Other Ambulatory Visit: Payer: Self-pay

## 2020-02-14 MED ORDER — AMLODIPINE BESYLATE 10 MG PO TABS: 10.0000 mg | ORAL_TABLET | Freq: Every day | ORAL | 0 refills | Status: DC

## 2020-03-01 ENCOUNTER — Telehealth: Payer: Self-pay | Admitting: Cardiovascular Disease

## 2020-03-01 NOTE — Telephone Encounter (Signed)
Received medical records request form Disability Determination Services, forwarded to [facility] Records

## 2020-03-10 NOTE — Telephone Encounter (Signed)
Received second request for records, forward to [facility] medical records

## 2020-03-29 ENCOUNTER — Telehealth: Payer: Self-pay

## 2020-03-29 NOTE — Telephone Encounter (Signed)
Mailed records to [facility]

## 2020-04-04 DIAGNOSIS — Z0271 Encounter for disability determination: Secondary | ICD-10-CM

## 2020-04-09 NOTE — Progress Notes (Signed)
Date:  04/10/2020   ID:  [name], DOB 1980-11-06, MRN [id number]  Patient Location:  [address] [place] [address]   Provider location:   [facility], [place] office  PCP:  [name], DO  Cardiologist:  Hubbard Robinson [facility]  Chief Complaint  Patient presents with  . Follow-up    6 Months follow up. Medications verbally reviewed with patient.     History of Present Illness:    Joel [name] is a 40 y.o. male past medical history of systolic dysfunction, nonischemic cardiomyopathy secondary to hypertensive heart disease Stress test showing no ischemia only small fixed defects attenuation artifact CKD stage III-IV,  hypertensive heart disease,  DM, followed by endocrine HLD,  medication noncompliance,  obesity,  sleep apenaon CPAP Hospital admission February 13, 2017 acute on chronic combined CHF hypertensive emergency. Last echocardiogram 2021 ejection fraction 55% He presents for follow-up of his systolic and diastolic CHF  LOV 10/06/2019 Last clinic visit had changed his diet, weight was down 30 pounds He cut back on fast food Blood pressure was elevated 150 systolic  Was on amlodipine 10 daily, carvedilol 12.5 twice daily  On today's visit he has stopped all his medications including his diabetes medications and cholesterol medication He feels he could fix everything by losing weight Also felt the medications might be slowing him down from losing weight Weight is now down to 224 pounds, this visit August 2021 weight was 254  Denies any leg swelling, shortness of breath on exertion Is not checking pressure at home Significant other presents with him today  Active, otherwise no complaints  Reports that he saw nephrology once, did not go back  Most recent lab work reviewed from February 2021,  Creatinine of 2 Reports he is scheduled to see endocrine," they always do lab work there"  EKG personally reviewed by  myself on todays visit Shows normal sinus rhythm rate696 bpm poor R wave progression to the anterior precordial leads left axis deviation  Other past medical history reviewed Acute CVA 02/2019 facial numbness and dizziness and right-sided weakness  2D echo showed normal ejection fraction without cardiac source of embolism.  Seen by neurology Recommend dual antiplatelet therapy with aspirin and Plavix for 3 weeks followed by Plavix alone  MR ANGIO HEAD WO CONTRAST 03/20/2019 1. No large vessel occlusion. Possible occlusion of the right AICA, or it could be diminutive or absent.  2. Severe stenosis versus artifact in the left PCA distal P2/proximal P3 segment.  3. Suggestion of intracranial atherosclerosis involving the anterior and posterior circulation, including mild irregularity of the: distal right vertebral artery, basilar artery, supraclinoid right ICA, and bilateral MCA branches.  4. Non dominant left vertebral artery functionally terminates in PICA. Fetal type bilateral PCA origins.   MR ANGIO NECK   03/20/2019 Negative neck MRA. Dominant right vertebral artery.   MR BRAIN 03/19/2019 Small acute infarction involving the right middle cerebellar peduncle. Corresponding enhancement may reflect early blood brain barrier disruption. Given presence of vascular risk factors, ischemia is favored over demyelination. There is also a probable small chronic infarct near the right frontal horn.   Presented to the hospital 02/12/17/18 due to HF and HTN.  Hypertensive urgency on arrival Echo report from 02/12/17 EF of 25-30%  Aggressive diuresis, blood pressure control    Prior CV studies:   The following studies were reviewed today:  Echo 02/2017 Left ventricle: The cavity size was normal. There was  moderate   concentric hypertrophy. Systolic function was severely reduced.   The estimated ejection fraction was in the range of 25% to 30%.   Diffuse hypokinesis. Regional wall motion abnormalities  cannot be   excluded. Features are consistent with a pseudonormal left   ventricular filling pattern, with concomitant abnormal relaxation   and increased filling pressure (grade 2 diastolic dysfunction).   Past Medical History:  Diagnosis Date  . Anxiety   . Chronic diastolic CHF (congestive heart failure) (HCC)   . Hyperlipidemia   . Hypertension   . Hypertensive heart disease   . Noncompliance with medications   . Obesity   . Renal disorder   . Stroke (HCC)   . Systolic dysfunction    a. TTE 06/2014: EF 45-50%, normal wall motion, mild MR, mild AI   Past Surgical History:  Procedure Laterality Date  . NO PAST SURGERIES       Current Meds  Medication Sig  . amLODipine (NORVASC) 10 MG tablet Take 1 tablet (10 mg total) by mouth daily.  Marland Kitchen atorvastatin (LIPITOR) 80 MG tablet Take 1 tablet (80 mg total) by mouth daily.  . carvedilol (COREG) 12.5 MG tablet Take 1 tablet (12.5 mg total) by mouth 2 (two) times daily.  . clopidogrel (PLAVIX) 75 MG tablet Take 1 tablet (75 mg total) by mouth daily.  . empagliflozin (JARDIANCE) 25 MG TABS tablet Take by mouth.  . ezetimibe (ZETIA) 10 MG tablet Take 10 mg by mouth daily.  Marland Kitchen liraglutide (VICTOZA) 18 MG/3ML SOPN Inject 1.2 mg into the skin daily.   Marland Kitchen omeprazole (PRILOSEC) 20 MG capsule Take 20 mg by mouth daily.  Marland Kitchen spironolactone (ALDACTONE) 25 MG tablet Take 1 tablet (25 mg total) by mouth daily.  Marland Kitchen trimethoprim-polymyxin b (POLYTRIM) ophthalmic solution Place 1 drop into the left eye every 4 (four) hours. For 1-2 weeks for stye     Allergies:   Bidil [isosorb dinitrate-hydralazine] and Nitroglycerin , H/A  Social History   Tobacco Use  . Smoking status: Never Smoker  . Smokeless tobacco: Never Used  Vaping Use  . Vaping Use: Never used  Substance Use Topics  . Alcohol use: Not Currently    Comment: Very infrequently; ~2-3 drinks/year   . Drug use: No    Family Hx: The patient's family history includes CAD in his father,  paternal grandfather, and paternal grandmother; Colon cancer in his paternal grandfather; Diabetes in his maternal grandfather, maternal grandmother, mother, and paternal uncle; Heart attack in his father; Heart disease in his father; Hyperlipidemia in his mother; Hypertension in his mother; Stroke in his father.  ROS:   Please see the history of present illness.    Review of Systems  Constitutional:       Weight loss  HENT: Negative.   Respiratory: Negative.   Cardiovascular: Negative.   Gastrointestinal: Negative.   Musculoskeletal: Negative.   Neurological: Negative.   Psychiatric/Behavioral: Negative.   All other systems reviewed and are negative.    Labs/Other Tests and Data Reviewed:    Recent Labs: No results found for requested labs within last 8760 hours.   Recent Lipid Panel Lab Results  Component Value Date/Time   CHOL 264 (H) 03/20/2019 01:13 AM   CHOL 361 (H) 06/11/2014 12:32 PM   TRIG 162 (H) 03/20/2019 01:13 AM   TRIG 246 (H) 06/11/2014 12:32 PM   HDL 21 (L) 03/20/2019 01:13 AM   HDL 41 06/11/2014 12:32 PM   CHOLHDL 12.6 03/20/2019 01:13 AM  LDLCALC 211 (H) 03/20/2019 01:13 AM   LDLCALC 271 (H) 06/11/2014 12:32 PM    Wt Readings from Last 3 Encounters:  04/10/20 224 lb (101.6 kg)  02/02/20 256 lb 9.6 oz (116.4 kg)  12/06/19 251 lb 6.4 oz (114 kg)     Exam:    Vital Signs: Vital signs may also be detailed in the HPI BP (!) 166/116 (BP Location: Left Arm, Patient Position: Sitting, Cuff Size: Normal)   Ht 5\' 7"  (1.702 m)   Wt 224 lb (101.6 kg)   SpO2 98%   BMI 35.08 kg/m    Constitutional:  oriented to person, place, and time. No distress.  HENT:  Head: Grossly normal Eyes:  no discharge. No scleral icterus.  Neck: No JVD, no carotid bruits  Cardiovascular: Regular rate and rhythm, no murmurs appreciated Pulmonary/Chest: Clear to auscultation bilaterally, no wheezes or rails Abdominal: Soft.  no distension.  no tenderness.  Musculoskeletal:  Normal range of motion Neurological:  normal muscle tone. Coordination normal. No atrophy Skin: Skin warm and dry Psychiatric: normal affect, pleasant   ASSESSMENT & PLAN:    Dilated cardiomyopathy (HCC) EF has fluctuated 45 to 50% then dropped 25 to 30% in 2019 Ejection fraction back up to normal January 2021 holding off on Entresto ACE ARB given renal failure He has stopped all of his medications on today's visit as detailed above Recommend he monitor blood pressure at home call February 2021 with some numbers we will likely restart carvedilol 12.5 twice daily first for high blood pressure We will hold off on spironolactone restart given renal dysfunction potential for high potassium Potentially could use amlodipine, hydralazine, clonidine, Cardura if additional medications needed  systolic CHF, chronic (HCC) -  Appears euvolemic Normal ejection fraction January 2021 Weight improved Medications as above, he stopped on his own we will look to restart giving blood pressure data from home  Essential hypertension intolerance to BiDil, isosorbide Avoiding ACE, ARB, Entresto in the setting of renal failure Hydralazine not used secondary to 3 times daily dosing  carvedilol up to 12.5 twice daily amlodipine started on last clinic visit Blood pressure elevated on today's visit on no medications We will look to reintroduce medications pending his home blood pressure data  Acute stroke/follow-up Residual right-sided deficits  cerebrovascular disease  on MRI/MRA He stopped Lipitor and Plavix Recommend he at least take a low-dose aspirin  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

## 2020-04-10 ENCOUNTER — Encounter: Payer: Self-pay | Admitting: Cardiovascular Disease

## 2020-04-10 ENCOUNTER — Other Ambulatory Visit: Payer: Self-pay

## 2020-04-10 ENCOUNTER — Ambulatory Visit (INDEPENDENT_AMBULATORY_CARE_PROVIDER_SITE_OTHER): Payer: Medicare Other | Admitting: Cardiovascular Disease

## 2020-04-10 VITALS — BP 166/116 | HR 69 | Ht 67.0 in | Wt 224.0 lb

## 2020-04-10 DIAGNOSIS — E1121 Type 2 diabetes mellitus with diabetic nephropathy: Secondary | ICD-10-CM

## 2020-04-10 DIAGNOSIS — I110 Hypertensive heart disease with heart failure: Secondary | ICD-10-CM

## 2020-04-10 DIAGNOSIS — E1165 Type 2 diabetes mellitus with hyperglycemia: Secondary | ICD-10-CM

## 2020-04-10 DIAGNOSIS — I5022 Chronic systolic (congestive) heart failure: Secondary | ICD-10-CM | POA: Diagnosis not present

## 2020-04-10 DIAGNOSIS — I10 Essential (primary) hypertension: Secondary | ICD-10-CM

## 2020-04-10 DIAGNOSIS — I420 Dilated cardiomyopathy: Secondary | ICD-10-CM | POA: Diagnosis not present

## 2020-04-10 DIAGNOSIS — IMO0002 Reserved for concepts with insufficient information to code with codable children: Secondary | ICD-10-CM

## 2020-04-10 MED ORDER — ASPIRIN EC 81 MG PO TBEC: 81.0000 mg | DELAYED_RELEASE_TABLET | Freq: Every day | ORAL | 11 refills | Status: DC

## 2020-04-10 NOTE — Patient Instructions (Addendum)
Dr. Laverda Sorenson is a nephrologist in [place]  [phone] Dr. Mosetta Pigeon specializes in nephrology in [place]  [phone]  Medication Instructions:  Aspirin 81 mg daily Depending on BP readings may need to restart medications (see below)  Lab work: No new labs needed  Testing/Procedures: No new testing needed   Follow-Up:  . You will need a follow up appointment in 6 months  . Providers on your designated Care Team:   . Nicolasa Ducking, NP . Eula Listen, PA-C . Marisue Ivan, PA-C  Please monitor blood pressures and keep a log of your readings.    For pressures >130/85, restart coreg 12.5 twice a day  Call if still elevated, we may need to restart amlodipine 5 to 10 daily or spironolactone  How to use a home blood pressure monitor. . Be still. . Measure at the same time every day. It's important to take the readings at the same time each day, such as morning and evening. Take reading approximately 1 1/2 to 2 hours after BP medications.   AVOID these things for 30 minutes before checking your blood pressure:  Drinking caffeine.  Drinking alcohol.  Eating.  Smoking.  Exercising.   Five minutes before checking your blood pressure:  Pee.  Sit in a dining chair. Avoid sitting in a soft couch or armchair.  Be quiet. Do not talk.       Sit correctly. Sit with your back straight and supported (on a dining chair, rather than a sofa). Your feet should be flat on the floor and your legs should not be crossed. Your arm should be supported on a flat surface (such as a table) with the upper arm at heart level. Make sure the bottom of the cuff is placed directly above the bend of the elbow.

## 2020-04-20 ENCOUNTER — Ambulatory Visit: Payer: Medicare Other | Admitting: Nurse Practitioner

## 2020-04-24 ENCOUNTER — Encounter: Payer: Self-pay | Admitting: Adult Health

## 2020-04-24 ENCOUNTER — Ambulatory Visit (INDEPENDENT_AMBULATORY_CARE_PROVIDER_SITE_OTHER): Payer: Medicare Other | Admitting: Adult Health

## 2020-04-24 VITALS — BP 167/116 | HR 86 | Ht 67.0 in | Wt 220.0 lb

## 2020-04-24 DIAGNOSIS — I639 Cerebral infarction, unspecified: Secondary | ICD-10-CM | POA: Diagnosis not present

## 2020-04-24 DIAGNOSIS — H9041 Sensorineural hearing loss, unilateral, right ear, with unrestricted hearing on the contralateral side: Secondary | ICD-10-CM | POA: Diagnosis not present

## 2020-04-24 DIAGNOSIS — R42 Dizziness and giddiness: Secondary | ICD-10-CM | POA: Diagnosis not present

## 2020-04-24 DIAGNOSIS — I69398 Other sequelae of cerebral infarction: Secondary | ICD-10-CM

## 2020-04-24 MED ORDER — CLOPIDOGREL BISULFATE 75 MG PO TABS: 75.0000 mg | ORAL_TABLET | Freq: Every day | ORAL | 3 refills | Status: DC

## 2020-04-24 NOTE — Progress Notes (Signed)
[facility] [address] [place]. [place] [address] [phone]       STROKE FOLLOW UP NOTE  Mr. KIEFFER BLATZ Date of Birth:  [birth year] Medical Record Number:  [id number]   Reason for Referral: stroke follow up    CHIEF COMPLAINT:  Chief Complaint  Patient presents with  . Follow-up    RM 14 with Shanda Bumps (spouse) Pt states he has buzzing in his ear, dizziness, and forgets what he is saying sometimes     HPI:   Today, 04/24/2020, Mr. Lemmerman returns for 28-month stroke follow-up accompanied by his wife  Reports residual right ear tinnitus and hearing loss, occasional dizziness more with increased exertion (such as going to the gym to work out), occasional imbalance and short-term memory loss Evaluated by Dr. Ezzard Standing 11/2019 with right ear moderate severe sensorineural hearing loss recommended follow-up for hearing aid but they are currently waiting to obtain hearing aid due to financial reasons  Short-term memory loss consistent since his stroke -denies long-term memory loss concerns.  Maintains ADLs and IADLs without difficulty Denies new stroke/TIA symptoms  Reports self discontinuing ALL prescribed medications a couple months ago as he wanted to see what medications he actually still needed to continue on or still needed - reports drastic dietary changes, routine exercise and activity and purposeful 60 lb weight loss since his stroke (03/2019)  He has since been recently restarted back on spironolactone and carvedilol -BP remains uncontrolled elevated today and typically 160/110-120s at home Cardiology also advised to initiate aspirin 81 mg daily for secondary stroke prevention (as self d/c'd plavix) Not currently on diabetic regimen as glucose levels recently in the 80s with recent A1c 6.1 Has f/u w/ endocrinology 3/31 with plans on repeat lab work including lipid panel as he is currently not on statin therapy  Compliant on CPAP for OSA management routinely  followed by pulmonology  No further concerns at this time      History provided for reference purposes only Update 10/26/2019 JM: Mr. Hoagland returns for stroke follow-up Patient reports residual stroke deficits tinnitus and hearing deficit. He does report with improvement of hearing, the tinnitus worsens. Vertigo improving but can worsen with increased exertion. Quick episode 2-3 seconds and then resolves.  Denies residual anxiety difficulty Denies new stroke/TIA symptoms  Remains on Plavix without bleeding or bruising Remains on Crestor 40 mg daily and Zetia without side effects Blood pressure today 134/88 Glucose levels not routinely monitored at home - typically stable when he does check Reports ongoing use of CPAP for OSA management  During hospital admission, lab work showed concern for antiphospholipid syndrome.  Repeat lab work >12 weeks later all within normal limits  No further concerns at this time  Update 06/21/2019 JM: Mr. 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  He is accompanied by his wife.  Residual deficits of right-sided hearing impairment with tinnitus and dizziness with some improvement.  Referral placed to neuro otologist after prior visit but they have not been called to schedule evaluation.  Previously working with outpatient therapies but currently placed on hold per patient request.  Prior visit concern for underlying anxiety contributing to dizziness sensation with panic attacks and initiated short course of Xanax.  GAD-7 score remains elevated but he has not had any additional panic attacks and has only used 1 dose of Xanax.  Continues to use meclizine daily with ongoing benefit.  He will  experience short episode of vertigo typically daily but only lasts for a few minutes and then resolves.  Continues on DAPT despite 47-month recommendation but denies bleeding or bruising.  Recent  follow-up with endocrinology showed continued elevated LDL at 156 therefore switch to atorvastatin to Crestor 40 mg daily as well as addition of Zetia.  Continues to follow with endocrinology for DM management as well with recent A1c 6.1.  Blood pressure today 146/106, asymptomatic.  Does not routinely monitor at home.  Endorses ongoing use of his CPAP for OSA management.  During stroke admission, hypercoagulable work-up consistent with presence of lupus anticoagulant meeting criteria for antiphospholipid syndrome.  No further concerns at this time.  Initial visit 04/21/2019: Mr. Mittag is a 40 year old male who is being seen today for stroke follow-up accompanied by his wife.  Residual stroke deficits mild right-sided weakness, dizziness, vertigo, nausea and right ear tinnitus with decreased hearing.  Currently working with physical therapy with improvement of his right side but endorses only minimal improvement of vestibular symptoms as well as symptoms waxing/waning.  Previously complaining of headaches which have since improved consisting of generalized pressure radiating down into his neck.  Underlying history of depression and does endorse worsening anxiety since his stroke.  Not currently on any medication therapy.  Continues on aspirin 325 mg daily and Plavix without bleeding or bruising.  Continues on atorvastatin without myalgias.  Blood pressure today 109/76 and does monitor at home typically 150/110-120.  Prior difficulty with blood pressure which has since improved.  Wife is not sure the accuracy of home cuff as they are using an older cuff.  Ongoing use of CPAP for OSA management.  No further concerns at this time.  Stroke admission 03/19/2019: Mr. DEJAUN VIDRIO is a 40 y.o. male with history of diabetes, CKD stage IIIb, hypertension, hyperlipidemia, EF 45-50%, CHF and hx of medical non compliance,  who presented on 03/19/2019 with dizziness, gait instability, loss of hearing R ear, facial numbness,  nausea and vomiting. He did not receive IV t-PA due to late presentation (>4.5 hours from time of onset).  Evaluated by stroke team and Dr. 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  MRA head also showed severe stenosis versus artifact in left PCA distal P2/proximal P3 segment, intracranial arthrosclerosis involving anterior and posterior circulation including distal right vertebral artery, basilar artery, supraclinoid right ICA and bilateral MCA branches.  History of HTN and recommend long-term BP goal normotensive range.  LDL 211 and recommended continuation of atorvastatin 80 mg daily with medication compliance education provided.  DM type II controlled A1c 7.0.  History of cardiomyopathy/CHF with current admission EF 55 to 60% (02/2017 EF 25 to 30%) and recommended continued follow-up with cardiology.  Other stroke risk factors include prior EtOH use, morbid obesity, family history of stroke, OSA on CPAP and possible chronic stroke on imaging.  Recommended DAPT for 3 months then Plavix alone as previously on aspirin given intracranial stenosis.  Evaluated by therapies who recommended CIR but patient declined preferring to be discharged home.  Stroke: Small acute right middle cerebellar peduncle infarction due to large vessel disease with right AICA occlusion.  Resultant right hemiataxia, mild right hemiparesis, right-sided hearing loss and right face numbness  CT no acute abnormalities  MRI head W&WO - Small acute infarction involving the right middle cerebellar peduncle. Corresponding enhancement may reflect early blood brain barrier disruption. Given presence of vascular  risk factors, ischemia is favored over demyelination. There is also a probable small chronic infarct near the right frontal horn.   MRA head - Possible occlusion of the right AICA, or it could be diminutive or absent. Severe  stenosis versus artifact in the left PCA distal P2/proximal P3 segment. Suggestion of intracranial atherosclerosis involving the anterior and posterior circulation, including mild irregularity of the: distal right vertebral artery, basilar artery, supraclinoid right ICA, and bilateral MCA branches.   TCD bubble study - no PFO  2D Echo - EF 55 - 60%. No cardiac source of emboli identified. Severely increased left ventricular hypertrophy.   Sars Corona Virus 2 - negative  LDL - 211  HgbA1c - 7.0  Hypercoagulable labs positive lupus anticoagulant  UDS - benzodiazepine  VTE prophylaxis - Lovenox 40  aspirin 81 mg daily prior to admission, now on aspirin 325 mg daily and clopidogrel 75 mg daily. Continue DAPT for 3 months and then plavix alone given intracranial stenosis  Therapy recommendations:  CIR -> Rehab consult->Pt prefers to go home       ROS:   14 system review of systems performed and negative with exception of those listed in HPI  PMH:  Past Medical History:  Diagnosis Date  . Anxiety   . Chronic diastolic CHF (congestive heart failure) (HCC)   . Hyperlipidemia   . Hypertension   . Hypertensive heart disease   . Noncompliance with medications   . Obesity   . Renal disorder   . Stroke (HCC)   . Systolic dysfunction    a. TTE 06/2014: EF 45-50%, normal wall motion, mild MR, mild AI    PSH:  Past Surgical History:  Procedure Laterality Date  . NO PAST SURGERIES      Social History:  Social History   Socioeconomic History  . Marital status: Married    Spouse name: Not on file  . Number of children: 1  . Years of education: 5612  . Highest education level: 12th grade  Occupational History  . Not on file  Tobacco Use  . Smoking status: Never Smoker  . Smokeless tobacco: Never Used  Vaping Use  . Vaping Use: Never used  Substance and Sexual Activity  . Alcohol use: Not Currently    Comment: Very infrequently; ~2-3 drinks/year   . Drug use: No  .  Sexual activity: Yes    Birth control/protection: None  Other Topics Concern  . Not on file  Social History Narrative  . Not on file   Social Determinants of Health   Financial Resource Strain: Not on file  Food Insecurity: Not on file  Transportation Needs: Not on file  Physical Activity: Not on file  Stress: Not on file  Social Connections: Not on file  Intimate Partner Violence: Not on file    Family History:  Family History  Problem Relation Age of Onset  . Hypertension Mother   . Hyperlipidemia Mother   . Diabetes Mother   . CAD Father        a. stenting in his early 350s  . Heart disease Father   . Stroke Father   . Heart attack Father   . Diabetes Maternal Grandmother   . Diabetes Maternal Grandfather   . CAD Paternal Grandmother   . CAD Paternal Grandfather   . Colon cancer Paternal Grandfather   . Diabetes Paternal Uncle     Medications:   Current Outpatient Medications on File Prior to Visit  Medication Sig Dispense Refill  .  aspirin EC 81 MG tablet Take 1 tablet (81 mg total) by mouth daily. Swallow whole. 30 tablet 11  . carvedilol (COREG) 12.5 MG tablet Take 12.5 mg by mouth 2 (two) times daily with a meal.    . spironolactone (ALDACTONE) 25 MG tablet Take 25 mg by mouth daily.     No current facility-administered medications on file prior to visit.    Allergies:   Allergies  Allergen Reactions  . Bidil [Isosorb Dinitrate-Hydralazine] Other (See Comments)    Severe headache  . Nitroglycerin Other (See Comments)    Causes severe headaches     Physical Exam  Vitals:   04/24/20 0904  BP: (!) 167/116  Pulse: 86  Weight: 220 lb (99.8 kg)  Height: 5\' 7"  (1.702 m)   Body mass index is 34.46 kg/m. No exam data present   General: well developed, well nourished, pleasant middle aged AA male, seated, in no evident distress Head: head normocephalic and atraumatic.   Neck: supple with no carotid or supraclavicular bruits Cardiovascular: regular  rate and rhythm, no murmurs Musculoskeletal: no deformity Skin:  no rash/petichiae Vascular:  Normal pulses all extremities   Neurologic Exam Mental Status: Awake and fully alert. Normal speech and language. Oriented to place and time. Recent and remote memory intact. Attention span, concentration and fund of knowledge appropriate. Mood and affect appropriate Cranial Nerves: Pupils equal, briskly reactive to light. Extraocular movements full with nystagmus only when looking towards left.  Visual fields full to confrontation.  Positive Rinne test (AC>BC bilaterally).  Weber's test heard in good ear.  Facial sensation intact. Face, tongue, palate moves normally and symmetrically.  Motor: Normal bulk and tone.  Normal strength in all tested extremities Sensory.: intact to touch , pinprick , position and vibratory sensation.  Coordination: Rapid alternating movements normal in all extremities.  Finger-to-nose and heel-to-shin performed accurately bilaterally. Gait and Station: Arises from chair without difficulty. Stance is normal. Gait demonstrates normal stride and mild imbalance greater when making turns w/o use of AD.  Able to tandem walk without difficulty. Romberg positive. Reflexes: 1+ and symmetric. Toes downgoing.        ASSESSMENT: ARUSH MICA is a 40 y.o. year old male presented with dizziness, gait instability, loss of hearing right ear, facial numbness and nausea/vomiting on 03/19/2019 with stroke work-up revealing small acute right middle cerebellar peduncle infarction secondary to large vessel disease with right AICA occlusion. Vascular risk factors include HTN, HLD, DM, CHF, OD ocular occlusion 2014, intracranial stenosis and OSA on CPAP.      PLAN:  1. Right cerebellar stroke:  a. Residual deficits: Occasional vertigo, imbalance and sensorineural hearing loss b. C/o short-term memory loss likely multifactorial from prior stroke, hearing loss, chronic depression/anxiety and  current uncontrolled risk factors after self d/c'd ALL medications "a couple of months ago". Discussed memory exercises as well as importance of managing stressors, aggressive stroke risk factor management, medication compliance and possible need to follow-up with PCP for depression/anxiety management c. Weight loss approx 60 lbs in the past year -congratulated on this accomplishment - his goal is to be less than 200 lbs -encouraged continued routine exercise and healthy diet d. Repeat hypercoagulable labs negative on repeat testing e. Recommend restarting clopidogrel 75 mg daily and discontinuing aspirin as recent stroke occurred despite aspirin compliance -refill provided f. Discussed secondary stroke prevention measures and importance of close PCP follow-up for aggressive stroke risk factor management as well as importance of medication compliance 2. HTN: BP goal<130/90.  Uncontrolled recently restarted back on on spironolactone and carvedilol monitor by cardiology 3. HLD: LDL goal <70.  Prior LDL 156 (05/2019) - self d/c'd crestor and zetia - has f/u with endo 3/31 with plans on repeating lipid panel and likely restart of statins 4. DMII: A1c goal <7.0.  A1c 6.1 (12/2019) on nonpharmacological management monitored by endocrinology 5. OSA on CPAP: Ongoing compliance for secondary stroke prevention followed routinely by pulmonology Dr. Welton Flakes   Follow up in 6 months or call earlier if needed   CC:   [facility] provider: Dr. Geralyn Flash, Netta Neat, DO    I spent 35 minutes of face-to-face and non-face-to-face time with patient and wife.  This included previsit chart review, lab review, study review, order entry, electronic health record documentation, patient education regarding stroke and residual deficits, importance of managing stroke risk factors and importance of medication compliance, and answered all questions to patient and wife satisfaction   Ihor Austin, AGNP-BC  [facility] [address] [place], [place] [address]  Phone [phone] Fax [phone] Note: This document was prepared with digital dictation and possible smart phrase technology. Any transcriptional errors that result from this process are unintentional.

## 2020-04-24 NOTE — Patient Instructions (Signed)
Restart clopidogrel 75 mg daily and stop aspirin at this time  Ensure follow up with endocrinology for repeat lab work and possible need of restarting cholesterol medications  Continue to follow up with PCP/endo/cardiology regarding blood pressure, cholesterol and diabetes management  Maintain strict control of hypertension with blood pressure goal below 130/90, diabetes with hemoglobin A1c goal below 7% and cholesterol with LDL cholesterol (bad cholesterol) goal below 70 mg/dL.       Followup in the future with me in 6 months or call earlier if needed       Thank you for coming to see [place] at [facility]. I hope we have been able to provide you high quality care today.  You may receive a patient satisfaction survey over the next few weeks. We would appreciate your feedback and comments so that we may continue to improve ourselves and the health of our patients.

## 2020-04-30 NOTE — Progress Notes (Signed)
I agree with the above plan 

## 2020-05-11 ENCOUNTER — Encounter: Payer: Self-pay | Admitting: Cardiovascular Disease

## 2020-05-11 LAB — HEMOGLOBIN A1C: Hemoglobin A1C: 5.3

## 2020-05-17 ENCOUNTER — Telehealth: Payer: Self-pay | Admitting: Cardiovascular Disease

## 2020-05-18 NOTE — Telephone Encounter (Signed)
Please advise if OK to refill. Per [name] last office note on 04/10/20: We will hold off on spironolactone restart given renal dysfunction potential for high potassium  Pharmacy and patient requesting refill of this medication. Thank you!

## 2020-05-18 NOTE — Telephone Encounter (Signed)
Mr. Webb Silversmith had also sent in a MyChart message regarding this medication, advised cannot refill at this time until we have updated labs with potassium levels and BP readings, all is based on [name] progress notes.  No refills at this time Thanks [facility]

## 2020-05-23 NOTE — Telephone Encounter (Signed)
Patient wife  calling to check status of refill.  Wife states blood work done at endocrinology at [facility] .    Requested results via fax from [facility] for review.    Per wife please call with update.

## 2020-05-24 NOTE — Telephone Encounter (Signed)
Pt reached out via Mychart, responded to pt regarding his spironolactone, reached out to Gillian Shields who stated   "Looks like at recent clinic visits with endocrinology, etc his BP has been elevated. He also sent a MyChart message about a month ago noting weight gain but declined to be seen by an APP and cancelled that appointment. His EF on most recent echocardiogram was normal which means a diuretic such as Spironolactone is likely not the best agent. It would be best for him to have an appointment with Dr. Mariah Milling to assess medications and blood pressure".  Pt needs an appt to be evaluated in order to have his medication refill, advised to call the office to schedule, APP is okay as well.

## 2020-05-26 ENCOUNTER — Ambulatory Visit (INDEPENDENT_AMBULATORY_CARE_PROVIDER_SITE_OTHER): Payer: Medicare Other | Admitting: Family

## 2020-05-26 ENCOUNTER — Encounter: Payer: Self-pay | Admitting: Family

## 2020-05-26 ENCOUNTER — Other Ambulatory Visit: Payer: Self-pay

## 2020-05-26 VITALS — BP 110/88 | HR 82 | Ht 67.0 in | Wt 199.0 lb

## 2020-05-26 DIAGNOSIS — N1832 Chronic kidney disease, stage 3b: Secondary | ICD-10-CM

## 2020-05-26 DIAGNOSIS — I110 Hypertensive heart disease with heart failure: Secondary | ICD-10-CM | POA: Diagnosis not present

## 2020-05-26 DIAGNOSIS — R42 Dizziness and giddiness: Secondary | ICD-10-CM | POA: Diagnosis not present

## 2020-05-26 DIAGNOSIS — I10 Essential (primary) hypertension: Secondary | ICD-10-CM | POA: Diagnosis not present

## 2020-05-26 MED ORDER — AMLODIPINE BESYLATE 5 MG PO TABS: 5.0000 mg | ORAL_TABLET | Freq: Every day | ORAL | 1 refills | Status: DC

## 2020-05-26 NOTE — Patient Instructions (Addendum)
Medication Instructions:  Your physician has recommended you make the following change in your medication:   STOP Spironolactone  CHANGE Amlodipine to 5mg  daily  CONTINUE Plavix, Carvedilol  *If you need a refill on your cardiac medications before your next appointment, please call your pharmacy*   Lab Work: None ordered today  Testing/Procedures: None ordered today  Follow-Up: At [facility], you and your health needs are our priority.  As part of our continuing mission to provide you with exceptional heart care, we have created designated Provider Care Teams.  These Care Teams include your primary Cardiologist (physician) and Advanced Practice Providers (APPs -  Physician Assistants and Nurse Practitioners) who all work together to provide you with the care you need, when you need it.  We recommend signing up for the patient portal called "MyChart".  Sign up information is provided on this After Visit Summary.  MyChart is used to connect with patients for Virtual Visits (Telemedicine).  Patients are able to view lab/test results, encounter notes, upcoming appointments, etc.  Non-urgent messages can be sent to your provider as well.   To learn more about what you can do with MyChart, go to [facility].    Your next appointment:   4 month(s)  The format for your next appointment:   In Person  Provider:   You may see [email or web address], MD or one of the following Advanced Practice Providers on your designated Care Team:    Julien Nordmann, NP  Nicolasa Ducking, PA-C  Eula Listen, PA-C  Cadence [place], [place]  [place], NP  Other Instructions  If your blood pressure on the top is consistently less than 120, please contact our office as we may change your blood pressure medications.

## 2020-05-26 NOTE — Progress Notes (Signed)
Office Visit    Patient Name: Joel [name] Date of Encounter: 05/26/2020  PCP:  [name], DO   [facility]  Cardiologist:  Julien Nordmann, MD  Advanced Practice Provider:  No care team member to display Electrophysiologist:  None    Chief Complaint    Joel [name] is a 40 y.o. male with a hx of nonischemic cardiomyopathy secondary to hypertensive heart disease with recovered LVEF, CKD III-IV, DM2, HLD, obesity, sleep apnea on CPAP presents today for discussion regarding antihypertensive therapy.   Past Medical History    Past Medical History:  Diagnosis Date  . Anxiety   . Chronic diastolic CHF (congestive heart failure) (HCC)   . Hyperlipidemia   . Hypertension   . Hypertensive heart disease   . Noncompliance with medications   . Obesity   . Renal disorder   . Stroke (HCC)   . Systolic dysfunction    a. TTE 06/2014: EF 45-50%, normal wall motion, mild MR, mild AI   Past Surgical History:  Procedure Laterality Date  . NO PAST SURGERIES      Allergies  Allergies  Allergen Reactions  . Bidil [Isosorb Dinitrate-Hydralazine] Other (See Comments)    Severe headache  . Nitroglycerin Other (See Comments)    Causes severe headaches    History of Present Illness    Joel [name] is a 40 y.o. male with a hx of nonischemic cardiomyopathy secondary to hypertensive heart disease with recovered LVEF, CVA 02/2019, CKD III-IV, DM2, HLD, obesity, sleep apnea on CPAP last seen 04/10/20.  Previous echocardiogram 02/12/17 LVEF 25-30%. He was admitted 03/2019 with stroke. Echo at that time with LVEF 55-60%, severe LVH, gr1DD, no RWMA, bilateral atria normal in size, no significant valvular abnormalities. Transcranial doppler was negative.   He was seen most recently 04/10/20 noting to have stopped all of his medications including diabetes and cholesterol medications as he felt he could fix everything by losing weight. He had successfully lost  weight BP remained markedly elevated. Resuming Entresto, ACE, ARB, Spironolactone were deferred due to CKD and potential for hyperkalemia. He has previously been intolerant of Bidil and isosorbide.   Labs via Care Everywhere 05/11/20:  TSH 1.07, A1c 5.3  He presents today for follow up with his wife after sending a MyChart message requesting Spironolactone refill which he had self-resumed. He has been out for two days. Reports no shortness of breath nor dyspnea on exertion. Reports no chest pain, pressure, or tightness. No edema, orthopnea, PND. Reports no palpitations. Reports lightheadedness but not dizziness. Tells me after he first takes his medications in the morning it is most noticeable. No near syncope nor syncope. Goes to the gym in the morning about 4-5 times per week. Endorses eating heart healthy diet. He has lost an additional 25 pounds since seen 6.5 weeks ago in our clinic. His highest documented weight was 340 pounds with total weight loss of 141 pounds.   EKGs/Labs/Other Studies Reviewed:   The following studies were reviewed today:   Transcranial Doppler 03/22/19 A vascular evaluation was performed. The left Terminal ICA was studied. An  IV was inserted into the patient's left Forearm. Verbal informed consent  was obtained.    No evidence of HITS (high intensity transient signals) at rest or with  Valsalva. Therefore, there is no evidence of clinically significant PFO  (patent foramen ovale).    Negative TCD Bubble study  *See table(s) above for TCD measurements and observations.  Echo 03/20/19 1. Left ventricular ejection fraction, by visual estimation, is 55 to  60%. The left ventricle has normal function. There is severely increased  left ventricular hypertrophy.   2. Left ventricular diastolic parameters are consistent with Grade I  diastolic dysfunction (impaired relaxation).   3. The left ventricle has no regional wall motion abnormalities.   4. Global right  ventricle has normal systolic function.The right  ventricular size is normal. No increase in right ventricular wall  thickness.   5. Left atrial size was normal.   6. Right atrial size was normal.   7. The mitral valve is grossly normal. No evidence of mitral valve  regurgitation.   8. The tricuspid valve is grossly normal.   9. The tricuspid valve is grossly normal. Tricuspid valve regurgitation  is mild.  10. The aortic valve is tricuspid. Aortic valve regurgitation is not  visualized. Mild aortic valve sclerosis without stenosis.  11. The pulmonic valve was grossly normal. Pulmonic valve regurgitation is  not visualized.  12. The inferior vena cava is normal in size with greater than 50%  respiratory variability, suggesting right atrial pressure of 3 mmHg.   Echo 02/2017 Left ventricle: The cavity size was normal. There was moderate   concentric hypertrophy. Systolic function was severely reduced.   The estimated ejection fraction was in the range of 25% to 30%.   Diffuse hypokinesis. Regional wall motion abnormalities cannot be   excluded. Features are consistent with a pseudonormal left   ventricular filling pattern, with concomitant abnormal relaxation   and increased filling pressure (grade 2 diastolic dysfunction).   EKG:  No EKG today.  Recent Labs: No results found for requested labs within last 8760 hours.  Recent Lipid Panel    Component Value Date/Time   CHOL 264 (H) 03/20/2019 0113   CHOL 361 (H) 06/11/2014 1232   TRIG 162 (H) 03/20/2019 0113   TRIG 246 (H) 06/11/2014 1232   HDL 21 (L) 03/20/2019 0113   HDL 41 06/11/2014 1232   CHOLHDL 12.6 03/20/2019 0113   VLDL 32 03/20/2019 0113   VLDL 49 (H) 06/11/2014 1232   LDLCALC 211 (H) 03/20/2019 0113   LDLCALC 271 (H) 06/11/2014 1232    Home Medications   Current Meds  Medication Sig  . amLODipine (NORVASC) 5 MG tablet Take 1 tablet (5 mg total) by mouth daily.  . carvedilol (COREG) 12.5 MG tablet Take 12.5  mg by mouth 2 (two) times daily with a meal.  . clopidogrel (PLAVIX) 75 MG tablet Take 1 tablet (75 mg total) by mouth daily.  . rosuvastatin (CRESTOR) 40 MG tablet Take 40 mg by mouth daily.  . [DISCONTINUED] amLODipine (NORVASC) 10 MG tablet Take 1 tablet by mouth daily.     Review of Systems  All other systems reviewed and are otherwise negative except as noted above.  Physical Exam    VS:  BP 110/88 (BP Location: Left Arm, Patient Position: Sitting, Cuff Size: Normal)   Pulse 82   Ht 5\' 7"  (1.702 m)   Wt 199 lb (90.3 kg)   SpO2 96%   BMI 31.17 kg/m  , BMI Body mass index is 31.17 kg/m.  Wt Readings from Last 3 Encounters:  05/26/20 199 lb (90.3 kg)  04/24/20 220 lb (99.8 kg)  04/10/20 224 lb (101.6 kg)    GEN: Well nourished, well developed, in no acute distress. HEENT: normal. Neck: Supple, no JVD, carotid bruits, or masses. Cardiac: RRR, no murmurs, rubs, or gallops. No  clubbing, cyanosis, edema.  Radials/PT 2+ and equal bilaterally.  Respiratory:  Respirations regular and unlabored, clear to auscultation bilaterally. GI: Soft, nontender, nondistended. MS: No deformity or atrophy. Skin: Warm and dry, no rash. Neuro:  Strength and sensation are intact. Psych: Normal affect.  Assessment & Plan    1. Hypertensive heart disease with systolic heart failure and recovered LVEF / Lightheadedness - Echo 03/2019 with normal LVEF. BP control markedly improved with weight loss.  Reports some lightheadedness consistent with hypotension.  Do not recommend resumption of spironolactone due to renal function, hypotension.  Reduce amlodipine to 5 mg daily.  Continue Coreg 12.5 mg twice daily.  He will report blood pressure readings consistently less than 120.  2. History of CVA - Continue to follow with neurology. Continue Plavix, Crestor.  3. Diabetes - 05/10/20 A1c 5.3 off all diabetes medications. Diabetes presently controlled with weight loss, diet, exercise.   4. CKD - Careful  titration of antihypertensive agents.   Disposition: Follow up in August as scheduled with Dr. Mariah Milling.   Signed, Alver Sorrow, NP 05/26/2020, 9:05 AM [facility]

## 2020-05-28 NOTE — Telephone Encounter (Signed)
Too many messages floating around on his BP He stopped everything on his own on last clinic visit with me thought he could fix it with diet Would restart coreg and amlodipine as he was taking before stopping everything Skip spironolactone

## 2020-05-28 NOTE — Progress Notes (Signed)
Disregard prior note He should follow instruction as detailed below by CW

## 2020-05-29 ENCOUNTER — Telehealth: Payer: Self-pay | Admitting: Cardiovascular Disease

## 2020-05-29 NOTE — Telephone Encounter (Signed)
I attempted to contact patient back to discuss if he knew what the reason for calling was for.  Reviewed chart, but could not see any reason for a call. No upcoming appointment.  I did advise patient I would route to Dr.Gollan's nurse to see if she was aware.  Patient verbalized understanding.

## 2020-05-29 NOTE — Telephone Encounter (Signed)
Patient returning call.

## 2020-05-29 NOTE — Telephone Encounter (Signed)
Called patient back and advised that Dr.Gollan's nurse was unaware.  Patient will let us know if he has any other calls.

## 2020-05-30 DIAGNOSIS — E1121 Type 2 diabetes mellitus with diabetic nephropathy: Secondary | ICD-10-CM

## 2020-05-30 DIAGNOSIS — Z794 Long term (current) use of insulin: Secondary | ICD-10-CM

## 2020-05-31 NOTE — Addendum Note (Signed)
Addended by: [name] on: 05/31/2020 04:32 PM   Modules accepted: Orders

## 2020-06-07 ENCOUNTER — Ambulatory Visit (INDEPENDENT_AMBULATORY_CARE_PROVIDER_SITE_OTHER): Payer: Medicare Other

## 2020-06-07 ENCOUNTER — Other Ambulatory Visit: Payer: Self-pay

## 2020-06-07 DIAGNOSIS — G4733 Obstructive sleep apnea (adult) (pediatric): Secondary | ICD-10-CM | POA: Diagnosis not present

## 2020-06-07 NOTE — Progress Notes (Signed)
   95 percentile pressure 8.9   95th percentile leak 13.7   apnea index 0.3 /hr  apnea-hypopnea index  0.5 /hr   total days used  >4 hr 87 days  total days used <4 hr 3 days  Total compliance 97 percent  He is doing great on cpap and with weight loss no problems or questions at this time

## 2020-06-09 LAB — HM DIABETES EYE EXAM

## 2020-06-12 ENCOUNTER — Other Ambulatory Visit: Payer: Self-pay

## 2020-06-12 ENCOUNTER — Ambulatory Visit (INDEPENDENT_AMBULATORY_CARE_PROVIDER_SITE_OTHER): Payer: Medicare Other | Admitting: Family Medicine

## 2020-06-12 ENCOUNTER — Encounter: Payer: Self-pay | Admitting: Family Medicine

## 2020-06-12 VITALS — BP 134/88 | HR 94 | Ht 67.0 in | Wt 202.6 lb

## 2020-06-12 DIAGNOSIS — M25451 Effusion, right hip: Secondary | ICD-10-CM

## 2020-06-12 NOTE — Progress Notes (Signed)
Subjective:    Patient ID: Joel [name], male    DOB: 19-Nov-1980, 40 y.o.   MRN: [id number]  Joel [name] is a 40 y.o. male presenting on 06/12/2020 for lump on hip  Patient presents for a same day appointment.  HPI   Right Hip Swelling Last night identified large swollen mass on side of R hip He tried some Ibuprofen, topical salnpas, manual massage, and massage gun to help it, gradually it improved. Now this AM, 90% swelling has improved, and pain has dramatically improve. No new problem or injury to trigger this He is not feeling pain in his actual hip joint and can move without any problem. Denies any new symptoms numbness tingling weakness, no fall or other injury  Obesity He has lost significant weight at this point. Doing wel    Depression screen [facility] 2/9 06/04/2019 04/21/2019 03/03/2019  Decreased Interest 0 2 1  Down, Depressed, Hopeless 0 2 2  PHQ - 2 Score 0 4 3  Altered sleeping - 2 1  Tired, decreased energy - 3 3  Change in appetite - 3 3  Feeling bad or failure about yourself  - 2 3  Trouble concentrating - 2 2  Moving slowly or fidgety/restless - 2 1  Suicidal thoughts - 1 2  PHQ-9 Score - 19 18  Difficult doing work/chores - Extremely dIfficult Somewhat difficult  Some recent data might be hidden    Social History   Tobacco Use  . Smoking status: Never Smoker  . Smokeless tobacco: Never Used  Vaping Use  . Vaping Use: Never used  Substance Use Topics  . Alcohol use: Not Currently    Comment: Very infrequently; ~2-3 drinks/year   . Drug use: No    Review of Systems Per HPI unless specifically indicated above     Objective:    BP 134/88 (BP Location: Left Arm, Cuff Size: Normal)   Pulse 94   Ht 5\' 7"  (1.702 m)   Wt 202 lb 9.6 oz (91.9 kg)   SpO2 99%   BMI 31.73 kg/m   Wt Readings from Last 3 Encounters:  06/12/20 202 lb 9.6 oz (91.9 kg)  05/26/20 199 lb (90.3 kg)  04/24/20 220 lb (99.8 kg)    Physical Exam Vitals and nursing note  reviewed.  Constitutional:      General: He is not in acute distress.    Appearance: He is well-developed. He is not diaphoretic.     Comments: Well-appearing, comfortable, cooperative  HENT:     Head: Normocephalic and atraumatic.  Eyes:     General:        Right eye: No discharge.        Left eye: No discharge.     Conjunctiva/sclera: Conjunctivae normal.  Cardiovascular:     Rate and Rhythm: Normal rate.  Pulmonary:     Effort: Pulmonary effort is normal.  Musculoskeletal:     Right lower leg: No edema.     Left lower leg: No edema.  Skin:    General: Skin is warm and dry.     Findings: No erythema or rash.  Neurological:     Mental Status: He is alert and oriented to person, place, and time.  Psychiatric:        Behavior: Behavior normal.     Comments: Well groomed, good eye contact, normal speech and thoughts       Results for orders placed or performed in visit on 06/21/19  Antiphospholipid syndrome  eval, bld  Result Value Ref Range   APTT PPP 30.1 22.9 - 30.2 sec   PT 11.3 9.1 - 12.0 sec   INR 1.1 0.9 - 1.2   Thrombin Time 20.3 0.0 - 23.0 sec   Dilute Viper Venom Time 32.2 0.0 - 47.0 sec   Hexagonal Phase Phospholipid 0 0 - 11 sec   Anticardiolipin IgG <9 0 - 14 GPL U/mL   Anticardiolipin IgM 11 0 - 12 MPL U/mL   Beta-2 Glyco I IgG <9 0 - 20 GPI IgG units   Beta-2 Glyco 1 IgM <9 0 - 32 GPI IgM units   APS Panel Interpretation Comment   Beta-2-glycoprotein i abs, IgG/M/A  Result Value Ref Range   Beta-2 Glyco 1 IgA <9 0 - 25 GPI IgA units  Coag Studies Interp Report  Result Value Ref Range   Interpretation Note       Assessment & Plan:   Problem List Items Addressed This Visit   None   Visit Diagnoses    Hip swelling, right    -  Primary      Nearly resolved - R sided hip area swelling Not actually over lower greater trochanter or hip joint This appears to have been in area of soft tissue He has some excess redundant tissue in this area from  dramatic weight loss, may be swelling associated with that perhaps. It responded well to topical and manual treatment Reassurance today No imaging or other therapy  Future may follow-up sooner or again if recurrence, we can refer to Sports med doctor [facility] for considering a [facility] ULtrasound.  No orders of the defined types were placed in this encounter.     Follow up plan: Return in about 4 months (around 10/13/2020) for 4 month follow-up Updates specialists.   Saralyn Pilar, DO [facility] 06/12/2020, 1:27 PM

## 2020-06-12 NOTE — Patient Instructions (Addendum)
Thank you for coming to the office today.  Last weights our scale at 256 lbs in 01/2020, down to 202 lbs today  Consider ibuprofen short term only for swelling 200-400mg  as needed up to max 3 times a day.  Voltaren (Diclofenac) topical anti inflammatory, instead of ibuprofen in future. Can use this up to 4 times a day as needed.  Keep up the good work if another flare up occurs, can do the same treatment and let me know we can always refer to a Sports Med doc who can use an ultrasound machine to identify the source and learn more about it.  Most likely this was from a soft tissue swelling above the hip, can be muscle or just fluid collection. Unlikely to be the hip.  Please schedule a Follow-up Appointment to: Return in about 4 months (around 10/13/2020) for 4 month follow-up Updates specialists.  If you have any other questions or concerns, please feel free to call the office or send a message through MyChart. You may also schedule an earlier appointment if necessary.  Additionally, you may be receiving a survey about your experience at our office within a few days to 1 week by e-mail or mail. We value your feedback.  Saralyn Pilar, DO [facility], [place]

## 2020-06-15 ENCOUNTER — Ambulatory Visit (INDEPENDENT_AMBULATORY_CARE_PROVIDER_SITE_OTHER): Payer: Medicare Other | Admitting: Internal Medicine

## 2020-06-15 ENCOUNTER — Encounter: Payer: Self-pay | Admitting: Internal Medicine

## 2020-06-15 ENCOUNTER — Other Ambulatory Visit: Payer: Self-pay

## 2020-06-15 VITALS — BP 130/88 | HR 88 | Temp 97.8°F | Resp 16 | Ht 67.0 in | Wt 201.0 lb

## 2020-06-15 DIAGNOSIS — I639 Cerebral infarction, unspecified: Secondary | ICD-10-CM | POA: Diagnosis not present

## 2020-06-15 DIAGNOSIS — G4733 Obstructive sleep apnea (adult) (pediatric): Secondary | ICD-10-CM | POA: Diagnosis not present

## 2020-06-15 DIAGNOSIS — I5022 Chronic systolic (congestive) heart failure: Secondary | ICD-10-CM

## 2020-06-15 NOTE — Patient Instructions (Signed)

## 2020-06-15 NOTE — Progress Notes (Signed)
[facility] [address] [place], [place] [address]  Pulmonary Sleep Medicine   Office Visit Note  Patient Name: Joel [name] DOB: 27-Jun-1980 MRN [id number]  Date of Service: 06/15/2020  Complaints/HPI: OSA has been doing well. With the sleep. He is not snoring and has been feeling better in the daytime with less sleepiness.  He denies having any chest pain no palpitations.  Denies any nausea no vomiting no fevers are noted.  The patient has had no cough no congestion.  He continues to lose weight parent states that he is now down 150 pounds patient has been watching his diet also very closely.  He states that his cholesterol has been an issue.  Other issue he notes his memory problems since his stroke.  He is going to discuss his medications etc. with his primary care provider.  ROS  General: (-) fever, (-) chills, (-) night sweats, (-) weakness Skin: (-) rashes, (-) itching,. Eyes: (-) visual changes, (-) redness, (-) itching. Nose and Sinuses: (-) nasal stuffiness or itchiness, (-) postnasal drip, (-) nosebleeds, (-) sinus trouble. Mouth and Throat: (-) sore throat, (-) hoarseness. Neck: (-) swollen glands, (-) enlarged thyroid, (-) neck pain. Respiratory: - cough, (-) bloody sputum, - shortness of breath, - wheezing. Cardiovascular: - ankle swelling, (-) chest pain. Lymphatic: (-) lymph node enlargement. Neurologic: (-) numbness, (-) tingling. Psychiatric: (-) anxiety, (-) depression   Current Medication: Outpatient Encounter Medications as of 06/15/2020  Medication Sig  . amLODipine (NORVASC) 5 MG tablet Take 1 tablet (5 mg total) by mouth daily.  . carvedilol (COREG) 12.5 MG tablet Take 12.5 mg by mouth 2 (two) times daily with a meal.  . clopidogrel (PLAVIX) 75 MG tablet Take 1 tablet (75 mg total) by mouth daily.  . rosuvastatin (CRESTOR) 40 MG tablet Take 40 mg by mouth daily.   No facility-administered encounter medications on file as of 06/15/2020.     Surgical History: Past Surgical History:  Procedure Laterality Date  . NO PAST SURGERIES      Medical History: Past Medical History:  Diagnosis Date  . Anxiety   . Chronic diastolic CHF (congestive heart failure) (HCC)   . Hyperlipidemia   . Hypertension   . Hypertensive heart disease   . Noncompliance with medications   . Obesity   . Renal disorder   . Stroke (HCC)   . Systolic dysfunction    a. TTE 06/2014: EF 45-50%, normal wall motion, mild MR, mild AI    Family History: Family History  Problem Relation Age of Onset  . Hypertension Mother   . Hyperlipidemia Mother   . Diabetes Mother   . CAD Father        a. stenting in his early 46s  . Heart disease Father   . Stroke Father   . Heart attack Father   . Diabetes Maternal Grandmother   . Diabetes Maternal Grandfather   . CAD Paternal Grandmother   . CAD Paternal Grandfather   . Colon cancer Paternal Grandfather   . Diabetes Paternal Uncle     Social History: Social History   Socioeconomic History  . Marital status: Married    Spouse name: Not on file  . Number of children: 1  . Years of education: 18  . Highest education level: 12th grade  Occupational History  . Not on file  Tobacco Use  . Smoking status: Never Smoker  . Smokeless tobacco: Never Used  Vaping Use  . Vaping Use: Never used  Substance and Sexual Activity  . Alcohol use: Not Currently    Comment: Very infrequently; ~2-3 drinks/year   . Drug use: No  . Sexual activity: Yes    Birth control/protection: None  Other Topics Concern  . Not on file  Social History Narrative  . Not on file   Social Determinants of Health   Financial Resource Strain: Not on file  Food Insecurity: Not on file  Transportation Needs: Not on file  Physical Activity: Not on file  Stress: Not on file  Social Connections: Not on file  Intimate Partner Violence: Not on file    Vital Signs: Blood pressure 130/88, pulse 88, temperature 97.8 F (36.6  C), resp. rate 16, height 5\' 7"  (1.702 m), weight 201 lb (91.2 kg), SpO2 97 %.  Examination: General Appearance: The patient is well-developed, well-nourished, and in no distress. Skin: Gross inspection of skin unremarkable. Head: normocephalic, no gross deformities. Eyes: no gross deformities noted. ENT: ears appear grossly normal no exudates. Neck: Supple. No thyromegaly. No LAD. Respiratory: no rhonchi noted at this time. Cardiovascular: Normal S1 and S2 without murmur or rub. Extremities: No cyanosis. pulses are equal. Neurologic: Alert and oriented. No involuntary movements.  LABS: No results found for this or any previous visit (from the past 2160 hour(s)).  Radiology: DG Abd 1 View  Result Date: 03/29/2019 CLINICAL DATA:  Constipation, nausea EXAM: ABDOMEN - 1 VIEW COMPARISON:  None. FINDINGS: Nonobstructive bowel gas pattern. Mild to moderate colonic stool burden. IMPRESSION: Mild to moderate colonic stool burden, suggesting mild constipation. Electronically Signed   By: 03/31/2019 M.D.   On: 03/29/2019 16:27    No results found.  No results found.    Assessment and Plan: Patient Active Problem List   Diagnosis Date Noted  . Morbid obesity with BMI of 40.0-44.9, adult (HCC) 04/27/2019  . Orthostatic hypotension 04/14/2019  . Acute CVA (cerebrovascular accident) (HCC) 03/19/2019  . Depression, major, recurrent, moderate (HCC) 12/19/2017  . Fatigue 04/07/2017  . Dilated cardiomyopathy (HCC) 02/23/2017  . Hypertensive heart disease with heart failure (HCC) 02/23/2017  . Essential hypertension 02/21/2017  . Type 2 diabetes mellitus with diabetic nephropathy (HCC) 02/21/2017  . H/O medication noncompliance 02/12/2017  . Chronic kidney disease (CKD), stage III (moderate) 02/12/2017  . Systolic CHF, chronic (HCC) 10/11/2014    1. Obstructive sleep apnea CPAP Counseling: had a lengthy discussion with the patient regarding the importance of PAP therapy in  management of the sleep apnea. Patient appears to understand the risk factor reduction and also understands the risks associated with untreated sleep apnea. Patient will try to make a good faith effort to remain compliant with therapy. Also instructed the patient on proper cleaning of the device including the water must be changed daily if possible and use of distilled water is preferred. Patient understands that the machine should be regularly cleaned with appropriate recommended cleaning solutions that do not damage the PAP machine for example given white vinegar and water rinses. Other methods such as ozone treatment may not be as good as these simple methods to achieve cleaning.  2. Systolic CHF, chronic (HCC) Compensated right now continue to follow salt restriction as well as water intake and fluid status.  3. Cerebrovascular accident (CVA), unspecified mechanism (HCC) No further events from neurological perspective we will continue to monitor closely.  4. Morbid obesity (HCC) Obesity Counseling: Had a lengthy discussion regarding patients BMI and weight issues. Patient was instructed on portion control as well as increased activity.  Also discussed caloric restrictions with trying to maintain intake less than 2000 Kcal. Discussions were made in accordance with the 5As of weight management. Simple actions such as not eating late and if able to, taking a walk is suggested.   General Counseling: I have discussed the findings of the evaluation and examination with Arlys John.  I have also discussed any further diagnostic evaluation thatmay be needed or ordered today. Kaspar verbalizes understanding of the findings of todays visit. We also reviewed his medications today and discussed drug interactions and side effects including but not limited excessive drowsiness and altered mental states. We also discussed that there is always a risk not just to him but also people around him. he has been encouraged to call  the office with any questions or concerns that should arise related to todays visit.  No orders of the defined types were placed in this encounter.    Time spent: 17  I have personally obtained a history, examined the patient, evaluated laboratory and imaging results, formulated the assessment and plan and placed orders.    Yevonne Pax, MD [facility] Critical [facility] Sleep medicine

## 2020-06-20 ENCOUNTER — Encounter: Payer: Self-pay | Admitting: Family Medicine

## 2020-06-22 ENCOUNTER — Telehealth: Payer: Self-pay

## 2020-06-22 NOTE — Telephone Encounter (Signed)
Cpap supply order signed by provider and given to [facility] with [employer]. Toni Amend

## 2020-07-25 ENCOUNTER — Other Ambulatory Visit: Payer: Self-pay

## 2020-07-25 ENCOUNTER — Other Ambulatory Visit (INDEPENDENT_AMBULATORY_CARE_PROVIDER_SITE_OTHER): Payer: Self-pay | Admitting: Family Medicine

## 2020-07-25 ENCOUNTER — Encounter (INDEPENDENT_AMBULATORY_CARE_PROVIDER_SITE_OTHER): Payer: Self-pay | Admitting: Family Medicine

## 2020-07-25 ENCOUNTER — Ambulatory Visit (INDEPENDENT_AMBULATORY_CARE_PROVIDER_SITE_OTHER): Payer: Medicare Other | Admitting: Family Medicine

## 2020-07-25 VITALS — BP 136/92 | Ht 67.0 in | Wt 211.0 lb

## 2020-07-25 DIAGNOSIS — I639 Cerebral infarction, unspecified: Secondary | ICD-10-CM | POA: Diagnosis not present

## 2020-07-25 DIAGNOSIS — I110 Hypertensive heart disease with heart failure: Secondary | ICD-10-CM | POA: Diagnosis not present

## 2020-07-25 DIAGNOSIS — Z0289 Encounter for other administrative examinations: Secondary | ICD-10-CM

## 2020-07-25 DIAGNOSIS — Z6833 Body mass index (BMI) 33.0-33.9, adult: Secondary | ICD-10-CM | POA: Diagnosis not present

## 2020-07-25 DIAGNOSIS — G4733 Obstructive sleep apnea (adult) (pediatric): Secondary | ICD-10-CM

## 2020-07-25 DIAGNOSIS — F4310 Post-traumatic stress disorder, unspecified: Secondary | ICD-10-CM

## 2020-07-25 DIAGNOSIS — R5383 Other fatigue: Secondary | ICD-10-CM | POA: Diagnosis not present

## 2020-07-25 DIAGNOSIS — E1169 Type 2 diabetes mellitus with other specified complication: Secondary | ICD-10-CM | POA: Diagnosis not present

## 2020-07-25 DIAGNOSIS — R0602 Shortness of breath: Secondary | ICD-10-CM | POA: Diagnosis not present

## 2020-07-25 DIAGNOSIS — E785 Hyperlipidemia, unspecified: Secondary | ICD-10-CM

## 2020-07-25 DIAGNOSIS — E669 Obesity, unspecified: Secondary | ICD-10-CM

## 2020-07-25 DIAGNOSIS — F411 Generalized anxiety disorder: Secondary | ICD-10-CM

## 2020-07-25 DIAGNOSIS — E1121 Type 2 diabetes mellitus with diabetic nephropathy: Secondary | ICD-10-CM

## 2020-07-25 DIAGNOSIS — F331 Major depressive disorder, recurrent, moderate: Secondary | ICD-10-CM

## 2020-07-25 DIAGNOSIS — F5081 Binge eating disorder: Secondary | ICD-10-CM

## 2020-07-25 DIAGNOSIS — F3289 Other specified depressive episodes: Secondary | ICD-10-CM
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
## 2020-07-26 LAB — COMPREHENSIVE METABOLIC PANEL
ALT: 16 IU/L (ref 0–44)
AST: 17 IU/L (ref 0–40)
Albumin/Globulin Ratio: 1.7 (ref 1.2–2.2)
Albumin: 4.5 g/dL (ref 4.0–5.0)
Alkaline Phosphatase: 77 IU/L (ref 44–121)
BUN/Creatinine Ratio: 14 (ref 9–20)
BUN: 22 mg/dL — ABNORMAL HIGH (ref 6–20)
Bilirubin Total: 0.5 mg/dL (ref 0.0–1.2)
CO2: 24 mmol/L (ref 20–29)
Calcium: 9.2 mg/dL (ref 8.7–10.2)
Chloride: 100 mmol/L (ref 96–106)
Creatinine, Ser: 1.59 mg/dL — ABNORMAL HIGH (ref 0.76–1.27)
Globulin, Total: 2.6 g/dL (ref 1.5–4.5)
Glucose: 107 mg/dL — ABNORMAL HIGH (ref 65–99)
Potassium: 4 mmol/L (ref 3.5–5.2)
Sodium: 141 mmol/L (ref 134–144)
Total Protein: 7.1 g/dL (ref 6.0–8.5)
eGFR: 57 mL/min/{1.73_m2} — ABNORMAL LOW (ref >59)

## 2020-07-26 LAB — CBC WITH DIFFERENTIAL/PLATELET
Basophils Absolute: 0 10*3/uL (ref 0.0–0.2)
Basos: 1 %
EOS (ABSOLUTE): 0.2 10*3/uL (ref 0.0–0.4)
Eos: 4 %
Hematocrit: 44 % (ref 37.5–51.0)
Hemoglobin: 14.6 g/dL (ref 13.0–17.7)
Immature Grans (Abs): 0 10*3/uL (ref 0.0–0.1)
Immature Granulocytes: 0 %
Lymphocytes Absolute: 1.9 10*3/uL (ref 0.7–3.1)
Lymphs: 43 %
MCH: 28.3 pg (ref 26.6–33.0)
MCHC: 33.2 g/dL (ref 31.5–35.7)
MCV: 85 fL (ref 79–97)
Monocytes Absolute: 0.3 10*3/uL (ref 0.1–0.9)
Monocytes: 7 %
Neutrophils Absolute: 1.9 10*3/uL (ref 1.4–7.0)
Neutrophils: 45 %
Platelets: 224 10*3/uL (ref 150–450)
RBC: 5.15 x10E6/uL (ref 4.14–5.80)
RDW: 14.6 % (ref 11.6–15.4)
WBC: 4.3 10*3/uL (ref 3.4–10.8)

## 2020-07-26 LAB — T4, FREE: Free T4: 0.92 ng/dL (ref 0.82–1.77)

## 2020-07-26 LAB — LIPID PANEL WITH LDL/HDL RATIO
Cholesterol, Total: 221 mg/dL — ABNORMAL HIGH (ref 100–199)
HDL: 59 mg/dL (ref >39)
LDL Chol Calc (NIH): 150 mg/dL — ABNORMAL HIGH (ref 0–99)
LDL/HDL Ratio: 2.5 ratio (ref 0.0–3.6)
Triglycerides: 69 mg/dL (ref 0–149)
VLDL Cholesterol Cal: 12 mg/dL (ref 5–40)

## 2020-07-26 LAB — INSULIN, RANDOM: INSULIN: 10.5 u[IU]/mL (ref 2.6–24.9)

## 2020-07-26 LAB — TSH: TSH: 1.98 u[IU]/mL (ref 0.450–4.500)

## 2020-07-28 ENCOUNTER — Encounter: Payer: Self-pay | Admitting: Adult Health

## 2020-07-28 NOTE — Addendum Note (Signed)
Addended by: [name] on: 07/28/2020 06:25 PM   Modules accepted: Orders

## 2020-07-31 NOTE — Telephone Encounter (Signed)
Was this just a one-time event or has been occurring multiple times?  When did this symptom first present?

## 2020-08-01 NOTE — Progress Notes (Signed)
Dear Dr. Althea Charon,   Thank you for referring Joel [name] to our clinic. The following note includes my evaluation and treatment recommendations.    Chief Complaint:   OBESITY Joel [name] (MR# [id number]) is a 40 y.o. male who presents for evaluation and treatment of obesity and related comorbidities. Current BMI is Body mass index is 33.05 kg/m. Joel [name] has been struggling with his weight for many years and has been unsuccessful in either losing weight, maintaining weight loss, or reaching his healthy weight goal.  Joel [name] is currently in the action stage of change and ready to dedicate time achieving and maintaining a healthier weight. Joel [name] is interested in becoming our patient and working on intensive lifestyle modifications including (but not limited to) diet and exercise for weight loss.  Joel [name] is disabled.  He is married to, and lives with, his wife, Joel [name] (72), and their 87 year old daughter.  He wants to lose 40 pounds in 4 months.  He says he lost the most weight in the past with the "starvation diet".  He was 343 pounds and lost with "low carb and starving".  He is lactose intolerant but can consume dairy products.  He craves wings, chips, pizza, sweets (candy).  Drinks a lot of caloric beverages.  Worst habit is fluctuating between starving and binge eating.  [name]'s habits were reviewed today and are as follows: he thinks his family will eat healthier with him, his desired weight loss is 34 lbs, he has been heavy most of his life, he started gaining excess weight in 2007, his heaviest weight ever was 343 pounds, he craves chips, pizza, chicken wings, and sweets, he snacks frequently in the evenings, he is trying to follow a vegetarian diet, he is frequently drinking liquids with calories, he frequently makes poor food choices, he has problems with excessive hunger, he frequently eats larger portions than normal, he has binge eating behaviors, and he struggles with emotional  eating.  Depression Screen Joel [name]'s Food and Mood (modified PHQ-9) score was 21.  Depression screen PHQ 2/9 07/25/2020  Decreased Interest 3  Down, Depressed, Hopeless 3  PHQ - 2 Score 6  Altered sleeping 1  Tired, decreased energy 2  Change in appetite 3  Feeling bad or failure about yourself  3  Trouble concentrating 0  Moving slowly or fidgety/restless 3  Suicidal thoughts 3  PHQ-9 Score 21  Difficult doing work/chores Extremely dIfficult  Some recent data might be hidden     Assessment/Plan:   Orders Placed This Encounter  Procedures   CBC with Differential/Platelet   Comprehensive metabolic panel   Lipid Panel With LDL/HDL Ratio   T4, free   TSH   Insulin, random   EKG 12-Lead   1. Other fatigue Joel [name] reports daytime somnolence and admits to waking up still tired. Patent has a history of symptoms of daytime fatigue, morning fatigue, and snoring. Joel [name] generally gets 4 or 5 hours of sleep per night, and states that he has poor quality sleep. Snoring is present. Apneic episodes are present. Epworth Sleepiness Score is 13.  Joel [name] does feel that his weight is causing his energy to be lower than it should be. Fatigue may be related to obesity, depression or many other causes. Labs will be ordered, and in the meanwhile, Joel [name] will focus on self care including making healthy food choices, increasing physical activity and focusing on stress reduction.  Check EKG and labs today.  - EKG 12-Lead - CBC with Differential/Platelet -  T4, free - TSH  2. Shortness of breath on exertion Joel [name] notes increasing shortness of breath with exercising and seems to be worsening over time with weight gain. He notes getting out of breath sooner with activity than he used to. This has gotten worse recently. Joel [name] denies shortness of breath at rest or orthopnea.  Joel [name] does feel that he gets out of breath more easily that he used to when he exercises. Joel [name]'s shortness of breath appears to be  obesity related and exercise induced. He has agreed to work on weight loss and gradually increase exercise to treat his exercise induced shortness of breath. Will continue to monitor closely.  Will check IC today.  3. Hypertensive heart disease with heart failure (HCC) With history of CHF, diagnosed in 2016, status post CVA in 03/2019.  He is taking Norvasc 5 mg daily, Coreg 12.5 mg twice daily,and Plavix 75 mg daily.  He is followed by Dr. Mariah [name] of Cardiology, and Joel Austin, NP, of Neurology.  Plan:  Continue medications.  Continue to follow with Cardiology and Neurology.  4. Hyperlipidemia associated with type 2 diabetes mellitus (HCC) Course: Not at goal. Lipid-lowering medications: Crestor 40 mg daily.   Plan: Dietary changes: Increase soluble fiber, decrease simple carbohydrates, decrease saturated fat. Exercise changes: Moderate to vigorous-intensity aerobic activity 150 minutes per week or as tolerated. We will continue to monitor along with PCP/specialists as it pertains to his weight loss journey.  Will check FLP today.  Lab Results  Component Value Date   CHOL 221 (H) 07/25/2020   HDL 59 07/25/2020   LDLCALC 150 (H) 07/25/2020   TRIG 69 07/25/2020   CHOLHDL 12.6 03/20/2019   Lab Results  Component Value Date   ALT 16 07/25/2020   AST 17 07/25/2020   ALKPHOS 77 07/25/2020   BILITOT 0.5 07/25/2020   - Lipid Panel With LDL/HDL Ratio  5. Type 2 diabetes mellitus with diabetic nephropathy, without long-term current use of insulin (HCC) Diabetes Mellitus: At goal. Medication: None. Followed by Dr. Elveria [name] of Endocrinology.  Non-medically treated.  Used to be on insulin and a lot of medications prior to 150 pound weight loss.  Issues reviewed: blood sugar goals, complications of diabetes mellitus, hypoglycemia prevention and treatment, exercise, and nutrition.  A1c ~2 months ago was 5.3.  Plan: The importance of regular follow up with PCP and all other specialists as scheduled  was stressed to patient today. The patient will continue to focus on protein-rich, low simple carbohydrate foods. We reviewed the importance of hydration, regular exercise for stress reduction, and restorative sleep. Will check labs today.  Lab Results  Component Value Date   HGBA1C 5.3 05/11/2020   HGBA1C 7.0 (H) 03/20/2019   HGBA1C 13.3 (H) 12/15/2017   Lab Results  Component Value Date   LDLCALC 150 (H) 07/25/2020   CREATININE 1.59 (H) 07/25/2020   - Comprehensive metabolic panel - Insulin, random  6. OSA (obstructive sleep apnea) ESS is 13.  He is using a CPAP.  Followed by Dr. Welton Flakes in Pulmonology.  Plan:  Continue to follow with Pulmonology.  Check labs today.  OSA is a cause of systemic hypertension and is associated with an increased incidence of stroke, heart failure, atrial fibrillation, and coronary heart disease. Severe OSA increases all-cause mortality and  cardiovascular mortality.   Goal: Treatment of OSA via CPAP compliance and weight loss. Plasma ghrelin levels (appetite or "hunger hormone") are significantly higher in OSA patients than in BMI-matched controls, but decrease to  levels similar to those of obese patients without OSA after CPAP treatment.  Weight loss improves OSA by several mechanisms, including reduction in fatty tissue in the throat (i.e. parapharyngeal fat) and the tongue. Loss of abdominal fat increases mediastinal traction on the upper airway making it less likely to collapse during sleep. Studies have also shown that compliance with CPAP treatment improves leptin (hunger inhibitory hormone) imbalance.  7. Binge eating disorder Has several characteristics of BED. History of bulimia as well. For the past 3-4 years- vomits 3-4 times per week.   Plan: Referral to Dr Dewaine Conger. He is not a candidate for vyvanse medications due to his other medical conditions. Pt will work intensively with our [facility] specialist and we will follow closely.  People who binge eat  feel as if they don't have control over how much they eat and have feelings of guilt or self-loathing after a binge eating episode.  - Mindful eating is the recommended nutritional approach to treating BED.  Handouts given to pt. I also rec pt.work with his counselor as well once established.  8. Other depression, with emotional eating Not at goal.  Medication: None.  With PTSD and anxiety.  PHQ-9 is 21.  Endorses freq and long-standing SI, but no plan, and says he never would because he is scared to die.  Never had plan.  Pt is not on any meds for mood but thinks he probably needs them.  Has been encouraged to go to counselors by other providers, but hasn't been established   Plan:  After a long discussion, we decided it best that pt will contact PCP regarding referral to a psychiatrist for medication management and counseling for PTSD, depression/anxiety.  Behavior modification techniques were discussed today to help deal with emotional/non-hunger eating behaviors. Patient was referred to Dr. Dewaine Conger, our Bariatric Psychologist, for evaluation due to his elevated PHQ-9 score and significant struggles with emotional eating.  9. Class 1 obesity with serious comorbidity and body mass index (BMI) of 33.0 to 33.9 in adult, unspecified obesity type  Aylen is currently in the action stage of change and his goal is to continue with weight loss efforts. I recommend Lenora begin the structured treatment plan as follows:  He has agreed to the Category 3 Plan.  Exercise goals:  As is.    Behavioral modification strategies: increasing water intake and planning for success.  He was informed of the importance of frequent follow-up visits to maximize his success, he has a f/up OV in 2 wks.  He was informed we would discuss his lab results at his next visit unless there is a critical issue that needs to be addressed sooner. Decarlo agreed to keep his next visit at the agreed upon time to discuss these  results.    Objective:   Blood pressure (!) 136/92, height 5\' 7"  (1.702 m), weight 211 lb (95.7 kg). Body mass index is 33.05 kg/m.  EKG: Normal sinus rhythm, rate 74 bpm.  Indirect Calorimeter completed today shows a VO2 of 274 and a REE of 1907.  His calculated basal metabolic rate is 1610 thus his basal metabolic rate is worse than expected.  General: Cooperative, alert, well developed, in no acute distress. HEENT: Conjunctivae and lids unremarkable. Cardiovascular: Regular rhythm.  Lungs: Normal work of breathing. Neurologic: No focal deficits.   Lab Results  Component Value Date   CREATININE 1.59 (H) 07/25/2020   BUN 22 (H) 07/25/2020   NA 141 07/25/2020   K 4.0 07/25/2020  CL 100 07/25/2020   CO2 24 07/25/2020   Lab Results  Component Value Date   ALT 16 07/25/2020   AST 17 07/25/2020   ALKPHOS 77 07/25/2020   BILITOT 0.5 07/25/2020   Lab Results  Component Value Date   HGBA1C 5.3 05/11/2020   HGBA1C 7.0 (H) 03/20/2019   HGBA1C 13.3 (H) 12/15/2017   HGBA1C 14.7 (H) 02/12/2017   HGBA1C 7.3 (H) 10/11/2014   Lab Results  Component Value Date   INSULIN 10.5 07/25/2020   Lab Results  Component Value Date   TSH 1.980 07/25/2020   Lab Results  Component Value Date   CHOL 221 (H) 07/25/2020   HDL 59 07/25/2020   LDLCALC 150 (H) 07/25/2020   TRIG 69 07/25/2020   CHOLHDL 12.6 03/20/2019   Lab Results  Component Value Date   WBC 4.3 07/25/2020   HGB 14.6 07/25/2020   HCT 44.0 07/25/2020   MCV 85 07/25/2020   PLT 224 07/25/2020     Obesity Behavioral Intervention:   Approximately 15 minutes were spent on the discussion below.  ASK: We discussed the diagnosis of obesity with Arlys John today and Donzel agreed to give [place] permission to discuss obesity behavioral modification therapy today.  ASSESS: Yanky has the diagnosis of obesity and his BMI today is 33.2. Zanden is in the action stage of change.   ADVISE: Jameel was educated on the multiple health  risks of obesity as well as the benefit of weight loss to improve his health. He was advised of the need for long term treatment and the importance of lifestyle modifications to improve his current health and to decrease his risk of future health problems.  AGREE: Multiple dietary modification options and treatment options were discussed and Zorion agreed to follow the recommendations documented in the above note.  ARRANGE: Kannon was educated on the importance of frequent visits to treat obesity as outlined per CMS and USPSTF guidelines and agreed to schedule his next follow up appointment today.    Attestation Statements:   This is the patient's first visit at [facility]. The patient's NEW PATIENT PACKET was reviewed at length. Included in the packet: current and past health history, medications, allergies, ROS, gynecologic history (women only), surgical history, family history, social history, weight history, weight loss surgery history (for those that have had weight loss surgery), nutritional evaluation, mood and food questionnaire, PHQ9, Epworth questionnaire, sleep habits questionnaire, patient life and health improvement goals questionnaire. These will all be scanned into the patient's chart under media.   During the visit, I independently reviewed the patient's EKG, bioimpedance scale results, and indirect calorimeter results. I used this information to tailor a meal plan for the patient that will help him to lose weight and will improve his obesity-related conditions going forward. I performed a medically necessary appropriate examination and/or evaluation. I discussed the assessment and treatment plan with the patient. The patient was provided an opportunity to ask questions and all were answered. The patient agreed with the plan and demonstrated an understanding of the instructions. Labs were ordered at this visit and will be reviewed at the next visit unless more critical  results need to be addressed immediately. Clinical information was updated and documented in the EMR.   I, Insurance claims handler, CMA, am acting as Energy manager for [employer], DO.  I have reviewed the above documentation for accuracy and completeness, and I agree with the above. Carlye Grippe, D.O.  The 21st Century Cures Act  was signed into law in 2016 which includes the topic of electronic health records.  This provides immediate access to information in MyChart.  This includes consultation notes, operative notes, office notes, lab results and pathology reports.  If you have any questions about what you read please let us know at your next visit so we can discuss your concerns and take corrective action if need be.  We are right here with you.

## 2020-08-08 ENCOUNTER — Ambulatory Visit (INDEPENDENT_AMBULATORY_CARE_PROVIDER_SITE_OTHER): Payer: Medicare Other | Admitting: Family Medicine

## 2020-08-08 ENCOUNTER — Telehealth (INDEPENDENT_AMBULATORY_CARE_PROVIDER_SITE_OTHER): Payer: Medicare Other | Admitting: Psychology

## 2020-08-08 ENCOUNTER — Other Ambulatory Visit: Payer: Self-pay

## 2020-08-08 ENCOUNTER — Encounter (INDEPENDENT_AMBULATORY_CARE_PROVIDER_SITE_OTHER): Payer: Self-pay | Admitting: Family Medicine

## 2020-08-08 VITALS — BP 128/81 | HR 90 | Temp 98.3°F | Ht 67.0 in | Wt 225.0 lb

## 2020-08-08 DIAGNOSIS — E1169 Type 2 diabetes mellitus with other specified complication: Secondary | ICD-10-CM | POA: Diagnosis not present

## 2020-08-08 DIAGNOSIS — E1159 Type 2 diabetes mellitus with other circulatory complications: Secondary | ICD-10-CM

## 2020-08-08 DIAGNOSIS — Z6833 Body mass index (BMI) 33.0-33.9, adult: Secondary | ICD-10-CM

## 2020-08-08 DIAGNOSIS — I152 Hypertension secondary to endocrine disorders: Secondary | ICD-10-CM | POA: Diagnosis not present

## 2020-08-08 DIAGNOSIS — F39 Unspecified mood [affective] disorder: Secondary | ICD-10-CM

## 2020-08-08 DIAGNOSIS — F332 Major depressive disorder, recurrent severe without psychotic features: Secondary | ICD-10-CM

## 2020-08-08 DIAGNOSIS — I639 Cerebral infarction, unspecified: Secondary | ICD-10-CM | POA: Diagnosis not present

## 2020-08-08 DIAGNOSIS — F509 Eating disorder, unspecified: Secondary | ICD-10-CM | POA: Diagnosis not present

## 2020-08-08 DIAGNOSIS — N189 Chronic kidney disease, unspecified: Secondary | ICD-10-CM

## 2020-08-08 DIAGNOSIS — E785 Hyperlipidemia, unspecified: Secondary | ICD-10-CM

## 2020-08-08 DIAGNOSIS — E669 Obesity, unspecified: Secondary | ICD-10-CM

## 2020-08-08 DIAGNOSIS — F439 Reaction to severe stress, unspecified: Secondary | ICD-10-CM | POA: Diagnosis not present

## 2020-08-12 ENCOUNTER — Encounter: Payer: Self-pay | Admitting: Adult Health

## 2020-08-15 NOTE — Telephone Encounter (Signed)
Any suggestions>?

## 2020-08-15 NOTE — Progress Notes (Signed)
Office: [phone]  /  Fax: [phone]    Date: August 29, 2020   Appointment Start Time: 8:34am Duration: 34 minutes Provider: Lawerance Cruel, Psy.D. Type of Session: Individual Therapy  Location of Patient: Home Location of Provider: Provider's home (private office) Type of Contact: Telepsychological Visit via MyChart Video Visit  Session Content: Joel [name] is a 40 y.o. male presenting for a follow-up appointment to address the previously established treatment goal of increasing coping skills. Today's appointment was a telepsychological visit due to COVID-19. Joel [name] provided verbal consent for today's telepsychological appointment and he is aware he is responsible for securing confidentiality on his end of the session. Prior to proceeding with today's appointment, Joel [name]'s physical location at the time of this appointment was obtained as well a phone number he could be reached at in the event of technical difficulties. Khale and this provider participated in today's telepsychological service.   This provider conducted a brief check-in and verbally administered the PHQ-9 and GAD-7. A risk assessment was completed. Joel [name] discussed experiencing passive suicidal ideation daily (i.e., "I feel like an after thought to people." and "People would be better off without me."). He discussed engaging in self-talk to cope. He denied ever experiencing suicidal plan and intent. He was observed becoming distressed discussing passive suicidal ideation; therefore, he was engaged in a grounding technique using his senses. His experienced was processed after. Joel [name] provided verbal consent during today's appointment for this provider to send the handout for today's exercise via e-mail. Joel [name] denied current suicidal, homicidal, and self-injurious ideation, plan, and intent. He continues to acknowledge understanding regarding the importance of reaching out to trusted individuals and/or emergency resources if he is unable to  ensure safety. He also continues to express confidence in his ability to keep himself safe.   Joel [name] shared eating when experiencing "big emotions," but discussed a reduction in eating when experiencing urges/cravings. He acknowledged he is sleeping more throughout the day, which he believes is due to physical pain and decreased self-confidence. Notably, Joel [name] believes he has a little "more energy" and is "inclined to do stuff" since starting psychotropic medications. He recalled going to the museum with his wife and best friend, adding he did not feel he needed to go to his bedroom after, which is his "safe place." Moreover, psychoeducation regarding pleasurable activities, including its impact on emotional eating and overall well-being was provided. Joel [name] brainstormed activities he previously enjoyed doing, including spending time with his daughter. Given his daughter's work schedule, he shared he does not see her often. He was receptive to coming out of his bedroom daily to say hello to his daughter every time she comes home from work between now and the next appointment with this provider. Joel [name] was also provided with a handout with various options of pleasurable activities, and was encouraged to engage in one activity a day and additional activities as needed when triggered to emotionally eat. Joel [name] agreed. Joel [name] provided verbal consent during today's appointment for this provider to send a handout with pleasurable activities via e-mail.   Of note, Joel [name] continues to provide verbal consent for this provider to send handouts to the e-mail address on file, which is his wife's e-mail address. He provided verbal consent for his wife, Joel [name], to join the appointment in the end to schedule the next appointment with this provider and discuss establishing care with a therapist for longer-term services, as he was unsure if he has an appointment scheduled. Joel [name] indicated they are experiencing difficulty  finding a provider that accepts [employer]. She indicated she did not see the referrals previously shared by this provider. As such, Joel [name] provided verbal consent again for this provider to send options to his wife's e-mail address. Joel [name] also requested this provider remind Joel [name] that authorizations for coordination of care need to be completed while they are at the clinic this morning for their appointment with Joel Cliche, PA-C.   Overall,  Joel [name] was receptive to today's appointment as evidenced by openness to sharing, responsiveness to feedback, and willingness to implement discussed strategies .  Mental Status Examination:  Appearance: well groomed and appropriate hygiene  Behavior: appropriate to circumstances Mood: depressed Affect: mood congruent Speech: normal in rate, volume, and tone Eye Contact: appropriate Psychomotor Activity: unable to assess Gait: unable to assess Thought Process: linear, logical, and goal directed  Thought Content/Perception: denies current suicidal and homicidal ideation, plan, and intent, no hallucinations, delusions, bizarre thinking or behavior reported or observed, and denies current ideation of and engagement in self-injurious behaviors Orientation: time, person, place, and purpose of appointment Memory/Concentration: memory, attention, language, and fund of knowledge intact  Insight/Judgment: fair  Structured Assessments Results: The Patient Health Questionnaire-9 (PHQ-9) is a self-report measure that assesses symptoms and severity of depression over the course of the last two weeks. Joel [name] obtained a score of 20 suggesting severe depression. Joel [name] finds the endorsed symptoms to be very difficult. [0= Not at all; 1= Several days; 2= More than half the days; 3= Nearly every day] Little interest or pleasure in doing things 1  Feeling down, depressed, or hopeless 3  Trouble falling or staying asleep, or sleeping too much 3  Feeling tired or  having little energy 3  Poor appetite or overeating 3  Feeling bad about yourself --- or that you are a failure or have let yourself or your family down 3  Trouble concentrating on things, such as reading the newspaper or watching television 2  Moving or speaking so slowly that other people could have noticed? Or the opposite --- being so fidgety or restless that you have been moving around a lot more than usual 0  Thoughts that you would be better off dead or hurting yourself in some way 2  PHQ-9 Score 20    The Generalized Anxiety Disorder-7 (GAD-7) is a brief self-report measure that assesses symptoms of anxiety over the course of the last two weeks. Xachary obtained a score of 14 suggesting moderate anxiety. Kodah finds the endorsed symptoms to be very difficult. [0= Not at all; 1= Several days; 2= Over half the days; 3= Nearly every day] Feeling nervous, anxious, on edge 0  Not being able to stop or control worrying 3  Worrying too much about different things 3  Trouble relaxing 3  Being so restless that it's hard to sit still 0  Becoming easily annoyed or irritable 2  Feeling afraid as if something awful might happen 3  GAD-7 Score 14   Interventions:  Conducted a brief chart review Verbal administration of PHQ-9 and GAD-7 for symptom monitoring Provided empathic reflections and validation Conducted a risk assessment Psychoeducation provided regarding pleasurable activities Employed supportive psychotherapy interventions to facilitate reduced distress, and to improve coping skills with identified stressors Psychoeducation provided regarding grounding techniques Engaged patient in a grounding technique   DSM-5 Diagnosis(es):  F33.2 Major Depressive Disorder, Recurrent Episode, Severe, With Anxious Distress, 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 Progress is limited, as Sabas has just begun treatment with this provider; however, he is receptive to the interaction and interventions and rapport is being established.   Plan: Rustin continues to express appreciation for services with this provider. The next appointment will be scheduled in one week, which will be via MyChart Video Visit. The next session will focus on working towards the established treatment goal. This provider will continue to assess for safety.

## 2020-08-17 ENCOUNTER — Telehealth: Payer: Self-pay

## 2020-08-17 ENCOUNTER — Other Ambulatory Visit: Payer: Self-pay

## 2020-08-17 MED ORDER — REPATHA 140 MG/ML ~~LOC~~ SOSY: 140.0000 mg | PREFILLED_SYRINGE | SUBCUTANEOUS | 24 refills | Status: DC

## 2020-08-17 NOTE — Telephone Encounter (Signed)
Repatha 140mg /ml sureclick was sent in to pharmacy after pt and wife has reviewed Repatha, insurance, and cost.   Refer to regarding conversation with Repatha

## 2020-08-17 NOTE — Telephone Encounter (Signed)
Cover My Meds KEY: [id number] Repatha 140mg /mL  This request has received a favorable outcome and is approved. PA Case: , Status: Approved, Coverage Starts on: 08/17/2020 12:00:00 AM, Coverage Ends on: 11/09/2020 12:00:00 AM.

## 2020-08-17 NOTE — Progress Notes (Signed)
Chief Complaint:   OBESITY Joel [name] is here to discuss his progress with his obesity treatment plan along with follow-up of his obesity related diagnoses.   Today's visit was #: 2 Starting weight: 211 lbs Starting date: 07/25/2020 Today's weight: 225 lbs Today's date: 08/08/2020 Weight change since last visit: +14 lbs Total lbs lost to date: +14 pounds Body mass index is 35.24 kg/m.   Interim History:  Joel [name] is here today for his first follow-up office visit since starting the program with [place].  All blood work/ lab tests that were recently ordered by myself or an outside provider were reviewed with patient today per their request.   Extended time was spent counseling him on all new disease processes that were discovered or preexisting ones that are worsening.  he understands that many of these abnormalities will need to monitored regularly along with the current treatment plan of prudent dietary changes, in which we are making each and every office visit, to improve these health parameters.  Joel [name] says that increased stress caused him to eat.  He says that 85% of the time, "depression causes" him to eat.  Everyday, he ate breakfast on plan and lunch on plan 70% of the time, but was hungry and ate poorly after 1-2 hours.  He says that dinner was hit or miss because of excessive snacking and intake in the afternoons.  Current Meal Plan: the Category 3 Plan for 35% of the time.  Current Exercise Plan: None.  Assessment/Plan:   1. Hypertension associated with diabetes (HCC) At goal. Medications: Norvasc 5 mg daily, Coreg 12.5 mg twice daily.   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  Currently asymptomatic with no concerns.    Plan:  Discussed labs with patient today.  Blood pressure at goal today.  Avoid buying foods that are: processed, frozen, or prepackaged to avoid excess salt. We will watch for signs of hypotension as he continues lifestyle  modifications. We will continue to monitor closely alongside his PCP and/or Specialist.  Regular follow up with PCP and specialists was also encouraged.   BP Readings from Last 3 Encounters:  08/08/20 128/81  07/25/20 (!) 136/92  06/15/20 130/88   Lab Results  Component Value Date   CREATININE 1.59 (H) 07/25/2020   2. Type 2 diabetes mellitus with other specified complication, without long-term current use of insulin (HCC) Diabetes Mellitus: At goal. Medication: None. Issues reviewed: blood sugar goals, complications of diabetes mellitus, hypoglycemia prevention and treatment, exercise, and nutrition.  He is not checking his blood sugar.  Saw Endocrinology in the past.  A1c 2 months ago was 5.3.  Diet controlled.  Plan:  Discussed labs with patient today.  A1c at goal.  Will consider additional medications in the future as needed.  Decrease simple carbs.  Increase proteins.  Continue prudent nutritional plan and weight loss. The importance of regular follow up with PCP and all other specialists as scheduled was stressed to patient today. The patient will continue to focus on protein-rich, low simple carbohydrate foods. We reviewed the importance of hydration, regular exercise for stress reduction, and restorative sleep.   Lab Results  Component Value Date   HGBA1C 5.3 05/11/2020   HGBA1C 7.0 (H) 03/20/2019   HGBA1C 13.3 (H) 12/15/2017   Lab Results  Component Value Date   LDLCALC 150 (H) 07/25/2020   CREATININE 1.59 (H) 07/25/2020   3. Hyperlipidemia associated with type 2 diabetes mellitus (HCC) Course:  Not at goal. Lipid-lowering medications: Crestor 40 mg daily.  No side effects to medications.  Treated by Cardiology.  Plan:  Discussed labs with patient today.  Dietary changes: Increase soluble fiber, decrease simple carbohydrates, decrease saturated fat. Exercise changes: Moderate to vigorous-intensity aerobic activity 150 minutes per week or as tolerated. We will continue to  monitor along with PCP/specialists as it pertains to his weight loss journey.  Not at goal.  On maximum dose of Crestor.  He will follow-up with Cardiology to see if he is a candidate for additional medications beyond statins.  They will call for an appointment.  Continue prudent nutritional plan and weight loss.  Lab Results  Component Value Date   CHOL 221 (H) 07/25/2020   HDL 59 07/25/2020   LDLCALC 150 (H) 07/25/2020   TRIG 69 07/25/2020   CHOLHDL 12.6 03/20/2019   Lab Results  Component Value Date   ALT 16 07/25/2020   AST 17 07/25/2020   ALKPHOS 77 07/25/2020   BILITOT 0.5 07/25/2020   4. Chronic kidney disease, unspecified CKD stage Improving serum creatinine.  It was over 2.0 for several years.  Two weeks ago it was 0.59.  highest was 2.63 ~1 year ago.  Plan:  Discussed labs with patient today.  Counseling done.  Avoid nephrotoxic substances (which he does).  Control blood pressure and blood sugar.  Hydrate per Cardiology recommendations.   Lab Results  Component Value Date   CREATININE 1.59 (H) 07/25/2020   CREATININE 2.16 (H) 03/23/2019   CREATININE 2.12 (H) 03/22/2019   Lab Results  Component Value Date   CREATININE 1.59 (H) 07/25/2020   BUN 22 (H) 07/25/2020   NA 141 07/25/2020   K 4.0 07/25/2020   CL 100 07/25/2020   CO2 24 07/25/2020   5. Mood disorder (HCC) with Emotional Eating Not at goal. Medication: prazosin 1 mg at bedtime and Zoloft 50 mg daily.  Seen by Psychiatry yesterday.  Started prazosin and Zoloft.  Sees [employer] in [place].  Has to make an appointment with therapist in [place] for PTSD, etc., plus today meets with Dr. Dewaine Conger.  With history of BED.  Not medication candidate with bulimia.  Plan:  Discussed labs with patient today.  Meditate for 10 minutes 3 times per day. Mindful eating and strategies to deal with emotional eating discussed with him today.  Follow-up with Dr. Dewaine Conger.  Obtain separate counselor for PTSD, major  depression, etc.  Continue medications per Psychiatry.  6. Obesity with current BMI of 35.24  Course: Joel [name] is currently in the action stage of change. As such, his goal is to continue with weight loss efforts.   Nutrition goals: He has agreed to practicing portion control and making smarter food choices, such as increasing vegetables and decreasing simple carbohydrates.   Exercise goals:  Walking for 10 minutes daily.  Behavioral modification strategies: increasing lean protein intake, no skipping meals, meal planning and cooking strategies, emotional eating strategies, and avoiding temptations.  Joel [name] has agreed to follow-up with our clinic in 2-3 weeks. He was informed of the importance of frequent follow-up visits to maximize his success with intensive lifestyle modifications for his multiple health conditions.   Objective:   Blood pressure 128/81, pulse 90, temperature 98.3 F (36.8 C), height 5\' 7"  (1.702 m), weight 225 lb (102.1 kg), SpO2 97 %. Body mass index is 35.24 kg/m.  General: Cooperative, alert, well developed, in no acute distress. HEENT: Conjunctivae and lids unremarkable. Cardiovascular: Regular rhythm.  Lungs:  Normal work of breathing. Neurologic: No focal deficits.   Lab Results  Component Value Date   CREATININE 1.59 (H) 07/25/2020   BUN 22 (H) 07/25/2020   NA 141 07/25/2020   K 4.0 07/25/2020   CL 100 07/25/2020   CO2 24 07/25/2020   Lab Results  Component Value Date   ALT 16 07/25/2020   AST 17 07/25/2020   ALKPHOS 77 07/25/2020   BILITOT 0.5 07/25/2020   Lab Results  Component Value Date   HGBA1C 5.3 05/11/2020   HGBA1C 7.0 (H) 03/20/2019   HGBA1C 13.3 (H) 12/15/2017   HGBA1C 14.7 (H) 02/12/2017   HGBA1C 7.3 (H) 10/11/2014   Lab Results  Component Value Date   INSULIN 10.5 07/25/2020   Lab Results  Component Value Date   TSH 1.980 07/25/2020   Lab Results  Component Value Date   CHOL 221 (H) 07/25/2020   HDL 59 07/25/2020    LDLCALC 150 (H) 07/25/2020   TRIG 69 07/25/2020   CHOLHDL 12.6 03/20/2019   Lab Results  Component Value Date   WBC 4.3 07/25/2020   HGB 14.6 07/25/2020   HCT 44.0 07/25/2020   MCV 85 07/25/2020   PLT 224 07/25/2020   Attestation Statements:   Reviewed by clinician on day of visit: allergies, medications, problem list, medical history, surgical history, family history, social history, and previous encounter notes.  Time spent on visit including pre-visit chart review and post-visit care and charting was 55 minutes.   I, Insurance claims handler, CMA, am acting as Energy manager for [employer], DO.  I have reviewed the above documentation for accuracy and completeness, and I agree with the above. Carlye Grippe, D.O.  The 21st Century Cures Act was signed into law in 2016 which includes the topic of electronic health records.  This provides immediate access to information in MyChart.  This includes consultation notes, operative notes, office notes, lab results and pathology reports.  If you have any questions about what you read please let us know at your next visit so we can discuss your concerns and take corrective action if need be.  We are right here with you.

## 2020-08-18 ENCOUNTER — Telehealth: Payer: Self-pay

## 2020-08-18 NOTE — Telephone Encounter (Signed)
Pt recently started on Repatha this week, order sent in Prior Authorization sent in for coverage, Pt APPROVED through 11/09/2020  F/u appt with Dr. Mariah Milling on 8/29, will obtain lipid panel then to recheck levels

## 2020-08-22 ENCOUNTER — Ambulatory Visit (INDEPENDENT_AMBULATORY_CARE_PROVIDER_SITE_OTHER): Payer: Medicare Other | Admitting: Physician Assistant

## 2020-08-29 ENCOUNTER — Ambulatory Visit (INDEPENDENT_AMBULATORY_CARE_PROVIDER_SITE_OTHER): Payer: Medicare Other | Admitting: Physician Assistant

## 2020-08-29 ENCOUNTER — Telehealth (INDEPENDENT_AMBULATORY_CARE_PROVIDER_SITE_OTHER): Payer: Medicare Other | Admitting: Psychology

## 2020-08-29 ENCOUNTER — Other Ambulatory Visit: Payer: Self-pay

## 2020-08-29 ENCOUNTER — Encounter (INDEPENDENT_AMBULATORY_CARE_PROVIDER_SITE_OTHER): Payer: Self-pay | Admitting: Physician Assistant

## 2020-08-29 VITALS — BP 123/82 | HR 78 | Temp 97.9°F | Ht 67.0 in | Wt 228.0 lb

## 2020-08-29 DIAGNOSIS — Z6833 Body mass index (BMI) 33.0-33.9, adult: Secondary | ICD-10-CM | POA: Diagnosis not present

## 2020-08-29 DIAGNOSIS — F509 Eating disorder, unspecified: Secondary | ICD-10-CM

## 2020-08-29 DIAGNOSIS — F332 Major depressive disorder, recurrent severe without psychotic features: Secondary | ICD-10-CM | POA: Diagnosis not present

## 2020-08-29 DIAGNOSIS — F439 Reaction to severe stress, unspecified: Secondary | ICD-10-CM

## 2020-08-29 DIAGNOSIS — E669 Obesity, unspecified: Secondary | ICD-10-CM

## 2020-08-29 DIAGNOSIS — I5022 Chronic systolic (congestive) heart failure: Secondary | ICD-10-CM

## 2020-08-29 NOTE — Progress Notes (Signed)
Office: [phone]  /  Fax: [phone]    Date: September 05, 2020   Appointment Start Time: 11:29am Duration: 29 minutes Provider: Glennie [name], Psy.D. Type of Session: Individual Therapy  Location of Patient: Home Location of Provider: Provider's home (private office) Type of Contact: Telepsychological Visit via MyChart Video Visit  Session Content: Joel [name] is a 40 y.o. male presenting for a follow-up appointment to address the previously established treatment goal of increasing coping skills. Today's appointment was a telepsychological visit due to COVID-19. Joel [name] provided verbal consent for today's telepsychological appointment and he is aware he is responsible for securing confidentiality on his end of the session. Prior to proceeding with today's appointment, Joel [name]'s physical location at the time of this appointment was obtained as well a phone number he could be reached at in the event of technical difficulties. Joel [name] and this provider participated in today's telepsychological service. Of note, today's appointment was switched to a regular telephone call at 11:31am with Joel [name]'s verbal consent due to technical issues.   This provider conducted a brief check-in. Joel [name] stated, "I sleep a lot." He indicated he met with his psychiatric provider yesterday resulting in a change in his medications. He clarified he informed the provider about the sleep concerns and decreased self-esteem. Joel [name] added, "I'm not one to be like woe is me." His psychiatric provider also recommended at least 30 minutes outside of his room daily, adding, "I'm going to start doing that more when she [referring to his wife] is cooking and my daughter is out there." Positive reinforcement was provided.   A risk assessment was completed. Joel [name] reported he continues to experience passive suicidal ideation (i.e., "Better off being dead."). He continues to deny suicidal plan and intent, adding he is scared to die. He reported he  continues to have easy access to the developed safety plan, and continues to acknowledge understanding regarding the importance of reaching out to trusted individuals and/or emergency resources if he is unable to ensure safety. Notably, this provider also shared about a crisis text option ([phone]). Joel [name] thanked this provider for the resource.   Reviewed pleasurable activities. Joel [name] stated he has not stepped out of his room when his daughter comes home, but he makes sure he is up to listen to her, especially her laughter. Today, he reported, "I"m going to go out and talk to her for a few minutes." He agreed to review the previously shared handout with pleasurable activities with his wife.  Psychoeducation regarding emotional versus physical hunger was provided. Joel [name] was given a handout to utilize between now and the next appointment to increase awareness of hunger patterns and subsequent eating. Joel [name] provided verbal consent during today's appointment for this provider to send a handout about hunger patterns via e-mail. Discussed the importance of protein intake. Joel [name] agreed to eat protein and vegetables first before eating anything else. Additionally, he was receptive to waiting 10-15 minutes before going for a second serving, as evidenced by him stating, "I like it."   Of note, Joel [name] continues to provide verbal consent for this provider to send handouts to the e-mail address on file, which is his wife's e-mail address. Joel [name] provided verbal consent for his wife, Joel [name], to join the appointment in the end to schedule the next appointment with this provider, discuss the recent appointment with psychiatric provider, and discuss establishing care for traditional therapeutic services. Of note, Joel [name] signed an authorization for this provider to speak with his wife as well. She shared the  psychiatric provider is tapering him off prazosin and increasing the dose for sertraline. At the end of the month,  Joel [name] indicated Wellbutrin may be added. He still does not have a primary therapist., but they both agreed to work towards initiating services between now and the next appointment with this provider. Overall,  Joel [name] was receptive to today's appointment as evidenced by openness to sharing, responsiveness to feedback, and willingness to discussed hunger patterns.  Mental Status Examination:  Appearance: unable to assess  Behavior: unable to assess Mood: depressed Affect: unable to fully assess Speech: normal in rate, volume, and tone Eye Contact: unable to assess Psychomotor Activity: unable to assess Gait: unable to assess Thought Process: linear, logical, and goal directed  Thought Content/Perception: endorses suicidal ideation, but denies plan and intent; no hallucinations, delusions, bizarre thinking or behavior reported or observed, and denies homicidal ideation, plan, and intent Orientation: time, person, place, and purpose of appointment Memory/Concentration: memory, attention, language, and fund of knowledge intact  Insight/Judgment: fair  Interventions:  Conducted a brief chart review Provided empathic reflections and validation Reviewed content from the previous session Conducted a risk assessment Processed thoughts and feelings Psychoeducation provided regarding physical versus emotional hunger Employed supportive psychotherapy interventions to facilitate reduced distress, and to improve coping skills with identified stressors  DSM-5 Diagnosis(es):  F33.2 Major Depressive Disorder, Recurrent Episode, Severe, With Anxious Distress, 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 Joel [name] has demonstrated progress in his goal as evidenced by increased awareness of hunger  patterns. Joel [name] also demonstrates willingness to engage in pleasurable activities.  Plan: The next appointment will be scheduled in approximately two weeks, which will be via MyChart Video Visit. The next session will focus on working towards the established treatment goal and this provider will continue to assess for safety . Joel [name] and his wife agreed to work towards establishing care with a provider for traditional therapeutic services.

## 2020-09-05 ENCOUNTER — Telehealth (INDEPENDENT_AMBULATORY_CARE_PROVIDER_SITE_OTHER): Payer: Medicare Other | Admitting: Psychology

## 2020-09-05 DIAGNOSIS — F439 Reaction to severe stress, unspecified: Secondary | ICD-10-CM | POA: Diagnosis not present

## 2020-09-05 DIAGNOSIS — F332 Major depressive disorder, recurrent severe without psychotic features: Secondary | ICD-10-CM | POA: Diagnosis not present

## 2020-09-05 DIAGNOSIS — F509 Eating disorder, unspecified: Secondary | ICD-10-CM

## 2020-09-05 NOTE — Progress Notes (Signed)
Office: [phone]  /  Fax: [phone]    Date: September 18, 2020   Appointment Start Time: 12:30pm Duration: 25 minutes Provider: Lawerance Cruel, Psy.D. Type of Session: Individual Therapy  Location of Patient: Home Location of Provider: Provider's home (private office) Type of Contact: Telepsychological Visit via MyChart Video Visit  Session Content: Joel [name] is a 40 y.o. male presenting for a follow-up appointment to address the previously established treatment goal of increasing coping skills. Today's appointment was a telepsychological visit due to COVID-19. Joel [name] provided verbal consent for today's telepsychological appointment and he is aware he is responsible for securing confidentiality on his end of the session. Prior to proceeding with today's appointment, Joel [name]'s physical location at the time of this appointment was obtained as well a phone number he could be reached at in the event of technical difficulties. Joel [name] and this provider participated in today's telepsychological service. Of note, today's appointment was switched to a regular telephone call at 12:32pm with Joel [name]'s verbal consent due to technical issues.   This provider conducted a brief check-in and risk assessment. Joel [name] stated, "I actually lost about 6 pounds." He reported he is trying to work toward further reducing urges, adding recent weight loss has been motivating. Joel [name] further shared,"I'm in one of those moods where my head seems clear." A risk assessment was completed. Joel [name] denied experiencing suicidal and homicidal ideation, plan, and intent since the last appointment with this provider. He reported he continues to have easy access to the developed safety plan, and continues to acknowledge understanding regarding the importance of reaching out to trusted individuals and/or emergency resources if he is unable to ensure safety.   Joel [name] shared an increase in protein intake, adding, "It keeps me full." Reviewed  emotional and physical hunger. He recalled an instance where he was able to avoid eating as he tuned into whether he was hungry or not. Positive reinforcement was provided. Psychoeducation regarding triggers for emotional eating was provided. Joel [name] was provided a handout, and encouraged to utilize the handout between now and the next appointment to increase awareness of triggers and frequency. Joel [name] agreed. This provider also discussed behavioral strategies for specific triggers, such as placing the utensil down when conversing to avoid mindless eating. Joel [name] provided verbal consent during today's appointment for this provider to send a handout about triggers via e-mail. Joel [name] continues to provide verbal consent for this provider to send handouts to the e-mail address on file, which is his wife's e-mail address. Additionally, this provider reviewed pleasurable activities and engaging in them to help cope with emotional eating behaviors. He recalled going for a walk which helped him avoid engaging in emotional eating behaviors. Positive reinforcement was provided.   Joel [name] provided verbal consent for his wife, Joel [name], to join the appointment in the end to schedule the next appointment with this provider and discuss establishing care for traditional therapeutic services. Joel [name] shared she placed a new patient inquiry on Friday with [facility] to establish care for [facility]. Overall, Joel [name] was receptive to today's appointment as evidenced by openness to sharing, responsiveness to feedback, and willingness to explore triggers for emotional eating.  Mental Status Examination:  Appearance: unable to assess  Behavior: unable to assess Mood: euthymic Affect: mood congruent Speech: normal in rate, volume, and tone Eye Contact: unable to assess Psychomotor Activity: unable to assess Gait: unable to assess Thought Process: linear, logical, and goal directed  Thought Content/Perception: denies suicidal and  homicidal ideation, plan, and intent, no hallucinations, delusions,  bizarre thinking or behavior reported or observed, and denies ideation and engagement in self-injurious behaviors Orientation: time, person, place, and purpose of appointment Memory/Concentration: memory, attention, language, and fund of knowledge intact  Insight/Judgment: fair  Interventions:  Conducted a brief chart review Provided empathic reflections and validation Reviewed content from the previous session Conducted a risk assessment Psychoeducation provided regarding triggers for emotional eating Employed supportive psychotherapy interventions to facilitate reduced distress, and to improve coping skills with identified stressors  DSM-5 Diagnosis(es):  F33.2 Major Depressive Disorder, Recurrent Episode, Severe, With Anxious Distress, 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 Joel [name] has demonstrated progress in his goal as evidenced by increased awareness of hunger patterns. Delson also continues to demonstrate willingness to engage in pleasurable activities.  Plan: The next appointment will be scheduled in approximately two weeks, which will be via MyChart Video Visit. The next session will focus on working towards the established treatment goal.

## 2020-09-05 NOTE — Progress Notes (Signed)
Chief Complaint:   OBESITY Joel [name] is here to discuss his progress with his obesity treatment plan along with follow-up of his obesity related diagnoses. Joel [name] is on practicing portion control and making smarter food choices, such as increasing vegetables and decreasing simple carbohydrates and states he is following his eating plan approximately 25% of the time. Joel [name] states he is walking for 2 hours 1-2 times per week.   Today's visit was #: 3 Starting weight: 211 lbs Starting date: 07/25/2020 Today's weight: 228 lbs Today's date: 08/29/2020 Total lbs lost to date: 0 Total lbs lost since last in-office visit: 0  Interim History: Joel [name] has not been following the plan and he voices that it is him "that is the problem". He voices today that he is ready to get back on a structured plan.  Subjective:   1. Systolic CHF, chronic (HCC) Joel [name] checks in with Cardiology every 6 months. He is not weighing himself daily although his wife states Cardiology wanted him to. He is on fluid restriction of 2 liters per day.  Assessment/Plan:   1. Systolic CHF, chronic (HCC) Almir was advised to weigh himself if he feels like he is symptomatic or retaining fluid. Defer to Cardiology for daily weights. He is to walk as tolerated.  2. Obesity with current BMI of 35.7 Joel [name] is currently in the action stage of change. As such, his goal is to continue with weight loss efforts. He has agreed to the Category 3 Plan.   Exercise goals: As is, as tolerated.  Behavioral modification strategies: meal planning and cooking strategies and keeping healthy foods in the home.  Joel [name] has agreed to follow-up with our clinic in 2 weeks. He was informed of the importance of frequent follow-up visits to maximize his success with intensive lifestyle modifications for his multiple health conditions.   Objective:   Blood pressure 123/82, pulse 78, temperature 97.9 F (36.6 C), height 5\' 7"  (1.702 m), weight 228 lb  (103.4 kg), SpO2 99 %. Body mass index is 35.71 kg/m.  General: Cooperative, alert, well developed, in no acute distress. HEENT: Conjunctivae and lids unremarkable. Cardiovascular: Regular rhythm.  Lungs: Normal work of breathing. Neurologic: No focal deficits.   Lab Results  Component Value Date   CREATININE 1.59 (H) 07/25/2020   BUN 22 (H) 07/25/2020   NA 141 07/25/2020   K 4.0 07/25/2020   CL 100 07/25/2020   CO2 24 07/25/2020   Lab Results  Component Value Date   ALT 16 07/25/2020   AST 17 07/25/2020   ALKPHOS 77 07/25/2020   BILITOT 0.5 07/25/2020   Lab Results  Component Value Date   HGBA1C 5.3 05/11/2020   HGBA1C 7.0 (H) 03/20/2019   HGBA1C 13.3 (H) 12/15/2017   HGBA1C 14.7 (H) 02/12/2017   HGBA1C 7.3 (H) 10/11/2014   Lab Results  Component Value Date   INSULIN 10.5 07/25/2020   Lab Results  Component Value Date   TSH 1.980 07/25/2020   Lab Results  Component Value Date   CHOL 221 (H) 07/25/2020   HDL 59 07/25/2020   LDLCALC 150 (H) 07/25/2020   TRIG 69 07/25/2020   CHOLHDL 12.6 03/20/2019   No results found for: VD25OH Lab Results  Component Value Date   WBC 4.3 07/25/2020   HGB 14.6 07/25/2020   HCT 44.0 07/25/2020   MCV 85 07/25/2020   PLT 224 07/25/2020   No results found for: IRON, TIBC, FERRITIN  Obesity Behavioral Intervention:   Approximately 15 minutes  were spent on the discussion below.  ASK: We discussed the diagnosis of obesity with Joel [name] today and Joel [name] agreed to give [place] permission to discuss obesity behavioral modification therapy today.  ASSESS: Joel [name] has the diagnosis of obesity and his BMI today is 35.7. Joel [name] is in the action stage of change.   ADVISE: Joel [name] was educated on the multiple health risks of obesity as well as the benefit of weight loss to improve his health. He was advised of the need for long term treatment and the importance of lifestyle modifications to improve his current health and to decrease his risk  of future health problems.  AGREE: Multiple dietary modification options and treatment options were discussed and Joel [name] agreed to follow the recommendations documented in the above note.  ARRANGE: Joel [name] was educated on the importance of frequent visits to treat obesity as outlined per CMS and USPSTF guidelines and agreed to schedule his next follow up appointment today.  Attestation Statements:   Reviewed by clinician on day of visit: allergies, medications, problem list, medical history, surgical history, family history, social history, and previous encounter notes.   Joel [name], am acting as transcriptionist for [employer], PA-C.  I have reviewed the above documentation for accuracy and completeness, and I agree with the above. Joel Cliche, PA-C

## 2020-09-12 ENCOUNTER — Encounter (INDEPENDENT_AMBULATORY_CARE_PROVIDER_SITE_OTHER): Payer: Self-pay | Admitting: Family Medicine

## 2020-09-12 ENCOUNTER — Ambulatory Visit (INDEPENDENT_AMBULATORY_CARE_PROVIDER_SITE_OTHER): Payer: Medicare Other | Admitting: Family Medicine

## 2020-09-12 ENCOUNTER — Other Ambulatory Visit: Payer: Self-pay

## 2020-09-12 VITALS — BP 132/93 | HR 89 | Temp 98.4°F | Ht 67.0 in | Wt 223.0 lb

## 2020-09-12 DIAGNOSIS — I10 Essential (primary) hypertension: Secondary | ICD-10-CM

## 2020-09-12 DIAGNOSIS — Z6833 Body mass index (BMI) 33.0-33.9, adult: Secondary | ICD-10-CM | POA: Diagnosis not present

## 2020-09-12 DIAGNOSIS — I639 Cerebral infarction, unspecified: Secondary | ICD-10-CM

## 2020-09-12 DIAGNOSIS — E669 Obesity, unspecified: Secondary | ICD-10-CM

## 2020-09-13 NOTE — Progress Notes (Signed)
Chief Complaint:   OBESITY Joel [name] is here to discuss his progress with his obesity treatment plan along with follow-up of his obesity related diagnoses. Joel [name] is on the Category 3 Plan and states he is following his eating plan approximately 55% of the time. Joel [name] states he is not currently exercising.  Today's visit was #: 4 Starting weight: 211 lbs Starting date: 07/25/2020 Today's weight: 223 lbs Today's date: 09/12/2020 Total lbs lost to date: 0 Total lbs lost since last in-office visit: 5  Interim History: Joel [name] is working with counselor, Dr. Dewaine Conger, here and at [facility]- sees psychiatrist. He is able to control his emotional eating and binge eating much more so lately. He has no issues with meal plan. Pt is weighing protein and denies hunger or cravings when on plan.  Subjective:   1. Essential hypertension Joel [name] is under treatment of cardiology. His BP is well controlled currently, per pt and wife.   BP Readings from Last 3 Encounters:  09/12/20 (!) 132/93  08/29/20 123/82  08/08/20 128/81   Lab Results  Component Value Date   CREATININE 1.59 (H) 07/25/2020   CREATININE 2.16 (H) 03/23/2019   CREATININE 2.12 (H) 03/22/2019   Assessment/Plan:  No orders of the defined types were placed in this encounter.   Medications Discontinued During This Encounter  Medication Reason   sertraline (ZOLOFT) 50 MG tablet Error     No orders of the defined types were placed in this encounter.    1. Essential hypertension Joel [name] is working on healthy weight loss and exercise to improve blood pressure control. We will watch for signs of hypotension as he continues his lifestyle modifications. Continue current treatment plan per cardiology.  2. Obesity with current BMI of 35.1  Joel [name] is currently in the action stage of change. As such, his goal is to continue with weight loss efforts. He has agreed to the Category 3 Plan.   Handouts provided on emotional eating  and counseling resources.  Exercise goals:  Joel [name] will start back at the gym 30-35 minutes 3 days a week.  Behavioral modification strategies: increasing lean protein intake, decreasing simple carbohydrates, emotional eating strategies, avoiding temptations, and planning for success.  Joel [name] has agreed to follow-up with our clinic in 2-3 weeks. He was informed of the importance of frequent follow-up visits to maximize his success with intensive lifestyle modifications for his multiple health conditions.   Objective:   Blood pressure (!) 132/93, pulse 89, temperature 98.4 F (36.9 C), height 5\' 7"  (1.702 m), weight 223 lb (101.2 kg), SpO2 98 %. Body mass index is 34.93 kg/m.  General: Cooperative, alert, well developed, in no acute distress. HEENT: Conjunctivae and lids unremarkable. Cardiovascular: Regular rhythm.  Lungs: Normal work of breathing. Neurologic: No focal deficits.   Lab Results  Component Value Date   CREATININE 1.59 (H) 07/25/2020   BUN 22 (H) 07/25/2020   NA 141 07/25/2020   K 4.0 07/25/2020   CL 100 07/25/2020   CO2 24 07/25/2020   Lab Results  Component Value Date   ALT 16 07/25/2020   AST 17 07/25/2020   ALKPHOS 77 07/25/2020   BILITOT 0.5 07/25/2020   Lab Results  Component Value Date   HGBA1C 5.3 05/11/2020   HGBA1C 7.0 (H) 03/20/2019   HGBA1C 13.3 (H) 12/15/2017   HGBA1C 14.7 (H) 02/12/2017   HGBA1C 7.3 (H) 10/11/2014   Lab Results  Component Value Date   INSULIN 10.5 07/25/2020   Lab Results  Component Value Date   TSH 1.980 07/25/2020   Lab Results  Component Value Date   CHOL 221 (H) 07/25/2020   HDL 59 07/25/2020   LDLCALC 150 (H) 07/25/2020   TRIG 69 07/25/2020   CHOLHDL 12.6 03/20/2019   No results found for: VD25OH Lab Results  Component Value Date   WBC 4.3 07/25/2020   HGB 14.6 07/25/2020   HCT 44.0 07/25/2020   MCV 85 07/25/2020   PLT 224 07/25/2020   No results found for: IRON, TIBC, FERRITIN  Obesity Behavioral  Intervention:   Approximately 15 minutes were spent on the discussion below.  ASK: We discussed the diagnosis of obesity with Joel [name] today and Joel [name] agreed to give [place] permission to discuss obesity behavioral modification therapy today.  ASSESS: Joel [name] has the diagnosis of obesity and his BMI today is 35.1. Joel [name] is in the action stage of change.   ADVISE: Joel [name] was educated on the multiple health risks of obesity as well as the benefit of weight loss to improve his health. He was advised of the need for long term treatment and the importance of lifestyle modifications to improve his current health and to decrease his risk of future health problems.  AGREE: Multiple dietary modification options and treatment options were discussed and Joel [name] agreed to follow the recommendations documented in the above note.  ARRANGE: Joel [name] was educated on the importance of frequent visits to treat obesity as outlined per CMS and USPSTF guidelines and agreed to schedule his next follow up appointment today.  Attestation Statements:   Reviewed by clinician on day of visit: allergies, medications, problem list, medical history, surgical history, family history, social history, and previous encounter notes.  Edmund Hilda, CMA, am acting as transcriptionist for [employer], DO.  I have reviewed the above documentation for accuracy and completeness, and I agree with the above. Carlye Grippe, D.O.  The 21st Century Cures Act was signed into law in 2016 which includes the topic of electronic health records.  This provides immediate access to information in MyChart.  This includes consultation notes, operative notes, office notes, lab results and pathology reports.  If you have any questions about what you read please let us know at your next visit so we can discuss your concerns and take corrective action if need be.  We are right here with you.

## 2020-09-18 ENCOUNTER — Telehealth (INDEPENDENT_AMBULATORY_CARE_PROVIDER_SITE_OTHER): Payer: Medicare Other | Admitting: Psychology

## 2020-09-18 DIAGNOSIS — F332 Major depressive disorder, recurrent severe without psychotic features: Secondary | ICD-10-CM | POA: Diagnosis not present

## 2020-09-18 DIAGNOSIS — F439 Reaction to severe stress, unspecified: Secondary | ICD-10-CM | POA: Diagnosis not present

## 2020-09-18 DIAGNOSIS — F509 Eating disorder, unspecified: Secondary | ICD-10-CM

## 2020-09-19 NOTE — Progress Notes (Signed)
Office: [phone]  /  Fax: [phone]    Date: October 03, 2020   Appointment Start Time: 11:37am Duration: 30 minutes Provider: Lawerance Cruel, Psy.D. Type of Session: Individual Therapy  Location of Patient: Home (private room) Location of Provider: Provider's Home (private office) Type of Contact: Telepsychological Visit via MyChart Video Visit  Session Content: This provider called Joel [name] at 11:35am as he did not present for today's appointment. His wife reportedly indicated she would help him connect. As such, today's appointment was initiated 7 minutes late. Joel [name] is a 40 y.o. male presenting for a follow-up appointment to address the previously established treatment goal of increasing coping skills. Today's appointment was a telepsychological visit due to COVID-19. Joel [name] provided verbal consent for today's telepsychological appointment and he is aware he is responsible for securing confidentiality on his end of the session. Prior to proceeding with today's appointment, Joel [name]'s physical location at the time of this appointment was obtained as well a phone number he could be reached at in the event of technical difficulties. Joel [name] and this provider participated in today's telepsychological service.   This provider conducted a brief check-in. Joel [name] stated he was hanging out with his wife and best friend this morning. Positive reinforcement was provided. He also shared about a recent vacation. Joel [name] stated he "ate" his positive feelings, adding he gained 11 pounds in two days. It was reflected that despite deviations from his eating related goals, he continues to focus on improving his mood and losing weight. A risk assessment was completed. Joel [name] stated he continues to experience passive suicidal ideation (i.e., "Better off dead."). He continues to deny experiencing suicidal plan and intent as well as express confidence in his ability to stay safe. He reported he continues to have easy access  to the developed safety plan, and continues to acknowledge understanding regarding the importance of reaching out to trusted individuals and/or emergency resources if he is unable to ensure safety.   Today's appointment focused on self-compassion and he was engaged in an exercise. He was encouraged to regularly ask himself, "What do I need right now?" He was receptive as evidenced by him stating today's appointment was "very insightful." Joel [name] provided verbal consent for his wife, Joel [name], to join the appointment in the end to schedule the next appointment with this provider and discuss establishing care for traditional therapeutic services. Joel [name] stated Joel [name] was out of network; however, Joel [name] saw his psychiatrist yesterday and was placed on their wait list for a therapist. She indicated another referral option was also provided and she plans to check in with them today. Notably, Joel [name] indicated a psychotropic medication (she could not recall the name) was discontinued and Joel [name]'s Sertraline prescription was increased to 200mg  at bed time. Overall, Joel [name] was receptive to today's appointment as evidenced by openness to sharing, responsiveness to feedback, and willingness to focus on increasing self-compassion.  Mental Status Examination:  Appearance: well groomed and appropriate hygiene  Behavior: appropriate to circumstances Mood: sad Affect: mood congruent Speech: normal in rate, volume, and tone Eye Contact: appropriate Psychomotor Activity: appropriate Gait: unable to assess Thought Process: linear, logical, and goal directed  Thought Content/Perception: endorses passive suicidal ideation, but denies suicidal plan and intent, no hallucinations, delusions, bizarre thinking or behavior reported or observed, and denies ideation of and engagement in self-injurious behaviors Orientation: time, person, place, and purpose of appointment Memory/Concentration: memory, attention, language,  and fund of knowledge intact  Insight/Judgment: fair  Interventions:  Conducted a brief chart  review Conducted a risk assessment Provided empathic reflections and validation Employed supportive psychotherapy interventions to facilitate reduced distress and to improve coping skills with identified stressors Employed insight oriented and cognitive psychotherapy interventions to identify and modify anxiety/mood producing thoughts, beliefs, and negative self-appraisals contributing to distress and emotional eating Psychoeducation provided regarding self-compassion  DSM-5 Diagnosis(es):  F33.2 Major Depressive Disorder, Recurrent Episode, Severe, With Anxious Distress, 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 Joel [name] has demonstrated progress in his goal as evidenced by increased awareness of hunger patterns. Joel [name] also continues to demonstrate willingness to engage in learned skill(s).  Plan: The next appointment will be scheduled in three weeks, which will be via MyChart Video Visit. The next session will focus on working towards the established treatment goal.

## 2020-09-27 ENCOUNTER — Other Ambulatory Visit: Payer: Self-pay

## 2020-09-27 ENCOUNTER — Encounter (INDEPENDENT_AMBULATORY_CARE_PROVIDER_SITE_OTHER): Payer: Self-pay | Admitting: Family Medicine

## 2020-09-27 ENCOUNTER — Ambulatory Visit (INDEPENDENT_AMBULATORY_CARE_PROVIDER_SITE_OTHER): Payer: Medicare Other | Admitting: Family Medicine

## 2020-09-27 VITALS — BP 121/81 | HR 86 | Temp 97.9°F | Ht 67.0 in | Wt 221.0 lb

## 2020-09-27 DIAGNOSIS — E1159 Type 2 diabetes mellitus with other circulatory complications: Secondary | ICD-10-CM

## 2020-09-27 DIAGNOSIS — F39 Unspecified mood [affective] disorder: Secondary | ICD-10-CM | POA: Diagnosis not present

## 2020-09-27 DIAGNOSIS — E669 Obesity, unspecified: Secondary | ICD-10-CM | POA: Diagnosis not present

## 2020-09-27 DIAGNOSIS — I639 Cerebral infarction, unspecified: Secondary | ICD-10-CM

## 2020-09-27 DIAGNOSIS — I152 Hypertension secondary to endocrine disorders: Secondary | ICD-10-CM

## 2020-09-27 DIAGNOSIS — Z6833 Body mass index (BMI) 33.0-33.9, adult: Secondary | ICD-10-CM

## 2020-09-28 ENCOUNTER — Ambulatory Visit (INDEPENDENT_AMBULATORY_CARE_PROVIDER_SITE_OTHER): Payer: Medicare Other | Admitting: Family Medicine

## 2020-09-28 NOTE — Progress Notes (Signed)
Chief Complaint:   OBESITY Joel [name] is here to discuss his progress with his obesity treatment plan along with follow-up of his obesity related diagnoses. Joel [name] is on the Category 3 Plan and states he is following his eating plan approximately 30-35% of the time. Joel [name] states he is walking for 5-10 minutes 2 times per week.  Today's visit was #: 5 Starting weight: 211 lbs Starting date: 6/14/022 Today's weight: 221 lbs Today's date: 09/27/2020 Total lbs lost to date: 0 Total lbs lost since last in-office visit: 2  Interim History: Joel [name] is here for a follow up office visit.  We reviewed his meal plan and questions were answered.  Patient's food recall appears to be accurate and consistent with what is on plan when he is following it.  When eating on plan, his hunger and cravings are well controlled but he has been skipping meals if he is not hungry. If he is too busy during the day he wont eat. Then at dinner he "pigs out", and eats "everything in sight".    Subjective:   1. Hypertension associated with type 2 diabetes mellitus (HCC) Joel [name] denies issues or concerns. He is tolerating his medications well per his Cardiology treatment with cardiomyopathy and heart failure history. His blood pressure is stable, much better than his last office visit.  2. Mood disorder (HCC) Joel [name]'s moos is stable, he notes "less thoughts" of him being better off not here, but he denies any thoughts of harming himself.  Assessment/Plan:  No orders of the defined types were placed in this encounter.   There are no discontinued medications.   No orders of the defined types were placed in this encounter.    1. Hypertension associated with type 2 diabetes mellitus (HCC) Joel [name] will continue to decreasing salt, increase exercise, and continue weight loss via his prudent nutritional plan. We will watch for signs of hypotension as he continues his lifestyle modifications.  2. Mood disorder  Joel [name]) Joel [name] will continue with his psych doctor and counselor, and medications management per his specialists. His mood has much improved from his prior office visits. He will increase his exercise.  3. Obesity with current BMI of 34.7 Joel [name] is currently in the action stage of change. As such, his goal is to continue with weight loss efforts. He has agreed to the Category 3 Plan.   Exercise goals: For substantial health benefits, adults should do at least 150 minutes (2 hours and 30 minutes) a week of moderate-intensity, or 75 minutes (1 hour and 15 minutes) a week of vigorous-intensity aerobic physical activity, or an equivalent combination of moderate- and vigorous-intensity aerobic activity. Aerobic activity should be performed in episodes of at least 10 minutes, and preferably, it should be spread throughout the week. Especially since he doesn't work, we discussed the importance of exercise to help with mood and weight loss.  Behavioral modification strategies: decreasing simple carbohydrates, no skipping meals, and planning for success.  Joel [name] has agreed to follow-up with our clinic in 2 to 3 weeks. He was informed of the importance of frequent follow-up visits to maximize his success with intensive lifestyle modifications for his multiple health conditions.   Objective:   Blood pressure 121/81, pulse 86, temperature 97.9 F (36.6 C), height 5\' 7"  (1.702 m), weight 221 lb (100.2 kg), SpO2 97 %. Body mass index is 34.61 kg/m.  General: Cooperative, alert, well developed, in no acute distress. HEENT: Conjunctivae and lids unremarkable. Cardiovascular: Regular rhythm.  Lungs:  Normal work of breathing. Neurologic: No focal deficits.   Lab Results  Component Value Date   CREATININE 1.59 (H) 07/25/2020   BUN 22 (H) 07/25/2020   NA 141 07/25/2020   K 4.0 07/25/2020   CL 100 07/25/2020   CO2 24 07/25/2020   Lab Results  Component Value Date   ALT 16 07/25/2020   AST 17 07/25/2020    ALKPHOS 77 07/25/2020   BILITOT 0.5 07/25/2020   Lab Results  Component Value Date   HGBA1C 5.3 05/11/2020   HGBA1C 7.0 (H) 03/20/2019   HGBA1C 13.3 (H) 12/15/2017   HGBA1C 14.7 (H) 02/12/2017   HGBA1C 7.3 (H) 10/11/2014   Lab Results  Component Value Date   INSULIN 10.5 07/25/2020   Lab Results  Component Value Date   TSH 1.980 07/25/2020   Lab Results  Component Value Date   CHOL 221 (H) 07/25/2020   HDL 59 07/25/2020   LDLCALC 150 (H) 07/25/2020   TRIG 69 07/25/2020   CHOLHDL 12.6 03/20/2019   No results found for: VD25OH Lab Results  Component Value Date   WBC 4.3 07/25/2020   HGB 14.6 07/25/2020   HCT 44.0 07/25/2020   MCV 85 07/25/2020   PLT 224 07/25/2020   No results found for: IRON, TIBC, FERRITIN  Attestation Statements:   Reviewed by clinician on day of visit: allergies, medications, problem list, medical history, surgical history, family history, social history, and previous encounter notes.  Time spent on visit including pre-visit chart review and post-visit care and charting was 30 minutes.    Trude Mcburney, am acting as transcriptionist for [employer], DO.  I have reviewed the above documentation for accuracy and completeness, and I agree with the above. Carlye Grippe, D.O.  The 21st Century Cures Act was signed into law in 2016 which includes the topic of electronic health records.  This provides immediate access to information in MyChart.  This includes consultation notes, operative notes, office notes, lab results and pathology reports.  If you have any questions about what you read please let us know at your next visit so we can discuss your concerns and take corrective action if need be.  We are right here with you.

## 2020-10-03 ENCOUNTER — Telehealth (INDEPENDENT_AMBULATORY_CARE_PROVIDER_SITE_OTHER): Payer: Medicare Other | Admitting: Psychology

## 2020-10-03 ENCOUNTER — Ambulatory Visit (INDEPENDENT_AMBULATORY_CARE_PROVIDER_SITE_OTHER): Payer: Medicare Other

## 2020-10-03 VITALS — Ht 67.0 in | Wt 230.0 lb

## 2020-10-03 DIAGNOSIS — F439 Reaction to severe stress, unspecified: Secondary | ICD-10-CM

## 2020-10-03 DIAGNOSIS — F509 Eating disorder, unspecified: Secondary | ICD-10-CM

## 2020-10-03 DIAGNOSIS — F332 Major depressive disorder, recurrent severe without psychotic features: Secondary | ICD-10-CM | POA: Diagnosis not present

## 2020-10-03 DIAGNOSIS — Z0000 Encounter for general adult medical examination without abnormal findings: Secondary | ICD-10-CM

## 2020-10-03 NOTE — Patient Instructions (Signed)
Joel [name] , Thank you for taking time to come for your Medicare Wellness Visit. I appreciate your ongoing commitment to your health goals. Please review the following plan we discussed and let me know if I can assist you in the future.   Screening recommendations/referrals: Colonoscopy: n/a Recommended yearly ophthalmology/optometry visit for glaucoma screening and checkup Recommended yearly dental visit for hygiene and checkup  Vaccinations: Influenza vaccine: decline Pneumococcal vaccine: completed 02/14/2017 Tdap vaccine: completed 06/14/2014, due 06/13/2024 Shingles vaccine: n/a   Covid-19: decline  Advanced directives: Advance directive discussed with you today.   Conditions/risks identified: none  Next appointment: Follow up in one year for your annual wellness visit   Preventive Care 40-64 Years, Male Preventive care refers to lifestyle choices and visits with your health care provider that can promote health and wellness. What does preventive care include? A yearly physical exam. This is also called an annual well check. Dental exams once or twice a year. Routine eye exams. Ask your health care provider how often you should have your eyes checked. Personal lifestyle choices, including: Daily care of your teeth and gums. Regular physical activity. Eating a healthy diet. Avoiding tobacco and drug use. Limiting alcohol use. Practicing safe sex. Taking low-dose aspirin every day starting at age 16. What happens during an annual well check? The services and screenings done by your health care provider during your annual well check will depend on your age, overall health, lifestyle risk factors, and family history of disease. Counseling  Your health care provider may ask you questions about your: Alcohol use. Tobacco use. Drug use. Emotional well-being. Home and relationship well-being. Sexual activity. Eating habits. Work and work Astronomer. Screening  You may have the  following tests or measurements: Height, weight, and BMI. Blood pressure. Lipid and cholesterol levels. These may be checked every 5 years, or more frequently if you are over 100 years old. Skin check. Lung cancer screening. You may have this screening every year starting at age 34 if you have a 30-pack-year history of smoking and currently smoke or have quit within the past 15 years. Fecal occult blood test (FOBT) of the stool. You may have this test every year starting at age 56. Flexible sigmoidoscopy or colonoscopy. You may have a sigmoidoscopy every 5 years or a colonoscopy every 10 years starting at age 46. Prostate cancer screening. Recommendations will vary depending on your family history and other risks. Hepatitis C blood test. Hepatitis B blood test. Sexually transmitted disease (STD) testing. Diabetes screening. This is done by checking your blood sugar (glucose) after you have not eaten for a while (fasting). You may have this done every 1-3 years. Discuss your test results, treatment options, and if necessary, the need for more tests with your health care provider. Vaccines  Your health care provider may recommend certain vaccines, such as: Influenza vaccine. This is recommended every year. Tetanus, diphtheria, and acellular pertussis (Tdap, Td) vaccine. You may need a Td booster every 10 years. Zoster vaccine. You may need this after age 8. Pneumococcal 13-valent conjugate (PCV13) vaccine. You may need this if you have certain conditions and have not been vaccinated. Pneumococcal polysaccharide (PPSV23) vaccine. You may need one or two doses if you smoke cigarettes or if you have certain conditions. Talk to your health care provider about which screenings and vaccines you need and how often you need them. This information is not intended to replace advice given to you by your health care provider. Make sure you discuss  any questions you have with your health care  provider. Document Released: 02/24/2015 Document Revised: 10/18/2015 Document Reviewed: 11/29/2014 Elsevier Interactive Patient Education  2017 [place] Prevention in the Home Falls can cause injuries. They can happen to people of all ages. There are many things you can do to make your home safe and to help prevent falls. What can I do on the outside of my home? Regularly fix the edges of walkways and driveways and fix any cracks. Remove anything that might make you trip as you walk through a door, such as a raised step or threshold. Trim any bushes or trees on the path to your home. Use bright outdoor lighting. Clear any walking paths of anything that might make someone trip, such as rocks or tools. Regularly check to see if handrails are loose or broken. Make sure that both sides of any steps have handrails. Any raised decks and porches should have guardrails on the edges. Have any leaves, snow, or ice cleared regularly. Use sand or salt on walking paths during winter. Clean up any spills in your garage right away. This includes oil or grease spills. What can I do in the bathroom? Use night lights. Install grab bars by the toilet and in the tub and shower. Do not use towel bars as grab bars. Use non-skid mats or decals in the tub or shower. If you need to sit down in the shower, use a plastic, non-slip stool. Keep the floor dry. Clean up any water that spills on the floor as soon as it happens. Remove soap buildup in the tub or shower regularly. Attach bath mats securely with double-sided non-slip rug tape. Do not have throw rugs and other things on the floor that can make you trip. What can I do in the bedroom? Use night lights. Make sure that you have a light by your bed that is easy to reach. Do not use any sheets or blankets that are too big for your bed. They should not hang down onto the floor. Have a firm chair that has side arms. You can use this for support while  you get dressed. Do not have throw rugs and other things on the floor that can make you trip. What can I do in the kitchen? Clean up any spills right away. Avoid walking on wet floors. Keep items that you use a lot in easy-to-reach places. If you need to reach something above you, use a strong step stool that has a grab bar. Keep electrical cords out of the way. Do not use floor polish or wax that makes floors slippery. If you must use wax, use non-skid floor wax. Do not have throw rugs and other things on the floor that can make you trip. What can I do with my stairs? Do not leave any items on the stairs. Make sure that there are handrails on both sides of the stairs and use them. Fix handrails that are broken or loose. Make sure that handrails are as long as the stairways. Check any carpeting to make sure that it is firmly attached to the stairs. Fix any carpet that is loose or worn. Avoid having throw rugs at the top or bottom of the stairs. If you do have throw rugs, attach them to the floor with carpet tape. Make sure that you have a light switch at the top of the stairs and the bottom of the stairs. If you do not have them, ask someone to add  them for you. What else can I do to help prevent falls? Wear shoes that: Do not have high heels. Have rubber bottoms. Are comfortable and fit you well. Are closed at the toe. Do not wear sandals. If you use a stepladder: Make sure that it is fully opened. Do not climb a closed stepladder. Make sure that both sides of the stepladder are locked into place. Ask someone to hold it for you, if possible. Clearly mark and make sure that you can see: Any grab bars or handrails. First and last steps. Where the edge of each step is. Use tools that help you move around (mobility aids) if they are needed. These include: Canes. Walkers. Scooters. Crutches. Turn on the lights when you go into a dark area. Replace any light bulbs as soon as they burn  out. Set up your furniture so you have a clear path. Avoid moving your furniture around. If any of your floors are uneven, fix them. If there are any pets around you, be aware of where they are. Review your medicines with your doctor. Some medicines can make you feel dizzy. This can increase your chance of falling. Ask your doctor what other things that you can do to help prevent falls. This information is not intended to replace advice given to you by your health care provider. Make sure you discuss any questions you have with your health care provider. Document Released: 11/24/2008 Document Revised: 07/06/2015 Document Reviewed: 03/04/2014 Elsevier Interactive Patient Education  2017 [employer].

## 2020-10-03 NOTE — Progress Notes (Signed)
I connected with Eulas Post today by telephone and verified that I am speaking with the correct person using two identifiers. Location patient: home Location provider: work Persons participating in the virtual visit: Geovanie Winnett, Braddock Servellon (wife), Elisha Ponder LPN.   I discussed the limitations, risks, security and privacy concerns of performing an evaluation and management service by telephone and the availability of in person appointments. I also discussed with the patient that there may be a patient responsible charge related to this service. The patient expressed understanding and verbally consented to this telephonic visit.    Interactive audio and video telecommunications were attempted between this provider and patient, however failed, due to patient having technical difficulties OR patient did not have access to video capability.  We continued and completed visit with audio only.     Vital signs may be patient reported or missing.  Subjective:   Joel [name] is a 40 y.o. male who presents for an Initial Medicare Annual Wellness Visit.  Review of Systems     Cardiac Risk Factors include: diabetes mellitus;hypertension;male gender;sedentary lifestyle;obesity (BMI >30kg/m2)     Objective:    Today's Vitals   10/03/20 1436  Weight: 230 lb (104.3 kg)  Height: 5\' 7"  (1.702 m)   Body mass index is 36.02 kg/m.  Advanced Directives 10/03/2020 03/19/2019 03/03/2019 11/13/2018 03/03/2017 02/12/2017 10/11/2014  Does Patient Have a Medical Advance Directive? No No No No No No No  Would patient like information on creating a medical advance directive? - No - Patient declined No - Patient declined No - Patient declined - No - Patient declined No - patient declined information    Current Medications (verified) Outpatient Encounter Medications as of 10/03/2020  Medication Sig   amLODipine (NORVASC) 5 MG tablet Take 1 tablet (5 mg total) by mouth daily.   carvedilol (COREG) 12.5 MG  tablet Take 12.5 mg by mouth 2 (two) times daily with a meal.   clopidogrel (PLAVIX) 75 MG tablet Take 1 tablet (75 mg total) by mouth daily.   Evolocumab (REPATHA) 140 MG/ML SOSY Inject 140 mg into the skin every 14 (fourteen) days.   loratadine (CLARITIN) 10 MG tablet Take 10 mg by mouth daily.   omeprazole (PRILOSEC) 20 MG capsule Take 20 mg by mouth daily.   rosuvastatin (CRESTOR) 40 MG tablet Take 40 mg by mouth daily.   sertraline (ZOLOFT) 100 MG tablet Take 200 mg by mouth daily.   prazosin (MINIPRESS) 1 MG capsule Take 1 mg by mouth at bedtime. (Patient not taking: Reported on 10/03/2020)   No facility-administered encounter medications on file as of 10/03/2020.    Allergies (verified) Bidil [isosorb dinitrate-hydralazine] and Nitroglycerin   History: Past Medical History:  Diagnosis Date   Anxiety    Chest pain    Chronic diastolic CHF (congestive heart failure) (HCC)    Constipation    Depression    GERD (gastroesophageal reflux disease)    Hyperlipidemia    Hypertension    Hypertensive heart disease    Lactose intolerance    Noncompliance with medications    Obesity    Palpitations    Renal disorder    Shortness of breath on exertion    Sleep apnea    Stroke (HCC)    Systolic dysfunction    a. TTE 06/2014: EF 45-50%, normal wall motion, mild MR, mild AI   Past Surgical History:  Procedure Laterality Date   NO PAST SURGERIES     Family History  Problem Relation  Age of Onset   Cancer Mother    Hypertension Mother    Alcoholism Father    Hyperlipidemia Father    Diabetes Father    CAD Father        a. stenting in his early 14s   Heart disease Father    Stroke Father    Heart attack Father    Diabetes Maternal Grandmother    Diabetes Maternal Grandfather    CAD Paternal Grandmother    CAD Paternal Grandfather    Colon cancer Paternal Grandfather    Diabetes Paternal Uncle    Social History   Socioeconomic History   Marital status: Married    Spouse  name: Not on file   Number of children: 1   Years of education: 12   Highest education level: 12th grade  Occupational History   Occupation: Disabled  Tobacco Use   Smoking status: Never   Smokeless tobacco: Never  Building services engineer Use: Never used  Substance and Sexual Activity   Alcohol use: Not Currently    Comment: Very infrequently; ~2-3 drinks/year    Drug use: No   Sexual activity: Not Currently    Birth control/protection: None  Other Topics Concern   Not on file  Social History Narrative   Not on file   Social Determinants of Health   Financial Resource Strain: Low Risk    Difficulty of Paying Living Expenses: Not hard at all  Food Insecurity: Food Insecurity Present   Worried About Programme researcher, broadcasting/film/video in the Last Year: Sometimes true   Barista in the Last Year: Sometimes true  Transportation Needs: No Transportation Needs   Lack of Transportation (Medical): No   Lack of Transportation (Non-Medical): No  Physical Activity: Inactive   Days of Exercise per Week: 0 days   Minutes of Exercise per Session: 0 min  Stress: No Stress Concern Present   Feeling of Stress : Not at all  Social Connections: Not on file    Tobacco Counseling Counseling given: Not Answered   Clinical Intake:  Pre-visit preparation completed: Yes  Pain : No/denies pain     Nutritional Status: BMI > 30  Obese Nutritional Risks: None Diabetes: Yes  How often do you need to have someone help you when you read instructions, pamphlets, or other written materials from your doctor or pharmacy?: 1 - Never What is the last grade level you completed in school?: 12th grade  Diabetic? Yes Nutrition Risk Assessment:  Has the patient had any N/V/D within the last 2 months?  No  Does the patient have any non-healing wounds?  No  Has the patient had any unintentional weight loss or weight gain?  Yes   Diabetes:  Is the patient diabetic?  Yes  If diabetic, was a CBG obtained  today?  No  Did the patient bring in their glucometer from home?  No  How often do you monitor your CBG's? Does not check.   Financial Strains and Diabetes Management:  Are you having any financial strains with the device, your supplies or your medication? No .  Does the patient want to be seen by Chronic Care Management for management of their diabetes?  No  Would the patient like to be referred to a Nutritionist or for Diabetic Management?  No   Diabetic Exams:  Diabetic Eye Exam: Overdue for diabetic eye exam. Pt has been advised about the importance in completing this exam. Patient advised to call and  schedule an eye exam. Diabetic Foot Exam: Overdue, Pt has been advised about the importance in completing this exam. Pt is scheduled for diabetic foot exam on next appointment.   Interpreter Needed?: No  Information entered by :: NAllen LPN   Activities of Daily Living In your present state of health, do you have any difficulty performing the following activities: 10/03/2020  Hearing? Y  Comment buzzing in right ear  Vision? Y  Comment spot in left eye  Difficulty concentrating or making decisions? Y  Walking or climbing stairs? N  Dressing or bathing? N  Doing errands, shopping? Y  Comment wife goes with  Quarry manager and eating ? N  Using the Toilet? N  In the past six months, have you accidently leaked urine? N  Do you have problems with loss of bowel control? N  Managing your Medications? Y  Comment wife manages  Managing your Finances? Y  Housekeeping or managing your Housekeeping? Y  Some recent data might be hidden    Patient Care Team: [name], DO as PCP - General (Family Medicine) Mariah Milling Tollie Pizza, MD as PCP - Cardiology (Cardiology) Tedd Sias Marlana Salvage, MD as Physician Assistant (Endocrinology)  Indicate any recent Medical Services you may have received from other than [facility] providers in the past year (date may be approximate).     Assessment:    This is a routine wellness examination for Khush.  Hearing/Vision screen Vision Screening - Comments:: No regular eye exams,  Dietary issues and exercise activities discussed: Current Exercise Habits: The patient does not participate in regular exercise at present   Goals Addressed             This Visit's Progress    Patient Stated       10/03/2020, wants to weigh 200 pounds       Depression Screen PHQ 2/9 Scores 10/03/2020 07/25/2020 06/04/2019 04/21/2019 03/03/2019 02/22/2019 11/30/2018  PHQ - 2 Score 6 6 0 4 3 3 5   PHQ- 9 Score 20 21 - 19 18 11 22     Fall Risk Fall Risk  10/03/2020 06/04/2019 03/03/2019 12/28/2018 11/13/2018  Falls in the past year? 1 0 0 0 0  Comment lost balance - - - -  Number falls in past yr: 1 0 0 0 -  Injury with Fall? 0 0 0 0 -  Risk for fall due to : Impaired balance/gait;Medication side effect - - - -  Follow up Falls evaluation completed;Education provided;Falls prevention discussed Falls evaluation completed Falls evaluation completed - -    FALL RISK PREVENTION PERTAINING TO THE HOME:  Any stairs in or around the home? Yes  If so, are there any without handrails? No  Home free of loose throw rugs in walkways, pet beds, electrical cords, etc? Yes  Adequate lighting in your home to reduce risk of falls? Yes   ASSISTIVE DEVICES UTILIZED TO PREVENT FALLS:  Life alert? No  Use of a cane, walker or w/c? No  Grab bars in the bathroom? No  Shower chair or bench in shower? Yes  Elevated toilet seat or a handicapped toilet? No   TIMED UP AND GO:  Was the test performed? No .      Cognitive Function:        Immunizations Immunization History  Administered Date(s) Administered   Influenza,inj,Quad PF,6+ Mos 02/14/2017   Pneumococcal Polysaccharide-23 06/14/2014, 02/14/2017   Tdap 06/14/2014    TDAP status: Up to date  Flu Vaccine status: Declined, Education  has been provided regarding the importance of this vaccine but patient  still declined. Advised may receive this vaccine at local pharmacy or Health Dept. Aware to provide a copy of the vaccination record if obtained from local pharmacy or Health Dept. Verbalized acceptance and understanding.  Pneumococcal vaccine status: Up to date  Covid-19 vaccine status: Declined, Education has been provided regarding the importance of this vaccine but patient still declined. Advised may receive this vaccine at local pharmacy or Health Dept.or vaccine clinic. Aware to provide a copy of the vaccination record if obtained from local pharmacy or Health Dept. Verbalized acceptance and understanding.  Qualifies for Shingles Vaccine? No   Zostavax completed No   Shingrix Completed?: no  Screening Tests Health Maintenance  Topic Date Due   Hepatitis C Screening  Never done   FOOT EXAM  12/20/2018   OPHTHALMOLOGY EXAM  02/26/2019   COVID-19 Vaccine (1) 10/19/2020 (Originally 10/17/1985)   INFLUENZA VACCINE  11/19/2020 (Originally 09/11/2020)   HEMOGLOBIN A1C  11/10/2020   TETANUS/TDAP  06/13/2024   PNEUMOCOCCAL POLYSACCHARIDE VACCINE AGE 11-64 HIGH RISK  Completed   HIV Screening  Completed   Pneumococcal Vaccine 85-70 Years old  Aged Out   HPV VACCINES  Aged Out    Health Maintenance  Health Maintenance Due  Topic Date Due   Hepatitis C Screening  Never done   FOOT EXAM  12/20/2018   OPHTHALMOLOGY EXAM  02/26/2019    Colorectal cancer screening: No longer required.   Lung Cancer Screening: (Low Dose CT Chest recommended if Age 82-80 years, 30 pack-year currently smoking OR have quit w/in 15years.) does not qualify.   Lung Cancer Screening Referral: no  Additional Screening:  Hepatitis C Screening: does qualify; due  Vision Screening: Recommended annual ophthalmology exams for early detection of glaucoma and other disorders of the eye. Is the patient up to date with their annual eye exam?  No  Who is the provider or what is the name of the office in which the patient  attends annual eye exams? none If pt is not established with a provider, would they like to be referred to a provider to establish care? No .   Dental Screening: Recommended annual dental exams for proper oral hygiene  Community Resource Referral / Chronic Care Management: CRR required this visit?  No   CCM required this visit?  No      Plan:     I have personally reviewed and noted the following in the patient's chart:   Medical and social history Use of alcohol, tobacco or illicit drugs  Current medications and supplements including opioid prescriptions. Patient is not currently taking opioid prescriptions. Functional ability and status Nutritional status Physical activity Advanced directives List of other physicians Hospitalizations, surgeries, and ER visits in previous 12 months Vitals Screenings to include cognitive, depression, and falls Referrals and appointments  In addition, I have reviewed and discussed with patient certain preventive protocols, quality metrics, and best practice recommendations. A written personalized care plan for preventive services as well as general preventive health recommendations were provided to patient.     Barb Merino, LPN   [id number]   Nurse Notes: 6 CIT not administered. Patient states that he has trouble with memory due to stroke.

## 2020-10-06 ENCOUNTER — Encounter: Payer: Self-pay | Admitting: Family Medicine

## 2020-10-09 NOTE — Progress Notes (Signed)
Date:  10/10/2020   ID:  [name], DOB [birth year], MRN [id number]  Patient Location:  [address] [place] [address]   Provider location:   [facility], [place] office  PCP:  [name], DO  Cardiologist:  Hubbard Robinson [facility]  Chief Complaint  Patient presents with   87-month follow up    6 month follow up     Patient c/o congestion and cough. Medications reviewed by the patient verbally.     History of Present Illness:    Joel [name] is a 40 y.o. 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  hypertensive heart disease,  DM, followed by endocrine HLD,  medication noncompliance,  obesity,  sleep apena on CPAP Hospital admission February 13, 2017 acute on chronic combined CHF hypertensive emergency. Last echocardiogram 2021 ejection fraction 55% He presents for follow-up of his systolic and diastolic CHF  Last seen in clinic February 2022 On his last clinic visit had stopped all of his medications including diabetes and cholesterol Trying to lose weight  Weight had dropped to 254 down to 224 Lowest weight to 188 in 05/2020 Was missing meals, severe calorie restriction Has gained weight back since that time, dealing with depression Now working with weight management to lose weight in a more orderly fashion Weight up 10 pounds over the past month or 2  Allergies, congestion Benadryl, allergy medication  Labs reviewed CR 1.59, down from 2 A1C 5.3  Has follow-up with endocrine in the next month or 2  Now on repatha and crestor Completed 3 doses  EKG personally reviewed by myself on todays visit Shows normal sinus rhythm rate 76 bpm poor R wave progression  to the anterior precordial leads left axis deviation, unchanged  Other past medical history reviewed Acute CVA 02/2019 facial numbness and dizziness and right-sided weakness  2D echo showed normal ejection fraction without cardiac source of embolism.  Seen by neurology Recommend dual antiplatelet therapy with aspirin and Plavix for 3 weeks followed by Plavix alone  MR ANGIO HEAD WO CONTRAST 03/20/2019 1. No large vessel occlusion. Possible occlusion of the right AICA, or it could be diminutive or absent.  2. Severe stenosis versus artifact in the left PCA distal P2/proximal P3 segment.  3. Suggestion of intracranial atherosclerosis involving the anterior and posterior circulation, including mild irregularity of the: distal right vertebral artery, basilar artery, supraclinoid right ICA, and bilateral MCA branches.  4. Non dominant left vertebral artery functionally terminates in PICA. Fetal type bilateral PCA origins.    MR ANGIO NECK   03/20/2019 Negative neck MRA. Dominant right vertebral artery.    MR BRAIN 03/19/2019 Small acute infarction involving the right middle cerebellar peduncle. Corresponding enhancement may reflect early blood brain barrier disruption. Given presence of vascular risk factors, ischemia is favored over demyelination. There is also a probable small chronic infarct near the right frontal horn.   Presented to the hospital 02/12/17/18 due to HF and HTN.  Hypertensive urgency on arrival Echo report from 02/12/17 EF of 25-30%  Aggressive diuresis, blood pressure control  Echo 02/2017 Left ventricle: The cavity size was normal. There was moderate   concentric hypertrophy. Systolic function was severely reduced.   The estimated ejection fraction was in the  range of 25% to 30%.   Diffuse hypokinesis. Regional wall motion abnormalities cannot be   excluded. Features are consistent with a pseudonormal left   ventricular filling pattern, with concomitant abnormal relaxation    and increased filling pressure (grade 2 diastolic dysfunction).   Past Medical History:  Diagnosis Date   Anxiety    Chest pain    Chronic diastolic CHF (congestive heart failure) (HCC)    Constipation    Depression    GERD (gastroesophageal reflux disease)    Hyperlipidemia    Hypertension    Hypertensive heart disease    Lactose intolerance    Noncompliance with medications    Obesity    Palpitations    Renal disorder    Shortness of breath on exertion    Sleep apnea    Stroke [facility])    Systolic dysfunction    a. TTE 06/2014: EF 45-50%, normal wall motion, mild MR, mild AI   Past Surgical History:  Procedure Laterality Date   NO PAST SURGERIES       Current Meds  Medication Sig   Evolocumab (REPATHA) 140 MG/ML SOSY Inject 140 mg into the skin every 14 (fourteen) days.   loratadine (CLARITIN) 10 MG tablet Take 10 mg by mouth daily.   omeprazole (PRILOSEC) 20 MG capsule Take 20 mg by mouth daily.   sertraline (ZOLOFT) 100 MG tablet Take 200 mg by mouth daily.   [DISCONTINUED] amLODipine (NORVASC) 5 MG tablet Take 1 tablet (5 mg total) by mouth daily.   [DISCONTINUED] carvedilol (COREG) 12.5 MG tablet Take 12.5 mg by mouth 2 (two) times daily with a meal.   [DISCONTINUED] clopidogrel (PLAVIX) 75 MG tablet Take 1 tablet (75 mg total) by mouth daily.   [DISCONTINUED] rosuvastatin (CRESTOR) 40 MG tablet Take 40 mg by mouth daily.     Allergies:   Bidil [isosorb dinitrate-hydralazine] and Nitroglycerin , H/A  Social History   Tobacco Use   Smoking status: Never   Smokeless tobacco: Never  Vaping Use   Vaping Use: Never used  Substance Use Topics   Alcohol use: Not Currently    Comment: Very infrequently; ~2-3 drinks/year    Drug use: No    Family Hx: The patient's family history includes Alcoholism in his father; CAD in his father, paternal grandfather, and paternal grandmother; Cancer in his mother; Colon cancer in his paternal grandfather; Diabetes in his father,  maternal grandfather, maternal grandmother, and paternal uncle; Heart attack in his father; Heart disease in his father; Hyperlipidemia in his father; Hypertension in his mother; Stroke in his father.  ROS:   Please see the history of present illness.    Review of Systems  Constitutional:        Weight gain  HENT: Negative.    Respiratory: Negative.    Cardiovascular: Negative.   Gastrointestinal: Negative.   Musculoskeletal: Negative.   Neurological: Negative.   Psychiatric/Behavioral: Negative.    All other systems reviewed and are negative.   Labs/Other Tests and Data Reviewed:    Recent Labs: 07/25/2020: ALT 16; BUN 22; Creatinine, Ser 1.59; Hemoglobin 14.6; Platelets 224; Potassium 4.0; Sodium 141; TSH 1.980   Recent Lipid Panel Lab Results  Component Value Date/Time   CHOL 221 (H) 07/25/2020 04:28 PM   CHOL 361 (H) 06/11/2014 12:32 PM   TRIG 69 07/25/2020 04:28 PM   TRIG 246 (H) 06/11/2014 12:32 PM   HDL 59 07/25/2020 04:28 PM   HDL 41 06/11/2014 12:32 PM   CHOLHDL 12.6 03/20/2019  01:13 AM   LDLCALC 150 (H) 07/25/2020 04:28 PM   LDLCALC 271 (H) 06/11/2014 12:32 PM    Wt Readings from Last 3 Encounters:  10/10/20 232 lb 6 oz (105.4 kg)  10/03/20 230 lb (104.3 kg)  09/27/20 221 lb (100.2 kg)     Exam:    Vital Signs: Vital signs may also be detailed in the HPI BP (!) 130/100 (BP Location: Left Arm, Patient Position: Sitting, Cuff Size: Normal)   Pulse 76   Ht 5\' 7"  (1.702 m)   Wt 232 lb 6 oz (105.4 kg)   SpO2 98%   BMI 36.40 kg/m    Constitutional:  oriented to person, place, and time. No distress.  HENT:  Head: Grossly normal Eyes:  no discharge. No scleral icterus.  Neck: No JVD, no carotid bruits  Cardiovascular: Regular rate and rhythm, no murmurs appreciated Pulmonary/Chest: Clear to auscultation bilaterally, no wheezes or rails Abdominal: Soft.  no distension.  no tenderness.  Musculoskeletal: Normal range of motion Neurological:  normal muscle  tone. Coordination normal. No atrophy Skin: Skin warm and dry Psychiatric: normal affect, pleasant   ASSESSMENT & PLAN:    Dilated cardiomyopathy (HCC) EF has fluctuated 45 to 50% then dropped 25 to 30% in 2019 Ejection fraction back up to normal January 2021 holding off on Entresto ACE ARB given renal failure Recommend he continue amlodipine but increase dose up to 10 mg daily given elevated diastolic pressures Check pressure in 2 weeks time if still elevated diastolic would increase carvedilol up to 25 twice daily.  New prescriptions provided  systolic CHF, chronic (HCC) -  Appears euvolemic Normal ejection fraction January 2021 Plan as below  Essential hypertension intolerance to BiDil, isosorbide Avoiding ACE, ARB, Entresto in the setting of renal failure Changes as above.  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

## 2020-10-10 ENCOUNTER — Other Ambulatory Visit: Payer: Self-pay

## 2020-10-10 ENCOUNTER — Ambulatory Visit (INDEPENDENT_AMBULATORY_CARE_PROVIDER_SITE_OTHER): Payer: Medicare Other | Admitting: Cardiovascular Disease

## 2020-10-10 ENCOUNTER — Encounter: Payer: Self-pay | Admitting: Cardiovascular Disease

## 2020-10-10 VITALS — BP 130/100 | HR 76 | Ht 67.0 in | Wt 232.4 lb

## 2020-10-10 DIAGNOSIS — N1832 Chronic kidney disease, stage 3b: Secondary | ICD-10-CM | POA: Diagnosis not present

## 2020-10-10 DIAGNOSIS — I639 Cerebral infarction, unspecified: Secondary | ICD-10-CM

## 2020-10-10 DIAGNOSIS — I5022 Chronic systolic (congestive) heart failure: Secondary | ICD-10-CM | POA: Diagnosis not present

## 2020-10-10 DIAGNOSIS — I110 Hypertensive heart disease with heart failure: Secondary | ICD-10-CM | POA: Diagnosis not present

## 2020-10-10 DIAGNOSIS — I420 Dilated cardiomyopathy: Secondary | ICD-10-CM

## 2020-10-10 MED ORDER — AMLODIPINE BESYLATE 10 MG PO TABS: 10.0000 mg | ORAL_TABLET | Freq: Every day | ORAL | 3 refills | Status: DC

## 2020-10-10 MED ORDER — ROSUVASTATIN CALCIUM 40 MG PO TABS: 40.0000 mg | ORAL_TABLET | Freq: Every day | ORAL | 3 refills | Status: DC

## 2020-10-10 MED ORDER — CLOPIDOGREL BISULFATE 75 MG PO TABS: 75.0000 mg | ORAL_TABLET | Freq: Every day | ORAL | 3 refills | Status: DC

## 2020-10-10 MED ORDER — CARVEDILOL 25 MG PO TABS: 25.0000 mg | ORAL_TABLET | Freq: Two times a day (BID) | ORAL | 3 refills | Status: DC

## 2020-10-10 NOTE — Patient Instructions (Addendum)
Medication Instructions:  Please increase amlodipine up to 10 mg daily  Monitor blood pressurye If elevated (diastolic >90), Please increase the coreg up to 25 twice a day  If you need a refill on your cardiac medications before your next appointment, please call your pharmacy.    Lab work: No new labs needed  Testing/Procedures: No new testing needed   Follow-Up: At [facility], you and your health needs are our priority.  As part of our continuing mission to provide you with exceptional heart care, we have created designated Provider Care Teams.  These Care Teams include your primary Cardiologist (physician) and Advanced Practice Providers (APPs -  Physician Assistants and Nurse Practitioners) who all work together to provide you with the care you need, when you need it.  You will need a follow up appointment in 3 months  Providers on your designated Care Team:   Nicolasa Ducking, NP Eula Listen, PA-C Marisue Ivan, PA-C [place], [place]  COVID-19 Vaccine Information can be found at: [email or web address] For questions related to vaccine distribution or appointments, please email [email or web address] or call [phone].

## 2020-10-10 NOTE — Progress Notes (Unsigned)
  Office: [phone]  /  Fax: [phone]    Date: October 24, 2020   Appointment Start Time: *** Duration: *** minutes Provider: Lawerance Cruel, Psy.D. Type of Session: Individual Therapy  Location of Patient: {gbptloc:23249} Location of Provider: Provider's Home (private office) Type of Contact: Telepsychological Visit via {MyChart Video Visit  Session Content: Joshwa is a 40 y.o. male presenting for a follow-up appointment to address the previously established treatment goal of increasing coping skills. Today's appointment was a telepsychological visit due to COVID-19. Moe provided verbal consent for today's telepsychological appointment and he is aware he is responsible for securing confidentiality on his end of the session. Prior to proceeding with today's appointment, Ariyan's physical location at the time of this appointment was obtained as well a phone number he could be reached at in the event of technical difficulties. Maze and this provider participated in today's telepsychological service.   This provider conducted a brief check-in. *** Langley was receptive to today's appointment as evidenced by openness to sharing, responsiveness to feedback, and {gbreceptiveness:23401}.  Mental Status Examination:  Appearance: {Appearance:22431} Behavior: {Behavior:22445} Mood: {gbmood:21757} Affect: {Affect:22436} Speech: {Speech:22432} Eye Contact: {Eye Contact:22433} Psychomotor Activity: {Motor Activity:22434} Gait: {gbgait:23404} Thought Process: {thought process:22448}  Thought Content/Perception: {disturbances:22451} Orientation: {Orientation:22437} Memory/Concentration: {gbcognition:22449} Insight/Judgment: {Insight:22446}  Interventions:  {Interventions for Progress Notes:23405}  DSM-5 Diagnosis(es):  F33.2 Major Depressive Disorder, Recurrent Episode, Severe, With Anxious Distress, 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 Stepan has demonstrated progress in his goal as evidenced by {gbtxprogress:22839}. Michaeljohn also {gbtxprogress2:22951}.  Plan: The next appointment will be scheduled in {gbweeks:21758}, which will be {gbtxmodality:23402}. The next session will focus on {Plan for Next Appointment:23400}.

## 2020-10-11 ENCOUNTER — Ambulatory Visit (INDEPENDENT_AMBULATORY_CARE_PROVIDER_SITE_OTHER): Payer: Medicare Other | Admitting: Family Medicine

## 2020-10-11 ENCOUNTER — Encounter (INDEPENDENT_AMBULATORY_CARE_PROVIDER_SITE_OTHER): Payer: Self-pay

## 2020-10-11 ENCOUNTER — Encounter: Payer: Self-pay | Admitting: Family Medicine

## 2020-10-11 VITALS — BP 135/96 | HR 86 | Ht 67.0 in | Wt 239.6 lb

## 2020-10-11 DIAGNOSIS — T485X5A Adverse effect of other anti-common-cold drugs, initial encounter: Secondary | ICD-10-CM

## 2020-10-11 DIAGNOSIS — J3089 Other allergic rhinitis: Secondary | ICD-10-CM | POA: Diagnosis not present

## 2020-10-11 DIAGNOSIS — Z5329 Procedure and treatment not carried out because of patient's decision for other reasons: Secondary | ICD-10-CM

## 2020-10-11 DIAGNOSIS — J310 Chronic rhinitis: Secondary | ICD-10-CM | POA: Diagnosis not present

## 2020-10-11 MED ORDER — MONTELUKAST SODIUM 10 MG PO TABS: 10.0000 mg | ORAL_TABLET | Freq: Every day | ORAL | 3 refills | Status: DC

## 2020-10-11 MED ORDER — AZELASTINE HCL 0.1 % NA SOLN: 1.0000 | Freq: Two times a day (BID) | NASAL | 12 refills | Status: DC

## 2020-10-11 NOTE — Patient Instructions (Addendum)
Thank you for coming to the office today.  Gradually wean down Afrin if able.  Start Singulair 10mg  nightly  Start Azelastine (Astelin) allergy anti histamine nasal 1 spray twice a day  Can keep Claritin daily for now.   Please schedule a Follow-up Appointment to: Return if symptoms worsen or fail to improve.  If you have any other questions or concerns, please feel free to call the office or send a message through MyChart. You may also schedule an earlier appointment if necessary.  Additionally, you may be receiving a survey about your experience at our office within a few days to 1 week by e-mail or mail. We value your feedback.  , DO [facility], [facility]

## 2020-10-11 NOTE — Progress Notes (Signed)
Subjective:    Patient ID: Joel [name], male    DOB: Aug 25, 1980, 40 y.o.   MRN: [id number]  Joel [name] is a 40 y.o. male presenting on 10/11/2020 for Allergies   HPI  Environmental Seasonal Allergies No regular history of allergies. Now as classic symptoms worse in the summer 2022, sinus congestion drainage pressure itchy watery eyes Tried Claritin, Benadryl, OTC allergy DayQuil/NyQuil He admits using Afin heavy usage regularly for years, multiple times a day. He admits to rebound congestion with Afrin use. Admits red itchy scratch eyes, nasal congestion Admits post nasal drainage - has improved. Admits sneezing x 6 episodes. Denies fever cough dyspnea   Depression screen [facility] 2/9 10/11/2020 10/03/2020 07/25/2020  Decreased Interest _0 Down, Depressed, Hopeless _1 PHQ - 2 Score _2 Altered sleeping _3 Tired, decreased energy _4 Change in appetite _5 Feeling bad or failure about yourself  _6 Trouble concentrating 2 2 0  Moving slowly or fidgety/restless 0 0 3  Suicidal thoughts 0 0 3  PHQ-9 Score _7 Difficult doing work/chores Extremely dIfficult Somewhat difficult Extremely dIfficult  Some recent data might be hidden    Social History   Tobacco Use   Smoking status: Never   Smokeless tobacco: Never  Vaping Use   Vaping Use: Never used  Substance Use Topics   Alcohol use: Not Currently    Comment: Very infrequently; ~2-3 drinks/year    Drug use: No    Review of Systems Per HPI unless specifically indicated above     Objective:    BP (!) 135/96   Pulse 86   Ht _8  (1.702 m)   Wt 239 lb 9.6 oz (108.7 kg)   SpO2 99%   BMI 37.53 kg/m   Wt Readings from Last 3 Encounters:  10/11/20 239 lb 9.6 oz (108.7 kg)  10/10/20 232 lb 6 oz (105.4 kg)  10/03/20 230 lb (104.3 kg)    Physical Exam Vitals and nursing note reviewed.  Constitutional:      General: He is not in acute distress.    Appearance: Normal appearance. He is  well-developed. He is not diaphoretic.     Comments: Well-appearing, comfortable, cooperative  HENT:     Head: Normocephalic and atraumatic.  Eyes:     General:        Right eye: No discharge.        Left eye: No discharge.     Conjunctiva/sclera: Conjunctivae normal.  Cardiovascular:     Rate and Rhythm: Normal rate.  Pulmonary:     Effort: Pulmonary effort is normal.  Skin:    General: Skin is warm and dry.     Findings: No erythema or rash.  Neurological:     Mental Status: He is alert and oriented to person, place, and time.  Psychiatric:        Mood and Affect: Mood normal.        Behavior: Behavior normal.        Thought Content: Thought content normal.     Comments: Well groomed, good eye contact, normal speech and thoughts   Results for orders placed or performed in visit on 07/25/20  CBC with Differential/Platelet  Result Value Ref Range   WBC 4.3 3.4 - 10.8 x10E3/uL   RBC 5.15 4.14 - 5.80 x10E6/uL   Hemoglobin 14.6 13.0 - 17.7 g/dL  Hematocrit 44.0 37.5 - 51.0 %   MCV 85 79 - 97 fL   MCH 28.3 26.6 - 33.0 pg   MCHC 33.2 31.5 - 35.7 g/dL   RDW 14.6 11.6 - 15.4 %   Platelets 224 150 - 450 x10E3/uL   Neutrophils 45 Not Estab. %   Lymphs 43 Not Estab. %   Monocytes 7 Not Estab. %   Eos 4 Not Estab. %   Basos 1 Not Estab. %   Neutrophils Absolute 1.9 1.4 - 7.0 x10E3/uL   Lymphocytes Absolute 1.9 0.7 - 3.1 x10E3/uL   Monocytes Absolute 0.3 0.1 - 0.9 x10E3/uL   EOS (ABSOLUTE) 0.2 0.0 - 0.4 x10E3/uL   Basophils Absolute 0.0 0.0 - 0.2 x10E3/uL   Immature Granulocytes 0 Not Estab. %   Immature Grans (Abs) 0.0 0.0 - 0.1 x10E3/uL  Comprehensive metabolic panel  Result Value Ref Range   Glucose 107 (H) 65 - 99 mg/dL   BUN 22 (H) 6 - 20 mg/dL   Creatinine, Ser 1.59 (H) 0.76 - 1.27 mg/dL   eGFR 57 (L) >59 mL/min/1.73   BUN/Creatinine Ratio 14 9 - 20   Sodium 141 134 - 144 mmol/L   Potassium 4.0 3.5 - 5.2 mmol/L   Chloride 100 96 - 106 mmol/L   CO2 24 20 - 29  mmol/L   Calcium 9.2 8.7 - 10.2 mg/dL   Total Protein 7.1 6.0 - 8.5 g/dL   Albumin 4.5 4.0 - 5.0 g/dL   Globulin, Total 2.6 1.5 - 4.5 g/dL   Albumin/Globulin Ratio 1.7 1.2 - 2.2   Bilirubin Total 0.5 0.0 - 1.2 mg/dL   Alkaline Phosphatase 77 44 - 121 IU/L   AST 17 0 - 40 IU/L   ALT 16 0 - 44 IU/L  Lipid Panel With LDL/HDL Ratio  Result Value Ref Range   Cholesterol, Total 221 (H) 100 - 199 mg/dL   Triglycerides 69 0 - 149 mg/dL   HDL 59 >39 mg/dL   VLDL Cholesterol Cal 12 5 - 40 mg/dL   LDL Chol Calc (NIH) 150 (H) 0 - 99 mg/dL   LDL/HDL Ratio 2.5 0.0 - 3.6 ratio  T4, free  Result Value Ref Range   Free T4 0.92 0.82 - 1.77 ng/dL  TSH  Result Value Ref Range   TSH 1.980 0.450 - 4.500 uIU/mL  Insulin, random  Result Value Ref Range   INSULIN 10.5 2.6 - 24.9 uIU/mL      Assessment & Plan:   Problem List Items Addressed This Visit   None Visit Diagnoses     Seasonal allergic rhinitis due to other allergic trigger    -  Primary   Relevant Medications   montelukast (SINGULAIR) 10 MG tablet   azelastine (ASTELIN) 0.1 % nasal spray   Rhinitis medicamentosa           Seems new chronic allergy symptoms Inadequately controlled on OTCs Failed Flonase in past. Strong concern for rhinitis medicamentosa with long history of years of heavy afrin use with described rebound symptoms.  Start Montelukast 8m nightly Start Azelastine anti histamine nasal 1 spray BID Decrease Afrin / gradual taper to avoid future rebound. Unlikely to improve while still on Afrin. May continue Claritin  Meds ordered this encounter  Medications   montelukast (SINGULAIR) 10 MG tablet    Sig: Take 1 tablet (10 mg total) by mouth at bedtime.    Dispense:  90 tablet    Refill:  3   azelastine (ASTELIN) 0.1 %  nasal spray    Sig: Place 1 spray into both nostrils 2 (two) times daily. Use in each nostril as directed    Dispense:  30 mL    Refill:  12      Follow up plan: Return if symptoms worsen  or fail to improve.   Nobie Putnam, [facility] 10/11/2020, 9:19 AM

## 2020-10-17 ENCOUNTER — Ambulatory Visit (INDEPENDENT_AMBULATORY_CARE_PROVIDER_SITE_OTHER): Payer: Medicare Other | Admitting: Family Medicine

## 2020-10-24 ENCOUNTER — Telehealth (INDEPENDENT_AMBULATORY_CARE_PROVIDER_SITE_OTHER): Payer: Medicare Other | Admitting: Psychology

## 2020-10-30 ENCOUNTER — Other Ambulatory Visit: Payer: Self-pay

## 2020-10-30 ENCOUNTER — Ambulatory Visit (INDEPENDENT_AMBULATORY_CARE_PROVIDER_SITE_OTHER): Payer: Medicare Other | Admitting: Adult Health

## 2020-10-30 ENCOUNTER — Encounter: Payer: Self-pay | Admitting: Adult Health

## 2020-10-30 ENCOUNTER — Telehealth: Payer: Self-pay | Admitting: Adult Health

## 2020-10-30 VITALS — BP 143/98 | HR 80 | Ht 67.0 in | Wt 247.0 lb

## 2020-10-30 DIAGNOSIS — H9041 Sensorineural hearing loss, unilateral, right ear, with unrestricted hearing on the contralateral side: Secondary | ICD-10-CM

## 2020-10-30 DIAGNOSIS — I639 Cerebral infarction, unspecified: Secondary | ICD-10-CM

## 2020-10-30 DIAGNOSIS — R29818 Other symptoms and signs involving the nervous system: Secondary | ICD-10-CM | POA: Diagnosis not present

## 2020-10-30 DIAGNOSIS — R4701 Aphasia: Secondary | ICD-10-CM

## 2020-10-30 DIAGNOSIS — R4782 Fluency disorder in conditions classified elsewhere: Secondary | ICD-10-CM

## 2020-10-30 DIAGNOSIS — I672 Cerebral atherosclerosis: Secondary | ICD-10-CM

## 2020-10-30 NOTE — Patient Instructions (Signed)
We will obtain imaging of your head and neck to rule out any new stroke  Continue clopidogrel 75 mg daily  and Repatha and Crestor  for secondary stroke prevention  Continue to follow up with PCP regarding cholesterol and blood pressure management  Maintain strict control of hypertension with blood pressure goal below 130/90 and cholesterol with LDL cholesterol (bad cholesterol) goal below 70 mg/dL.      Followup in the future with me in 4 months or call earlier if needed     Thank you for coming to see [place] at [facility]. I hope we have been able to provide you high quality care today.  You may receive a patient satisfaction survey over the next few weeks. We would appreciate your feedback and comments so that we may continue to improve ourselves and the health of our patients.

## 2020-10-30 NOTE — Progress Notes (Signed)
[facility] [address] [place]. [place] [address] [phone]       STROKE FOLLOW UP NOTE  [name] Date of Birth:  June 05, 1980 Medical Record Number:  [id number]   Reason for Referral: stroke follow up    CHIEF COMPLAINT:  Chief Complaint  Patient presents with   Follow-up    RM 2 with spouse Shanda Bumps  Pt is well, still having the same symptoms of last visit. Dizziness and buzzing in ear.      HPI:   Today, 10/30/2020, Joel [name] returns for 6 month stroke follow up accompanied by his wife. Reports continued occasional dizziness and constant right ear tinnitus with hearing loss.  He has not yet obtained hearing aid as advised by Dr. Ezzard Standing due to financial reasons.  Episode in June/July while riding in the car with his family, hearing worsened and then had difficulty understanding what was being said ("sounded like robots") and was unable to get out any words. He also experienced increased pressure in his head.  This lasted approx 10 seconds and resolved after closing his eyes and taking deep breaths.  He has not had recurrence of these symptoms.  He has also been experiencing right-sided neck discomfort when looking towards the right.  Reports compliance on Plavix and Crestor and recently added Repatha. Blood pressure today 143/98. Monitors at home, recently increased amlodipine dose and plans to increase coreg from 12.5mg  BID to 25mg  BID. Wife reports patient "was off" one day approx 3-4 months ago and checked Bp which was within normal limits but BP machine said irregular heartbeat. This has not occurred again since that time.  Routinely follows with cardiology but forgot to mention during visit.   No further concerns at this time    History provided for reference purposes only Update 04/24/2020 JM: Joel [name] returns for 48-month stroke follow-up accompanied by his wife  Reports residual right ear tinnitus and hearing loss, occasional dizziness more  with increased exertion (such as going to the gym to work out), occasional imbalance and short-term memory loss Evaluated by Dr. 8-month 11/2019 with right ear moderate severe sensorineural hearing loss recommended follow-up for hearing aid but they are currently waiting to obtain hearing aid due to financial reasons  Short-term memory loss consistent since his stroke -denies long-term memory loss concerns.  Maintains ADLs and IADLs without difficulty Denies new stroke/TIA symptoms  Reports self discontinuing ALL prescribed medications a couple months ago as he wanted to see what medications he actually still needed to continue on or still needed - reports drastic dietary changes, routine exercise and activity and purposeful 60 lb weight loss since his stroke (03/2019)  He has since been recently restarted back on spironolactone and carvedilol -BP remains uncontrolled elevated today and typically 160/110-120s at home Cardiology also advised to initiate aspirin 81 mg daily for secondary stroke prevention (as self d/c'd plavix) Not currently on diabetic regimen as glucose levels recently in the 80s with recent A1c 6.1 Has f/u w/ endocrinology 3/31 with plans on repeat [name] work including lipid panel as he is currently not on statin therapy  Compliant on CPAP for OSA management routinely followed by pulmonology  No further concerns at this time  Update 10/26/2019 JM: Joel [name] returns for stroke follow-up Patient reports residual stroke deficits tinnitus and hearing deficit. He does report with improvement of hearing, the tinnitus worsens. Vertigo improving but can worsen with increased exertion. Quick episode 2-3 seconds and then resolves.  Denies residual  anxiety difficulty Denies new stroke/TIA symptoms  Remains on Plavix without bleeding or bruising Remains on Crestor 40 mg daily and Zetia without side effects Blood pressure today 134/88 Glucose levels not routinely monitored at home - typically  stable when he does check Reports ongoing use of CPAP for OSA management  During hospital admission, [name] work showed concern for antiphospholipid syndrome.  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  He is accompanied by his wife.  Residual deficits of right-sided hearing impairment with tinnitus and dizziness with some improvement.  Referral placed to neuro otologist after prior visit but they have not been called to schedule evaluation.  Previously working with outpatient therapies but currently placed on hold per patient request.  Prior visit concern for underlying anxiety contributing to dizziness sensation with panic attacks and initiated short course of Xanax.  GAD-7 score remains elevated but he has not had any additional panic attacks and has only used 1 dose of Xanax.  Continues to use meclizine daily with ongoing benefit.  He will experience short episode of vertigo typically daily but only lasts for a few minutes and then resolves.  Continues on DAPT despite 82-month recommendation but denies bleeding or bruising.  Recent follow-up with endocrinology showed continued elevated LDL at 156 therefore switch to atorvastatin to Crestor 40 mg daily as well as addition of Zetia.  Continues to follow with endocrinology for DM management as well with recent A1c 6.1.  Blood pressure today 146/106, asymptomatic.  Does not routinely monitor at home.  Endorses ongoing use of his CPAP for OSA management.  During stroke admission, hypercoagulable work-up consistent with presence of lupus anticoagulant meeting criteria for antiphospholipid syndrome.  No further concerns at this time.  Initial visit 04/21/2019: Joel [name] is a 40 year old male who is being seen today for stroke follow-up accompanied by his wife.   Residual stroke deficits mild right-sided weakness, dizziness, vertigo, nausea and right ear tinnitus with decreased hearing.  Currently working with physical therapy with improvement of his right side but endorses only minimal improvement of vestibular symptoms as well as symptoms waxing/waning.  Previously complaining of headaches which have since improved consisting of generalized pressure radiating down into his neck.  Underlying history of depression and does endorse worsening anxiety since his stroke.  Not currently on any medication therapy.  Continues on aspirin 325 mg daily and Plavix without bleeding or bruising.  Continues on atorvastatin without myalgias.  Blood pressure today 109/76 and does monitor at home typically 150/110-120.  Prior difficulty with blood pressure which has since improved.  Wife is not sure the accuracy of home cuff as they are using an older cuff.  Ongoing use of CPAP for OSA management.  No further concerns at this time.  Stroke admission 03/19/2019: Joel [name] is a 40 y.o. male with history of diabetes, CKD stage IIIb, hypertension, hyperlipidemia, EF 45-50%, CHF and hx of medical non compliance,  who presented on 03/19/2019 with dizziness, gait instability, loss of hearing R ear, facial numbness, nausea and vomiting. He did not receive IV t-PA due to late presentation (>4.5 hours from time of onset).  Evaluated by stroke team and Dr. 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  MRA head also showed severe stenosis versus artifact in left PCA distal P2/proximal P3 segment, intracranial arthrosclerosis involving anterior and posterior circulation including distal right vertebral artery, basilar artery, supraclinoid right ICA and bilateral MCA branches.  History of HTN and recommend long-term BP goal normotensive range.  LDL 211 and recommended continuation of atorvastatin 80  mg daily with medication compliance education provided.  DM type II controlled A1c 7.0.  History of cardiomyopathy/CHF with current admission EF 55 to 60% (02/2017 EF 25 to 30%) and recommended continued follow-up with cardiology.  Other stroke risk factors include prior EtOH use, morbid obesity, family history of stroke, OSA on CPAP and possible chronic stroke on imaging.  Recommended DAPT for 3 months then Plavix alone as previously on aspirin given intracranial stenosis.  Evaluated by therapies who recommended CIR but patient declined preferring to be discharged home.  Stroke: Small acute right middle cerebellar peduncle infarction due to large vessel disease with right AICA occlusion. Resultant right hemiataxia, mild right hemiparesis, right-sided hearing loss and right face numbness CT no acute abnormalities MRI head W&WO - Small acute infarction involving the right middle cerebellar peduncle. Corresponding enhancement may reflect early blood brain barrier disruption. Given presence of vascular risk factors, ischemia is favored over demyelination. There is also a probable small chronic infarct near the right frontal horn.  MRA head - Possible occlusion of the right AICA, or it could be diminutive or absent. Severe stenosis versus artifact in the left PCA distal P2/proximal P3 segment. Suggestion of intracranial atherosclerosis involving the anterior and posterior circulation, including mild irregularity of the: distal right vertebral artery, basilar artery, supraclinoid right ICA, and bilateral MCA branches.  TCD bubble study - no PFO 2D Echo - EF 55 - 60%. No cardiac source of emboli identified. Severely increased left ventricular hypertrophy.  Sars Corona Virus 2 - negative LDL - 211 HgbA1c - 7.0 Hypercoagulable labs positive lupus anticoagulant UDS - benzodiazepine VTE prophylaxis - Lovenox 40 aspirin 81 mg daily prior to admission, now on aspirin 325 mg daily and clopidogrel 75 mg daily.  Continue DAPT for 3 months and then plavix alone given intracranial stenosis Therapy recommendations:  CIR -> Rehab consult->Pt prefers to go home       ROS:   14 system review of systems performed and negative with exception of those listed in HPI  PMH:  Past Medical History:  Diagnosis Date   Anxiety    Chest pain    Chronic diastolic CHF (congestive heart failure) (HCC)    Constipation    Depression    GERD (gastroesophageal reflux disease)    Hyperlipidemia    Hypertension    Hypertensive heart disease    Lactose intolerance    Noncompliance with medications    Obesity    Palpitations    Renal disorder    Shortness of breath on exertion    Sleep apnea    Stroke (HCC)    Systolic dysfunction    a. TTE 06/2014: EF 45-50%, normal wall motion, mild MR, mild AI    PSH:  Past Surgical History:  Procedure Laterality Date   NO PAST SURGERIES      Social History:  Social History   Socioeconomic History   Marital status: Married    Spouse name: Not on file   Number of children: 1   Years of education: 12   Highest education level: 12th grade  Occupational History   Occupation: Disabled  Tobacco Use   Smoking status: Never  Smokeless tobacco: Never  Vaping Use   Vaping Use: Never used  Substance and Sexual Activity   Alcohol use: Not Currently    Comment: Very infrequently; ~2-3 drinks/year    Drug use: No   Sexual activity: Not Currently    Birth control/protection: None  Other Topics Concern   Not on file  Social History Narrative   Not on file   Social Determinants of Health   Financial Resource Strain: Low Risk    Difficulty of Paying Living Expenses: Not hard at all  Food Insecurity: Food Insecurity Present   Worried About Programme researcher, broadcasting/film/video in the Last Year: Sometimes true   Ran Out of Food in the Last Year: Sometimes true  Transportation Needs: No Transportation Needs   Lack of Transportation (Medical): No   Lack of Transportation  (Non-Medical): No  Physical Activity: Inactive   Days of Exercise per Week: 0 days   Minutes of Exercise per Session: 0 min  Stress: No Stress Concern Present   Feeling of Stress : Not at all  Social Connections: Not on file  Intimate Partner Violence: Not on file    Family History:  Family History  Problem Relation Age of Onset   Cancer Mother    Hypertension Mother    Alcoholism Father    Hyperlipidemia Father    Diabetes Father    CAD Father        a. stenting in his early 35s   Heart disease Father    Stroke Father    Heart attack Father    Diabetes Maternal Grandmother    Diabetes Maternal Grandfather    CAD Paternal Grandmother    CAD Paternal Grandfather    Colon cancer Paternal Grandfather    Diabetes Paternal Uncle     Medications:   Current Outpatient Medications on File Prior to Visit  Medication Sig Dispense Refill   amLODipine (NORVASC) 10 MG tablet Take 1 tablet (10 mg total) by mouth daily. 90 tablet 3   azelastine (ASTELIN) 0.1 % nasal spray Place 1 spray into both nostrils 2 (two) times daily. Use in each nostril as directed 30 mL 12   carvedilol (COREG) 25 MG tablet Take 1 tablet (25 mg total) by mouth 2 (two) times daily with a meal. 180 tablet 3   clopidogrel (PLAVIX) 75 MG tablet Take 1 tablet (75 mg total) by mouth daily. 90 tablet 3   Evolocumab (REPATHA) 140 MG/ML SOSY Inject 140 mg into the skin every 14 (fourteen) days. 2.1 mL 24   loratadine (CLARITIN) 10 MG tablet Take 10 mg by mouth daily.     montelukast (SINGULAIR) 10 MG tablet Take 1 tablet (10 mg total) by mouth at bedtime. 90 tablet 3   omeprazole (PRILOSEC) 20 MG capsule Take 20 mg by mouth daily.     rosuvastatin (CRESTOR) 40 MG tablet Take 1 tablet (40 mg total) by mouth daily. 90 tablet 3   sertraline (ZOLOFT) 100 MG tablet Take 200 mg by mouth daily.     No current facility-administered medications on file prior to visit.    Allergies:   Allergies  Allergen Reactions   Bidil  [Isosorb Dinitrate-Hydralazine] Other (See Comments)    Severe headache   Nitroglycerin Other (See Comments)    Causes severe headaches     Physical Exam  Vitals:   10/30/20 0836  BP: (!) 143/98  Pulse: 80  Weight: 247 lb (112 kg)  Height: 5\' 7"  (1.702 m)    Body  mass index is 38.69 kg/m. No results found.   General: well developed, well nourished, pleasant middle aged AA male, seated, in no evident distress Head: head normocephalic and atraumatic.   Neck: supple with no carotid or supraclavicular bruits Cardiovascular: regular rate and rhythm, no murmurs Musculoskeletal: full neck ROM Skin:  no rash/petichiae Vascular:  Normal pulses all extremities   Neurologic Exam Mental Status: Awake and fully alert. Normal speech and language. Oriented to place and time. Recent and remote memory intact. Attention span, concentration and fund of knowledge appropriate. Mood and affect appropriate Cranial Nerves: Pupils equal, briskly reactive to light. Extraocular movements full without nystagmus although subjectively discomfort (R neck) when looking towards left and up.  Visual fields full to confrontation.  Positive Rinne test (AC>BC bilaterally).  Weber's test heard in good ear.  Facial sensation intact. L nasolabial fold flattening.  Tongue, and palate moves normally and symmetrically.  Motor: Normal bulk and tone.  Normal strength in all tested extremities except right hand grip weakness Sensory.:  Decreased light touch sensation RUE compared to LUE.  Intact BLE Coordination: Rapid alternating movements normal in all extremities except decreased right hand.  Finger-to-nose LUE dysmetria and heel-to-shin mild incoordination of BLE. Gait and Station: Arises from chair without difficulty. Stance is normal. Gait demonstrates normal stride and mild imbalance greater when making turns.  Able to tandem walk without difficulty.  Reflexes: 1+ and symmetric. Toes downgoing.      ASSESSMENT:  Joel [name] is a 40 y.o. year old male presented with dizziness, gait instability, loss of hearing right ear, facial numbness and nausea/vomiting on 03/19/2019 with stroke work-up revealing small acute right middle cerebellar peduncle infarction secondary to large vessel disease with right AICA occlusion. Vascular risk factors include HTN, HLD, DM, CHF, OD ocular occlusion 2014, intracranial stenosis and OSA on CPAP.      PLAN:  Right cerebellar stroke:  Residual deficits: Occasional vertigo, imbalance and sensorineural hearing loss with persistent tinnitus Repeat hypercoagulable labs negative  Continue clopidogrel 75 mg daily and Repatha and Crestor for secondary stroke prevention Discussed secondary stroke prevention measures and importance of close PCP follow-up for aggressive stroke risk factor management as well as importance of medication compliance  Transient aphasia episode: Unclear etiology.  Possibly in setting of increased stress/anxiety vs TIA vs acute stroke.  Recommend obtaining MR brain and MRA head/neck to rule out stroke. Concern of irregular heart rhythm on BP cuff around that same time per wife. Will reach out to his established cardiologist for possible cardiac monitoring to rule out atrial fibrillation  New neurological deficits: As noted above on exam (left facial weakness, left dysmetria, right hand weakness and decreased RUE sensation). Obtain MR brain and MRA head/neck  HTN: BP goal<130/90.  Greatly improving on current regimen per cardiology  HLD: LDL goal <70.  LDL 150 (07/2020) - currently on Repatha and Crestor  DMII: A1c goal <7.0.  A1c 5.3 (04/2020) on nonpharmacological management monitored by endocrinology  OSA on CPAP: Ongoing compliance for secondary stroke prevention followed routinely by pulmonology Dr. Welton Flakes    Follow up in 4 months or call earlier if needed   CC:   [facility] provider: Dr. Geralyn Flash, Netta Neat, DO    I spent 48 minutes of  face-to-face and non-face-to-face time with patient and wife.  This included previsit chart review, [name] review, study review, order entry, electronic health record documentation, patient education and discussion regarding above topics, related education and discussion, further evaluation and treatment  options and answered all other questions to patient and wifes satisfaction    Ihor Austin, [facility]  [facility] [address] [place], [place] [address]  Phone [phone] Fax [phone] Note: This document was prepared with digital dictation and possible smart phrase technology. Any transcriptional errors that result from this process are unintentional.

## 2020-10-30 NOTE — Telephone Encounter (Signed)
Medicare/medicaid order sent to GI. [employer] they will reach out to the patient to schedule.  

## 2020-10-31 NOTE — Progress Notes (Signed)
I agree with the above plan 

## 2020-11-10 ENCOUNTER — Telehealth: Payer: Self-pay

## 2020-11-10 NOTE — Telephone Encounter (Signed)
Repatha Pa initiated through [email or web address] KEY: [id number] RESPONSE: This request has received a favorable outcome and is approved. Please note any additional information provided by [employer] at the bottom of your screen. You will also receive a faxed copy of the determination. If you have any questions please contact [employer] at [phone].  PA Case: [id number], Status: Approved, Coverage Starts on: 11/10/2020 12:00:00 AM, Coverage Ends on: 11/10/2021 12:00:00 AM.

## 2020-11-12 ENCOUNTER — Ambulatory Visit
Admission: RE | Admit: 2020-11-12 | Discharge: 2020-11-12 | Disposition: A | Discharge status: Home / Self Care | Payer: Medicare Other | Source: Ambulatory Visit | Attending: Adult Health | Admitting: Adult Health

## 2020-11-12 ENCOUNTER — Other Ambulatory Visit: Payer: Self-pay

## 2020-11-12 DIAGNOSIS — R29818 Other symptoms and signs involving the nervous system: Secondary | ICD-10-CM

## 2020-11-12 DIAGNOSIS — H9041 Sensorineural hearing loss, unilateral, right ear, with unrestricted hearing on the contralateral side: Secondary | ICD-10-CM

## 2020-11-12 DIAGNOSIS — I672 Cerebral atherosclerosis: Secondary | ICD-10-CM

## 2020-11-15 ENCOUNTER — Telehealth: Payer: Self-pay

## 2020-11-15 ENCOUNTER — Ambulatory Visit (INDEPENDENT_AMBULATORY_CARE_PROVIDER_SITE_OTHER): Payer: Medicare Other

## 2020-11-15 DIAGNOSIS — I6389 Other cerebral infarction: Secondary | ICD-10-CM

## 2020-11-15 DIAGNOSIS — I639 Cerebral infarction, unspecified: Secondary | ICD-10-CM

## 2020-11-15 DIAGNOSIS — I499 Cardiac arrhythmia, unspecified: Secondary | ICD-10-CM

## 2020-11-15 NOTE — Telephone Encounter (Signed)
Was able to reach out to Mr. Fetterman as requested by Dr. Mariah Milling. Dr. Mariah Milling spoke with pt's neurologist regarding pt's recent stroke. Dr. Mariah Milling advised  Can we send him a ZIO monitor to rule out stroke, atrial fibrillation  Thx  TG   Mr. Wrightsman agreed for monitor, okay with having it deliver to him. Educated pt on the ZIO monitor and how to apply. Advised if any question to call [employer] or the office.   Pt request to be mailed to a different address then what's on file due too "[place]iler park mailbox".  To be mailed to [address] [place] [address]  Otherwise all questions or concerns were address and no additional concerns at this time. Agreeable to plan, will call back for anything further.

## 2020-11-27 ENCOUNTER — Other Ambulatory Visit: Payer: Self-pay

## 2020-11-27 ENCOUNTER — Encounter (INDEPENDENT_AMBULATORY_CARE_PROVIDER_SITE_OTHER): Payer: Self-pay

## 2020-11-27 ENCOUNTER — Ambulatory Visit (INDEPENDENT_AMBULATORY_CARE_PROVIDER_SITE_OTHER): Payer: Medicare Other | Admitting: Otolaryngology

## 2020-11-27 DIAGNOSIS — H9041 Sensorineural hearing loss, unilateral, right ear, with unrestricted hearing on the contralateral side: Secondary | ICD-10-CM

## 2020-11-27 DIAGNOSIS — I639 Cerebral infarction, unspecified: Secondary | ICD-10-CM | POA: Diagnosis not present

## 2020-11-27 NOTE — Progress Notes (Signed)
HPI: Joel [name] is a 40 y.o. male who returns today for evaluation of right ear hearing loss status post a stroke 2 years ago.  He had a hearing test performed a year ago that showed normal hearing in the left ear and moderate to severe sensorineural hearing loss in the right ear.  He presents today for recheck and recommendations concerning hearing loss in the right ear..  Past Medical History:  Diagnosis Date   Anxiety    Chest pain    Chronic diastolic CHF (congestive heart failure) (HCC)    Constipation    Depression    GERD (gastroesophageal reflux disease)    Hyperlipidemia    Hypertension    Hypertensive heart disease    Lactose intolerance    Noncompliance with medications    Obesity    Palpitations    Renal disorder    Shortness of breath on exertion    Sleep apnea    Stroke (HCC)    Systolic dysfunction    a. TTE 06/2014: EF 45-50%, normal wall motion, mild MR, mild AI   Past Surgical History:  Procedure Laterality Date   NO PAST SURGERIES     Social History   Socioeconomic History   Marital status: Married    Spouse name: Not on file   Number of children: 1   Years of education: 12   Highest education level: 12th grade  Occupational History   Occupation: Disabled  Tobacco Use   Smoking status: Never   Smokeless tobacco: Never  Vaping Use   Vaping Use: Never used  Substance and Sexual Activity   Alcohol use: Not Currently    Comment: Very infrequently; ~2-3 drinks/year    Drug use: No   Sexual activity: Not Currently    Birth control/protection: None  Other Topics Concern   Not on file  Social History Narrative   Not on file   Social Determinants of Health   Financial Resource Strain: Low Risk    Difficulty of Paying Living Expenses: Not hard at all  Food Insecurity: Food Insecurity Present   Worried About Programme researcher, broadcasting/film/video in the Last Year: Sometimes true   Ran Out of Food in the Last Year: Sometimes true  Transportation Needs: No  Transportation Needs   Lack of Transportation (Medical): No   Lack of Transportation (Non-Medical): No  Physical Activity: Inactive   Days of Exercise per Week: 0 days   Minutes of Exercise per Session: 0 min  Stress: No Stress Concern Present   Feeling of Stress : Not at all  Social Connections: Not on file   Family History  Problem Relation Age of Onset   Cancer Mother    Hypertension Mother    Alcoholism Father    Hyperlipidemia Father    Diabetes Father    CAD Father        a. stenting in his early 72s   Heart disease Father    Stroke Father    Heart attack Father    Diabetes Maternal Grandmother    Diabetes Maternal Grandfather    CAD Paternal Grandmother    CAD Paternal Grandfather    Colon cancer Paternal Grandfather    Diabetes Paternal Uncle    Allergies  Allergen Reactions   Bidil [Isosorb Dinitrate-Hydralazine] Other (See Comments)    Severe headache   Nitroglycerin Other (See Comments)    Causes severe headaches   Prior to Admission medications   Medication Sig Start Date End Date Taking? Authorizing  Provider  amLODipine (NORVASC) 10 MG tablet Take 1 tablet (10 mg total) by mouth daily. 10/10/20   Antonieta Iba, MD  azelastine (ASTELIN) 0.1 % nasal spray Place 1 spray into both nostrils 2 (two) times daily. Use in each nostril as directed 10/11/20   Althea Charon, Netta Neat, DO  carvedilol (COREG) 25 MG tablet Take 1 tablet (25 mg total) by mouth 2 (two) times daily with a meal. 10/10/20   Gollan, Tollie Pizza, MD  clopidogrel (PLAVIX) 75 MG tablet Take 1 tablet (75 mg total) by mouth daily. 10/10/20   Antonieta Iba, MD  Evolocumab (REPATHA) 140 MG/ML SOSY Inject 140 mg into the skin every 14 (fourteen) days. 08/17/20   Antonieta Iba, MD  loratadine (CLARITIN) 10 MG tablet Take 10 mg by mouth daily.    [provider]  montelukast (SINGULAIR) 10 MG tablet Take 1 tablet (10 mg total) by mouth at bedtime. 10/11/20   Karamalegos, Netta Neat, DO   omeprazole (PRILOSEC) 20 MG capsule Take 20 mg by mouth daily.    [provider]  rosuvastatin (CRESTOR) 40 MG tablet Take 1 tablet (40 mg total) by mouth daily. 10/10/20   Antonieta Iba, MD  sertraline (ZOLOFT) 100 MG tablet Take 200 mg by mouth daily.    [provider]     Positive ROS: Otherwise negative  All other systems have been reviewed and were otherwise negative with the exception of those mentioned in the HPI and as above.  Physical Exam: Constitutional: Alert, well-appearing, no acute distress Ears: External ears without lesions or tenderness. Ear canals with minimal wax buildup that is nonobstructing.  TMs are clear bilaterally. Nasal: External nose without lesions.. Clear nasal passages Oral: Lips and gums without lesions. Tongue and palate mucosa without lesions. Posterior oropharynx clear. Neck: No palpable adenopathy or masses Respiratory: Breathing comfortably  Skin: No facial/neck lesions or rash noted.  Audiogram today demonstrated moderate right ear sensorineural hearing loss with SRT of 45 dB.  This has not changed since previous audiogram in June 2021.  Procedures  Assessment: Right ear sensorineural hearing loss following stroke 2 years ago.  Plan: His hearing in the right ear is not changed over the past year. Reviewed with the patient concerning his hearing loss and the only treatment option for this would be use of a hearing aid.   Narda Bonds, MD

## 2020-11-29 ENCOUNTER — Encounter: Payer: Self-pay | Admitting: Adult Health

## 2020-11-29 ENCOUNTER — Ambulatory Visit (INDEPENDENT_AMBULATORY_CARE_PROVIDER_SITE_OTHER): Payer: Medicare Other

## 2020-11-29 ENCOUNTER — Other Ambulatory Visit: Payer: Self-pay

## 2020-11-29 DIAGNOSIS — G4733 Obstructive sleep apnea (adult) (pediatric): Secondary | ICD-10-CM | POA: Diagnosis not present

## 2020-11-29 NOTE — Progress Notes (Signed)
95 percentile pressure 11.8   95th percentile leak 22.2   apnea index 1.0 /hr  apnea-hypopnea index  1.5 /hr   total days used  >4 hr 88 days  total days used <4 hr 2 days  Total compliance 98 percent  He is doing great states can't sleep without it, no problems or questions at this time Pt was seen by Tresa Endo from [facility]

## 2020-11-30 ENCOUNTER — Encounter: Payer: Self-pay | Admitting: Family Medicine

## 2020-11-30 NOTE — Telephone Encounter (Signed)
You know anything about this monitor ?

## 2020-12-06 ENCOUNTER — Ambulatory Visit: Payer: Medicare Other

## 2020-12-06 ENCOUNTER — Encounter: Payer: Self-pay | Admitting: Adult Health

## 2020-12-08 DIAGNOSIS — I499 Cardiac arrhythmia, unspecified: Secondary | ICD-10-CM

## 2020-12-08 DIAGNOSIS — I6389 Other cerebral infarction: Secondary | ICD-10-CM | POA: Diagnosis not present

## 2020-12-08 DIAGNOSIS — I639 Cerebral infarction, unspecified: Secondary | ICD-10-CM | POA: Diagnosis not present

## 2020-12-12 ENCOUNTER — Other Ambulatory Visit: Payer: Self-pay

## 2020-12-12 ENCOUNTER — Ambulatory Visit (INDEPENDENT_AMBULATORY_CARE_PROVIDER_SITE_OTHER): Payer: Medicare Other | Admitting: Nurse Practitioner

## 2020-12-12 ENCOUNTER — Encounter: Payer: Self-pay | Admitting: Nurse Practitioner

## 2020-12-12 VITALS — BP 130/80 | HR 90 | Temp 98.5°F | Resp 16 | Ht 67.0 in | Wt 264.6 lb

## 2020-12-12 DIAGNOSIS — Z7189 Other specified counseling: Secondary | ICD-10-CM

## 2020-12-12 DIAGNOSIS — G4733 Obstructive sleep apnea (adult) (pediatric): Secondary | ICD-10-CM | POA: Diagnosis not present

## 2020-12-12 DIAGNOSIS — I639 Cerebral infarction, unspecified: Secondary | ICD-10-CM

## 2020-12-12 DIAGNOSIS — Z9989 Dependence on other enabling machines and devices: Secondary | ICD-10-CM

## 2020-12-12 NOTE — Progress Notes (Signed)
[facility] [place], [place] [address]  Internal MEDICINE  Office Visit Note  Patient Name: Joel [name]  [id number]  [id number]  Date of Service: 12/12/2020  Chief Complaint  Patient presents with   Follow-up    Discuss breathing   Sleep Apnea    HPI Bravlio presents for a follow up visit for OSA with CPAP use. CPAP download can be seen below:  CPAP download: 95 percentile pressure: 11.8 95th percentile leak: 22.2 apnea index: 1.0 /hr apnea-hypopnea index:  1.5 /hr total days used  >4 hr: 88 days total days used <4 hr: 2 days Total compliance: 98 percent   He is doing great states can't sleep without it, no problems or questions at this time Pt was seen by Claiborne Billings from [facility] recently. He does not need any supplies at this time.     Current Medication: Outpatient Encounter Medications as of 12/12/2020  Medication Sig   amLODipine (NORVASC) 10 MG tablet Take 1 tablet (10 mg total) by mouth daily.   azelastine (ASTELIN) 0.1 % nasal spray Place 1 spray into both nostrils 2 (two) times daily. Use in each nostril as directed   carvedilol (COREG) 25 MG tablet Take 1 tablet (25 mg total) by mouth 2 (two) times daily with a meal.   clopidogrel (PLAVIX) 75 MG tablet Take 1 tablet (75 mg total) by mouth daily.   Evolocumab (REPATHA) 140 MG/ML SOSY Inject 140 mg into the skin every 14 (fourteen) days.   loratadine (CLARITIN) 10 MG tablet Take 10 mg by mouth daily.   montelukast (SINGULAIR) 10 MG tablet Take 1 tablet (10 mg total) by mouth at bedtime.   omeprazole (PRILOSEC) 20 MG capsule Take 20 mg by mouth daily.   rosuvastatin (CRESTOR) 40 MG tablet Take 1 tablet (40 mg total) by mouth daily.   sertraline (ZOLOFT) 100 MG tablet Take 200 mg by mouth daily.   No facility-administered encounter medications on file as of 12/12/2020.    Surgical History: Past Surgical History:  Procedure Laterality Date   NO PAST SURGERIES      Medical History: Past  Medical History:  Diagnosis Date   Anxiety    Chest pain    Chronic diastolic CHF (congestive heart failure) (HCC)    Constipation    Depression    GERD (gastroesophageal reflux disease)    Hyperlipidemia    Hypertension    Hypertensive heart disease    Lactose intolerance    Noncompliance with medications    Obesity    Palpitations    Renal disorder    Shortness of breath on exertion    Sleep apnea    Stroke (HCC)    Systolic dysfunction    a. TTE 06/2014: EF 45-50%, normal wall motion, mild MR, mild AI    Family History: Family History  Problem Relation Age of Onset   Cancer Mother    Hypertension Mother    Alcoholism Father    Hyperlipidemia Father    Diabetes Father    CAD Father        a. stenting in his early 59s   Heart disease Father    Stroke Father    Heart attack Father    Diabetes Maternal Grandmother    Diabetes Maternal Grandfather    CAD Paternal Grandmother    CAD Paternal Grandfather    Colon cancer Paternal Grandfather    Diabetes Paternal Uncle     Social History   Socioeconomic History  Marital status: Married    Spouse name: Not on file   Number of children: 1   Years of education: 39   Highest education level: 12th grade  Occupational History   Occupation: Disabled  Tobacco Use   Smoking status: Never   Smokeless tobacco: Never  Vaping Use   Vaping Use: Never used  Substance and Sexual Activity   Alcohol use: Not Currently    Comment: Very infrequently; ~2-3 drinks/year    Drug use: No   Sexual activity: Not Currently    Birth control/protection: None  Other Topics Concern   Not on file  Social History Narrative   Not on file   Social Determinants of Health   Financial Resource Strain: Low Risk    Difficulty of Paying Living Expenses: Not hard at all  Food Insecurity: Food Insecurity Present   Worried About Charity fundraiser in the Last Year: Sometimes true   Arboriculturist in the Last Year: Sometimes true   Transportation Needs: No Transportation Needs   Lack of Transportation (Medical): No   Lack of Transportation (Non-Medical): No  Physical Activity: Inactive   Days of Exercise per Week: 0 days   Minutes of Exercise per Session: 0 min  Stress: No Stress Concern Present   Feeling of Stress : Not at all  Social Connections: Not on file  Intimate Partner Violence: Not on file      Review of Systems  Constitutional:  Negative for chills, fatigue and unexpected weight change.  HENT:  Negative for congestion, rhinorrhea, sneezing and sore throat.   Eyes:  Negative for redness.  Respiratory:  Negative for cough, chest tightness and shortness of breath.   Cardiovascular:  Negative for chest pain and palpitations.  Gastrointestinal:  Negative for abdominal pain, constipation, diarrhea, nausea and vomiting.  Genitourinary:  Negative for dysuria and frequency.  Musculoskeletal:  Negative for arthralgias, back pain, joint swelling and neck pain.  Skin:  Negative for rash.  Neurological: Negative.  Negative for tremors and numbness.  Hematological:  Negative for adenopathy. Does not bruise/bleed easily.  Psychiatric/Behavioral:  Negative for behavioral problems (Depression), sleep disturbance and suicidal ideas. The patient is not nervous/anxious.    Vital Signs: BP 130/80 Comment: 143/98  Pulse 90   Temp 98.5 F (36.9 C)   Resp 16   Ht 5\' 7"  (1.702 m)   Wt 264 lb 9.6 oz (120 kg)   SpO2 98%   BMI 41.44 kg/m    Physical Exam Vitals reviewed.  Constitutional:      General: He is not in acute distress.    Appearance: Normal appearance. He is obese. He is not ill-appearing.  HENT:     Head: Normocephalic and atraumatic.  Eyes:     Pupils: Pupils are equal, round, and reactive to light.  Cardiovascular:     Rate and Rhythm: Normal rate and regular rhythm.  Pulmonary:     Effort: Pulmonary effort is normal. No respiratory distress.  Neurological:     Mental Status: He is alert  and oriented to person, place, and time.     Cranial Nerves: No cranial nerve deficit.     Coordination: Coordination normal.     Gait: Gait normal.  Psychiatric:        Mood and Affect: Mood normal.        Behavior: Behavior normal.       Assessment/Plan: 1. OSA on CPAP Doing well, 98% compliance. Can't sleep without it, wakes up  well rested.   2. CPAP use counseling Does not need any supplies. Encouraged to keep up with routine maintenance and cleaning of the CPAP machine, patient does not have any questions regarding maintenance or cleaning of the machine.    General Counseling: nilo fallin understanding of the findings of todays visit and agrees with plan of treatment. I have discussed any further diagnostic evaluation that may be needed or ordered today. We also reviewed his medications today. he has been encouraged to call the office with any questions or concerns that should arise related to todays visit.    No orders of the defined types were placed in this encounter.   No orders of the defined types were placed in this encounter.   Return in about 6 months (around 06/11/2021) for F/U, pulmonary/sleep with Jewel Mcafee or DSK.   Total time spent:20 Minutes Time spent includes review of chart, medications, test results, and follow up plan with the patient.   [place] Controlled Substance Database was reviewed by me.  This patient was seen by Sallyanne Kuster, FNP-C in collaboration with Dr. Beverely Risen as a part of collaborative care agreement.   Darling Cieslewicz R. Tedd Sias, MSN, FNP-C Internal medicine

## 2020-12-13 ENCOUNTER — Encounter: Payer: Self-pay | Admitting: Family Medicine

## 2020-12-13 DIAGNOSIS — T485X5A Adverse effect of other anti-common-cold drugs, initial encounter: Secondary | ICD-10-CM

## 2020-12-13 DIAGNOSIS — J351 Hypertrophy of tonsils: Secondary | ICD-10-CM

## 2020-12-13 DIAGNOSIS — J3089 Other allergic rhinitis: Secondary | ICD-10-CM

## 2020-12-13 DIAGNOSIS — J310 Chronic rhinitis: Secondary | ICD-10-CM

## 2021-01-04 ENCOUNTER — Encounter: Payer: Self-pay | Admitting: Nurse Practitioner

## 2021-01-04 ENCOUNTER — Encounter: Payer: Self-pay | Admitting: Cardiovascular Disease

## 2021-01-08 ENCOUNTER — Telehealth: Payer: Self-pay

## 2021-01-08 NOTE — Telephone Encounter (Signed)
Was able to reach out to pt via phone and make contact to review their recent ZIO monitor results. Dr. Mariah Milling advised based on the current results   Event monitor  Normal sinus rhythm  Patient had a min HR of 70 bpm, max HR of 150 bpm, and avg HR of 87 bpm.     Isolated SVEs were rare (<1.0%), SVE Couplets were rare (<1.0%), and no SVE Triplets were present.  Isolated VEs were rare (<1.0%), and no VE Couplets or VE Triplets were present.     Patient triggered events (5) not associated with significant arrhythmia   Pt verbalized understanding, is thankful for the results call, all questions and concerns were address. Will call back for anything further, f/u as schedule on 12/5 at 08:00 am with Dr. Mariah Milling to review results further in detail and discuss ongoing sinus issues for past 2 months, similar to first CHF event (refer to MyChart message).

## 2021-01-14 NOTE — Progress Notes (Signed)
Date:  01/15/2021   ID:  Joel [name], DOB [birth year], MRN [id number]  Patient Location:  [address] [place] [address]   Provider location:   [address] [facility], [place] office  PCP:  [name], DO  Cardiologist:  [facility]  Chief Complaint  Patient presents with   Follow up Zio Monitor.     Patient c/o mild shortness of breath at times. Medications reviewed by the patient verbally.     History of Present Illness:    Joel [name] is a 40 y.o. 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  DM, followed by endocrine HLD,  medication noncompliance,  obesity,  sleep apena on CPAP Hospital admission February 13, 2017 acute on chronic combined CHF hypertensive emergency. Last echocardiogram 2021 ejection fraction 55% He presents for follow-up of his systolic and diastolic CHF  Last seen in clinic aug 2022 Episodes of sob when sitting, Zio reviewed: no arrhythmia  Wife does his medications, she is not here today Recent change to Lexapro  Weight up, 280 pounds, "Depression" eating more Lots of breads, denies soda or sweet tea No regular exercise  Prior weight loss with massive fluid restriction, has gained all the weight back  Repatha and Crestor on his list, reports he is taking it but wife who does the medications is not here  Blood work reviewed A1C 7.7 up from 5.3 Total chol 221 CR 1.59, down from 2  EKG personally reviewed by myself on todays visit Normal sinus rhythm left axis deviation rate 81 bpm  Other past medical history reviewed Acute CVA 02/2019 facial numbness and dizziness and right-sided weakness  2D echo showed normal ejection fraction  without cardiac source of embolism.  Seen by neurology Recommend dual antiplatelet therapy with aspirin and Plavix for 3 weeks followed by Plavix alone  MR ANGIO HEAD WO CONTRAST 03/20/2019 1. No large vessel occlusion. Possible occlusion of the right AICA, or it could be diminutive or absent.  2. Severe stenosis versus artifact in the left PCA distal P2/proximal P3 segment.  3. Suggestion of intracranial atherosclerosis involving the anterior and posterior circulation, including mild irregularity of the: distal right vertebral artery, basilar artery, supraclinoid right ICA, and bilateral MCA branches.  4. Non dominant left vertebral artery functionally terminates in PICA. Fetal type bilateral PCA origins.    MR ANGIO NECK   03/20/2019 Negative neck MRA. Dominant right vertebral artery.    MR BRAIN 03/19/2019 Small acute infarction involving the right middle cerebellar peduncle. Corresponding enhancement may reflect early blood brain barrier disruption. Given presence of vascular risk factors, ischemia is favored over demyelination. There is also a probable small chronic infarct near the right frontal horn.   Presented to the hospital 02/12/17/18 due to HF and HTN.  Hypertensive urgency on arrival Echo report from 02/12/17 EF of 25-30%  Aggressive diuresis, blood pressure control  Echo 02/2017 Left ventricle: The cavity size was normal. There was moderate   concentric hypertrophy. Systolic function was severely reduced.   The estimated ejection fraction was in the range of 25% to 30%.   Diffuse hypokinesis. Regional wall motion abnormalities cannot be   excluded. Features are consistent with a pseudonormal left   ventricular filling pattern, with concomitant abnormal  relaxation   and increased filling pressure (grade 2 diastolic dysfunction).   Past Medical History:  Diagnosis Date   Anxiety    Chest pain    Chronic diastolic CHF (congestive heart failure) (HCC)    Constipation     Depression    GERD (gastroesophageal reflux disease)    Hyperlipidemia    Hypertension    Hypertensive heart disease    Lactose intolerance    Noncompliance with medications    Obesity    Palpitations    Renal disorder    Shortness of breath on exertion    Sleep apnea    Stroke [facility])    Systolic dysfunction    a. TTE 06/2014: EF 45-50%, normal wall motion, mild MR, mild AI   Past Surgical History:  Procedure Laterality Date   NO PAST SURGERIES       Current Meds  Medication Sig   amLODipine (NORVASC) 10 MG tablet Take 1 tablet (10 mg total) by mouth daily.   azelastine (ASTELIN) 0.1 % nasal spray Place 1 spray into both nostrils 2 (two) times daily. Use in each nostril as directed   carvedilol (COREG) 25 MG tablet Take 1 tablet (25 mg total) by mouth 2 (two) times daily with a meal.   clopidogrel (PLAVIX) 75 MG tablet Take 1 tablet (75 mg total) by mouth daily.   escitalopram (LEXAPRO) 10 MG tablet Take 15 mg by mouth daily.   Evolocumab (REPATHA) 140 MG/ML SOSY Inject 140 mg into the skin every 14 (fourteen) days.   loratadine (CLARITIN) 10 MG tablet Take 10 mg by mouth daily.   montelukast (SINGULAIR) 10 MG tablet Take 1 tablet (10 mg total) by mouth at bedtime.   omeprazole (PRILOSEC) 20 MG capsule Take 20 mg by mouth daily.   OZEMPIC, 0.25 OR 0.5 MG/DOSE, 2 MG/1.5ML SOPN Inject 0.5 mg into the skin once a week.   rosuvastatin (CRESTOR) 40 MG tablet Take 1 tablet (40 mg total) by mouth daily.     Allergies:   Bidil [isosorb dinitrate-hydralazine] and Nitroglycerin , H/A  Social History   Tobacco Use   Smoking status: Never   Smokeless tobacco: Never  Vaping Use   Vaping Use: Never used  Substance Use Topics   Alcohol use: Not Currently    Comment: Very infrequently; ~2-3 drinks/year    Drug use: No    Family Hx: The patient's family history includes Alcoholism in his father; CAD in his father, paternal grandfather, and paternal grandmother; Cancer in his mother;  Colon cancer in his paternal grandfather; Diabetes in his father, maternal grandfather, maternal grandmother, and paternal uncle; Heart attack in his father; Heart disease in his father; Hyperlipidemia in his father; Hypertension in his mother; Stroke in his father.  ROS:   Please see the history of present illness.    Review of Systems  Constitutional:        Weight gain  HENT: Negative.    Respiratory: Negative.    Cardiovascular: Negative.   Gastrointestinal: Negative.   Musculoskeletal: Negative.   Neurological: Negative.   Psychiatric/Behavioral: Negative.    All other systems reviewed and are negative.   Labs/Other Tests and Data Reviewed:    Recent Labs: 07/25/2020: ALT 16; BUN 22; Creatinine, Ser 1.59; Hemoglobin 14.6; Platelets 224; Potassium 4.0; Sodium 141; TSH 1.980   Recent Lipid Panel Lab Results  Component Value Date/Time   CHOL 221 (H) 07/25/2020 04:28 PM   CHOL 361 (H) 06/11/2014 12:32 PM   TRIG 69 07/25/2020  04:28 PM   TRIG 246 (H) 06/11/2014 12:32 PM   HDL 59 07/25/2020 04:28 PM   HDL 41 06/11/2014 12:32 PM   CHOLHDL 12.6 03/20/2019 01:13 AM   LDLCALC 150 (H) 07/25/2020 04:28 PM   LDLCALC 271 (H) 06/11/2014 12:32 PM    Wt Readings from Last 3 Encounters:  01/15/21 282 lb 6 oz (128.1 kg)  12/12/20 264 lb 9.6 oz (120 kg)  10/30/20 247 lb (112 kg)     Exam:    Vital Signs: Vital signs may also be detailed in the HPI BP 118/82 (BP Location: Left Arm, Patient Position: Sitting, Cuff Size: Large)   Pulse 81   Ht 5\' 7"  (1.702 m)   Wt 282 lb 6 oz (128.1 kg)   SpO2 99%   BMI 44.23 kg/m   Constitutional:  oriented to person, place, and time. No distress.  HENT:  Head: Grossly normal Eyes:  no discharge. No scleral icterus.  Neck: No JVD, no carotid bruits  Cardiovascular: Regular rate and rhythm, no murmurs appreciated Pulmonary/Chest: Clear to auscultation bilaterally, no wheezes or rails Abdominal: Soft.  no distension.  no tenderness.   Musculoskeletal: Normal range of motion Neurological:  normal muscle tone. Coordination normal. No atrophy Skin: Skin warm and dry Psychiatric: normal affect, pleasant  ASSESSMENT & PLAN:    Dilated cardiomyopathy ([place]) EF has fluctuated 45 to 50% then dropped 25 to 30% in 2019 Ejection fraction back up to normal January 2021 holding off on Entresto ACE ARB given renal failure No changes to his medications today, blood pressure stable  systolic CHF, chronic (HCC) -  Appears euvolemic but having rare episodes of shortness of breath typically at rest, orthopnea Normal ejection fraction January 2021 Recommended he take Lasix as needed, very sparingly given his renal dysfunction Uncertain if symptoms secondary to weight gain ZIO monitor reviewed with him, no arrhythmia responsible for his symptoms Triggered events with normal sinus rhythm  Essential hypertension intolerance to BiDil, isosorbide Avoiding ACE, ARB, Entresto in the setting of renal failure Recommend he continue amlodipine, carvedilol  What dose his medications  History of stroke Residual right-sided deficits  cerebrovascular disease  on MRI/MRA  Plavix continued  on Repatha, Crestor Elevated cholesterol likely from medication Mrs.  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

## 2021-01-15 ENCOUNTER — Other Ambulatory Visit: Payer: Self-pay

## 2021-01-15 ENCOUNTER — Encounter: Payer: Self-pay | Admitting: Cardiovascular Disease

## 2021-01-15 ENCOUNTER — Ambulatory Visit (INDEPENDENT_AMBULATORY_CARE_PROVIDER_SITE_OTHER): Payer: Medicare Other | Admitting: Cardiovascular Disease

## 2021-01-15 VITALS — BP 118/82 | HR 81 | Ht 67.0 in | Wt 282.4 lb

## 2021-01-15 DIAGNOSIS — I5022 Chronic systolic (congestive) heart failure: Secondary | ICD-10-CM

## 2021-01-15 DIAGNOSIS — N1831 Chronic kidney disease, stage 3a: Secondary | ICD-10-CM

## 2021-01-15 DIAGNOSIS — I639 Cerebral infarction, unspecified: Secondary | ICD-10-CM | POA: Diagnosis not present

## 2021-01-15 DIAGNOSIS — I110 Hypertensive heart disease with heart failure: Secondary | ICD-10-CM

## 2021-01-15 DIAGNOSIS — N1832 Chronic kidney disease, stage 3b: Secondary | ICD-10-CM

## 2021-01-15 DIAGNOSIS — I420 Dilated cardiomyopathy: Secondary | ICD-10-CM | POA: Diagnosis not present

## 2021-01-15 DIAGNOSIS — I10 Essential (primary) hypertension: Secondary | ICD-10-CM | POA: Diagnosis not present

## 2021-01-15 DIAGNOSIS — E1022 Type 1 diabetes mellitus with diabetic chronic kidney disease: Secondary | ICD-10-CM

## 2021-01-15 MED ORDER — FUROSEMIDE 20 MG PO TABS: 20.0000 mg | ORAL_TABLET | Freq: Every day | ORAL | 3 refills | Status: DC | PRN

## 2021-01-15 NOTE — Patient Instructions (Addendum)
Medication Instructions:  Please START lasix/furosemide 20 mg daily  as needed for shortness of breath episodes  If you need a refill on your cardiac medications before your next appointment, please call your pharmacy.   Lab work: No new labs needed  Testing/Procedures: No new testing needed  Follow-Up: At [facility], you and your health needs are our priority.  As part of our continuing mission to provide you with exceptional heart care, we have created designated Provider Care Teams.  These Care Teams include your primary Cardiologist (physician) and Advanced Practice Providers (APPs -  Physician Assistants and Nurse Practitioners) who all work together to provide you with the care you need, when you need it.  You will need a follow up appointment in 6 months  Providers on your designated Care Team:   Nicolasa Ducking, NP Eula Listen, PA-C Cadence Fransico Michael, [place]  COVID-19 Vaccine Information can be found at: [email or web address] For questions related to vaccine distribution or appointments, please email [email or web address] or call [phone].

## 2021-02-02 ENCOUNTER — Encounter: Payer: Self-pay | Admitting: Internal Medicine

## 2021-02-16 ENCOUNTER — Encounter: Payer: Self-pay | Admitting: Cardiovascular Disease

## 2021-02-19 ENCOUNTER — Telehealth: Payer: Self-pay | Admitting: Cardiovascular Disease

## 2021-02-19 NOTE — Telephone Encounter (Signed)
° °  Primary Cardiologist: Julien Nordmann, MD  Chart reviewed as part of pre-operative protocol coverage. Given past medical history and time since last visit, based on ACC/AHA guidelines, Joel [name] would be at acceptable risk for the planned procedure without further cardiovascular testing.   Patient has history of acute CVA.  Recommendations for holding Plavix will need to come from PCP/neurology.  I will route this recommendation to the requesting party via Epic fax function and remove from pre-op pool.  Please call with questions.  Thomasene Ripple. Jyra Lagares NP-C    02/19/2021, 4:25 PM [facility] [address] Office [phone] Fax [phone]

## 2021-02-19 NOTE — Telephone Encounter (Signed)
° ° °  Pre-operative Risk Assessment    Patient Name: Joel [name]  DOB: [birth year] MRN: [id number]      Request for Surgical Clearance    Procedure:  Sinus Surgery  Date of Surgery:  Clearance TBD                                 Surgeon:  Dr Karrie Doffing Group or Practice Name:  [facility] Phone number:  [phone] Fax number:  [phone]   Type of Clearance Requested:   - Pharmacy:  Hold Clopidogrel (Plavix) how long?   Type of Anesthesia:  Not Indicated   Additional requests/questions:    Courtney Heys   02/19/2021, 4:20 PM

## 2021-02-22 ENCOUNTER — Encounter: Payer: Self-pay | Admitting: Family Medicine

## 2021-03-01 ENCOUNTER — Ambulatory Visit (INDEPENDENT_AMBULATORY_CARE_PROVIDER_SITE_OTHER): Payer: Medicare Other | Admitting: Adult Health

## 2021-03-01 ENCOUNTER — Encounter: Payer: Self-pay | Admitting: Adult Health

## 2021-03-01 VITALS — BP 139/95 | HR 86 | Ht 67.0 in | Wt 283.0 lb

## 2021-03-01 DIAGNOSIS — E785 Hyperlipidemia, unspecified: Secondary | ICD-10-CM | POA: Diagnosis not present

## 2021-03-01 DIAGNOSIS — I639 Cerebral infarction, unspecified: Secondary | ICD-10-CM

## 2021-03-01 NOTE — Patient Instructions (Signed)
Continue clopidogrel 75 mg daily  and Repatha and Crestor for secondary stroke prevention  Continue to follow with pulm for sleep apnea on CPAP  We will check cholesterol levels today - you will follow up with endocrinology next month to repeat A1c   Continue to follow up with PCP regarding cholesterol, blood pressure and diabetes management  Maintain strict control of hypertension with blood pressure goal below 130/90, diabetes with hemoglobin A1c goal below 7.0 % and cholesterol with LDL cholesterol (bad cholesterol) goal below 70 mg/dL.   Signs of a Stroke? Follow the BEFAST method:  Balance Watch for a sudden loss of balance, trouble with coordination or vertigo Eyes Is there a sudden loss of vision in one or both eyes? Or double vision?  Face: Ask the person to smile. Does one side of the face droop or is it numb?  Arms: Ask the person to raise both arms. Does one arm drift downward? Is there weakness or numbness of a leg? Speech: Ask the person to repeat a simple phrase. Does the speech sound slurred/strange? Is the person confused ? Time: If you observe any of these signs, call 911.     Followup in the future with me in 6 months or call earlier if needed       Thank you for coming to see [place] at [facility]. I hope we have been able to provide you high quality care today.  You may receive a patient satisfaction survey over the next few weeks. We would appreciate your feedback and comments so that we may continue to improve ourselves and the health of our patients.

## 2021-03-01 NOTE — Progress Notes (Signed)
[facility] [address] [place]. [place] [address] [phone]       STROKE FOLLOW UP NOTE  [name] Date of Birth:  July 19, 1980 Medical Record Number:  [id number]   Reason for Referral: stroke follow up    CHIEF COMPLAINT:  Chief Complaint  Patient presents with   Follow-up    RM 2 with family friend Joel [name]  Pt is well and stable, no new concerns      HPI:   Update 03/01/2021 JM: Returns for 32-month stroke follow-up accompanied by family friend, Joel [name].  Overall stable. Residual right ear hearing loss with tinnitus and occasional dizziness stable. Re-eval by Dr. Lucia Gaskins 11/2020 unchanged right ear sensorineural hearing loss and continued recommendation of hearing aid.  Prior transient episode of aphasia and right sided deficits not seen on exam prior - repeat MR brain showed chronic posterior left frontal lobe ischemic change not present on prior imaging. Repeat MRA head/neck stable appearance from prior imaging.  Denies any additional episodes or new stroke/TIA symptoms.  Compliant on Plavix, Crestor and Repatha without side effects.  Blood pressure today 139/95. Prior A1c increased to 7.7 - has since establish care with endocrinology and started on Ozempic. Reports stress eating which caused A1c to increase - depression now stable and improving diet. Has f/u with endo next month.  Routinely followed by cardiology for cardiomyopathy and CHF and pulmonology for OSA on CPAP.  Cardiac monitor completed 12/2020 without evidence of arrhythmia.  No new concerns at this time    History provided for reference purposes only Update 10/30/2020 JM: Joel [name] returns for 6 month stroke follow up accompanied by his wife. Reports continued occasional dizziness and constant right ear tinnitus with hearing loss.  He has not yet obtained hearing aid as advised by Dr. Lucia Gaskins due to financial reasons.  Episode in June/July while riding in the car with his family, hearing  worsened and then had difficulty understanding what was being said ("sounded like robots") and was unable to get out any words. He also experienced increased pressure in his head.  This lasted approx 10 seconds and resolved after closing his eyes and taking deep breaths.  He has not had recurrence of these symptoms.  He has also been experiencing right-sided neck discomfort when looking towards the right.  Reports compliance on Plavix and Crestor and recently added Repatha. Blood pressure today 143/98. Monitors at home, recently increased amlodipine dose and plans to increase coreg from 12.5mg  BID to 25mg  BID. Wife reports patient "was off" one day approx 3-4 months ago and checked Bp which was within normal limits but BP machine said irregular heartbeat. This has not occurred again since that time.  Routinely follows with cardiology but forgot to mention during visit.   No further concerns at this time  Update 04/24/2020 JM: Joel [name] returns for 43-month stroke follow-up accompanied by his wife  Reports residual right ear tinnitus and hearing loss, occasional dizziness more with increased exertion (such as going to the gym to work out), occasional imbalance and short-term memory loss Evaluated by Dr. Lucia Gaskins 11/2019 with right ear moderate severe sensorineural hearing loss recommended follow-up for hearing aid but they are currently waiting to obtain hearing aid due to financial reasons  Short-term memory loss consistent since his stroke -denies long-term memory loss concerns.  Maintains ADLs and IADLs without difficulty Denies new stroke/TIA symptoms  Reports self discontinuing ALL prescribed medications a couple months ago as he wanted to see what medications  he actually still needed to continue on or still needed - reports drastic dietary changes, routine exercise and activity and purposeful 60 lb weight loss since his stroke (03/2019)  He has since been recently restarted back on spironolactone and  carvedilol -BP remains uncontrolled elevated today and typically 160/110-120s at home Cardiology also advised to initiate aspirin 81 mg daily for secondary stroke prevention (as self d/c'd plavix) Not currently on diabetic regimen as glucose levels recently in the 80s with recent A1c 6.1 Has f/u w/ endocrinology 3/31 with plans on repeat lab work including lipid panel as he is currently not on statin therapy  Compliant on CPAP for OSA management routinely followed by pulmonology  No further concerns at this time  Update 10/26/2019 JM: Joel [name] returns for stroke follow-up Patient reports residual stroke deficits tinnitus and hearing deficit. He does report with improvement of hearing, the tinnitus worsens. Vertigo improving but can worsen with increased exertion. Quick episode 2-3 seconds and then resolves.  Denies residual anxiety difficulty Denies new stroke/TIA symptoms  Remains on Plavix without bleeding or bruising Remains on Crestor 40 mg daily and Zetia without side effects Blood pressure today 134/88 Glucose levels not routinely monitored at home - typically stable when he does check Reports ongoing use of CPAP for OSA management  During hospital admission, lab work showed concern for antiphospholipid syndrome.  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  He is accompanied by his wife.  Residual deficits of right-sided hearing impairment with tinnitus and dizziness with some improvement.  Referral placed to neuro otologist after prior visit but they have not been called to schedule evaluation.  Previously working with outpatient therapies but currently placed on hold per patient request.  Prior visit concern for underlying anxiety contributing to dizziness sensation with panic attacks  and initiated short course of Xanax.  GAD-7 score remains elevated but he has not had any additional panic attacks and has only used 1 dose of Xanax.  Continues to use meclizine daily with ongoing benefit.  He will experience short episode of vertigo typically daily but only lasts for a few minutes and then resolves.  Continues on DAPT despite 66-month recommendation but denies bleeding or bruising.  Recent follow-up with endocrinology showed continued elevated LDL at 156 therefore switch to atorvastatin to Crestor 40 mg daily as well as addition of Zetia.  Continues to follow with endocrinology for DM management as well with recent A1c 6.1.  Blood pressure today 146/106, asymptomatic.  Does not routinely monitor at home.  Endorses ongoing use of his CPAP for OSA management.  During stroke admission, hypercoagulable work-up consistent with presence of lupus anticoagulant meeting criteria for antiphospholipid syndrome.  No further concerns at this time.  Initial visit 04/21/2019: Joel [name] is a 41 year old male who is being seen today for stroke follow-up accompanied by his wife.  Residual stroke deficits mild right-sided weakness, dizziness, vertigo, nausea and right ear tinnitus with decreased hearing.  Currently working with physical therapy with improvement of his right side but endorses only minimal improvement of vestibular symptoms as well as symptoms waxing/waning.  Previously complaining of headaches which have since improved consisting of generalized pressure radiating down into his neck.  Underlying history of depression and does endorse worsening anxiety since his stroke.  Not currently  on any medication therapy.  Continues on aspirin 325 mg daily and Plavix without bleeding or bruising.  Continues on atorvastatin without myalgias.  Blood pressure today 109/76 and does monitor at home typically 150/110-120.  Prior difficulty with blood pressure which has since improved.  Wife is not sure the accuracy of  home cuff as they are using an older cuff.  Ongoing use of CPAP for OSA management.  No further concerns at this time.  Stroke admission 03/19/2019: Joel [name] is a 41 y.o. male with history of diabetes, CKD stage IIIb, hypertension, hyperlipidemia, EF 45-50%, CHF and hx of medical non compliance,  who presented on 03/19/2019 with dizziness, gait instability, loss of hearing R ear, facial numbness, nausea and vomiting. He did not receive IV t-PA due to late presentation (>4.5 hours from time of onset).  Evaluated by stroke team and Dr. Leonie Man with stroke work-up revealing small acute right middle cerebellar peduncle infarction due to large vessel disease with right AICA occlusion.  MRA head also showed severe stenosis versus artifact in left PCA distal P2/proximal P3 segment, intracranial arthrosclerosis involving anterior and posterior circulation including distal right vertebral artery, basilar artery, supraclinoid right ICA and bilateral MCA branches.  History of HTN and recommend long-term BP goal normotensive range.  LDL 211 and recommended continuation of atorvastatin 80 mg daily with medication compliance education provided.  DM type II controlled A1c 7.0.  History of cardiomyopathy/CHF with current admission EF 55 to 60% (02/2017 EF 25 to 30%) and recommended continued follow-up with cardiology.  Other stroke risk factors include prior EtOH use, morbid obesity, family history of stroke, OSA on CPAP and possible chronic stroke on imaging.  Recommended DAPT for 3 months then Plavix alone as previously on aspirin given intracranial stenosis.  Evaluated by therapies who recommended CIR but patient declined preferring to be discharged home.  Stroke: Small acute right middle cerebellar peduncle infarction due to large vessel disease with right AICA occlusion. Resultant right hemiataxia, mild right hemiparesis, right-sided hearing loss and right face numbness CT no acute abnormalities MRI head W&WO - Small  acute infarction involving the right middle cerebellar peduncle. Corresponding enhancement may reflect early blood brain barrier disruption. Given presence of vascular risk factors, ischemia is favored over demyelination. There is also a probable small chronic infarct near the right frontal horn.  MRA head - Possible occlusion of the right AICA, or it could be diminutive or absent. Severe stenosis versus artifact in the left PCA distal P2/proximal P3 segment. Suggestion of intracranial atherosclerosis involving the anterior and posterior circulation, including mild irregularity of the: distal right vertebral artery, basilar artery, supraclinoid right ICA, and bilateral MCA branches.  TCD bubble study - no PFO 2D Echo - EF 55 - 60%. No cardiac source of emboli identified. Severely increased left ventricular hypertrophy.  Sars Corona Virus 2 - negative LDL - 211 HgbA1c - 7.0 Hypercoagulable labs positive lupus anticoagulant UDS - benzodiazepine VTE prophylaxis - Lovenox 40 aspirin 81 mg daily prior to admission, now on aspirin 325 mg daily and clopidogrel 75 mg daily. Continue DAPT for 3 months and then plavix alone given intracranial stenosis Therapy recommendations:  CIR -> Rehab consult->Pt prefers to go home       ROS:   14 system review of systems performed and negative with exception of those listed in HPI  PMH:  Past Medical History:  Diagnosis Date   Anxiety    Chest pain    Chronic diastolic CHF (congestive heart  failure) (HCC)    Constipation    Depression    GERD (gastroesophageal reflux disease)    Hyperlipidemia    Hypertension    Hypertensive heart disease    Lactose intolerance    Noncompliance with medications    Obesity    Palpitations    Renal disorder    Shortness of breath on exertion    Sleep apnea    Stroke [facility])    Systolic dysfunction    a. TTE 06/2014: EF 45-50%, normal wall motion, mild MR, mild AI    PSH:  Past Surgical History:  Procedure  Laterality Date   NO PAST SURGERIES      Social History:  Social History   Socioeconomic History   Marital status: Married    Spouse name: Not on file   Number of children: 1   Years of education: 12   Highest education level: 12th grade  Occupational History   Occupation: Disabled  Tobacco Use   Smoking status: Never   Smokeless tobacco: Never  Vaping Use   Vaping Use: Never used  Substance and Sexual Activity   Alcohol use: Not Currently    Comment: Very infrequently; ~2-3 drinks/year    Drug use: No   Sexual activity: Not Currently    Birth control/protection: None  Other Topics Concern   Not on file  Social History Narrative   Not on file   Social Determinants of Health   Financial Resource Strain: Low Risk    Difficulty of Paying Living Expenses: Not hard at all  Food Insecurity: Food Insecurity Present   Worried About Programme researcher, broadcasting/film/video in the Last Year: Sometimes true   Ran Out of Food in the Last Year: Sometimes true  Transportation Needs: No Transportation Needs   Lack of Transportation (Medical): No   Lack of Transportation (Non-Medical): No  Physical Activity: Inactive   Days of Exercise per Week: 0 days   Minutes of Exercise per Session: 0 min  Stress: No Stress Concern Present   Feeling of Stress : Not at all  Social Connections: Not on file  Intimate Partner Violence: Not on file    Family History:  Family History  Problem Relation Age of Onset   Cancer Mother    Hypertension Mother    Alcoholism Father    Hyperlipidemia Father    Diabetes Father    CAD Father        a. stenting in his early 64s   Heart disease Father    Stroke Father    Heart attack Father    Diabetes Maternal Grandmother    Diabetes Maternal Grandfather    CAD Paternal Grandmother    CAD Paternal Grandfather    Colon cancer Paternal Grandfather    Diabetes Paternal Uncle     Medications:   Current Outpatient Medications on File Prior to Visit  Medication Sig  Dispense Refill   amLODipine (NORVASC) 10 MG tablet Take 1 tablet (10 mg total) by mouth daily. 90 tablet 3   azelastine (ASTELIN) 0.1 % nasal spray Place 1 spray into both nostrils 2 (two) times daily. Use in each nostril as directed 30 mL 12   carvedilol (COREG) 25 MG tablet Take 1 tablet (25 mg total) by mouth 2 (two) times daily with a meal. 180 tablet 3   clopidogrel (PLAVIX) 75 MG tablet Take 1 tablet (75 mg total) by mouth daily. 90 tablet 3   escitalopram (LEXAPRO) 10 MG tablet Take 15 mg by mouth  daily.     Evolocumab (REPATHA) 140 MG/ML SOSY Inject 140 mg into the skin every 14 (fourteen) days. 2.1 mL 24   furosemide (LASIX) 20 MG tablet Take 1 tablet (20 mg total) by mouth daily as needed. For shortness of breath 90 tablet 3   ipratropium (ATROVENT) 0.03 % nasal spray Place 2 sprays into both nostrils 2 (two) times daily.     montelukast (SINGULAIR) 10 MG tablet Take 1 tablet (10 mg total) by mouth at bedtime. 90 tablet 3   omeprazole (PRILOSEC) 20 MG capsule Take 20 mg by mouth daily.     OZEMPIC, 0.25 OR 0.5 MG/DOSE, 2 MG/1.5ML SOPN Inject 0.5 mg into the skin once a week.     rosuvastatin (CRESTOR) 40 MG tablet Take 1 tablet (40 mg total) by mouth daily. 90 tablet 3   sertraline (ZOLOFT) 100 MG tablet Take 200 mg by mouth daily. (Patient not taking: Reported on 01/15/2021)     No current facility-administered medications on file prior to visit.    Allergies:   Allergies  Allergen Reactions   Bidil [Isosorb Dinitrate-Hydralazine] Other (See Comments)    Severe headache   Nitroglycerin Other (See Comments)    Causes severe headaches     Physical Exam  Vitals:   03/01/21 0803  BP: (!) 139/95  Pulse: 86  Weight: 283 lb (128.4 kg)  Height: 5\' 7"  (1.702 m)   Body mass index is 44.32 kg/m. No results found.   General: well developed, well nourished, pleasant middle aged AA male, seated, in no evident distress Head: head normocephalic and atraumatic.   Neck: supple  with no carotid or supraclavicular bruits Cardiovascular: regular rate and rhythm, no murmurs Musculoskeletal: No deformities Skin:  no rash/petichiae Vascular:  Normal pulses all extremities   Neurologic Exam Mental Status: Awake and fully alert.  Fluent speech and language. Oriented to place and time. Recent and remote memory intact. Attention span, concentration and fund of knowledge appropriate. Mood and affect appropriate Cranial Nerves: Pupils equal, briskly reactive to light. Extraocular movements full without nystagmus. Mild OD convergence insufficiency.  Visual fields full to confrontation.  Facial sensation intact. L nasolabial fold flattening.  Tongue, and palate moves normally and symmetrically.  Motor: Normal bulk and tone.  Normal strength in all tested extremities except slightly decreased grip strength Sensory.:  intact to light touch sensation  Coordination: Rapid alternating movements normal in all extremities except decreased right hand.  Finger-to-nose LUE ataxia and heel-to-shin symmetric. Gait and Station: Arises from chair without difficulty. Stance is normal. Gait demonstrates normal stride and mild imbalance greater when making turns.  Able to tandem walk and heel toe with mild difficulty.  Romberg positive. Reflexes: 1+ and symmetric. Toes downgoing.       PERTINENT IMAGING  MR BRAIN WO CONTRAST  11/12/2020 IMPRESSION: This MRI of the brain without contrast shows the following: 1.   Remote stroke involving the right basis pontis extending to the right middle cerebellar peduncle and entry zone of the right 8th nerve.  This stroke was acute on the 03/19/2019 MRI. 2.   Small remote lacunar infarction in the right frontal lobe adjacent to the frontal horn of the lateral ventricle.  This was also chronic on the 2021 MRI. 3.   T2/FLAIR hyperintense focus in the deep white matter of the posterior left frontal lobe.  This was not present on the 2021 MRI and is most consistent  with a focus of chronic ischemic change.  It did not  appear to be acute on diffusion-weighted images. 4.   Chronic sinusitis involving the maxillary, ethmoid and sphenoid sinuses 5.   No acute findings. 03/19/2019 (W/WO CONTRAST, IAC) IMPRESSION: Small acute infarction involving the right middle cerebellar peduncle. Corresponding enhancement may reflect early blood brain barrier disruption. Given presence of vascular risk factors, ischemia is favored over demyelination. There is also a probable small chronic infarct near the right frontal horn.   MRA HEAD  11/12/2020 IMPRESSION: This MR angiogram of the intracranial arteries shows the following: 1.   Mild stenosis of the supraclinoid segment of the right internal carotid artery, unchanged compared to the 2021 MR angiogram.  This is not hemodynamically significant. 2.   Patchy irregularity of the basilar artery consistent with mild stenosis that is not hemodynamically significant.  This appears stable. 3.   Stenosis within the posterior cerebral arteries, likely stable compared to the previous MR angiogram.  This does not appear to be hemodynamically significant. 4.   Mild irregularity of some of the M2 branches of the middle cerebral arteries could represent mild stenosis that would not be hemodynamically significant.  This is also stable compared to the previous MR angiogram 03/20/2019 IMPRESSION: 1. No large vessel occlusion. Possible occlusion of the right AICA, or it could be diminutive or absent. 2. Severe stenosis versus artifact in the left PCA distal P2/proximal P3 segment. 3. Suggestion of intracranial atherosclerosis involving the anterior and posterior circulation, including mild irregularity of the: distal right vertebral artery, basilar artery, supraclinoid right ICA, and bilateral MCA branches. 4. Non dominant left vertebral artery functionally terminates in PICA. Fetal type bilateral PCA origins.   MRA NECK   11/12/2020 IMPRESSION: This is a mildly abnormal MR angiogram of the neck arteries showing very minimal stenosis at the origin of the right internal carotid artery that is not hemodynamically significant.  Additionally, incidental note is made of a diminutive left vertebral artery, terminating at the left posterior inferior cerebellar artery, a normal variant 03/20/2019 IMPRESSION: Negative neck MRA.  Dominant right vertebral artery.      ASSESSMENT: Joel [name] is a 41 y.o. year old male with right cerebellar peduncle infarct on 03/19/2019 secondary to large vessel disease with R AICA occlusion with residual imbalance, occasional vertigo and right ear sensorineural hearing loss with tinnitus.  Transient episode of aphasia 07/2020 and right-sided deficits noted on exam at prior visit 10/2020 with repeat imaging showing chronic left frontal lobe infarct not present on prior imaging.  Vascular risk factors include HTN, HLD, DM, CHF, OD ocular occlusion 2014, intracranial stenosis and OSA on CPAP.      PLAN:  Right cerebellar stroke:  Continue clopidogrel 75 mg daily and Repatha and Crestor for secondary stroke prevention Hypercoagulable labs negative Discussed secondary stroke prevention measures and importance of close PCP, endocrinology and cardiology follow-up for aggressive stroke risk factor management as well as importance of medication compliance  Left frontal lobe infarct  Possible cause of transient aphasia episode and new right-sided deficits on prior exam Repeat MRA head/neck stable without new findings (see imaging section).  No changes to current regimen as on max medical therapy - continue as noted above Cardiac monitor negative   HTN: BP goal<130/90.  Greatly improving on current regimen per cardiology  HLD: LDL goal <70.  LDL 150 (07/2020) - currently on Repatha and Crestor - repeat cholesterol levels today  DMI: A1c goal <7.0.  A1c 7.7 (11/2020) up from 5.3 (04/2020)  - since  started on Ozempic - has  a f/u with endo 2/16 with plans on repeat imaging  OSA on CPAP: Ongoing compliance for secondary stroke prevention followed routinely by pulmonology Dr. Humphrey Rolls    Follow up in 6 months or call earlier if needed - if remains stable, can discuss follow up as needed   CC:   Joel Hauser, DO    I spent 37 minutes of face-to-face and non-face-to-face time with patient and friend.  This included previsit chart review, lab review, study review, order entry, electronic health record documentation, patient education and discussion regarding above topics, related education and discussion, further evaluation and treatment options and answered all other questions to patient and friends satisfaction   Frann Rider, AGNP-BC  [facility] [address] [place], [place] [address]  Phone [phone] Fax [phone] Note: This document was prepared with digital dictation and possible smart phrase technology. Any transcriptional errors that result from this process are unintentional.

## 2021-03-02 LAB — LIPID PANEL
Chol/HDL Ratio: 3.9 ratio (ref 0.0–5.0)
Cholesterol, Total: 193 mg/dL (ref 100–199)
HDL: 50 mg/dL (ref >39)
LDL Chol Calc (NIH): 118 mg/dL — ABNORMAL HIGH (ref 0–99)
Triglycerides: 142 mg/dL (ref 0–149)
VLDL Cholesterol Cal: 25 mg/dL (ref 5–40)

## 2021-03-12 ENCOUNTER — Other Ambulatory Visit: Payer: Self-pay

## 2021-03-12 MED ORDER — EZETIMIBE 10 MG PO TABS: 10.0000 mg | ORAL_TABLET | Freq: Every day | ORAL | 3 refills | Status: DC

## 2021-04-08 ENCOUNTER — Encounter: Payer: Self-pay | Admitting: Family Medicine

## 2021-04-08 ENCOUNTER — Encounter: Payer: Self-pay | Admitting: Adult Health

## 2021-04-08 DIAGNOSIS — L910 Hypertrophic scar: Secondary | ICD-10-CM

## 2021-05-30 ENCOUNTER — Ambulatory Visit (INDEPENDENT_AMBULATORY_CARE_PROVIDER_SITE_OTHER): Payer: Medicare Other

## 2021-05-30 DIAGNOSIS — G4733 Obstructive sleep apnea (adult) (pediatric): Secondary | ICD-10-CM | POA: Diagnosis not present

## 2021-05-30 NOTE — Progress Notes (Signed)
95 percentile pressure 13.5 ? ? 95th percentile leak 24.3 ? ? apnea index 0.6 /hr ? apnea-hypopnea index  1.0 /hr ? ? total days used  >4 hr 90 days ? total days used <4 hr 0 days ? ?Total compliance 100 percent ? ?He is doing great states can not sleep without, no problems or questions at this time.  ?Pt was seen by Tresa Endo  RRT/RCP  from [facility]  ?

## 2021-06-12 ENCOUNTER — Encounter: Payer: Self-pay | Admitting: Nurse Practitioner

## 2021-06-12 ENCOUNTER — Ambulatory Visit (INDEPENDENT_AMBULATORY_CARE_PROVIDER_SITE_OTHER): Payer: Medicare Other | Admitting: Nurse Practitioner

## 2021-06-12 VITALS — BP 124/90 | HR 100 | Temp 98.6°F | Resp 16 | Ht 67.0 in | Wt 287.2 lb

## 2021-06-12 DIAGNOSIS — Z7189 Other specified counseling: Secondary | ICD-10-CM | POA: Diagnosis not present

## 2021-06-12 DIAGNOSIS — G4733 Obstructive sleep apnea (adult) (pediatric): Secondary | ICD-10-CM | POA: Diagnosis not present

## 2021-06-12 DIAGNOSIS — Z9989 Dependence on other enabling machines and devices: Secondary | ICD-10-CM

## 2021-06-12 NOTE — Progress Notes (Signed)
[facility] [place], [place] [address]  Internal MEDICINE  Office Visit Note  Patient Name: Joel [name]  [id number]  [id number]  Date of Service: 06/12/2021  Chief Complaint  Patient presents with   Follow-up   Sleep Apnea    HPI Joel [name] presents for a follow-up visit for sleep apnea and CPAP use   CPAP download 95 percentile pressure 13.5 95th percentile leak 24.3 apnea index 0.6 /hr apnea-hypopnea index  1.0 /hr total days used  >4 hr 90 days total days used <4 hr 0 days Total compliance 100 percent   --He is doing great states can not sleep without, no problems or questions at this time.  Pt was seen by Claiborne Billings  RRT/RCP  from [facility]   EPWORTH SLEEPINESS SCALE: Scale: (0)= no chance of dozing; (1)= slight chance of dozing; (2)= moderate chance of dozing; (3)= high chance of dozing Chance  Situtation Sitting and reading: 0 Watching TV: 0 Sitting Inactive in public: 0 As a passenger in car: 0   Lying down to rest: 2 Sitting and talking: 0 Sitting quielty after lunch: 0 In a car, stopped in traffic: 0 TOTAL SCORE:   2 out of 24  Patient is doing well with his CPAP and even wears his CPAP if he takes a nap during the day although this does not happen as often now.  His Epworth Sleepiness Scale also improved from 4 out of 24 to 2 out of 24.  He is due for follow-up again in 6 months   Current Medication: Outpatient Encounter Medications as of 06/12/2021  Medication Sig   amLODipine (NORVASC) 10 MG tablet Take 1 tablet (10 mg total) by mouth daily.   carvedilol (COREG) 25 MG tablet Take 1 tablet (25 mg total) by mouth 2 (two) times daily with a meal.   clopidogrel (PLAVIX) 75 MG tablet Take 1 tablet (75 mg total) by mouth daily.   escitalopram (LEXAPRO) 10 MG tablet Take 15 mg by mouth daily.   ezetimibe (ZETIA) 10 MG tablet Take 1 tablet (10 mg total) by mouth daily.   furosemide (LASIX) 20 MG tablet Take 1 tablet (20 mg total) by mouth daily as  needed. For shortness of breath   montelukast (SINGULAIR) 10 MG tablet Take 1 tablet (10 mg total) by mouth at bedtime.   omeprazole (PRILOSEC) 20 MG capsule Take 20 mg by mouth daily.   OZEMPIC, 0.25 OR 0.5 MG/DOSE, 2 MG/1.5ML SOPN Inject 0.5 mg into the skin once a week.   rosuvastatin (CRESTOR) 40 MG tablet Take 1 tablet (40 mg total) by mouth daily.   Evolocumab (REPATHA) 140 MG/ML SOSY Inject 140 mg into the skin every 14 (fourteen) days.   [DISCONTINUED] azelastine (ASTELIN) 0.1 % nasal spray Place 1 spray into both nostrils 2 (two) times daily. Use in each nostril as directed (Patient not taking: Reported on 06/12/2021)   [DISCONTINUED] ipratropium (ATROVENT) 0.03 % nasal spray Place 2 sprays into both nostrils 2 (two) times daily. (Patient not taking: Reported on 06/12/2021)   [DISCONTINUED] sertraline (ZOLOFT) 100 MG tablet Take 200 mg by mouth daily. (Patient not taking: Reported on 01/15/2021)   No facility-administered encounter medications on file as of 06/12/2021.    Surgical History: Past Surgical History:  Procedure Laterality Date   NO PAST SURGERIES      Medical History: Past Medical History:  Diagnosis Date   Anxiety    Chest pain    Chronic diastolic CHF (congestive heart failure) (  HCC)    Constipation    Depression    GERD (gastroesophageal reflux disease)    Hyperlipidemia    Hypertension    Hypertensive heart disease    Lactose intolerance    Noncompliance with medications    Obesity    Palpitations    Renal disorder    Shortness of breath on exertion    Sleep apnea    Stroke (HCC)    Systolic dysfunction    a. TTE 06/2014: EF 45-50%, normal wall motion, mild MR, mild AI    Family History: Family History  Problem Relation Age of Onset   Cancer Mother    Hypertension Mother    Alcoholism Father    Hyperlipidemia Father    Diabetes Father    CAD Father        a. stenting in his early 68s   Heart disease Father    Stroke Father    Heart attack Father     Diabetes Maternal Grandmother    Diabetes Maternal Grandfather    CAD Paternal Grandmother    CAD Paternal Grandfather    Colon cancer Paternal Grandfather    Diabetes Paternal Uncle     Social History   Socioeconomic History   Marital status: Married    Spouse name: Not on file   Number of children: 1   Years of education: 12   Highest education level: 12th grade  Occupational History   Occupation: Disabled  Tobacco Use   Smoking status: Never   Smokeless tobacco: Never  Building services engineer Use: Never used  Substance and Sexual Activity   Alcohol use: Not Currently    Comment: Very infrequently; ~2-3 drinks/year    Drug use: No   Sexual activity: Not Currently    Birth control/protection: None  Other Topics Concern   Not on file  Social History Narrative   Not on file   Social Determinants of Health   Financial Resource Strain: Low Risk    Difficulty of Paying Living Expenses: Not hard at all  Food Insecurity: Food Insecurity Present   Worried About Programme researcher, broadcasting/film/video in the Last Year: Sometimes true   Ran Out of Food in the Last Year: Sometimes true  Transportation Needs: No Transportation Needs   Lack of Transportation (Medical): No   Lack of Transportation (Non-Medical): No  Physical Activity: Inactive   Days of Exercise per Week: 0 days   Minutes of Exercise per Session: 0 min  Stress: No Stress Concern Present   Feeling of Stress : Not at all  Social Connections: Not on file  Intimate Partner Violence: Not on file      Review of Systems  Constitutional:  Negative for chills, fatigue and unexpected weight change.  HENT:  Negative for congestion, rhinorrhea, sneezing and sore throat.   Eyes:  Negative for redness.  Respiratory:  Negative for cough, chest tightness and shortness of breath.   Cardiovascular:  Negative for chest pain and palpitations.  Gastrointestinal:  Negative for abdominal pain, constipation, diarrhea, nausea and vomiting.   Genitourinary:  Negative for dysuria and frequency.  Musculoskeletal:  Negative for arthralgias, back pain, joint swelling and neck pain.  Skin:  Negative for rash.  Neurological: Negative.  Negative for tremors and numbness.  Hematological:  Negative for adenopathy. Does not bruise/bleed easily.  Psychiatric/Behavioral:  Negative for behavioral problems (Depression), sleep disturbance and suicidal ideas. The patient is not nervous/anxious.     Vital Signs: BP 124/90  Pulse 100   Temp 98.6 F (37 C)   Resp 16   Ht 5\' 7"  (1.702 m)   Wt 287 lb 3.2 oz (130.3 kg)   SpO2 98%   BMI 44.98 kg/m    Physical Exam Vitals reviewed.  Constitutional:      General: He is not in acute distress.    Appearance: Normal appearance. He is obese. He is not ill-appearing.  HENT:     Head: Normocephalic and atraumatic.  Eyes:     Pupils: Pupils are equal, round, and reactive to light.  Cardiovascular:     Rate and Rhythm: Normal rate and regular rhythm.  Pulmonary:     Effort: Pulmonary effort is normal. No respiratory distress.  Neurological:     Mental Status: He is alert and oriented to person, place, and time.  Psychiatric:        Mood and Affect: Mood normal.        Behavior: Behavior normal.        Assessment/Plan: 1. OSA on CPAP Doing well, 100% compliance. Can't sleep without it, wakes up well rested.  Epworth sleepiness scale 2/24.   2. CPAP use counseling Does not need any supplies. Encouraged to keep up with routine maintenance and cleaning of the CPAP machine, patient does not have any questions regarding maintenance or cleaning of the machine.  General Counseling: marquavis mardis understanding of the findings of todays visit and agrees with plan of treatment. I have discussed any further diagnostic evaluation that may be needed or ordered today. We also reviewed his medications today. he has been encouraged to call the office with any questions or concerns that should  arise related to todays visit.    No orders of the defined types were placed in this encounter.   No orders of the defined types were placed in this encounter.   Return in about 6 months (around 12/13/2021) for F/U, pulmonary/sleep, [place] Mcnally.   Total time spent:30 Minutes Time spent includes review of chart, medications, test results, and follow up plan with the patient.   [place] Controlled Substance Database was reviewed by me.  This patient was seen by Jonetta Osgood, FNP-C in collaboration with Dr. Clayborn Bigness as a part of collaborative care agreement.   Ova Gillentine R. Valetta Fuller, MSN, FNP-C Internal medicine

## 2021-07-16 NOTE — Progress Notes (Unsigned)
Date:  07/17/2021   ID:  Joel [name], DOB 07-25-1980, MRN [id number]  Patient Location:  [address] [place] [address]   Provider location:   [facility], [place] office  PCP:  [name], DO  Cardiologist:  [name]  Chief Complaint  Patient presents with   6 month follow up    Patient c/o shortness of breath when mowing the lawn. Medications reviewed by the patient verbally.     History of Present Illness:    Joel [name] is a 41 y.o. 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  DM, followed by endocrine HLD,  medication noncompliance,  obesity,  sleep apena on CPAP Hospital admission February 13, 2017 acute on chronic combined CHF hypertensive emergency. Last echocardiogram 2021 ejection fraction 55% He presents for follow-up of his systolic and diastolic CHF  Last seen in clinic by myself December 2022 Reports doing well, active Denies significant shortness of breath or chest pain No tachypalpitations concerning for arrhythmia No recent stroke or TIA symptoms Reports compliance with his medications  A1c of 12 Working with endocrine, Dr. Honor Junes Recent increase to his Ozempic up to 1  Likes to ride his motorcycle Denies soda or sweet tea Weight stable to 80  Blood work reviewed Creatinine 1.7, stable  EKG personally reviewed by myself on todays visit Normal sinus rhythm left axis deviation rate 91 bpm poor R wave progression through the anterior precordial leads  Other past medical history reviewed Acute CVA 02/2019 facial numbness and dizziness and right-sided weakness  2D echo showed normal ejection fraction without cardiac source of  embolism.  Seen by neurology Recommend dual antiplatelet therapy with aspirin and Plavix for 3 weeks followed by Plavix alone  MR ANGIO HEAD WO CONTRAST 03/20/2019 1. No large vessel occlusion. Possible occlusion of the right AICA, or it could be diminutive or absent.  2. Severe stenosis versus artifact in the left PCA distal P2/proximal P3 segment.  3. Suggestion of intracranial atherosclerosis involving the anterior and posterior circulation, including mild irregularity of the: distal right vertebral artery, basilar artery, supraclinoid right ICA, and bilateral MCA branches.  4. Non dominant left vertebral artery functionally terminates in PICA. Fetal type bilateral PCA origins.    MR ANGIO NECK   03/20/2019 Negative neck MRA. Dominant right vertebral artery.    MR BRAIN 03/19/2019 Small acute infarction involving the right middle cerebellar peduncle. Corresponding enhancement may reflect early blood brain barrier disruption. Given presence of vascular risk factors, ischemia is favored over demyelination. There is also a probable small chronic infarct near the right frontal horn.   Presented to the hospital 02/12/17/18 due to HF and HTN.  Hypertensive urgency on arrival Echo report from 02/12/17 EF of 25-30%  Aggressive diuresis, blood pressure control  Echo 02/2017 Left ventricle: The cavity size was normal. There was moderate   concentric hypertrophy. Systolic function was severely reduced.   The estimated ejection fraction was in the range of 25% to 30%.   Diffuse hypokinesis. Regional wall motion abnormalities cannot be   excluded. Features are consistent with a pseudonormal left   ventricular filling pattern, with concomitant abnormal relaxation   and increased filling pressure (grade 2 diastolic dysfunction).   Past Medical History:  Diagnosis Date   Anxiety    Chest pain    Chronic diastolic CHF (congestive heart failure) (HCC)    Constipation    Depression    GERD  (gastroesophageal reflux disease)    Hyperlipidemia    Hypertension    Hypertensive heart disease    Lactose intolerance    Noncompliance with medications    Obesity    Palpitations    Renal disorder    Shortness of breath on exertion    Sleep apnea    Stroke [facility])    Systolic dysfunction    a. TTE 06/2014: EF 45-50%, normal wall motion, mild MR, mild AI   Past Surgical History:  Procedure Laterality Date   NO PAST SURGERIES       Current Outpatient Medications on File Prior to Visit  Medication Sig Dispense Refill   amLODipine (NORVASC) 10 MG tablet Take 1 tablet (10 mg total) by mouth daily. 90 tablet 3   carvedilol (COREG) 25 MG tablet Take 1 tablet (25 mg total) by mouth 2 (two) times daily with a meal. 180 tablet 3   clopidogrel (PLAVIX) 75 MG tablet Take 1 tablet (75 mg total) by mouth daily. 90 tablet 3   escitalopram (LEXAPRO) 10 MG tablet Take 15 mg by mouth daily.     Evolocumab (REPATHA) 140 MG/ML SOSY Inject 140 mg into the skin every 14 (fourteen) days. 2.1 mL 24   ezetimibe (ZETIA) 10 MG tablet Take 1 tablet (10 mg total) by mouth daily. 90 tablet 3   furosemide (LASIX) 20 MG tablet Take 1 tablet (20 mg total) by mouth daily as needed. For shortness of breath 90 tablet 3   montelukast (SINGULAIR) 10 MG tablet Take 1 tablet (10 mg total) by mouth at bedtime. 90 tablet 3   omeprazole (PRILOSEC) 20 MG capsule Take 20 mg by mouth daily.     OZEMPIC, 0.25 OR 0.5 MG/DOSE, 2 MG/1.5ML SOPN Inject 0.5 mg into the skin once a week.     rosuvastatin (CRESTOR) 40 MG tablet Take 1 tablet (40 mg total) by mouth daily. 90 tablet 3   No current facility-administered medications on file prior to visit.     Allergies:   Bidil [isosorb dinitrate-hydralazine] and Nitroglycerin , H/A  Social History   Tobacco Use   Smoking status: Never   Smokeless tobacco: Never  Vaping Use   Vaping Use: Never used  Substance Use Topics   Alcohol use: Not Currently    Comment: Very  infrequently; ~2-3 drinks/year    Drug use: No    Family Hx: The patient's family history includes Alcoholism in his father; CAD in his father, paternal grandfather, and paternal grandmother; Cancer in his mother; Colon cancer in his paternal grandfather; Diabetes in his father, maternal grandfather, maternal grandmother, and paternal uncle; Heart attack in his father; Heart disease in his father; Hyperlipidemia in his father; Hypertension in his mother; Stroke in his father.  ROS:   Please see the history of present illness.    Review of Systems  Constitutional: Negative.   HENT: Negative.    Respiratory: Negative.    Cardiovascular: Negative.   Gastrointestinal: Negative.   Musculoskeletal: Negative.   Neurological: Negative.   Psychiatric/Behavioral: Negative.    All other systems reviewed and are negative.   Labs/Other Tests and Data Reviewed:    Recent Labs: 07/25/2020: ALT 16; BUN 22; Creatinine, Ser 1.59; Hemoglobin 14.6; Platelets 224; Potassium 4.0; Sodium 141; TSH 1.980   Recent Lipid Panel Lab  Results  Component Value Date/Time   CHOL 193 03/01/2021 08:35 AM   CHOL 361 (H) 06/11/2014 12:32 PM   TRIG 142 03/01/2021 08:35 AM   TRIG 246 (H) 06/11/2014 12:32 PM   HDL 50 03/01/2021 08:35 AM   HDL 41 06/11/2014 12:32 PM   CHOLHDL 3.9 03/01/2021 08:35 AM   CHOLHDL 12.6 03/20/2019 01:13 AM   LDLCALC 118 (H) 03/01/2021 08:35 AM   LDLCALC 271 (H) 06/11/2014 12:32 PM    Wt Readings from Last 3 Encounters:  07/17/21 282 lb 6 oz (128.1 kg)  06/12/21 287 lb 3.2 oz (130.3 kg)  03/01/21 283 lb (128.4 kg)     Exam:    Vital Signs: Vital signs may also be detailed in the HPI BP 110/90 (BP Location: Left Arm, Patient Position: Sitting, Cuff Size: Large)   Pulse 87   Ht 5\' 7"  (1.702 m)   Wt 282 lb 6 oz (128.1 kg)   SpO2 98%   BMI 44.23 kg/m   Constitutional:  oriented to person, place, and time. No distress.  HENT:  Head: Grossly normal Eyes:  no discharge. No scleral  icterus.  Neck: No JVD, no carotid bruits  Cardiovascular: Regular rate and rhythm, no murmurs appreciated Pulmonary/Chest: Clear to auscultation bilaterally, no wheezes or rails Abdominal: Soft.  no distension.  no tenderness.  Musculoskeletal: Normal range of motion Neurological:  normal muscle tone. Coordination normal. No atrophy Skin: Skin warm and dry Psychiatric: normal affect, pleasant   ASSESSMENT & PLAN:    Dilated cardiomyopathy ([place]) EF has fluctuated 45 to 50% then dropped 25 to 30% in 2019 Ejection fraction back up to normal January 2021 holding off on Entresto ACE ARB given renal failure Stressed the importance of aggressive blood pressure control given likely hypertensive heart disease  systolic and diastolic CHF, chronic (HCC) -  Takes Lasix as needed, appears euvolemic Blood pressure well controlled, most recent echocardiogram with ejection fraction 55% No changes to his medications Stressed importance of aggressive diabetes control  Essential hypertension intolerance to BiDil, isosorbide Avoiding ACE, ARB, Entresto in the setting of renal failure Recommend he continue amlodipine, carvedilol  No changes to his medications today, recommended lifestyle modification, weight loss  History of stroke Residual right-sided deficits  cerebrovascular disease  on MRI/MRA  Plavix continued  on Repatha, Crestor Zetia Aggressive management of cholesterol  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

## 2021-07-17 ENCOUNTER — Ambulatory Visit (INDEPENDENT_AMBULATORY_CARE_PROVIDER_SITE_OTHER): Payer: Medicare Other | Admitting: Cardiovascular Disease

## 2021-07-17 ENCOUNTER — Encounter: Payer: Self-pay | Admitting: Cardiovascular Disease

## 2021-07-17 VITALS — BP 110/90 | HR 87 | Ht 67.0 in | Wt 282.4 lb

## 2021-07-17 DIAGNOSIS — I10 Essential (primary) hypertension: Secondary | ICD-10-CM

## 2021-07-17 DIAGNOSIS — I420 Dilated cardiomyopathy: Secondary | ICD-10-CM | POA: Diagnosis not present

## 2021-07-17 DIAGNOSIS — N1831 Chronic kidney disease, stage 3a: Secondary | ICD-10-CM

## 2021-07-17 DIAGNOSIS — I951 Orthostatic hypotension: Secondary | ICD-10-CM

## 2021-07-17 DIAGNOSIS — E1022 Type 1 diabetes mellitus with diabetic chronic kidney disease: Secondary | ICD-10-CM

## 2021-07-17 DIAGNOSIS — I5022 Chronic systolic (congestive) heart failure: Secondary | ICD-10-CM | POA: Diagnosis not present

## 2021-07-17 DIAGNOSIS — I110 Hypertensive heart disease with heart failure: Secondary | ICD-10-CM

## 2021-07-17 DIAGNOSIS — I639 Cerebral infarction, unspecified: Secondary | ICD-10-CM | POA: Diagnosis not present

## 2021-07-17 DIAGNOSIS — N1832 Chronic kidney disease, stage 3b: Secondary | ICD-10-CM

## 2021-07-17 NOTE — Patient Instructions (Addendum)
Medication Instructions:  No changes  Check and make sure you are on ozempic 1mg  (per endocrine).  If you need a refill on your cardiac medications before your next appointment, please call your pharmacy.   Lab work: No new labs needed  Testing/Procedures: No new testing needed  Follow-Up: At [facility], you and your health needs are our priority.  As part of our continuing mission to provide you with exceptional heart care, we have created designated Provider Care Teams.  These Care Teams include your primary Cardiologist (physician) and Advanced Practice Providers (APPs -  Physician Assistants and Nurse Practitioners) who all work together to provide you with the care you need, when you need it.  You will need a follow up appointment in 6 months  Providers on your designated Care Team:   [facility], NP Nicolasa Ducking, PA-C Cadence Eula Listen, Fransico Michael  COVID-19 Vaccine Information can be found at: [place] For questions related to vaccine distribution or appointments, please email [email or web address] or call [phone].

## 2021-07-22 ENCOUNTER — Encounter: Payer: Self-pay | Admitting: Nurse Practitioner

## 2021-08-02 ENCOUNTER — Ambulatory Visit
Admission: RE | Admit: 2021-08-02 | Discharge: 2021-08-02 | Disposition: A | Discharge status: Home / Self Care | Payer: Medicare Other | Source: Ambulatory Visit | Attending: Family Medicine | Admitting: Family Medicine

## 2021-08-02 ENCOUNTER — Ambulatory Visit
Admission: RE | Admit: 2021-08-02 | Discharge: 2021-08-02 | Disposition: A | Discharge status: Home / Self Care | Payer: Medicare Other | Source: Home / Self Care | Attending: Family Medicine | Admitting: Family Medicine

## 2021-08-02 ENCOUNTER — Ambulatory Visit: Payer: Medicare Other | Admitting: Family Medicine

## 2021-08-02 ENCOUNTER — Encounter: Payer: Self-pay | Admitting: Family Medicine

## 2021-08-02 VITALS — BP 132/86 | HR 96 | Ht 67.0 in | Wt 281.0 lb

## 2021-08-02 DIAGNOSIS — G8929 Other chronic pain: Secondary | ICD-10-CM

## 2021-08-02 DIAGNOSIS — M7551 Bursitis of right shoulder: Secondary | ICD-10-CM | POA: Diagnosis not present

## 2021-08-02 DIAGNOSIS — M25511 Pain in right shoulder: Secondary | ICD-10-CM | POA: Insufficient documentation

## 2021-08-02 IMAGING — DX DG SHOULDER 2+V*R*
4 series · 4 of 4 positions shown · non-contrast
Comparison: None Available.

CLINICAL DATA: Right shoulder pain for 2 months. Bursitis. Evaluate
for arthritis or other injury.

EXAM:
RIGHT SHOULDER - 2+ VIEW

[shoulder ap]
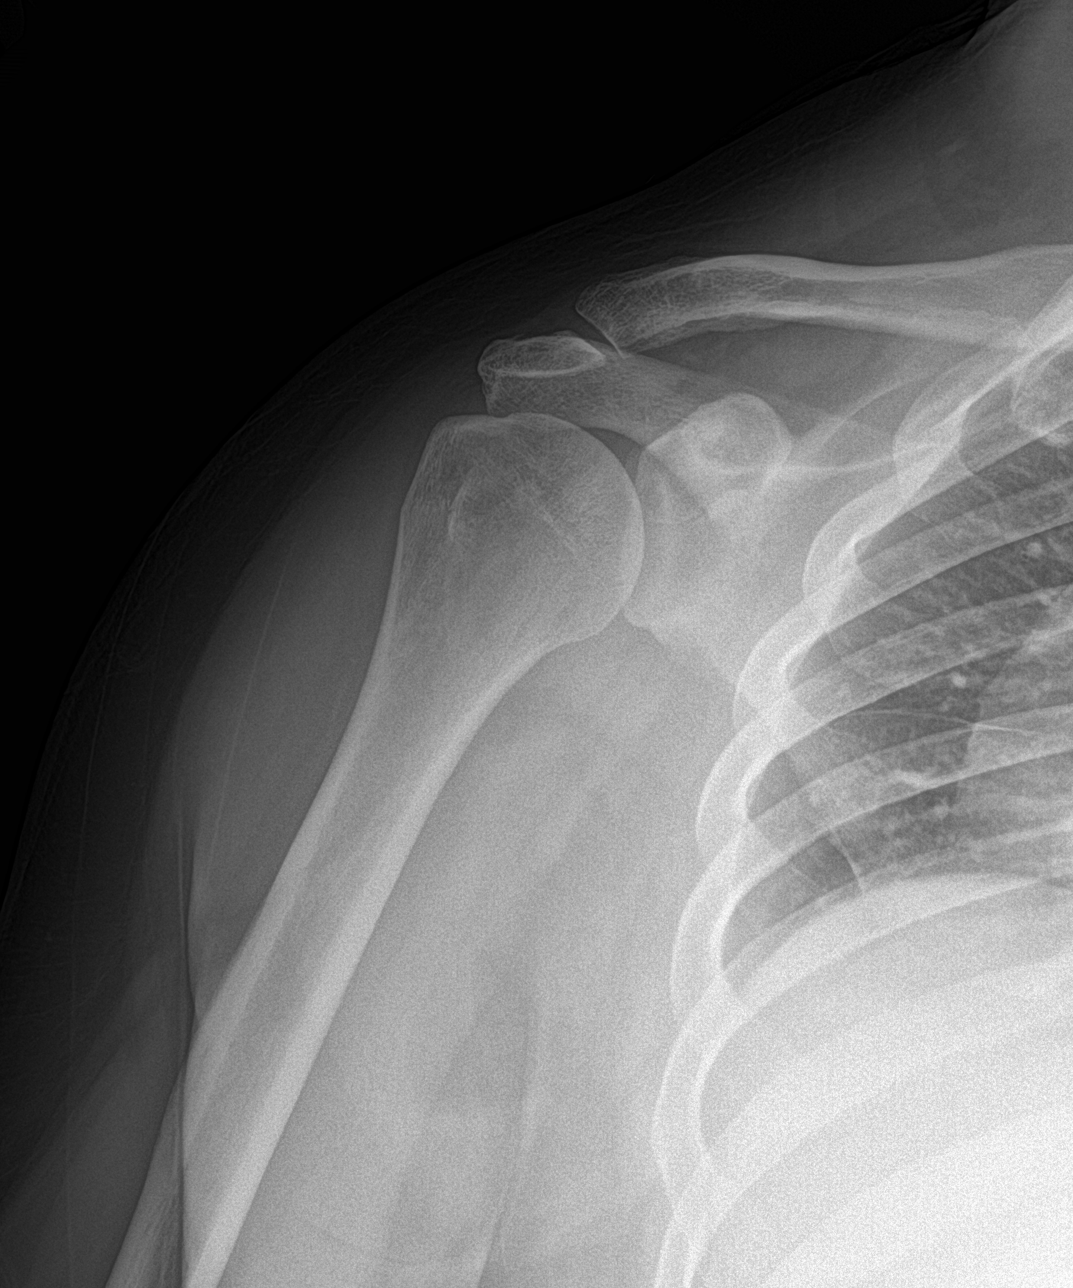

[shoulder y-view (1 of 2)]
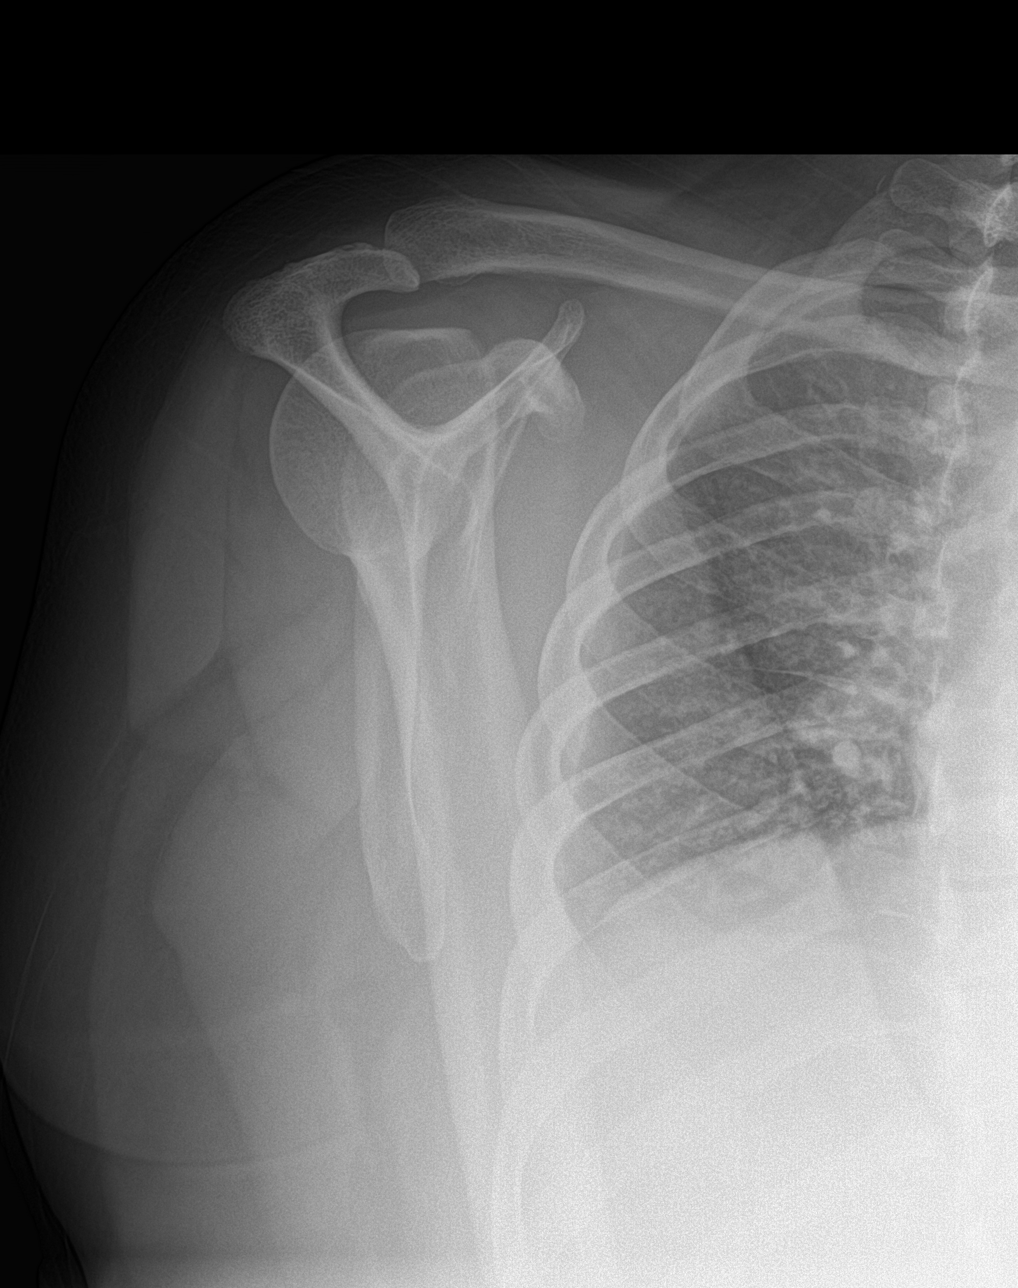

[shoulder axial]
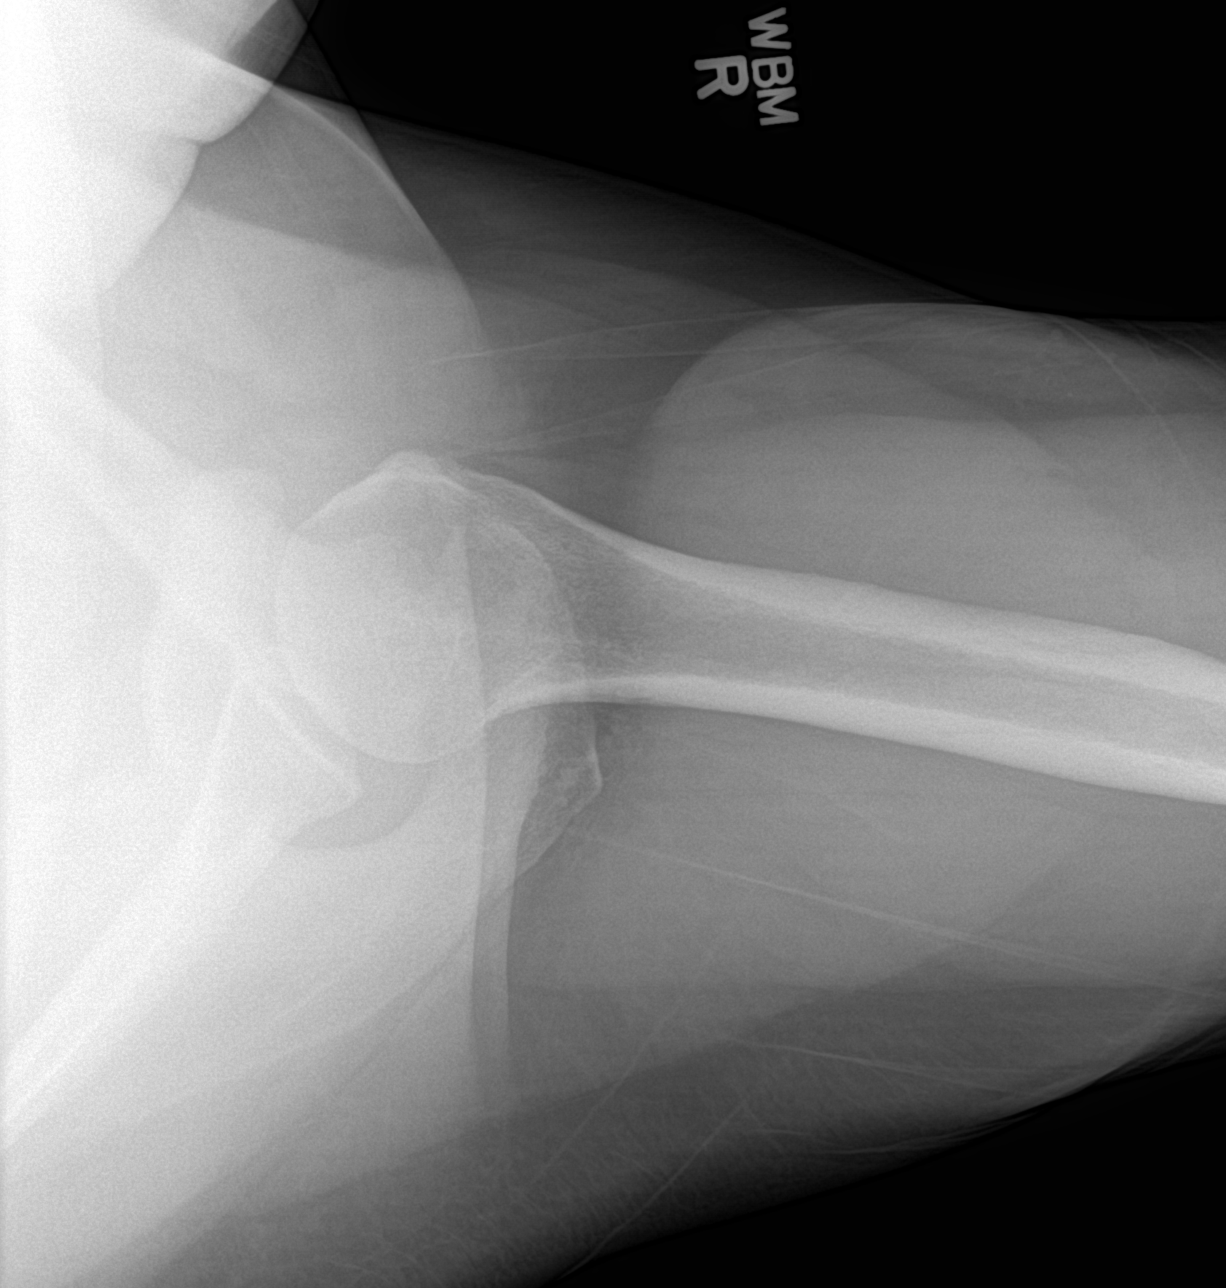

[shoulder y-view (2 of 2)]
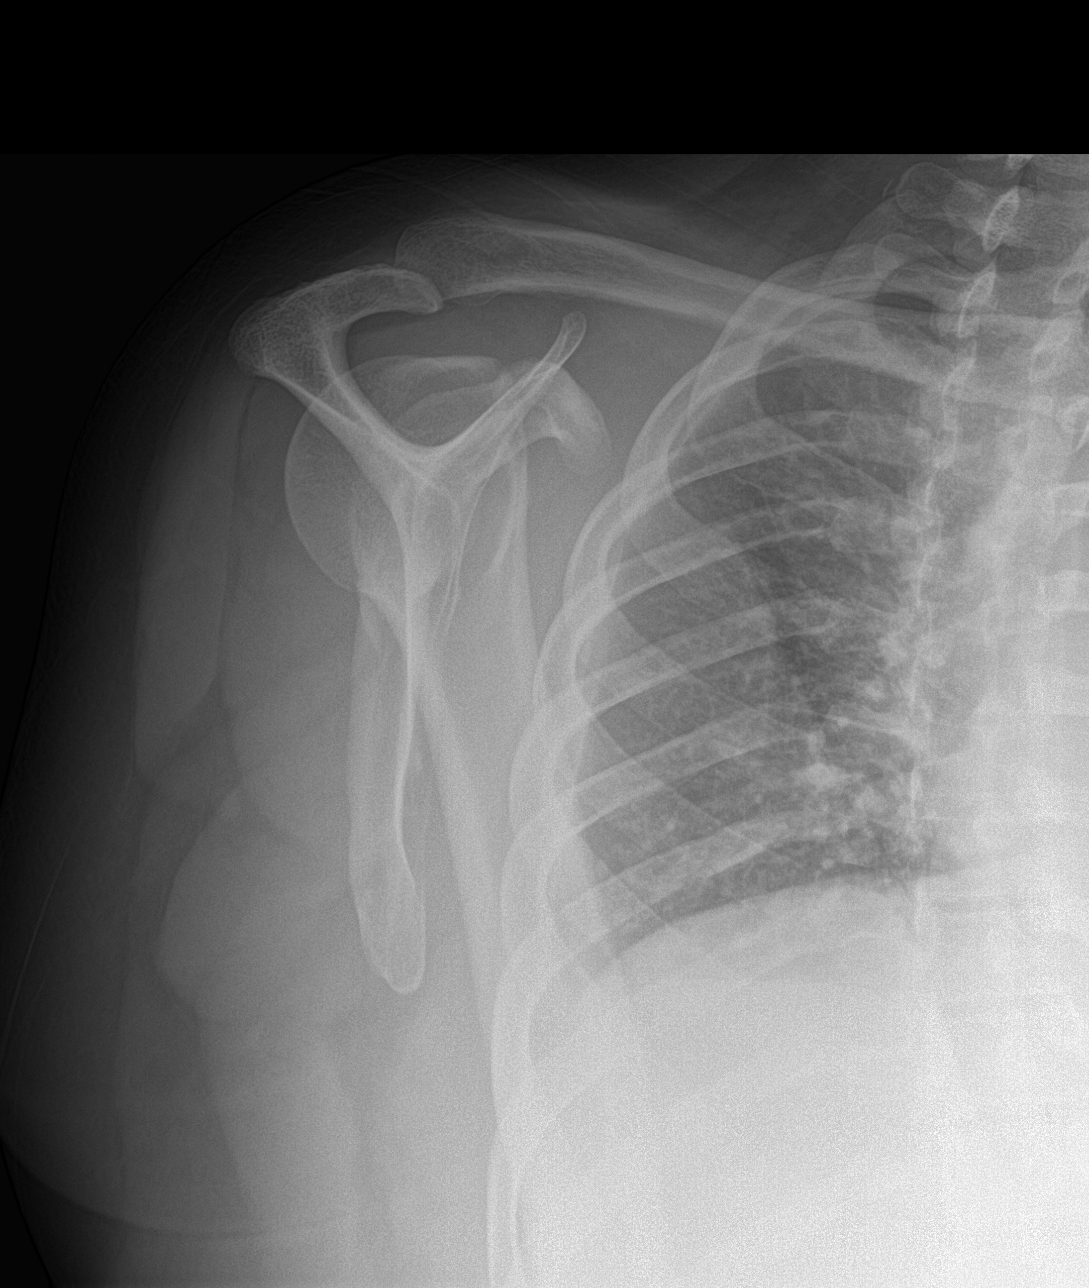

[4 of 4 positions shown; findings below may reference images not displayed]

FINDINGS: Bone mineralization is subjectively normal. Normal alignment. Normal
joint spaces. No significant osteophytes. No erosions. No evidence
of a vascular necrosis. No focal bone lesion or bony destruction. No
soft tissue calcifications.
IMPRESSION: Negative radiographs of the right shoulder.

## 2021-08-02 MED ORDER — CYCLOBENZAPRINE HCL 10 MG PO TABS: 10.0000 mg | ORAL_TABLET | Freq: Two times a day (BID) | ORAL | 2 refills | Status: DC | PRN

## 2021-08-02 NOTE — Patient Instructions (Addendum)
Thank you for coming to the office today.  Most likely you have bursitis of your shoulder. This is inflammation of the shoulder joint caused most often by arthritis or wear and tear. Often it can flare up to cause bursitis due to repetitive activities or other triggers. It may take time to heal, possibly 2 to 6 weeks, and it is important to avoid over use of shoulder especially above head motions that can re-aggravate the problem.  Recommend trial of Anti-inflammatory  START anti inflammatory topical - OTC Voltaren (generic Diclofenac) topical 2-4 times a day as needed for pain swelling of affected joint for 1-2 weeks or longer.  Recommend to start taking Tylenol Extra Strength 500mg  tabs - take 1 to 2 tabs per dose (max 1000mg ) every 6-8 hours for pain (take regularly, don't skip a dose for next 7 days), max 24 hour daily dose is 6 tablets or 3000mg . In the future you can repeat the same everyday Tylenol course for 1-2 weeks at a time.   Start Cyclobenzapine (Flexeril) 10mg  tablets (muscle relaxant) - start with half (cut) to one whole pill at night for muscle relaxant - may make you sedated or sleepy (be careful driving or working on this) if tolerated you can take half to whole tab 2 to 3 times daily or every 8 hours as needed   X-ray today - stay tuned for results.  Lastly if not improved by about 2-6 weeks - or just need treatment sooner - call or message  We can refer to Sports Medicine or return for a Steroid Injection in shoulder.   Please schedule a Follow-up Appointment to: Return in about 4 weeks (around 08/30/2021), or if symptoms worsen or fail to improve, for 4 weeks right shoulder bursitis.  If you have any other questions or concerns, please feel free to call the office or send a message through MyChart. You may also schedule an earlier appointment if necessary.  Additionally, you may be receiving a survey about your experience at our office within a few days to 1 week by  e-mail or mail. We value your feedback.  , DO [facility], [facility]  Range of Motion Shoulder Exercises  Pendulum Circles - Lean with your good arm against a counter or table for support [facility] forward with a wide stance (make sure your body is comfortable) - Your painful shoulder should hang down and feel "heavy" - Gently move your painful arm in small circles "clockwise" for several turns - Switch to "counterclockwise" for several turns - Early on keep circles narrow and move slowly - Later in rehab, move in larger circles and faster movement   Wall Crawl - Stand close (about 1-2 ft away) to a wall, facing it directly - Reach out with your arm of painful shoulder and place fingers (not palm) on wall - You should make contact with wall at your waist level - Slowly walk your fingers up the wall. Stay in contact with wall entire time, do not remove fingers - Keep walking fingers up wall until you reach shoulder level - You may feel tightening or mild discomfort, once you reach a height that causes pain or if you are already above your shoulder height then stop. Repeat from starting position. - Early on stand closer to wall, move fingers slowly, and stay at or below shoulder level - Later in rehab, stand farther away from wall (fingertips), move fingers quicker, go above shoulder level

## 2021-08-02 NOTE — Progress Notes (Signed)
Subjective:    Patient ID: Joel [name], male    DOB: Sep 25, 1980, 41 y.o.   MRN: [id number]  Joel [name] is a 41 y.o. male presenting on 08/02/2021 for Shoulder Pain   HPI  R Shoulder Pain Reports onset about 2 months. No actual injury or trauma. He said gradual worsening. Bengay mutes the pain but does not resolve it Can get worse at night Feels like "muscles tearing away from the bone"  He admits some gradual worsening overall in past 2 months. He can do some activities without problem such as lifting groceries, then even non weight bearing can lift arm and it can trigger pain.  Admits sharp localized pain in R superior aspect of shoulder Denies radiating pain into rest of arm and no paresthesia  No prior R Shoulder injury. No other issues with rotator cuff or dislocation. No imaging on file.      08/02/2021   10:34 AM 10/11/2020    9:08 AM 10/03/2020    2:45 PM  Depression screen PHQ 2/9  Decreased Interest 2 3 3   Down, Depressed, Hopeless 3 3 3   PHQ - 2 Score 5 6 6   Altered sleeping 3 3 3   Tired, decreased energy 2 2 3   Change in appetite 3 3 3   Feeling bad or failure about yourself  2 3 3   Trouble concentrating 1 2 2   Moving slowly or fidgety/restless 0 0 0  Suicidal thoughts 1 0 0  PHQ-9 Score 17 19 20   Difficult doing work/chores Somewhat difficult Extremely dIfficult Somewhat difficult    Social History   Tobacco Use   Smoking status: Never   Smokeless tobacco: Never  Vaping Use   Vaping Use: Never used  Substance Use Topics   Alcohol use: Not Currently    Comment: Very infrequently; ~2-3 drinks/year    Drug use: No    Review of Systems Per HPI unless specifically indicated above     Objective:    BP 132/86   Pulse 96   Ht 5\' 7"  (1.702 m)   Wt 281 lb (127.5 kg)   SpO2 98%   BMI 44.01 kg/m   Wt Readings from Last 3 Encounters:  08/02/21 281 lb (127.5 kg)  07/17/21 282 lb 6 oz (128.1 kg)  06/12/21 287 lb 3.2 oz (130.3 kg)    Physical  Exam Vitals and nursing note reviewed.  Constitutional:      General: He is not in acute distress.    Appearance: Normal appearance. He is well-developed. He is not diaphoretic.     Comments: Well-appearing, comfortable, cooperative  HENT:     Head: Normocephalic and atraumatic.  Eyes:     General:        Right eye: No discharge.        Left eye: No discharge.     Conjunctiva/sclera: Conjunctivae normal.  Cardiovascular:     Rate and Rhythm: Normal rate.  Pulmonary:     Effort: Pulmonary effort is normal.  Musculoskeletal:     Comments: RIGHT Shoulder Inspection: Normal appearance bilateral symmetrical Palpation: Non-tender to palpation over anterior, lateral, or posterior shoulder  ROM: Reduced ROM forward flex and abduction due to pain above shoulder level. Intact internal rotation Special Testing: Rotator cuff testing positive for pain without weakness supraspintaus, and positive impingement AC Strength: Normal strength 5/5 flex/ext, ext rot / int rot, grip, rotator cuff str testing. Neurovascular: Distally intact pulses, sensation to light touch   Skin:    General:  Skin is warm and dry.     Findings: No erythema or rash.  Neurological:     Mental Status: He is alert and oriented to person, place, and time.  Psychiatric:        Mood and Affect: Mood normal.        Behavior: Behavior normal.        Thought Content: Thought content normal.     Comments: Well groomed, good eye contact, normal speech and thoughts    Results for orders placed or performed in visit on 03/01/21  Lipid Panel  Result Value Ref Range   Cholesterol, Total 193 100 - 199 mg/dL   Triglycerides 993 0 - 149 mg/dL   HDL 50 >71 mg/dL   VLDL Cholesterol Cal 25 5 - 40 mg/dL   LDL Chol Calc (NIH) 696 (H) 0 - 99 mg/dL   Chol/HDL Ratio 3.9 0.0 - 5.0 ratio      Assessment & Plan:   Problem List Items Addressed This Visit   None Visit Diagnoses     Bursitis of right shoulder    -  Primary   Relevant  Medications   cyclobenzaprine (FLEXERIL) 10 MG tablet   Other Relevant Orders   DG Shoulder Right   Chronic right shoulder pain       Relevant Medications   cyclobenzaprine (FLEXERIL) 10 MG tablet   Other Relevant Orders   DG Shoulder Right       Consistent with subacute Right-shoulder bursitis vs rotator cuff tendinopathy with some reduced active ROM but without significant evidence of muscle tear (no weakness).  Concern with some repetitive actions with shoulders, not overhead. No clear etiology of injury - No imaging on chart  Plan: X-ray today R Shoulder  1. Start TOPICAL NSAID Voltaren - Add Flexeril 10mg  BID PRN muscle relaxant, caution sedation 2. May take Tylenol Ex Str 1-2 q 6 hr PRN 3. Relative rest but keep shoulder mobile, demonstrated ROM exercises, avoid heavy lifting 4. May try heating pad PRN 5. Follow-up 4-6 weeks if not improved for re-evaluation, consider referral to Physical Therapy vs Sports Med vs subacromial steroid injection   Orders Placed This Encounter  Procedures   DG Shoulder Right    Standing Status:   Future    Number of Occurrences:   1    Standing Expiration Date:   08/03/2022    Order Specific Question:   Reason for Exam (SYMPTOM  OR DIAGNOSIS REQUIRED)    Answer:   Right shoulder pain 2 months+ bursitis, eval for arthritis and other injury    Order Specific Question:   Preferred imaging location?    Answer:   [facility] 08/05/2022     Meds ordered this encounter  Medications   cyclobenzaprine (FLEXERIL) 10 MG tablet    Sig: Take 1 tablet (10 mg total) by mouth 2 (two) times daily as needed for muscle spasms.    Dispense:  30 tablet    Refill:  2      Follow up plan: Return in about 4 weeks (around 08/30/2021), or if symptoms worsen or fail to improve, for 4 weeks right shoulder bursitis.   09/01/2021, DO [facility] 08/02/2021, 10:12 AM

## 2021-08-28 ENCOUNTER — Encounter: Payer: Self-pay | Admitting: Family Medicine

## 2021-08-28 DIAGNOSIS — L910 Hypertrophic scar: Secondary | ICD-10-CM

## 2021-08-28 NOTE — Progress Notes (Unsigned)
[facility] [address] [place]. [place] [address] [phone]       STROKE FOLLOW UP NOTE  [name] Date of Birth:  1980/11/17 Medical Record Number:  [id number]   Reason for Referral: stroke follow up    CHIEF COMPLAINT:  No chief complaint on file.    HPI:    Update 08/29/2021 JM: Patient returns for 48-month stroke follow-up.  Overall stable without new stroke/TIA symptoms.  Reports residual right ear hearing loss and occasional dizziness, stable since prior visit.  Compliant on Plavix, Repatha, Crestor and Zetia, denies side effects.  Zetia added by cardiology 02/2021 after repeat lipid panel showed LDL 118 (although improved from prior at 150) Blood pressure today *** Most recent A1c 11.3 (down from prior A1c 12.1) -follows with endocrinology Reports nightly CPAP compliance Routinely follows with PCP, endocrinology, cardiology and pulmonology       History provided for reference purposes only Update 03/01/2021 JM: Returns for 60-month stroke follow-up accompanied by family friend, Joel [name].  Overall stable. Residual right ear hearing loss with tinnitus and occasional dizziness stable. Re-eval by Dr. Lucia Gaskins 11/2020 unchanged right ear sensorineural hearing loss and continued recommendation of hearing aid.  Prior transient episode of aphasia and right sided deficits not seen on exam prior - repeat MR brain showed chronic posterior left frontal lobe ischemic change not present on prior imaging. Repeat MRA head/neck stable appearance from prior imaging.  Denies any additional episodes or new stroke/TIA symptoms.  Compliant on Plavix, Crestor and Repatha without side effects.  Blood pressure today 139/95. Prior A1c increased to 7.7 - has since establish care with endocrinology and started on Ozempic. Reports stress eating which caused A1c to increase - depression now stable and improving diet. Has f/u with endo next month.  Routinely followed by  cardiology for cardiomyopathy and CHF and pulmonology for OSA on CPAP.  Cardiac monitor completed 12/2020 without evidence of arrhythmia.  No new concerns at this time  Update 10/30/2020 JM: Joel [name] returns for 6 month stroke follow up accompanied by his wife. Reports continued occasional dizziness and constant right ear tinnitus with hearing loss.  He has not yet obtained hearing aid as advised by Dr. Lucia Gaskins due to financial reasons.  Episode in June/July while riding in the car with his family, hearing worsened and then had difficulty understanding what was being said ("sounded like robots") and was unable to get out any words. He also experienced increased pressure in his head.  This lasted approx 10 seconds and resolved after closing his eyes and taking deep breaths.  He has not had recurrence of these symptoms.  He has also been experiencing right-sided neck discomfort when looking towards the right.  Reports compliance on Plavix and Crestor and recently added Repatha. Blood pressure today 143/98. Monitors at home, recently increased amlodipine dose and plans to increase coreg from 12.5mg  BID to 25mg  BID. Wife reports patient "was off" one day approx 3-4 months ago and checked Bp which was within normal limits but BP machine said irregular heartbeat. This has not occurred again since that time.  Routinely follows with cardiology but forgot to mention during visit.   No further concerns at this time  Update 04/24/2020 JM: Joel [name] returns for 35-month stroke follow-up accompanied by his wife  Reports residual right ear tinnitus and hearing loss, occasional dizziness more with increased exertion (such as going to the gym to work out), occasional imbalance and short-term memory loss Evaluated by Dr. Lucia Gaskins  11/2019 with right ear moderate severe sensorineural hearing loss recommended follow-up for hearing aid but they are currently waiting to obtain hearing aid due to financial reasons  Short-term  memory loss consistent since his stroke -denies long-term memory loss concerns.  Maintains ADLs and IADLs without difficulty Denies new stroke/TIA symptoms  Reports self discontinuing ALL prescribed medications a couple months ago as he wanted to see what medications he actually still needed to continue on or still needed - reports drastic dietary changes, routine exercise and activity and purposeful 60 lb weight loss since his stroke (03/2019)  He has since been recently restarted back on spironolactone and carvedilol -BP remains uncontrolled elevated today and typically 160/110-120s at home Cardiology also advised to initiate aspirin 81 mg daily for secondary stroke prevention (as self d/c'd plavix) Not currently on diabetic regimen as glucose levels recently in the 80s with recent A1c 6.1 Has f/u w/ endocrinology 3/31 with plans on repeat lab work including lipid panel as he is currently not on statin therapy  Compliant on CPAP for OSA management routinely followed by pulmonology  No further concerns at this time  Update 10/26/2019 JM: Joel [name] returns for stroke follow-up Patient reports residual stroke deficits tinnitus and hearing deficit. He does report with improvement of hearing, the tinnitus worsens. Vertigo improving but can worsen with increased exertion. Quick episode 2-3 seconds and then resolves.  Denies residual anxiety difficulty Denies new stroke/TIA symptoms  Remains on Plavix without bleeding or bruising Remains on Crestor 40 mg daily and Zetia without side effects Blood pressure today 134/88 Glucose levels not routinely monitored at home - typically stable when he does check Reports ongoing use of CPAP for OSA management  During hospital admission, lab work showed concern for antiphospholipid syndrome.  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  He is accompanied by his wife.  Residual deficits of right-sided hearing impairment with tinnitus and dizziness with some improvement.  Referral placed to neuro otologist after prior visit but they have not been called to schedule evaluation.  Previously working with outpatient therapies but currently placed on hold per patient request.  Prior visit concern for underlying anxiety contributing to dizziness sensation with panic attacks and initiated short course of Xanax.  GAD-7 score remains elevated but he has not had any additional panic attacks and has only used 1 dose of Xanax.  Continues to use meclizine daily with ongoing benefit.  He will experience short episode of vertigo typically daily but only lasts for a few minutes and then resolves.  Continues on DAPT despite 58-month recommendation but denies bleeding or bruising.  Recent follow-up with endocrinology showed continued elevated LDL at 156 therefore switch to atorvastatin to Crestor 40 mg daily as well as addition of Zetia.  Continues to follow with endocrinology for DM management as well with recent A1c 6.1.  Blood pressure today 146/106, asymptomatic.  Does not routinely monitor at home.  Endorses ongoing use of his CPAP for OSA management.  During stroke admission, hypercoagulable work-up consistent with presence of lupus anticoagulant meeting criteria for antiphospholipid syndrome.  No further concerns at this time.  Initial visit 04/21/2019: Joel [name] is a 41 year old male who is being seen today for stroke follow-up accompanied by his wife.  Residual stroke deficits mild right-sided weakness,  dizziness, vertigo, nausea and right ear tinnitus with decreased hearing.  Currently working with physical therapy with improvement of his right side but endorses only minimal improvement of vestibular symptoms as well as symptoms waxing/waning.  Previously complaining of headaches  which have since improved consisting of generalized pressure radiating down into his neck.  Underlying history of depression and does endorse worsening anxiety since his stroke.  Not currently on any medication therapy.  Continues on aspirin 325 mg daily and Plavix without bleeding or bruising.  Continues on atorvastatin without myalgias.  Blood pressure today 109/76 and does monitor at home typically 150/110-120.  Prior difficulty with blood pressure which has since improved.  Wife is not sure the accuracy of home cuff as they are using an older cuff.  Ongoing use of CPAP for OSA management.  No further concerns at this time.  Stroke admission 03/19/2019: Joel [name] is a 41 y.o. male with history of diabetes, CKD stage IIIb, hypertension, hyperlipidemia, EF 45-50%, CHF and hx of medical non compliance,  who presented on 03/19/2019 with dizziness, gait instability, loss of hearing R ear, facial numbness, nausea and vomiting. He did not receive IV t-PA due to late presentation (>4.5 hours from time of onset).  Evaluated by stroke team and Dr. Leonie Man with stroke work-up revealing small acute right middle cerebellar peduncle infarction due to large vessel disease with right AICA occlusion.  MRA head also showed severe stenosis versus artifact in left PCA distal P2/proximal P3 segment, intracranial arthrosclerosis involving anterior and posterior circulation including distal right vertebral artery, basilar artery, supraclinoid right ICA and bilateral MCA branches.  History of HTN and recommend long-term BP goal normotensive range.  LDL 211 and recommended continuation of atorvastatin 80 mg daily with medication compliance education provided.  DM type II controlled A1c 7.0.  History of cardiomyopathy/CHF with current admission EF 55 to 60% (02/2017 EF 25 to 30%) and recommended continued follow-up with cardiology.  Other stroke risk factors include prior EtOH use, morbid obesity, family history of stroke, OSA on CPAP  and possible chronic stroke on imaging.  Recommended DAPT for 3 months then Plavix alone as previously on aspirin given intracranial stenosis.  Evaluated by therapies who recommended CIR but patient declined preferring to be discharged home.  Stroke: Small acute right middle cerebellar peduncle infarction due to large vessel disease with right AICA occlusion. Resultant right hemiataxia, mild right hemiparesis, right-sided hearing loss and right face numbness CT no acute abnormalities MRI head W&WO - Small acute infarction involving the right middle cerebellar peduncle. Corresponding enhancement may reflect early blood brain barrier disruption. Given presence of vascular risk factors, ischemia is favored over demyelination. There is also a probable small chronic infarct near the right frontal horn.  MRA head - Possible occlusion of the right AICA, or it could be diminutive or absent. Severe stenosis versus artifact in the left PCA distal P2/proximal P3 segment. Suggestion of intracranial atherosclerosis involving the anterior and posterior circulation, including mild irregularity of the: distal right vertebral artery, basilar artery, supraclinoid right ICA, and bilateral MCA branches.  TCD bubble study - no PFO 2D Echo - EF 55 - 60%. No cardiac source of emboli identified. Severely increased left ventricular hypertrophy.  Sars Corona Virus 2 - negative LDL - 211 HgbA1c - 7.0 Hypercoagulable labs positive lupus anticoagulant UDS - benzodiazepine VTE prophylaxis - Lovenox 40 aspirin 81 mg daily prior to admission, now on aspirin 325 mg daily and clopidogrel 75 mg daily. Continue DAPT for  3 months and then plavix alone given intracranial stenosis Therapy recommendations:  CIR -> Rehab consult->Pt prefers to go home       ROS:   14 system review of systems performed and negative with exception of those listed in HPI  PMH:  Past Medical History:  Diagnosis Date   Anxiety    Chest pain     Chronic diastolic CHF (congestive heart failure) (HCC)    Constipation    Depression    GERD (gastroesophageal reflux disease)    Hyperlipidemia    Hypertension    Hypertensive heart disease    Lactose intolerance    Noncompliance with medications    Obesity    Palpitations    Renal disorder    Shortness of breath on exertion    Sleep apnea    Stroke (HCC)    Systolic dysfunction    a. TTE 06/2014: EF 45-50%, normal wall motion, mild MR, mild AI    PSH:  Past Surgical History:  Procedure Laterality Date   NO PAST SURGERIES      Social History:  Social History   Socioeconomic History   Marital status: Married    Spouse name: Not on file   Number of children: 1   Years of education: 12   Highest education level: 12th grade  Occupational History   Occupation: Disabled  Tobacco Use   Smoking status: Never   Smokeless tobacco: Never  Vaping Use   Vaping Use: Never used  Substance and Sexual Activity   Alcohol use: Not Currently    Comment: Very infrequently; ~2-3 drinks/year    Drug use: No   Sexual activity: Not Currently    Birth control/protection: None  Other Topics Concern   Not on file  Social History Narrative   Not on file   Social Determinants of Health   Financial Resource Strain: Low Risk  (10/03/2020)   Overall Financial Resource Strain (CARDIA)    Difficulty of Paying Living Expenses: Not hard at all  Food Insecurity: Food Insecurity Present (10/03/2020)   Hunger Vital Sign    Worried About Running Out of Food in the Last Year: Sometimes true    Ran Out of Food in the Last Year: Sometimes true  Transportation Needs: No Transportation Needs (10/03/2020)   PRAPARE - Hydrologist (Medical): No    Lack of Transportation (Non-Medical): No  Physical Activity: Inactive (10/03/2020)   Exercise Vital Sign    Days of Exercise per Week: 0 days    Minutes of Exercise per Session: 0 min  Stress: No Stress Concern Present  (10/03/2020)   [place]    Feeling of Stress : Not at all  Social Connections: Moderately Isolated (02/21/2017)   Social Connection and Isolation Panel [NHANES]    Frequency of Communication with Friends and Family: Once a week    Frequency of Social Gatherings with Friends and Family: Once a week    Attends Religious Services: Never    Marine scientist or Organizations: No    Attends Archivist Meetings: Never    Marital Status: Married  Human resources officer Violence: Not At Risk (02/21/2017)   Humiliation, Afraid, Rape, and Kick questionnaire    Fear of Current or Ex-Partner: No    Emotionally Abused: No    Physically Abused: No    Sexually Abused: No    Family History:  Family History  Problem Relation Age of  Onset   Cancer Mother    Hypertension Mother    Alcoholism Father    Hyperlipidemia Father    Diabetes Father    CAD Father        a. stenting in his early 47s   Heart disease Father    Stroke Father    Heart attack Father    Diabetes Maternal Grandmother    Diabetes Maternal Grandfather    CAD Paternal Grandmother    CAD Paternal Grandfather    Colon cancer Paternal Grandfather    Diabetes Paternal Uncle     Medications:   Current Outpatient Medications on File Prior to Visit  Medication Sig Dispense Refill   amLODipine (NORVASC) 10 MG tablet Take 1 tablet (10 mg total) by mouth daily. 90 tablet 3   carvedilol (COREG) 25 MG tablet Take 1 tablet (25 mg total) by mouth 2 (two) times daily with a meal. 180 tablet 3   clopidogrel (PLAVIX) 75 MG tablet Take 1 tablet (75 mg total) by mouth daily. 90 tablet 3   cyclobenzaprine (FLEXERIL) 10 MG tablet Take 1 tablet (10 mg total) by mouth 2 (two) times daily as needed for muscle spasms. 30 tablet 2   escitalopram (LEXAPRO) 10 MG tablet Take 15 mg by mouth daily.     Evolocumab (REPATHA) 140 MG/ML SOSY Inject 140 mg into the skin every 14  (fourteen) days. 2.1 mL 24   ezetimibe (ZETIA) 10 MG tablet Take 1 tablet (10 mg total) by mouth daily. 90 tablet 3   furosemide (LASIX) 20 MG tablet Take 1 tablet (20 mg total) by mouth daily as needed. For shortness of breath 90 tablet 3   montelukast (SINGULAIR) 10 MG tablet Take 1 tablet (10 mg total) by mouth at bedtime. 90 tablet 3   omeprazole (PRILOSEC) 20 MG capsule Take 20 mg by mouth daily.     OZEMPIC, 0.25 OR 0.5 MG/DOSE, 2 MG/1.5ML SOPN Inject 0.5 mg into the skin once a week.     rosuvastatin (CRESTOR) 40 MG tablet Take 1 tablet (40 mg total) by mouth daily. 90 tablet 3   No current facility-administered medications on file prior to visit.    Allergies:   Allergies  Allergen Reactions   Bidil [Isosorb Dinitrate-Hydralazine] Other (See Comments)    Severe headache   Nitroglycerin Other (See Comments)    Causes severe headaches     Physical Exam  There were no vitals filed for this visit.  There is no height or weight on file to calculate BMI. No results found.   General: well developed, well nourished, pleasant middle aged AA male, seated, in no evident distress Head: head normocephalic and atraumatic.   Neck: supple with no carotid or supraclavicular bruits Cardiovascular: regular rate and rhythm, no murmurs Musculoskeletal: No deformities Skin:  no rash/petichiae Vascular:  Normal pulses all extremities   Neurologic Exam Mental Status: Awake and fully alert.  Fluent speech and language. Oriented to place and time. Recent and remote memory intact. Attention span, concentration and fund of knowledge appropriate. Mood and affect appropriate Cranial Nerves: Pupils equal, briskly reactive to light. Extraocular movements full without nystagmus. Mild OD convergence insufficiency.  Visual fields full to confrontation.  Facial sensation intact. L nasolabial fold flattening.  Tongue, and palate moves normally and symmetrically.  Motor: Normal bulk and tone.  Normal  strength in all tested extremities except slightly decreased grip strength Sensory.:  intact to light touch sensation  Coordination: Rapid alternating movements normal in all extremities  except decreased right hand.  Finger-to-nose LUE ataxia and heel-to-shin symmetric. Gait and Station: Arises from chair without difficulty. Stance is normal. Gait demonstrates normal stride and mild imbalance greater when making turns.  Able to tandem walk and heel toe with mild difficulty.  Romberg positive. Reflexes: 1+ and symmetric. Toes downgoing.       PERTINENT IMAGING  MR BRAIN WO CONTRAST  11/12/2020 IMPRESSION: This MRI of the brain without contrast shows the following: 1.   Remote stroke involving the right basis pontis extending to the right middle cerebellar peduncle and entry zone of the right 8th nerve.  This stroke was acute on the 03/19/2019 MRI. 2.   Small remote lacunar infarction in the right frontal lobe adjacent to the frontal horn of the lateral ventricle.  This was also chronic on the 2021 MRI. 3.   T2/FLAIR hyperintense focus in the deep white matter of the posterior left frontal lobe.  This was not present on the 2021 MRI and is most consistent with a focus of chronic ischemic change.  It did not appear to be acute on diffusion-weighted images. 4.   Chronic sinusitis involving the maxillary, ethmoid and sphenoid sinuses 5.   No acute findings. 03/19/2019 (W/WO CONTRAST, IAC) IMPRESSION: Small acute infarction involving the right middle cerebellar peduncle. Corresponding enhancement may reflect early blood brain barrier disruption. Given presence of vascular risk factors, ischemia is favored over demyelination. There is also a probable small chronic infarct near the right frontal horn.   MRA HEAD  11/12/2020 IMPRESSION: This MR angiogram of the intracranial arteries shows the following: 1.   Mild stenosis of the supraclinoid segment of the right internal carotid artery, unchanged  compared to the 2021 MR angiogram.  This is not hemodynamically significant. 2.   Patchy irregularity of the basilar artery consistent with mild stenosis that is not hemodynamically significant.  This appears stable. 3.   Stenosis within the posterior cerebral arteries, likely stable compared to the previous MR angiogram.  This does not appear to be hemodynamically significant. 4.   Mild irregularity of some of the M2 branches of the middle cerebral arteries could represent mild stenosis that would not be hemodynamically significant.  This is also stable compared to the previous MR angiogram 03/20/2019 IMPRESSION: 1. No large vessel occlusion. Possible occlusion of the right AICA, or it could be diminutive or absent. 2. Severe stenosis versus artifact in the left PCA distal P2/proximal P3 segment. 3. Suggestion of intracranial atherosclerosis involving the anterior and posterior circulation, including mild irregularity of the: distal right vertebral artery, basilar artery, supraclinoid right ICA, and bilateral MCA branches. 4. Non dominant left vertebral artery functionally terminates in PICA. Fetal type bilateral PCA origins.   MRA NECK  11/12/2020 IMPRESSION: This is a mildly abnormal MR angiogram of the neck arteries showing very minimal stenosis at the origin of the right internal carotid artery that is not hemodynamically significant.  Additionally, incidental note is made of a diminutive left vertebral artery, terminating at the left posterior inferior cerebellar artery, a normal variant 03/20/2019 IMPRESSION: Negative neck MRA.  Dominant right vertebral artery.      ASSESSMENT: Joel [name] is a 41 y.o. year old male with right cerebellar peduncle infarct on 03/19/2019 secondary to large vessel disease with R AICA occlusion with residual imbalance, occasional vertigo and right ear sensorineural hearing loss with tinnitus.  Transient episode of aphasia 07/2020 and right-sided deficits  noted on exam at 10/2020 visit with repeat imaging showing chronic left  frontal lobe infarct not present on prior imaging.  Vascular risk factors include HTN, HLD, DM, CHF, OD ocular occlusion 2014, intracranial stenosis and OSA on CPAP.      PLAN:  Right cerebellar stroke:  Left frontal lobe infarct: Continue clopidogrel 75 mg daily and Repatha and Crestor for secondary stroke prevention Hypercoagulable labs negative Cardiac monitor 12/2020 no evidence of A-fib Discussed secondary stroke prevention measures and importance of close PCP, endocrinology and cardiology follow-up for aggressive stroke risk factor management including BP goal<130/90, HLD with LDL goal<70 and DM with A1c.<7 as well as importance of medication compliance Stroke labs: LDL 118 (02/2021), A1c 11.3 (07/2021) - repeat lipid panel today - will defer further/ongoing management to cardiology  OSA on CPAP: Ongoing compliance for secondary stroke prevention followed routinely by pulmonology Dr. Sallyanne Havers from stroke standpoint without further recommendations and risk factors are managed by PCP, endocrinology, cardiology and pulmonology. He may follow up PRN, as usual for our patients who are strictly being followed for stroke. If any new neurological issues should arise, request PCP place referral for evaluation by one of our neurologists. Thank you.     CC:   [name], DO    I spent 37 minutes of face-to-face and non-face-to-face time with patient and friend.  This included previsit chart review, lab review, study review, order entry, electronic health record documentation, patient education and discussion regarding above topics, related education and discussion, further evaluation and treatment options and answered all other questions to patient and friends satisfaction   Frann Rider, AGNP-BC  [facility] [address] [place], [place] [address]  Phone  [phone] Fax [phone] Note: This document was prepared with digital dictation and possible smart phrase technology. Any transcriptional errors that result from this process are unintentional.

## 2021-08-29 ENCOUNTER — Encounter: Payer: Self-pay | Admitting: Adult Health

## 2021-08-29 ENCOUNTER — Ambulatory Visit (INDEPENDENT_AMBULATORY_CARE_PROVIDER_SITE_OTHER): Payer: Medicare Other | Admitting: Adult Health

## 2021-08-29 ENCOUNTER — Other Ambulatory Visit: Payer: Self-pay | Admitting: Cardiovascular Disease

## 2021-08-29 VITALS — BP 152/110 | HR 96 | Ht 67.0 in | Wt 284.0 lb

## 2021-08-29 DIAGNOSIS — I639 Cerebral infarction, unspecified: Secondary | ICD-10-CM | POA: Diagnosis not present

## 2021-08-29 DIAGNOSIS — I69398 Other sequelae of cerebral infarction: Secondary | ICD-10-CM | POA: Diagnosis not present

## 2021-08-29 DIAGNOSIS — E785 Hyperlipidemia, unspecified: Secondary | ICD-10-CM

## 2021-08-29 DIAGNOSIS — H9041 Sensorineural hearing loss, unilateral, right ear, with unrestricted hearing on the contralateral side: Secondary | ICD-10-CM | POA: Diagnosis not present

## 2021-08-29 DIAGNOSIS — R42 Dizziness and giddiness: Secondary | ICD-10-CM

## 2021-08-29 NOTE — Patient Instructions (Addendum)
Continue clopidogrel 75 mg daily  and Zetia and Crestor  for secondary stroke prevention  Continue nightly use of CPAP for sleep apnea management   Will follow up with cardiology regarding cholesterol management   Continue to follow up with PCP/endocrinology regarding cholesterol, blood pressure and diabetes management  Maintain strict control of hypertension with blood pressure goal below 130/90, diabetes with hemoglobin A1c goal below 7.0 % and cholesterol with LDL cholesterol (bad cholesterol) goal below 70 mg/dL.   Signs of a Stroke? Follow the BEFAST method:  Balance Watch for a sudden loss of balance, trouble with coordination or vertigo Eyes Is there a sudden loss of vision in one or both eyes? Or double vision?  Face: Ask the person to smile. Does one side of the face droop or is it numb?  Arms: Ask the person to raise both arms. Does one arm drift downward? Is there weakness or numbness of a leg? Speech: Ask the person to repeat a simple phrase. Does the speech sound slurred/strange? Is the person confused ? Time: If you observe any of these signs, call 911.        Thank you for coming to see [place] at [facility]. I hope we have been able to provide you high quality care today.  You may receive a patient satisfaction survey over the next few weeks. We would appreciate your feedback and comments so that we may continue to improve ourselves and the health of our patients.

## 2021-09-03 NOTE — Progress Notes (Signed)
POD 3, can someone look into the lab work that was ordered last week? He was advised to have them completed, not sure if he left prior to completing these. If he did not complete, please have him come back to office to complete or he can go to any [facility] office to have these completed (we would just need to know which lab to fax the orders to). If he did complete, can you have Jeanice Lim look into this further? Thank you!

## 2021-09-04 ENCOUNTER — Telehealth: Payer: Self-pay | Admitting: *Deleted

## 2021-09-04 NOTE — Telephone Encounter (Signed)
Lab released by [facility], [facility] in our office.

## 2021-09-04 NOTE — Telephone Encounter (Signed)
Called patient, reached wif,e on DPR, advised her that patient didn't get lipid panel done on 08/29/21 after office visit. They live in [place]; I advised will have [facility] release lab. She will take him to [facility] near their home. She verbalized understanding, appreciation.

## 2021-09-04 NOTE — Telephone Encounter (Signed)
Noted! Thank you

## 2021-09-11 ENCOUNTER — Ambulatory Visit: Payer: Medicare Other | Admitting: Family Medicine

## 2021-09-18 NOTE — Progress Notes (Signed)
POD 3, Can someone look into results for recent lipid panel? For some reason it is not resulting. Thank you!

## 2021-09-19 ENCOUNTER — Telehealth: Payer: Self-pay | Admitting: *Deleted

## 2021-09-19 ENCOUNTER — Encounter (INDEPENDENT_AMBULATORY_CARE_PROVIDER_SITE_OTHER): Payer: Self-pay

## 2021-09-19 NOTE — Telephone Encounter (Signed)
Called [facility], spoke with Candise Bowens to get lipid panel result. She stated she can't find patient in her system. She checked [facility] account, didn't see his lab coming to them on 09/04/21 or 09/05/21. Per Rogene Houston, she did not collect lab from  patient that day. Called patient who stated he is not sure, advised I call his wife. Called wife who stated she forgot to take him to [facility] in [place]. She stated she will try to remember to take him tonight. She verbalized understanding, appreciation.

## 2021-09-19 NOTE — Telephone Encounter (Signed)
Okay. Thank you for looking into this.   Towana Badger - hopefully will get this done! I will keep you updated about the results!  I appreciate you following up on this!

## 2021-09-20 ENCOUNTER — Telehealth: Payer: Self-pay | Admitting: Pharmacist

## 2021-09-20 LAB — LIPID PANEL
Chol/HDL Ratio: 5 ratio (ref 0.0–5.0)
Cholesterol, Total: 141 mg/dL (ref 100–199)
HDL: 28 mg/dL — ABNORMAL LOW (ref >39)
LDL Chol Calc (NIH): 87 mg/dL (ref 0–99)
Triglycerides: 145 mg/dL (ref 0–149)
VLDL Cholesterol Cal: 26 mg/dL (ref 5–40)

## 2021-09-20 MED ORDER — ROSUVASTATIN CALCIUM 40 MG PO TABS: 40.0000 mg | ORAL_TABLET | Freq: Every day | ORAL | 3 refills | Status: DC

## 2021-09-20 MED ORDER — EZETIMIBE 10 MG PO TABS: 10.0000 mg | ORAL_TABLET | Freq: Every day | ORAL | 3 refills | Status: DC

## 2021-09-20 MED ORDER — PRALUENT 75 MG/ML ~~LOC~~ SOAJ: 1.0000 | SUBCUTANEOUS | 3 refills | Status: DC

## 2021-09-20 NOTE — Telephone Encounter (Signed)
Received message from neuro that pt has been out of his Repatha due to difficulty with refills. Had lipids checked yesterday, LDL 87 above goal < 55 given premature ASCVD. Pt already taking rosuvastatin 40mg  daily and ezetimibe 10mg  daily. Looks like his formulary changed and no longer covers Repatha but does cover Praluent. Prior authorization has been submitted and approved through 09/20/22. Pt aware of lab results and to start Praluent injections every 2 weeks and continue his statin and ezetimibe.

## 2021-09-20 NOTE — Telephone Encounter (Signed)
Perfect. Thank you so much! I appreciate your help!

## 2021-10-09 ENCOUNTER — Ambulatory Visit (INDEPENDENT_AMBULATORY_CARE_PROVIDER_SITE_OTHER): Payer: Medicare Other

## 2021-10-09 DIAGNOSIS — Z0000 Encounter for general adult medical examination without abnormal findings: Secondary | ICD-10-CM

## 2021-10-09 NOTE — Progress Notes (Signed)
Subjective:   Joel [name] is a 41 y.o. male who presents for Medicare Annual/Subsequent preventive examination.  Review of Systems    Per HPI unless specifically indicated below Cardiac Risk Factors include: advanced age (>6men, >86 women);male gender, hypertensive, diabetes mellitus, and History of CVA.           Objective:    Today's Vitals   10/09/21 1506 10/09/21 1513  PainSc: 6  6   PainLoc: Shoulder       08/29/2021    8:11 AM 08/02/2021   10:02 AM 07/17/2021    8:28 AM  Vitals with BMI  Height 5\' 7"  5\' 7"  5\' 7"   Weight 284 lbs 281 lbs 282 lbs 6 oz  BMI [id number] 44 0000000  Systolic 0000000 [id number]  Diastolic A999333 86 90  Pulse 96 96 87    There is no height or weight on file to calculate BMI.     10/03/2020    2:44 PM 03/19/2019    9:16 PM 03/19/2019    3:15 PM 03/03/2019   11:37 PM 11/13/2018    1:14 PM 03/03/2017    6:11 PM 02/12/2017    1:56 PM  Advanced Directives  Does Patient Have a Medical Advance Directive? No  No No No No No  Would patient like information on creating a medical advance directive?  No - Patient declined  No - Patient declined No - Patient declined  No - Patient declined    Current Medications (verified) Outpatient Encounter Medications as of 10/09/2021  Medication Sig   Alirocumab (PRALUENT) 75 MG/ML SOAJ Inject 1 Pen into the skin every 14 (fourteen) days.   amLODipine (NORVASC) 10 MG tablet Take 1 tablet (10 mg total) by mouth daily.   carvedilol (COREG) 25 MG tablet Take 1 tablet (25 mg total) by mouth 2 (two) times daily with a meal.   clopidogrel (PLAVIX) 75 MG tablet Take 1 tablet (75 mg total) by mouth daily.   cyclobenzaprine (FLEXERIL) 10 MG tablet Take 1 tablet (10 mg total) by mouth 2 (two) times daily as needed for muscle spasms.   escitalopram (LEXAPRO) 10 MG tablet Take 15 mg by mouth daily.   ezetimibe (ZETIA) 10 MG tablet Take 1 tablet (10 mg total) by mouth daily.   furosemide (LASIX) 20 MG tablet Take 1 tablet (20 mg total) by  mouth daily as needed. For shortness of breath   montelukast (SINGULAIR) 10 MG tablet Take 1 tablet (10 mg total) by mouth at bedtime.   omeprazole (PRILOSEC) 20 MG capsule Take 20 mg by mouth daily.   OZEMPIC, 0.25 OR 0.5 MG/DOSE, 2 MG/1.5ML SOPN Inject 2 mg into the skin once a week.   rosuvastatin (CRESTOR) 40 MG tablet Take 1 tablet (40 mg total) by mouth daily.   topiramate (TOPAMAX) 100 MG tablet Take 100 mg by mouth at bedtime.   TRESIBA FLEXTOUCH 100 UNIT/ML FlexTouch Pen Inject 22 Units into the skin daily.   No facility-administered encounter medications on file as of 10/09/2021.    Allergies (verified) Bidil [isosorb dinitrate-hydralazine] and Nitroglycerin   History: Past Medical History:  Diagnosis Date   Anxiety    Chest pain    Chronic diastolic CHF (congestive heart failure) (HCC)    Constipation    Depression    GERD (gastroesophageal reflux disease)    Hyperlipidemia    Hypertension    Hypertensive heart disease    Lactose intolerance    Noncompliance with medications    Obesity  Palpitations    Renal disorder    Shortness of breath on exertion    Sleep apnea    Stroke (HCC)    Systolic dysfunction    a. TTE 06/2014: EF 45-50%, normal wall motion, mild MR, mild AI   Past Surgical History:  Procedure Laterality Date   NO PAST SURGERIES     Family History  Problem Relation Age of Onset   Cancer Mother    Hypertension Mother    Alcoholism Father    Hyperlipidemia Father    Diabetes Father    CAD Father        a. stenting in his early 74s   Heart disease Father    Stroke Father    Heart attack Father    Diabetes Maternal Grandmother    Diabetes Maternal Grandfather    CAD Paternal Grandmother    CAD Paternal Grandfather    Colon cancer Paternal Grandfather    Diabetes Paternal Uncle    Social History   Socioeconomic History   Marital status: Married    Spouse name: Joel [name]   Number of children: 1   Years of education: 12   Highest  education level: 12th grade  Occupational History   Occupation: Disabled  Tobacco Use   Smoking status: Never   Smokeless tobacco: Never  Vaping Use   Vaping Use: Never used  Substance and Sexual Activity   Alcohol use: Not Currently    Comment: Very infrequently; ~2-3 drinks/year    Drug use: No   Sexual activity: Not Currently    Birth control/protection: None  Other Topics Concern   Not on file  Social History Narrative   Not on file   Social Determinants of Health   Financial Resource Strain: Medium Risk (10/09/2021)   Overall Financial Resource Strain (CARDIA)    Difficulty of Paying Living Expenses: Somewhat hard  Food Insecurity: Food Insecurity Present (10/03/2020)   Hunger Vital Sign    Worried About Running Out of Food in the Last Year: Sometimes true    Ran Out of Food in the Last Year: Sometimes true  Transportation Needs: No Transportation Needs (10/09/2021)   PRAPARE - Hydrologist (Medical): No    Lack of Transportation (Non-Medical): No  Physical Activity: Inactive (10/03/2020)   Exercise Vital Sign    Days of Exercise per Week: 0 days    Minutes of Exercise per Session: 0 min  Stress: No Stress Concern Present (10/03/2020)   [place]    Feeling of Stress : Not at all  Social Connections: Moderately Isolated (10/09/2021)   Social Connection and Isolation Panel [NHANES]    Frequency of Communication with Friends and Family: Twice a week    Frequency of Social Gatherings with Friends and Family: Once a week    Attends Religious Services: Never    Marine scientist or Organizations: No    Attends Music therapist: Never    Marital Status: Married    Tobacco Counseling Counseling given: Not Answered   Clinical Intake:  Pre-visit preparation completed: No  Pain : 0-10 Pain Score: 6  Pain Type: Chronic pain Pain Location: Shoulder Pain  Orientation: Left Pain Descriptors / Indicators: Aching Pain Onset: More than a month ago Pain Frequency: Intermittent Pain Relieving Factors: voltran gel  Pain Relieving Factors: voltran gel  Nutritional Status: BMI > 30  Obese Nutritional Risks: Unintentional weight gain, None Diabetes: Yes CBG  done?: No CBG resulted in Enter/ Edit results?: Yes Did pt. bring in CBG monitor from home?: No  How often do you need to have someone help you when you read instructions, pamphlets, or other written materials from your doctor or pharmacy?: 1 - Never  Diabetic?Nutrition Risk Assessment:  Has the patient had any N/V/D within the last 2 months?  No  Does the patient have any non-healing wounds?  No  Has the patient had any unintentional weight loss or weight gain?  Yes   Diabetes:  Is the patient diabetic?  Yes  If diabetic, was a CBG obtained today?  Yes  Did the patient bring in their glucometer from home?  No  How often do you monitor your CBG's? Every other day.   Financial Strains and Diabetes Management:  Are you having any financial strains with the device, your supplies or your medication? No .  Does the patient want to be seen by Chronic Care Management for management of their diabetes?  No  Would the patient like to be referred to a Nutritionist or for Diabetic Management?  No   Diabetic Exams:  Diabetic Eye EXAM: Retinopathy. We last got a copy from the eye doctor ([facility]) on 06/09/2020. 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 He is due for a f/u. I encouraged him to schedule an appointment. Once he is seen, I told him to have them send me a copy of the report.  Diabetic Foot Exam: Overdue, Pt has been advised about the importance in completing this exam. Pt is scheduled for diabetic foot exam on  .   Interpreter Needed?: No  Information entered by :: Laurel Dimmer, CMA   Activities of Daily Living    10/09/2021    3:16 PM  In your present  state of health, do you have any difficulty performing the following activities:  Hearing? 1  Comment Rt ear hearing loss, tinnutis  Vision? 0  Difficulty concentrating or making decisions? 1  Walking or climbing stairs? 1  Dressing or bathing? 0  Doing errands, shopping? 0    Patient Care Team: [name], DO as PCP - General (Family Medicine) Mariah Milling Tollie Pizza, MD as PCP - Cardiology (Cardiology) Tedd Sias Marlana Salvage, MD as Physician Assistant (Endocrinology)  Indicate any recent Medical Services you may have received from other than [facility] providers in the past year (date may be approximate).    No hospitalization in the past 12 months.  Assessment:   This is a routine wellness examination for Joel [name].  Hearing/Vision screen Reports Hearing loss. Pt followed by Dr. Ezzard Standing 11/2020 unchanged right ear sensorineural hearing loss and continued recommendation of hearing aid. Due for a Annual Eye Exam, denies vision issues. Retinopathy. Last got a copy from the eye doctor ([facility]) on 06/09/2020. 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 Pt reports that he had a reason eye exam. I will request a copy of his report.  Dietary issues and exercise activities discussed: Current Exercise Habits: The patient does not participate in regular exercise at present, Exercise limited by: orthopedic condition(s)   Goals Addressed   None    Depression Screen    10/09/2021    3:08 PM 08/02/2021   10:34 AM 10/11/2020    9:08 AM 10/03/2020    2:45 PM 07/25/2020    9:46 AM 06/04/2019   10:35 AM 04/21/2019    9:08 AM  PHQ 2/9 Scores  PHQ - 2 Score 4 5  6 6 6  0 4  PHQ- 9 Score 20 17 19 20 21  19     Fall Risk    10/09/2021    3:15 PM 08/02/2021   10:34 AM 10/11/2020    9:07 AM 10/03/2020    2:44 PM 06/04/2019   10:35 AM  Fall Risk   Falls in the past year? 1 0 0 1 0  Comment    lost balance   Number falls in past yr: 0 0 0 1 0  Injury with Fall? 1 0 0 0 0  Risk for fall  due to : Impaired balance/gait No Fall Risks  Impaired balance/gait;Medication side effect   Follow up Falls evaluation completed Falls evaluation completed Falls evaluation completed Falls evaluation completed;Education provided;Falls prevention discussed Falls evaluation completed    FALL RISK PREVENTION PERTAINING TO THE HOME:  Any stairs in or around the home? Yes  If so, are there any without handrails? No  Home free of loose throw rugs in walkways, pet beds, electrical cords, etc? Yes Adequate lighting in your home to reduce risk of falls? Yes   ASSISTIVE DEVICES UTILIZED TO PREVENT FALLS:  Life alert? No  Use of a cane, walker or w/c? No  Grab bars in the bathroom? Yes  Shower chair or bench in shower? No  Elevated toilet seat or a handicapped toilet? No   TIMED UP AND GO:  Was the test performed? Unable to perform, virtual appt.  Cognitive Function:        10/09/2021    3:17 PM  6CIT Screen  What Year? 0 points  What month? 0 points  What time? 0 points  Count back from 20 0 points  Months in reverse 0 points  Repeat phrase 8 points  Total Score 8 points    Immunizations Immunization History  Administered Date(s) Administered   Influenza,inj,Quad PF,6+ Mos 02/14/2017   Pneumococcal Polysaccharide-23 06/14/2014, 02/14/2017   Tdap 06/14/2014    TDAP status: Up to date  Flu Vaccine status: Due, Education has been provided regarding the importance of this vaccine. Advised may receive this vaccine at local pharmacy or Health Dept. Aware to provide a copy of the vaccination record if obtained from local pharmacy or Health Dept. Verbalized acceptance and understanding.  Pneumococcal vaccine status: Up to date  Covid-19 vaccine status: Declined, Education has been provided regarding the importance of this vaccine but patient still declined. Advised may receive this vaccine at local pharmacy or Health Dept.or vaccine clinic. Aware to provide a copy of the vaccination  record if obtained from local pharmacy or Health Dept. Verbalized acceptance and understanding.  Qualifies for Shingles Vaccine? No   Zostavax completed No   Shingrix Completed?: No.    Education has been provided regarding the importance of this vaccine. Patient has been advised to call insurance company to determine out of pocket expense if they have not yet received this vaccine. Advised may also receive vaccine at local pharmacy or Health Dept. Verbalized acceptance and understanding.  Screening Tests Health Maintenance  Topic Date Due   COVID-19 Vaccine (1) Never done   Hepatitis C Screening  Never done   FOOT EXAM  12/20/2018   OPHTHALMOLOGY EXAM  02/26/2019   HEMOGLOBIN A1C  11/10/2020   INFLUENZA VACCINE  09/11/2021   TETANUS/TDAP  06/13/2024   HIV Screening  Completed   HPV VACCINES  Aged Out    Health Maintenance  Health Maintenance Due  Topic Date Due   COVID-19 Vaccine (1) Never  done   Hepatitis C Screening  Never done   FOOT EXAM  12/20/2018   OPHTHALMOLOGY EXAM  02/26/2019   HEMOGLOBIN A1C  11/10/2020   INFLUENZA VACCINE  09/11/2021    Colon Rectal Screening: Does not qualify   Lung Cancer Screening: (Low Dose CT Chest recommended if Age 42-80 years, 30 pack-year currently smoking OR have quit w/in 15years.) does not qualify.   Lung Cancer Screening Referral: does not qualify   Additional Screening:  Hepatitis C Screening: does qualify  Vision Screening: Recommended annual ophthalmology exams for early detection of glaucoma and other disorders of the eye. Is the patient up to date with their annual eye exam?  Yes  Who is the provider or what is the name of the office in which the patient attends annual eye exams?  If pt is not established with a provider, would they like to be referred to a provider to establish care? No .   Dental Screening: Recommended annual dental exams for proper oral hygiene  Community Resource Referral / Chronic Care  Management: CRR required this visit?  No   CCM required this visit?  No      Plan:     I have personally reviewed and noted the following in the patient's chart:   Medical and social history Use of alcohol, tobacco or illicit drugs  Current medications and supplements including opioid prescriptions. Patient is not currently taking opioid prescriptions. Functional ability and status Nutritional status Physical activity Advanced directives List of other physicians Hospitalizations, surgeries, and ER visits in previous 12 months Vitals Screenings to include cognitive, depression, and falls Referrals and appointments  In addition, I have reviewed and discussed with patient certain preventive protocols, quality metrics, and best practice recommendations. A written personalized care plan for preventive services as well as general preventive health recommendations were provided to patient.    [name] , Thank you for taking time to come for your Medicare Wellness Visit. I appreciate your ongoing commitment to your health goals. Please review the following plan we discussed and let me know if I can assist you in the future.   These are the goals we discussed:  Goals      Exercise 3x per week (30 min per time)     Patient would like to work up to going to the gym 3x/week. Is currently going about once/week.      Patient Stated     10/03/2020, wants to weigh 200 pounds        This is a list of the screening recommended for you and due dates:  Health Maintenance  Topic Date Due   COVID-19 Vaccine (1) Never done   Hepatitis C Screening: USPSTF Recommendation to screen - Ages 45-79 yo.  Never done   Complete foot exam   12/20/2018   Eye exam for diabetics  02/26/2019   Hemoglobin A1C  11/10/2020   Flu Shot  09/11/2021   Tetanus Vaccine  06/13/2024   HIV Screening  Completed   HPV Vaccine  Aged [address], [place]   10/09/2021   Nurse Notes: Approximately 30  minute Non-face-to-face visit

## 2021-10-09 NOTE — Patient Instructions (Signed)
Health Maintenance, Male Adopting a healthy lifestyle and getting preventive care are important in promoting health and wellness. Ask your health care provider about: The right schedule for you to have regular tests and exams. Things you can do on your own to prevent diseases and keep yourself healthy. What should I know about diet, weight, and exercise? Eat a healthy diet  Eat a diet that includes plenty of vegetables, fruits, low-fat dairy products, and lean protein. Do not eat a lot of foods that are high in solid fats, added sugars, or sodium. Maintain a healthy weight Body mass index (BMI) is a measurement that can be used to identify possible weight problems. It estimates body fat based on height and weight. Your health care provider can help determine your BMI and help you achieve or maintain a healthy weight. Get regular exercise Get regular exercise. This is one of the most important things you can do for your health. Most adults should: Exercise for at least 150 minutes each week. The exercise should increase your heart rate and make you sweat (moderate-intensity exercise). Do strengthening exercises at least twice a week. This is in addition to the moderate-intensity exercise. Spend less time sitting. Even light physical activity can be beneficial. Watch cholesterol and blood lipids Have your blood tested for lipids and cholesterol at 40 years of age, then have this test every 5 years. You may need to have your cholesterol levels checked more often if: Your lipid or cholesterol levels are high. You are older than 40 years of age. You are at high risk for heart disease. What should I know about cancer screening? Many types of cancers can be detected early and may often be prevented. Depending on your health history and family history, you may need to have cancer screening at various ages. This may include screening for: Colorectal cancer. Prostate cancer. Skin cancer. Lung  cancer. What should I know about heart disease, diabetes, and high blood pressure? Blood pressure and heart disease High blood pressure causes heart disease and increases the risk of stroke. This is more likely to develop in people who have high blood pressure readings or are overweight. Talk with your health care provider about your target blood pressure readings. Have your blood pressure checked: Every 3-5 years if you are 18-39 years of age. Every year if you are 40 years old or older. If you are between the ages of 65 and 75 and are a current or former smoker, ask your health care provider if you should have a one-time screening for abdominal aortic aneurysm (AAA). Diabetes Have regular diabetes screenings. This checks your fasting blood sugar level. Have the screening done: Once every three years after age 45 if you are at a normal weight and have a low risk for diabetes. More often and at a younger age if you are overweight or have a high risk for diabetes. What should I know about preventing infection? Hepatitis B If you have a higher risk for hepatitis B, you should be screened for this virus. Talk with your health care provider to find out if you are at risk for hepatitis B infection. Hepatitis C Blood testing is recommended for: Everyone born from 1945 through 1965. Anyone with known risk factors for hepatitis C. Sexually transmitted infections (STIs) You should be screened each year for STIs, including gonorrhea and chlamydia, if: You are sexually active and are younger than 40 years of age. You are older than 40 years of age and your   health care provider tells you that you are at risk for this type of infection. Your sexual activity has changed since you were last screened, and you are at increased risk for chlamydia or gonorrhea. Ask your health care provider if you are at risk. Ask your health care provider about whether you are at high risk for HIV. Your health care provider  may recommend a prescription medicine to help prevent HIV infection. If you choose to take medicine to prevent HIV, you should first get tested for HIV. You should then be tested every 3 months for as long as you are taking the medicine. Follow these instructions at home: Alcohol use Do not drink alcohol if your health care provider tells you not to drink. If you drink alcohol: Limit how much you have to 0-2 drinks a day. Know how much alcohol is in your drink. In the [place] one drink equals one 12 oz bottle of beer (355 mL), one 5 oz glass of wine (148 mL), or one 1 oz glass of hard liquor (44 mL). Lifestyle Do not use any products that contain nicotine or tobacco. These products include cigarettes, chewing tobacco, and vaping devices, such as e-cigarettes. If you need help quitting, ask your health care provider. Do not use street drugs. Do not share needles. Ask your health care provider for help if you need support or information about quitting drugs. General instructions Schedule regular health, dental, and eye exams. Stay current with your vaccines. Tell your health care provider if: You often feel depressed. You have ever been abused or do not feel safe at home. Summary Adopting a healthy lifestyle and getting preventive care are important in promoting health and wellness. Follow your health care provider's instructions about healthy diet, exercising, and getting tested or screened for diseases. Follow your health care provider's instructions on monitoring your cholesterol and blood pressure. This information is not intended to replace advice given to you by your health care provider. Make sure you discuss any questions you have with your health care provider. Document Revised: 06/19/2020 Document Reviewed: 06/19/2020 Elsevier Patient Education  2023 Elsevier Inc.  

## 2021-10-09 NOTE — Addendum Note (Signed)
Addended by: Lonna Cobb on: 10/09/2021 03:39 PM   Modules accepted: Level of Service

## 2021-10-16 ENCOUNTER — Other Ambulatory Visit: Payer: Self-pay | Admitting: Family Medicine

## 2021-10-16 ENCOUNTER — Other Ambulatory Visit: Payer: Self-pay | Admitting: Cardiovascular Disease

## 2021-10-16 DIAGNOSIS — J3089 Other allergic rhinitis: Secondary | ICD-10-CM

## 2021-10-16 DIAGNOSIS — M7551 Bursitis of right shoulder: Secondary | ICD-10-CM

## 2021-10-16 DIAGNOSIS — G8929 Other chronic pain: Secondary | ICD-10-CM

## 2021-10-16 DIAGNOSIS — I110 Hypertensive heart disease with heart failure: Secondary | ICD-10-CM

## 2021-10-17 NOTE — Telephone Encounter (Signed)
Requested medication (s) are due for refill today: yes  Requested medication (s) are on the active medication list: yes  Last refill:  08/02/21  Future visit scheduled: yes  Notes to clinic:  Unable to refill per protocol, cannot delegate.      Requested Prescriptions  Pending Prescriptions Disp Refills   cyclobenzaprine (FLEXERIL) 10 MG tablet [Pharmacy Med Name: Cyclobenzaprine HCl 10 MG Oral Tablet] 30 tablet 0    Sig: TAKE 1 TABLET BY MOUTH TWICE DAILY AS NEEDED FOR MUSCLE SPASM     Not Delegated - Analgesics:  Muscle Relaxants Failed - 10/16/2021  7:18 AM      Failed - This refill cannot be delegated      Passed - Valid encounter within last 6 months    Recent Outpatient Visits           2 months ago Bursitis of right shoulder   [facility] [name], DO   1 year ago Seasonal allergic rhinitis due to other allergic trigger   [facility] [name], DO   1 year ago Hip swelling, right   [facility] [place], Netta Neat, DO   1 year ago Hordeolum internum left upper eyelid   [facility] [place], Netta Neat, DO   2 years ago Pinched nerve in neck   [facility] [place], Netta Neat, DO       Future Appointments             In 3 months Gollan, Tollie Pizza, MD [facility] [place]. [facility]            Signed Prescriptions Disp Refills   montelukast (SINGULAIR) 10 MG tablet 90 tablet 0    Sig: TAKE 1 TABLET BY MOUTH AT BEDTIME     Pulmonology:  Leukotriene Inhibitors Passed - 10/16/2021  7:18 AM      Passed - Valid encounter within last 12 months    Recent Outpatient Visits           2 months ago Bursitis of right shoulder   [facility] [place], Netta Neat, DO   1 year ago Seasonal allergic rhinitis due to other allergic trigger   [facility] [name], DO    1 year ago Hip swelling, right   [facility], Netta Neat, DO   1 year ago Hordeolum internum left upper eyelid   [facility] [place], Netta Neat, DO   2 years ago Pinched nerve in neck   [facility], Netta Neat, DO       Future Appointments             In 3 months Gollan, Tollie Pizza, MD [facility] Of [place]. [facility]

## 2021-10-17 NOTE — Telephone Encounter (Signed)
Requested Prescriptions  Pending Prescriptions Disp Refills  . montelukast (SINGULAIR) 10 MG tablet [Pharmacy Med Name: Montelukast Sodium 10 MG Oral Tablet] 90 tablet 0    Sig: TAKE 1 TABLET BY MOUTH AT BEDTIME     Pulmonology:  Leukotriene Inhibitors Passed - 10/16/2021  7:18 AM      Passed - Valid encounter within last 12 months    Recent Outpatient Visits          2 months ago Bursitis of right shoulder   [facility] [name], DO   1 year ago Seasonal allergic rhinitis due to other allergic trigger   [facility] [name], DO   1 year ago Hip swelling, right   [facility] [place], Netta Neat, DO   1 year ago Hordeolum internum left upper eyelid   [facility] [place], Netta Neat, DO   2 years ago Pinched nerve in neck   [facility] [place], Netta Neat, DO      Future Appointments            In 3 months Gollan, Tollie Pizza, MD [facility] Of [place]. [facility]           . cyclobenzaprine (FLEXERIL) 10 MG tablet [Pharmacy Med Name: Cyclobenzaprine HCl 10 MG Oral Tablet] 30 tablet 0    Sig: TAKE 1 TABLET BY MOUTH TWICE DAILY AS NEEDED FOR MUSCLE SPASM     Not Delegated - Analgesics:  Muscle Relaxants Failed - 10/16/2021  7:18 AM      Failed - This refill cannot be delegated      Passed - Valid encounter within last 6 months    Recent Outpatient Visits          2 months ago Bursitis of right shoulder   [facility] [name], DO   1 year ago Seasonal allergic rhinitis due to other allergic trigger   [facility] [name], DO   1 year ago Hip swelling, right   [facility] [place], Netta Neat, DO   1 year ago Hordeolum internum left upper eyelid   [facility] [place], Netta Neat, DO   2 years ago Pinched nerve in neck    [facility] [place], Netta Neat, DO      Future Appointments            In 3 months Gollan, Tollie Pizza, MD [facility] Of [place]. [facility]

## 2021-10-25 ENCOUNTER — Ambulatory Visit: Payer: Medicare Other | Admitting: Dermatology

## 2021-11-05 ENCOUNTER — Encounter: Payer: Self-pay | Admitting: Family Medicine

## 2021-11-05 DIAGNOSIS — R112 Nausea with vomiting, unspecified: Secondary | ICD-10-CM

## 2021-11-06 MED ORDER — ONDANSETRON 4 MG PO TBDP: 4.0000 mg | ORAL_TABLET | Freq: Three times a day (TID) | ORAL | 0 refills | Status: DC | PRN

## 2021-11-07 ENCOUNTER — Other Ambulatory Visit: Payer: Self-pay

## 2021-11-07 ENCOUNTER — Emergency Department: Payer: Medicare Other

## 2021-11-07 ENCOUNTER — Encounter: Payer: Self-pay | Admitting: Emergency Medicine

## 2021-11-07 ENCOUNTER — Inpatient Hospital Stay
Admission: EM | Admit: 2021-11-07 | Discharge: 2021-11-09 | DRG: 853 | Disposition: A | Discharge status: Home / Self Care | Payer: Medicare Other | Attending: Family Medicine | Admitting: Family Medicine

## 2021-11-07 DIAGNOSIS — F32A Depression, unspecified: Secondary | ICD-10-CM | POA: Diagnosis present

## 2021-11-07 DIAGNOSIS — R140 Abdominal distension (gaseous): Secondary | ICD-10-CM | POA: Diagnosis not present

## 2021-11-07 DIAGNOSIS — E876 Hypokalemia: Secondary | ICD-10-CM

## 2021-11-07 DIAGNOSIS — E739 Lactose intolerance, unspecified: Secondary | ICD-10-CM | POA: Diagnosis present

## 2021-11-07 DIAGNOSIS — E860 Dehydration: Secondary | ICD-10-CM | POA: Diagnosis present

## 2021-11-07 DIAGNOSIS — N1831 Chronic kidney disease, stage 3a: Secondary | ICD-10-CM | POA: Diagnosis present

## 2021-11-07 DIAGNOSIS — Z823 Family history of stroke: Secondary | ICD-10-CM | POA: Diagnosis not present

## 2021-11-07 DIAGNOSIS — Z7902 Long term (current) use of antithrombotics/antiplatelets: Secondary | ICD-10-CM

## 2021-11-07 DIAGNOSIS — Z6838 Body mass index (BMI) 38.0-38.9, adult: Secondary | ICD-10-CM

## 2021-11-07 DIAGNOSIS — R112 Nausea with vomiting, unspecified: Secondary | ICD-10-CM

## 2021-11-07 DIAGNOSIS — R6520 Severe sepsis without septic shock: Secondary | ICD-10-CM | POA: Diagnosis present

## 2021-11-07 DIAGNOSIS — Z833 Family history of diabetes mellitus: Secondary | ICD-10-CM

## 2021-11-07 DIAGNOSIS — R110 Nausea: Principal | ICD-10-CM

## 2021-11-07 DIAGNOSIS — I69398 Other sequelae of cerebral infarction: Secondary | ICD-10-CM

## 2021-11-07 DIAGNOSIS — I428 Other cardiomyopathies: Secondary | ICD-10-CM | POA: Diagnosis present

## 2021-11-07 DIAGNOSIS — K3580 Unspecified acute appendicitis: Secondary | ICD-10-CM | POA: Diagnosis present

## 2021-11-07 DIAGNOSIS — E785 Hyperlipidemia, unspecified: Secondary | ICD-10-CM | POA: Diagnosis present

## 2021-11-07 DIAGNOSIS — R109 Unspecified abdominal pain: Secondary | ICD-10-CM | POA: Diagnosis present

## 2021-11-07 DIAGNOSIS — N170 Acute kidney failure with tubular necrosis: Secondary | ICD-10-CM | POA: Diagnosis present

## 2021-11-07 DIAGNOSIS — G4733 Obstructive sleep apnea (adult) (pediatric): Secondary | ICD-10-CM | POA: Diagnosis present

## 2021-11-07 DIAGNOSIS — E669 Obesity, unspecified: Secondary | ICD-10-CM | POA: Diagnosis present

## 2021-11-07 DIAGNOSIS — E1165 Type 2 diabetes mellitus with hyperglycemia: Secondary | ICD-10-CM | POA: Diagnosis present

## 2021-11-07 DIAGNOSIS — K219 Gastro-esophageal reflux disease without esophagitis: Secondary | ICD-10-CM | POA: Diagnosis present

## 2021-11-07 DIAGNOSIS — Z8349 Family history of other endocrine, nutritional and metabolic diseases: Secondary | ICD-10-CM

## 2021-11-07 DIAGNOSIS — I130 Hypertensive heart and chronic kidney disease with heart failure and stage 1 through stage 4 chronic kidney disease, or unspecified chronic kidney disease: Secondary | ICD-10-CM | POA: Diagnosis present

## 2021-11-07 DIAGNOSIS — I5042 Chronic combined systolic (congestive) and diastolic (congestive) heart failure: Secondary | ICD-10-CM | POA: Diagnosis present

## 2021-11-07 DIAGNOSIS — Z79899 Other long term (current) drug therapy: Secondary | ICD-10-CM

## 2021-11-07 DIAGNOSIS — E1122 Type 2 diabetes mellitus with diabetic chronic kidney disease: Secondary | ICD-10-CM | POA: Diagnosis present

## 2021-11-07 DIAGNOSIS — I10 Essential (primary) hypertension: Secondary | ICD-10-CM | POA: Diagnosis present

## 2021-11-07 DIAGNOSIS — I5022 Chronic systolic (congestive) heart failure: Secondary | ICD-10-CM | POA: Diagnosis present

## 2021-11-07 DIAGNOSIS — Z7984 Long term (current) use of oral hypoglycemic drugs: Secondary | ICD-10-CM

## 2021-11-07 DIAGNOSIS — H918X1 Other specified hearing loss, right ear: Secondary | ICD-10-CM | POA: Diagnosis present

## 2021-11-07 DIAGNOSIS — N179 Acute kidney failure, unspecified: Secondary | ICD-10-CM

## 2021-11-07 DIAGNOSIS — A419 Sepsis, unspecified organism: Principal | ICD-10-CM | POA: Diagnosis present

## 2021-11-07 DIAGNOSIS — Z800 Family history of malignant neoplasm of digestive organs: Secondary | ICD-10-CM

## 2021-11-07 DIAGNOSIS — I420 Dilated cardiomyopathy: Secondary | ICD-10-CM | POA: Diagnosis present

## 2021-11-07 DIAGNOSIS — Z8249 Family history of ischemic heart disease and other diseases of the circulatory system: Secondary | ICD-10-CM

## 2021-11-07 DIAGNOSIS — G4730 Sleep apnea, unspecified: Secondary | ICD-10-CM

## 2021-11-07 DIAGNOSIS — F419 Anxiety disorder, unspecified: Secondary | ICD-10-CM | POA: Diagnosis present

## 2021-11-07 DIAGNOSIS — Z8673 Personal history of transient ischemic attack (TIA), and cerebral infarction without residual deficits: Secondary | ICD-10-CM

## 2021-11-07 DIAGNOSIS — E1121 Type 2 diabetes mellitus with diabetic nephropathy: Secondary | ICD-10-CM | POA: Diagnosis present

## 2021-11-07 LAB — COMPREHENSIVE METABOLIC PANEL
ALT: 17 U/L (ref 0–44)
AST: 21 U/L (ref 15–41)
Albumin: 4 g/dL (ref 3.5–5.0)
Alkaline Phosphatase: 68 U/L (ref 38–126)
Anion gap: 16 — ABNORMAL HIGH (ref 5–15)
BUN: 19 mg/dL (ref 6–20)
CO2: 28 mmol/L (ref 22–32)
Calcium: 9.8 mg/dL (ref 8.9–10.3)
Chloride: 97 mmol/L — ABNORMAL LOW (ref 98–111)
Creatinine, Ser: 2.43 mg/dL — ABNORMAL HIGH (ref 0.61–1.24)
GFR, Estimated: 34 mL/min — ABNORMAL LOW (ref >60)
Glucose, Bld: 117 mg/dL — ABNORMAL HIGH (ref 70–99)
Potassium: 2.9 mmol/L — ABNORMAL LOW (ref 3.5–5.1)
Sodium: 141 mmol/L (ref 135–145)
Total Bilirubin: 1.4 mg/dL — ABNORMAL HIGH (ref 0.3–1.2)
Total Protein: 8.7 g/dL — ABNORMAL HIGH (ref 6.5–8.1)

## 2021-11-07 LAB — BETA-HYDROXYBUTYRIC ACID: Beta-Hydroxybutyric Acid: 4.28 mmol/L — ABNORMAL HIGH (ref 0.05–0.27)

## 2021-11-07 LAB — URINALYSIS, ROUTINE W REFLEX MICROSCOPIC
Bilirubin Urine: NEGATIVE
Glucose, UA: >=500 mg/dL — AB
Ketones, ur: 80 mg/dL — AB
Leukocytes,Ua: NEGATIVE
Nitrite: NEGATIVE
Protein, ur: >=300 mg/dL — AB
Specific Gravity, Urine: 1.031 — ABNORMAL HIGH (ref 1.005–1.030)
Squamous Epithelial / HPF: NONE SEEN (ref 0–5)
pH: 6 (ref 5.0–8.0)

## 2021-11-07 LAB — LIPASE, BLOOD: Lipase: 40 U/L (ref 11–51)

## 2021-11-07 LAB — BASIC METABOLIC PANEL
Anion gap: 13 (ref 5–15)
BUN: 17 mg/dL (ref 6–20)
CO2: 27 mmol/L (ref 22–32)
Calcium: 8.5 mg/dL — ABNORMAL LOW (ref 8.9–10.3)
Chloride: 101 mmol/L (ref 98–111)
Creatinine, Ser: 2.07 mg/dL — ABNORMAL HIGH (ref 0.61–1.24)
GFR, Estimated: 41 mL/min — ABNORMAL LOW (ref >60)
Glucose, Bld: 95 mg/dL (ref 70–99)
Potassium: 2.8 mmol/L — ABNORMAL LOW (ref 3.5–5.1)
Sodium: 141 mmol/L (ref 135–145)

## 2021-11-07 LAB — CBC
HCT: 52.2 % — ABNORMAL HIGH (ref 39.0–52.0)
Hemoglobin: 17.1 g/dL — ABNORMAL HIGH (ref 13.0–17.0)
MCH: 26.1 pg (ref 26.0–34.0)
MCHC: 32.8 g/dL (ref 30.0–36.0)
MCV: 79.7 fL — ABNORMAL LOW (ref 80.0–100.0)
Platelets: 238 10*3/uL (ref 150–400)
RBC: 6.55 MIL/uL — ABNORMAL HIGH (ref 4.22–5.81)
RDW: 13.9 % (ref 11.5–15.5)
WBC: 6 10*3/uL (ref 4.0–10.5)
nRBC: 0 % (ref 0.0–0.2)

## 2021-11-07 LAB — LACTIC ACID, PLASMA: Lactic Acid, Venous: 1.6 mmol/L (ref 0.5–1.9)

## 2021-11-07 LAB — CBG MONITORING, ED: Glucose-Capillary: 124 mg/dL — ABNORMAL HIGH (ref 70–99)

## 2021-11-07 LAB — GLUCOSE, CAPILLARY: Glucose-Capillary: 91 mg/dL (ref 70–99)

## 2021-11-07 MED ORDER — ONDANSETRON HCL 4 MG PO TABS: 4.0000 mg | ORAL_TABLET | Freq: Four times a day (QID) | ORAL | Status: DC | PRN

## 2021-11-07 MED ORDER — PIPERACILLIN-TAZOBACTAM 3.375 G IVPB 30 MIN
3.3750 g | Freq: Once | INTRAVENOUS | Status: AC
  Administered 2021-11-07: 3.375 g via INTRAVENOUS
  Filled 2021-11-07: qty 50

## 2021-11-07 MED ORDER — ONDANSETRON HCL 4 MG/2ML IJ SOLN: 4.0000 mg | Freq: Four times a day (QID) | INTRAMUSCULAR | Status: DC | PRN

## 2021-11-07 MED ORDER — POTASSIUM CHLORIDE 10 MEQ/100ML IV SOLN
10.0000 meq | INTRAVENOUS | Status: AC
  Administered 2021-11-07 – 2021-11-08 (×3): 10 meq via INTRAVENOUS
  Filled 2021-11-07 (×3): qty 100

## 2021-11-07 MED ORDER — IOHEXOL 350 MG/ML SOLN
75.0000 mL | Freq: Once | INTRAVENOUS | Status: AC | PRN
  Administered 2021-11-07: 75 mL via INTRAVENOUS

## 2021-11-07 MED ORDER — MORPHINE SULFATE (PF) 4 MG/ML IV SOLN
4.0000 mg | Freq: Once | INTRAVENOUS | Status: AC
  Administered 2021-11-07: 4 mg via INTRAVENOUS
  Filled 2021-11-07: qty 1

## 2021-11-07 MED ORDER — INSULIN ASPART 100 UNIT/ML IJ SOLN
0.0000 [IU] | INTRAMUSCULAR | Status: DC
  Administered 2021-11-09: 2 [IU] via SUBCUTANEOUS
  Administered 2021-11-09 (×2): 3 [IU] via SUBCUTANEOUS
  Administered 2021-11-09: 2 [IU] via SUBCUTANEOUS
  Filled 2021-11-07 (×4): qty 1

## 2021-11-07 MED ORDER — ACETAMINOPHEN 650 MG RE SUPP: 650.0000 mg | Freq: Four times a day (QID) | RECTAL | Status: DC | PRN

## 2021-11-07 MED ORDER — ACETAMINOPHEN 325 MG PO TABS: 650.0000 mg | ORAL_TABLET | Freq: Four times a day (QID) | ORAL | Status: DC | PRN

## 2021-11-07 MED ORDER — INSULIN DETEMIR 100 UNIT/ML ~~LOC~~ SOLN
15.0000 [IU] | Freq: Every day | SUBCUTANEOUS | Status: DC
  Administered 2021-11-07: 15 [IU] via SUBCUTANEOUS
  Filled 2021-11-07 (×2): qty 0.15

## 2021-11-07 MED ORDER — PIPERACILLIN-TAZOBACTAM 3.375 G IVPB
3.3750 g | Freq: Three times a day (TID) | INTRAVENOUS | Status: DC
  Administered 2021-11-08 – 2021-11-09 (×4): 3.375 g via INTRAVENOUS
  Filled 2021-11-07 (×4): qty 50

## 2021-11-07 MED ORDER — PANTOPRAZOLE SODIUM 40 MG IV SOLR
40.0000 mg | INTRAVENOUS | Status: DC
  Administered 2021-11-07 – 2021-11-09 (×2): 40 mg via INTRAVENOUS
  Filled 2021-11-07 (×2): qty 10

## 2021-11-07 MED ORDER — LACTATED RINGERS IV SOLN: INTRAVENOUS | Status: DC

## 2021-11-07 MED ORDER — LACTATED RINGERS IV BOLUS
1000.0000 mL | Freq: Once | INTRAVENOUS | Status: AC
  Administered 2021-11-07: 1000 mL via INTRAVENOUS

## 2021-11-07 MED ORDER — ONDANSETRON HCL 4 MG/2ML IJ SOLN
4.0000 mg | Freq: Once | INTRAMUSCULAR | Status: AC
  Administered 2021-11-07: 4 mg via INTRAVENOUS
  Filled 2021-11-07: qty 2

## 2021-11-07 MED ORDER — SODIUM CHLORIDE 0.9 % IV BOLUS
1000.0000 mL | Freq: Once | INTRAVENOUS | Status: AC
  Administered 2021-11-07: 1000 mL via INTRAVENOUS

## 2021-11-07 MED ORDER — POTASSIUM CHLORIDE 10 MEQ/100ML IV SOLN
10.0000 meq | Freq: Once | INTRAVENOUS | Status: AC
  Administered 2021-11-07: 10 meq via INTRAVENOUS

## 2021-11-07 MED ORDER — MORPHINE SULFATE (PF) 2 MG/ML IV SOLN
2.0000 mg | INTRAVENOUS | Status: DC | PRN
  Administered 2021-11-08: 2 mg via INTRAVENOUS
  Filled 2021-11-07: qty 1

## 2021-11-07 MED ORDER — PANTOPRAZOLE SODIUM 40 MG IV SOLR
40.0000 mg | INTRAVENOUS | Status: DC
  Filled 2021-11-07: qty 10

## 2021-11-07 MED ORDER — ALUM & MAG HYDROXIDE-SIMETH 200-200-20 MG/5ML PO SUSP
30.0000 mL | Freq: Once | ORAL | Status: AC
  Administered 2021-11-07: 30 mL via ORAL
  Filled 2021-11-07: qty 30

## 2021-11-07 MED ORDER — SIMETHICONE 40 MG/0.6ML PO SUSP (UNIT DOSE)
80.0000 mg | Freq: Once | ORAL | Status: AC
  Administered 2021-11-08: 80 mg via ORAL
  Filled 2021-11-07: qty 1.2
  Filled 2021-11-07: qty 30

## 2021-11-07 MED ORDER — HYDRALAZINE HCL 20 MG/ML IJ SOLN: 5.0000 mg | INTRAMUSCULAR | Status: DC | PRN

## 2021-11-07 NOTE — Assessment & Plan Note (Signed)
Blood pressure controlled Hydralazine IV as needed to keep systolic under 562 while n.p.o.

## 2021-11-07 NOTE — ED Provider Notes (Signed)
[facility] Provider Note    Event Date/Time   First MD Initiated Contact with Patient 11/07/21 1805     (approximate)   History   Nausea   HPI  Joel [name] is a 40 y.o. male with a past history of diabetes hypertension hyperlipidemia and obstructive sleep apnea who complains of nausea vomiting and abdominal pain.  Patient reports has been going on for about a week.  He also says he has not had his medicines for about a week.      Physical Exam   Triage Vital Signs: ED Triage Vitals  Enc Vitals Group     BP 11/07/21 1534 (!) 151/116     Pulse Rate 11/07/21 1534 (!) 136     Resp 11/07/21 1534 20     Temp 11/07/21 1534 98.5 F (36.9 C)     Temp Source 11/07/21 1534 Oral     SpO2 11/07/21 1534 100 %     Weight 11/07/21 1540 245 lb (111.1 kg)     Height 11/07/21 1540 5\' 7"  (1.702 m)     Head Circumference --      Peak Flow --      Pain Score 11/07/21 1539 4     Pain Loc --      Pain Edu? --      Excl. in GC? --     Most recent vital signs: Vitals:   11/07/21 2130 11/07/21 2248  BP: (!) 121/91   Pulse: (!) 108   Resp: (!) 21   Temp:  98.6 F (37 C)  SpO2: 99%      General: Awake, alert looks sick CV:  Good peripheral perfusion.  Heart regular rate and rhythm no audible murmurs somewhat tacky Resp:  Normal effort.  Lungs are clear Abd:  No distention.  Soft diffusely tender to palpation percussion    ED Results / Procedures / Treatments   Labs (all labs ordered are listed, but only abnormal results are displayed) Labs Reviewed  COMPREHENSIVE METABOLIC PANEL - Abnormal; Notable for the following components:      Result Value   Potassium 2.9 (*)    Chloride 97 (*)    Glucose, Bld 117 (*)    Creatinine, Ser 2.43 (*)    Total Protein 8.7 (*)    Total Bilirubin 1.4 (*)    GFR, Estimated 34 (*)    Anion gap 16 (*)    All other components within normal limits  CBC - Abnormal; Notable for the following components:   RBC 6.55  (*)    Hemoglobin 17.1 (*)    HCT 52.2 (*)    MCV 79.7 (*)    All other components within normal limits  URINALYSIS, ROUTINE W REFLEX MICROSCOPIC - Abnormal; Notable for the following components:   Color, Urine YELLOW (*)    APPearance CLEAR (*)    Specific Gravity, Urine 1.031 (*)    Glucose, UA >=500 (*)    Hgb urine dipstick SMALL (*)    Ketones, ur 80 (*)    Protein, ur >=300 (*)    Bacteria, UA RARE (*)    All other components within normal limits  BETA-HYDROXYBUTYRIC ACID - Abnormal; Notable for the following components:   Beta-Hydroxybutyric Acid 4.28 (*)    All other components within normal limits  BASIC METABOLIC PANEL - Abnormal; Notable for the following components:   Potassium 2.8 (*)    Creatinine, Ser 2.07 (*)    Calcium 8.5 (*)  GFR, Estimated 41 (*)    All other components within normal limits  CBG MONITORING, ED - Abnormal; Notable for the following components:   Glucose-Capillary 124 (*)    All other components within normal limits  LIPASE, BLOOD  LACTIC ACID, PLASMA  HEMOGLOBIN A1C  PROTIME-INR  CORTISOL-AM, BLOOD  PROCALCITONIN  BASIC METABOLIC PANEL  CBC  HIV ANTIBODY (ROUTINE TESTING W REFLEX)  LACTIC ACID, PLASMA  LACTIC ACID, PLASMA  CBG MONITORING, ED     EKG  EKG read and interpreted by me shows sinus tach rate of 155 extreme right axis decreased R wave progression.  No acute ST-T wave changes are seen.  Axis is changed from previously but the rest of EKG looks similar to prior.   RADIOLOGY  CT read by radiology reviewed and interpreted by me shows acute appendicitis  PROCEDURES:  Critical Care performed:   Procedures   MEDICATIONS ORDERED IN ED: Medications  hydrALAZINE (APRESOLINE) injection 5 mg (has no administration in time range)  insulin detemir (LEVEMIR) injection 15 Units (has no administration in time range)  insulin aspart (novoLOG) injection 0-15 Units (has no administration in time range)  lactated ringers  infusion ( Intravenous New Bag/Given 11/07/21 2312)  acetaminophen (TYLENOL) tablet 650 mg (has no administration in time range)    Or  acetaminophen (TYLENOL) suppository 650 mg (has no administration in time range)  ondansetron (ZOFRAN) tablet 4 mg (has no administration in time range)    Or  ondansetron (ZOFRAN) injection 4 mg (has no administration in time range)  ondansetron (ZOFRAN) injection 4 mg (has no administration in time range)  piperacillin-tazobactam (ZOSYN) IVPB 3.375 g (has no administration in time range)  potassium chloride 10 mEq in 100 mL IVPB (10 mEq Intravenous New Bag/Given 11/07/21 2315)  pantoprazole (PROTONIX) injection 40 mg (has no administration in time range)  morphine (PF) 2 MG/ML injection 2 mg (has no administration in time range)  simethicone (MYLICON) 40 HB/7.1IR suspension 80 mg (has no administration in time range)  pantoprazole (PROTONIX) injection 40 mg (has no administration in time range)  ondansetron (ZOFRAN) injection 4 mg (4 mg Intravenous Given 11/07/21 1837)  morphine (PF) 4 MG/ML injection 4 mg (4 mg Intravenous Given 11/07/21 1836)  sodium chloride 0.9 % bolus 1,000 mL (0 mLs Intravenous Stopped 11/07/21 2109)  sodium chloride 0.9 % bolus 1,000 mL (0 mLs Intravenous Stopped 11/07/21 2109)  iohexol (OMNIPAQUE) 350 MG/ML injection 75 mL (75 mLs Intravenous Contrast Given 11/07/21 1840)  piperacillin-tazobactam (ZOSYN) IVPB 3.375 g (0 g Intravenous Stopped 11/07/21 2008)  potassium chloride 10 mEq in 100 mL IVPB (0 mEq Intravenous Stopped 11/07/21 2046)  lactated ringers bolus 1,000 mL (0 mLs Intravenous Stopped 11/07/21 2109)  alum & mag hydroxide-simeth (MAALOX/MYLANTA) 200-200-20 MG/5ML suspension 30 mL (30 mLs Oral Given 11/07/21 2308)     IMPRESSION / MDM / ASSESSMENT AND PLAN / ED COURSE  I reviewed the triage vital signs and the nursing notes. Patient with hypokalemia and AKI from vomiting likely due to his acute appendicitis.  His white count is  normal.  After fluids he looks considerably better and more comfortable.  His GFR has gone up slightly.  We will continue to replete his potassium and fluids.  I spoke with the surgeon who asked me to get the patient admitted medically to get him ready for surgery.  We will give him antibiotics in the meantime.    Patient's presentation is most consistent with acute presentation with potential threat to life  or bodily function.  The patient is on the cardiac monitor to evaluate for evidence of arrhythmia and/or significant heart rate changes.  None have been seen    FINAL CLINICAL IMPRESSION(S) / ED DIAGNOSES   Final diagnoses:  Nausea  Nausea and vomiting, unspecified vomiting type  Acute appendicitis, unspecified acute appendicitis type  Hypokalemia  AKI (acute kidney injury) (HCC)     Rx / DC Orders   ED Discharge Orders     None        Note:  This document was prepared using Dragon voice recognition software and may include unintentional dictation errors.   Arnaldo Natal, MD 11/07/21 [phone]

## 2021-11-07 NOTE — ED Provider Triage Note (Signed)
Emergency Medicine Provider Triage Evaluation Note  Joel [name], a 41 y.o. male  was evaluated in triage.  Pt complains of persistent nausea vomiting and lower abdominal pain.  Patient reports week of symptoms.  He was evaluated local urgent care yesterday and given medication for nausea vomiting.  Patient with a history of diabetes, CHF, CKD, hypertension presents to the ED denies any fevers, chills, or sweats.  Sent to the ED via EMS for evaluation of his symptoms.  Review of Systems  Positive: Nausea, vomiting, weakness Negative: FCS  Physical Exam  BP (!) 151/116   Pulse (!) 136   Temp 98.5 F (36.9 C) (Oral)   Resp 20   Ht 5\' 7"  (1.702 m)   Wt 111.1 kg   SpO2 100%   BMI 38.37 kg/m  Gen:   Awake, no distress  NAD Resp:  Normal effort CTA MSK:   Moves extremities without difficulty  ABD:  Soft, nontender  Medical Decision Making  Medically screening exam initiated at 3:45 PM.  Appropriate orders placed.  Joel [name] was informed that the remainder of the evaluation will be completed by another provider, this initial triage assessment does not replace that evaluation, and the importance of remaining in the ED until their evaluation is complete.  Patient to the ED for evaluation of 1 week of persistent nausea and vomiting.   Melvenia Needles, PA-C 11/07/21 [phone]

## 2021-11-07 NOTE — Assessment & Plan Note (Signed)
Secondary to ATN from dehydration from intractable vomiting related to acute appendicitis Patient received 3 L fluid bolus in the ED Continue IV hydration, monitoring for overload Avoid nephrotoxins

## 2021-11-07 NOTE — Assessment & Plan Note (Signed)
CPAP nightly

## 2021-11-07 NOTE — Assessment & Plan Note (Signed)
Will hold antiplatelet and statin tonight while n.p.o. for surgery in the a.m.

## 2021-11-07 NOTE — Plan of Care (Signed)
  Problem: Education: Goal: Ability to describe self-care measures that may prevent or decrease complications (Diabetes Survival Skills Education) will improve Outcome: Progressing   

## 2021-11-07 NOTE — H&P (Signed)
History and Physical    Patient: Joel [name] [id number] DOB: [birth year] DOA: 11/07/2021 DOS: the patient was seen and examined on 11/07/2021 PCP: [name], DO  Patient coming from: Home  Chief Complaint:  Chief Complaint  Patient presents with   Nausea    HPI: Joel [name] is a 41 y.o. male with medical history significant for Systolic CHF secondary to nonischemic cardiomyopathy from hypertensive heart disease, with improved EF 55 to 60% with G1 DD 07/2019, history of CVA 2021, CKD 3, poorly controlled DM with last A1c 11.3, sleep apnea on CPAP, who presents to the ED with a 1 week history of nonbloody nonbilious vomiting and lower abdominal pain, to where he was unable to take his medications for the whole week.  He denies fever or chills. ED courseAnd data review: BP 151/116 with pulse 136 and respirations 24 temperature 98.5.  WBC of 6 and hemoglobin 17.1, potassium 2.9, creatinine 2.43 up from baseline of 1.7 total bili 1.4, anion gap 16.  Lipase and LFTs WNL.  Urinalysis with greater than 500 glucose and 80 ketones.  EKG showing sinus tachycardia at 155 with no acute ST-T wave changes CT abdomen and pelvis shows the following IMPRESSION: 1. CT findings consistent with acute appendicitis. 2. Significant age advanced atherosclerotic calcifications involving the aorta and iliac arteries and branch vessels.  The ED provider spoke with surgeon, Dr. Milas Gain who would like patient admitted to the hospitalist service for stabilization of his medical problems prior to surgery which he anticipates will be in the a.m.  Patient was administered 3 L of IV fluids in the ED, IV morphine Zofran and started on Zosyn.  Hospitalist consulted for admission.   Review of Systems: As mentioned in the history of present illness. All other systems reviewed and are negative.  Past Medical History:  Diagnosis Date   Anxiety    Chest pain    Chronic diastolic CHF (congestive heart  failure) (HCC)    Constipation    Depression    GERD (gastroesophageal reflux disease)    Hyperlipidemia    Hypertension    Hypertensive heart disease    Lactose intolerance    Noncompliance with medications    Obesity    Palpitations    Renal disorder    Shortness of breath on exertion    Sleep apnea    Stroke [facility])    Systolic dysfunction    a. TTE 06/2014: EF 45-50%, normal wall motion, mild MR, mild AI   Past Surgical History:  Procedure Laterality Date   NO PAST SURGERIES     Social History:  reports that he has never smoked. He has never used smokeless tobacco. He reports that he does not currently use alcohol. He reports that he does not use drugs.  Allergies  Allergen Reactions   Bidil [Isosorb Dinitrate-Hydralazine] Other (See Comments)    Severe headache   Nitroglycerin Other (See Comments)    Causes severe headaches    Family History  Problem Relation Age of Onset   Cancer Mother    Hypertension Mother    Alcoholism Father    Hyperlipidemia Father    Diabetes Father    CAD Father        a. stenting in his early 49s   Heart disease Father    Stroke Father    Heart attack Father    Diabetes Maternal Grandmother    Diabetes Maternal Grandfather    CAD Paternal Grandmother    CAD Paternal  Grandfather    Colon cancer Paternal Grandfather    Diabetes Paternal Uncle     Prior to Admission medications   Medication Sig Start Date End Date Taking? Authorizing Provider  Alirocumab (PRALUENT) 75 MG/ML SOAJ Inject 1 Pen into the skin every 14 (fourteen) days. 09/20/21   Minna Merritts, MD  amLODipine (NORVASC) 10 MG tablet Take 1 tablet by mouth once daily 10/16/21   Minna Merritts, MD  carvedilol (COREG) 25 MG tablet Take 1 tablet (25 mg total) by mouth 2 (two) times daily with a meal. 10/10/20   Gollan, Kathlene November, MD  clopidogrel (PLAVIX) 75 MG tablet Take 1 tablet (75 mg total) by mouth daily. 10/10/20   Minna Merritts, MD  cyclobenzaprine (FLEXERIL) 10  MG tablet TAKE 1 TABLET BY MOUTH TWICE DAILY AS NEEDED FOR MUSCLE SPASM 10/17/21   Parks Ranger, Devonne Doughty, DO  escitalopram (LEXAPRO) 10 MG tablet Take 15 mg by mouth daily. 12/08/20   [provider]  ezetimibe (ZETIA) 10 MG tablet Take 1 tablet (10 mg total) by mouth daily. 09/20/21   Minna Merritts, MD  furosemide (LASIX) 20 MG tablet Take 1 tablet (20 mg total) by mouth daily as needed. For shortness of breath 01/15/21   Minna Merritts, MD  montelukast (SINGULAIR) 10 MG tablet TAKE 1 TABLET BY MOUTH AT BEDTIME 10/17/21   Karamalegos, Devonne Doughty, DO  omeprazole (PRILOSEC) 20 MG capsule Take 20 mg by mouth daily.    [provider]  ondansetron (ZOFRAN-ODT) 4 MG disintegrating tablet Take 1 tablet (4 mg total) by mouth every 8 (eight) hours as needed for nausea or vomiting. 11/06/21   Karamalegos, Devonne Doughty, DO  OZEMPIC, 0.25 OR 0.5 MG/DOSE, 2 MG/1.5ML SOPN Inject 2 mg into the skin once a week. 11/14/20   [provider]  rosuvastatin (CRESTOR) 40 MG tablet Take 1 tablet (40 mg total) by mouth daily. 09/20/21   Minna Merritts, MD  topiramate (TOPAMAX) 100 MG tablet Take 100 mg by mouth at bedtime. 07/04/21   [provider]  TRESIBA FLEXTOUCH 100 UNIT/ML FlexTouch Pen Inject 22 Units into the skin daily. 08/09/21   [provider]    Physical Exam: Vitals:   11/07/21 1540 11/07/21 1753 11/07/21 1754 11/07/21 1800  BP:   (!) 136/102 (!) 128/99  Pulse:   (!) 116 (!) 115  Resp:  (!) 24    Temp:      TempSrc:      SpO2:   100% 99%  Weight: 111.1 kg     Height: 5\' 7"  (1.702 m)      Physical Exam Vitals and nursing note reviewed.  Constitutional:      General: He is not in acute distress. HENT:     Head: Normocephalic and atraumatic.  Cardiovascular:     Rate and Rhythm: Normal rate and regular rhythm.     Heart sounds: Normal heart sounds.  Pulmonary:     Effort: Pulmonary effort is normal.     Breath sounds: Normal breath sounds.   Abdominal:     Palpations: Abdomen is soft.     Tenderness: There is generalized abdominal tenderness.  Neurological:     Mental Status: Mental status is at baseline.     Labs on Admission: I have personally reviewed following labs and imaging studies  CBC: Recent Labs  Lab 11/07/21 1547  WBC 6.0  HGB 17.1*  HCT 52.2*  MCV 79.7*  PLT 99991111   Basic Metabolic  Panel: Recent Labs  Lab 11/07/21 1547  NA 141  K 2.9*  CL 97*  CO2 28  GLUCOSE 117*  BUN 19  CREATININE 2.43*  CALCIUM 9.8   GFR: Estimated Creatinine Clearance: 48.1 mL/min (A) (by C-G formula based on SCr of 2.43 mg/dL (H)). Liver Function Tests: Recent Labs  Lab 11/07/21 1547  AST 21  ALT 17  ALKPHOS 68  BILITOT 1.4*  PROT 8.7*  ALBUMIN 4.0   Recent Labs  Lab 11/07/21 1547  LIPASE 40   No results for input(s): "AMMONIA" in the last 168 hours. Coagulation Profile: No results for input(s): "INR", "PROTIME" in the last 168 hours. Cardiac Enzymes: No results for input(s): "CKTOTAL", "CKMB", "CKMBINDEX", "TROPONINI" in the last 168 hours. BNP (last 3 results) No results for input(s): "PROBNP" in the last 8760 hours. HbA1C: No results for input(s): "HGBA1C" in the last 72 hours. CBG: Recent Labs  Lab 11/07/21 1542  GLUCAP 124*   Lipid Profile: No results for input(s): "CHOL", "HDL", "LDLCALC", "TRIG", "CHOLHDL", "LDLDIRECT" in the last 72 hours. Thyroid Function Tests: No results for input(s): "TSH", "T4TOTAL", "FREET4", "T3FREE", "THYROIDAB" in the last 72 hours. Anemia Panel: No results for input(s): "VITAMINB12", "FOLATE", "FERRITIN", "TIBC", "IRON", "RETICCTPCT" in the last 72 hours. Urine analysis:    Component Value Date/Time   COLORURINE YELLOW (A) 11/07/2021 1547   APPEARANCEUR CLEAR (A) 11/07/2021 1547   LABSPEC 1.031 (H) 11/07/2021 1547   PHURINE 6.0 11/07/2021 1547   GLUCOSEU >=500 (A) 11/07/2021 1547   HGBUR SMALL (A) 11/07/2021 1547   BILIRUBINUR NEGATIVE 11/07/2021 1547    KETONESUR 80 (A) 11/07/2021 1547   PROTEINUR >=300 (A) 11/07/2021 1547   NITRITE NEGATIVE 11/07/2021 1547   LEUKOCYTESUR NEGATIVE 11/07/2021 1547    Radiological Exams on Admission: CT ABDOMEN PELVIS W CONTRAST  Result Date: 11/07/2021 CLINICAL DATA:  Abdominal pain. EXAM: CT ABDOMEN AND PELVIS WITH CONTRAST TECHNIQUE: Multidetector CT imaging of the abdomen and pelvis was performed using the standard protocol following bolus administration of intravenous contrast. RADIATION DOSE REDUCTION: This exam was performed according to the departmental dose-optimization program which includes automated exposure control, adjustment of the mA and/or kV according to patient size and/or use of iterative reconstruction technique. CONTRAST:  1mL OMNIPAQUE IOHEXOL 350 MG/ML SOLN COMPARISON:  None Available. FINDINGS: Lower chest: The lung bases are clear of acute process. No pleural effusion or pulmonary lesions. The heart is normal in size. No pericardial effusion. Small hiatal hernia. Hepatobiliary: No focal hepatic lesions or intrahepatic biliary dilatation. The gallbladder is normal. No common bile duct dilatation. Pancreas: No mass, inflammation ductal dilatation. Spleen: Normal size.  No focal lesions.  Small accessory spleens. Adrenals/Urinary Tract: The adrenal glands are normal. No worrisome renal lesions, renal calculi or hydronephrosis. Bladder is unremarkable. Stomach/Bowel: The stomach, duodenum, small bowel and colon are unremarkable. The terminal ileum is normal. The appendix is thickened and inflamed. There is periappendiceal inflammatory changes. Findings consistent with acute appendicitis. Vascular/Lymphatic: Significant age advanced atherosclerotic calcifications involving the aorta and iliac arteries and branch vessels. No aneurysm. The major venous structures are patent. No mesenteric or retroperitoneal mass or adenopathy. Reproductive: The prostate gland and seminal vesicles are unremarkable. Other:  No pelvic mass or adenopathy. No free pelvic fluid collections. No inguinal mass or adenopathy. No abdominal wall hernia or subcutaneous lesions. Musculoskeletal: No significant bony findings. IMPRESSION: 1. CT findings consistent with acute appendicitis. 2. Significant age advanced atherosclerotic calcifications involving the aorta and iliac arteries and branch vessels. Electronically Signed  By: Marijo Sanes M.D.   On: 11/07/2021 18:53     Data Reviewed: Relevant notes from primary care and specialist visits, past discharge summaries as available in EHR, including Care Everywhere. Prior diagnostic testing as pertinent to current admission diagnoses Updated medications and problem lists for reconciliation ED course, including vitals, labs, imaging, treatment and response to treatment Triage notes, nursing and pharmacy notes and ED provider's notes Notable results as noted in HPI   Assessment and Plan: * Acute appendicitis Possible severe sepsis Patient has a 1 week history of vomiting and lower abdominal pain with CT findings consistent with acute appendicitis.  He is tachycardic to the 130s and tachypneic to 26 with AKI.  Lactic acid pending Discussed with Dr. Christian Mate over the phone who would like patient stabilized from his acute medical conditions with plans to take to the OR on 9/28.  Recommends Zosyn Keep n.p.o. IV hydration IV Zosyn Pain management, IV antiemetics and supportive care  Hypokalemia Potassium was 2.9 and patient received an IV potassium rider in the ED Secondary to losses from vomiting Serial potassium monitoring and replete as needed  Acute renal failure superimposed on stage 3a chronic kidney disease ([place]) Secondary to ATN from dehydration from intractable vomiting related to acute appendicitis Patient received 3 L fluid bolus in the ED Continue IV hydration, monitoring for overload Avoid nephrotoxins  Sleep apnea CPAP nightly  History of stroke Will  hold antiplatelet and statin tonight while n.p.o. for surgery in the a.m.  Type 2 diabetes mellitus with diabetic nephropathy (HCC) Low-dose basal insulin as patient will be n.p.o. Sliding scale insulin  Essential hypertension Blood pressure controlled Hydralazine IV as needed to keep systolic under 001 while n.p.o.  Systolic CHF, chronic (HCC) Nonischemic cardiomyopathy from hypertensive heart disease, with improved EF 55 to 60% with G1 DD 07/2019, Clinically dry Monitor for decompensation in view of fluid boluses received in the ED and ongoing IV fluids Daily weights with intake and output monitoring Hold carvedilol and Lasix while n.p.o.         DVT prophylaxis: SCD  Consults: Surgery Dr. Milas Gain  Advance Care Planning:   Code Status: Prior   Family Communication: significant other at bedside  Disposition Plan: Back to previous home environment  Severity of Illness: The appropriate patient status for this patient is INPATIENT. Inpatient status is judged to be reasonable and necessary in order to provide the required intensity of service to ensure the patient's safety. The patient's presenting symptoms, physical exam findings, and initial radiographic and laboratory data in the context of their chronic comorbidities is felt to place them at high risk for further clinical deterioration. Furthermore, it is not anticipated that the patient will be medically stable for discharge from the hospital within 2 midnights of admission.   * I certify that at the point of admission it is my clinical judgment that the patient will require inpatient hospital care spanning beyond 2 midnights from the point of admission due to high intensity of service, high risk for further deterioration and high frequency of surveillance required.*  Author: Athena Masse, MD 11/07/2021 8:16 PM  For on call review [email or web address].

## 2021-11-07 NOTE — Assessment & Plan Note (Addendum)
Possible severe sepsis Patient has a 1 week history of vomiting and lower abdominal pain with CT findings consistent with acute appendicitis.  He is tachycardic to the 130s and tachypneic to 26.  Lactic acid pending Discussed with Dr. Christian Mate over the phone who would like patient stabilized from his acute medical conditions with plans to take to the OR on 9/28.  Recommends Zosyn Keep n.p.o. IV hydration IV Zosyn Pain management, IV antiemetics and supportive care

## 2021-11-07 NOTE — Assessment & Plan Note (Signed)
Low-dose basal insulin as patient will be n.p.o. Sliding scale insulin

## 2021-11-07 NOTE — ED Triage Notes (Signed)
First nurse note: Patient arrived by EMS with c/o emesis X1 week. Seen at [facility] yesterday. Reports weakness.   Reports he has not taken his medicines in a week  EMS administered zofran 4mg  IM  EMS vitals: 116/88 b/p 138 CBG

## 2021-11-07 NOTE — ED Triage Notes (Signed)
Pt via EMS from home. Pt c/o nausea, vomiting, and lower abd pain for about a week. States he has been out of his medication for a week. Pt has a hx of DM and HTN. Pt is A&OX4 and NAD

## 2021-11-07 NOTE — Assessment & Plan Note (Addendum)
Potassium was 2.9 and patient received an IV potassium rider in the ED Secondary to losses from vomiting Serial potassium monitoring and replete as needed

## 2021-11-07 NOTE — Assessment & Plan Note (Addendum)
Nonischemic cardiomyopathy from hypertensive heart disease, with improved EF 55 to 60% with G1 DD 07/2019, Clinically dry Monitor for decompensation in view of fluid boluses received in the ED and ongoing IV fluids Daily weights with intake and output monitoring Hold carvedilol and Lasix while n.p.o.

## 2021-11-08 ENCOUNTER — Encounter
Admission: EM | Disposition: A | Discharge status: Home / Self Care | Payer: Self-pay | Source: Home / Self Care | Attending: Family Medicine

## 2021-11-08 ENCOUNTER — Inpatient Hospital Stay: Payer: Medicare Other | Admitting: Anesthesiology

## 2021-11-08 ENCOUNTER — Other Ambulatory Visit: Payer: Self-pay

## 2021-11-08 DIAGNOSIS — K3580 Unspecified acute appendicitis: Secondary | ICD-10-CM | POA: Diagnosis not present

## 2021-11-08 HISTORY — PX: XI ROBOTIC LAPAROSCOPIC ASSISTED APPENDECTOMY: SHX6877

## 2021-11-08 LAB — HIV ANTIBODY (ROUTINE TESTING W REFLEX): HIV Screen 4th Generation wRfx: NONREACTIVE

## 2021-11-08 LAB — BASIC METABOLIC PANEL
Anion gap: 11 (ref 5–15)
BUN: 16 mg/dL (ref 6–20)
CO2: 26 mmol/L (ref 22–32)
Calcium: 8.5 mg/dL — ABNORMAL LOW (ref 8.9–10.3)
Chloride: 105 mmol/L (ref 98–111)
Creatinine, Ser: 2.02 mg/dL — ABNORMAL HIGH (ref 0.61–1.24)
GFR, Estimated: 42 mL/min — ABNORMAL LOW (ref >60)
Glucose, Bld: 93 mg/dL (ref 70–99)
Potassium: 3.1 mmol/L — ABNORMAL LOW (ref 3.5–5.1)
Sodium: 142 mmol/L (ref 135–145)

## 2021-11-08 LAB — CBC
HCT: 42.5 % (ref 39.0–52.0)
Hemoglobin: 14.2 g/dL (ref 13.0–17.0)
MCH: 26.5 pg (ref 26.0–34.0)
MCHC: 33.4 g/dL (ref 30.0–36.0)
MCV: 79.4 fL — ABNORMAL LOW (ref 80.0–100.0)
Platelets: 202 10*3/uL (ref 150–400)
RBC: 5.35 MIL/uL (ref 4.22–5.81)
RDW: 13.8 % (ref 11.5–15.5)
WBC: 6.7 10*3/uL (ref 4.0–10.5)
nRBC: 0 % (ref 0.0–0.2)

## 2021-11-08 LAB — GLUCOSE, CAPILLARY
Glucose-Capillary: 90 mg/dL (ref 70–99)
Glucose-Capillary: 77 mg/dL (ref 70–99)
Glucose-Capillary: 81 mg/dL (ref 70–99)
Glucose-Capillary: 76 mg/dL (ref 70–99)
Glucose-Capillary: 125 mg/dL — ABNORMAL HIGH (ref 70–99)
Glucose-Capillary: 105 mg/dL — ABNORMAL HIGH (ref 70–99)

## 2021-11-08 LAB — PROTIME-INR
INR: 1.1 (ref 0.8–1.2)
Prothrombin Time: 14.1 seconds (ref 11.4–15.2)

## 2021-11-08 LAB — LACTIC ACID, PLASMA
Lactic Acid, Venous: 0.9 mmol/L (ref 0.5–1.9)
Lactic Acid, Venous: 0.9 mmol/L (ref 0.5–1.9)

## 2021-11-08 LAB — PROCALCITONIN: Procalcitonin: <0.1 ng/mL

## 2021-11-08 LAB — CORTISOL-AM, BLOOD: Cortisol - AM: 5.8 ug/dL — ABNORMAL LOW (ref 6.7–22.6)

## 2021-11-08 LAB — HEMOGLOBIN A1C
Hgb A1c MFr Bld: 5.7 % — ABNORMAL HIGH (ref 4.8–5.6)
Mean Plasma Glucose: 116.89 mg/dL

## 2021-11-08 SURGERY — APPENDECTOMY, ROBOT-ASSISTED, LAPAROSCOPIC
Anesthesia: General | Site: Abdomen

## 2021-11-08 MED ORDER — ROCURONIUM BROMIDE 10 MG/ML (PF) SYRINGE
PREFILLED_SYRINGE | INTRAVENOUS | Status: AC
  Filled 2021-11-08: qty 10

## 2021-11-08 MED ORDER — LACTATED RINGERS IV SOLN: INTRAVENOUS | Status: DC

## 2021-11-08 MED ORDER — ACETAMINOPHEN 325 MG PO TABS: 650.0000 mg | ORAL_TABLET | Freq: Four times a day (QID) | ORAL | Status: DC | PRN

## 2021-11-08 MED ORDER — FENTANYL CITRATE (PF) 100 MCG/2ML IJ SOLN
INTRAMUSCULAR | Status: AC
  Filled 2021-11-08: qty 2

## 2021-11-08 MED ORDER — BUPIVACAINE-EPINEPHRINE (PF) 0.25% -1:200000 IJ SOLN
INTRAMUSCULAR | Status: AC
  Filled 2021-11-08: qty 30

## 2021-11-08 MED ORDER — PROPOFOL 10 MG/ML IV BOLUS
INTRAVENOUS | Status: AC
  Filled 2021-11-08: qty 20

## 2021-11-08 MED ORDER — SOD CITRATE-CITRIC ACID 500-334 MG/5ML PO SOLN
30.0000 mL | Freq: Once | ORAL | Status: AC
  Administered 2021-11-08: 30 mL via ORAL
  Filled 2021-11-08: qty 30

## 2021-11-08 MED ORDER — ACETAMINOPHEN 10 MG/ML IV SOLN
INTRAVENOUS | Status: AC
  Filled 2021-11-08: qty 100

## 2021-11-08 MED ORDER — OXYCODONE HCL 5 MG PO TABS: 5.0000 mg | ORAL_TABLET | Freq: Once | ORAL | Status: DC | PRN

## 2021-11-08 MED ORDER — OXYCODONE-ACETAMINOPHEN 5-325 MG PO TABS
1.0000 | ORAL_TABLET | Freq: Four times a day (QID) | ORAL | Status: DC | PRN
  Administered 2021-11-09 (×2): 2 via ORAL
  Filled 2021-11-08 (×3): qty 2

## 2021-11-08 MED ORDER — SUGAMMADEX SODIUM 500 MG/5ML IV SOLN
INTRAVENOUS | Status: AC
  Filled 2021-11-08: qty 5

## 2021-11-08 MED ORDER — FENTANYL CITRATE (PF) 100 MCG/2ML IJ SOLN
INTRAMUSCULAR | Status: AC
  Administered 2021-11-08: 25 ug via INTRAVENOUS
  Filled 2021-11-08: qty 2

## 2021-11-08 MED ORDER — MIDAZOLAM HCL 2 MG/2ML IJ SOLN
INTRAMUSCULAR | Status: AC
  Filled 2021-11-08: qty 2

## 2021-11-08 MED ORDER — SUCCINYLCHOLINE CHLORIDE 200 MG/10ML IV SOSY
PREFILLED_SYRINGE | INTRAVENOUS | Status: DC | PRN
  Administered 2021-11-08: 140 mg via INTRAVENOUS

## 2021-11-08 MED ORDER — PROPOFOL 10 MG/ML IV BOLUS
INTRAVENOUS | Status: DC | PRN
  Administered 2021-11-08: 170 mg via INTRAVENOUS

## 2021-11-08 MED ORDER — PHENYLEPHRINE HCL (PRESSORS) 10 MG/ML IV SOLN
INTRAVENOUS | Status: DC | PRN
  Administered 2021-11-08 (×2): 80 ug via INTRAVENOUS

## 2021-11-08 MED ORDER — SUCCINYLCHOLINE CHLORIDE 200 MG/10ML IV SOSY
PREFILLED_SYRINGE | INTRAVENOUS | Status: AC
  Filled 2021-11-08: qty 10

## 2021-11-08 MED ORDER — DEXAMETHASONE SODIUM PHOSPHATE 10 MG/ML IJ SOLN
INTRAMUSCULAR | Status: AC
  Filled 2021-11-08: qty 1

## 2021-11-08 MED ORDER — BUPIVACAINE-EPINEPHRINE (PF) 0.25% -1:200000 IJ SOLN
INTRAMUSCULAR | Status: DC | PRN
  Administered 2021-11-08: 10 mL via PERINEURAL

## 2021-11-08 MED ORDER — SUGAMMADEX SODIUM 500 MG/5ML IV SOLN
INTRAVENOUS | Status: DC | PRN
  Administered 2021-11-08: 250 mg via INTRAVENOUS

## 2021-11-08 MED ORDER — FENTANYL CITRATE (PF) 100 MCG/2ML IJ SOLN
INTRAMUSCULAR | Status: DC | PRN
  Administered 2021-11-08: 100 ug via INTRAVENOUS

## 2021-11-08 MED ORDER — DEXAMETHASONE SODIUM PHOSPHATE 10 MG/ML IJ SOLN
INTRAMUSCULAR | Status: DC | PRN
  Administered 2021-11-08: 10 mg via INTRAVENOUS

## 2021-11-08 MED ORDER — SODIUM CHLORIDE 0.9 % IV SOLN: INTRAVENOUS | Status: DC | PRN

## 2021-11-08 MED ORDER — ALUM & MAG HYDROXIDE-SIMETH 200-200-20 MG/5ML PO SUSP
30.0000 mL | Freq: Four times a day (QID) | ORAL | Status: DC | PRN
  Administered 2021-11-08 (×2): 30 mL via ORAL
  Filled 2021-11-08 (×2): qty 30

## 2021-11-08 MED ORDER — ONDANSETRON HCL 4 MG/2ML IJ SOLN
INTRAMUSCULAR | Status: DC | PRN
  Administered 2021-11-08: 4 mg via INTRAVENOUS

## 2021-11-08 MED ORDER — AMLODIPINE BESYLATE 5 MG PO TABS
5.0000 mg | ORAL_TABLET | Freq: Every day | ORAL | Status: DC
  Administered 2021-11-08 – 2021-11-09 (×2): 5 mg via ORAL
  Filled 2021-11-08 (×2): qty 1

## 2021-11-08 MED ORDER — BUPIVACAINE LIPOSOME 1.3 % IJ SUSP
INTRAMUSCULAR | Status: AC
  Filled 2021-11-08: qty 20

## 2021-11-08 MED ORDER — MIDAZOLAM HCL 2 MG/2ML IJ SOLN
INTRAMUSCULAR | Status: DC | PRN
  Administered 2021-11-08: 2 mg via INTRAVENOUS

## 2021-11-08 MED ORDER — FENTANYL CITRATE (PF) 100 MCG/2ML IJ SOLN
25.0000 ug | INTRAMUSCULAR | Status: DC | PRN
  Administered 2021-11-08: 25 ug via INTRAVENOUS
  Administered 2021-11-08: 50 ug via INTRAVENOUS

## 2021-11-08 MED ORDER — OXYCODONE HCL 5 MG/5ML PO SOLN: 5.0000 mg | Freq: Once | ORAL | Status: DC | PRN

## 2021-11-08 MED ORDER — ACETAMINOPHEN 10 MG/ML IV SOLN: 1000.0000 mg | Freq: Once | INTRAVENOUS | Status: DC | PRN

## 2021-11-08 MED ORDER — LIDOCAINE HCL (CARDIAC) PF 100 MG/5ML IV SOSY
PREFILLED_SYRINGE | INTRAVENOUS | Status: DC | PRN
  Administered 2021-11-08: 100 mg via INTRAVENOUS

## 2021-11-08 MED ORDER — ONDANSETRON HCL 4 MG/2ML IJ SOLN
INTRAMUSCULAR | Status: AC
  Filled 2021-11-08: qty 2

## 2021-11-08 MED ORDER — ROCURONIUM BROMIDE 100 MG/10ML IV SOLN
INTRAVENOUS | Status: DC | PRN
  Administered 2021-11-08: 50 mg via INTRAVENOUS
  Administered 2021-11-08: 10 mg via INTRAVENOUS

## 2021-11-08 MED ORDER — LIDOCAINE HCL (PF) 2 % IJ SOLN
INTRAMUSCULAR | Status: AC
  Filled 2021-11-08: qty 5

## 2021-11-08 MED ORDER — ACETAMINOPHEN 10 MG/ML IV SOLN
INTRAVENOUS | Status: DC | PRN
  Administered 2021-11-08: 1000 mg via INTRAVENOUS

## 2021-11-08 MED ORDER — INSULIN DETEMIR 100 UNIT/ML ~~LOC~~ SOLN
15.0000 [IU] | Freq: Every day | SUBCUTANEOUS | Status: DC
  Filled 2021-11-08: qty 0.15

## 2021-11-08 MED ORDER — ACETAMINOPHEN 650 MG RE SUPP: 650.0000 mg | Freq: Four times a day (QID) | RECTAL | Status: DC | PRN

## 2021-11-08 MED ORDER — ONDANSETRON HCL 4 MG/2ML IJ SOLN: 4.0000 mg | Freq: Once | INTRAMUSCULAR | Status: DC | PRN

## 2021-11-08 MED ORDER — POTASSIUM CHLORIDE 10 MEQ/100ML IV SOLN
10.0000 meq | INTRAVENOUS | Status: AC
  Administered 2021-11-08 (×3): 10 meq via INTRAVENOUS
  Filled 2021-11-08 (×3): qty 100

## 2021-11-08 SURGICAL SUPPLY — 51 items
BAG PRESSURE INF REUSE 3000 (BAG) IMPLANT
BLADE CLIPPER SURG (BLADE) IMPLANT
CANNULA REDUC XI 12-8 STAPL (CANNULA) ×1
CANNULA REDUCER 12-8 DVNC XI (CANNULA) ×1 IMPLANT
COVER TIP SHEARS 8 DVNC (MISCELLANEOUS) ×1 IMPLANT
COVER TIP SHEARS 8MM DA VINCI (MISCELLANEOUS) ×1
CUTTER FLEX LINEAR 45M (STAPLE) IMPLANT
DERMABOND ADVANCED .7 DNX12 (GAUZE/BANDAGES/DRESSINGS) ×1 IMPLANT
DRAPE 3/4 80X56 (DRAPES) IMPLANT
DRAPE ARM DVNC X/XI (DISPOSABLE) ×3 IMPLANT
DRAPE COLUMN DVNC XI (DISPOSABLE) ×1 IMPLANT
DRAPE DA VINCI XI ARM (DISPOSABLE) ×3
DRAPE DA VINCI XI COLUMN (DISPOSABLE) ×1
GLOVE ORTHO TXT STRL SZ7.5 (GLOVE) ×2 IMPLANT
GOWN STRL REUS W/ TWL LRG LVL3 (GOWN DISPOSABLE) ×1 IMPLANT
GOWN STRL REUS W/ TWL XL LVL3 (GOWN DISPOSABLE) ×2 IMPLANT
GOWN STRL REUS W/TWL LRG LVL3 (GOWN DISPOSABLE) ×1
GOWN STRL REUS W/TWL XL LVL3 (GOWN DISPOSABLE) ×2
GRASPER SUT TROCAR 14GX15 (MISCELLANEOUS) IMPLANT
IRRIGATION STRYKERFLOW (MISCELLANEOUS) IMPLANT
IRRIGATOR STRYKERFLOW (MISCELLANEOUS)
IRRIGATOR SUCT 8 DISP DVNC XI (IRRIGATION / IRRIGATOR) IMPLANT
IRRIGATOR SUCTION 8MM XI DISP (IRRIGATION / IRRIGATOR)
IV NS IRRIG 3000ML ARTHROMATIC (IV SOLUTION) IMPLANT
KIT PINK PAD W/HEAD ARE REST (MISCELLANEOUS) ×1 IMPLANT
KIT PINK PAD W/HEAD ARM REST (MISCELLANEOUS) ×1 IMPLANT
KIT TURNOVER KIT A (KITS) ×1 IMPLANT
LABEL OR SOLS (LABEL) ×1 IMPLANT
MANIFOLD NEPTUNE II (INSTRUMENTS) ×1 IMPLANT
NDL INSUFFLATION 14GA 120MM (NEEDLE) IMPLANT
NEEDLE HYPO 22GX1.5 SAFETY (NEEDLE) ×1 IMPLANT
NEEDLE INSUFFLATION 14GA 120MM (NEEDLE) IMPLANT
NS IRRIG 500ML POUR BTL (IV SOLUTION) ×1 IMPLANT
PACK LAP CHOLECYSTECTOMY (MISCELLANEOUS) ×1 IMPLANT
RELOAD 45 VASCULAR/THIN (ENDOMECHANICALS) ×1 IMPLANT
RELOAD STAPLE 45 2.5 WHT GRN (ENDOMECHANICALS) IMPLANT
SEAL CANN UNIV 5-8 DVNC XI (MISCELLANEOUS) ×3 IMPLANT
SEAL XI 5MM-8MM UNIVERSAL (MISCELLANEOUS) ×3
SET TUBE SMOKE EVAC HIGH FLOW (TUBING) ×1 IMPLANT
STAPLER CANNULA SEAL DVNC XI (STAPLE) ×1 IMPLANT
STAPLER CANNULA SEAL XI (STAPLE) ×1
SUT MNCRL 4-0 (SUTURE) ×1
SUT MNCRL 4-0 27XMFL (SUTURE) ×1
SUT VIC AB 0 SH 27 (SUTURE) ×1 IMPLANT
SUT VICRYL 0 AB UR-6 (SUTURE) ×1 IMPLANT
SUTURE MNCRL 4-0 27XMF (SUTURE) ×1 IMPLANT
SYS BAG RETRIEVAL 10MM (BASKET) ×1
SYSTEM BAG RETRIEVAL 10MM (BASKET) ×1 IMPLANT
TRAP FLUID SMOKE EVACUATOR (MISCELLANEOUS) ×1 IMPLANT
TRAY FOLEY SLVR 16FR LF STAT (SET/KITS/TRAYS/PACK) ×1 IMPLANT
TROCAR Z-THREAD FIOS 11X100 BL (TROCAR) ×1 IMPLANT

## 2021-11-08 NOTE — Transfer of Care (Signed)
Immediate Anesthesia Transfer of Care Note  Patient: Joel [name]  Procedure(s) Performed: XI ROBOTIC LAPAROSCOPIC ASSISTED APPENDECTOMY (Abdomen)  Patient Location: PACU  Anesthesia Type:General  Level of Consciousness: drowsy  Airway & Oxygen Therapy: Patient Spontanous Breathing and Patient connected to face mask oxygen  Post-op Assessment: Report given to RN and Post -op Vital signs reviewed and stable  Post vital signs: Reviewed and stable  Last Vitals:  Vitals Value Taken Time  BP 137/92   Temp    Pulse 101   Resp 24   SpO2 100     Last Pain:  Vitals:   11/08/21 1612  TempSrc: Temporal  PainSc: 0-No pain         Complications: No notable events documented.

## 2021-11-08 NOTE — Discharge Instructions (Signed)
In addition to included general post-operative instructions,  Diet: Resume home diet.   Activity: No heavy lifting >20 pounds (children, pets, laundry, garbage) or strenuous activity for 4 weeks, but light activity and walking are encouraged. Do not drive or drink alcohol if taking narcotic pain medications or having pain that might distract from driving.  Wound care: 2 days after surgery (09/30), you may shower/get incision wet with soapy water and pat dry (do not rub incisions), but no baths or submerging incision underwater until follow-up.   Medications: Resume all home medications. For mild to moderate pain: acetaminophen (Tylenol) or ibuprofen/naproxen (if no kidney disease). Combining Tylenol with alcohol can substantially increase your risk of causing liver disease. Narcotic pain medications, if prescribed, can be used for severe pain, though may cause nausea, constipation, and drowsiness. Do not combine Tylenol and Percocet (or similar) within a 6 hour period as Percocet (and similar) contain(s) Tylenol. If you do not need the narcotic pain medication, you do not need to fill the prescription.  Call office [phone]) at any time if any questions, worsening pain, fevers/chills, bleeding, drainage from incision site, or other concerns.

## 2021-11-08 NOTE — TOC Initial Note (Signed)
Transition of Care [facility]) - Initial/Assessment Note    Patient Details  Name: Joel [name] MRN: [id number] Date of Birth: 1980/03/05  Transition of Care [facility]) CM/SW Contact:    Beverly Sessions, RN Phone Number: 11/08/2021, 11:31 AM  Clinical Narrative:                   Transition of Care [facility]) Screening Note   Patient Details  Name: Joel [name] Date of Birth: 07/26/1980   Transition of Care [facility]) CM/SW Contact:    Beverly Sessions, RN Phone Number: 11/08/2021, 11:31 AM    Transition of Care Department [facility]) has reviewed patient and no TOC needs have been identified at this time. We will continue to monitor patient advancement through interdisciplinary progression rounds. If new patient transition needs arise, please place a TOC consult.         Patient Goals and CMS Choice        Expected Discharge Plan and Services                                                Prior Living Arrangements/Services                       Activities of Daily Living Home Assistive Devices/Equipment: None ADL Screening (condition at time of admission) Patient's cognitive ability adequate to safely complete daily activities?: Yes Is the patient deaf or have difficulty hearing?: No Does the patient have difficulty seeing, even when wearing glasses/contacts?: No Does the patient have difficulty concentrating, remembering, or making decisions?: No Patient able to express need for assistance with ADLs?: No Does the patient have difficulty dressing or bathing?: No Independently performs ADLs?: Yes (appropriate for developmental age) Does the patient have difficulty walking or climbing stairs?: No Weakness of Legs: None Weakness of Arms/Hands: None  Permission Sought/Granted                  Emotional Assessment              Admission diagnosis:  Acute appendicitis [K35.80] Patient Active Problem List   Diagnosis Date Noted   Acute appendicitis  11/07/2021   Acute renal failure superimposed on stage 3a chronic kidney disease ([place]) 11/07/2021   History of stroke 11/07/2021   Sleep apnea    Hypokalemia    Severe sepsis ([place])    Morbid obesity with BMI of 40.0-44.9, adult ([place]) 04/27/2019   Orthostatic hypotension 04/14/2019   Acute CVA (cerebrovascular accident) ([place]) 03/19/2019   Depression, major, recurrent, moderate ([place]) 12/19/2017   Fatigue 04/07/2017   Dilated cardiomyopathy ([place]) 02/23/2017   Hypertensive heart disease with heart failure ([place]) 02/23/2017   Essential hypertension 02/21/2017   Type 2 diabetes mellitus with diabetic nephropathy ([place]) 02/21/2017   H/O medication noncompliance 02/12/2017   Stage 3a chronic kidney disease ([place]) 73/71/0626   Systolic CHF, chronic ([place]) 10/11/2014   PCP:  [name], DO Pharmacy:   [facility] [address]), [place] [address] [place] ([place]) [place] [address] Phone: [phone] Fax: [phone]  [facility] [id number], [place] - [address] [address] Phone: [phone] Fax: [phone]     Social Determinants of Health (SDOH) Interventions    Readmission Risk Interventions  No data to display

## 2021-11-08 NOTE — Consult Note (Addendum)
[facility] SURGICAL CONSULTATION NOTE (initial) - cpt: 99244   HISTORY OF PRESENT ILLNESS (HPI):  41 y.o. male presented to [facility] ED yesterday for evaluation of nausea. Patient reports he has been having milder abdominal pain since last Tuesday. This seemed to be localized on his right side. Worsening in the last 24-48 hours with accompanying nausea, emesis, and inability to tolerate PO. No fever, chills, cough, CP, SOB, urinary changes, or bowel changes. No previous intra-abdominal procedures. Work up in the ED revealed acute on chronic kidney injury with sCr 2.43 (now 2.02 - baseline), hypokalemia to 2.9 (now 3.1), normal WBC at 6.0K, Hgb 17.1, normal lactic acid level at 1.6. He did have CT Abdomen/pelvis concerning for acute uncomplicated appendicitis. He was admitted to medicine service given his comorbidities. Started on Zosyn.   Surgery is consulted by emergency medicine physician Dr. Conni Slipper, MD in this context for evaluation and management of acute appendicitis in setting of uncontrolled DM, AKI, and hypokalemia amongst other comorbid conditions.    PAST MEDICAL HISTORY (PMH):  Past Medical History:  Diagnosis Date   Anxiety    Chest pain    Chronic diastolic CHF (congestive heart failure) (HCC)    Constipation    Depression    GERD (gastroesophageal reflux disease)    Hyperlipidemia    Hypertension    Hypertensive heart disease    Lactose intolerance    Noncompliance with medications    Obesity    Palpitations    Renal disorder    Shortness of breath on exertion    Sleep apnea    Stroke ([place])    Systolic dysfunction    a. TTE 06/2014: EF 45-50%, normal wall motion, mild MR, mild AI     PAST SURGICAL HISTORY ([place]):  Past Surgical History:  Procedure Laterality Date   NO PAST SURGERIES       MEDICATIONS:  Prior to Admission medications   Medication Sig Start Date End Date Taking? Authorizing Provider  Alirocumab (PRALUENT) 75 MG/ML SOAJ Inject 1  Pen into the skin every 14 (fourteen) days. 09/20/21  Yes Minna Merritts, MD  amLODipine (NORVASC) 10 MG tablet Take 1 tablet by mouth once daily 10/16/21  Yes Gollan, Kathlene November, MD  carvedilol (COREG) 25 MG tablet Take 1 tablet (25 mg total) by mouth 2 (two) times daily with a meal. 10/10/20  Yes Gollan, Kathlene November, MD  clopidogrel (PLAVIX) 75 MG tablet Take 1 tablet (75 mg total) by mouth daily. 10/10/20  Yes Gollan, Kathlene November, MD  escitalopram (LEXAPRO) 10 MG tablet Take 15 mg by mouth daily. 12/08/20  Yes [provider]  ezetimibe (ZETIA) 10 MG tablet Take 1 tablet (10 mg total) by mouth daily. 09/20/21  Yes Gollan, Kathlene November, MD  JARDIANCE 25 MG TABS tablet Take 25 mg by mouth daily. 11/05/21  Yes [provider]  montelukast (SINGULAIR) 10 MG tablet TAKE 1 TABLET BY MOUTH AT BEDTIME 10/17/21  Yes Karamalegos, Devonne Doughty, DO  omeprazole (PRILOSEC) 20 MG capsule Take 20 mg by mouth daily.   Yes [provider]  OZEMPIC, 2 MG/DOSE, 8 MG/3ML SOPN Inject 2 mg into the skin once a week. 10/17/21  Yes [provider]  rosuvastatin (CRESTOR) 40 MG tablet Take 1 tablet (40 mg total) by mouth daily. 09/20/21  Yes Minna Merritts, MD  topiramate (TOPAMAX) 100 MG tablet Take 100 mg by mouth at bedtime. 07/04/21  Yes [provider]  TRESIBA FLEXTOUCH 100 UNIT/ML FlexTouch Pen  Inject 22 Units into the skin daily. 08/09/21  Yes [provider]  cyclobenzaprine (FLEXERIL) 10 MG tablet TAKE 1 TABLET BY MOUTH TWICE DAILY AS NEEDED FOR MUSCLE SPASM 10/17/21   Parks Ranger, Devonne Doughty, DO  furosemide (LASIX) 20 MG tablet Take 1 tablet (20 mg total) by mouth daily as needed. For shortness of breath 01/15/21   Minna Merritts, MD  ondansetron (ZOFRAN-ODT) 4 MG disintegrating tablet Take 1 tablet (4 mg total) by mouth every 8 (eight) hours as needed for nausea or vomiting. 11/06/21   Parks Ranger, Devonne Doughty, DO  promethazine (PHENERGAN) 25 MG tablet Take 25 mg by mouth  every 4 (four) hours as needed. 11/05/21   [provider]     ALLERGIES:  Allergies  Allergen Reactions   Bidil [Isosorb Dinitrate-Hydralazine] Other (See Comments)    Severe headache   Nitroglycerin Other (See Comments)    Causes severe headaches     SOCIAL HISTORY:  Social History   Socioeconomic History   Marital status: Married    Spouse name: Novah Goza   Number of children: 1   Years of education: 12   Highest education level: 12th grade  Occupational History   Occupation: Disabled  Tobacco Use   Smoking status: Never   Smokeless tobacco: Never  Vaping Use   Vaping Use: Never used  Substance and Sexual Activity   Alcohol use: Not Currently    Comment: Very infrequently; ~2-3 drinks/year    Drug use: No   Sexual activity: Not Currently    Birth control/protection: None  Other Topics Concern   Not on file  Social History Narrative   Not on file   Social Determinants of Health   Financial Resource Strain: Medium Risk (10/09/2021)   Overall Financial Resource Strain (CARDIA)    Difficulty of Paying Living Expenses: Somewhat hard  Food Insecurity: No Food Insecurity (11/08/2021)   Hunger Vital Sign    Worried About Running Out of Food in the Last Year: Never true    Ran Out of Food in the Last Year: Never true  Transportation Needs: No Transportation Needs (11/08/2021)   PRAPARE - Hydrologist (Medical): No    Lack of Transportation (Non-Medical): No  Physical Activity: Inactive (10/03/2020)   Exercise Vital Sign    Days of Exercise per Week: 0 days    Minutes of Exercise per Session: 0 min  Stress: No Stress Concern Present (10/03/2020)   [place]    Feeling of Stress : Not at all  Social Connections: Moderately Isolated (10/09/2021)   Social Connection and Isolation Panel [NHANES]    Frequency of Communication with Friends and Family: Twice a week     Frequency of Social Gatherings with Friends and Family: Once a week    Attends Religious Services: Never    Marine scientist or Organizations: No    Attends Archivist Meetings: Never    Marital Status: Married  Human resources officer Violence: Not At Risk (10/09/2021)   Humiliation, Afraid, Rape, and Kick questionnaire    Fear of Current or Ex-Partner: No    Emotionally Abused: No    Physically Abused: No    Sexually Abused: No     FAMILY HISTORY:  Family History  Problem Relation Age of Onset   Cancer Mother    Hypertension Mother    Alcoholism Father    Hyperlipidemia Father    Diabetes Father  CAD Father        a. stenting in his early 50s   Heart disease Father    Stroke Father    Heart attack Father    Diabetes Maternal Grandmother    Diabetes Maternal Grandfather    CAD Paternal Grandmother    CAD Paternal Grandfather    Colon cancer Paternal Grandfather    Diabetes Paternal Uncle       REVIEW OF SYSTEMS:  Review of Systems  Constitutional:  Negative for chills and fever.       + Decrease PO Intake   Respiratory:  Negative for cough and shortness of breath.   Cardiovascular:  Negative for chest pain and palpitations.  Gastrointestinal:  Positive for abdominal pain, nausea and vomiting. Negative for blood in stool, constipation and diarrhea.  Genitourinary:  Negative for dysuria and urgency.  All other systems reviewed and are negative.   VITAL SIGNS:  Temp:  [98.3 F (36.8 C)-99.3 F (37.4 C)] 98.3 F (36.8 C) (09/28 0321) Pulse Rate:  [103-136] 107 (09/28 0321) Resp:  [10-24] 16 (09/28 0034) BP: (121-158)/(91-117) 149/110 (09/28 0321) SpO2:  [97 %-100 %] 97 % (09/28 0321) Weight:  [111.1 kg] 111.1 kg (09/27 1540)     Height: 5' 7" (170.2 cm) Weight: 111.1 kg BMI (Calculated): 38.36   INTAKE/OUTPUT:  09/27 0701 - 09/28 0700 In: 3738.3 [I.V.:289.7; IV Piggyback:3448.5] Out: 400 [Urine:400]  PHYSICAL EXAM:  Physical Exam Vitals and  nursing note reviewed. Exam conducted with a chaperone present.  Constitutional:      General: He is not in acute distress.    Appearance: Normal appearance. He is not ill-appearing.     Comments: Patient resting in bed, appears more comfortable, NAD  HENT:     Head: Normocephalic and atraumatic.  Eyes:     General: No scleral icterus.    Conjunctiva/sclera: Conjunctivae normal.  Cardiovascular:     Rate and Rhythm: Normal rate.     Pulses: Normal pulses.     Heart sounds: No murmur heard. Pulmonary:     Effort: Pulmonary effort is normal. No respiratory distress.  Abdominal:     General: Abdomen is flat. There is no distension.     Palpations: Abdomen is soft.     Tenderness: There is abdominal tenderness in the right lower quadrant. There is no guarding or rebound. Negative signs include Rovsing's sign and McBurney's sign.     Comments: Abdomen is soft, he reports soreness with deep palpation in RLQ, non-distended, no rebound/guarding.   Genitourinary:    Comments: Deferred Musculoskeletal:     Right lower leg: No edema.     Left lower leg: No edema.  Skin:    General: Skin is warm and dry.  Neurological:     General: No focal deficit present.     Mental Status: He is alert and oriented to person, place, and time.  Psychiatric:        Mood and Affect: Mood normal.        Behavior: Behavior normal.      Labs:     Latest Ref Rng & Units 11/08/2021    3:35 AM 11/07/2021    3:47 PM 07/25/2020    4:28 PM  CBC  WBC 4.0 - 10.5 K/uL 6.7  6.0  4.3   Hemoglobin 13.0 - 17.0 g/dL 14.2  17.1  14.6   Hematocrit 39.0 - 52.0 % 42.5  52.2  44.0   Platelets 150 - 400 K/uL 202  238    224       Latest Ref Rng & Units 11/08/2021    3:35 AM 11/07/2021    8:55 PM 11/07/2021    3:47 PM  CMP  Glucose 70 - 99 mg/dL 93  95  117   BUN 6 - 20 mg/dL 16  17  19    Creatinine 0.61 - 1.24 mg/dL 2.02  2.07  2.43   Sodium 135 - 145 mmol/L 142  141  141   Potassium 3.5 - 5.1 mmol/L 3.1  2.8  2.9    Chloride 98 - 111 mmol/L 105  101  97   CO2 22 - 32 mmol/L 26  27  28    Calcium 8.9 - 10.3 mg/dL 8.5  8.5  9.8   Total Protein 6.5 - 8.1 g/dL   8.7   Total Bilirubin 0.3 - 1.2 mg/dL   1.4   Alkaline Phos 38 - 126 U/L   68   AST 15 - 41 U/L   21   ALT 0 - 44 U/L   17      Imaging studies:   CT Abdomen/Pelvis (11/08/2021) personally reviewed mild inflammation surrounding the distal appendix concerning for appendicitis, no free air, no abscess, and radiologist report reviewed below:  IMPRESSION 1. CT findings consistent with acute appendicitis. 2. Significant age advanced atherosclerotic calcifications involving the aorta and iliac arteries and branch vessels   Assessment/Plan: (ICD-10's: K4.80) 42 y.o. male with acute uncomplicated appendicitis, complicated by pertinent comorbidities including systolic CHF secondary to nonischemic cardiomyopathy from hypertensive heart disease, EF 55 to 60%, history of CVA 2021, CKD 3, poorly controlled DM, OSA.   - Greatly appreciate medicine admission and assistance with morbidities - Will plan on robotic assisted laparoscopic appendectomy with Dr Christian Mate pending OR/Anesthesia availability this afternoon  - All risks, benefits, and alternatives to above procedure(s) were discussed with the patient, all of his questions were answered to his expressed satisfaction, patient expresses he wishes to proceed, and informed consent was obtained.   - NPO + IVF Support  - IV Abx (Zosyn) - Monitor abdominal examination; on-going bowel function - Pain control prn; antiemetics prn  All of the above findings and recommendations were discussed with the patient, and all of patient's questions were answered to his expressed satisfaction.  Thank you for the opportunity to participate in this patient's care.   -- Edison Simon, PA-C [facility] 11/08/2021, 7:58 AM M-F: 7am - 4pm

## 2021-11-08 NOTE — H&P (View-Only) (Signed)
[facility] SURGICAL CONSULTATION NOTE (initial) - cpt: 99244   HISTORY OF PRESENT ILLNESS (HPI):  41 y.o. male presented to [facility] ED yesterday for evaluation of nausea. Patient reports he has been having milder abdominal pain since last Tuesday. This seemed to be localized on his right side. Worsening in the last 24-48 hours with accompanying nausea, emesis, and inability to tolerate PO. No fever, chills, cough, CP, SOB, urinary changes, or bowel changes. No previous intra-abdominal procedures. Work up in the ED revealed acute on chronic kidney injury with sCr 2.43 (now 2.02 - baseline), hypokalemia to 2.9 (now 3.1), normal WBC at 6.0K, Hgb 17.1, normal lactic acid level at 1.6. He did have CT Abdomen/pelvis concerning for acute uncomplicated appendicitis. He was admitted to medicine service given his comorbidities. Started on Zosyn.   Surgery is consulted by emergency medicine physician Dr. Conni Slipper, MD in this context for evaluation and management of acute appendicitis in setting of uncontrolled DM, AKI, and hypokalemia amongst other comorbid conditions.    PAST MEDICAL HISTORY (PMH):  Past Medical History:  Diagnosis Date   Anxiety    Chest pain    Chronic diastolic CHF (congestive heart failure) (HCC)    Constipation    Depression    GERD (gastroesophageal reflux disease)    Hyperlipidemia    Hypertension    Hypertensive heart disease    Lactose intolerance    Noncompliance with medications    Obesity    Palpitations    Renal disorder    Shortness of breath on exertion    Sleep apnea    Stroke ([place])    Systolic dysfunction    a. TTE 06/2014: EF 45-50%, normal wall motion, mild MR, mild AI     PAST SURGICAL HISTORY ([place]):  Past Surgical History:  Procedure Laterality Date   NO PAST SURGERIES       MEDICATIONS:  Prior to Admission medications   Medication Sig Start Date End Date Taking? Authorizing Provider  Alirocumab (PRALUENT) 75 MG/ML SOAJ Inject 1  Pen into the skin every 14 (fourteen) days. 09/20/21  Yes Minna Merritts, MD  amLODipine (NORVASC) 10 MG tablet Take 1 tablet by mouth once daily 10/16/21  Yes Gollan, Kathlene November, MD  carvedilol (COREG) 25 MG tablet Take 1 tablet (25 mg total) by mouth 2 (two) times daily with a meal. 10/10/20  Yes Gollan, Kathlene November, MD  clopidogrel (PLAVIX) 75 MG tablet Take 1 tablet (75 mg total) by mouth daily. 10/10/20  Yes Gollan, Kathlene November, MD  escitalopram (LEXAPRO) 10 MG tablet Take 15 mg by mouth daily. 12/08/20  Yes [provider]  ezetimibe (ZETIA) 10 MG tablet Take 1 tablet (10 mg total) by mouth daily. 09/20/21  Yes Gollan, Kathlene November, MD  JARDIANCE 25 MG TABS tablet Take 25 mg by mouth daily. 11/05/21  Yes [provider]  montelukast (SINGULAIR) 10 MG tablet TAKE 1 TABLET BY MOUTH AT BEDTIME 10/17/21  Yes Karamalegos, Devonne Doughty, DO  omeprazole (PRILOSEC) 20 MG capsule Take 20 mg by mouth daily.   Yes [provider]  OZEMPIC, 2 MG/DOSE, 8 MG/3ML SOPN Inject 2 mg into the skin once a week. 10/17/21  Yes [provider]  rosuvastatin (CRESTOR) 40 MG tablet Take 1 tablet (40 mg total) by mouth daily. 09/20/21  Yes Minna Merritts, MD  topiramate (TOPAMAX) 100 MG tablet Take 100 mg by mouth at bedtime. 07/04/21  Yes [provider]  TRESIBA FLEXTOUCH 100 UNIT/ML FlexTouch Pen  Inject 22 Units into the skin daily. 08/09/21  Yes [provider]  cyclobenzaprine (FLEXERIL) 10 MG tablet TAKE 1 TABLET BY MOUTH TWICE DAILY AS NEEDED FOR MUSCLE SPASM 10/17/21   Parks Ranger, Devonne Doughty, DO  furosemide (LASIX) 20 MG tablet Take 1 tablet (20 mg total) by mouth daily as needed. For shortness of breath 01/15/21   Minna Merritts, MD  ondansetron (ZOFRAN-ODT) 4 MG disintegrating tablet Take 1 tablet (4 mg total) by mouth every 8 (eight) hours as needed for nausea or vomiting. 11/06/21   Parks Ranger, Devonne Doughty, DO  promethazine (PHENERGAN) 25 MG tablet Take 25 mg by mouth  every 4 (four) hours as needed. 11/05/21   [provider]     ALLERGIES:  Allergies  Allergen Reactions   Bidil [Isosorb Dinitrate-Hydralazine] Other (See Comments)    Severe headache   Nitroglycerin Other (See Comments)    Causes severe headaches     SOCIAL HISTORY:  Social History   Socioeconomic History   Marital status: Married    Spouse name: Novah Goza   Number of children: 1   Years of education: 12   Highest education level: 12th grade  Occupational History   Occupation: Disabled  Tobacco Use   Smoking status: Never   Smokeless tobacco: Never  Vaping Use   Vaping Use: Never used  Substance and Sexual Activity   Alcohol use: Not Currently    Comment: Very infrequently; ~2-3 drinks/year    Drug use: No   Sexual activity: Not Currently    Birth control/protection: None  Other Topics Concern   Not on file  Social History Narrative   Not on file   Social Determinants of Health   Financial Resource Strain: Medium Risk (10/09/2021)   Overall Financial Resource Strain (CARDIA)    Difficulty of Paying Living Expenses: Somewhat hard  Food Insecurity: No Food Insecurity (11/08/2021)   Hunger Vital Sign    Worried About Running Out of Food in the Last Year: Never true    Ran Out of Food in the Last Year: Never true  Transportation Needs: No Transportation Needs (11/08/2021)   PRAPARE - Hydrologist (Medical): No    Lack of Transportation (Non-Medical): No  Physical Activity: Inactive (10/03/2020)   Exercise Vital Sign    Days of Exercise per Week: 0 days    Minutes of Exercise per Session: 0 min  Stress: No Stress Concern Present (10/03/2020)   [place]    Feeling of Stress : Not at all  Social Connections: Moderately Isolated (10/09/2021)   Social Connection and Isolation Panel [NHANES]    Frequency of Communication with Friends and Family: Twice a week     Frequency of Social Gatherings with Friends and Family: Once a week    Attends Religious Services: Never    Marine scientist or Organizations: No    Attends Archivist Meetings: Never    Marital Status: Married  Human resources officer Violence: Not At Risk (10/09/2021)   Humiliation, Afraid, Rape, and Kick questionnaire    Fear of Current or Ex-Partner: No    Emotionally Abused: No    Physically Abused: No    Sexually Abused: No     FAMILY HISTORY:  Family History  Problem Relation Age of Onset   Cancer Mother    Hypertension Mother    Alcoholism Father    Hyperlipidemia Father    Diabetes Father  CAD Father        a. stenting in his early 56s   Heart disease Father    Stroke Father    Heart attack Father    Diabetes Maternal Grandmother    Diabetes Maternal Grandfather    CAD Paternal Grandmother    CAD Paternal Grandfather    Colon cancer Paternal Grandfather    Diabetes Paternal Uncle       REVIEW OF SYSTEMS:  Review of Systems  Constitutional:  Negative for chills and fever.       + Decrease PO Intake   Respiratory:  Negative for cough and shortness of breath.   Cardiovascular:  Negative for chest pain and palpitations.  Gastrointestinal:  Positive for abdominal pain, nausea and vomiting. Negative for blood in stool, constipation and diarrhea.  Genitourinary:  Negative for dysuria and urgency.  All other systems reviewed and are negative.   VITAL SIGNS:  Temp:  [98.3 F (36.8 C)-99.3 F (37.4 C)] 98.3 F (36.8 C) (09/28 0321) Pulse Rate:  [103-136] 107 (09/28 0321) Resp:  [10-24] 16 (09/28 0034) BP: (121-158)/(91-117) 149/110 (09/28 0321) SpO2:  [97 %-100 %] 97 % (09/28 0321) Weight:  [111.1 kg] 111.1 kg (09/27 1540)     Height: 5\' 7"  (170.2 cm) Weight: 111.1 kg BMI (Calculated): 38.36   INTAKE/OUTPUT:  09/27 0701 - 09/28 0700 In: 3738.3 [I.V.:289.7; IV Piggyback:3448.5] Out: 400 [Urine:400]  PHYSICAL EXAM:  Physical Exam Vitals and  nursing note reviewed. Exam conducted with a chaperone present.  Constitutional:      General: He is not in acute distress.    Appearance: Normal appearance. He is not ill-appearing.     Comments: Patient resting in bed, appears more comfortable, NAD  HENT:     Head: Normocephalic and atraumatic.  Eyes:     General: No scleral icterus.    Conjunctiva/sclera: Conjunctivae normal.  Cardiovascular:     Rate and Rhythm: Normal rate.     Pulses: Normal pulses.     Heart sounds: No murmur heard. Pulmonary:     Effort: Pulmonary effort is normal. No respiratory distress.  Abdominal:     General: Abdomen is flat. There is no distension.     Palpations: Abdomen is soft.     Tenderness: There is abdominal tenderness in the right lower quadrant. There is no guarding or rebound. Negative signs include Rovsing's sign and McBurney's sign.     Comments: Abdomen is soft, he reports soreness with deep palpation in RLQ, non-distended, no rebound/guarding.   Genitourinary:    Comments: Deferred Musculoskeletal:     Right lower leg: No edema.     Left lower leg: No edema.  Skin:    General: Skin is warm and dry.  Neurological:     General: No focal deficit present.     Mental Status: He is alert and oriented to person, place, and time.  Psychiatric:        Mood and Affect: Mood normal.        Behavior: Behavior normal.      Labs:     Latest Ref Rng & Units 11/08/2021    3:35 AM 11/07/2021    3:47 PM 07/25/2020    4:28 PM  CBC  WBC 4.0 - 10.5 K/uL 6.7  6.0  4.3   Hemoglobin 13.0 - 17.0 g/dL 14.2  17.1  14.6   Hematocrit 39.0 - 52.0 % 42.5  52.2  44.0   Platelets 150 - 400 K/uL 202  238  224       Latest Ref Rng & Units 11/08/2021    3:35 AM 11/07/2021    8:55 PM 11/07/2021    3:47 PM  CMP  Glucose 70 - 99 mg/dL 93  95  117   BUN 6 - 20 mg/dL 16  17  19    Creatinine 0.61 - 1.24 mg/dL 2.02  2.07  2.43   Sodium 135 - 145 mmol/L 142  141  141   Potassium 3.5 - 5.1 mmol/L 3.1  2.8  2.9    Chloride 98 - 111 mmol/L 105  101  97   CO2 22 - 32 mmol/L 26  27  28    Calcium 8.9 - 10.3 mg/dL 8.5  8.5  9.8   Total Protein 6.5 - 8.1 g/dL   8.7   Total Bilirubin 0.3 - 1.2 mg/dL   1.4   Alkaline Phos 38 - 126 U/L   68   AST 15 - 41 U/L   21   ALT 0 - 44 U/L   17      Imaging studies:   CT Abdomen/Pelvis (11/08/2021) personally reviewed mild inflammation surrounding the distal appendix concerning for appendicitis, no free air, no abscess, and radiologist report reviewed below:  IMPRESSION 1. CT findings consistent with acute appendicitis. 2. Significant age advanced atherosclerotic calcifications involving the aorta and iliac arteries and branch vessels   Assessment/Plan: (ICD-10's: K39.80) 41 y.o. male with acute uncomplicated appendicitis, complicated by pertinent comorbidities including systolic CHF secondary to nonischemic cardiomyopathy from hypertensive heart disease, EF 55 to 60%, history of CVA 2021, CKD 3, poorly controlled DM, OSA.   - Greatly appreciate medicine admission and assistance with morbidities - Will plan on robotic assisted laparoscopic appendectomy with Dr Christian Mate pending OR/Anesthesia availability this afternoon  - All risks, benefits, and alternatives to above procedure(s) were discussed with the patient, all of his questions were answered to his expressed satisfaction, patient expresses he wishes to proceed, and informed consent was obtained.   - NPO + IVF Support  - IV Abx (Zosyn) - Monitor abdominal examination; on-going bowel function - Pain control prn; antiemetics prn  All of the above findings and recommendations were discussed with the patient, and all of patient's questions were answered to his expressed satisfaction.  Thank you for the opportunity to participate in this patient's care.   -- Edison Simon, PA-C [facility] 11/08/2021, 7:58 AM M-F: 7am - 4pm

## 2021-11-08 NOTE — Interval H&P Note (Signed)
History and Physical Interval Note:  11/08/2021 4:22 PM  Joel [name]  has presented today for surgery, with the diagnosis of acute appendicitis.  The various methods of treatment have been discussed with the patient and family. After consideration of risks, benefits and other options for treatment, the patient has consented to  Procedure(s): XI ROBOTIC LAPAROSCOPIC ASSISTED APPENDECTOMY (N/A) as a surgical intervention.  The patient's history has been reviewed, patient examined, no change in status, stable for surgery.  I have reviewed the patient's chart and labs.  Questions were answered to the patient's satisfaction.     Ronny Bacon

## 2021-11-08 NOTE — Progress Notes (Signed)
PROGRESS NOTE    Joel [name]  [id number] DOB: [birth year] DOA: 11/07/2021 PCP: [name], DO   Brief Narrative:  This 41 years old male with PMH significant for systolic CHF secondary to nonischemic cardiomyopathy from hypertensive heart disease, with improved LVEF 55 to 60% with grade 1 diastolic dysfunction, history of CVA in 2021, CKD stage IIIa, poorly controlled diabetes with last hemoglobin A1c 11.3, sleep apnea on CPAP presented in the ED with one week history of nonbloody nonbilious vomiting and lower abdominal pain, he was unable to take his medication for whole week because of vomiting.  He denies any fever and chills.  Work-up in the ED reveals acute appendicitis.  Patient is admitted for acute appendicitis and general surgery is consulted and is scheduled to have laparoscopic appendectomy today.  Assessment & Plan:   Principal Problem:   Acute appendicitis Active Problems:   Severe sepsis (HCC)   Acute renal failure superimposed on stage 3a chronic kidney disease (HCC)   Hypokalemia   Systolic CHF, chronic (HCC)   Essential hypertension   Type 2 diabetes mellitus with diabetic nephropathy (HCC)   History of stroke   Sleep apnea  Possible sepsis secondary to acute appendicitis: Patient reports one week history of vomiting and lower abdominal pain. CT findings consistent with acute appendicitis. He was tachycardic to the 130s and tachypneic to 26 with AKI.  Lactic acid 1.6 General  surgery Dr. Christian Mate consulted. Patient is medically stabilized from his acute medical conditions. Patient is scheduled to have laparoscopic appendicectomy today. Continue IV Zosyn.  Continue bowel rest,  IV hydration.  Pain management, IV antiemetics and supportive care.   Hypokalemia: Secondary to losses from vomiting Replaced.  Continue to monitor   AKI on CKD stage IIIa: Secondary to ATN from dehydration from intractable vomiting related to acute  appendicitis. Patient has received 3 L of fluid bolus in the ED. Continue IV hydration, avoid nephrotoxic medications Serum creatinine slowly improving.   Obstructive sleep apnea: Continue CPAP at night.   History of CVA: Resume antiplatelet and statin after surgery.   Type 2 diabetes with diabetic nephropathy: Low-dose basal insulin as patient is NPO Sliding scale insulin   Essential hypertension: Blood pressure controlled Hydralazine IV as needed to keep systolic under 0000000 while n.p.o.  Chronic systolic CHF: Appears clinically euvolemic. Nonischemic cardiomyopathy from hypertensive heart disease, with improved EF 55 to 60% with G1 DD 07/2019, Monitor for decompensation in view of fluid boluses received in the ED and ongoing IV fluids Daily weights with intake and output monitoring Hold carvedilol and Lasix while NPO   DVT prophylaxis: Lovenox Code Status: Full code Family Communication: Wife at bedside Disposition Plan: Status is: Inpatient Remains inpatient appropriate because: Admitted for acute appendicitis undergoing laparoscopic appendicectomy today.    Consultants:  General surgery  Procedures: Laparoscopic APPENDICECTOMY.  Antimicrobials: IV Zosyn  Subjective: Patient was seen and examined at bedside.  Overnight events noted.   Patient reports having RLQ pain,  still feeling nauseous but denies any vomiting.  He is  scheduled to have laparoscopic appendicectomy today.  Objective: Vitals:   11/07/21 2248 11/08/21 0034 11/08/21 0321 11/08/21 0831  BP:  (!) 139/97 (!) 149/110 (!) 149/102  Pulse:  (!) 110 (!) 107 100  Resp:  16    Temp: 98.6 F (37 C) 99.3 F (37.4 C) 98.3 F (36.8 C) 98.2 F (36.8 C)  TempSrc: Oral Oral Oral Oral  SpO2:  99% 97% 98%  Weight:  Height:        Intake/Output Summary (Last 24 hours) at 11/08/2021 1354 Last data filed at 11/08/2021 0132 Gross per 24 hour  Intake 3738.27 ml  Output 400 ml  Net 3338.27 ml   Filed  Weights   11/07/21 1540  Weight: 111.1 kg    Examination:  General exam: Appears comfortable, not in any acute distress. Respiratory system: CTA bilaterally, respiratory effort normal, RR 15 Cardiovascular system: S1 & S2 heard, regular rate and rhythm, no murmur. Gastrointestinal system: Abdomen is soft, non distended, RQ tenderness+, BS+ Central nervous system: Alert and oriented x 3. No focal neurological deficits. Extremities: No edema, no cyanosis, no clubbing Skin: No rashes, lesions or ulcers Psychiatry: Judgement and insight appear normal. Mood & affect appropriate.     Data Reviewed: I have personally reviewed following labs and imaging studies  CBC: Recent Labs  Lab 11/07/21 1547 11/08/21 0335  WBC 6.0 6.7  HGB 17.1* 14.2  HCT 52.2* 42.5  MCV 79.7* 79.4*  PLT 238 [id number]   Basic Metabolic Panel: Recent Labs  Lab 11/07/21 1547 11/07/21 2055 11/08/21 0335  NA 141 141 142  K 2.9* 2.8* 3.1*  CL 97* 101 105  CO2 28 27 26   GLUCOSE 117* 95 93  BUN 19 17 16   CREATININE 2.43* 2.07* 2.02*  CALCIUM 9.8 8.5* 8.5*   GFR: Estimated Creatinine Clearance: 57.8 mL/min (A) (by C-G formula based on SCr of 2.02 mg/dL (H)). Liver Function Tests: Recent Labs  Lab 11/07/21 1547  AST 21  ALT 17  ALKPHOS 68  BILITOT 1.4*  PROT 8.7*  ALBUMIN 4.0   Recent Labs  Lab 11/07/21 1547  LIPASE 40   No results for input(s): "AMMONIA" in the last 168 hours. Coagulation Profile: Recent Labs  Lab 11/08/21 0335  INR 1.1   Cardiac Enzymes: No results for input(s): "CKTOTAL", "CKMB", "CKMBINDEX", "TROPONINI" in the last 168 hours. BNP (last 3 results) No results for input(s): "PROBNP" in the last 8760 hours. HbA1C: Recent Labs    11/07/21 2055  HGBA1C 5.7*   CBG: Recent Labs  Lab 11/07/21 1542 11/07/21 2334 11/08/21 0323 11/08/21 0853 11/08/21 1137  GLUCAP 124* 91 90 77 81   Lipid Profile: No results for input(s): "CHOL", "HDL", "LDLCALC", "TRIG", "CHOLHDL",  "LDLDIRECT" in the last 72 hours. Thyroid Function Tests: No results for input(s): "TSH", "T4TOTAL", "FREET4", "T3FREE", "THYROIDAB" in the last 72 hours. Anemia Panel: No results for input(s): "VITAMINB12", "FOLATE", "FERRITIN", "TIBC", "IRON", "RETICCTPCT" in the last 72 hours. Sepsis Labs: Recent Labs  Lab 11/07/21 2055 11/08/21 0013 11/08/21 0335  PROCALCITON  --   --  <0.10  LATICACIDVEN 1.6 0.9 0.9    No results found for this or any previous visit (from the past 240 hour(s)).   Radiology Studies: CT ABDOMEN PELVIS W CONTRAST  Result Date: 11/07/2021 CLINICAL DATA:  Abdominal pain. EXAM: CT ABDOMEN AND PELVIS WITH CONTRAST TECHNIQUE: Multidetector CT imaging of the abdomen and pelvis was performed using the standard protocol following bolus administration of intravenous contrast. RADIATION DOSE REDUCTION: This exam was performed according to the departmental dose-optimization program which includes automated exposure control, adjustment of the mA and/or kV according to patient size and/or use of iterative reconstruction technique. CONTRAST:  14mL OMNIPAQUE IOHEXOL 350 MG/ML SOLN COMPARISON:  None Available. FINDINGS: Lower chest: The lung bases are clear of acute process. No pleural effusion or pulmonary lesions. The heart is normal in size. No pericardial effusion. Small hiatal hernia. Hepatobiliary: No focal hepatic lesions  or intrahepatic biliary dilatation. The gallbladder is normal. No common bile duct dilatation. Pancreas: No mass, inflammation ductal dilatation. Spleen: Normal size.  No focal lesions.  Small accessory spleens. Adrenals/Urinary Tract: The adrenal glands are normal. No worrisome renal lesions, renal calculi or hydronephrosis. Bladder is unremarkable. Stomach/Bowel: The stomach, duodenum, small bowel and colon are unremarkable. The terminal ileum is normal. The appendix is thickened and inflamed. There is periappendiceal inflammatory changes. Findings consistent with  acute appendicitis. Vascular/Lymphatic: Significant age advanced atherosclerotic calcifications involving the aorta and iliac arteries and branch vessels. No aneurysm. The major venous structures are patent. No mesenteric or retroperitoneal mass or adenopathy. Reproductive: The prostate gland and seminal vesicles are unremarkable. Other: No pelvic mass or adenopathy. No free pelvic fluid collections. No inguinal mass or adenopathy. No abdominal wall hernia or subcutaneous lesions. Musculoskeletal: No significant bony findings. IMPRESSION: 1. CT findings consistent with acute appendicitis. 2. Significant age advanced atherosclerotic calcifications involving the aorta and iliac arteries and branch vessels. Electronically Signed   By: Marijo Sanes M.D.   On: 11/07/2021 18:53     Scheduled Meds:  amLODipine  5 mg Oral Daily   insulin aspart  0-15 Units Subcutaneous Q4H   [START ON 11/09/2021] insulin detemir  15 Units Subcutaneous QHS   pantoprazole (PROTONIX) IV  40 mg Intravenous Q24H   Continuous Infusions:  lactated ringers 125 mL/hr at 11/08/21 0902   piperacillin-tazobactam (ZOSYN)  IV 3.375 g (11/08/21 0320)     LOS: 1 day    Time spent: 50 mins    Storey Stangeland, MD [facility]   If 7PM-7AM, please contact night-coverage

## 2021-11-08 NOTE — Anesthesia Preprocedure Evaluation (Addendum)
Anesthesia Evaluation  Patient identified by MRN, date of birth, ID band Patient awake    Reviewed: Allergy & Precautions, NPO status , Patient's Chart, lab work & pertinent test results  History of Anesthesia Complications Negative for: history of anesthetic complications  Airway Mallampati: III  TM Distance: >3 FB Neck ROM: Full    Dental no notable dental hx. (+) Teeth Intact   Pulmonary sleep apnea and Continuous Positive Airway Pressure Ventilation , neg COPD, Patient abstained from smoking.Not current smoker,    Pulmonary exam normal breath sounds clear to auscultation       Cardiovascular Exercise Tolerance: Good METShypertension, Pt. on medications +CHF (NICM; EF 55-60%)  (-) CAD and (-) Past MI (-) dysrhythmias  Rhythm:Regular Rate:Normal - Systolic murmurs ECG 5/64/33:  Sinus tachycardia Right superior axis deviation Anterior infarct , age undetermined  1. Left ventricular ejection fraction, by visual estimation, is 55 to  60%. The left ventricle has normal function. There is severely increased  left ventricular hypertrophy.  2. Left ventricular diastolic parameters are consistent with Grade I  diastolic dysfunction (impaired relaxation).  3. The left ventricle has no regional wall motion abnormalities.  4. Global right ventricle has normal systolic function.The right  ventricular size is normal. No increase in right ventricular wall  thickness.  5. Left atrial size was normal.  6. Right atrial size was normal.  7. The mitral valve is grossly normal. No evidence of mitral valve  regurgitation.  8. The tricuspid valve is grossly normal.  9. The tricuspid valve is grossly normal. Tricuspid valve regurgitation  is mild.  10. The aortic valve is tricuspid. Aortic valve regurgitation is not  visualized. Mild aortic valve sclerosis without stenosis.  11. The pulmonic valve was grossly normal. Pulmonic valve  regurgitation is  not visualized.  12. The inferior vena cava is normal in size with greater than 50%  respiratory variability, suggesting right atrial pressure of 3 mmHg.    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 Patient's face appears somewhat assymetrical but patient and family assure that is just his normal face CVA (02/2019, on Plavix), Residual Symptoms    GI/Hepatic GERD  Poorly Controlled,(+)     (-) substance abuse  , On Ozempic   Endo/Other  diabetes, Poorly Controlled, Type 1, Insulin DependentObesity  Hgb A1c = 11.3  Renal/GU Renal disease (stage III CKD)     Musculoskeletal   Abdominal (+) + obese,  Abdomen: tender.    Peds  Hematology   Anesthesia Other Findings Cardiology note 07/17/21:  Dilated cardiomyopathy ([place]) EF has fluctuated 45 to 50% then dropped 25 to 30% in 2019 Ejection fraction back up to normal January 2021 holding off on Entresto ACE ARB given renal failure Stressed the importance of aggressive blood pressure control given likely hypertensive heart disease  systolic and diastolic CHF, chronic (HCC) -  Takes Lasix as needed, appears euvolemic Blood pressure well controlled, most recent echocardiogram with ejection fraction 55% No changes to his medications Stressed importance of aggressive diabetes control  Essential hypertension intolerance to BiDil, isosorbide Avoiding ACE, ARB, Entresto in the setting of renal failure Recommend he continue amlodipine, carvedilol  No changes to his medications today, recommended lifestyle modification, weight loss  History of stroke Residual right-sided deficits  cerebrovascular disease  on MRI/MRA  Plavix continued  on Repatha, Crestor Zetia Aggressive management of cholesterol  Type 1 diabetes mellitus with stage 3 chronic kidney disease (HCC) A1c remains elevated at  12, Difficulty losing weight Working with  endocrine  Hypertensive heart disease with heart failure (HCC) Blood pressure stable No changes to his medication regiment  Reproductive/Obstetrics                           Anesthesia Physical Anesthesia Plan  ASA: 3 and emergent  Anesthesia Plan: General   Post-op Pain Management: Dilaudid IV   Induction: Intravenous and Rapid sequence  PONV Risk Score and Plan: 4 or greater and Ondansetron, Dexamethasone and Midazolam  Airway Management Planned: Oral ETT  Additional Equipment: None  Intra-op Plan:   Post-operative Plan: Extubation in OR  Informed Consent: I have reviewed the patients History and Physical, chart, labs and discussed the procedure including the risks, benefits and alternatives for the proposed anesthesia with the patient or authorized representative who has indicated his/her understanding and acceptance.     Dental advisory given  Plan Discussed with: CRNA and Surgeon  Anesthesia Plan Comments: (Discussed risks of anesthesia with patient, including PONV, sore throat, lip/dental/eye damage, aspiration. Rare risks discussed as well, such as cardiorespiratory and neurological sequelae, and allergic reactions. Discussed the role of CRNA in patient's perioperative care. Patient understands.  Patient requesting something for reflux; will provide Bicitra.)       Anesthesia Quick Evaluation

## 2021-11-08 NOTE — Anesthesia Procedure Notes (Signed)
Procedure Name: Intubation Date/Time: 11/08/2021 4:42 PM  Performed by: Aline Brochure, CRNAPre-anesthesia Checklist: Patient identified, Patient being monitored, Timeout performed, Emergency Drugs available and Suction available Patient Re-evaluated:Patient Re-evaluated prior to induction Oxygen Delivery Method: Circle system utilized Preoxygenation: Pre-oxygenation with 100% oxygen Induction Type: IV induction Ventilation: Mask ventilation without difficulty Laryngoscope Size: 4 and McGraph Grade View: Grade I Tube type: Oral Tube size: 7.5 mm Number of attempts: 1 Airway Equipment and Method: Stylet and Video-laryngoscopy Placement Confirmation: ETT inserted through vocal cords under direct vision, positive ETCO2 and breath sounds checked- equal and bilateral Secured at: 23 cm Tube secured with: Tape Dental Injury: Teeth and Oropharynx as per pre-operative assessment

## 2021-11-08 NOTE — Op Note (Signed)
Robotic appendectomy  Pre-operative Diagnosis: Acute appendicitis  Post-operative Diagnosis: same.    Surgeon: Ronny Bacon, M.D., [facility]  Anesthesia: General endotracheal  Findings: Chronically appearing, scar appendix tethered to the retroperitoneum.  No hyperemic changes, no evidence of suppurative changes.  No gangrene or evidence of perforation or focal abscess.  Estimated Blood Loss: 15 mL         Specimens: Appendix          Complications: none              Procedure Details  The patient was seen again in the Holding Room. The benefits, complications, treatment options, and expected outcomes were discussed with the patient. The risks of bleeding, infection, recurrence of symptoms, failure to resolve symptoms, unanticipated injury, prosthetic placement, prosthetic infection, any of which could require further surgery were reviewed with the patient. The likelihood of improving the patient's symptoms with return to their baseline status is expected.  The patient and/or family concurred with the proposed plan, giving informed consent.  The patient was taken to Operating Room, identified and the procedure verified.    Prior to the induction of general anesthesia, antibiotic prophylaxis was administered. VTE prophylaxis was in place.  General anesthesia was then administered and tolerated well. After the induction, the patient was positioned in the supine position and the abdomen was prepped with  Chloraprep and draped in the sterile fashion.  A Time Out was held and the above information confirmed.   After local infiltration of quarter percent Marcaine with epinephrine, stab incision was made left upper quadrant.  Just below the costal margin approximately midclavicular line the Veress needle is passed with sensation of the layers to penetrate the abdominal wall and into the peritoneum.  Saline drop test is confirmed peritoneal placement.  Insufflation is initiated with carbon dioxide to  pressures of 15 mmHg.  With local anesthetic infiltration, a left lower quadrant incision is made, and an optical 11 mm trocar is passed into the peritoneal cavity under direct visualization.  3 additional 8.5 mm robotic trochars placed in the left abdominal wall under direct visualization. Using a Tips up, & force bipolar graspers with monopolar scissors I proceeded with dissecting out the soft tissues adjacent to the cecum and appendix to fully identify the appendix, and mobilize it to the cecal junction.  The mesoappendix was carefully divided utilizing bipolar cautery, monopolar cautery and scissors. With the appendiceal cecal junction fully isolated, we then undocked the robot and proceeded with completing the procedure laparoscopically. I used a 45 mm white load laparoscopic stapler to complete the division of the appendix at the cecum.  We then placed the appendix in a retrieval bag and withdrew it out the largest port site. We closed the largest port site utilizing PMI and cone with a 0 Vicryl under direct visualization.  The abdomen was then desufflated and the trochars removed. Incisions were then irrigated and closed with subcuticulars of 4-0 Monocryl.  Skin sealed with Dermabond.  Patient tolerated procedure well.    Ronny Bacon M.D., [facility] 11/08/2021 6:25 PM

## 2021-11-09 ENCOUNTER — Inpatient Hospital Stay: Payer: Medicare Other

## 2021-11-09 ENCOUNTER — Encounter: Payer: Self-pay | Admitting: Surgery

## 2021-11-09 DIAGNOSIS — K3580 Unspecified acute appendicitis: Secondary | ICD-10-CM | POA: Diagnosis not present

## 2021-11-09 LAB — BASIC METABOLIC PANEL
Anion gap: 9 (ref 5–15)
BUN: 15 mg/dL (ref 6–20)
CO2: 24 mmol/L (ref 22–32)
Calcium: 8.2 mg/dL — ABNORMAL LOW (ref 8.9–10.3)
Chloride: 105 mmol/L (ref 98–111)
Creatinine, Ser: 1.83 mg/dL — ABNORMAL HIGH (ref 0.61–1.24)
GFR, Estimated: 47 mL/min — ABNORMAL LOW (ref >60)
Glucose, Bld: 137 mg/dL — ABNORMAL HIGH (ref 70–99)
Potassium: 3.5 mmol/L (ref 3.5–5.1)
Sodium: 138 mmol/L (ref 135–145)

## 2021-11-09 LAB — CBC
HCT: 39.8 % (ref 39.0–52.0)
Hemoglobin: 13.3 g/dL (ref 13.0–17.0)
MCH: 26.5 pg (ref 26.0–34.0)
MCHC: 33.4 g/dL (ref 30.0–36.0)
MCV: 79.4 fL — ABNORMAL LOW (ref 80.0–100.0)
Platelets: 173 10*3/uL (ref 150–400)
RBC: 5.01 MIL/uL (ref 4.22–5.81)
RDW: 13.5 % (ref 11.5–15.5)
WBC: 10.4 10*3/uL (ref 4.0–10.5)
nRBC: 0 % (ref 0.0–0.2)

## 2021-11-09 LAB — GLUCOSE, CAPILLARY
Glucose-Capillary: 136 mg/dL — ABNORMAL HIGH (ref 70–99)
Glucose-Capillary: 140 mg/dL — ABNORMAL HIGH (ref 70–99)
Glucose-Capillary: 162 mg/dL — ABNORMAL HIGH (ref 70–99)
Glucose-Capillary: 177 mg/dL — ABNORMAL HIGH (ref 70–99)

## 2021-11-09 LAB — MAGNESIUM: Magnesium: 1.7 mg/dL (ref 1.7–2.4)

## 2021-11-09 LAB — PHOSPHORUS: Phosphorus: 1.9 mg/dL — ABNORMAL LOW (ref 2.5–4.6)

## 2021-11-09 MED ORDER — SENNOSIDES-DOCUSATE SODIUM 8.6-50 MG PO TABS
1.0000 | ORAL_TABLET | Freq: Every evening | ORAL | Status: DC | PRN
  Administered 2021-11-09 (×2): 1 via ORAL
  Filled 2021-11-09 (×2): qty 1

## 2021-11-09 MED ORDER — SIMETHICONE 80 MG PO CHEW
80.0000 mg | CHEWABLE_TABLET | Freq: Four times a day (QID) | ORAL | Status: DC | PRN
  Administered 2021-11-09 (×2): 80 mg via ORAL
  Filled 2021-11-09 (×2): qty 1

## 2021-11-09 MED ORDER — AMOXICILLIN-POT CLAVULANATE 875-125 MG PO TABS: 1.0000 | ORAL_TABLET | Freq: Two times a day (BID) | ORAL | 0 refills | Status: DC

## 2021-11-09 MED ORDER — OXYCODONE-ACETAMINOPHEN 5-325 MG PO TABS: 1.0000 | ORAL_TABLET | Freq: Four times a day (QID) | ORAL | 0 refills | Status: DC | PRN

## 2021-11-09 MED ORDER — POLYETHYLENE GLYCOL 3350 17 G PO PACK
17.0000 g | PACK | Freq: Every day | ORAL | Status: DC
  Administered 2021-11-09: 17 g via ORAL
  Filled 2021-11-09: qty 1

## 2021-11-09 NOTE — Care Management Important Message (Signed)
Important Message  Patient Details  Name: Joel [name] MRN: [id number] Date of Birth: 10/09/1980   Medicare Important Message Given:  Yes     Dannette Barbara 11/09/2021, 10:51 AM

## 2021-11-09 NOTE — Progress Notes (Signed)
Provider Notification: Edison Simon PA  Pt is complaining of very bad abdominal pain. He states it feels like gas. He also has not have a BM since 11/03/21. Could we get simethicone and Murelax order please.   Order received: -prn simethicone order  -schedule Miralax.

## 2021-11-09 NOTE — Discharge Summary (Signed)
Physician Discharge Summary  Joel [name] [id number] DOB: 17-Oct-1980 DOA: 11/07/2021  PCP: [name], DO  Admit date: 11/07/2021  Discharge date: 11/09/2021  Admitted From: Home Disposition: Home  Recommendations for Outpatient Follow-up:  Follow up with PCP in 1-2 weeks Please obtain BMP/CBC in one week Advised to follow-up with general surgery in 1 week.  Home Health:None Equipment/Devices:None  Discharge Condition: Stable CODE STATUS:Full code Diet recommendation: Heart Healthy   Brief [facility] Course: This 41 years old male with PMH significant for systolic CHF secondary to nonischemic cardiomyopathy from hypertensive heart disease, with improved LVEF 55 to 60% with grade 1 diastolic dysfunction, history of CVA in 2021, CKD stage IIIa, poorly controlled diabetes with last hemoglobin A1c 11.3, sleep apnea on CPAP presented in the ED with one week history of nonbloody nonbilious vomiting and lower abdominal pain, he was unable to take his medication for whole week because of vomiting.  He denies any fever and chills.  Work-up in the ED reveals acute appendicitis.  Patient was admitted for acute appendicitis and general surgery was consulted .  Patient underwent successful robotic assisted laparoscopic appendicectomy, tolerated well.  Postoperatively patient has developed some abdominal distention.  X-ray shows no obstruction or ileus.  Patient reports improvement in pain after giving MiraLAX.  Patient feels better and wants to be discharged. General surgery signed out.  Patient is being discharged home on Augmentin 875 twice a day for 7 days.  Discharge Diagnoses:  Principal Problem:   Acute appendicitis Active Problems:   Severe sepsis (HCC)   Acute renal failure superimposed on stage 3a chronic kidney disease (HCC)   Hypokalemia   Systolic CHF, chronic (HCC)   Essential hypertension   Type 2 diabetes mellitus with diabetic nephropathy (HCC)   History of  stroke   Sleep apnea  Possible sepsis secondary to acute appendicitis: Patient reports one week history of vomiting and lower abdominal pain. CT findings consistent with acute appendicitis. He was tachycardic to the 130s and tachypneic to 26 with AKI.  Lactic acid 1.6 General  surgery Dr. Christian Mate consulted. Patient is medically stabilized from his acute medical conditions. Patient underwent laparoscopic appendicectomy 9/28. Continue IV Zosyn.  Continue bowel rest,  IV hydration.  Pain management, IV antiemetics and supportive care. Patient is being discharged home on Augmentin for 7 days   Hypokalemia: Replaced and resolved.   AKI on CKD stage IIIa: Secondary to ATN from dehydration from intractable vomiting related to acute appendicitis. Patient has received 3 L of fluid bolus in the ED. Continue IV hydration, avoid nephrotoxic medications Serum creatinine slowly improving.   Obstructive sleep apnea: Continue CPAP at night.   History of CVA: Resume antiplatelet and statin after surgery.   Type 2 diabetes with diabetic nephropathy: Low-dose basal insulin as patient is NPO Sliding scale insulin   Essential hypertension: Blood pressure controlled Hydralazine IV as needed to keep systolic under 0000000 while n.p.o.   Chronic systolic CHF: Appears clinically euvolemic. Nonischemic cardiomyopathy from hypertensive heart disease, with improved EF 55 to 60% with G1 DD 07/2019, Monitor for decompensation in view of fluid boluses received in the ED and ongoing IV fluids Daily weights with intake and output monitoring Hold carvedilol and Lasix while NPO  Discharge Instructions  Discharge Instructions     Call MD for:  persistant dizziness or light-headedness   Complete by: As directed    Call MD for:  persistant nausea and vomiting   Complete by: As directed  Call MD for:  redness, tenderness, or signs of infection (pain, swelling, redness, odor or green/yellow discharge  around incision site)   Complete by: As directed    Diet - low sodium heart healthy   Complete by: As directed    Diet Carb Modified   Complete by: As directed    Discharge instructions   Complete by: As directed    Advised to follow-up with primary care physician in 1 week. Advised to follow-up with general surgery in 1 week.   Discharge wound care:   Complete by: As directed    Follow-up with general surgery in 1 week.   Increase activity slowly   Complete by: As directed       Allergies as of 11/09/2021       Reactions   Bidil [isosorb Dinitrate-hydralazine] Other (See Comments)   Severe headache   Nitroglycerin Other (See Comments)   Causes severe headaches        Medication List     STOP taking these medications    omeprazole 20 MG capsule Commonly known as: PRILOSEC       TAKE these medications    amLODipine 10 MG tablet Commonly known as: NORVASC Take 1 tablet by mouth once daily   amoxicillin-clavulanate 875-125 MG tablet Commonly known as: AUGMENTIN Take 1 tablet by mouth 2 (two) times daily.   carvedilol 25 MG tablet Commonly known as: COREG Take 1 tablet (25 mg total) by mouth 2 (two) times daily with a meal.   clopidogrel 75 MG tablet Commonly known as: PLAVIX Take 1 tablet (75 mg total) by mouth daily.   cyclobenzaprine 10 MG tablet Commonly known as: FLEXERIL TAKE 1 TABLET BY MOUTH TWICE DAILY AS NEEDED FOR MUSCLE SPASM   escitalopram 10 MG tablet Commonly known as: LEXAPRO Take 15 mg by mouth daily.   ezetimibe 10 MG tablet Commonly known as: ZETIA Take 1 tablet (10 mg total) by mouth daily.   furosemide 20 MG tablet Commonly known as: LASIX Take 1 tablet (20 mg total) by mouth daily as needed. For shortness of breath   Jardiance 25 MG Tabs tablet Generic drug: empagliflozin Take 25 mg by mouth daily.   montelukast 10 MG tablet Commonly known as: SINGULAIR TAKE 1 TABLET BY MOUTH AT BEDTIME   ondansetron 4 MG  disintegrating tablet Commonly known as: ZOFRAN-ODT Take 1 tablet (4 mg total) by mouth every 8 (eight) hours as needed for nausea or vomiting.   oxyCODONE-acetaminophen 5-325 MG tablet Commonly known as: PERCOCET/ROXICET Take 1 tablet by mouth every 6 (six) hours as needed for moderate pain.   Ozempic (2 MG/DOSE) 8 MG/3ML Sopn Generic drug: Semaglutide (2 MG/DOSE) Inject 2 mg into the skin once a week.   Praluent 75 MG/ML Soaj Generic drug: Alirocumab Inject 1 Pen into the skin every 14 (fourteen) days.   promethazine 25 MG tablet Commonly known as: PHENERGAN Take 25 mg by mouth every 4 (four) hours as needed.   rosuvastatin 40 MG tablet Commonly known as: CRESTOR Take 1 tablet (40 mg total) by mouth daily.   topiramate 100 MG tablet Commonly known as: TOPAMAX Take 100 mg by mouth at bedtime.   Evaristo Bury FlexTouch 100 UNIT/ML FlexTouch Pen Generic drug: insulin degludec Inject 22 Units into the skin daily.               Discharge Care Instructions  (From admission, onward)           Start     Ordered  11/09/21 0000  Discharge wound care:       Comments: Follow-up with general surgery in 1 week.   11/09/21 1308            Follow-up Information     Tylene Fantasia, PA-C. Schedule an appointment as soon as possible for a visit in 3 week(s).   Specialty: Physician Assistant Why: s/p lap appendectomy Contact information: [address] [place] [address] [phone]         [name], DO Follow up in 1 week(s).   Specialty: Family Medicine Contact information: [place] [address] [phone]         Minna Merritts, MD .   Specialty: Cardiology Contact information: [place] [address] [phone]                Allergies  Allergen Reactions   Bidil [Isosorb Dinitrate-Hydralazine] Other (See Comments)    Severe headache   Nitroglycerin Other (See  Comments)    Causes severe headaches    Consultations: General Surgery   Procedures/Studies: DG Abd 1 View  Result Date: 11/09/2021 CLINICAL DATA:  Abdominal pain EXAM: ABDOMEN - 1 VIEW COMPARISON:  None Available. FINDINGS: The bowel gas pattern is normal. No radio-opaque calculi or other significant radiographic abnormality are seen. Mild colonic stool burden. IMPRESSION: No finding to explain abdominal pain. Electronically Signed   By: Marin Roberts M.D.   On: 11/09/2021 11:42   CT ABDOMEN PELVIS W CONTRAST  Result Date: 11/07/2021 CLINICAL DATA:  Abdominal pain. EXAM: CT ABDOMEN AND PELVIS WITH CONTRAST TECHNIQUE: Multidetector CT imaging of the abdomen and pelvis was performed using the standard protocol following bolus administration of intravenous contrast. RADIATION DOSE REDUCTION: This exam was performed according to the departmental dose-optimization program which includes automated exposure control, adjustment of the mA and/or kV according to patient size and/or use of iterative reconstruction technique. CONTRAST:  93mL OMNIPAQUE IOHEXOL 350 MG/ML SOLN COMPARISON:  None Available. FINDINGS: Lower chest: The lung bases are clear of acute process. No pleural effusion or pulmonary lesions. The heart is normal in size. No pericardial effusion. Small hiatal hernia. Hepatobiliary: No focal hepatic lesions or intrahepatic biliary dilatation. The gallbladder is normal. No common bile duct dilatation. Pancreas: No mass, inflammation ductal dilatation. Spleen: Normal size.  No focal lesions.  Small accessory spleens. Adrenals/Urinary Tract: The adrenal glands are normal. No worrisome renal lesions, renal calculi or hydronephrosis. Bladder is unremarkable. Stomach/Bowel: The stomach, duodenum, small bowel and colon are unremarkable. The terminal ileum is normal. The appendix is thickened and inflamed. There is periappendiceal inflammatory changes. Findings consistent with acute appendicitis.  Vascular/Lymphatic: Significant age advanced atherosclerotic calcifications involving the aorta and iliac arteries and branch vessels. No aneurysm. The major venous structures are patent. No mesenteric or retroperitoneal mass or adenopathy. Reproductive: The prostate gland and seminal vesicles are unremarkable. Other: No pelvic mass or adenopathy. No free pelvic fluid collections. No inguinal mass or adenopathy. No abdominal wall hernia or subcutaneous lesions. Musculoskeletal: No significant bony findings. IMPRESSION: 1. CT findings consistent with acute appendicitis. 2. Significant age advanced atherosclerotic calcifications involving the aorta and iliac arteries and branch vessels. Electronically Signed   By: Marijo Sanes M.D.   On: 11/07/2021 18:53    Laparoscopic appendicectomy.  Subjective: Patient was seen and examined at bedside.  Overnight events noted.   Patient is s/p laparoscopic appendicectomy POD 1. Patient reports feeling better , able to pass flatus  and has tolerated soft diet.  Discharge Exam: Vitals:   11/09/21 0418 11/09/21 0749  BP:  (!) 160/99  Pulse: (!) 107 99  Resp:  (!) 21  Temp:  98.4 F (36.9 C)  SpO2: 95% 99%   Vitals:   11/08/21 2107 11/09/21 0417 11/09/21 0418 11/09/21 0749  BP: (!) 156/104 (!) 145/92  (!) 160/99  Pulse: (!) 103 (!) 110 (!) 107 99  Resp: 18 20  (!) 21  Temp: 97.7 F (36.5 C) 99 F (37.2 C)  98.4 F (36.9 C)  TempSrc: Oral Oral  Oral  SpO2: 96% 95% 95% 99%  Weight:      Height:        General: Pt is alert, awake, not in acute distress Cardiovascular: RRR, S1/S2 +, no rubs, no gallops Respiratory: CTA bilaterally, no wheezing, no rhonchi Abdominal: Soft, NT, ND, bowel sounds + laproscopic scars noted. Extremities: no edema, no cyanosis    The results of significant diagnostics from this hospitalization (including imaging, microbiology, ancillary and laboratory) are listed below for reference.     Microbiology: No results  found for this or any previous visit (from the past 240 hour(s)).   Labs: BNP (last 3 results) No results for input(s): "BNP" in the last 8760 hours. Basic Metabolic Panel: Recent Labs  Lab 11/07/21 1547 11/07/21 2055 11/08/21 0335 11/09/21 0744  NA 141 141 142 138  K 2.9* 2.8* 3.1* 3.5  CL 97* 101 105 105  CO2 28 27 26 24   GLUCOSE 117* 95 93 137*  BUN 19 17 16 15   CREATININE 2.43* 2.07* 2.02* 1.83*  CALCIUM 9.8 8.5* 8.5* 8.2*  MG  --   --   --  1.7  PHOS  --   --   --  1.9*   Liver Function Tests: Recent Labs  Lab 11/07/21 1547  AST 21  ALT 17  ALKPHOS 68  BILITOT 1.4*  PROT 8.7*  ALBUMIN 4.0   Recent Labs  Lab 11/07/21 1547  LIPASE 40   No results for input(s): "AMMONIA" in the last 168 hours. CBC: Recent Labs  Lab 11/07/21 1547 11/08/21 0335 11/09/21 0744  WBC 6.0 6.7 10.4  HGB 17.1* 14.2 13.3  HCT 52.2* 42.5 39.8  MCV 79.7* 79.4* 79.4*  PLT 238 202 173   Cardiac Enzymes: No results for input(s): "CKTOTAL", "CKMB", "CKMBINDEX", "TROPONINI" in the last 168 hours. BNP: Invalid input(s): "POCBNP" CBG: Recent Labs  Lab 11/08/21 2108 11/08/21 2359 11/09/21 0446 11/09/21 0751 11/09/21 1150  GLUCAP 105* 140* 177* 136* 162*   D-Dimer No results for input(s): "DDIMER" in the last 72 hours. Hgb A1c Recent Labs    11/07/21 2055  HGBA1C 5.7*   Lipid Profile No results for input(s): "CHOL", "HDL", "LDLCALC", "TRIG", "CHOLHDL", "LDLDIRECT" in the last 72 hours. Thyroid function studies No results for input(s): "TSH", "T4TOTAL", "T3FREE", "THYROIDAB" in the last 72 hours.  Invalid input(s): "FREET3" Anemia work up No results for input(s): "VITAMINB12", "FOLATE", "FERRITIN", "TIBC", "IRON", "RETICCTPCT" in the last 72 hours. Urinalysis    Component Value Date/Time   COLORURINE YELLOW (A) 11/07/2021 1547   APPEARANCEUR CLEAR (A) 11/07/2021 1547   LABSPEC 1.031 (H) 11/07/2021 1547   PHURINE 6.0 11/07/2021 1547   GLUCOSEU >=500 (A) 11/07/2021  1547   HGBUR SMALL (A) 11/07/2021 1547   BILIRUBINUR NEGATIVE 11/07/2021 1547   KETONESUR 80 (A) 11/07/2021 1547   PROTEINUR >=300 (A) 11/07/2021 1547   NITRITE NEGATIVE 11/07/2021 1547   LEUKOCYTESUR NEGATIVE 11/07/2021 1547  Sepsis Labs Recent Labs  Lab 11/07/21 1547 11/08/21 0335 11/09/21 0744  WBC 6.0 6.7 10.4   Microbiology No results found for this or any previous visit (from the past 240 hour(s)).   Time coordinating discharge: Over 30 minutes  SIGNED:   Shawna Clamp, MD  [facility] 11/09/2021, 4:12 PM Pager   If 7PM-7AM, please contact night-coverage [email or web address] Password [email or web address]

## 2021-11-09 NOTE — Anesthesia Postprocedure Evaluation (Signed)
Anesthesia Post Note  Patient: Joel [name]  Procedure(s) Performed: XI ROBOTIC LAPAROSCOPIC ASSISTED APPENDECTOMY (Abdomen)  Patient location during evaluation: PACU Anesthesia Type: General Level of consciousness: awake and alert Pain management: pain level controlled Vital Signs Assessment: post-procedure vital signs reviewed and stable Respiratory status: spontaneous breathing, nonlabored ventilation, respiratory function stable and patient connected to nasal cannula oxygen Cardiovascular status: blood pressure returned to baseline and stable Postop Assessment: no apparent nausea or vomiting Anesthetic complications: no   No notable events documented.   Last Vitals:  Vitals:   11/09/21 0417 11/09/21 0418  BP: (!) 145/92   Pulse: (!) 110 (!) 107  Resp: 20   Temp: 37.2 C   SpO2: 95% 95%    Last Pain:  Vitals:   11/09/21 0417  TempSrc: Oral  PainSc:                  Arita Miss

## 2021-11-09 NOTE — Progress Notes (Signed)
Discharge instructions discussed with  patient at bed side. No further questions  or concerns at this time. PIV removed with no complications. Patient [place] home with wife

## 2021-11-09 NOTE — Progress Notes (Signed)
Provider Notification: Dr Dwyane Dee  Pt is requesting an abdominal xray to make sure he does not have a ileus  Orders received:  Abdominal Xray was order.

## 2021-11-09 NOTE — Progress Notes (Signed)
[facility] Day(s): 2.   Post op day(s): 1 Day Post-Op.   Interval History:  Patient seen and examined No acute events or new complaints overnight.  Patient reports he is overall doing well aside from significant gas pains. He reports that he is able to pass some flatus but the "feeling builds back up quickly." Last BM was reportedly 09/23 but he states at home he goes every 3 days and requires multiple doses of Miralax No incisional pain this morning No fever, chills, nausea, emesis No new labs this morning He has tolerated CLD so far   Vital signs in last 24 hours: [min-max] current  Temp:  [97 F (36.1 C)-99 F (37.2 C)] 98.4 F (36.9 C) (09/29 0749) Pulse Rate:  [90-110] 99 (09/29 0749) Resp:  [16-22] 21 (09/29 0749) BP: (137-166)/(78-109) 160/99 (09/29 0749) SpO2:  [93 %-100 %] 99 % (09/29 0749)     Height: 5\' 7"  (170.2 cm) Weight: 111.1 kg BMI (Calculated): 38.36   Intake/Output last 2 shifts:  09/28 0701 - 09/29 0700 In: 1990.7 [I.V.:1540.2; IV Piggyback:450.5] Out: 700 [Urine:700]   Physical Exam:  Constitutional: alert, cooperative and no distress  Respiratory: breathing non-labored at rest  Cardiovascular: regular rate and sinus rhythm  Gastrointestinal: Soft, no incisional soreness, non-distended, no rebound/guarding Integumentary: Laparoscopic incisions are CDI with dermabond, no erythema or drainage   Labs:     Latest Ref Rng & Units 11/08/2021    3:35 AM 11/07/2021    3:47 PM 07/25/2020    4:28 PM  CBC  WBC 4.0 - 10.5 K/uL 6.7  6.0  4.3   Hemoglobin 13.0 - 17.0 g/dL 14.2  17.1  14.6   Hematocrit 39.0 - 52.0 % 42.5  52.2  44.0   Platelets 150 - 400 K/uL 202  238  224       Latest Ref Rng & Units 11/08/2021    3:35 AM 11/07/2021    8:55 PM 11/07/2021    3:47 PM  CMP  Glucose 70 - 99 mg/dL 93  95  117   BUN 6 - 20 mg/dL 16  17  19    Creatinine 0.61 - 1.24 mg/dL 2.02  2.07  2.43   Sodium 135 - 145 mmol/L  142  141  141   Potassium 3.5 - 5.1 mmol/L 3.1  2.8  2.9   Chloride 98 - 111 mmol/L 105  101  97   CO2 22 - 32 mmol/L 26  27  28    Calcium 8.9 - 10.3 mg/dL 8.5  8.5  9.8   Total Protein 6.5 - 8.1 g/dL   8.7   Total Bilirubin 0.3 - 1.2 mg/dL   1.4   Alkaline Phos 38 - 126 U/L   68   AST 15 - 41 U/L   21   ALT 0 - 44 U/L   17      Imaging studies: No new pertinent imaging studies   Assessment/Plan:  41 y.o. male 1 Day Post-Op s/p laparoscopic appendectomy for acute appendicitis   - Okay to advance diet - Continue IV Abx (Zosyn); Okay to do PO Augmentin at home to complete 7 days total (IV + PO) - Added bowel regimen  - Monitor abdominal examination - Pain control prn; antiemetics prn   - Mobilize   - Further management per primary service  - Discharge Planning: IF he is able to pass flatus and tolerate PO intake, I think it is reasonable he  can DC home this afternoon. I will update DC instructions and follow up   All of the above findings and recommendations were discussed with the patient, patient's family at bedside, and the medical team, and all of their questions were answered to their expressed satisfaction.  -- Lynden Oxford, PA-C [facility] 11/09/2021, 7:59 AM M-F: 7am - 4pm

## 2021-11-12 ENCOUNTER — Telehealth: Payer: Self-pay

## 2021-11-12 LAB — SURGICAL PATHOLOGY

## 2021-11-12 NOTE — Telephone Encounter (Signed)
Transition Care Management Follow-up Telephone Call Date of discharge and from where: [facility] 9/29 How have you been since you were released from the hospital? [place] Any questions or concerns? No  Items Reviewed: Did the pt receive and understand the discharge instructions provided? Yes  Medications obtained and verified? Yes  Other? No  Any new allergies since your discharge? No  Dietary orders reviewed? No Do you have support at home? Yes   Home Care and Equipment/Supplies: Were home health services ordered? no   Functional Questionnaire: (I = Independent and D = Dependent) ADLs: I  Bathing/Dressing- I  Meal Prep- I  Eating- I  Maintaining continence- I  Transferring/Ambulation- I  Managing Meds- I  Follow up appointments reviewed:  PCP Hospital f/u appt confirmed? Yes  Scheduled to see Dr.K on 10/6 @ 1:40. Are transportation arrangements needed? No  If their condition worsens, is the pt aware to call PCP or go to the Emergency Dept.? Yes Was the patient provided with contact information for the PCP's office or ED? Yes Was to pt encouraged to call back with questions or concerns? Yes

## 2021-11-14 ENCOUNTER — Ambulatory Visit: Payer: Self-pay

## 2021-11-14 DIAGNOSIS — K219 Gastro-esophageal reflux disease without esophagitis: Secondary | ICD-10-CM

## 2021-11-14 MED ORDER — PANTOPRAZOLE SODIUM 40 MG PO TBEC: 40.0000 mg | DELAYED_RELEASE_TABLET | Freq: Every day | ORAL | 2 refills | Status: DC

## 2021-11-14 NOTE — Telephone Encounter (Signed)
  Chief Complaint: medication assistance Symptoms: acid reflux Frequency: ongoing for several days Pertinent Negatives: NA Disposition: [] ED /[] Urgent Care (no appt availability in office) / [] Appointment(In office/virtual)/ []  [place] [facility]/ [] Home Care/ [] Refused Recommended Disposition /[] [place] [facility]/ [x]  Follow-up with PCP Additional Notes: spoke with Janett Billow, wife of pt. She states pt has bad flare up of GERD right now and he has tried Tums but not helping at all. Pt has ED fu appt on 11/16/21 with Dr. Raliegh Ip. Advised her I would send message back to see if pt can restart Omeprazole 20mg  and have nurse call her back. In mean time advised her that he can try Mylanta to see if relieves symptoms any. She verbalized understanding.   Summary: acid reflux meds   The hospital took pt off of his acid reflux meds and pt is experiencing bad acid reflux symptoms / pt wants to know if he can start retaking the medication / please advise     Reason for Disposition  [1] Caller has URGENT medicine question about med that PCP or specialist prescribed AND [2] triager unable to answer question  Answer Assessment - Initial Assessment Questions 1. NAME of MEDICINE: "What medicine(s) are you calling about?"     omeprazole 2. QUESTION: "What is your question?" (e.g., double dose of medicine, side effect)     Rx was dc'd at ED on 11/07/21 and pt having bad GERD 3. PRESCRIBER: "Who prescribed the medicine?" Reason: if prescribed by specialist, call should be referred to that group.     Dr. Raliegh Ip 4. SYMPTOMS: "Do you have any symptoms?" If Yes, ask: "What symptoms are you having?"  "How bad are the symptoms (e.g., mild, moderate, severe)     GERD  Protocols used: Medication Question Call-A-AH

## 2021-11-14 NOTE — Telephone Encounter (Signed)
Please notify patient:  I have sent new rx Pantoprazole (protonix) 40mg  daily before breakfast  He should discontinue Omeprazole, due to med interaction with Plavix.  Nobie Putnam, [employer] 11/14/2021, 11:29 AM

## 2021-11-14 NOTE — Telephone Encounter (Signed)
Secure my-chart message sent to patient about recommendations per Dr. Raliegh Ip.

## 2021-11-14 NOTE — Addendum Note (Signed)
Addended by: [name] on: 11/14/2021 11:29 AM   Modules accepted: Orders

## 2021-11-16 ENCOUNTER — Ambulatory Visit: Payer: Medicare Other | Admitting: Family Medicine

## 2021-11-26 ENCOUNTER — Encounter: Payer: Self-pay | Admitting: Family Medicine

## 2021-11-26 ENCOUNTER — Ambulatory Visit (INDEPENDENT_AMBULATORY_CARE_PROVIDER_SITE_OTHER): Payer: Medicare Other | Admitting: Family Medicine

## 2021-11-26 VITALS — BP 125/84 | HR 86 | Ht 67.0 in | Wt 257.0 lb

## 2021-11-26 DIAGNOSIS — N179 Acute kidney failure, unspecified: Secondary | ICD-10-CM | POA: Diagnosis not present

## 2021-11-26 DIAGNOSIS — N1831 Chronic kidney disease, stage 3a: Secondary | ICD-10-CM

## 2021-11-26 DIAGNOSIS — M792 Neuralgia and neuritis, unspecified: Secondary | ICD-10-CM | POA: Diagnosis not present

## 2021-11-26 DIAGNOSIS — Z9049 Acquired absence of other specified parts of digestive tract: Secondary | ICD-10-CM

## 2021-11-26 MED ORDER — GABAPENTIN 100 MG PO CAPS: ORAL_CAPSULE | ORAL | 1 refills | Status: DC

## 2021-11-26 NOTE — Patient Instructions (Addendum)
Thank you for coming to the office today.  4pm apt tomorrow w/ surgeons  [facility] [address] [place],  [place]  [address] Phone: [phone]  Start Gabapentin 100mg  capsules, take at night for 2-3 nights only, and then increase to 2 times a day for a few days, and then may increase to 3 times a day, it may make you drowsy, if helps significantly at night only, then you can increase instead to 3 capsules at night, instead of 3 times a day - In the future if needed, we can significantly increase the dose if tolerated well, some common doses are 300mg  three times a day up to 600mg  three times a day, usually it takes several weeks or months to get to higher doses   Please schedule a Follow-up Appointment to: Return if symptoms worsen or fail to improve.  If you have any other questions or concerns, please feel free to call the office or send a message through [place]. You may also schedule an earlier appointment if necessary.  Additionally, you may be receiving a survey about your experience at our office within a few days to 1 week by e-mail or mail. We value your feedback.  Nobie Putnam, DO [place]

## 2021-11-26 NOTE — Progress Notes (Signed)
Subjective:    Patient ID: Joel [name], male    DOB: 10/13/1980, 41 y.o.   MRN: [id number]  Joel [name] is a 41 y.o. male presenting on 11/26/2021 for Hospitalization Follow-up and Abdominal Pain (Near incision spot, he says it feels like something is poking him. Sharp pain since the surgery. )   HPI  HOSPITAL FOLLOW-UP VISIT  Hospital/Location: [place] Date of Admission: 11/07/21 Date of Discharge: 11/09/21 Transitions of care telephone call: 11/12/21 transitions care call made by Kirke Shaggy LPN  Reason for Admission: nausea vomiting abdominal pain  - Hospital H&P and Discharge Summary have been reviewed - Patient presents today 17 days after recent hospitalization. Brief summary of recent course, patient had symptoms of acute abdominal pain nausea vomiting for 1-2 weeks, hospitalized, dx with acute appendicitis treated with surgery.  New problem with skin sensitivity issue and stabbing pain not near incision site. Skin itself feels on fire and burning, and deeper has a sharper   - Today reports overall has done well after discharge. Symptoms of appendicitis have resolved. Digestion improved.  - New medications on discharge: opiate PRN  I have reviewed the discharge medication list, and have reconciled the current and discharge medications today.   Current Outpatient Medications:    Alirocumab (PRALUENT) 75 MG/ML SOAJ, Inject 1 Pen into the skin every 14 (fourteen) days., Disp: 6 mL, Rfl: 3   amLODipine (NORVASC) 10 MG tablet, Take 1 tablet by mouth once daily, Disp: 90 tablet, Rfl: 0   carvedilol (COREG) 25 MG tablet, Take 1 tablet (25 mg total) by mouth 2 (two) times daily with a meal., Disp: 180 tablet, Rfl: 3   clopidogrel (PLAVIX) 75 MG tablet, Take 1 tablet (75 mg total) by mouth daily., Disp: 90 tablet, Rfl: 3   cyclobenzaprine (FLEXERIL) 10 MG tablet, TAKE 1 TABLET BY MOUTH TWICE DAILY AS NEEDED FOR MUSCLE SPASM, Disp: 30 tablet, Rfl: 3   escitalopram (LEXAPRO) 10 MG  tablet, Take 15 mg by mouth daily., Disp: , Rfl:    ezetimibe (ZETIA) 10 MG tablet, Take 1 tablet (10 mg total) by mouth daily., Disp: 90 tablet, Rfl: 3   furosemide (LASIX) 20 MG tablet, Take 1 tablet (20 mg total) by mouth daily as needed. For shortness of breath, Disp: 90 tablet, Rfl: 3   gabapentin (NEURONTIN) 100 MG capsule, Start 1 capsule daily, increase by 1 cap every 2-3 days as tolerated up to 3 times a day, or may take 3 at once in evening., Disp: 90 capsule, Rfl: 1   JARDIANCE 25 MG TABS tablet, Take 25 mg by mouth daily., Disp: , Rfl:    montelukast (SINGULAIR) 10 MG tablet, TAKE 1 TABLET BY MOUTH AT BEDTIME, Disp: 90 tablet, Rfl: 0   ondansetron (ZOFRAN-ODT) 4 MG disintegrating tablet, Take 1 tablet (4 mg total) by mouth every 8 (eight) hours as needed for nausea or vomiting., Disp: 30 tablet, Rfl: 0   oxyCODONE-acetaminophen (PERCOCET/ROXICET) 5-325 MG tablet, Take 1 tablet by mouth every 6 (six) hours as needed for moderate pain., Disp: 10 tablet, Rfl: 0   OZEMPIC, 2 MG/DOSE, 8 MG/3ML SOPN, Inject 2 mg into the skin once a week., Disp: , Rfl:    pantoprazole (PROTONIX) 40 MG tablet, Take 1 tablet (40 mg total) by mouth daily before breakfast., Disp: 30 tablet, Rfl: 2   promethazine (PHENERGAN) 25 MG tablet, Take 25 mg by mouth every 4 (four) hours as needed., Disp: , Rfl:    rosuvastatin (CRESTOR) 40 MG  tablet, Take 1 tablet (40 mg total) by mouth daily., Disp: 90 tablet, Rfl: 3   topiramate (TOPAMAX) 100 MG tablet, Take 100 mg by mouth at bedtime., Disp: , Rfl:    TRESIBA FLEXTOUCH 100 UNIT/ML FlexTouch Pen, Inject 22 Units into the skin daily., Disp: , Rfl:   ------------------------------------------------------------------------- Social History   Tobacco Use   Smoking status: Never   Smokeless tobacco: Never  Vaping Use   Vaping Use: Never used  Substance Use Topics   Alcohol use: Not Currently    Comment: Very infrequently; ~2-3 drinks/year    Drug use: No    Review  of Systems Per HPI unless specifically indicated above     Objective:    BP 125/84   Pulse 86   Ht 5\' 7"  (1.702 m)   Wt 257 lb (116.6 kg)   SpO2 100%   BMI 40.25 kg/m   Wt Readings from Last 3 Encounters:  11/26/21 257 lb (116.6 kg)  11/07/21 245 lb (111.1 kg)  08/29/21 284 lb (128.8 kg)    Physical Exam Vitals and nursing note reviewed.  Constitutional:      General: He is not in acute distress.    Appearance: Normal appearance. He is well-developed. He is obese. He is not diaphoretic.     Comments: Well-appearing, comfortable, cooperative  HENT:     Head: Normocephalic and atraumatic.  Eyes:     General:        Right eye: No discharge.        Left eye: No discharge.     Conjunctiva/sclera: Conjunctivae normal.  Cardiovascular:     Rate and Rhythm: Normal rate.  Pulmonary:     Effort: Pulmonary effort is normal.  Skin:    General: Skin is warm and dry.     Findings: No erythema or rash.     Comments: Post op surgical lap surgery sites clean dry intact, no obvious incisional complication  Localized lower mid abdomen under skin fold with hypersensitivity and pain reaction, tender to palpation  Neurological:     Mental Status: He is alert and oriented to person, place, and time.  Psychiatric:        Mood and Affect: Mood normal.        Behavior: Behavior normal.        Thought Content: Thought content normal.     Comments: Well groomed, good eye contact, normal speech and thoughts       Results for orders placed or performed during the hospital encounter of 11/07/21  Lipase, blood  Result Value Ref Range   Lipase 40 11 - 51 U/L  Comprehensive metabolic panel  Result Value Ref Range   Sodium 141 135 - 145 mmol/L   Potassium 2.9 (L) 3.5 - 5.1 mmol/L   Chloride 97 (L) 98 - 111 mmol/L   CO2 28 22 - 32 mmol/L   Glucose, Bld 117 (H) 70 - 99 mg/dL   BUN 19 6 - 20 mg/dL   Creatinine, Ser 2.43 (H) 0.61 - 1.24 mg/dL   Calcium 9.8 8.9 - 10.3 mg/dL   Total Protein  8.7 (H) 6.5 - 8.1 g/dL   Albumin 4.0 3.5 - 5.0 g/dL   AST 21 15 - 41 U/L   ALT 17 0 - 44 U/L   Alkaline Phosphatase 68 38 - 126 U/L   Total Bilirubin 1.4 (H) 0.3 - 1.2 mg/dL   GFR, Estimated 34 (L) >60 mL/min   Anion gap 16 (H) 5 -  15  CBC  Result Value Ref Range   WBC 6.0 4.0 - 10.5 K/uL   RBC 6.55 (H) 4.22 - 5.81 MIL/uL   Hemoglobin 17.1 (H) 13.0 - 17.0 g/dL   HCT 52.2 (H) 39.0 - 52.0 %   MCV 79.7 (L) 80.0 - 100.0 fL   MCH 26.1 26.0 - 34.0 pg   MCHC 32.8 30.0 - 36.0 g/dL   RDW 13.9 11.5 - 15.5 %   Platelets 238 150 - 400 K/uL   nRBC 0.0 0.0 - 0.2 %  Urinalysis, Routine w reflex microscopic Urine, Clean Catch  Result Value Ref Range   Color, Urine YELLOW (A) YELLOW   APPearance CLEAR (A) CLEAR   Specific Gravity, Urine 1.031 (H) 1.005 - 1.030   pH 6.0 5.0 - 8.0   Glucose, UA >=500 (A) NEGATIVE mg/dL   Hgb urine dipstick SMALL (A) NEGATIVE   Bilirubin Urine NEGATIVE NEGATIVE   Ketones, ur 80 (A) NEGATIVE mg/dL   Protein, ur >=300 (A) NEGATIVE mg/dL   Nitrite NEGATIVE NEGATIVE   Leukocytes,Ua NEGATIVE NEGATIVE   RBC / HPF 0-5 0 - 5 RBC/hpf   WBC, UA 0-5 0 - 5 WBC/hpf   Bacteria, UA RARE (A) NONE SEEN   Squamous Epithelial / LPF NONE SEEN 0 - 5   Mucus PRESENT   Beta-hydroxybutyric acid  Result Value Ref Range   Beta-Hydroxybutyric Acid 4.28 (H) 0.05 - 0.27 mmol/L  Basic metabolic panel  Result Value Ref Range   Sodium 141 135 - 145 mmol/L   Potassium 2.8 (L) 3.5 - 5.1 mmol/L   Chloride 101 98 - 111 mmol/L   CO2 27 22 - 32 mmol/L   Glucose, Bld 95 70 - 99 mg/dL   BUN 17 6 - 20 mg/dL   Creatinine, Ser 2.07 (H) 0.61 - 1.24 mg/dL   Calcium 8.5 (L) 8.9 - 10.3 mg/dL   GFR, Estimated 41 (L) >60 mL/min   Anion gap 13 5 - 15  Lactic acid, plasma  Result Value Ref Range   Lactic Acid, Venous 1.6 0.5 - 1.9 mmol/L  Hemoglobin A1c  Result Value Ref Range   Hgb A1c MFr Bld 5.7 (H) 4.8 - 5.6 %   Mean Plasma Glucose 116.89 mg/dL  Protime-INR  Result Value Ref Range    Prothrombin Time 14.1 11.4 - 15.2 seconds   INR 1.1 0.8 - 1.2  Cortisol-am, blood  Result Value Ref Range   Cortisol - AM 5.8 (L) 6.7 - 22.6 ug/dL  Procalcitonin  Result Value Ref Range   Procalcitonin <0.10 ng/mL  Basic metabolic panel  Result Value Ref Range   Sodium 142 135 - 145 mmol/L   Potassium 3.1 (L) 3.5 - 5.1 mmol/L   Chloride 105 98 - 111 mmol/L   CO2 26 22 - 32 mmol/L   Glucose, Bld 93 70 - 99 mg/dL   BUN 16 6 - 20 mg/dL   Creatinine, Ser 2.02 (H) 0.61 - 1.24 mg/dL   Calcium 8.5 (L) 8.9 - 10.3 mg/dL   GFR, Estimated 42 (L) >60 mL/min   Anion gap 11 5 - 15  CBC  Result Value Ref Range   WBC 6.7 4.0 - 10.5 K/uL   RBC 5.35 4.22 - 5.81 MIL/uL   Hemoglobin 14.2 13.0 - 17.0 g/dL   HCT 42.5 39.0 - 52.0 %   MCV 79.4 (L) 80.0 - 100.0 fL   MCH 26.5 26.0 - 34.0 pg   MCHC 33.4 30.0 - 36.0 g/dL  RDW 13.8 11.5 - 15.5 %   Platelets 202 150 - 400 K/uL   nRBC 0.0 0.0 - 0.2 %  HIV Antibody (routine testing w rflx)  Result Value Ref Range   HIV Screen 4th Generation wRfx Non Reactive Non Reactive  Lactic acid, plasma  Result Value Ref Range   Lactic Acid, Venous 0.9 0.5 - 1.9 mmol/L  Lactic acid, plasma  Result Value Ref Range   Lactic Acid, Venous 0.9 0.5 - 1.9 mmol/L  Glucose, capillary  Result Value Ref Range   Glucose-Capillary 91 70 - 99 mg/dL  Glucose, capillary  Result Value Ref Range   Glucose-Capillary 90 70 - 99 mg/dL  Glucose, capillary  Result Value Ref Range   Glucose-Capillary 77 70 - 99 mg/dL  Glucose, capillary  Result Value Ref Range   Glucose-Capillary 81 70 - 99 mg/dL  Glucose, capillary  Result Value Ref Range   Glucose-Capillary 76 70 - 99 mg/dL  CBC  Result Value Ref Range   WBC 10.4 4.0 - 10.5 K/uL   RBC 5.01 4.22 - 5.81 MIL/uL   Hemoglobin 13.3 13.0 - 17.0 g/dL   HCT 39.8 39.0 - 52.0 %   MCV 79.4 (L) 80.0 - 100.0 fL   MCH 26.5 26.0 - 34.0 pg   MCHC 33.4 30.0 - 36.0 g/dL   RDW 13.5 11.5 - 15.5 %   Platelets 173 150 - 400 K/uL   nRBC  0.0 0.0 - 0.2 %  Magnesium  Result Value Ref Range   Magnesium 1.7 1.7 - 2.4 mg/dL  Basic metabolic panel  Result Value Ref Range   Sodium 138 135 - 145 mmol/L   Potassium 3.5 3.5 - 5.1 mmol/L   Chloride 105 98 - 111 mmol/L   CO2 24 22 - 32 mmol/L   Glucose, Bld 137 (H) 70 - 99 mg/dL   BUN 15 6 - 20 mg/dL   Creatinine, Ser 1.83 (H) 0.61 - 1.24 mg/dL   Calcium 8.2 (L) 8.9 - 10.3 mg/dL   GFR, Estimated 47 (L) >60 mL/min   Anion gap 9 5 - 15  Phosphorus  Result Value Ref Range   Phosphorus 1.9 (L) 2.5 - 4.6 mg/dL  Glucose, capillary  Result Value Ref Range   Glucose-Capillary 125 (H) 70 - 99 mg/dL  Glucose, capillary  Result Value Ref Range   Glucose-Capillary 105 (H) 70 - 99 mg/dL  Glucose, capillary  Result Value Ref Range   Glucose-Capillary 140 (H) 70 - 99 mg/dL  Glucose, capillary  Result Value Ref Range   Glucose-Capillary 177 (H) 70 - 99 mg/dL  Glucose, capillary  Result Value Ref Range   Glucose-Capillary 136 (H) 70 - 99 mg/dL  Glucose, capillary  Result Value Ref Range   Glucose-Capillary 162 (H) 70 - 99 mg/dL  CBG monitoring, ED  Result Value Ref Range   Glucose-Capillary 124 (H) 70 - 99 mg/dL  Surgical pathology  Result Value Ref Range   SURGICAL PATHOLOGY      SURGICAL PATHOLOGY CASE: [id number] PATIENT: Nettie Metoyer Surgical Pathology Report     Specimen Submitted: A. Appendix  Clinical History: Acute appendicitis      DIAGNOSIS: A.  APPENDIX; APPENDECTOMY: - SUBACUTE APPENDICITIS. - NEGATIVE FOR MALIGNANCY.  GROSS DESCRIPTION: A. Labeled: Appendix Received: Fresh Collection time: 6:04 PM on 11/08/2021 Placed into formalin time: 6:52 PM on 11/08/2021 Size: 14 cm long by 0.7 cm in diameter with an attached 5 x 2 x 1.9 cm portion of mesoappendix External surface: The  serosa is tan-pink with distal areas of overlying hemorrhage.  The specimen is received in 2 fragments with the distal portion of 1 fragment containing mucosa that has  been stripped from the appendiceal tip. Perforation: None grossly appreciated. Fecalith: None grossly appreciated. Description: The proximal margin is inked blue.  The proximal lumen contains tan-green softened intestinal debris.  The mucosa is tan and focally flattened.  The wa ll thickness is up to 0.2 cm.  Block summary: 1 - one half bisected appendiceal tip and representative cross-sections 2 - one half bisected resection margin and representative cross-sections  RB 11/09/2021  Final Diagnosis performed by Quay Burow, MD.   Electronically signed 11/12/2021 10:36:07AM The electronic signature indicates that the named Attending Pathologist has evaluated the specimen Technical component performed at [facility], [address] [place], [place] [address] Lab: [phone] Dir: Rush Farmer, MD, MMM  Professional component performed at [facility], [facility], [place], [place], [place] [address] Lab: [phone] Dir: Kathi Simpers, MD       Assessment & Plan:   Problem List Items Addressed This Visit     Acute renal failure superimposed on stage 3a chronic kidney disease ([place]) - Primary   Relevant Orders   COMPLETE METABOLIC PANEL WITH GFR   CBC with Differential/Platelet   Other Visit Diagnoses     S/P laparoscopic appendectomy       Neuropathic pain       Relevant Medications   gabapentin (NEURONTIN) 100 MG capsule       HFU  S/p lap appendectomy- appendicitis  Today clinically much improved Post op pain resolved Bowel routine improved now  He has new issue with neuropathic localized pain lower abdomen, persistent provoked with movement. Recommend trial on Gabapentin, dose titration, reviewed benefits risk side effect dosing.  Has opiate but seems not indicated at this time.  No post op with gen surgery yet, scheduling issue. We called today to schedule, he will be seen post op tomorrow 10/17 at 4pm  Acute renal failure Check chemistry  today pending result, follow-up   Meds ordered this encounter  Medications   gabapentin (NEURONTIN) 100 MG capsule    Sig: Start 1 capsule daily, increase by 1 cap every 2-3 days as tolerated up to 3 times a day, or may take 3 at once in evening.    Dispense:  90 capsule    Refill:  1    Follow up plan: Return if symptoms worsen or fail to improve.  Nobie Putnam, [facility] 11/26/2021, 3:03 PM

## 2021-11-27 ENCOUNTER — Ambulatory Visit (INDEPENDENT_AMBULATORY_CARE_PROVIDER_SITE_OTHER): Payer: Medicare Other | Admitting: Physician Assistant

## 2021-11-27 ENCOUNTER — Encounter: Payer: Self-pay | Admitting: Family Medicine

## 2021-11-27 ENCOUNTER — Encounter: Payer: Self-pay | Admitting: Physician Assistant

## 2021-11-27 VITALS — BP 132/87 | HR 96 | Temp 98.8°F | Ht 67.0 in | Wt 257.6 lb

## 2021-11-27 DIAGNOSIS — N179 Acute kidney failure, unspecified: Secondary | ICD-10-CM

## 2021-11-27 DIAGNOSIS — Z09 Encounter for follow-up examination after completed treatment for conditions other than malignant neoplasm: Secondary | ICD-10-CM

## 2021-11-27 DIAGNOSIS — K3580 Unspecified acute appendicitis: Secondary | ICD-10-CM

## 2021-11-27 LAB — COMPLETE METABOLIC PANEL WITH GFR
AG Ratio: 1.3 (calc) (ref 1.0–2.5)
ALT: 17 U/L (ref 9–46)
AST: 15 U/L (ref 10–40)
Albumin: 3.8 g/dL (ref 3.6–5.1)
Alkaline phosphatase (APISO): 63 U/L (ref 36–130)
BUN/Creatinine Ratio: 12 (calc) (ref 6–22)
BUN: 26 mg/dL — ABNORMAL HIGH (ref 7–25)
CO2: 24 mmol/L (ref 20–32)
Calcium: 9 mg/dL (ref 8.6–10.3)
Chloride: 108 mmol/L (ref 98–110)
Creat: 2.1 mg/dL — ABNORMAL HIGH (ref 0.60–1.29)
Globulin: 3 g/dL (calc) (ref 1.9–3.7)
Glucose, Bld: 88 mg/dL (ref 65–99)
Potassium: 3.6 mmol/L (ref 3.5–5.3)
Sodium: 141 mmol/L (ref 135–146)
Total Bilirubin: 0.4 mg/dL (ref 0.2–1.2)
Total Protein: 6.8 g/dL (ref 6.1–8.1)
eGFR: 40 mL/min/{1.73_m2} — ABNORMAL LOW (ref >=60)

## 2021-11-27 LAB — CBC WITH DIFFERENTIAL/PLATELET
Absolute Monocytes: 390 cells/uL (ref 200–950)
Basophils Absolute: 52 cells/uL (ref 0–200)
Basophils Relative: 1.1 %
Eosinophils Absolute: 212 cells/uL (ref 15–500)
Eosinophils Relative: 4.5 %
HCT: 41.7 % (ref 38.5–50.0)
Hemoglobin: 13.5 g/dL (ref 13.2–17.1)
Lymphs Abs: 1828 cells/uL (ref 850–3900)
MCH: 27.2 pg (ref 27.0–33.0)
MCHC: 32.4 g/dL (ref 32.0–36.0)
MCV: 84.1 fL (ref 80.0–100.0)
MPV: 9.7 fL (ref 7.5–12.5)
Monocytes Relative: 8.3 %
Neutro Abs: 2218 cells/uL (ref 1500–7800)
Neutrophils Relative %: 47.2 %
Platelets: 245 10*3/uL (ref 140–400)
RBC: 4.96 10*6/uL (ref 4.20–5.80)
RDW: 14.7 % (ref 11.0–15.0)
Total Lymphocyte: 38.9 %
WBC: 4.7 10*3/uL (ref 3.8–10.8)

## 2021-11-27 NOTE — Patient Instructions (Addendum)
Please call the office with any questions or concerns.   No lifting  for 2 more weeks. Take Tylenol for the discomfort.

## 2021-11-27 NOTE — Progress Notes (Signed)
[facility] POST-OP OFFICE VISIT  11/27/2021  HPI: Joel [name] is a 41 y.o. male 19 days s/p robotic assisted laparoscopic appendectomy for acute appendicitis with Dr Christian Mate   Overall doing well Biggest complaint is a burning and stabbing pain in the LLQ, this is away from his incisions, no other pains. Saw PCP and started on Gabapentin and this has helped.  No fever, chills, nausea, emesis Bowel movements have returned to normal No issues with incisions themselves No other complaints   Vital signs: BP 132/87   Pulse 96   Temp 98.8 F (37.1 C) (Oral)   Ht 5\' 7"  (1.702 m)   Wt 257 lb 9.6 oz (116.8 kg)   SpO2 98%   BMI 40.35 kg/m    Physical Exam: Constitutional: Well appearing male, NAD Abdomen: Soft, non-tender, non-distended, no rebound/guarding. Endorses burning sensation is in LLQ, not near any laparoscopic sites Skin: Laparoscopic incisions are healing well, no erythema or drainage   Assessment/Plan: This is a 41 y.o. male 19 days s/p robotic assisted laparoscopic appendectomy for acute appendicitis    - Pain control prn; I agree with gabapentin and this seems to be helping. PCP is titrating this. Will defer to them for management of this medication; I will be happy to help as needed. Encouraged supplemental pain management with OTC medications although he is limited by CKD  - Reviewed wound care recommendation  - Reviewed lifting restrictions; 4 weeks total  - Reviewed surgical pathology; Appendicitis   - He can follow up on as needed basis; He, and his wife, understand to call with questions/concerns  -- Edison Simon, PA-C [facility] 11/27/2021, 4:01 PM M-F: 7am - 4pm

## 2021-11-28 ENCOUNTER — Ambulatory Visit (INDEPENDENT_AMBULATORY_CARE_PROVIDER_SITE_OTHER): Payer: Medicare Other

## 2021-11-28 DIAGNOSIS — G4733 Obstructive sleep apnea (adult) (pediatric): Secondary | ICD-10-CM | POA: Diagnosis not present

## 2021-11-28 NOTE — Progress Notes (Unsigned)
95 percentile pressure 11.6   95th percentile leak 18.2   apnea index 0.4 /hr  apnea-hypopnea index  0.6 /hr   total days used  >4 hr 87 days  total days used <4 hr 1 days  Total compliance 97 percent  He is doing great no problems or questions at this time   Pt was seen by Claiborne Billings  RRT/RCP  from [facility]

## 2021-12-10 ENCOUNTER — Other Ambulatory Visit: Payer: Self-pay | Admitting: Family Medicine

## 2021-12-10 DIAGNOSIS — K219 Gastro-esophageal reflux disease without esophagitis: Secondary | ICD-10-CM

## 2021-12-11 ENCOUNTER — Ambulatory Visit: Payer: Medicare Other | Admitting: Nurse Practitioner

## 2021-12-11 NOTE — Telephone Encounter (Signed)
Requested Prescriptions  Pending Prescriptions Disp Refills  . pantoprazole (PROTONIX) 40 MG tablet [Pharmacy Med Name: Pantoprazole Sodium 40 MG Oral Tablet Delayed Release] 90 tablet 0    Sig: TAKE 1 TABLET BY MOUTH ONCE DAILY BEFORE BREAKFAST. STOP OMEPRAZOLE     Gastroenterology: Proton Pump Inhibitors Passed - 12/10/2021 10:29 AM      Passed - Valid encounter within last 12 months    Recent Outpatient Visits          2 weeks ago Acute renal failure superimposed on stage 3a chronic kidney disease, unspecified acute renal failure type [facility])   Fernando Salinas, DO   4 months ago Bursitis of right shoulder   [place], DO   1 year ago Seasonal allergic rhinitis due to other allergic trigger   [place], DO   1 year ago Hip swelling, right   Bre[place], DO   1 year ago Hordeolum internum left upper eyelid   [place]offe[place], DO      Future Appointments            In 1 month [place], MD [place]

## 2021-12-12 ENCOUNTER — Telehealth: Payer: Self-pay

## 2021-12-12 NOTE — Telephone Encounter (Signed)
Clearance from [facility] received via fax, placed on NP desk for review and signature.

## 2021-12-13 ENCOUNTER — Encounter: Payer: Self-pay | Admitting: Family Medicine

## 2021-12-13 NOTE — Telephone Encounter (Signed)
Patient has been stable from a stroke standpoint over the past year without any new stroke/TIA symptoms.  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  Can hold Plavix if needed for 3 to 5 days prior with small but acceptable risk of periprocedural stroke while off therapy.  Restart immediately after or once hemodynamically stable.

## 2021-12-13 NOTE — Telephone Encounter (Signed)
Medical clearance with NP recommendations attached, faxed back to [facility] @ [phone]. Confirmation received.

## 2021-12-17 ENCOUNTER — Encounter: Payer: Self-pay | Admitting: Cardiovascular Disease

## 2021-12-17 ENCOUNTER — Encounter: Payer: Self-pay | Admitting: Adult Health

## 2021-12-17 ENCOUNTER — Other Ambulatory Visit: Payer: Self-pay | Admitting: Cardiovascular Disease

## 2021-12-17 MED ORDER — CARVEDILOL 25 MG PO TABS: 25.0000 mg | ORAL_TABLET | Freq: Two times a day (BID) | ORAL | 3 refills | Status: DC

## 2021-12-18 ENCOUNTER — Other Ambulatory Visit: Payer: Self-pay

## 2021-12-18 MED ORDER — CLOPIDOGREL BISULFATE 75 MG PO TABS: 75.0000 mg | ORAL_TABLET | Freq: Every day | ORAL | 3 refills | Status: DC

## 2021-12-19 ENCOUNTER — Encounter: Payer: Self-pay | Admitting: Nurse Practitioner

## 2021-12-19 ENCOUNTER — Ambulatory Visit (INDEPENDENT_AMBULATORY_CARE_PROVIDER_SITE_OTHER): Payer: Medicare Other | Admitting: Nurse Practitioner

## 2021-12-19 ENCOUNTER — Other Ambulatory Visit: Payer: Self-pay

## 2021-12-19 VITALS — BP 140/86 | HR 100 | Temp 98.3°F | Resp 16 | Ht 67.0 in | Wt 266.2 lb

## 2021-12-19 DIAGNOSIS — G4733 Obstructive sleep apnea (adult) (pediatric): Secondary | ICD-10-CM | POA: Diagnosis not present

## 2021-12-19 DIAGNOSIS — Z7189 Other specified counseling: Secondary | ICD-10-CM

## 2021-12-19 MED ORDER — ROSUVASTATIN CALCIUM 40 MG PO TABS: 40.0000 mg | ORAL_TABLET | Freq: Every day | ORAL | 3 refills | Status: DC

## 2021-12-19 NOTE — Progress Notes (Signed)
[facility] [address] [place], [place] [address]  Internal MEDICINE  Office Visit Note  Patient Name: Joel [name]  [id number]  [id number]  Date of Service: 12/19/2021  Chief Complaint  Patient presents with   Follow-up    cpap    HPI Joel [name] presents for a follow up visit for OSA and CPAP use.  -- 97% compliance does well feels rested in the morning. No change in energy level during the day, epworth score is unchanged at 2 out of 24. If he does take a nap during the day, he uses his CPAP during his nap as well, this still does not happen often.  CPAP download:  95 percentile pressure 11.6 95th percentile leak 18.2 apnea index 0.4 /hr apnea-hypopnea index  0.6 /hr total days used  >4 hr 87 days total days used <4 hr 1 days Total compliance 97 percent   He is doing great no problems or questions at this time  Pt was seen by Tresa Endo  RRT/RCP  from [facility]  EPWORTH SLEEPINESS SCALE: Scale: (0)= no chance of dozing; (1)= slight chance of dozing; (2)= moderate chance of dozing; (3)= high chance of dozing Chance  Situtation Sitting and reading: 0 Watching TV: 0 Sitting Inactive in public: 0 As a passenger in car: 0   Lying down to rest: 2 Sitting and talking: 0 Sitting quielty after lunch: 0 In a car, stopped in traffic: 0 TOTAL SCORE:   2 out of 24   Current Medication: Outpatient Encounter Medications as of 12/19/2021  Medication Sig   Alirocumab (PRALUENT) 75 MG/ML SOAJ Inject 1 Pen into the skin every 14 (fourteen) days.   amLODipine (NORVASC) 10 MG tablet Take 1 tablet by mouth once daily   carvedilol (COREG) 25 MG tablet Take 1 tablet (25 mg total) by mouth 2 (two) times daily with a meal.   clopidogrel (PLAVIX) 75 MG tablet Take 1 tablet (75 mg total) by mouth daily.   cyclobenzaprine (FLEXERIL) 10 MG tablet TAKE 1 TABLET BY MOUTH TWICE DAILY AS NEEDED FOR MUSCLE SPASM   escitalopram (LEXAPRO) 10 MG tablet Take 15 mg by mouth daily.   ezetimibe (ZETIA)  10 MG tablet Take 1 tablet (10 mg total) by mouth daily.   furosemide (LASIX) 20 MG tablet Take 1 tablet (20 mg total) by mouth daily as needed. For shortness of breath   gabapentin (NEURONTIN) 100 MG capsule Start 1 capsule daily, increase by 1 cap every 2-3 days as tolerated up to 3 times a day, or may take 3 at once in evening.   JARDIANCE 25 MG TABS tablet Take 25 mg by mouth daily.   montelukast (SINGULAIR) 10 MG tablet TAKE 1 TABLET BY MOUTH AT BEDTIME   ondansetron (ZOFRAN-ODT) 4 MG disintegrating tablet Take 1 tablet (4 mg total) by mouth every 8 (eight) hours as needed for nausea or vomiting.   OZEMPIC, 2 MG/DOSE, 8 MG/3ML SOPN Inject 2 mg into the skin once a week.   pantoprazole (PROTONIX) 40 MG tablet TAKE 1 TABLET BY MOUTH ONCE DAILY BEFORE BREAKFAST. STOP OMEPRAZOLE   promethazine (PHENERGAN) 25 MG tablet Take 25 mg by mouth every 4 (four) hours as needed.   rosuvastatin (CRESTOR) 40 MG tablet Take 1 tablet (40 mg total) by mouth daily.   topiramate (TOPAMAX) 100 MG tablet Take 100 mg by mouth at bedtime.   TRESIBA FLEXTOUCH 100 UNIT/ML FlexTouch Pen Inject 22 Units into the skin daily.   No facility-administered encounter medications on  file as of 12/19/2021.    Surgical History: Past Surgical History:  Procedure Laterality Date   NO PAST SURGERIES     XI ROBOTIC LAPAROSCOPIC ASSISTED APPENDECTOMY N/A 11/08/2021   Procedure: XI ROBOTIC LAPAROSCOPIC ASSISTED APPENDECTOMY;  Surgeon: Campbell Lerner, MD;  Location: [facility] ORS;  Service: General;  Laterality: N/A;    Medical History: Past Medical History:  Diagnosis Date   Anxiety    Chest pain    Chronic diastolic CHF (congestive heart failure) (HCC)    Constipation    Depression    GERD (gastroesophageal reflux disease)    Hyperlipidemia    Hypertension    Hypertensive heart disease    Lactose intolerance    Noncompliance with medications    Obesity    Palpitations    Renal disorder    Shortness of breath on  exertion    Sleep apnea    Stroke (HCC)    Systolic dysfunction    a. TTE 06/2014: EF 45-50%, normal wall motion, mild MR, mild AI    Family History: Family History  Problem Relation Age of Onset   Cancer Mother    Hypertension Mother    Alcoholism Father    Hyperlipidemia Father    Diabetes Father    CAD Father        a. stenting in his early 76s   Heart disease Father    Stroke Father    Heart attack Father    Diabetes Maternal Grandmother    Diabetes Maternal Grandfather    CAD Paternal Grandmother    CAD Paternal Grandfather    Colon cancer Paternal Grandfather    Diabetes Paternal Uncle     Social History   Socioeconomic History   Marital status: Married    Spouse name: Joel [name]   Number of children: 1   Years of education: 12   Highest education level: 12th grade  Occupational History   Occupation: Disabled  Tobacco Use   Smoking status: Never   Smokeless tobacco: Never  Vaping Use   Vaping Use: Never used  Substance and Sexual Activity   Alcohol use: Not Currently    Comment: Very infrequently; ~2-3 drinks/year    Drug use: No   Sexual activity: Not Currently    Birth control/protection: None  Other Topics Concern   Not on file  Social History Narrative   Not on file   Social Determinants of Health   Financial Resource Strain: Medium Risk (10/09/2021)   Overall Financial Resource Strain (CARDIA)    Difficulty of Paying Living Expenses: Somewhat hard  Food Insecurity: No Food Insecurity (11/08/2021)   Hunger Vital Sign    Worried About Running Out of Food in the Last Year: Never true    Ran Out of Food in the Last Year: Never true  Transportation Needs: No Transportation Needs (11/08/2021)   PRAPARE - Administrator, Civil Service (Medical): No    Lack of Transportation (Non-Medical): No  Physical Activity: Inactive (10/03/2020)   Exercise Vital Sign    Days of Exercise per Week: 0 days    Minutes of Exercise per Session: 0 min   Stress: No Stress Concern Present (10/03/2020)   [employer] of Occupational Health - Occupational Stress Questionnaire    Feeling of Stress : Not at all  Social Connections: Moderately Isolated (10/09/2021)   Social Connection and Isolation Panel [NHANES]    Frequency of Communication with Friends and Family: Twice a week    Frequency of Social  Gatherings with Friends and Family: Once a week    Attends Religious Services: Never    Database administrator or Organizations: No    Attends Banker Meetings: Never    Marital Status: Married  Catering manager Violence: Not At Risk (11/08/2021)   Humiliation, Afraid, Rape, and Kick questionnaire    Fear of Current or Ex-Partner: No    Emotionally Abused: No    Physically Abused: No    Sexually Abused: No      Review of Systems  Constitutional:  Negative for chills, fatigue and unexpected weight change.  HENT:  Negative for congestion, rhinorrhea, sneezing and sore throat.   Eyes:  Negative for redness.  Respiratory:  Negative for cough, chest tightness and shortness of breath.   Cardiovascular:  Negative for chest pain and palpitations.  Gastrointestinal:  Negative for abdominal pain, constipation, diarrhea, nausea and vomiting.  Genitourinary:  Negative for dysuria and frequency.  Musculoskeletal:  Negative for arthralgias, back pain, joint swelling and neck pain.  Skin:  Negative for rash.  Neurological: Negative.  Negative for tremors and numbness.  Hematological:  Negative for adenopathy. Does not bruise/bleed easily.  Psychiatric/Behavioral:  Negative for behavioral problems (Depression), sleep disturbance and suicidal ideas. The patient is not nervous/anxious.     Vital Signs: BP (!) 140/86   Pulse 100   Temp 98.3 F (36.8 C)   Resp 16   Ht 5\' 7"  (1.702 m)   Wt 266 lb 3.2 oz (120.7 kg)   SpO2 98%   BMI 41.69 kg/m    Physical Exam Vitals reviewed.  Constitutional:      General: He is not in acute  distress.    Appearance: Normal appearance. He is obese. He is not ill-appearing.  HENT:     Head: Normocephalic and atraumatic.  Eyes:     Pupils: Pupils are equal, round, and reactive to light.  Cardiovascular:     Rate and Rhythm: Normal rate and regular rhythm.  Pulmonary:     Effort: Pulmonary effort is normal. No respiratory distress.  Neurological:     Mental Status: He is alert and oriented to person, place, and time.  Psychiatric:        Mood and Affect: Mood normal.        Behavior: Behavior normal.        Assessment/Plan: 1. OSA on CPAP 97% compliance. Can't sleep without it, wakes up well rested. no concerns or issues. Epworth sleepiness scale is unchanged at 2 out of 24. No decrease in daytime energy level. Follow up in 6 months.    2. CPAP use counseling Does not need any supplies. Encouraged to keep up with routine maintenance and cleaning of the CPAP machine, patient does not have any questions regarding maintenance or cleaning of the machine.  General Counseling: Joel [name] understanding of the findings of todays visit and agrees with plan of treatment. I have discussed any further diagnostic evaluation that may be needed or ordered today. We also reviewed his medications today. he has been encouraged to call the office with any questions or concerns that should arise related to todays visit.    No orders of the defined types were placed in this encounter.   No orders of the defined types were placed in this encounter.   Return in about 6 months (around 06/19/2022) for F/U CPAP OSA, Joel [name] PCP.   Total time spent:20 Minutes Time spent includes review of chart, medications, test results, and follow up  plan with the patient.    Controlled Substance Database was reviewed by me.  This patient was seen by Sallyanne Kuster, FNP-C in collaboration with Dr. Beverely Risen as a part of collaborative care agreement.   Roran Wegner R. Tedd Sias, MSN, FNP-C Internal  medicine

## 2021-12-21 ENCOUNTER — Encounter: Payer: Self-pay | Admitting: Nurse Practitioner

## 2021-12-25 ENCOUNTER — Other Ambulatory Visit: Payer: Medicare Other

## 2021-12-25 ENCOUNTER — Other Ambulatory Visit: Payer: Self-pay

## 2021-12-25 DIAGNOSIS — N179 Acute kidney failure, unspecified: Secondary | ICD-10-CM

## 2021-12-26 LAB — BASIC METABOLIC PANEL WITH GFR
BUN/Creatinine Ratio: 13 (calc) (ref 6–22)
BUN: 21 mg/dL (ref 7–25)
CO2: 27 mmol/L (ref 20–32)
Calcium: 9.2 mg/dL (ref 8.6–10.3)
Chloride: 105 mmol/L (ref 98–110)
Creat: 1.59 mg/dL — ABNORMAL HIGH (ref 0.60–1.29)
Glucose, Bld: 96 mg/dL (ref 65–99)
Potassium: 3.7 mmol/L (ref 3.5–5.3)
Sodium: 142 mmol/L (ref 135–146)
eGFR: 56 mL/min/{1.73_m2} — ABNORMAL LOW (ref >=60)

## 2022-01-16 ENCOUNTER — Ambulatory Visit: Admission: RE | Admit: 2022-01-16 | Payer: Medicare Other | Source: Ambulatory Visit | Admitting: Otolaryngology

## 2022-01-16 ENCOUNTER — Ambulatory Visit: Payer: Medicare Other | Admitting: Cardiovascular Disease

## 2022-01-16 ENCOUNTER — Encounter: Admission: RE | Payer: Self-pay | Source: Ambulatory Visit

## 2022-01-16 SURGERY — EXCISION, CYST, EAR
Anesthesia: General | Laterality: Right

## 2022-01-24 ENCOUNTER — Other Ambulatory Visit: Payer: Self-pay | Admitting: Cardiovascular Disease

## 2022-01-24 DIAGNOSIS — I110 Hypertensive heart disease with heart failure: Secondary | ICD-10-CM

## 2022-02-13 ENCOUNTER — Ambulatory Visit: Payer: Managed Care, Other (non HMO) | Attending: Cardiovascular Disease | Admitting: Cardiovascular Disease

## 2022-02-13 ENCOUNTER — Encounter: Payer: Self-pay | Admitting: Cardiovascular Disease

## 2022-02-13 VITALS — BP 104/76 | HR 89 | Ht 68.0 in | Wt 281.0 lb

## 2022-02-13 DIAGNOSIS — I10 Essential (primary) hypertension: Secondary | ICD-10-CM | POA: Insufficient documentation

## 2022-02-13 NOTE — Progress Notes (Signed)
Date:  02/13/2022   ID:  Joel [name], DOB [birth year], MRN [id number]  Patient Location:  [address] [place] [address]   Provider location:   [facility], [place] office  PCP:  [name], DO  Cardiologist:  Patsy Baltimore  Chief Complaint  Patient presents with   6 month follow up     "Doing well." Medications reviewed by the patient verbally.     History of Present Illness:    Joel [name] is a 42 y.o. 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  DM, followed by endocrine HLD,  medication noncompliance,  obesity,  sleep apena on CPAP Hospital admission February 13, 2017 acute on chronic combined CHF hypertensive emergency. Last echocardiogram 2021 ejection fraction 55% He presents for follow-up of his systolic and diastolic CHF  Last seen in clinic by myself June 2023 In follow-up today reports he continues to struggle with his diet, carbohydrates  Recent lab work discussed showing dramatic improvement in lipid panel and A1c A1C 6.1, previously 13 up to 14 Total chol 141, previously 300  Active but not going back to the gym Denies significant shortness of breath or chest pain Reports compliance with medications  Not losing weight on Ozempic Cut out  the sodas but still drinking sweet tea  EKG personally reviewed by myself on todays visit Normal sinus rhythm left axis deviation rate 89 bpm poor R wave progression through the anterior precordial leads, left axis deviation  Other past medical history reviewed Acute CVA 02/2019 facial numbness and dizziness and right-sided weakness  2D echo showed normal ejection fraction without cardiac source of  embolism.  Seen by neurology Recommend dual antiplatelet therapy with aspirin and Plavix for 3 weeks followed by Plavix alone  MR ANGIO HEAD WO CONTRAST 03/20/2019 1. No large vessel occlusion. Possible occlusion of the right AICA, or it could be diminutive or absent.  2. Severe stenosis versus artifact in the left PCA distal P2/proximal P3 segment.  3. Suggestion of intracranial atherosclerosis involving the anterior and posterior circulation, including mild irregularity of the: distal right vertebral artery, basilar artery, supraclinoid right ICA, and bilateral MCA branches.  4. Non dominant left vertebral artery functionally terminates in PICA. Fetal type bilateral PCA origins.    MR ANGIO NECK   03/20/2019 Negative neck MRA. Dominant right vertebral artery.    MR BRAIN 03/19/2019 Small acute infarction involving the right middle cerebellar peduncle. Corresponding enhancement may reflect early blood brain barrier disruption. Given presence of vascular risk factors, ischemia is favored over demyelination. There is also a probable small chronic infarct near the right frontal horn.   Presented to the hospital 02/12/17/18 due to HF and HTN.  Hypertensive urgency on arrival Echo report from 02/12/17 EF of 25-30%  Aggressive diuresis, blood pressure control  Echo 02/2017 Left ventricle: The cavity size was normal. There was moderate   concentric hypertrophy. Systolic function was severely reduced.   The estimated ejection fraction was in the range of 25% to 30%.   Diffuse hypokinesis. Regional wall motion abnormalities cannot be   excluded. Features are consistent with a pseudonormal left   ventricular filling pattern, with concomitant abnormal relaxation   and increased filling pressure (grade 2 diastolic dysfunction).  Past Medical History:  Diagnosis Date   Anxiety    Chest pain    Chronic diastolic CHF (congestive heart failure) (HCC)    Constipation    Depression    GERD  (gastroesophageal reflux disease)    Hyperlipidemia    Hypertension    Hypertensive heart disease    Lactose intolerance    Noncompliance with medications    Obesity    Palpitations    Renal disorder    Shortness of breath on exertion    Sleep apnea    Stroke (HCC)    Systolic dysfunction    a. TTE 06/2014: EF 45-50%, normal wall motion, mild MR, mild AI   Past Surgical History:  Procedure Laterality Date   NO PAST SURGERIES     XI ROBOTIC LAPAROSCOPIC ASSISTED APPENDECTOMY N/A 11/08/2021   Procedure: XI ROBOTIC LAPAROSCOPIC ASSISTED APPENDECTOMY;  Surgeon: Campbell Lerner, MD;  Location: [facility] ORS;  Service: General;  Laterality: N/A;     Current Outpatient Medications on File Prior to Visit  Medication Sig Dispense Refill   Alirocumab (PRALUENT) 75 MG/ML SOAJ Inject 1 Pen into the skin every 14 (fourteen) days. 6 mL 3   amLODipine (NORVASC) 10 MG tablet Take 1 tablet by mouth once daily 90 tablet 0   carvedilol (COREG) 25 MG tablet Take 1 tablet (25 mg total) by mouth 2 (two) times daily with a meal. 180 tablet 3   clopidogrel (PLAVIX) 75 MG tablet Take 1 tablet (75 mg total) by mouth daily. 90 tablet 3   ezetimibe (ZETIA) 10 MG tablet Take 1 tablet (10 mg total) by mouth daily. 90 tablet 3   furosemide (LASIX) 20 MG tablet Take 1 tablet (20 mg total) by mouth daily as needed. For shortness of breath 90 tablet 3   JARDIANCE 25 MG TABS tablet Take 25 mg by mouth daily.     montelukast (SINGULAIR) 10 MG tablet TAKE 1 TABLET BY MOUTH AT BEDTIME 90 tablet 0   OZEMPIC, 2 MG/DOSE, 8 MG/3ML SOPN Inject 2 mg into the skin once a week.     pantoprazole (PROTONIX) 40 MG tablet TAKE 1 TABLET BY MOUTH ONCE DAILY BEFORE BREAKFAST. STOP OMEPRAZOLE 90 tablet 0   rosuvastatin (CRESTOR) 40 MG tablet Take 1 tablet (40 mg total) by mouth daily. 90 tablet 3   TRESIBA FLEXTOUCH 100 UNIT/ML FlexTouch Pen Inject 22 Units into the skin daily.     cyclobenzaprine (FLEXERIL) 10 MG tablet TAKE 1 TABLET BY  MOUTH TWICE DAILY AS NEEDED FOR MUSCLE SPASM (Patient not taking: Reported on 02/13/2022) 30 tablet 3   escitalopram (LEXAPRO) 10 MG tablet Take 15 mg by mouth daily. (Patient not taking: Reported on 02/13/2022)     gabapentin (NEURONTIN) 100 MG capsule Start 1 capsule daily, increase by 1 cap every 2-3 days as tolerated up to 3 times a day, or may take 3 at once in evening. (Patient not taking: Reported on 02/13/2022) 90 capsule 1   ondansetron (ZOFRAN-ODT) 4 MG disintegrating tablet Take 1 tablet (4 mg total) by mouth every 8 (eight) hours as needed for nausea or vomiting. (Patient not taking: Reported on 02/13/2022) 30 tablet 0   promethazine (PHENERGAN) 25 MG tablet Take 25 mg by mouth every 4 (four) hours as needed. (Patient not taking: Reported on 02/13/2022)     topiramate (TOPAMAX) 100 MG tablet Take 100 mg by mouth at bedtime. (Patient not taking: Reported on 02/13/2022)     No current facility-administered medications on file prior to  visit.     Allergies:   Bidil [isosorb dinitrate-hydralazine] and Nitroglycerin , H/A  Social History   Tobacco Use   Smoking status: Never   Smokeless tobacco: Never  Vaping Use   Vaping Use: Every day   Substances: Nicotine, Flavoring  Substance Use Topics   Alcohol use: Not Currently    Comment: Very infrequently; ~2-3 drinks/year    Drug use: No    Family Hx: The patient's family history includes Alcoholism in his father; CAD in his father, paternal grandfather, and paternal grandmother; Cancer in his mother; Colon cancer in his paternal grandfather; Diabetes in his father, maternal grandfather, maternal grandmother, and paternal uncle; Heart attack in his father; Heart disease in his father; Hyperlipidemia in his father; Hypertension in his mother; Stroke in his father.  ROS:   Please see the history of present illness.    Review of Systems  Constitutional: Negative.   HENT: Negative.    Respiratory: Negative.    Cardiovascular: Negative.    Gastrointestinal: Negative.   Musculoskeletal: Negative.   Neurological: Negative.   Psychiatric/Behavioral: Negative.    All other systems reviewed and are negative.    Labs/Other Tests and Data Reviewed:    Recent Labs: 11/09/2021: Magnesium 1.7 11/26/2021: ALT 17; Hemoglobin 13.5; Platelets 245 12/25/2021: BUN 21; Creat 1.59; Potassium 3.7; Sodium 142   Recent Lipid Panel Lab Results  Component Value Date/Time   CHOL 141 09/19/2021 04:30 PM   CHOL 361 (H) 06/11/2014 12:32 PM   TRIG 145 09/19/2021 04:30 PM   TRIG 246 (H) 06/11/2014 12:32 PM   HDL 28 (L) 09/19/2021 04:30 PM   HDL 41 06/11/2014 12:32 PM   CHOLHDL 5.0 09/19/2021 04:30 PM   CHOLHDL 12.6 03/20/2019 01:13 AM   LDLCALC 87 09/19/2021 04:30 PM   LDLCALC 271 (H) 06/11/2014 12:32 PM    Wt Readings from Last 3 Encounters:  02/13/22 281 lb (127.5 kg)  12/19/21 266 lb 3.2 oz (120.7 kg)  11/27/21 257 lb 9.6 oz (116.8 kg)     Exam:    Vital Signs: Vital signs may also be detailed in the HPI BP 104/76 (BP Location: Left Arm, Patient Position: Sitting, Cuff Size: Large)   Pulse 89   Ht 5\' 8"  (1.727 m)   Wt 281 lb (127.5 kg)   SpO2 98%   BMI 42.73 kg/m   Constitutional:  oriented to person, place, and time. No distress.  HENT:  Head: Grossly normal Eyes:  no discharge. No scleral icterus.  Neck: No JVD, no carotid bruits  Cardiovascular: Regular rate and rhythm, no murmurs appreciated Pulmonary/Chest: Clear to auscultation bilaterally, no wheezes or rails Abdominal: Soft.  no distension.  no tenderness.  Musculoskeletal: Normal range of motion Neurological:  normal muscle tone. Coordination normal. No atrophy Skin: Skin warm and dry Psychiatric: normal affect, pleasant   ASSESSMENT & PLAN:    Dilated cardiomyopathy (HCC) EF has fluctuated 45 to 50% then dropped 25 to 30% in 2019 Ejection fraction back up to normal January 2021 Continue current medications, appears euvolemic Blood pressure  well-controlled  systolic and diastolic CHF, chronic (HCC) -  Not on Lasix Blood pressure well controlled, most recent echocardiogram with ejection fraction 55% No changes made to medications  Essential hypertension intolerance to BiDil, isosorbide Avoiding ACE, ARB, Entresto in the setting of renal failure Recommend he continue amlodipine, carvedilol  Blood pressure stable  History of stroke Residual right-sided deficits  cerebrovascular disease  on MRI/MRA  Plavix continued  on Repatha, Crestor  Zetia Cholesterol dramatically improved, diabetes numbers improved  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, [place], [place] [address]

## 2022-02-13 NOTE — Patient Instructions (Signed)
Medication Instructions:  No changes  If you need a refill on your cardiac medications before your next appointment, please call your pharmacy.   Lab work: No new labs needed  Testing/Procedures: No new testing needed  Follow-Up: At [facility], you and your health needs are our priority.  As part of our continuing mission to provide you with exceptional heart care, we have created designated Provider Care Teams.  These Care Teams include your primary Cardiologist (physician) and Advanced Practice Providers (APPs -  Physician Assistants and Nurse Practitioners) who all work together to provide you with the care you need, when you need it.  You will need a follow up appointment in 12 months  Providers on your designated Care Team:   Christopher Berge, NP Ryan Dunn, PA-C Cadence Furth, PA-C  COVID-19 Vaccine Information can be found at: [email or web address] For questions related to vaccine distribution or appointments, please email [email or web address] or call [phone].   

## 2022-02-18 ENCOUNTER — Ambulatory Visit: Payer: Medicare Other | Admitting: Family Medicine

## 2022-02-19 ENCOUNTER — Ambulatory Visit (INDEPENDENT_AMBULATORY_CARE_PROVIDER_SITE_OTHER): Payer: Managed Care, Other (non HMO) | Admitting: Family Medicine

## 2022-02-19 ENCOUNTER — Encounter: Payer: Self-pay | Admitting: Family Medicine

## 2022-02-19 VITALS — BP 134/76 | HR 92 | Temp 98.1°F | Wt 276.0 lb

## 2022-02-19 DIAGNOSIS — M25512 Pain in left shoulder: Secondary | ICD-10-CM | POA: Diagnosis not present

## 2022-02-19 DIAGNOSIS — M25511 Pain in right shoulder: Secondary | ICD-10-CM

## 2022-02-19 DIAGNOSIS — G8929 Other chronic pain: Secondary | ICD-10-CM

## 2022-02-19 DIAGNOSIS — M778 Other enthesopathies, not elsewhere classified: Secondary | ICD-10-CM

## 2022-02-19 DIAGNOSIS — M7551 Bursitis of right shoulder: Secondary | ICD-10-CM | POA: Diagnosis not present

## 2022-02-19 DIAGNOSIS — M7582 Other shoulder lesions, left shoulder: Secondary | ICD-10-CM

## 2022-02-19 MED ORDER — CYCLOBENZAPRINE HCL 10 MG PO TABS: ORAL_TABLET | ORAL | 3 refills | Status: DC

## 2022-02-19 NOTE — Patient Instructions (Addendum)
Thank you for coming to the office today.  Likely rotator cuff tendonitis in a particular spot.  Re order Cyclobenzaprine Flexeril as needed at night for sleep and shoulder pain, caution sedation.  Try the pillow support at night as well underneath shoulder.  Caution with daily shoulder position / at desk / computer etc. Goal for more shoulder support and less repetitive strain on the shoulder.  If you want to chat with Dr Zigmund Daniel about the shoulders / ultrasound / maybe a future rehab regimen and possible injection I can refer you.  Please schedule a Follow-up Appointment to: Return if symptoms worsen or fail to improve.  If you have any other questions or concerns, please feel free to call the office or send a message through [facility]. You may also schedule an earlier appointment if necessary.  Additionally, you may be receiving a survey about your experience at our office within a few days to 1 week by e-mail or mail. We value your feedback.  Nobie Putnam, DO [place]

## 2022-02-19 NOTE — Progress Notes (Signed)
Subjective:    Joel [name] ID: Joel [name], male    DOB: 11/12/1980, 42 y.o.   MRN: [id number]  Joel [name] is a 42 y.o. male presenting on 02/19/2022 for Shoulder Pain   HPI  Left Shoulder Pain - Tendonitis / Bursitis, Acute Chronic Previous history Right Shoulder Pain  Right hand dominant  Past history 07/2021 similar issue R shoulder, and treated with Flexeril and conservative therapy, it eventually healed. Now he has similar issue on R shoulder  Past X-ray was negative.  Reports Sharp stabbing pain with arm in abduction away from body, worse with heavier lifting. He said often repetitive strain on shoulder sitting at desk and using computer  Denies radiating pain into rest of arm and no paresthesia        12/19/2021    3:58 PM 11/26/2021    2:53 PM 10/09/2021    3:08 PM  Depression screen PHQ 2/9  Decreased Interest 0 2 2  Down, Depressed, Hopeless 0 2 2  PHQ - 2 Score 0 4 4  Altered sleeping  2 2  Tired, decreased energy  2 1  Change in appetite  2 3  Feeling bad or failure about yourself   3 3  Trouble concentrating  2 3  Moving slowly or fidgety/restless  1 2  Suicidal thoughts  2 2  PHQ-9 Score  18 20  Difficult doing work/chores  Very difficult Very difficult    Social History   Tobacco Use   Smoking status: Never   Smokeless tobacco: Never  Vaping Use   Vaping Use: Every day   Substances: Nicotine, Flavoring  Substance Use Topics   Alcohol use: Not Currently    Comment: Very infrequently; ~2-3 drinks/year    Drug use: No    Review of Systems Per HPI unless specifically indicated above     Objective:    BP 134/76 (BP Location: Left Arm, Joel [name] Position: Sitting, Cuff Size: Large)   Pulse 92   Temp 98.1 F (36.7 C) (Oral)   Wt 276 lb (125.2 kg)   SpO2 98%   BMI 41.97 kg/m   Wt Readings from Last 3 Encounters:  02/19/22 276 lb (125.2 kg)  02/13/22 281 lb (127.5 kg)  12/19/21 266 lb 3.2 oz (120.7 kg)    Physical Exam Vitals and nursing  note reviewed.  Constitutional:      General: He is not in acute distress.    Appearance: Normal appearance. He is well-developed. He is not diaphoretic.     Comments: Well-appearing, comfortable, cooperative  HENT:     Head: Normocephalic and atraumatic.  Eyes:     General:        Right eye: No discharge.        Left eye: No discharge.     Conjunctiva/sclera: Conjunctivae normal.  Cardiovascular:     Rate and Rhythm: Normal rate.  Pulmonary:     Effort: Pulmonary effort is normal.  Musculoskeletal:     Comments: Reduced ROM with left shoulder abduction above shoulder level. Intact forward flexion and internal rotation. Some discomfort with impingement but no obvious rotator cuff weakness.  Skin:    General: Skin is warm and dry.     Findings: No erythema or rash.  Neurological:     Mental Status: He is alert and oriented to person, place, and time.  Psychiatric:        Mood and Affect: Mood normal.        Behavior: Behavior normal.  Thought Content: Thought content normal.     Comments: Well groomed, good eye contact, normal speech and thoughts    I have personally reviewed the radiology report from 08/02/21 on R Shoulder X-ray.   CLINICAL DATA:  Right shoulder pain for 2 months. Bursitis. Evaluate for arthritis or other injury.   EXAM: RIGHT SHOULDER - 2+ VIEW   COMPARISON:  None Available.   FINDINGS: Bone mineralization is subjectively normal. Normal alignment. Normal joint spaces. No significant osteophytes. No erosions. No evidence of a vascular necrosis. No focal bone lesion or bony destruction. No soft tissue calcifications.   IMPRESSION: Negative radiographs of the right shoulder.     Electronically Signed   By: Narda Rutherford M.D.   On: 08/02/2021 15:47  Results for orders placed or performed in visit on 12/25/21  BASIC METABOLIC PANEL WITH GFR  Result Value Ref Range   Glucose, Bld 96 65 - 99 mg/dL   BUN 21 7 - 25 mg/dL   Creat 3.41 (H)  9.62 - 1.29 mg/dL   eGFR 56 (L) > OR = 60 mL/min/1.69m2   BUN/Creatinine Ratio 13 6 - 22 (calc)   Sodium 142 135 - 146 mmol/L   Potassium 3.7 3.5 - 5.3 mmol/L   Chloride 105 98 - 110 mmol/L   CO2 27 20 - 32 mmol/L   Calcium 9.2 8.6 - 10.3 mg/dL      Assessment & Plan:   Problem List Items Addressed This Visit   None Visit Diagnoses     Chronic left shoulder pain    -  Primary   Relevant Medications   cyclobenzaprine (FLEXERIL) 10 MG tablet   Chronic right shoulder pain       Relevant Medications   cyclobenzaprine (FLEXERIL) 10 MG tablet   Bursitis of right shoulder       Relevant Medications   cyclobenzaprine (FLEXERIL) 10 MG tablet   Left shoulder tendonitis       Relevant Medications   cyclobenzaprine (FLEXERIL) 10 MG tablet       Consistent with subacute LEFT-shoulder bursitis vs rotator cuff tendinopathy with some reduced active ROM but without significant evidence of muscle tear (no weakness).   Similar to RIGHT shoulder previously 07/2021 treated. Now improved.  Concern with some repetitive actions with shoulders, not overhead. But he is at desk most of day and using computer. Some repetitive motions.  No clear etiology of injury  Last X-ray reviewed 07/2021 R shoulder, no arthritis    Plan: Discussion of etiology, similar to previous R shoulder. Now has L shoulder flare, likely tendonitis / bursitis given no structural arthritis. No evidence of acute injury tear or other structural problem.  Reviewed re-evaluating situations that trigger or provoke repetitive strain, with daily arm position, computer use, sleeping / wake up - can use arm rests, range of motion exercises, pillow support at night.  Re order Flexeril AS NEEDED, caution sedation Topical Voltaren, Tylenol AS NEEDED  Future recommend consider Sports Medicine for further guided therapy targeting particular rotator cuff tendons/muscles and rehab or future injection if indicated    Meds ordered this  encounter  Medications   cyclobenzaprine (FLEXERIL) 10 MG tablet    Sig: TAKE 1 TABLET BY MOUTH TWICE DAILY AS NEEDED FOR MUSCLE SPASM    Dispense:  60 tablet    Refill:  3      Follow up plan: Return if symptoms worsen or fail to improve.  Saralyn Pilar, DO [facility] 02/19/2022, 3:44 PM

## 2022-04-01 ENCOUNTER — Encounter: Payer: Self-pay | Admitting: Family Medicine

## 2022-04-01 DIAGNOSIS — G8929 Other chronic pain: Secondary | ICD-10-CM

## 2022-04-01 DIAGNOSIS — M7551 Bursitis of right shoulder: Secondary | ICD-10-CM

## 2022-04-01 DIAGNOSIS — M778 Other enthesopathies, not elsewhere classified: Secondary | ICD-10-CM

## 2022-04-09 ENCOUNTER — Encounter: Payer: Self-pay | Admitting: Family Medicine

## 2022-04-09 ENCOUNTER — Ambulatory Visit
Admission: RE | Admit: 2022-04-09 | Discharge: 2022-04-09 | Disposition: A | Discharge status: Home / Self Care | Payer: Managed Care, Other (non HMO) | Source: Ambulatory Visit | Attending: Family Medicine | Admitting: Family Medicine

## 2022-04-09 ENCOUNTER — Ambulatory Visit (INDEPENDENT_AMBULATORY_CARE_PROVIDER_SITE_OTHER): Payer: Managed Care, Other (non HMO) | Admitting: Family Medicine

## 2022-04-09 ENCOUNTER — Ambulatory Visit
Admission: RE | Admit: 2022-04-09 | Discharge: 2022-04-09 | Disposition: A | Discharge status: Home / Self Care | Payer: Managed Care, Other (non HMO) | Attending: Family Medicine | Admitting: Family Medicine

## 2022-04-09 VITALS — BP 124/84 | HR 96 | Ht 68.0 in | Wt 284.0 lb

## 2022-04-09 DIAGNOSIS — M5412 Radiculopathy, cervical region: Secondary | ICD-10-CM | POA: Insufficient documentation

## 2022-04-09 DIAGNOSIS — M25812 Other specified joint disorders, left shoulder: Secondary | ICD-10-CM

## 2022-04-09 MED ORDER — TIZANIDINE HCL 4 MG PO TABS: 4.0000 mg | ORAL_TABLET | Freq: Three times a day (TID) | ORAL | 0 refills | Status: DC | PRN

## 2022-04-09 MED ORDER — PREDNISONE 50 MG PO TABS: 50.0000 mg | ORAL_TABLET | Freq: Every day | ORAL | 0 refills | Status: DC

## 2022-04-09 NOTE — Progress Notes (Signed)
     Primary Care / Sports Medicine Office Visit  Patient Information:  Patient ID: Joel [name], male DOB: [birth year] Age: 42 y.o. MRN: [id number]   REED DIDOMENICO is a pleasant 42 y.o. male presenting with the following:  Chief Complaint  Patient presents with   Shoulder Pain    Vitals:   04/09/22 0841  BP: 124/84  Pulse: 96  SpO2: 96%   Vitals:   04/09/22 0841  Weight: 284 lb (128.8 kg)  Height: 5' 8"$  (1.727 m)   Body mass index is 43.18 kg/m.  No results found.   Independent interpretation of notes and tests performed by another provider:   None  Procedures performed:   None  Pertinent History, Exam, Impression, and Recommendations:   Joel [name] was seen today for shoulder pain.  Impingement of left shoulder Assessment & Plan: Acute on chronic condition involving the left lateral shoulder, intermittent radiation to the fingertips, aggravated with ADLs, overhead, reaching.  Fortunately patient has noted excellent response from previously prescribed gabapentin 100 mg nightly.  Examination with tenderness about the left levator scapula and left greater than right trapezius regions, negative Spurling's noted, he does have positive empty can and weakness, 4/5 with isolated supraspinatus testing, positive impingement, remaining and contralateral exam benign.  Primary focality to the left rotator cuff where there is impingement, involvement of the supraspinatus noted, less likely secondary/compensatory cervical spine involvement given subjective features.  Given patient's comorbid medical conditions, x-rays of the cervical spine and left shoulder, plan for as needed tizanidine, as needed topical diclofenac, home exercises, and close follow-up for reevaluation.  Suboptimal progress to be addressed with consideration of subacromial corticosteroid injection.  Orders: -     DG Shoulder Left; Future -     tiZANidine HCl; Take 1 tablet (4 mg total) by mouth every 8 (eight) hours as  needed for muscle spasms.  Dispense: 30 tablet; Refill: 0 -     predniSONE; Take 1 tablet (50 mg total) by mouth daily.  Dispense: 5 tablet; Refill: 0  Cervical radiculitis -     DG Cervical Spine Complete; Future -     tiZANidine HCl; Take 1 tablet (4 mg total) by mouth every 8 (eight) hours as needed for muscle spasms.  Dispense: 30 tablet; Refill: 0 -     predniSONE; Take 1 tablet (50 mg total) by mouth daily.  Dispense: 5 tablet; Refill: 0     Orders & Medications Meds ordered this encounter  Medications   tiZANidine (ZANAFLEX) 4 MG tablet    Sig: Take 1 tablet (4 mg total) by mouth every 8 (eight) hours as needed for muscle spasms.    Dispense:  30 tablet    Refill:  0   predniSONE (DELTASONE) 50 MG tablet    Sig: Take 1 tablet (50 mg total) by mouth daily.    Dispense:  5 tablet    Refill:  0   Orders Placed This Encounter  Procedures   DG Cervical Spine Complete   DG Shoulder Left     Return in about 6 weeks (around 05/21/2022).     Joel Culver, MD, [facility]   Primary Care Sports Medicine Primary Care and Sports Medicine at [facility]

## 2022-04-09 NOTE — Assessment & Plan Note (Signed)
Acute on chronic condition involving the left lateral shoulder, intermittent radiation to the fingertips, aggravated with ADLs, overhead, reaching.  Fortunately patient has noted excellent response from previously prescribed gabapentin 100 mg nightly.  Examination with tenderness about the left levator scapula and left greater than right trapezius regions, negative Spurling's noted, he does have positive empty can and weakness, 4/5 with isolated supraspinatus testing, positive impingement, remaining and contralateral exam benign.  Primary focality to the left rotator cuff where there is impingement, involvement of the supraspinatus noted, less likely secondary/compensatory cervical spine involvement given subjective features.  Given patient's comorbid medical conditions, x-rays of the cervical spine and left shoulder, plan for as needed tizanidine, as needed topical diclofenac, home exercises, and close follow-up for reevaluation.  Suboptimal progress to be addressed with consideration of subacromial corticosteroid injection.

## 2022-04-09 NOTE — Patient Instructions (Addendum)
-   Continue gabapentin x 3 weeks nightly then nightly as-needed - Use Voltaren gel (topical diclofenac) 4 times daily as-needed - Use tizanidine as-needed - If symptoms become severe use prednisone - Start home exercises:

## 2022-04-11 NOTE — Assessment & Plan Note (Signed)
Cervical radicular features reported, see additional assessment(s) for plan details.

## 2022-04-18 ENCOUNTER — Other Ambulatory Visit (HOSPITAL_COMMUNITY): Payer: Self-pay

## 2022-05-09 ENCOUNTER — Other Ambulatory Visit: Payer: Self-pay | Admitting: Family Medicine

## 2022-05-09 DIAGNOSIS — K219 Gastro-esophageal reflux disease without esophagitis: Secondary | ICD-10-CM

## 2022-05-09 NOTE — Telephone Encounter (Signed)
Requested Prescriptions  Pending Prescriptions Disp Refills   pantoprazole (PROTONIX) 40 MG tablet [Pharmacy Med Name: Pantoprazole Sodium 40 MG Oral Tablet Delayed Release] 90 tablet 0    Sig: TAKE 1 TABLET BY MOUTH ONCE DAILY BEFORE BREAKFAST - STOP OMEPRAZOLE     Gastroenterology: Proton Pump Inhibitors Passed - 05/09/2022  5:19 AM      Passed - Valid encounter within last 12 months    Recent Outpatient Visits           1 month ago Impingement of left shoulder   [facility] [place], Earley Abide, MD   2 months ago Chronic left shoulder pain   [place], DO   5 months ago Acute renal failure superimposed on stage 3a chronic kidney disease, unspecified acute renal failure type [facility])   [facility] [name], DO   9 months ago Bursitis of right shoulder   [place], DO   1 year ago Seasonal allergic rhinitis due to other allergic trigger   [place], DO       Future Appointments             In 1 week Zigmund Daniel, Earley Abide, MD [place] [facility]   In 2 weeks Sharolyn Douglas, Clance Boll, NP [place] [facility]

## 2022-05-12 ENCOUNTER — Other Ambulatory Visit: Payer: Self-pay | Admitting: Cardiovascular Disease

## 2022-05-12 DIAGNOSIS — I110 Hypertensive heart disease with heart failure: Secondary | ICD-10-CM

## 2022-05-21 ENCOUNTER — Ambulatory Visit: Payer: Medicare Other | Admitting: Family Medicine

## 2022-05-22 ENCOUNTER — Ambulatory Visit: Payer: Medicare Other | Admitting: Nurse Practitioner

## 2022-05-29 ENCOUNTER — Ambulatory Visit: Payer: Medicare Other | Admitting: Nurse Practitioner

## 2022-05-29 ENCOUNTER — Ambulatory Visit: Payer: Managed Care, Other (non HMO)

## 2022-05-30 ENCOUNTER — Ambulatory Visit (INDEPENDENT_AMBULATORY_CARE_PROVIDER_SITE_OTHER): Payer: Managed Care, Other (non HMO) | Admitting: Family Medicine

## 2022-05-30 ENCOUNTER — Other Ambulatory Visit: Payer: Self-pay | Admitting: Radiology

## 2022-05-30 ENCOUNTER — Encounter: Payer: Self-pay | Admitting: Family Medicine

## 2022-05-30 VITALS — BP 122/82 | HR 96 | Ht 67.0 in | Wt 289.0 lb

## 2022-05-30 DIAGNOSIS — M25812 Other specified joint disorders, left shoulder: Secondary | ICD-10-CM

## 2022-05-30 NOTE — Patient Instructions (Signed)
You have just been given a cortisone injection to reduce pain and inflammation. After the injection you may notice immediate relief of pain as a result of the Lidocaine. It is important to rest the area of the injection for 24 to 48 hours after the injection. There is a possibility of some temporary increased discomfort and swelling for up to 72 hours until the cortisone begins to work. If you do have pain, simply rest the joint and use ice. If you can tolerate over the counter medications, you can try Tylenol for added relief per package instructions. -Can use Flexeril at bedtime as needed for muscle spasms - Referral order will contact you to schedule visits with both neurosurgery and orthopedic surgery -Follow-up as needed 

## 2022-06-02 NOTE — Assessment & Plan Note (Signed)
Acute on chronic, still symptomatic despite prior treatments with report of initial improvement followed by recurrence. Exam features stable.  - Patient elected to proceed with ultrasound guided subacromial injection on left - Post care and home rehab advised - Can follow-up as-needed

## 2022-06-02 NOTE — Progress Notes (Signed)
     Primary Care / Sports Medicine Office Visit  Patient Information:  Patient ID: Joel [name], male DOB: 1980/04/27 Age: 42 y.o. MRN: [id number]   Joel [name] is a pleasant 42 y.o. male presenting with the following:  Chief Complaint  Patient presents with   Shoulder Pain    Unsure of taking medication, has started going to gym    Vitals:   05/30/22 1349  BP: 122/82  Pulse: 96  SpO2: 98%   Vitals:   05/30/22 1349  Weight: 289 lb (131.1 kg)  Height:  (1.702 m)   Body mass index is 45.26 kg/m.  No results found.   Independent interpretation of notes and tests performed by another provider:   None  Procedures performed:   Procedure:  Injection of left subacromial space under ultrasound guidance. Ultrasound guidance utilized for in-plane approach Samsung HS60 device utilized with permanent recording / reporting. Verbal informed consent obtained and verified. Skin prepped in a sterile fashion. Ethyl chloride for topical local analgesia.  Completed without difficulty and tolerated well. Medication: triamcinolone acetonide 40 mg/mL suspension for injection 1 mL total and 2 mL lidocaine 1% without epinephrine utilized for needle placement anesthetic Advised to contact for fevers/chills, erythema, induration, drainage, or persistent bleeding.   Pertinent History, Exam, Impression, and Recommendations:   Joel [name] was seen today for shoulder pain.  Impingement of left shoulder Assessment & Plan: Acute on chronic, still symptomatic despite prior treatments with report of initial improvement followed by recurrence. Exam features stable.  - Patient elected to proceed with ultrasound guided subacromial injection on left - Post care and home rehab advised - Can follow-up as-needed  Orders: -     [place] LIMITED JOINT SPACE STRUCTURES UP LEFT; Future     Orders & Medications No orders of the defined types were placed in this encounter.  Orders Placed This Encounter   Procedures   [place] LIMITED JOINT SPACE STRUCTURES UP LEFT     No follow-ups on file.     Joel Banana, MD, [facility]   Primary Care Sports Medicine Primary Care and Sports Medicine at [facility]

## 2022-06-05 ENCOUNTER — Telehealth: Payer: Self-pay | Admitting: Family Medicine

## 2022-06-05 NOTE — Telephone Encounter (Signed)
Pt's wife Shanda Bumps is calling in because pt was seen at the dentist and the dentist said that pt had small blood clots. Per Shanda Bumps, the dentist said they weren't concerning and instructed pt to let their PCP know just in case he wanted them to push back their next appointment. Dentist says the clots are small strands and there are some that are dime sized. Please follow up with pt or pt's wife.

## 2022-06-05 NOTE — Telephone Encounter (Signed)
I am not quite following this report. Where were the blood clots? I would suggest if they can have Dentist send [place] a copy of their findings or test results or documentation, so I can understand this better.  Thanks!  Saralyn Pilar, DO [facility] 06/05/2022, 3:05 PM

## 2022-06-05 NOTE — Telephone Encounter (Signed)
Reviewed faxed note. Dentist did some cleaning and resulted in significant blood clots building up with bleeding from gums.   Called patient back, advised okay to hold plavix for 5-7 days now. He can hold for 6 days to the next cleaning 06/11/22. Okay to proceed if bleeding manageable - that will be up to the dentist.  He can restart plavix 1 day later after procedure.  Written on the document, it can be faxed back to dentist office if they need our approval as well  Saralyn Pilar, DO [facility] 06/05/2022, 7:14 PM

## 2022-06-05 NOTE — Telephone Encounter (Signed)
Shanda Bumps advised.  She is going to have the dentist fax documentation to you.   Thanks,   -Vernona Rieger

## 2022-06-10 ENCOUNTER — Ambulatory Visit: Payer: Self-pay

## 2022-06-10 NOTE — Patient Instructions (Signed)
Visit Information  Thank you for taking time to visit with me today. Please don't hesitate to contact me if I can be of assistance to you.   Following are the goals we discussed today:  -Contact your primary care provider as needed   If you are experiencing a Mental Health or Behavioral Health Crisis or need someone to talk to, please call 1-800-273-TALK (toll free, 24 hour hotline)  Patient verbalizes understanding of instructions and care plan provided today and agrees to view in MyChart. Active MyChart status and patient understanding of how to access instructions and care plan via MyChart confirmed with patient.     No further follow up required: Please contact the care coordination team as needed  Bevelyn Ngo, Kenard Gower, CDP Social Worker, Certified Dementia Practitioner [name]  Care Coordination [phone]

## 2022-06-10 NOTE — Patient Outreach (Signed)
  Care Coordination   Initial Visit Note   06/10/2022 Name: Joel [name] MRN: [id number] DOB: Jul 11, 1980  Joel [name] is a 42 y.o. year old male who sees [name], DO for primary care. I spoke with  Joel [name] by phone today.  What matters to the patients health and wellness today?  No concerns, doing well at this time.    Goals Addressed             This Visit's Progress    COMPLETED: Care Coordination Activities       Care Coordination Interventions: SDoH screening performed - no acute resource challenges identified at this time Determined the patient does not have concerns with medication costs and/or adherence at this time Education provided on the role of the care coordination team - no follow up desired at this time Encouraged the patient to contact his primary care provider as needed         SDOH assessments and interventions completed:  Yes  SDOH Interventions Today    Flowsheet Row Most Recent Value  SDOH Interventions   Food Insecurity Interventions Intervention Not Indicated  Housing Interventions Intervention Not Indicated  Transportation Interventions Intervention Not Indicated  Utilities Interventions Intervention Not Indicated        Care Coordination Interventions:  Yes, provided   Interventions Today    Flowsheet Row Most Recent Value  Chronic Disease   Chronic disease during today's visit Hypertension (HTN), Congestive Heart Failure (CHF), Diabetes  General Interventions   General Interventions Discussed/Reviewed General Interventions Discussed, Doctor Visits  Doctor Visits Discussed/Reviewed Doctor Visits Reviewed  Education Interventions   Education Provided Provided Education  Provided Verbal Education On Other  [the role of the care coordination team]        Follow up plan: No further intervention required.   Encounter Outcome:  Pt. Visit Completed   Bevelyn Ngo, BSW, CDP Social Worker, Certified Dementia  Practitioner [facility] Care Management  Care Coordination [phone]

## 2022-06-12 ENCOUNTER — Ambulatory Visit (INDEPENDENT_AMBULATORY_CARE_PROVIDER_SITE_OTHER): Payer: Medicare Other

## 2022-06-12 DIAGNOSIS — G4733 Obstructive sleep apnea (adult) (pediatric): Secondary | ICD-10-CM | POA: Diagnosis not present

## 2022-06-12 NOTE — Progress Notes (Signed)
95 percentile pressure 12.9   95th percentile leak 12.9   apnea index 0.5 /hr  apnea-hypopnea index  0.6 /hr   total days used  >4 hr 90 days  total days used <4 hr 0 days  Total compliance 100 percent  He is doing really great, no problems or questions at this time.   Pt was seen by Tresa Endo  RRT/RCP  from [facility]

## 2022-06-19 ENCOUNTER — Ambulatory Visit: Payer: Medicare Other | Attending: Nurse Practitioner | Admitting: Nurse Practitioner

## 2022-06-19 ENCOUNTER — Encounter: Payer: Self-pay | Admitting: Nurse Practitioner

## 2022-06-19 ENCOUNTER — Ambulatory Visit: Payer: Managed Care, Other (non HMO) | Admitting: Family Medicine

## 2022-06-19 ENCOUNTER — Ambulatory Visit (INDEPENDENT_AMBULATORY_CARE_PROVIDER_SITE_OTHER): Payer: Medicare Other | Admitting: Nurse Practitioner

## 2022-06-19 ENCOUNTER — Encounter: Payer: Self-pay | Admitting: Family Medicine

## 2022-06-19 ENCOUNTER — Telehealth (INDEPENDENT_AMBULATORY_CARE_PROVIDER_SITE_OTHER): Payer: Managed Care, Other (non HMO) | Admitting: Family Medicine

## 2022-06-19 VITALS — BP 120/90 | HR 84 | Ht 67.0 in | Wt 284.0 lb

## 2022-06-19 VITALS — BP 140/90 | HR 100 | Temp 98.1°F | Resp 16 | Ht 67.0 in | Wt 286.4 lb

## 2022-06-19 VITALS — Ht 67.0 in | Wt 300.0 lb

## 2022-06-19 DIAGNOSIS — Z6841 Body Mass Index (BMI) 40.0 and over, adult: Secondary | ICD-10-CM

## 2022-06-19 DIAGNOSIS — N1832 Chronic kidney disease, stage 3b: Secondary | ICD-10-CM | POA: Diagnosis not present

## 2022-06-19 DIAGNOSIS — Z7189 Other specified counseling: Secondary | ICD-10-CM

## 2022-06-19 DIAGNOSIS — E1121 Type 2 diabetes mellitus with diabetic nephropathy: Secondary | ICD-10-CM | POA: Diagnosis not present

## 2022-06-19 DIAGNOSIS — I10 Essential (primary) hypertension: Secondary | ICD-10-CM | POA: Diagnosis not present

## 2022-06-19 DIAGNOSIS — I5022 Chronic systolic (congestive) heart failure: Secondary | ICD-10-CM | POA: Diagnosis not present

## 2022-06-19 DIAGNOSIS — I420 Dilated cardiomyopathy: Secondary | ICD-10-CM | POA: Insufficient documentation

## 2022-06-19 DIAGNOSIS — G4733 Obstructive sleep apnea (adult) (pediatric): Secondary | ICD-10-CM | POA: Diagnosis not present

## 2022-06-19 DIAGNOSIS — Z7985 Long-term (current) use of injectable non-insulin antidiabetic drugs: Secondary | ICD-10-CM

## 2022-06-19 NOTE — Progress Notes (Signed)
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  Past Medical History    Past Medical History:  Diagnosis Date   Anxiety    Chest pain    a. 02/2017 MV: fixed mid ant/inferoapical defects consistent w/ attenuation. No ischemia.   Chronic diastolic CHF (congestive heart failure) (HCC)    a. 06/2014 Echo: EF 45-50%; b. 02/2017 Echo: EF 25-30%, Gr2 DD; c. 01/2019 Echo: EF 55-60%; d. 03/2019 Echo: EF 55-60%, GrI DD, no rwma, sev LVH, nl RV fxn, mld TR, mild Ao sclerosis w/o stenosis.   CKD (chronic kidney disease) stage 3, GFR 30-59 ml/min (HCC)    Constipation    Depression    GERD (gastroesophageal reflux disease)    Hyperlipidemia    Hypertensive heart disease    Lactose intolerance    NICM (nonischemic cardiomyopathy) (HCC)    a. 06/2014 Echo: EF 45-50%; b. 02/2017 Echo: EF 25-30%, Gr2 DD; c. 02/2017 MV: EF 17%, fixed mid ant/inferoapical defects consistent w/ attenuation. No ischemia; d. 01/2019 Echo: EF 55-60%; e. 03/2019 Echo: EF 55-60%, GrI DD.   Noncompliance with medications    Obesity    Palpitations    Sleep apnea    Stroke [facility])    Past Surgical History:  Procedure Laterality Date   NO PAST SURGERIES     XI ROBOTIC LAPAROSCOPIC ASSISTED APPENDECTOMY N/A 11/08/2021   Procedure: XI ROBOTIC LAPAROSCOPIC ASSISTED APPENDECTOMY;  Surgeon: Campbell Lerner, MD;  Location: [facility] ORS;  Service: General;  Laterality: N/A;    Allergies  Allergies  Allergen Reactions   Bidil [Isosorb Dinitrate-Hydralazine] Other  (See Comments)    Severe headache   Nitroglycerin Other (See Comments)    Causes severe headaches    History of Present Illness    42 year old male with above past medical history including ischemic cardiomyopathy, hypertensive heart disease, chronic heart failure with improved ejection fraction, stage III-IV chronic kidney disease, type 2 diabetes mellitus, hyperlipidemia, obesity, sleep apnea, and stroke.  He was initially diagnosed with cardiomyopathy in May 2016, at which time an echo showed an EF of 45 to 50%.  In January 2019, he was noted to have further drop in EF to 25 to 30%.  Stress testing was undertaken and showed fixed anterior and inferoapical defects consistent with attenuation.  No ischemia was noted.  He was medically managed.  He was subsequently noted to have improvement in EF by echo December 2020 (55-60%), with most recent echo in February 2021, performed in the setting of hospitalization for stroke (small acute infarct involving the right middle cerebellar peduncle), showing an EF of 55 to 60% with severe LVH, and grade 1 diastolic dysfunction.  Mr. Vandongen was last seen in cardiology clinic in January 2024, at which time he was doing well from a cardiovascular standpoint with stable blood pressure and euvolemia.  Over the past year, he is continue to do well.  He goes to the gym 5 days a week, mostly focusing on weight training, though he does some walking on the treadmill.  He is disappointed by not being able to lose weight  and notes that his diet is poor.  He and his significant other are about to enroll in an herbal life program with a focus on weight loss.  He denies chest pain, dyspnea, palpitations, PND, orthopnea, dizziness, syncope, edema, or early satiety.  Home Medications    Current Outpatient Medications  Medication Sig Dispense Refill   Alirocumab (PRALUENT) 75 MG/ML SOAJ Inject 1 Pen into the skin every 14 (fourteen) days. 6 mL 3   amLODipine (NORVASC) 10 MG  tablet Take 1 tablet by mouth once daily 90 tablet 2   carvedilol (COREG) 25 MG tablet Take 1 tablet (25 mg total) by mouth 2 (two) times daily with a meal. 180 tablet 3   clopidogrel (PLAVIX) 75 MG tablet Take 1 tablet (75 mg total) by mouth daily. 90 tablet 3   ezetimibe (ZETIA) 10 MG tablet Take 1 tablet (10 mg total) by mouth daily. 90 tablet 3   furosemide (LASIX) 20 MG tablet Take 1 tablet (20 mg total) by mouth daily as needed. For shortness of breath 90 tablet 3   gabapentin (NEURONTIN) 100 MG capsule Take 100 mg by mouth daily.     JARDIANCE 25 MG TABS tablet Take 25 mg by mouth daily.     montelukast (SINGULAIR) 10 MG tablet TAKE 1 TABLET BY MOUTH AT BEDTIME 90 tablet 0   OZEMPIC, 2 MG/DOSE, 8 MG/3ML SOPN Inject 2 mg into the skin once a week.     pantoprazole (PROTONIX) 40 MG tablet TAKE 1 TABLET BY MOUTH ONCE DAILY BEFORE BREAKFAST - STOP OMEPRAZOLE 90 tablet 0   rosuvastatin (CRESTOR) 40 MG tablet Take 1 tablet (40 mg total) by mouth daily. 90 tablet 3   tiZANidine (ZANAFLEX) 4 MG tablet Take 1 tablet (4 mg total) by mouth every 8 (eight) hours as needed for muscle spasms. 30 tablet 0   topiramate (TOPAMAX) 100 MG tablet Take 100 mg by mouth at bedtime.     TRESIBA FLEXTOUCH 100 UNIT/ML FlexTouch Pen Inject 18 Units into the skin daily.     No current facility-administered medications for this visit.     Review of Systems    He denies chest pain, palpitations, dyspnea, pnd, orthopnea, n, v, dizziness, syncope, edema, weight gain, or early satiety.  All other systems reviewed and are otherwise negative except as noted above.    Physical Exam    VS:  BP (!) 120/90 (BP Location: Left Arm, Patient Position: Sitting, Cuff Size: Large)   Pulse 84   Ht 5\' 7"  (1.702 m)   Wt 284 lb (128.8 kg)   SpO2 98%   BMI 44.48 kg/m  , BMI Body mass index is 44.48 kg/m.     GEN: Well nourished, well developed, in no acute distress. HEENT: normal. Neck: Supple, no JVD, carotid bruits, or  masses. Cardiac: RRR, no murmurs, rubs, or gallops. No clubbing, cyanosis, edema.  Radials 2+/PT 2+ and equal bilaterally.  Respiratory:  Respirations regular and unlabored, clear to auscultation bilaterally. GI: Soft, nontender, nondistended, BS + x 4. MS: no deformity or atrophy. Skin: warm and dry, no rash. Neuro:  Strength and sensation are intact. Psych: Normal affect.  Accessory Clinical Findings     Lab Results  Component Value Date   WBC 4.7 11/26/2021   HGB 13.5 11/26/2021   HCT 41.7 11/26/2021   MCV 84.1 11/26/2021   PLT 245 11/26/2021   Lab Results  Component Value Date   CREATININE 1.59 (H) 12/25/2021   BUN 21 12/25/2021  NA 142 12/25/2021   K 3.7 12/25/2021   CL 105 12/25/2021   CO2 27 12/25/2021   Lab Results  Component Value Date   ALT 17 11/26/2021   AST 15 11/26/2021   ALKPHOS 68 11/07/2021   BILITOT 0.4 11/26/2021   Lab Results  Component Value Date   CHOL 141 09/19/2021   HDL 28 (L) 09/19/2021   LDLCALC 87 09/19/2021   TRIG 145 09/19/2021   CHOLHDL 5.0 09/19/2021    Lab Results  Component Value Date   HGBA1C 5.7 (H) 11/07/2021    Assessment & Plan    1.  Chronic heart failure with improved ejection fraction/nonischemic cardiomyopathy: EF previously as low as 25 to 30% in 2019 with stress testing at that time showed no evidence of ischemia.  Most recent echo in February 2021 showed an EF of 55 to 60% with grade 1 diastolic dysfunction and severe LVH.  Over the past year, he has done well without chest pain, dyspnea, or edema.  He is euvolemic on examination today.  He has been exercising regularly without symptoms or limitations.  He remains on beta-blocker, Jardiance.  No ACE/ARB/arni/mra secondary to chronic kidney disease.  He did not previously tolerate hydralazine/nitrates.  2.  Essential hypertension: Blood pressure stable today.  Continue current regimen.  3.  Hyperlipidemia: On rosuvastatin, Zetia, and Praluent with an LDL of 72 and  March.  Normal LFTs at that time.  4.  Diabetes mellitus: Followed closely by endocrinology.  A1c was 7.7 in March.  Working on weight loss.  5.  Stage III-IV chronic kidney disease: Creatinine relatively stable at 1.8 in March.  On empagliflozin.  6.  Morbid obesity: Patient is exercising regularly but notes that he often will binge for his 1 meal a day.  Enrolling in an herbal life program.  Discussed the importance of learning portion control and incorporating healthy lifestyle habits that would be sustainable.  7.  Disposition: Follow-up in clinic in 1 year or sooner if necessary.   Nicolasa Ducking, NP 06/19/2022, 6:39 PM

## 2022-06-19 NOTE — Progress Notes (Signed)
[facility] [address] [place], [place] [address]  Internal MEDICINE  Office Visit Note  Patient Name: Joel [name]  [id number]  [id number]  Date of Service: 06/19/2022  Chief Complaint  Patient presents with   Follow-up    HPI Oley presents for a follow up visit for OSA and CPAP use. 100% compliance  --does feel rested in the morning. Normal energy level during the day, epworth score is unchanged at 2 out of 24. uses his CPAP during naps if he happens to take one, this still does not happen often.  CPAP Download 95 percentile pressure 12.9 95th percentile leak 12.9 apnea index 0.5 /hr apnea-hypopnea index  0.6 /hr total days used  >4 hr 90 days total days used <4 hr 0 days Total compliance 100 percent   no problems or questions at this time. Pt was seen by Tresa Endo  RRT[facility]RCP  from [facility] Does not need any supplies.    Current Medication: Outpatient Encounter Medications as of 06/19/2022  Medication Sig   Alirocumab (PRALUENT) 75 MG/ML SOAJ Inject 1 Pen into the skin every 14 (fourteen) days.   amLODipine (NORVASC) 10 MG tablet Take 1 tablet by mouth once daily   carvedilol (COREG) 25 MG tablet Take 1 tablet (25 mg total) by mouth 2 (two) times daily with a meal.   clopidogrel (PLAVIX) 75 MG tablet Take 1 tablet (75 mg total) by mouth daily.   ezetimibe (ZETIA) 10 MG tablet Take 1 tablet (10 mg total) by mouth daily.   furosemide (LASIX) 20 MG tablet Take 1 tablet (20 mg total) by mouth daily as needed. For shortness of breath   gabapentin (NEURONTIN) 100 MG capsule Take 100 mg by mouth daily.   JARDIANCE 25 MG TABS tablet Take 25 mg by mouth daily.   montelukast (SINGULAIR) 10 MG tablet TAKE 1 TABLET BY MOUTH AT BEDTIME   OZEMPIC, 2 MG/DOSE, 8 MG/3ML SOPN Inject 2 mg into the skin once a week.   pantoprazole (PROTONIX) 40 MG tablet TAKE 1 TABLET BY MOUTH ONCE DAILY BEFORE BREAKFAST - STOP OMEPRAZOLE   rosuvastatin (CRESTOR) 40 MG tablet Take 1 tablet (40 mg  total) by mouth daily.   tiZANidine (ZANAFLEX) 4 MG tablet Take 1 tablet (4 mg total) by mouth every 8 (eight) hours as needed for muscle spasms.   topiramate (TOPAMAX) 100 MG tablet Take 100 mg by mouth at bedtime.   TRESIBA FLEXTOUCH 100 UNIT/ML FlexTouch Pen Inject 18 Units into the skin daily.   [DISCONTINUED] amLODipine (NORVASC) 10 MG tablet Take 1 tablet by mouth once daily   [DISCONTINUED] cyclobenzaprine (FLEXERIL) 10 MG tablet TAKE 1 TABLET BY MOUTH TWICE DAILY AS NEEDED FOR MUSCLE SPASM (Patient not taking: Reported on 02/13/2022)   [DISCONTINUED] escitalopram (LEXAPRO) 10 MG tablet Take 15 mg by mouth daily. (Patient not taking: Reported on 02/13/2022)   [DISCONTINUED] gabapentin (NEURONTIN) 100 MG capsule Start 1 capsule daily, increase by 1 cap every 2-3 days as tolerated up to 3 times a day, or may take 3 at once in evening. (Patient not taking: Reported on 02/19/2022)   [DISCONTINUED] ondansetron (ZOFRAN-ODT) 4 MG disintegrating tablet Take 1 tablet (4 mg total) by mouth every 8 (eight) hours as needed for nausea or vomiting.   [DISCONTINUED] pantoprazole (PROTONIX) 40 MG tablet TAKE 1 TABLET BY MOUTH ONCE DAILY BEFORE BREAKFAST. STOP OMEPRAZOLE   [DISCONTINUED] promethazine (PHENERGAN) 25 MG tablet Take 25 mg by mouth every 4 (four) hours as needed. (Patient not taking: Reported  on 02/13/2022)   No facility-administered encounter medications on file as of 06/19/2022.    Surgical History: Past Surgical History:  Procedure Laterality Date   NO PAST SURGERIES     XI ROBOTIC LAPAROSCOPIC ASSISTED APPENDECTOMY N/A 11/08/2021   Procedure: XI ROBOTIC LAPAROSCOPIC ASSISTED APPENDECTOMY;  Surgeon: Campbell Lerner, MD;  Location: [facility] ORS;  Service: General;  Laterality: N/A;    Medical History: Past Medical History:  Diagnosis Date   Anxiety    Chest pain    a. 02/2017 MV: fixed mid ant/inferoapical defects consistent w/ attenuation. No ischemia.   Chronic diastolic CHF (congestive heart  failure) (HCC)    a. 06/2014 Echo: EF 45-50%; b. 02/2017 Echo: EF 25-30%, Gr2 DD; c. 01/2019 Echo: EF 55-60%; d. 03/2019 Echo: EF 55-60%, GrI DD, no rwma, sev LVH, nl RV fxn, mld TR, mild Ao sclerosis w/o stenosis.   CKD (chronic kidney disease) stage 3, GFR 30-59 ml/min (HCC)    Constipation    Depression    GERD (gastroesophageal reflux disease)    Hyperlipidemia    Hypertensive heart disease    Lactose intolerance    NICM (nonischemic cardiomyopathy) (HCC)    a. 06/2014 Echo: EF 45-50%; b. 02/2017 Echo: EF 25-30%, Gr2 DD; c. 02/2017 MV: EF 17%, fixed mid ant/inferoapical defects consistent w/ attenuation. No ischemia; d. 01/2019 Echo: EF 55-60%; e. 03/2019 Echo: EF 55-60%, GrI DD.   Noncompliance with medications    Obesity    Palpitations    Sleep apnea    Stroke [facility])     Family History: Family History  Problem Relation Age of Onset   Cancer Mother    Hypertension Mother    Alcoholism Father    Hyperlipidemia Father    Diabetes Father    CAD Father        a. stenting in his early 29s   Heart disease Father    Stroke Father    Heart attack Father    Diabetes Maternal Grandmother    Diabetes Maternal Grandfather    CAD Paternal Grandmother    CAD Paternal Grandfather    Colon cancer Paternal Grandfather    Diabetes Paternal Uncle     Social History   Socioeconomic History   Marital status: Married    Spouse name: Amair Quist   Number of children: 1   Years of education: 12   Highest education level: 12th grade  Occupational History   Occupation: Disabled  Tobacco Use   Smoking status: Never   Smokeless tobacco: Never  Vaping Use   Vaping Use: Every day   Substances: Nicotine, Flavoring  Substance and Sexual Activity   Alcohol use: Not Currently    Comment: Very infrequently; ~2-3 drinks/year    Drug use: No   Sexual activity: Not Currently    Birth control/protection: None  Other Topics Concern   Not on file  Social History Narrative   Not on file    Social Determinants of Health   Financial Resource Strain: Medium Risk (10/09/2021)   Overall Financial Resource Strain (CARDIA)    Difficulty of Paying Living Expenses: Somewhat hard  Food Insecurity: No Food Insecurity (06/10/2022)   Hunger Vital Sign    Worried About Running Out of Food in the Last Year: Never true    Ran Out of Food in the Last Year: Never true  Transportation Needs: No Transportation Needs (06/10/2022)   PRAPARE - Administrator, Civil Service (Medical): No    Lack of Transportation (Non-Medical):  No  Physical Activity: Inactive (10/03/2020)   Exercise Vital Sign    Days of Exercise per Week: 0 days    Minutes of Exercise per Session: 0 min  Stress: No Stress Concern Present (10/03/2020)   [employer] of Occupational Health - Occupational Stress Questionnaire    Feeling of Stress : Not at all  Social Connections: Moderately Isolated (10/09/2021)   Social Connection and Isolation Panel [NHANES]    Frequency of Communication with Friends and Family: Twice a week    Frequency of Social Gatherings with Friends and Family: Once a week    Attends Religious Services: Never    Database administrator or Organizations: No    Attends Banker Meetings: Never    Marital Status: Married  Catering manager Violence: Not At Risk (11/08/2021)   Humiliation, Afraid, Rape, and Kick questionnaire    Fear of Current or Ex-Partner: No    Emotionally Abused: No    Physically Abused: No    Sexually Abused: No      Review of Systems  Constitutional:  Negative for chills, fatigue and unexpected weight change.  HENT:  Negative for congestion, rhinorrhea, sneezing and sore throat.   Eyes:  Negative for redness.  Respiratory:  Negative for cough, chest tightness and shortness of breath.   Cardiovascular:  Negative for chest pain and palpitations.  Gastrointestinal:  Negative for abdominal pain, constipation, diarrhea, nausea and vomiting.   Genitourinary:  Negative for dysuria and frequency.  Musculoskeletal:  Negative for arthralgias, back pain, joint swelling and neck pain.  Skin:  Negative for rash.  Neurological: Negative.  Negative for tremors and numbness.  Hematological:  Negative for adenopathy. Does not bruise/bleed easily.  Psychiatric/Behavioral:  Negative for behavioral problems (Depression), sleep disturbance and suicidal ideas. The patient is not nervous/anxious.     Vital Signs: BP (!) 140/90   Pulse 100   Temp 98.1 F (36.7 C)   Resp 16   Ht 5\' 7"  (1.702 m)   Wt 286 lb 6.4 oz (129.9 kg)   SpO2 96%   BMI 44.86 kg/m    Physical Exam Vitals reviewed.  Constitutional:      General: He is not in acute distress.    Appearance: Normal appearance. He is obese. He is not ill-appearing.  HENT:     Head: Normocephalic and atraumatic.  Eyes:     Pupils: Pupils are equal, round, and reactive to light.  Cardiovascular:     Rate and Rhythm: Normal rate and regular rhythm.  Pulmonary:     Effort: Pulmonary effort is normal. No respiratory distress.  Neurological:     Mental Status: He is alert and oriented to person, place, and time.  Psychiatric:        Mood and Affect: Mood normal.        Behavior: Behavior normal.        Assessment/Plan: 1. OSA on CPAP 100% compliance.  no concerns or issues. doing well. Follow up in 6 months.   2. CPAP use counseling Does not need any supplies. Encouraged to keep up with routine maintenance and cleaning of the CPAP machine, patient does not have any questions regarding maintenance or cleaning of the machine.    General Counseling: connie bison understanding of the findings of todays visit and agrees with plan of treatment. I have discussed any further diagnostic evaluation that may be needed or ordered today. We also reviewed his medications today. he has been encouraged to call the office  with any questions or concerns that should arise related to todays  visit.    No orders of the defined types were placed in this encounter.   No orders of the defined types were placed in this encounter.   Return in about 6 months (around 12/20/2022) for F/U, pulmonary/sleep, Valentin Benney PCP.   Total time spent:20 Minutes Time spent includes review of chart, medications, test results, and follow up plan with the patient.   [place] Controlled Substance Database was reviewed by me.  This patient was seen by Sallyanne Kuster, FNP-C in collaboration with Dr. Beverely Risen as a part of collaborative care agreement.   Norine Reddington R. Tedd Sias, MSN, FNP-C Internal medicine

## 2022-06-19 NOTE — Patient Instructions (Addendum)
   Please schedule a Follow-up Appointment to: Return if symptoms worsen or fail to improve.  If you have any other questions or concerns, please feel free to call the office or send a message through MyChart. You may also schedule an earlier appointment if necessary.  Additionally, you may be receiving a survey about your experience at our office within a few days to 1 week by e-mail or mail. We value your feedback.  Malva Diesing, DO [facility], [facility] 

## 2022-06-19 NOTE — Patient Instructions (Signed)
Medication Instructions:  Your Physician recommend you continue on your current medication as directed.    *If you need a refill on your cardiac medications before your next appointment, please call your pharmacy*   Lab Work: None ordered today   Testing/Procedures: None ordered today   Follow-Up: At [facility], you and your health needs are our priority.  As part of our continuing mission to provide you with exceptional heart care, we have created designated Provider Care Teams.  These Care Teams include your primary Cardiologist (physician) and Advanced Practice Providers (APPs -  Physician Assistants and Nurse Practitioners) who all work together to provide you with the care you need, when you need it.  We recommend signing up for the patient portal called "MyChart".  Sign up information is provided on this After Visit Summary.  MyChart is used to connect with patients for Virtual Visits (Telemedicine).  Patients are able to view lab/test results, encounter notes, upcoming appointments, etc.  Non-urgent messages can be sent to your provider as well.   To learn more about what you can do with MyChart, go to [email or web address].    Your next appointment:   1 year(s)  Provider:   You may see Timothy Gollan, MD or one of the following Advanced Practice Providers on your designated Care Team:   Christopher Berge, NP Ryan Dunn, PA-C Cadence Furth, PA-C Sheri Hammock, NP     

## 2022-06-19 NOTE — Progress Notes (Signed)
Subjective:    Patient ID: Joel [name], male    DOB: 1980/10/27, 42 y.o.   MRN: [id number]  Joel [name] is a 42 y.o. male presenting on 06/19/2022 for Weight Check  Virtual / Telehealth Encounter - Video Visit via MyChart The purpose of this virtual visit is to provide medical care while limiting exposure to the novel coronavirus (COVID19) for both patient and office staff.  Consent was obtained for remote visit:  Yes.   Answered questions that patient had about telehealth interaction:  Yes.   I discussed the limitations, risks, security and privacy concerns of performing an evaluation and management service by video/telephone. I also discussed with the patient that there may be a patient responsible charge related to this service. The patient expressed understanding and agreed to proceed.  Patient Location: Home Provider Location: [facility] (Office)  Participants in virtual visit: - Patient: Joel [name] - CMA: Darrol Angel CMA - Provider: Dr Althea Charon   HPI  Morbid Obesity BMI >46 Type 2 Diabetes  Interested in discussing [employer] program. He has already looked into it and requesting confirmation that it will be safe and effective for him. Concern with conflict with medications. He will send more information on the products he plans to pursue. They advised No Tea w/ caffeine. They do offer appetite suppressant but it seems to be natural. He is on GLP1 therapy already with Ozempic 2mg  weekly and doin gwell They offer nutrition supplement to accompany the GLP1 therapy he is on Meal shake program      12/19/2021    3:58 PM 11/26/2021    2:53 PM 10/09/2021    3:08 PM  Depression screen PHQ 2/9  Decreased Interest 0 2 2  Down, Depressed, Hopeless 0 2 2  PHQ - 2 Score 0 4 4  Altered sleeping  2 2  Tired, decreased energy  2 1  Change in appetite  2 3  Feeling bad or failure about yourself   3 3  Trouble concentrating  2 3  Moving slowly or  fidgety/restless  1 2  Suicidal thoughts  2 2  PHQ-9 Score  18 20  Difficult doing work/chores  Very difficult Very difficult    Social History   Tobacco Use   Smoking status: Never   Smokeless tobacco: Never  Vaping Use   Vaping Use: Every day   Substances: Nicotine, Flavoring  Substance Use Topics   Alcohol use: Not Currently    Comment: Very infrequently; ~2-3 drinks/year    Drug use: No    Review of Systems Per HPI unless specifically indicated above     Objective:    Ht 5\' 7"  (1.702 m)   Wt 300 lb (136.1 kg)   BMI 46.99 kg/m   Wt Readings from Last 3 Encounters:  06/19/22 300 lb (136.1 kg)  05/30/22 289 lb (131.1 kg)  04/09/22 284 lb (128.8 kg)    Physical Exam  Note examination was completely remotely via video observation objective data only  Gen - well-appearing, no acute distress or apparent pain, comfortable HEENT - eyes appear clear without discharge or redness Heart/Lungs - cannot examine virtually - observed no evidence of coughing or labored breathing. Abd - cannot examine virtually  Skin - face visible today- no rash Neuro - awake, alert, oriented Psych - not anxious appearing   Results for orders placed or performed in visit on 12/25/21  BASIC METABOLIC PANEL WITH GFR  Result Value Ref Range  Glucose, Bld 96 65 - 99 mg/dL   BUN 21 7 - 25 mg/dL   Creat 8.65 (H) 7.84 - 1.29 mg/dL   eGFR 56 (L) > OR = 60 mL/min/1.74m2   BUN/Creatinine Ratio 13 6 - 22 (calc)   Sodium 142 135 - 146 mmol/L   Potassium 3.7 3.5 - 5.3 mmol/L   Chloride 105 98 - 110 mmol/L   CO2 27 20 - 32 mmol/L   Calcium 9.2 8.6 - 10.3 mg/dL      Assessment & Plan:   Problem List Items Addressed This Visit     Morbid obesity with BMI of 40.0-44.9, adult (HCC) - Primary   Type 2 diabetes mellitus with diabetic nephropathy (HCC)    Discussion on weight management today Weight at 300 lbs based on reported weight He continues Ozempic 2mg  weekly inj He is interested in  pursuing [employer] program. I reviewed it online and discussed with him and he contacted them regarding ingredients. Questions were answered. I am okay if he pursues this nutrition program. We discussed precautions with some appetite suppressant compounds, he will send me a list of products he would plan to start before he does. I advised the main one I would use caution with and avoid is Phentermine containing compound. Otherwise natural options as described should be fine.  No orders of the defined types were placed in this encounter.     Follow up plan: Return if symptoms worsen or fail to improve.   Patient verbalizes understanding with the above medical recommendations including the limitation of remote medical advice.  Specific follow-up and call-back criteria were given for patient to follow-up or seek medical care more urgently if needed.  Total duration of direct patient care provided via video conference: 10 minutes   Saralyn Pilar, DO [facility] 06/19/2022, 11:52 AM

## 2022-06-30 ENCOUNTER — Encounter: Payer: Self-pay | Admitting: Nurse Practitioner

## 2022-07-03 ENCOUNTER — Other Ambulatory Visit (HOSPITAL_COMMUNITY): Payer: Self-pay

## 2022-07-03 ENCOUNTER — Telehealth: Payer: Self-pay

## 2022-07-03 NOTE — Telephone Encounter (Signed)
Pharmacy Patient Advocate Encounter   Received notification from [facility] MEDICARE that prior authorization for PRALUENT is needed.    PA submitted on 07/03/22 Key [id number] Status is pending  Haze Rushing, CPhT Pharmacy Patient Advocate Specialist Direct Number: [phone] Fax: [phone]

## 2022-07-04 NOTE — Telephone Encounter (Signed)
Pharmacy Patient Advocate Encounter  Prior Authorization for PRALUENT has been approved.    Effective dates: 06/19/22 through 02/10/98   Haze Rushing, CPhT Pharmacy Patient Advocate Specialist Direct Number: [phone] Fax: [phone]

## 2022-07-29 ENCOUNTER — Other Ambulatory Visit: Payer: Self-pay | Admitting: Family Medicine

## 2022-07-29 DIAGNOSIS — K219 Gastro-esophageal reflux disease without esophagitis: Secondary | ICD-10-CM

## 2022-07-30 NOTE — Telephone Encounter (Signed)
Requested Prescriptions  Pending Prescriptions Disp Refills   pantoprazole (PROTONIX) 40 MG tablet [Pharmacy Med Name: Pantoprazole Sodium 40 MG Oral Tablet Delayed Release] 90 tablet 0    Sig: TAKE 1 TABLET BY MOUTH ONCE DAILY BEFORE BREAKFAST - STOP OMEPRAZOLE     Gastroenterology: Proton Pump Inhibitors Passed - 07/29/2022  4:37 PM      Passed - Valid encounter within last 12 months    Recent Outpatient Visits           1 month ago Morbid obesity with BMI of 40.0-44.9, adult [facility])   [facility] [name], DO   2 months ago Impingement of left shoulder   [facility], Ocie Bob, MD   3 months ago Impingement of left shoulder   [place] [facility], Ocie Bob, MD   5 months ago Chronic left shoulder pain   [facility] [facility] [name], DO   8 months ago Acute renal failure superimposed on stage 3a chronic kidney disease, unspecified acute renal failure type [facility])   [place] [facility] [place], [place]ta Ne[place], [place]

## 2022-09-17 ENCOUNTER — Ambulatory Visit (INDEPENDENT_AMBULATORY_CARE_PROVIDER_SITE_OTHER): Payer: Managed Care, Other (non HMO) | Admitting: Family Medicine

## 2022-09-17 ENCOUNTER — Encounter: Payer: Self-pay | Admitting: Family Medicine

## 2022-09-17 VITALS — BP 128/72 | HR 80 | Ht 67.0 in | Wt 258.0 lb

## 2022-09-17 DIAGNOSIS — M25812 Other specified joint disorders, left shoulder: Secondary | ICD-10-CM | POA: Diagnosis not present

## 2022-09-17 NOTE — Patient Instructions (Signed)
-   Start home/gym-based rehab with [facility] shoulder conditioning program - Can use over-the-counter topical rubs/creams and Tylenol for pain control - Can use previously prescribed tizanidine (muscle relaxer) on an as-needed basis - Can contact for any worsening pain/needing an injection next-otherwise follow-up as needed

## 2022-09-17 NOTE — Assessment & Plan Note (Signed)
Patient returns for follow-up to left shoulder impingement in the setting of rotator cuff involvement with focality to the supraspinatus.  At last visit on 06/02/2022 he received a subacromial corticosteroid injection which provided excellent symptom control with only minor recurrence as of late.  Has been active in the gym with upper body workouts, reduced resistance however with increased repetitions.  Examination shows full, minimally painful range of motion, 5/5 strength, painful, with empty can, positive impingement, additional findings at the biceps tendon.  Interval improved exam.  We reviewed various treatment strategies given his excellent subjective and objective findings.  Plan as follows: - Start home/gym-based rehab with [facility] shoulder conditioning program - Can use over-the-counter topical rubs/creams and Tylenol for pain control - Can use previously prescribed tizanidine (muscle relaxer) on an as-needed basis - Can contact for any worsening pain/needing an injection next-otherwise follow-up as needed

## 2022-09-17 NOTE — Progress Notes (Signed)
     Primary Care / Sports Medicine Office Visit  Patient Information:  Patient ID: Joel [name], male DOB: [birth year] Age: 42 y.o. MRN: [id number]   Joel [name] is a pleasant 42 y.o. male presenting with the following:  Chief Complaint  Patient presents with   Impingement of left shoulder    Vitals:   09/17/22 1412  BP: 128/72  Pulse: 80  SpO2: 98%   Vitals:   09/17/22 1412  Weight: 258 lb (117 kg)  Height: 5\' 7"  (1.702 m)   Body mass index is 40.41 kg/m.  No results found.   Independent interpretation of notes and tests performed by another provider:   None  Procedures performed:   None  Pertinent History, Exam, Impression, and Recommendations:   Joel [name] was seen today for impingement of left shoulder.  Impingement of left shoulder Assessment & Plan: Patient returns for follow-up to left shoulder impingement in the setting of rotator cuff involvement with focality to the supraspinatus.  At last visit on 06/02/2022 he received a subacromial corticosteroid injection which provided excellent symptom control with only minor recurrence as of late.  Has been active in the gym with upper body workouts, reduced resistance however with increased repetitions.  Examination shows full, minimally painful range of motion, 5/5 strength, painful, with empty can, positive impingement, additional findings at the biceps tendon.  Interval improved exam.  We reviewed various treatment strategies given his excellent subjective and objective findings.  Plan as follows: - Start home/gym-based rehab with [facility] shoulder conditioning program - Can use over-the-counter topical rubs/creams and Tylenol for pain control - Can use previously prescribed tizanidine (muscle relaxer) on an as-needed basis - Can contact for any worsening pain/needing an injection next-otherwise follow-up as needed      Orders & Medications No orders of the defined types were placed in this encounter.  No  orders of the defined types were placed in this encounter.    No follow-ups on file.     [name], MD, [facility]   Primary Care Sports Medicine Primary Care and Sports Medicine at [facility]

## 2022-10-07 ENCOUNTER — Encounter: Payer: Self-pay | Admitting: Cardiovascular Disease

## 2022-10-09 ENCOUNTER — Telehealth: Payer: Self-pay | Admitting: Cardiovascular Disease

## 2022-10-09 NOTE — Telephone Encounter (Signed)
   Pre-operative Risk Assessment    Patient Name: Joel [name]  DOB: 21-May-1980 MRN: [id number]      Request for Surgical Clearance    Procedure:   r earlobe keloid  Date of Surgery:  Clearance TBD                                 Surgeon:  Dr Shela Commons. Gertie Baron Surgeon's Group or Practice Name:  [facility] Department of Otolaryngology Phone number:  [phone] Fax number:  [phone]   Type of Clearance Requested:   - Medical    Type of Anesthesia:  General    Additional requests/questions:    SignedShawna Orleans   10/09/2022, 2:12 PM

## 2022-10-09 NOTE — Telephone Encounter (Signed)
   Name: Joel [name]  DOB: 1980/08/17  MRN: [id number]  Primary Cardiologist: Julien Nordmann, MD   Preoperative team, please contact this patient and set up a phone call appointment for further preoperative risk assessment. Please obtain consent and complete medication review. Thank you for your help.  I confirm that guidance regarding antiplatelet and oral anticoagulation therapy has been completed and, if necessary, noted below.  None requested   Carlos Levering, NP 10/09/2022, 4:15 PM [facility]

## 2022-10-10 ENCOUNTER — Telehealth: Payer: Self-pay

## 2022-10-10 NOTE — Telephone Encounter (Signed)
Spoke with patient who is agreeable to do tele visit on 9/5 at 2:20 pm. Med rec and consent have been done.

## 2022-10-10 NOTE — Telephone Encounter (Signed)
  Patient Consent for Virtual Visit        Joel [name] has provided verbal consent on 10/10/2022 for a virtual visit (video or telephone).   CONSENT FOR VIRTUAL VISIT FOR:  Joel [name]  By participating in this virtual visit I agree to the following:  I hereby voluntarily request, consent and authorize [facility] and its employed or contracted physicians, physician assistants, nurse practitioners or other licensed health care professionals (the Practitioner), to provide me with telemedicine health care services (the "Services") as deemed necessary by the treating Practitioner. I acknowledge and consent to receive the Services by the Practitioner via telemedicine. I understand that the telemedicine visit will involve communicating with the Practitioner through live audiovisual communication technology and the disclosure of certain medical information by electronic transmission. I acknowledge that I have been given the opportunity to request an in-person assessment or other available alternative prior to the telemedicine visit and am voluntarily participating in the telemedicine visit.  I understand that I have the right to withhold or withdraw my consent to the use of telemedicine in the course of my care at any time, without affecting my right to future care or treatment, and that the Practitioner or I may terminate the telemedicine visit at any time. I understand that I have the right to inspect all information obtained and/or recorded in the course of the telemedicine visit and may receive copies of available information for a reasonable fee.  I understand that some of the potential risks of receiving the Services via telemedicine include:  Delay or interruption in medical evaluation due to technological equipment failure or disruption; Information transmitted may not be sufficient (e.g. poor resolution of images) to allow for appropriate medical decision making by the Practitioner;  and/or  In rare instances, security protocols could fail, causing a breach of personal health information.  Furthermore, I acknowledge that it is my responsibility to provide information about my medical history, conditions and care that is complete and accurate to the best of my ability. I acknowledge that Practitioner's advice, recommendations, and/or decision may be based on factors not within their control, such as incomplete or inaccurate data provided by me or distortions of diagnostic images or specimens that may result from electronic transmissions. I understand that the practice of medicine is not an exact science and that Practitioner makes no warranties or guarantees regarding treatment outcomes. I acknowledge that a copy of this consent can be made available to me via my patient portal [facility] MyChart), or I can request a printed copy by calling the office of [facility].    I understand that my insurance will be billed for this visit.   I have read or had this consent read to me. I understand the contents of this consent, which adequately explains the benefits and risks of the Services being provided via telemedicine.  I have been provided ample opportunity to ask questions regarding this consent and the Services and have had my questions answered to my satisfaction. I give my informed consent for the services to be provided through the use of telemedicine in my medical care

## 2022-10-11 ENCOUNTER — Ambulatory Visit (INDEPENDENT_AMBULATORY_CARE_PROVIDER_SITE_OTHER): Payer: Managed Care, Other (non HMO)

## 2022-10-11 ENCOUNTER — Encounter: Payer: Self-pay | Admitting: Family Medicine

## 2022-10-11 DIAGNOSIS — Z0000 Encounter for general adult medical examination without abnormal findings: Secondary | ICD-10-CM | POA: Diagnosis not present

## 2022-10-11 NOTE — Patient Instructions (Addendum)
[name] , Thank you for taking time to come for your Medicare Wellness Visit. I appreciate your ongoing commitment to your health goals. Please review the following plan we discussed and let me know if I can assist you in the future.   Referrals/Orders/Follow-Ups/Clinician Recommendations: none  This is a list of the screening recommended for you and due dates:  Health Maintenance  Topic Date Due   Yearly kidney health urinalysis for diabetes  Never done   Hepatitis C Screening  Never done   Complete foot exam   12/20/2018   Eye exam for diabetics  06/09/2021   COVID-19 Vaccine (1 - 2023-24 season) Never done   Hemoglobin A1C  05/08/2022   Flu Shot  09/12/2022   Yearly kidney function blood test for diabetes  12/26/2022   Medicare Annual Wellness Visit  10/11/2023   DTaP/Tdap/Td vaccine (2 - Td or Tdap) 06/13/2024   HIV Screening  Completed   HPV Vaccine  Aged Out    Advanced directives: (ACP Link)Information on Advanced Care Planning can be found at [facility] Advance Health Care Directives Advance Health Care Directives ([email or web address]      Next Medicare Annual Wellness Visit scheduled for next year: Yes   10/17/23 @ 2:30 pm by phone

## 2022-10-11 NOTE — Progress Notes (Signed)
Subjective:   Joel [name] is a 42 y.o. male who presents for Medicare Annual/Subsequent preventive examination.  Visit Complete: Virtual  I connected with  [name] on 10/11/22 by a audio enabled telemedicine application and verified that I am speaking with the correct person using two identifiers.  Patient Location: Home  Provider Location: Office/Clinic  I discussed the limitations of evaluation and management by telemedicine. The patient expressed understanding and agreed to proceed.  Vital Signs: Unable to obtain new vitals due to this being a telehealth visit.   Review of Systems     Cardiac Risk Factors include: advanced age (>18men, >70 women);male gender;diabetes mellitus;hypertension;sedentary lifestyle;obesity (BMI >30kg/m2)     Objective:    There were no vitals filed for this visit. There is no height or weight on file to calculate BMI.     10/11/2022    3:34 PM 11/08/2021    4:04 PM 11/07/2021   11:00 PM 11/07/2021    3:42 PM 10/03/2020    2:44 PM 03/19/2019    9:16 PM 03/19/2019    3:15 PM  Advanced Directives  Does Patient Have a Medical Advance Directive? No  No No No  No  Would patient like information on creating a medical advance directive? No - Patient declined No - Patient declined No - Patient declined   No - Patient declined     Current Medications (verified) Outpatient Encounter Medications as of 10/11/2022  Medication Sig   Alirocumab (PRALUENT) 75 MG/ML SOAJ Inject 1 Pen into the skin every 14 (fourteen) days.   amLODipine (NORVASC) 10 MG tablet Take 1 tablet by mouth once daily   carvedilol (COREG) 25 MG tablet Take 1 tablet (25 mg total) by mouth 2 (two) times daily with a meal.   clopidogrel (PLAVIX) 75 MG tablet Take 1 tablet (75 mg total) by mouth daily.   ezetimibe (ZETIA) 10 MG tablet Take 1 tablet (10 mg total) by mouth daily.   OZEMPIC, 2 MG/DOSE, 8 MG/3ML SOPN Inject 2 mg into the skin once a week.   pantoprazole (PROTONIX) 40 MG  tablet TAKE 1 TABLET BY MOUTH ONCE DAILY BEFORE BREAKFAST - STOP OMEPRAZOLE   rosuvastatin (CRESTOR) 40 MG tablet Take 1 tablet (40 mg total) by mouth daily.   TRESIBA FLEXTOUCH 100 UNIT/ML FlexTouch Pen Inject 18 Units into the skin daily.   furosemide (LASIX) 20 MG tablet Take 1 tablet (20 mg total) by mouth daily as needed. For shortness of breath (Patient not taking: Reported on 10/11/2022)   gabapentin (NEURONTIN) 100 MG capsule Take 100 mg by mouth daily. (Patient not taking: Reported on 10/11/2022)   JARDIANCE 25 MG TABS tablet Take 25 mg by mouth daily. (Patient not taking: Reported on 10/11/2022)   montelukast (SINGULAIR) 10 MG tablet TAKE 1 TABLET BY MOUTH AT BEDTIME (Patient not taking: Reported on 10/11/2022)   tiZANidine (ZANAFLEX) 4 MG tablet Take 1 tablet (4 mg total) by mouth every 8 (eight) hours as needed for muscle spasms. (Patient not taking: Reported on 10/11/2022)   topiramate (TOPAMAX) 100 MG tablet Take 100 mg by mouth at bedtime. (Patient not taking: Reported on 10/11/2022)   No facility-administered encounter medications on file as of 10/11/2022.    Allergies (verified) Bidil [isosorb dinitrate-hydralazine] and Nitroglycerin   History: Past Medical History:  Diagnosis Date   Anxiety    Chest pain    a. 02/2017 MV: fixed mid ant/inferoapical defects consistent w/ attenuation. No ischemia.   Chronic diastolic CHF (congestive  heart failure) (HCC)    a. 06/2014 Echo: EF 45-50%; b. 02/2017 Echo: EF 25-30%, Gr2 DD; c. 01/2019 Echo: EF 55-60%; d. 03/2019 Echo: EF 55-60%, GrI DD, no rwma, sev LVH, nl RV fxn, mld TR, mild Ao sclerosis w/o stenosis.   CKD (chronic kidney disease) stage 3, GFR 30-59 ml/min (HCC)    Constipation    Depression    GERD (gastroesophageal reflux disease)    Hyperlipidemia    Hypertensive heart disease    Lactose intolerance    NICM (nonischemic cardiomyopathy) (HCC)    a. 06/2014 Echo: EF 45-50%; b. 02/2017 Echo: EF 25-30%, Gr2 DD; c. 02/2017 MV: EF 17%,  fixed mid ant/inferoapical defects consistent w/ attenuation. No ischemia; d. 01/2019 Echo: EF 55-60%; e. 03/2019 Echo: EF 55-60%, GrI DD.   Noncompliance with medications    Obesity    Palpitations    Sleep apnea    Stroke [facility])    Past Surgical History:  Procedure Laterality Date   NO PAST SURGERIES     XI ROBOTIC LAPAROSCOPIC ASSISTED APPENDECTOMY N/A 11/08/2021   Procedure: XI ROBOTIC LAPAROSCOPIC ASSISTED APPENDECTOMY;  Surgeon: Campbell Lerner, MD;  Location: [facility] ORS;  Service: General;  Laterality: N/A;   Family History  Problem Relation Age of Onset   Cancer Mother    Hypertension Mother    Alcoholism Father    Hyperlipidemia Father    Diabetes Father    CAD Father        a. stenting in his early 33s   Heart disease Father    Stroke Father    Heart attack Father    Diabetes Maternal Grandmother    Diabetes Maternal Grandfather    CAD Paternal Grandmother    CAD Paternal Grandfather    Colon cancer Paternal Grandfather    Diabetes Paternal Uncle    Social History   Socioeconomic History   Marital status: Married    Spouse name: Daiquon Quinlin   Number of children: 1   Years of education: 12   Highest education level: 12th grade  Occupational History   Occupation: Disabled  Tobacco Use   Smoking status: Never   Smokeless tobacco: Never  Vaping Use   Vaping status: Every Day   Substances: Nicotine, Flavoring  Substance and Sexual Activity   Alcohol use: Not Currently    Comment: Very infrequently; ~2-3 drinks/year    Drug use: No   Sexual activity: Not Currently    Birth control/protection: None  Other Topics Concern   Not on file  Social History Narrative   Not on file   Social Determinants of Health   Financial Resource Strain: Low Risk  (10/11/2022)   Overall Financial Resource Strain (CARDIA)    Difficulty of Paying Living Expenses: Not hard at all  Food Insecurity: No Food Insecurity (10/11/2022)   Hunger Vital Sign    Worried About Running Out  of Food in the Last Year: Never true    Ran Out of Food in the Last Year: Never true  Transportation Needs: No Transportation Needs (10/11/2022)   PRAPARE - Administrator, Civil Service (Medical): No    Lack of Transportation (Non-Medical): No  Physical Activity: Inactive (10/11/2022)   Exercise Vital Sign    Days of Exercise per Week: 0 days    Minutes of Exercise per Session: 0 min  Stress: No Stress Concern Present (10/11/2022)   [employer] of Occupational Health - Occupational Stress Questionnaire    Feeling of Stress : Only  a little  Social Connections: Moderately Integrated (10/11/2022)   Social Connection and Isolation Panel [NHANES]    Frequency of Communication with Friends and Family: More than three times a week    Frequency of Social Gatherings with Friends and Family: More than three times a week    Attends Religious Services: More than 4 times per year    Active Member of [employer] or Organizations: No    Attends Engineer, structural: Never    Marital Status: Married    Tobacco Counseling Counseling given: Not Answered   Clinical Intake:  Pre-visit preparation completed: Yes  Pain : No/denies pain     Nutritional Risks: None Diabetes: Yes CBG done?: No Did pt. bring in CBG monitor from home?: No  How often do you need to have someone help you when you read instructions, pamphlets, or other written materials from your doctor or pharmacy?: 1 - Never  Interpreter Needed?: No  Information entered by :: Kennedy Bucker, LPN   Activities of Daily Living    10/11/2022    3:34 PM 11/07/2021   11:00 PM  In your present state of health, do you have any difficulty performing the following activities:  Hearing? 0 0  Vision? 0 0  Difficulty concentrating or making decisions? 0 0  Walking or climbing stairs? 0 0  Dressing or bathing? 0 0  Doing errands, shopping? 0 0  Preparing Food and eating ? N   Using the Toilet? N   In the past six  months, have you accidently leaked urine? N   Do you have problems with loss of bowel control? N   Managing your Medications? N   Managing your Finances? N   Housekeeping or managing your Housekeeping? N     Patient Care Team: [name], DO as PCP - General (Family Medicine) Mariah Milling Tollie Pizza, MD as PCP - Cardiology (Cardiology) Tedd Sias Marlana Salvage, MD as Physician Assistant (Endocrinology)  Indicate any recent Medical Services you may have received from other than [facility] providers in the past year (date may be approximate).     Assessment:   This is a routine wellness examination for Joel [name].  Hearing/Vision screen Hearing Screening - Comments:: No aids Vision Screening - Comments:: No glasses  Dietary issues and exercise activities discussed:     Goals Addressed             This Visit's Progress    DIET - EAT MORE FRUITS AND VEGETABLES         Depression Screen    10/11/2022    3:32 PM 12/19/2021    3:58 PM 11/26/2021    2:53 PM 10/09/2021    3:08 PM 08/02/2021   10:34 AM 10/11/2020    9:08 AM 10/03/2020    2:45 PM  PHQ 2/9 Scores  PHQ - 2 Score 6 0 4 4 5 6 6   PHQ- 9 Score 8  18 20 17 19 20     Fall Risk    10/11/2022    3:34 PM 12/19/2021    3:57 PM 11/26/2021    2:53 PM 10/09/2021    3:15 PM 08/02/2021   10:34 AM  Fall Risk   Falls in the past year? 0 0 1 1 0  Number falls in past yr: 0 0 0 0 0  Injury with Fall? 0 0 1 1 0  Risk for fall due to : No Fall Risks No Fall Risks Impaired balance/gait Impaired balance/gait No Fall Risks  Follow  up Falls prevention discussed;Falls evaluation completed Falls evaluation completed Falls evaluation completed Falls evaluation completed Falls evaluation completed    MEDICARE RISK AT HOME: Medicare Risk at Home Any stairs in or around the home?: Yes If so, are there any without handrails?: No Home free of loose throw rugs in walkways, pet beds, electrical cords, etc?: Yes Adequate lighting in your home to reduce  risk of falls?: Yes Life alert?: No Use of a cane, walker or w/c?: No Grab bars in the bathroom?: No Shower chair or bench in shower?: Yes Elevated toilet seat or a handicapped toilet?: No  TIMED UP AND GO:  Was the test performed?  No    Cognitive Function:        10/11/2022    3:35 PM 10/09/2021    3:17 PM  6CIT Screen  What Year? 0 points 0 points  What month? 0 points 0 points  What time? 0 points 0 points  Count back from 20 0 points 0 points  Months in reverse 0 points 0 points  Repeat phrase 0 points 8 points  Total Score 0 points 8 points    Immunizations Immunization History  Administered Date(s) Administered   Influenza,inj,Quad PF,6+ Mos 02/14/2017   Pneumococcal Polysaccharide-23 06/14/2014, 02/14/2017   Tdap 06/14/2014    TDAP status: Up to date  Flu Vaccine status: Declined, Education has been provided regarding the importance of this vaccine but patient still declined. Advised may receive this vaccine at local pharmacy or Health Dept. Aware to provide a copy of the vaccination record if obtained from local pharmacy or Health Dept. Verbalized acceptance and understanding.  Pneumococcal vaccine status: Up to date  Covid-19 vaccine status: Declined, Education has been provided regarding the importance of this vaccine but patient still declined. Advised may receive this vaccine at local pharmacy or Health Dept.or vaccine clinic. Aware to provide a copy of the vaccination record if obtained from local pharmacy or Health Dept. Verbalized acceptance and understanding.  Qualifies for Shingles Vaccine? No   Zostavax completed Yes   Shingrix Completed?: No.    Education has been provided regarding the importance of this vaccine. Patient has been advised to call insurance company to determine out of pocket expense if they have not yet received this vaccine. Advised may also receive vaccine at local pharmacy or Health Dept. Verbalized acceptance and  understanding.  Screening Tests Health Maintenance  Topic Date Due   Diabetic kidney evaluation - Urine ACR  Never done   Hepatitis C Screening  Never done   FOOT EXAM  12/20/2018   OPHTHALMOLOGY EXAM  06/09/2021   COVID-19 Vaccine (1 - 2023-24 season) Never done   HEMOGLOBIN A1C  05/08/2022   INFLUENZA VACCINE  09/12/2022   Diabetic kidney evaluation - eGFR measurement  12/26/2022   Medicare Annual Wellness (AWV)  10/11/2023   DTaP/Tdap/Td (2 - Td or Tdap) 06/13/2024   HIV Screening  Completed   HPV VACCINES  Aged Out    Health Maintenance  Health Maintenance Due  Topic Date Due   Diabetic kidney evaluation - Urine ACR  Never done   Hepatitis C Screening  Never done   FOOT EXAM  12/20/2018   OPHTHALMOLOGY EXAM  06/09/2021   COVID-19 Vaccine (1 - 2023-24 season) Never done   HEMOGLOBIN A1C  05/08/2022   INFLUENZA VACCINE  09/12/2022    Lung Cancer Screening: (Low Dose CT Chest recommended if Age 75-80 years, 20 pack-year currently smoking OR have quit w/in 15years.) does not  qualify.   Additional Screening:  Hepatitis C Screening: does qualify; Completed no  Vision Screening: Recommended annual ophthalmology exams for early detection of glaucoma and other disorders of the eye. Is the patient up to date with their annual eye exam?  No  Who is the provider or what is the name of the office in which the patient attends annual eye exams? No one If pt is not established with a provider, would they like to be referred to a provider to establish care? No .   Dental Screening: Recommended annual dental exams for proper oral hygiene  Diabetic Foot Exam: Diabetic Foot Exam: Overdue, Pt has been advised about the importance in completing this exam. Pt is scheduled for diabetic foot exam on 00.  Community Resource Referral / Chronic Care Management: CRR required this visit?  No   CCM required this visit?  No     Plan:     I have personally reviewed and noted the following  in the patient's chart:   Medical and social history Use of alcohol, tobacco or illicit drugs  Current medications and supplements including opioid prescriptions. Patient is not currently taking opioid prescriptions. Functional ability and status Nutritional status Physical activity Advanced directives List of other physicians Hospitalizations, surgeries, and ER visits in previous 12 months Vitals Screenings to include cognitive, depression, and falls Referrals and appointments  In addition, I have reviewed and discussed with patient certain preventive protocols, quality metrics, and best practice recommendations. A written personalized care plan for preventive services as well as general preventive health recommendations were provided to patient.     Hal Hope, LPN   9/60/4540   After Visit Summary: (MyChart) Due to this being a telephonic visit, the after visit summary with patients personalized plan was offered to patient via MyChart   Nurse Notes: none

## 2022-10-16 ENCOUNTER — Other Ambulatory Visit: Payer: Self-pay | Admitting: Cardiovascular Disease

## 2022-10-16 NOTE — Progress Notes (Unsigned)
Virtual Visit via Telephone Note   Because of Joel [name]'s co-morbid illnesses, he is at least at moderate risk for complications without adequate follow up.  This format is felt to be most appropriate for this patient at this time.  The patient did not have access to video technology/had technical difficulties with video requiring transitioning to audio format only (telephone).  All issues noted in this document were discussed and addressed.  No physical exam could be performed with this format.  Please refer to the patient's chart for his consent to telehealth for [facility].  Evaluation Performed:  Preoperative cardiovascular risk assessment _____________   Date:  10/17/2022   Patient ID:  Joel [name], DOB [birth year], MRN [id number] Patient Location:  Home Provider location:   Office  Primary Care Provider:  [name], DO Primary Cardiologist:  Julien Nordmann, MD  Chief Complaint / Patient Profile   42 y.o. y/o male with a h/o nonischemic cardiomyopathy secondary to hypertensive heart disease, chronic heart failure with improved ejection fraction, stage III-IV chronic kidney disease, type 2 diabetes mellitus, hyperlipidemia, obesity, sleep apnea, and stroke.  He is pending right earlobe keloid removal by Dr. Gertie Baron, [facility] Department of Otolaryngology on 9/26, and presents today for telephonic preoperative cardiovascular risk assessment. Per patient, he is no longer taking Jardiance. He is on Ozempic and says that he has received guidance from prescribing provider regarding when to stop prior to surgery.  History of Present Illness    Joel [name] is a 42 y.o. male who presents via audio/video conferencing for a telehealth visit today.  Pt was last seen in cardiology clinic on 06/19/2022 by Ward Givens, NP.  At that time DEWEY CORDREY was doing well .  The patient is now pending procedure as outlined above. Since his last visit, he has lost 41 pounds and  overall feels much better as a result of this. He still gets regular physical activity but does not go to the gym as regularly as he used to. When he is active, no symptoms.  Today patient denies chest pain, shortness of breath, lower extremity edema, fatigue, palpitations, melena, hematuria, hemoptysis, diaphoresis, weakness, presyncope, syncope, orthopnea, and PND.   Past Medical History    Past Medical History:  Diagnosis Date   Anxiety    Chest pain    a. 02/2017 MV: fixed mid ant/inferoapical defects consistent w/ attenuation. No ischemia.   Chronic diastolic CHF (congestive heart failure) (HCC)    a. 06/2014 Echo: EF 45-50%; b. 02/2017 Echo: EF 25-30%, Gr2 DD; c. 01/2019 Echo: EF 55-60%; d. 03/2019 Echo: EF 55-60%, GrI DD, no rwma, sev LVH, nl RV fxn, mld TR, mild Ao sclerosis w/o stenosis.   CKD (chronic kidney disease) stage 3, GFR 30-59 ml/min (HCC)    Constipation    Depression    GERD (gastroesophageal reflux disease)    Hyperlipidemia    Hypertensive heart disease    Lactose intolerance    NICM (nonischemic cardiomyopathy) (HCC)    a. 06/2014 Echo: EF 45-50%; b. 02/2017 Echo: EF 25-30%, Gr2 DD; c. 02/2017 MV: EF 17%, fixed mid ant/inferoapical defects consistent w/ attenuation. No ischemia; d. 01/2019 Echo: EF 55-60%; e. 03/2019 Echo: EF 55-60%, GrI DD.   Noncompliance with medications    Obesity    Palpitations    Sleep apnea    Stroke [facility])    Past Surgical History:  Procedure Laterality Date   NO PAST SURGERIES  XI ROBOTIC LAPAROSCOPIC ASSISTED APPENDECTOMY N/A 11/08/2021   Procedure: XI ROBOTIC LAPAROSCOPIC ASSISTED APPENDECTOMY;  Surgeon: Campbell Lerner, MD;  Location: [facility] ORS;  Service: General;  Laterality: N/A;    Allergies  Allergies  Allergen Reactions   Bidil [Isosorb Dinitrate-Hydralazine] Other (See Comments)    Severe headache   Nitroglycerin Other (See Comments)    Causes severe headaches    Home Medications    Prior to Admission medications    Medication Sig Start Date End Date Taking? Authorizing Provider  Alirocumab (PRALUENT) 75 MG/ML SOAJ Inject 1 Pen into the skin every 14 (fourteen) days. 09/20/21   Antonieta Iba, MD  amLODipine (NORVASC) 10 MG tablet Take 1 tablet by mouth once daily 05/13/22   Antonieta Iba, MD  carvedilol (COREG) 25 MG tablet Take 1 tablet (25 mg total) by mouth 2 (two) times daily with a meal. 12/17/21   Gollan, Tollie Pizza, MD  clopidogrel (PLAVIX) 75 MG tablet Take 1 tablet (75 mg total) by mouth daily. 12/18/21   Antonieta Iba, MD  ezetimibe (ZETIA) 10 MG tablet Take 1 tablet (10 mg total) by mouth daily. 09/20/21   Antonieta Iba, MD  furosemide (LASIX) 20 MG tablet Take 1 tablet (20 mg total) by mouth daily as needed. For shortness of breath Patient not taking: Reported on 10/11/2022 01/15/21   Antonieta Iba, MD  gabapentin (NEURONTIN) 100 MG capsule Take 100 mg by mouth daily. Patient not taking: Reported on 10/11/2022 04/08/22   [provider]  JARDIANCE 25 MG TABS tablet Take 25 mg by mouth daily. Patient not taking: Reported on 10/11/2022 11/05/21   [provider]  montelukast (SINGULAIR) 10 MG tablet TAKE 1 TABLET BY MOUTH AT BEDTIME Patient not taking: Reported on 10/11/2022 10/17/21   [name], DO  OZEMPIC, 2 MG/DOSE, 8 MG/3ML SOPN Inject 2 mg into the skin once a week. 10/17/21   [provider]  pantoprazole (PROTONIX) 40 MG tablet TAKE 1 TABLET BY MOUTH ONCE DAILY BEFORE BREAKFAST - STOP OMEPRAZOLE 07/30/22   Karamalegos, Netta Neat, DO  rosuvastatin (CRESTOR) 40 MG tablet Take 1 tablet (40 mg total) by mouth daily. 12/19/21   Antonieta Iba, MD  tiZANidine (ZANAFLEX) 4 MG tablet Take 1 tablet (4 mg total) by mouth every 8 (eight) hours as needed for muscle spasms. Patient not taking: Reported on 10/11/2022 04/09/22   [name], MD  topiramate (TOPAMAX) 100 MG tablet Take 100 mg by mouth at bedtime. Patient not taking: Reported on 10/11/2022  07/04/21   [provider]  TRESIBA FLEXTOUCH 100 UNIT/ML FlexTouch Pen Inject 18 Units into the skin daily. 08/09/21   [provider]    Physical Exam    Vital Signs:  JOEB MADOLE does not have vital signs available for review today.  Given telephonic nature of communication, physical exam is limited. AAOx3. NAD. Normal affect.  Speech and respirations are unlabored.  Accessory Clinical Findings    None  Assessment & Plan    1.  Preoperative Cardiovascular Risk Assessment:  The patient was advised that if he develops new symptoms prior to surgery to contact our office to arrange for a follow-up visit, and he verbalized understanding.  According to the Revised Cardiac Risk Index (RCRI), his Perioperative Risk of Major Cardiac Event is (%): 11  His Functional Capacity in METs is: 8.97 according to the [facility] Activity Status Index (DASI).   Patient states today that his surgeon has not requested  that he stop Plavix pre-procedure.   Therefore, based on ACC/AHA guidelines, patient would be at acceptable risk for the planned procedure without further cardiovascular testing. We recommend pre-operative kidney function testing given history of CKD. I will route this recommendation to the requesting party via Epic fax function.   A copy of this note will be routed to requesting surgeon.  Time:   Today, I have spent 20 minutes with the patient with telehealth technology discussing medical history, symptoms, and management plan.     Perlie Gold, PA-C  10/17/2022, 2:55 PM

## 2022-10-17 ENCOUNTER — Ambulatory Visit: Payer: Medicare Other | Attending: Internal Medicine

## 2022-10-17 DIAGNOSIS — Z01810 Encounter for preprocedural cardiovascular examination: Secondary | ICD-10-CM

## 2022-10-17 DIAGNOSIS — Z01818 Encounter for other preprocedural examination: Secondary | ICD-10-CM

## 2022-10-24 ENCOUNTER — Other Ambulatory Visit: Payer: Self-pay | Admitting: Cardiovascular Disease

## 2022-10-25 ENCOUNTER — Other Ambulatory Visit (HOSPITAL_COMMUNITY): Payer: Self-pay

## 2022-10-25 NOTE — Telephone Encounter (Signed)
PA needed

## 2022-10-25 NOTE — Telephone Encounter (Signed)
Pharmacy Patient Advocate Encounter   Received notification from RX Request Messages that prior authorization for repatha is required/requested.   Insurance verification completed.   The patient is insured through [employer] .  65.00 Per test claim: The current 10/25/22 day co-pay is, $65.00.  No PA needed at this time. This test claim was processed through [facility]- copay amounts may vary at other pharmacies due to pharmacy/plan contracts, or as the patient moves through the different stages of their insurance plan.

## 2022-10-28 ENCOUNTER — Other Ambulatory Visit: Payer: Self-pay | Admitting: Family Medicine

## 2022-10-28 DIAGNOSIS — K219 Gastro-esophageal reflux disease without esophagitis: Secondary | ICD-10-CM

## 2022-10-29 ENCOUNTER — Other Ambulatory Visit (HOSPITAL_COMMUNITY): Payer: Self-pay

## 2022-10-29 ENCOUNTER — Encounter: Payer: Self-pay | Admitting: Cardiovascular Disease

## 2022-10-29 ENCOUNTER — Telehealth: Payer: Self-pay | Admitting: Pharmacy Technician

## 2022-10-29 NOTE — Telephone Encounter (Signed)
Pharmacy Patient Advocate Encounter   Received notification from Patient Advice Request messages that prior authorization for praluent is required/requested.   Insurance verification completed.   The patient is insured through [employer] .   Per test claim: PA required; PA submitted to [employer] via Fax Key/confirmation #/EOC faxed Status is pending

## 2022-10-30 ENCOUNTER — Other Ambulatory Visit (HOSPITAL_COMMUNITY): Payer: Self-pay

## 2022-10-30 NOTE — Telephone Encounter (Signed)
Pharmacy Patient Advocate Encounter  Received notification from [employer] that Prior Authorization for PRALUENT has been APPROVED from 09/29/22 to 10/30/23. Ran test claim, Copay is $90. This test claim was processed through [facility]- copay amounts may vary at other pharmacies due to pharmacy/plan contracts, or as the patient moves through the different stages of their insurance plan.

## 2022-10-31 NOTE — Telephone Encounter (Signed)
Patient notified via My Chart message.

## 2022-11-14 ENCOUNTER — Encounter: Payer: Self-pay | Admitting: Family Medicine

## 2022-11-14 ENCOUNTER — Ambulatory Visit (INDEPENDENT_AMBULATORY_CARE_PROVIDER_SITE_OTHER): Payer: Managed Care, Other (non HMO) | Admitting: Family Medicine

## 2022-11-14 VITALS — BP 118/88 | HR 92 | Wt 246.0 lb

## 2022-11-14 DIAGNOSIS — N529 Male erectile dysfunction, unspecified: Secondary | ICD-10-CM | POA: Diagnosis not present

## 2022-11-14 DIAGNOSIS — F411 Generalized anxiety disorder: Secondary | ICD-10-CM

## 2022-11-14 DIAGNOSIS — F4310 Post-traumatic stress disorder, unspecified: Secondary | ICD-10-CM

## 2022-11-14 DIAGNOSIS — Z23 Encounter for immunization: Secondary | ICD-10-CM

## 2022-11-14 DIAGNOSIS — F331 Major depressive disorder, recurrent, moderate: Secondary | ICD-10-CM

## 2022-11-14 MED ORDER — SILDENAFIL CITRATE 20 MG PO TABS: ORAL_TABLET | ORAL | 3 refills | Status: AC

## 2022-11-14 NOTE — Progress Notes (Signed)
Subjective:    Patient ID: Joel [name], male    DOB: 1980/12/18, 42 y.o.   MRN: [id number]  Joel [name] is a 42 y.o. male presenting on 11/14/2022 for Erectile Dysfunction   HPI  Discussed the use of AI scribe software for clinical note transcription with the patient, who gave verbal consent to proceed.     The patient has been managing his overall health well, with controlled blood pressure, cholesterol, and blood sugar levels. Despite these efforts, the erectile dysfunction has persisted, leading to the current consultation for further management options.     Erectile Dysfunction Chronic problem gradual worsening over past 8 years He describes able to get a partial erection but cannot maintain erection. He does admit issue with sensitivity issue when previously >1 year ago on medication No significant spontaneous erections frequently but can temporarily occur. He is able to orgasm still and have ejaculation. He uses a silicone ring, limited results on maintaining circulation with this. Tried the OTC Miracle Honey some temporary relief. Admits sexual dysfunction impacting his mental health He has history of PTSD, voice of feeling inadequate daily  Currently following with mental health therapist. He has 2nd therapist apt later  The patient has been managing his overall health well, with controlled blood pressure, cholesterol, and blood sugar levels. Despite these efforts, the erectile dysfunction has persisted, leading to the current consultation for further management options.       11/14/2022    8:48 AM 10/11/2022    3:32 PM 12/19/2021    3:58 PM  Depression screen PHQ 2/9  Decreased Interest 2 3 0  Down, Depressed, Hopeless 3 3 0  PHQ - 2 Score 5 6 0  Altered sleeping 2 1   Tired, decreased energy 2 1   Change in appetite 3    Feeling bad or failure about yourself  3    Trouble concentrating 3    Moving slowly or fidgety/restless 0    Suicidal thoughts 3    PHQ-9  Score 21 8   Difficult doing work/chores Extremely dIfficult Somewhat difficult    [place]-Suicide Severity Rating Scale 1) Have you wished you were dead or wished you could go to sleep and not wake up? - Yes  2) Have you had any actual thoughts of killing yourself? - No  Skip questions 3,4, 5  6) Have you ever done anything, started to do anything, or prepared to do anything to end your life? - No   Social History   Tobacco Use   Smoking status: Never   Smokeless tobacco: Never  Vaping Use   Vaping status: Every Day   Substances: Nicotine, Flavoring  Substance Use Topics   Alcohol use: Not Currently    Comment: Very infrequently; ~2-3 drinks/year    Drug use: No    Review of Systems Per HPI unless specifically indicated above     Objective:    BP 118/88 (BP Location: Left Arm, Cuff Size: Normal)   Pulse 92   Wt 246 lb (111.6 kg)   SpO2 99%   BMI 38.53 kg/m   Wt Readings from Last 3 Encounters:  11/14/22 246 lb (111.6 kg)  09/17/22 258 lb (117 kg)  06/19/22 286 lb 6.4 oz (129.9 kg)    Physical Exam Vitals and nursing note reviewed.  Constitutional:      General: He is not in acute distress.    Appearance: Normal appearance. He is well-developed. He is not diaphoretic.  Comments: Well-appearing, comfortable, cooperative  HENT:     Head: Normocephalic and atraumatic.  Eyes:     General:        Right eye: No discharge.        Left eye: No discharge.     Conjunctiva/sclera: Conjunctivae normal.  Cardiovascular:     Rate and Rhythm: Normal rate.  Pulmonary:     Effort: Pulmonary effort is normal.  Skin:    General: Skin is warm and dry.     Findings: No erythema or rash.  Neurological:     Mental Status: He is alert and oriented to person, place, and time.  Psychiatric:        Mood and Affect: Mood normal.        Behavior: Behavior normal.        Thought Content: Thought content normal.     Comments: Well groomed, good eye contact, normal speech  and thoughts      Results for orders placed or performed in visit on 12/25/21  BASIC METABOLIC PANEL WITH GFR  Result Value Ref Range   Glucose, Bld 96 65 - 99 mg/dL   BUN 21 7 - 25 mg/dL   Creat 1.61 (H) 0.96 - 1.29 mg/dL   eGFR 56 (L) > OR = 60 mL/min/1.82m2   BUN/Creatinine Ratio 13 6 - 22 (calc)   Sodium 142 135 - 146 mmol/L   Potassium 3.7 3.5 - 5.3 mmol/L   Chloride 105 98 - 110 mmol/L   CO2 27 20 - 32 mmol/L   Calcium 9.2 8.6 - 10.3 mg/dL      Assessment & Plan:   Problem List Items Addressed This Visit     Depression, major, recurrent, moderate (HCC)   Other Visit Diagnoses     Erectile dysfunction, unspecified erectile dysfunction type    -  Primary   Relevant Medications   sildenafil (REVATIO) 20 MG tablet   GAD (generalized anxiety disorder)       PTSD (post-traumatic stress disorder)           Assessment and Plan    Erectile Dysfunction Gradual worsening over years. Partial erection achieved but not maintained. Occasional spontaneous erections. No significant pain or numbness. Previous use of silicone ring and honey product with limited success. No current medications contributing to erectile dysfunction. -Start Sildenafil (generic Viagra) 20mg , 1-2 tablets as needed 30 minutes prior to sexual activity. -Consider increasing dose if needed, up to 100mg . -Consider switch to generic Tadalafil Cialis for longer effect window if Sildenafil is not satisfactory. -Refer to urologist if no improvement after titration of medications.  Mental Health Patient reports ongoing struggle with PTSD, feelings of inadequacy, and irritability. Currently seeing a therapist. -Continue therapy sessions. -Discuss potential need for medication with therapist, keeping in mind potential impact on erectile function.  Hypertension Slightly elevated bottom number on blood pressure reading. Patient is currently on medication and has lost significant weight. -Continue current  medication regimen. -Monitor blood pressure regularly.  General Health Maintenance -Declined flu shot.        Meds ordered this encounter  Medications   sildenafil (REVATIO) 20 MG tablet    Sig: Take 1-5 pills about 30 min prior to sex. Start with 1-2 and increase as needed.    Dispense:  90 tablet    Refill:  3      Follow up plan: Return if symptoms worsen or fail to improve.   Saralyn Pilar, DO [facility]  Group 11/14/2022, 8:59 AM

## 2022-11-14 NOTE — Patient Instructions (Addendum)
Thank you for coming to the office today.  Start Sildenafil 20mg  - take 1 tablet about 30 min prior to sexual intercourse for improved erection. If this dose does not work or is not strong enough NEXT TIME you can increase to 2 pills for 40mg . Maximum dose is 5 pills or 100mg , most people end up taking 3-4 pills per dose and this decision is up to you based on the results.  Once you take a dose, you have to wait 24 hours to repeat a dose.  You will need to use www.goodrx.com website or app on phone to enter "Sildenafil" 20mg  medication and "Get Free Coupon" option to save for [place] pharmacy once you select pharmacy and # of pills. Show that coupon to pharmacy. Otherwise it will cost hundreds of dollars.  Follow up if not working or new concerns we can refer you to a Urologist.   Please schedule a Follow-up Appointment to: Return if symptoms worsen or fail to improve.  If you have any other questions or concerns, please feel free to call the office or send a message through MyChart. You may also schedule an earlier appointment if necessary.  Additionally, you may be receiving a survey about your experience at our office within a few days to 1 week by e-mail or mail. We value your feedback.  Saralyn Pilar, DO [facility], [place]

## 2022-12-17 ENCOUNTER — Other Ambulatory Visit: Payer: Self-pay | Admitting: Cardiovascular Disease

## 2022-12-18 ENCOUNTER — Ambulatory Visit: Payer: Managed Care, Other (non HMO)

## 2022-12-23 ENCOUNTER — Ambulatory Visit: Payer: Managed Care, Other (non HMO) | Admitting: Nurse Practitioner

## 2022-12-24 ENCOUNTER — Ambulatory Visit (INDEPENDENT_AMBULATORY_CARE_PROVIDER_SITE_OTHER): Payer: Managed Care, Other (non HMO) | Admitting: Internal Medicine

## 2022-12-24 ENCOUNTER — Other Ambulatory Visit: Payer: Self-pay | Admitting: Cardiovascular Disease

## 2022-12-24 ENCOUNTER — Encounter: Payer: Self-pay | Admitting: Internal Medicine

## 2022-12-24 VITALS — BP 140/100 | HR 93 | Temp 98.2°F | Resp 16 | Ht 67.0 in | Wt 236.2 lb

## 2022-12-24 DIAGNOSIS — I5022 Chronic systolic (congestive) heart failure: Secondary | ICD-10-CM

## 2022-12-24 DIAGNOSIS — G4733 Obstructive sleep apnea (adult) (pediatric): Secondary | ICD-10-CM

## 2022-12-24 DIAGNOSIS — Z7189 Other specified counseling: Secondary | ICD-10-CM | POA: Diagnosis not present

## 2022-12-24 NOTE — Patient Instructions (Signed)

## 2022-12-24 NOTE — Progress Notes (Signed)
[facility] [address] [place], [place] [address]  Pulmonary Sleep Medicine   Office Visit Note  Patient Name: Joel [name] DOB: 11-15-1980 MRN [id number]  Date of Service: 12/24/2022  Complaints/HPI: Doing well with his CPAP at the current pressures. Patient states that he feels good when he is using it. His AHI is 0.5 per hour on the device and 100% compliance  Office Spirometry Results:     ROS  General: (-) fever, (-) chills, (-) night sweats, (-) weakness Skin: (-) rashes, (-) itching,. Eyes: (-) visual changes, (-) redness, (-) itching. Nose and Sinuses: (-) nasal stuffiness or itchiness, (-) postnasal drip, (-) nosebleeds, (-) sinus trouble. Mouth and Throat: (-) sore throat, (-) hoarseness. Neck: (-) swollen glands, (-) enlarged thyroid, (-) neck pain. Respiratory: - cough, (-) bloody sputum, - shortness of breath, - wheezing. Cardiovascular: - ankle swelling, (-) chest pain. Lymphatic: (-) lymph node enlargement. Neurologic: (-) numbness, (-) tingling. Psychiatric: (-) anxiety, (-) depression   Current Medication: Outpatient Encounter Medications as of 12/24/2022  Medication Sig   Alirocumab (PRALUENT) 75 MG/ML SOAJ INJECT 1 PEN INTO THE SKIN EVERY 14 DAYS   amLODipine (NORVASC) 10 MG tablet Take 1 tablet by mouth once daily   carvedilol (COREG) 25 MG tablet TAKE 1 TABLET BY MOUTH TWICE DAILY WITH A MEAL   clopidogrel (PLAVIX) 75 MG tablet Take 1 tablet by mouth once daily   ezetimibe (ZETIA) 10 MG tablet Take 1 tablet by mouth once daily   OZEMPIC, 2 MG/DOSE, 8 MG/3ML SOPN Inject 2 mg into the skin once a week.   pantoprazole (PROTONIX) 40 MG tablet TAKE 1 TABLET BY MOUTH ONCE DAILY BEFORE BREAKFAST. STOP OMEPRAZOLE.   rosuvastatin (CRESTOR) 40 MG tablet Take 1 tablet (40 mg total) by mouth daily.   sildenafil (REVATIO) 20 MG tablet Take 1-5 pills about 30 min prior to sex. Start with 1-2 and increase as needed.   TRESIBA FLEXTOUCH 100 UNIT/ML  FlexTouch Pen Inject 18 Units into the skin daily.   No facility-administered encounter medications on file as of 12/24/2022.    Surgical History: Past Surgical History:  Procedure Laterality Date   NO PAST SURGERIES     XI ROBOTIC LAPAROSCOPIC ASSISTED APPENDECTOMY N/A 11/08/2021   Procedure: XI ROBOTIC LAPAROSCOPIC ASSISTED APPENDECTOMY;  Surgeon: Campbell Lerner, MD;  Location: [facility] ORS;  Service: General;  Laterality: N/A;    Medical History: Past Medical History:  Diagnosis Date   Anxiety    Chest pain    a. 02/2017 MV: fixed mid ant/inferoapical defects consistent w/ attenuation. No ischemia.   Chronic diastolic CHF (congestive heart failure) (HCC)    a. 06/2014 Echo: EF 45-50%; b. 02/2017 Echo: EF 25-30%, Gr2 DD; c. 01/2019 Echo: EF 55-60%; d. 03/2019 Echo: EF 55-60%, GrI DD, no rwma, sev LVH, nl RV fxn, mld TR, mild Ao sclerosis w/o stenosis.   CKD (chronic kidney disease) stage 3, GFR 30-59 ml/min (HCC)    Constipation    Depression    GERD (gastroesophageal reflux disease)    Hyperlipidemia    Hypertensive heart disease    Lactose intolerance    NICM (nonischemic cardiomyopathy) (HCC)    a. 06/2014 Echo: EF 45-50%; b. 02/2017 Echo: EF 25-30%, Gr2 DD; c. 02/2017 MV: EF 17%, fixed mid ant/inferoapical defects consistent w/ attenuation. No ischemia; d. 01/2019 Echo: EF 55-60%; e. 03/2019 Echo: EF 55-60%, GrI DD.   Noncompliance with medications    Obesity    Palpitations    Sleep  apnea    Stroke [facility])     Family History: Family History  Problem Relation Age of Onset   Cancer Mother    Hypertension Mother    Alcoholism Father    Hyperlipidemia Father    Diabetes Father    CAD Father        a. stenting in his early 54s   Heart disease Father    Stroke Father    Heart attack Father    Diabetes Maternal Grandmother    Diabetes Maternal Grandfather    CAD Paternal Grandmother    CAD Paternal Grandfather    Colon cancer Paternal Grandfather    Diabetes Paternal Uncle      Social History: Social History   Socioeconomic History   Marital status: Married    Spouse name: Avion Pavis   Number of children: 1   Years of education: 12   Highest education level: 12th grade  Occupational History   Occupation: Disabled  Tobacco Use   Smoking status: Never   Smokeless tobacco: Never  Vaping Use   Vaping status: Every Day   Substances: Nicotine, Flavoring  Substance and Sexual Activity   Alcohol use: Not Currently    Comment: Very infrequently; ~2-3 drinks/year    Drug use: No   Sexual activity: Not Currently    Birth control/protection: None  Other Topics Concern   Not on file  Social History Narrative   Not on file   Social Determinants of Health   Financial Resource Strain: Low Risk  (10/11/2022)   Overall Financial Resource Strain (CARDIA)    Difficulty of Paying Living Expenses: Not hard at all  Food Insecurity: No Food Insecurity (10/11/2022)   Hunger Vital Sign    Worried About Running Out of Food in the Last Year: Never true    Ran Out of Food in the Last Year: Never true  Transportation Needs: No Transportation Needs (10/11/2022)   PRAPARE - Administrator, Civil Service (Medical): No    Lack of Transportation (Non-Medical): No  Physical Activity: Inactive (10/11/2022)   Exercise Vital Sign    Days of Exercise per Week: 0 days    Minutes of Exercise per Session: 0 min  Stress: No Stress Concern Present (10/11/2022)   [employer] of Occupational Health - Occupational Stress Questionnaire    Feeling of Stress : Only a little  Social Connections: Moderately Integrated (10/11/2022)   Social Connection and Isolation Panel [NHANES]    Frequency of Communication with Friends and Family: More than three times a week    Frequency of Social Gatherings with Friends and Family: More than three times a week    Attends Religious Services: More than 4 times per year    Active Member of [employer] or Organizations: No    Attends Tax inspector Meetings: Never    Marital Status: Married  Catering manager Violence: Not At Risk (10/11/2022)   Humiliation, Afraid, Rape, and Kick questionnaire    Fear of Current or Ex-Partner: No    Emotionally Abused: No    Physically Abused: No    Sexually Abused: No    Vital Signs: Blood pressure (!) 140/100, pulse 93, temperature 98.2 F (36.8 C), resp. rate 16, height 5\' 7"  (1.702 m), weight 236 lb 3.2 oz (107.1 kg), SpO2 99%.  Examination: General Appearance: The patient is well-developed, well-nourished, and in no distress. Skin: Gross inspection of skin unremarkable. Head: normocephalic, no gross deformities. Eyes: no gross deformities noted. ENT: ears  appear grossly normal no exudates. Neck: Supple. No thyromegaly. No LAD. Respiratory: no rhonchi noted. Cardiovascular: Normal S1 and S2 without murmur or rub. Extremities: No cyanosis. pulses are equal. Neurologic: Alert and oriented. No involuntary movements.  LABS: No results found for this or any previous visit (from the past 2160 hour(s)).  Radiology: DG Shoulder Left  Result Date: 04/10/2022 CLINICAL DATA:  Impingement. Evaluate acromion. Degenerative changes. Superior left shoulder pain. EXAM: LEFT SHOULDER - 2+ VIEW COMPARISON:  None Available. FINDINGS: Normal bone mineralization. The glenohumeral joint space is maintained. There appears to be inferior downsloping of the anterior acromion on transscapular Y-view. The acromioclavicular joint otherwise appears normally aligned without significant osteoarthritis. No acute fracture or dislocation. The visualized portion of the left lung is unremarkable. IMPRESSION: There appears to be inferior downsloping of the anterior acromion on transscapular Y-view. This can be associated with impingement. Electronically Signed   By: Neita Garnet M.D.   On: 04/10/2022 12:46   DG Cervical Spine Complete  Result Date: 04/10/2022 CLINICAL DATA:  Superior left shoulder pain for 6  months. Left radiculopathy. EXAM: CERVICAL SPINE - COMPLETE 4+ VIEW COMPARISON:  None Available. FINDINGS: Normal frontal and sagittal alignment. Vertebral body heights are maintained. Disc spaces are preserved. On oblique views there appears to be right C4-5 and C6-7 and left C3-4 and C4-5 neuroforaminal stenosis. This appears greatest at the left C3-4 level. The atlantodens interval is intact. No prevertebral soft tissue swelling. Mild calcifications overlie the bilateral carotid bulbs. The lung apices are clear. IMPRESSION: Left-greater-than-right neuroforaminal narrowing as above. Electronically Signed   By: Neita Garnet M.D.   On: 04/10/2022 12:45    No results found.  No results found.  Assessment and Plan: Patient Active Problem List   Diagnosis Date Noted   Impingement of left shoulder 04/09/2022   Cervical radiculitis 04/09/2022   History of stroke 11/07/2021   Sleep apnea    Hypokalemia    Morbid obesity with BMI of 40.0-44.9, adult (HCC) 04/27/2019   Orthostatic hypotension 04/14/2019   Depression, major, recurrent, moderate (HCC) 12/19/2017   Fatigue 04/07/2017   Dilated cardiomyopathy (HCC) 02/23/2017   Hypertensive heart disease with heart failure (HCC) 02/23/2017   Essential hypertension 02/21/2017   Type 2 diabetes mellitus with diabetic nephropathy (HCC) 02/21/2017   H/O medication noncompliance 02/12/2017   Stage 3a chronic kidney disease (HCC) 02/12/2017   Systolic CHF, chronic (HCC) 10/11/2014    1. OSA (obstructive sleep apnea) Doing well will continue with the current pressures his mean pressure is 9.5cwp and excellent compliance  2. Obesity, morbid (HCC) Obesity Counseling: Had a lengthy discussion regarding patients BMI and weight issues. Patient was instructed on portion control as well as increased activity. Also discussed caloric restrictions with trying to maintain intake less than 2000 Kcal. Discussions were made in accordance with the 5As of weight  management. Simple actions such as not eating late and if able to, taking a walk is suggested.   3. CPAP use counseling CPAP Counseling: had a lengthy discussion with the patient regarding the importance of PAP therapy in management of the sleep apnea. Patient appears to understand the risk factor reduction and also understands the risks associated with untreated sleep apnea.   4. Chronic systolic heart failure (HCC) Last EF was improved at 55-65% on the echo   General Counseling: I have discussed the findings of the evaluation and examination with Arlys John.  I have also discussed any further diagnostic evaluation thatmay be needed or ordered today.  Azahel verbalizes understanding of the findings of todays visit. We also reviewed his medications today and discussed drug interactions and side effects including but not limited excessive drowsiness and altered mental states. We also discussed that there is always a risk not just to him but also people around him. he has been encouraged to call the office with any questions or concerns that should arise related to todays visit.  No orders of the defined types were placed in this encounter.    Time spent: 28  I have personally obtained a history, examined the patient, evaluated laboratory and imaging results, formulated the assessment and plan and placed orders.    Yevonne Pax, MD [facility] Pulmonary and Critical Care Sleep medicine

## 2022-12-27 ENCOUNTER — Other Ambulatory Visit: Payer: Self-pay | Admitting: Cardiovascular Disease

## 2023-01-18 ENCOUNTER — Encounter: Payer: Self-pay | Admitting: Cardiovascular Disease

## 2023-01-20 ENCOUNTER — Other Ambulatory Visit: Payer: Self-pay

## 2023-01-20 MED ORDER — PRALUENT 75 MG/ML ~~LOC~~ SOAJ: 75.0000 mg | SUBCUTANEOUS | 3 refills | Status: DC

## 2023-01-22 ENCOUNTER — Other Ambulatory Visit: Payer: Self-pay | Admitting: Family Medicine

## 2023-01-22 DIAGNOSIS — K219 Gastro-esophageal reflux disease without esophagitis: Secondary | ICD-10-CM

## 2023-01-22 NOTE — Telephone Encounter (Signed)
Requested Prescriptions  Pending Prescriptions Disp Refills   pantoprazole (PROTONIX) 40 MG tablet [Pharmacy Med Name: Pantoprazole Sodium 40 MG Oral Tablet Delayed Release] 90 tablet 0    Sig: TAKE 1 TABLET BY MOUTH ONCE DAILY BEFORE BREAKFAST. STOP OMEPRAZOLE.     Gastroenterology: Proton Pump Inhibitors Passed - 01/22/2023  6:51 AM      Passed - Valid encounter within last 12 months    Recent Outpatient Visits           2 months ago Erectile dysfunction, unspecified erectile dysfunction type   [place] [facility] [name], DO   4 months ago Impingement of left shoulder   [place] [facility], Ocie Bob, MD   7 months ago Morbid obesity with BMI of 40.0-44.9, adult [facility])   [place] [facility] [name], DO   7 months ago Impingement of left shoulder   [facility] at [facility], Ocie Bob, MD   9 months ago Impingement of left shoulder   [facility] at [facility], Ocie Bob, MD

## 2023-01-30 ENCOUNTER — Telehealth: Payer: Self-pay | Admitting: Cardiovascular Disease

## 2023-01-30 NOTE — Telephone Encounter (Signed)
   Pre-operative Risk Assessment    Patient Name: Joel [name]  DOB: 05-24-1980 MRN: [id number]      Request for Surgical Clearance    Procedure:  Septoplasty, turbinate reduction  Date of Surgery:  Clearance 02/04/23                                 Surgeon:  Dr. Larene Beach Group or Practice Name:  [facility] Phone number:  [phone] Fax number:  [phone]   Type of Clearance Requested:  Pharmacy hold Plavix 2days pre surgery and restart 5days post    Type of Anesthesia:  General    Additional requests/questions:    Signed, Narda Amber   01/30/2023, 8:42 AM

## 2023-01-30 NOTE — Telephone Encounter (Signed)
   Patient Name: Joel [name]  DOB: 10/27/1980 MRN: [id number]  Primary Cardiologist: Julien Nordmann, MD  Chart reviewed as part of pre-operative protocol coverage.   Patient has history of acute CVA. Recommendations for holding Plavix will need to come from PCP/neurology.    Napoleon Form, Leodis Rains, NP 01/30/2023, 8:51 AM

## 2023-02-14 ENCOUNTER — Telehealth (INDEPENDENT_AMBULATORY_CARE_PROVIDER_SITE_OTHER): Payer: Managed Care, Other (non HMO) | Admitting: Family Medicine

## 2023-02-14 DIAGNOSIS — R600 Localized edema: Secondary | ICD-10-CM | POA: Diagnosis not present

## 2023-02-14 DIAGNOSIS — E1121 Type 2 diabetes mellitus with diabetic nephropathy: Secondary | ICD-10-CM

## 2023-02-14 DIAGNOSIS — Z6841 Body Mass Index (BMI) 40.0 and over, adult: Secondary | ICD-10-CM

## 2023-02-14 DIAGNOSIS — I5022 Chronic systolic (congestive) heart failure: Secondary | ICD-10-CM | POA: Diagnosis not present

## 2023-02-14 DIAGNOSIS — Z7985 Long-term (current) use of injectable non-insulin antidiabetic drugs: Secondary | ICD-10-CM

## 2023-02-14 MED ORDER — FUROSEMIDE 20 MG PO TABS: 20.0000 mg | ORAL_TABLET | Freq: Every day | ORAL | 2 refills | Status: DC | PRN

## 2023-02-14 NOTE — Progress Notes (Signed)
 Subjective:    Patient ID: Joel [name], male    DOB: 1980/05/02, 43 y.o.   MRN: [id number]  Joel [name] is a 43 y.o. male presenting on 02/14/2023 for Edema   Virtual / Telehealth Encounter - Video Visit via MyChart The purpose of this virtual visit is to provide medical care while limiting exposure to the novel coronavirus (COVID19) for both patient and office staff.  Consent was obtained for remote visit:  Yes.   Answered questions that patient had about telehealth interaction:  Yes.   I discussed the limitations, risks, security and privacy concerns of performing an evaluation and management service by video/telephone. I also discussed with the patient that there may be a patient responsible charge related to this service. The patient expressed understanding and agreed to proceed.  Patient Location: Home Provider Location: [facility] (Office)  Participants in virtual visit: - Patient: Joel [name] - CMA: Alan Fontana CMA - Provider: Dr Edman   HPI  Discussed the use of AI scribe software for clinical note transcription with the patient, who gave verbal consent to proceed.  History of Present Illness    The patient, with a history of congestive heart failure, recently experienced a rapid weight gain and peripheral edema, which he attributes to dietary indiscretions. Over the course of two days, he experienced increased urination and subsequently lost the excess fluid weight. The patient has not been on a diuretic since his initial diagnosis of heart failure but is considering restarting it for symptom management.  In addition to the fluid retention, the patient recently underwent nasal surgery. Postoperatively, he was advised to pause his Plavix , a long-term medication. He is now inquiring about the need to resume this medication.  The patient also reports struggles with weight management, with frequent fluctuations in weight. He was previously on  Ozempic, which he believes may have contributed to his increased urination and subsequent weight loss. He has not been taking this medication for the past week due to recent surgery         11/14/2022    8:48 AM 10/11/2022    3:32 PM 12/19/2021    3:58 PM  Depression screen PHQ 2/9  Decreased Interest 2 3 0  Down, Depressed, Hopeless 3 3 0  PHQ - 2 Score 5 6 0  Altered sleeping 2 1   Tired, decreased energy 2 1   Change in appetite 3    Feeling bad or failure about yourself  3    Trouble concentrating 3    Moving slowly or fidgety/restless 0    Suicidal thoughts 3    PHQ-9 Score 21 8   Difficult doing work/chores Extremely dIfficult Somewhat difficult        11/14/2022    8:50 AM 11/26/2021    2:54 PM 08/02/2021   10:34 AM 10/11/2020    9:08 AM  GAD 7 : Generalized Anxiety Score  Nervous, Anxious, on Edge 2 3 1 1   Control/stop worrying 3 3 2 3   Worry too much - different things 3 3 2 3   Trouble relaxing 3 3 2 2   Restless 2 2 2 1   Easily annoyed or irritable 3 3 3 2   Afraid - awful might happen 3 3 3 3   Total GAD 7 Score 19 20 15 15   Anxiety Difficulty  Somewhat difficult Somewhat difficult Extremely difficult    Social History   Tobacco Use   Smoking status: Never   Smokeless tobacco: Never  Vaping Use   Vaping status: Every Day   Substances: Nicotine, Flavoring  Substance Use Topics   Alcohol use: Not Currently    Comment: Very infrequently; ~2-3 drinks/year    Drug use: No    Review of Systems Per HPI unless specifically indicated above     Objective:    There were no vitals taken for this visit.  Wt Readings from Last 3 Encounters:  12/24/22 236 lb 3.2 oz (107.1 kg)  11/14/22 246 lb (111.6 kg)  09/17/22 258 lb (117 kg)     Physical Exam  Note examination was completely remotely via video observation objective data only  Gen - well-appearing, no acute distress or apparent pain, comfortable HEENT - eyes appear clear without discharge or  redness Heart/Lungs - cannot examine virtually - observed no evidence of coughing or labored breathing. Abd - cannot examine virtually  Skin - face visible today- no rash Neuro - awake, alert, oriented Psych - not anxious appearing   Results for orders placed or performed in visit on 12/25/21  BASIC METABOLIC PANEL WITH GFR   Collection Time: 12/25/21 11:41 AM  Result Value Ref Range   Glucose, Bld 96 65 - 99 mg/dL   BUN 21 7 - 25 mg/dL   Creat 8.40 (H) 9.39 - 1.29 mg/dL   eGFR 56 (L) > OR = 60 mL/min/1.63m2   BUN/Creatinine Ratio 13 6 - 22 (calc)   Sodium 142 135 - 146 mmol/L   Potassium 3.7 3.5 - 5.3 mmol/L   Chloride 105 98 - 110 mmol/L   CO2 27 20 - 32 mmol/L   Calcium  9.2 8.6 - 10.3 mg/dL      Assessment & Plan:   Problem List Items Addressed This Visit     Morbid obesity with BMI of 40.0-44.9, adult (HCC) - Primary   Systolic CHF, chronic (HCC)   Relevant Medications   furosemide  (LASIX ) 20 MG tablet   Type 2 diabetes mellitus with diabetic nephropathy (HCC)   Other Visit Diagnoses       Bilateral lower extremity edema       Relevant Medications   furosemide  (LASIX ) 20 MG tablet         Congestive Heart Failure, Systolic Followed by HF Clinic Recent episode of fluid retention with rapid weight gain and ankle swelling, resolved with increased urination. No current diuretic use. -Restart Furosemide  (Lasix ) 20mg  PRN for episodes of fluid retention. -Advise patient to contact healthcare provider if diuretic use becomes a daily necessity.  Post Nasal Surgery Patient recently had nasal surgery and is recovering well. -Resume Plavix  as it was paused for the procedure.  Weight Management Patient reports difficulty with weight management and recent rapid weight gain due to fluid retention. -Encourage continued efforts in weight management.  Type 2 Dm / Obesity Resume Ozempic 2mg  weekly, since it was held for surgery        No orders of the defined types  were placed in this encounter.   Meds ordered this encounter  Medications   furosemide  (LASIX ) 20 MG tablet    Sig: Take 1 tablet (20 mg total) by mouth daily as needed for fluid or edema.    Dispense:  30 tablet    Refill:  2    Follow up plan: Return if symptoms worsen or fail to improve.   Patient verbalizes understanding with the above medical recommendations including the limitation of remote medical advice.  Specific follow-up and call-back criteria were given for patient to follow-up  or seek medical care more urgently if needed.  Total duration of direct patient care provided via video conference: 6 minutes   Marsa Officer, DO [facility] 02/15/2023, 4:38 PM

## 2023-02-15 ENCOUNTER — Encounter: Payer: Self-pay | Admitting: Family Medicine

## 2023-02-15 NOTE — Patient Instructions (Signed)
   Please schedule a Follow-up Appointment to: No follow-ups on file.  If you have any other questions or concerns, please feel free to call the office or send a message through MyChart. You may also schedule an earlier appointment if necessary.  Additionally, you may be receiving a survey about your experience at our office within a few days to 1 week by e-mail or mail. We value your feedback.  Saralyn Pilar, DO [facility], [place]

## 2023-02-23 ENCOUNTER — Other Ambulatory Visit: Payer: Self-pay | Admitting: Family Medicine

## 2023-02-23 ENCOUNTER — Encounter: Payer: Self-pay | Admitting: Cardiovascular Disease

## 2023-02-23 DIAGNOSIS — I110 Hypertensive heart disease with heart failure: Secondary | ICD-10-CM

## 2023-02-23 DIAGNOSIS — K219 Gastro-esophageal reflux disease without esophagitis: Secondary | ICD-10-CM

## 2023-02-24 ENCOUNTER — Other Ambulatory Visit: Payer: Self-pay

## 2023-02-24 DIAGNOSIS — I110 Hypertensive heart disease with heart failure: Secondary | ICD-10-CM

## 2023-02-24 MED ORDER — EZETIMIBE 10 MG PO TABS: 10.0000 mg | ORAL_TABLET | Freq: Every day | ORAL | 3 refills | Status: DC

## 2023-02-24 MED ORDER — AMLODIPINE BESYLATE 10 MG PO TABS: 10.0000 mg | ORAL_TABLET | Freq: Every day | ORAL | 1 refills | Status: DC

## 2023-02-24 MED ORDER — AMLODIPINE BESYLATE 10 MG PO TABS: 10.0000 mg | ORAL_TABLET | Freq: Every day | ORAL | 0 refills | Status: DC

## 2023-02-24 MED ORDER — PANTOPRAZOLE SODIUM 40 MG PO TBEC: 40.0000 mg | DELAYED_RELEASE_TABLET | Freq: Every day | ORAL | 0 refills | Status: DC

## 2023-02-24 MED ORDER — CARVEDILOL 25 MG PO TABS: 25.0000 mg | ORAL_TABLET | Freq: Two times a day (BID) | ORAL | 3 refills | Status: DC

## 2023-03-07 ENCOUNTER — Ambulatory Visit (INDEPENDENT_AMBULATORY_CARE_PROVIDER_SITE_OTHER): Payer: Managed Care, Other (non HMO) | Admitting: Family Medicine

## 2023-03-07 ENCOUNTER — Encounter: Payer: Self-pay | Admitting: Family Medicine

## 2023-03-07 VITALS — BP 132/84 | HR 98 | Ht 67.0 in | Wt 252.0 lb

## 2023-03-07 DIAGNOSIS — G5692 Unspecified mononeuropathy of left upper limb: Secondary | ICD-10-CM | POA: Diagnosis not present

## 2023-03-07 MED ORDER — GABAPENTIN 100 MG PO CAPS: 100.0000 mg | ORAL_CAPSULE | Freq: Every day | ORAL | 0 refills | Status: DC

## 2023-03-07 MED ORDER — TIZANIDINE HCL 4 MG PO TABS: 4.0000 mg | ORAL_TABLET | Freq: Four times a day (QID) | ORAL | 0 refills | Status: DC | PRN

## 2023-03-07 NOTE — Progress Notes (Signed)
Subjective:    Patient ID: Joel [name], male    DOB: May 06, 1980, 43 y.o.   MRN: [id number]  Joel [name] is a 43 y.o. male presenting on 03/07/2023 for Spasms (Nerve impingement)   HPI  Discussed the use of AI scribe software for clinical note transcription with the patient, who gave verbal consent to proceed.  History of Present Illness     The patient, with a history of shoulder issues, presents with a new onset of sharp, cramping pain that radiates from the left shoulder to the chest across to R side. The pain, described as a 'muscle spasming' sensation, typically occurs in the early morning hours (around 4-5 AM) upon waking from sleep. The pain is provoked by movement, specifically when rising from a left-sided sleeping position to use the bathroom. The discomfort persists for approximately two hours after onset.  The patient reports no associated shortness of breath or chest tightness. The pain started about a week ago and has been consistent since then. The patient recently resumed gym activities but denies any upper body exercises that could have triggered the pain.  The patient has previously been on a variety of medications over past several years, including gabapentin and muscle relaxants like baclofen and Flexeril, for shoulder and neck issues. The patient also had a shoulder injection in the past. The patient's left shoulder is the problematic one, but the current pain is described as different from the previous shoulder pain.  The patient has tried changing sleep positions but finds it challenging as the left side is the most comfortable.        11/14/2022    8:48 AM 10/11/2022    3:32 PM 12/19/2021    3:58 PM  Depression screen PHQ 2/9  Decreased Interest 2 3 0  Down, Depressed, Hopeless 3 3 0  PHQ - 2 Score 5 6 0  Altered sleeping 2 1   Tired, decreased energy 2 1   Change in appetite 3    Feeling bad or failure about yourself  3    Trouble concentrating 3     Moving slowly or fidgety/restless 0    Suicidal thoughts 3    PHQ-9 Score 21 8   Difficult doing work/chores Extremely dIfficult Somewhat difficult        11/14/2022    8:50 AM 11/26/2021    2:54 PM 08/02/2021   10:34 AM 10/11/2020    9:08 AM  GAD 7 : Generalized Anxiety Score  Nervous, Anxious, on Edge 2 3 1 1   Control/stop worrying 3 3 2 3   Worry too much - different things 3 3 2 3   Trouble relaxing 3 3 2 2   Restless 2 2 2 1   Easily annoyed or irritable 3 3 3 2   Afraid - awful might happen 3 3 3 3   Total GAD 7 Score 19 20 15 15   Anxiety Difficulty  Somewhat difficult Somewhat difficult Extremely difficult    Social History   Tobacco Use   Smoking status: Never   Smokeless tobacco: Never  Vaping Use   Vaping status: Every Day   Substances: Nicotine, Flavoring  Substance Use Topics   Alcohol use: Not Currently    Comment: Very infrequently; ~2-3 drinks/year    Drug use: No    Review of Systems Per HPI unless specifically indicated above     Objective:    BP 132/84   Pulse 98   Ht 5\' 7"  (1.702 m)   Wt 252 lb (  114.3 kg)   SpO2 99%   BMI 39.47 kg/m   Wt Readings from Last 3 Encounters:  03/07/23 252 lb (114.3 kg)  12/24/22 236 lb 3.2 oz (107.1 kg)  11/14/22 246 lb (111.6 kg)    Physical Exam Vitals and nursing note reviewed.  Constitutional:      General: He is not in acute distress.    Appearance: He is well-developed. He is not diaphoretic.     Comments: Well-appearing, comfortable, cooperative  HENT:     Head: Normocephalic and atraumatic.  Eyes:     General:        Right eye: No discharge.        Left eye: No discharge.     Conjunctiva/sclera: Conjunctivae normal.  Neck:     Thyroid: No thyromegaly.  Cardiovascular:     Rate and Rhythm: Normal rate and regular rhythm.     Pulses: Normal pulses.     Heart sounds: Normal heart sounds. No murmur heard. Pulmonary:     Effort: Pulmonary effort is normal. No respiratory distress.     Breath  sounds: Normal breath sounds. No wheezing or rales.  Musculoskeletal:        General: Normal range of motion.     Cervical back: Normal range of motion and neck supple.  Lymphadenopathy:     Cervical: No cervical adenopathy.  Skin:    General: Skin is warm and dry.     Findings: No erythema or rash.  Neurological:     Mental Status: He is alert and oriented to person, place, and time. Mental status is at baseline.  Psychiatric:        Behavior: Behavior normal.     Comments: Well groomed, good eye contact, normal speech and thoughts     Results for orders placed or performed in visit on 12/25/21  BASIC METABOLIC PANEL WITH GFR   Collection Time: 12/25/21 11:41 AM  Result Value Ref Range   Glucose, Bld 96 65 - 99 mg/dL   BUN 21 7 - 25 mg/dL   Creat 1.61 (H) 0.96 - 1.29 mg/dL   eGFR 56 (L) > OR = 60 mL/min/1.70m2   BUN/Creatinine Ratio 13 6 - 22 (calc)   Sodium 142 135 - 146 mmol/L   Potassium 3.7 3.5 - 5.3 mmol/L   Chloride 105 98 - 110 mmol/L   CO2 27 20 - 32 mmol/L   Calcium 9.2 8.6 - 10.3 mg/dL      Assessment & Plan:   Problem List Items Addressed This Visit   None Visit Diagnoses       Nerve entrapment syndrome of upper extremity, left    -  Primary   Relevant Medications   tiZANidine (ZANAFLEX) 4 MG tablet   gabapentin (NEURONTIN) 100 MG capsule        Left Upper Extremity / Chest Wall Pain / Spasms Sharp, positional pain radiating from the left side to the chest, likely nerve-related. Suspect nerve entrapment or impingement. Seems only provoked with certain position sleeping on L side and movement.  No typical cardiac or anginal symptoms. No associated shortness of breath or tightness.  Recent return to gym activities, but no specific upper body exercises or injury could explain the pain  Reassurance today seems MSK / neuropathic  -Start Gabapentin at bedtime for nerve pain. -Start Tizanidine as needed for muscle spasm and pain, can be taken before bed  and upon waking if pain is present. Caution advised due to potential for sedation.  -  Consider physical therapy modalities such as massage, chiropractic treatment, or dry needling if pain persists and can be localized to a specific nerve or muscle.  -Check in for reassessment if symptoms do not improve or worsen.         No orders of the defined types were placed in this encounter.   Meds ordered this encounter  Medications   tiZANidine (ZANAFLEX) 4 MG tablet    Sig: Take 1 tablet (4 mg total) by mouth every 6 (six) hours as needed for muscle spasms.    Dispense:  30 tablet    Refill:  0   gabapentin (NEURONTIN) 100 MG capsule    Sig: Take 1 capsule (100 mg total) by mouth at bedtime. May take 1-2 additional doses as needed every 6-8 hours. Max dose is 3 capsules in 24 hours.    Dispense:  90 capsule    Refill:  0    Follow up plan: Return if symptoms worsen or fail to improve.   Saralyn Pilar, DO [facility] 03/07/2023, 4:14 PM

## 2023-03-07 NOTE — Patient Instructions (Addendum)
Thank you for coming to the office today.  Unlikely to be heart or lung or other more serious condition.  Likely nerve entrapment pinching / compression, especially with body position with sleeping / L shoulder  Try these meds again to reduce nerve and muscle symptoms, hopefully it will eventually heal and resolve.  Gabapentin Take 1 capsule (100 mg total) by mouth at bedtime. May take 1-2 additional doses as needed every 6-8 hours. Max dose is 3 capsules in 24 hours.   Tizanidine as needed for muscle spasm and pain every 6-8 hours, can take before bed and then again when wake up if need.   Please schedule a Follow-up Appointment to: Return if symptoms worsen or fail to improve.  If you have any other questions or concerns, please feel free to call the office or send a message through MyChart. You may also schedule an earlier appointment if necessary.  Additionally, you may be receiving a survey about your experience at our office within a few days to 1 week by e-mail or mail. We value your feedback.  Saralyn Pilar, DO [facility], [place]

## 2023-04-14 ENCOUNTER — Other Ambulatory Visit: Payer: Self-pay | Admitting: Cardiovascular Disease

## 2023-04-14 DIAGNOSIS — I110 Hypertensive heart disease with heart failure: Secondary | ICD-10-CM

## 2023-04-30 ENCOUNTER — Other Ambulatory Visit: Payer: Self-pay

## 2023-04-30 ENCOUNTER — Other Ambulatory Visit: Payer: Self-pay | Admitting: Family Medicine

## 2023-04-30 DIAGNOSIS — E1121 Type 2 diabetes mellitus with diabetic nephropathy: Secondary | ICD-10-CM

## 2023-04-30 MED ORDER — TRESIBA FLEXTOUCH 100 UNIT/ML ~~LOC~~ SOPN
18.0000 [IU] | PEN_INJECTOR | Freq: Every day | SUBCUTANEOUS | 1 refills | Status: AC
  Filled 2023-04-30: qty 15, 83d supply, fill #0

## 2023-06-22 ENCOUNTER — Other Ambulatory Visit: Payer: Self-pay | Admitting: Family Medicine

## 2023-06-22 DIAGNOSIS — G5692 Unspecified mononeuropathy of left upper limb: Secondary | ICD-10-CM

## 2023-06-23 MED ORDER — TIZANIDINE HCL 4 MG PO TABS: 4.0000 mg | ORAL_TABLET | Freq: Four times a day (QID) | ORAL | 2 refills | Status: DC | PRN

## 2023-06-23 MED ORDER — GABAPENTIN 100 MG PO CAPS
100.0000 mg | ORAL_CAPSULE | Freq: Every day | ORAL | 2 refills | Status: AC
Start: 2023-06-23 — End: ?

## 2023-06-29 ENCOUNTER — Other Ambulatory Visit: Payer: Self-pay | Admitting: Family Medicine

## 2023-06-29 DIAGNOSIS — K219 Gastro-esophageal reflux disease without esophagitis: Secondary | ICD-10-CM

## 2023-07-02 NOTE — Telephone Encounter (Signed)
 Last OV within protocol. Requested Prescriptions  Pending Prescriptions Disp Refills   pantoprazole  (PROTONIX ) 40 MG tablet [Pharmacy Med Name: Pantoprazole  Sodium 40 MG Oral Tablet Delayed Release] 90 tablet 0    Sig: TAKE 1 TABLET BY MOUTH ONCE DAILY BEFORE BREAKFAST. STOP OMEPRAZOLE     Gastroenterology: Proton Pump Inhibitors Failed - 07/02/2023  8:28 AM      Failed - Valid encounter within last 12 months    Recent Outpatient Visits   None

## 2023-07-20 ENCOUNTER — Other Ambulatory Visit: Payer: Self-pay | Admitting: Family Medicine

## 2023-07-20 DIAGNOSIS — R600 Localized edema: Secondary | ICD-10-CM

## 2023-07-20 DIAGNOSIS — I5022 Chronic systolic (congestive) heart failure: Secondary | ICD-10-CM

## 2023-07-21 NOTE — Telephone Encounter (Signed)
 Requested medications are due for refill today.  yes  Requested medications are on the active medications list.  yes  Last refill. 02/14/2023 #30 2 rf  Future visit scheduled.   no  Notes to clinic.  Labs are expired.    Requested Prescriptions  Pending Prescriptions Disp Refills   furosemide  (LASIX ) 20 MG tablet [Pharmacy Med Name: Furosemide  20 MG Oral Tablet] 30 tablet 0    Sig: TAKE 1 TABLET BY MOUTH ONCE DAILY AS NEEDED FOR  FLUID  OR  EDEMA     Cardiovascular:  Diuretics - Loop Failed - 07/21/2023  5:59 PM      Failed - K in normal range and within 180 days    Potassium  Date Value Ref Range Status  12/25/2021 3.7 3.5 - 5.3 mmol/L Final  06/11/2014 3.4 (L) mmol/L Final    Comment:    3.5-5.1 NOTE: New Reference Range  04/19/14          Failed - Ca in normal range and within 180 days    Calcium   Date Value Ref Range Status  12/25/2021 9.2 8.6 - 10.3 mg/dL Final   Calcium , Total  Date Value Ref Range Status  06/11/2014 9.0 mg/dL Final    Comment:    0.9-81.1 NOTE: New Reference Range  04/19/14          Failed - Na in normal range and within 180 days    Sodium  Date Value Ref Range Status  12/25/2021 142 135 - 146 mmol/L Final  07/25/2020 141 134 - 144 mmol/L Final  06/11/2014 137 mmol/L Final    Comment:    135-145 NOTE: New Reference Range  04/19/14          Failed - Cr in normal range and within 180 days    Creat  Date Value Ref Range Status  12/25/2021 1.59 (H) 0.60 - 1.29 mg/dL Final         Failed - Cl in normal range and within 180 days    Chloride  Date Value Ref Range Status  12/25/2021 105 98 - 110 mmol/L Final  06/11/2014 104 mmol/L Final    Comment:    101-111 NOTE: New Reference Range  04/19/14          Failed - Mg Level in normal range and within 180 days    Magnesium   Date Value Ref Range Status  11/09/2021 1.7 1.7 - 2.4 mg/dL Final    Comment:    Performed at [facility], [address] [place], [place]  [address]         Failed - Valid encounter within last 6 months    Recent Outpatient Visits   None            Passed - Last BP in normal range    BP Readings from Last 1 Encounters:  03/07/23 132/84

## 2023-07-27 ENCOUNTER — Encounter: Payer: Self-pay | Admitting: Family Medicine

## 2023-08-12 ENCOUNTER — Other Ambulatory Visit: Payer: Self-pay | Admitting: Cardiovascular Disease

## 2023-08-27 ENCOUNTER — Other Ambulatory Visit: Payer: Self-pay | Admitting: Cardiovascular Disease

## 2023-08-27 DIAGNOSIS — I110 Hypertensive heart disease with heart failure: Secondary | ICD-10-CM

## 2023-08-31 ENCOUNTER — Other Ambulatory Visit: Payer: Self-pay | Admitting: Cardiovascular Disease

## 2023-09-14 ENCOUNTER — Other Ambulatory Visit: Payer: Self-pay | Admitting: Cardiovascular Disease

## 2023-09-14 ENCOUNTER — Other Ambulatory Visit: Payer: Self-pay | Admitting: Family Medicine

## 2023-09-14 DIAGNOSIS — I5022 Chronic systolic (congestive) heart failure: Secondary | ICD-10-CM

## 2023-09-14 DIAGNOSIS — R600 Localized edema: Secondary | ICD-10-CM

## 2023-09-16 NOTE — Telephone Encounter (Signed)
 Requested medication (s) are due for refill today: yes  Requested medication (s) are on the active medication list: yes  Last refill:  07/22/23  Future visit scheduled: Yes  Notes to clinic:  Unable to refill per protocol due to failed labs, no updated results.      Requested Prescriptions  Pending Prescriptions Disp Refills   furosemide  (LASIX ) 20 MG tablet [Pharmacy Med Name: Furosemide  20 MG Oral Tablet] 30 tablet 0    Sig: TAKE 1 TABLET BY MOUTH ONCE DAILY AS NEEDED FOR  FLUID  OR  EDEMA     Cardiovascular:  Diuretics - Loop Failed - 09/16/2023 10:24 AM      Failed - K in normal range and within 180 days    Potassium  Date Value Ref Range Status  12/25/2021 3.7 3.5 - 5.3 mmol/L Final  06/11/2014 3.4 (L) mmol/L Final    Comment:    3.5-5.1 NOTE: New Reference Range  04/19/14          Failed - Ca in normal range and within 180 days    Calcium   Date Value Ref Range Status  12/25/2021 9.2 8.6 - 10.3 mg/dL Final   Calcium , Total  Date Value Ref Range Status  06/11/2014 9.0 mg/dL Final    Comment:    1.0-89.6 NOTE: New Reference Range  04/19/14          Failed - Na in normal range and within 180 days    Sodium  Date Value Ref Range Status  12/25/2021 142 135 - 146 mmol/L Final  07/25/2020 141 134 - 144 mmol/L Final  06/11/2014 137 mmol/L Final    Comment:    135-145 NOTE: New Reference Range  04/19/14          Failed - Cr in normal range and within 180 days    Creat  Date Value Ref Range Status  12/25/2021 1.59 (H) 0.60 - 1.29 mg/dL Final         Failed - Cl in normal range and within 180 days    Chloride  Date Value Ref Range Status  12/25/2021 105 98 - 110 mmol/L Final  06/11/2014 104 mmol/L Final    Comment:    101-111 NOTE: New Reference Range  04/19/14          Failed - Mg Level in normal range and within 180 days    Magnesium   Date Value Ref Range Status  11/09/2021 1.7 1.7 - 2.4 mg/dL Final    Comment:    Performed at [facility], [address] [place], [place] [address]         Failed - Valid encounter within last 6 months    Recent Outpatient Visits   None            Passed - Last BP in normal range    BP Readings from Last 1 Encounters:  03/07/23 132/84

## 2023-09-21 ENCOUNTER — Other Ambulatory Visit: Payer: Self-pay | Admitting: Family Medicine

## 2023-09-21 ENCOUNTER — Other Ambulatory Visit: Payer: Self-pay | Admitting: Cardiovascular Disease

## 2023-09-21 DIAGNOSIS — R600 Localized edema: Secondary | ICD-10-CM

## 2023-09-21 DIAGNOSIS — I5022 Chronic systolic (congestive) heart failure: Secondary | ICD-10-CM

## 2023-09-24 NOTE — Telephone Encounter (Signed)
 Duplicate request, LRF 09/16/23 for 30 days.  Requested Prescriptions  Pending Prescriptions Disp Refills   furosemide (LASIX) 20 MG tablet [Pharmacy Med Name: Furosemide 20 MG Oral Tablet] 30 tablet 0    Sig: TAKE 1 TABLET BY MOUTH ONCE DAILY AS NEEDED FOR  FLUID  OR  EDEMA     Cardiovascular:  Diuretics - Loop Failed - 09/24/2023  2:24 PM      Failed - K in normal range and within 180 days    Potassium  Date Value Ref Range Status  12/25/2021 3.7 3.5 - 5.3 mmol/L Final  06/11/2014 3.4 (L) mmol/L Final    Comment:    3.5-5.1 NOTE: New Reference Range  04/19/14          Failed - Ca in normal range and within 180 days    Calcium  Date Value Ref Range Status  12/25/2021 9.2 8.6 - 10.3 mg/dL Final   Calcium, Total  Date Value Ref Range Status  06/11/2014 9.0 mg/dL Final    Comment:    1.0-89.6 NOTE: New Reference Range  04/19/14          Failed - Na in normal range and within 180 days    Sodium  Date Value Ref Range Status  12/25/2021 142 135 - 146 mmol/L Final  07/25/2020 141 134 - 144 mmol/L Final  06/11/2014 137 mmol/L Final    Comment:    135-145 NOTE: New Reference Range  04/19/14          Failed - Cr in normal range and within 180 days    Creat  Date Value Ref Range Status  12/25/2021 1.59 (H) 0.60 - 1.29 mg/dL Final         Failed - Cl in normal range and within 180 days    Chloride  Date Value Ref Range Status  12/25/2021 105 98 - 110 mmol/L Final  06/11/2014 104 mmol/L Final    Comment:    101-111 NOTE: New Reference Range  04/19/14          Failed - Mg Level in normal range and within 180 days    Magnesium  Date Value Ref Range Status  11/09/2021 1.7 1.7 - 2.4 mg/dL Final    Comment:    Performed at [facility], [address] [place], [place] [address]         Failed - Valid encounter within last 6 months    Recent Outpatient Visits   None            Passed - Last BP in normal range    BP Readings from Last 1  Encounters:  03/07/23 132/84

## 2023-09-28 ENCOUNTER — Other Ambulatory Visit: Payer: Self-pay | Admitting: Family Medicine

## 2023-09-28 ENCOUNTER — Encounter: Payer: Self-pay | Admitting: Cardiovascular Disease

## 2023-09-28 ENCOUNTER — Other Ambulatory Visit: Payer: Self-pay | Admitting: Cardiovascular Disease

## 2023-09-28 DIAGNOSIS — K219 Gastro-esophageal reflux disease without esophagitis: Secondary | ICD-10-CM

## 2023-09-28 DIAGNOSIS — G5692 Unspecified mononeuropathy of left upper limb: Secondary | ICD-10-CM

## 2023-09-29 ENCOUNTER — Other Ambulatory Visit: Payer: Self-pay | Admitting: *Deleted

## 2023-09-29 MED ORDER — ROSUVASTATIN CALCIUM 40 MG PO TABS: 40.0000 mg | ORAL_TABLET | Freq: Every day | ORAL | 0 refills | Status: DC

## 2023-09-30 NOTE — Telephone Encounter (Signed)
 Requested medications are due for refill today.  yes  Requested medications are on the active medications list.  yes  Last refill. 06/23/2023 #30 2 rf  Future visit scheduled.   yes  Notes to clinic.  Refill not delegated.    Requested Prescriptions  Pending Prescriptions Disp Refills   tiZANidine  (ZANAFLEX ) 4 MG tablet [Pharmacy Med Name: tiZANidine  HCl 4 MG Oral Tablet] 30 tablet 0    Sig: TAKE 1 TABLET BY MOUTH EVERY 6 HOURS AS NEEDED FOR MUSCLE SPASM     Not Delegated - Cardiovascular:  Alpha-2 Agonists - tizanidine  Failed - 09/30/2023  5:12 PM      Failed - This refill cannot be delegated      Failed - Valid encounter within last 6 months    Recent Outpatient Visits   None     Future Appointments             In 3 days Vivienne Lonni Ingle, NP [facility]            Signed Prescriptions Disp Refills   pantoprazole  (PROTONIX ) 40 MG tablet 90 tablet 0    Sig: TAKE 1 TABLET BY MOUTH ONCE DAILY BEFORE BREAKFAST. STOP OMEPRAZOLE     Gastroenterology: Proton Pump Inhibitors Failed - 09/30/2023  5:12 PM      Failed - Valid encounter within last 12 months    Recent Outpatient Visits   None     Future Appointments             In 3 days Vivienne, Lonni Ingle, NP [facility]

## 2023-09-30 NOTE — Telephone Encounter (Signed)
 Requested Prescriptions  Pending Prescriptions Disp Refills   pantoprazole  (PROTONIX ) 40 MG tablet [Pharmacy Med Name: Pantoprazole  Sodium 40 MG Oral Tablet Delayed Release] 90 tablet 0    Sig: TAKE 1 TABLET BY MOUTH ONCE DAILY BEFORE BREAKFAST. STOP OMEPRAZOLE     Gastroenterology: Proton Pump Inhibitors Failed - 09/30/2023  5:11 PM      Failed - Valid encounter within last 12 months    Recent Outpatient Visits   None     Future Appointments             In 3 days Joel Lonni Ingle, NP [facility]             tiZANidine  (ZANAFLEX ) 4 MG tablet [Pharmacy Med Name: tiZANidine  HCl 4 MG Oral Tablet] 30 tablet 0    Sig: TAKE 1 TABLET BY MOUTH EVERY 6 HOURS AS NEEDED FOR MUSCLE SPASM     Not Delegated - Cardiovascular:  Alpha-2 Agonists - tizanidine  Failed - 09/30/2023  5:11 PM      Failed - This refill cannot be delegated      Failed - Valid encounter within last 6 months    Recent Outpatient Visits   None     Future Appointments             In 3 days Joel Lonni Ingle, NP [facility] at [facility]

## 2023-10-03 ENCOUNTER — Encounter: Payer: Self-pay | Admitting: Nurse Practitioner

## 2023-10-03 ENCOUNTER — Ambulatory Visit: Attending: Nurse Practitioner | Admitting: Nurse Practitioner

## 2023-10-03 VITALS — BP 120/60 | HR 87 | Ht 67.0 in | Wt 284.2 lb

## 2023-10-03 DIAGNOSIS — I428 Other cardiomyopathies: Secondary | ICD-10-CM

## 2023-10-03 DIAGNOSIS — I10 Essential (primary) hypertension: Secondary | ICD-10-CM

## 2023-10-03 DIAGNOSIS — N1832 Chronic kidney disease, stage 3b: Secondary | ICD-10-CM | POA: Diagnosis present

## 2023-10-03 DIAGNOSIS — E1121 Type 2 diabetes mellitus with diabetic nephropathy: Secondary | ICD-10-CM

## 2023-10-03 DIAGNOSIS — Z79899 Other long term (current) drug therapy: Secondary | ICD-10-CM | POA: Diagnosis not present

## 2023-10-03 DIAGNOSIS — I5032 Chronic diastolic (congestive) heart failure: Secondary | ICD-10-CM

## 2023-10-03 MED ORDER — AMLODIPINE BESYLATE 10 MG PO TABS: 10.0000 mg | ORAL_TABLET | Freq: Every day | ORAL | 3 refills | Status: AC

## 2023-10-03 MED ORDER — ROSUVASTATIN CALCIUM 40 MG PO TABS: 40.0000 mg | ORAL_TABLET | Freq: Every day | ORAL | 3 refills | Status: AC

## 2023-10-03 NOTE — Patient Instructions (Signed)
 Medication Instructions:  Your physician recommends that you continue on your current medications as directed. Please refer to the Current Medication list given to you today.   *If you need a refill on your cardiac medications before your next appointment, please call your pharmacy*  Lab Work: Your provider would like for you to have following labs drawn today CMeT and Lipids.   If you have labs (blood work) drawn today and your tests are completely normal, you will receive your results only by: MyChart Message (if you have MyChart) OR A paper copy in the mail If you have any lab test that is abnormal or we need to change your treatment, we will call you to review the results.  Follow-Up: At [facility], you and your health needs are our priority.  As part of our continuing mission to provide you with exceptional heart care, our providers are all part of one team.  This team includes your primary Cardiologist (physician) and Advanced Practice Providers or APPs (Physician Assistants and Nurse Practitioners) who all work together to provide you with the care you need, when you need it.  Your next appointment:   1 year(s)  Provider:   You may see Timothy Gollan, MD

## 2023-10-03 NOTE — Progress Notes (Signed)
 Office Visit    Patient Name: Joel [name] Date of Encounter: 10/03/2023  Primary Care Provider:  Edman Marsa PARAS, DO Primary Cardiologist:  Evalene Lunger, MD    Chief Complaint    43 y.o. 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   Past Medical History   Subjective   Past Medical History:  Diagnosis Date   Anxiety    Chest pain    a. 02/2017 MV: fixed mid ant/inferoapical defects consistent w/ attenuation. No ischemia.   Chronic diastolic CHF (congestive heart failure) (HCC)    a. 06/2014 Echo: EF 45-50%; b. 02/2017 Echo: EF 25-30%, Gr2 DD; c. 01/2019 Echo: EF 55-60%; d. 03/2019 Echo: EF 55-60%, GrI DD, no rwma, sev LVH, nl RV fxn, mld TR, mild Ao sclerosis w/o stenosis.   CKD (chronic kidney disease) stage 3, GFR 30-59 ml/min (HCC)    Constipation    Depression    GERD (gastroesophageal reflux disease)    Hyperlipidemia    Hypertensive heart disease    Lactose intolerance    NICM (nonischemic cardiomyopathy) (HCC)    a. 06/2014 Echo: EF 45-50%; b. 02/2017 Echo: EF 25-30%, Gr2 DD; c. 02/2017 MV: EF 17%, fixed mid ant/inferoapical defects consistent w/ attenuation. No ischemia; d. 01/2019 Echo: EF 55-60%; e. 03/2019 Echo: EF 55-60%, GrI DD.   Noncompliance with medications    Obesity    Palpitations    Sleep apnea    Stroke [facility])    Past Surgical History:  Procedure Laterality Date   NO PAST SURGERIES     XI ROBOTIC LAPAROSCOPIC ASSISTED APPENDECTOMY N/A 11/08/2021   Procedure: XI ROBOTIC LAPAROSCOPIC ASSISTED APPENDECTOMY;  Surgeon: Lane Shope, MD;  Location: [facility] ORS;  Service: General;  Laterality: N/A;    Allergies  Allergies  Allergen Reactions   Bidil [Isosorb  Dinitrate-Hydralazine ] Other (See Comments)    Severe headache   Nitroglycerin  Other (See Comments)    Causes severe headaches       History of Present Illness      43 y.o. 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 He was initially diagnosed with cardiomyopathy in May 2016, at which time an echo showed an EF of 45 to 50%. In January 2019, he was noted to have further drop in EF to 25 to 30%. Stress testing was undertaken and showed fixed anterior and inferoapical defects consistent with attenuation. No ischemia was noted. He was medically managed. He was subsequently noted to have improvement in EF by echo December 2020 (55-60%), with most recent echo in February 2021, performed in the setting of hospitalization for stroke (small acute infarct involving the right middle cerebellar peduncle), showing an EF of 55 to 60% with severe LVH, and grade 1 diastolic dysfunction.    Joel [name] was last seen in cardiology clinic in May 2024 at which time he was doing well.  He notes that earlier this year, his depression worsened, leading him to snack more frequently and his wt climbed from a low of 236 in 12/2022 to over 300 lbs.  His depression has been better managed over the past month or so and he  feels that he has gotten better control over his diet and his weight is back down to 284 pounds.  During his period of weight gain, he did note worsening DOE when doing yard work.  He also noted intermittent lower extremity edema, prompting him to start taking lasix  20mg . With wt loss, activity tolerance has improved significantly and he is now taking lasix  20 mg every other day.  He still occasionally noted mild ankle edema at the end of the day, but this has overall improved.  He denies chest  pain, palpitations, pnd, orthopnea, n, v, dizziness, syncope, or early satiety.   Objective   Home Medications    Current Outpatient Medications  Medication Sig Dispense Refill   Alirocumab  (PRALUENT ) 75 MG/ML SOAJ Inject 1 mL (75 mg total) into the skin every 14 (fourteen) days. INJECT 1 PEN INTO THE SKIN EVERY 14 DAYS 6 mL 3   carvedilol  (COREG ) 25 MG tablet Take 1 tablet (25 mg total) by mouth 2 (two) times daily with a meal. 180 tablet 3   clopidogrel  (PLAVIX ) 75 MG tablet Take 1 tablet by mouth once daily 90 tablet 2   ezetimibe  (ZETIA ) 10 MG tablet Take 1 tablet (10 mg total) by mouth daily. 90 tablet 3   furosemide  (LASIX ) 20 MG tablet TAKE 1 TABLET BY MOUTH ONCE DAILY AS NEEDED FOR  FLUID  OR  EDEMA 30 tablet 0   gabapentin  (NEURONTIN ) 100 MG capsule Take 1 capsule (100 mg total) by mouth at bedtime. May take 1-2 additional doses as needed every 6-8 hours. Max dose is 3 capsules in 24 hours. 90 capsule 2   OZEMPIC, 2 MG/DOSE, 8 MG/3ML SOPN Inject 2 mg into the skin once a week.     pantoprazole  (PROTONIX ) 40 MG tablet TAKE 1 TABLET BY MOUTH ONCE DAILY BEFORE BREAKFAST. STOP OMEPRAZOLE 90 tablet 0   sildenafil  (REVATIO ) 20 MG tablet Take 1-5 pills about 30 min prior to sex. Start with 1-2 and increase as needed. 90 tablet 3   tiZANidine  (ZANAFLEX ) 4 MG tablet TAKE 1 TABLET BY MOUTH EVERY 6 HOURS AS NEEDED FOR MUSCLE SPASM 30 tablet 0   TRESIBA  FLEXTOUCH 100 UNIT/ML FlexTouch Pen Inject 18 Units into the skin daily. 15 mL 1   amLODipine  (NORVASC ) 10 MG tablet Take 1 tablet (10 mg total) by mouth daily. Please call [phone] to schedule an appointment for future refills. 90 tablet 3   rosuvastatin  (CRESTOR ) 40 MG tablet Take 1 tablet (40 mg total) by mouth daily. 90 tablet 3   No current facility-administered medications for this visit.     Physical Exam    VS:  BP 120/60 (BP Location: Right Arm, Patient Position: Sitting, Cuff Size: Large)   Pulse 87   Ht 5' 7 (1.702 m)   Wt  284 lb 3.2 oz (128.9 kg)   SpO2 99%   BMI 44.51 kg/m  , BMI Body mass index is 44.51 kg/m.          GEN: Well nourished, well developed, in no acute distress. HEENT: normal. Neck: Supple, no JVD, carotid bruits, or masses. Cardiac: RRR, no murmurs, rubs, or gallops. No clubbing, cyanosis, edema.  Radials 2+/PT 2+ and equal bilaterally.  Respiratory:  Respirations regular and unlabored, clear to auscultation bilaterally. GI: Soft, nontender, nondistended, BS + x 4. MS: no deformity or atrophy. Skin: warm and dry, no rash. Neuro:  Strength and sensation are intact. Psych: Normal affect.  Accessory Clinical Findings    ECG personally  reviewed by me today - EKG Interpretation Date/Time:  Friday October 03 2023 14:34:13 EDT Ventricular Rate:  87 PR Interval:  172 QRS Duration:  110 QT Interval:  384 QTC Calculation: 462 R Axis:   -32  Text Interpretation: Normal sinus rhythm Left axis deviation Inferior infarct , age undetermined Cannot rule out Anterior infarct (cited on or before 07-Nov-2021) Confirmed by Vivienne Bruckner [phone]) on 10/03/2023 2:39:55 PM  - no acute changes.  Lab Results  Component Value Date   WBC 4.7 11/26/2021   HGB 13.5 11/26/2021   HCT 41.7 11/26/2021   MCV 84.1 11/26/2021   PLT 245 11/26/2021   Lab Results  Component Value Date   CREATININE 1.59 (H) 12/25/2021   BUN 21 12/25/2021   NA 142 12/25/2021   K 3.7 12/25/2021   CL 105 12/25/2021   CO2 27 12/25/2021   Lab Results  Component Value Date   ALT 17 11/26/2021   AST 15 11/26/2021   ALKPHOS 68 11/07/2021   BILITOT 0.4 11/26/2021   Lab Results  Component Value Date   CHOL 141 09/19/2021   HDL 28 (L) 09/19/2021   LDLCALC 87 09/19/2021   TRIG 145 09/19/2021   CHOLHDL 5.0 09/19/2021    Lab Results  Component Value Date   HGBA1C 5.7 (H) 11/07/2021   Lab Results  Component Value Date   TSH 1.980 07/25/2020       Assessment & Plan    1.  Chronic heart failure with improved  ejection fraction/nonischemic cardiomyopathy: EF previously as low as 25-30% in 2019 stress testing at that time showing no evidence of ischemia.  Most recent echo in February 2021 showed an EF of 55-60% with grade 1 diastolic dysfunction and severe LVH.  Over the past year, he's had some fluctuation in wt associated w/ worsening and now improving depression.  With wt gain, he noted an increase in dependent edema, prompting him to start taking Lasix  20 mg daily.  Over the past month his depression has been better controlled and he has reduced his caloric intake with associated weight loss.  In that setting, he has noted improved activity tolerance and also reduced swelling.  He is currently using Lasix  20 mg every other day and is euvolemic on examination today.  Continue carvedilol .  Historically not on ACEI/ARB/ARNI/MRA 2/2 CKD.  F/u CMET today.  2.  Primary hypertension:  stable on ? blocker, calcium  channel blocker, and diuretic rx.  3.  Hyperlipidemia: On rosuvastatin , Zetia , and Praluent .  No lipids since 2023.  Following up lipids and LFTs today.  4.  Type 2 diabetes mellitus: A1c was 5.8, when last checked in May 2025.  Followed by endocrinology.  5.  Stage III-IV chronic kidney disease: Follow-up complete metabolic panel today.  6.  Morbid obesity: Fluctuating weight in the setting of depression and dietary indiscretions.  Feeling better over the past month and weight back down to about where he was last year.  He is on Ozempic as well though does not think this plays a major role in his weight.  7.  Disposition: Follow-up complete metabolic panel and lipids.  Follow-up in clinic in 1 year or sooner if necessary.  Bruckner Vivienne, NP 10/03/2023, 4:28 PM

## 2023-10-04 LAB — COMPREHENSIVE METABOLIC PANEL WITH GFR
ALT: 27 IU/L (ref 0–44)
AST: 26 IU/L (ref 0–40)
Albumin: 4.2 g/dL (ref 4.1–5.1)
Alkaline Phosphatase: 75 IU/L (ref 44–121)
BUN/Creatinine Ratio: 15 (ref 9–20)
BUN: 26 mg/dL — ABNORMAL HIGH (ref 6–24)
Bilirubin Total: 0.6 mg/dL (ref 0.0–1.2)
CO2: 23 mmol/L (ref 20–29)
Calcium: 8.9 mg/dL (ref 8.7–10.2)
Chloride: 103 mmol/L (ref 96–106)
Creatinine, Ser: 1.79 mg/dL — ABNORMAL HIGH (ref 0.76–1.27)
Globulin, Total: 2.8 g/dL (ref 1.5–4.5)
Glucose: 82 mg/dL (ref 70–99)
Potassium: 3.7 mmol/L (ref 3.5–5.2)
Sodium: 140 mmol/L (ref 134–144)
Total Protein: 7 g/dL (ref 6.0–8.5)
eGFR: 48 mL/min/1.73 — ABNORMAL LOW (ref >59)

## 2023-10-04 LAB — LIPID PANEL
Chol/HDL Ratio: 4 ratio (ref 0.0–5.0)
Cholesterol, Total: 193 mg/dL (ref 100–199)
HDL: 48 mg/dL (ref >39)
LDL Chol Calc (NIH): 127 mg/dL — ABNORMAL HIGH (ref 0–99)
Triglycerides: 97 mg/dL (ref 0–149)
VLDL Cholesterol Cal: 18 mg/dL (ref 5–40)

## 2023-10-06 ENCOUNTER — Ambulatory Visit: Payer: Self-pay | Admitting: Nurse Practitioner

## 2023-10-17 ENCOUNTER — Ambulatory Visit: Payer: Medicare Other

## 2023-10-17 DIAGNOSIS — Z0000 Encounter for general adult medical examination without abnormal findings: Secondary | ICD-10-CM

## 2023-10-17 NOTE — Patient Instructions (Addendum)
 [name],  Thank you for taking the time for your Medicare Wellness Visit. I appreciate your continued commitment to your health goals. Please review the care plan we discussed, and feel free to reach out if I can assist you further.  Medicare recommends these wellness visits once per year to help you and your care team stay ahead of potential health issues. These visits are designed to focus on prevention, allowing your provider to concentrate on managing your acute and chronic conditions during your regular appointments.  Please note that Annual Wellness Visits do not include a physical exam. Some assessments may be limited, especially if the visit was conducted virtually. If needed, we may recommend a separate in-person follow-up with your provider.  Ongoing Care Seeing your primary care provider every 3 to 6 months helps us  monitor your health and provide consistent, personalized care.   Referrals If a referral was made during today's visit and you haven't received any updates within two weeks, please contact the referred provider directly to check on the status.  Recommended Screenings:  Health Maintenance  Topic Date Due   COVID-19 Vaccine (1) Never done   Yearly kidney health urinalysis for diabetes  Never done   Hepatitis C Screening  Never done   Hepatitis B Vaccine (1 of 3 - 19+ 3-dose series) Never done   HPV Vaccine (1 - 3-dose SCDM series) Never done   Pneumococcal Vaccine (3 of 3 - PCV) 02/14/2018   Complete foot exam   12/20/2018   Eye exam for diabetics  06/09/2021   Hemoglobin A1C  05/08/2022   Flu Shot  09/12/2023   DTaP/Tdap/Td vaccine (2 - Td or Tdap) 06/13/2024   Yearly kidney function blood test for diabetes  10/02/2024   Medicare Annual Wellness Visit  10/16/2024   HIV Screening  Completed   Meningitis B Vaccine  Aged Out       10/17/2023    2:50 PM  Advanced Directives  Does Patient Have a Medical Advance Directive? No  Would patient like information on  creating a medical advance directive? No - Patient declined   Advance Care Planning is important because it: Ensures you receive medical care that aligns with your values, goals, and preferences. Provides guidance to your family and loved ones, reducing the emotional burden of decision-making during critical moments.  Vision: Annual vision screenings are recommended for early detection of glaucoma, cataracts, and diabetic retinopathy. These exams can also reveal signs of chronic conditions such as diabetes and high blood pressure.  Dental: Annual dental screenings help detect early signs of oral cancer, gum disease, and other conditions linked to overall health, including heart disease and diabetes.  Please see the attached documents for additional preventive care recommendations.   NEXT AWV 11/05/24 @ 2:40 PM BY PHONE

## 2023-10-17 NOTE — Progress Notes (Signed)
 Subjective:   Joel [name] is a 43 y.o. who presents for a Medicare Wellness preventive visit.  As a reminder, Annual Wellness Visits don't include a physical exam, and some assessments may be limited, especially if this visit is performed virtually. We may recommend an in-person follow-up visit with your provider if needed.  Visit Complete: Virtual I connected with  Joel [name] on 10/17/23 by a audio enabled telemedicine application and verified that I am speaking with the correct person using two identifiers.  Patient Location: Home  Provider Location: Home Office  I discussed the limitations of evaluation and management by telemedicine. The patient expressed understanding and agreed to proceed.  Vital Signs: Because this visit was a virtual/telehealth visit, some criteria may be missing or patient reported. Any vitals not documented were not able to be obtained and vitals that have been documented are patient reported.  VideoDeclined- This patient declined Librarian, academic. Therefore the visit was completed with audio only.  Persons Participating in Visit: Patient.  AWV Questionnaire: No: Patient Medicare AWV questionnaire was not completed prior to this visit.  Cardiac Risk Factors include: advanced age (>9men, >61 women);male gender;diabetes mellitus;hypertension;obesity (BMI >30kg/m2);sedentary lifestyle     Objective:    There were no vitals filed for this visit. There is no height or weight on file to calculate BMI.     10/17/2023    2:50 PM 10/11/2022    3:34 PM 11/08/2021    4:04 PM 11/07/2021   11:00 PM 11/07/2021    3:42 PM 10/03/2020    2:44 PM 03/19/2019    9:16 PM  Advanced Directives  Does Patient Have a Medical Advance Directive? No No  No No No   Would patient like information on creating a medical advance directive? No - Patient declined No - Patient declined No - Patient declined No - Patient declined   No - Patient declined     Current Medications (verified) Outpatient Encounter Medications as of 10/17/2023  Medication Sig   Alirocumab  (PRALUENT ) 75 MG/ML SOAJ Inject 1 mL (75 mg total) into the skin every 14 (fourteen) days. INJECT 1 PEN INTO THE SKIN EVERY 14 DAYS   amLODipine  (NORVASC ) 10 MG tablet Take 1 tablet (10 mg total) by mouth daily. Please call [phone] to schedule an appointment for future refills.   carvedilol  (COREG ) 25 MG tablet Take 1 tablet (25 mg total) by mouth 2 (two) times daily with a meal.   clopidogrel  (PLAVIX ) 75 MG tablet Take 1 tablet by mouth once daily   ezetimibe  (ZETIA ) 10 MG tablet Take 1 tablet (10 mg total) by mouth daily.   furosemide  (LASIX ) 20 MG tablet TAKE 1 TABLET BY MOUTH ONCE DAILY AS NEEDED FOR  FLUID  OR  EDEMA   gabapentin  (NEURONTIN ) 100 MG capsule Take 1 capsule (100 mg total) by mouth at bedtime. May take 1-2 additional doses as needed every 6-8 hours. Max dose is 3 capsules in 24 hours.   OZEMPIC, 2 MG/DOSE, 8 MG/3ML SOPN Inject 2 mg into the skin once a week.   pantoprazole  (PROTONIX ) 40 MG tablet TAKE 1 TABLET BY MOUTH ONCE DAILY BEFORE BREAKFAST. STOP OMEPRAZOLE   rosuvastatin  (CRESTOR ) 40 MG tablet Take 1 tablet (40 mg total) by mouth daily.   tiZANidine  (ZANAFLEX ) 4 MG tablet TAKE 1 TABLET BY MOUTH EVERY 6 HOURS AS NEEDED FOR MUSCLE SPASM   TRESIBA  FLEXTOUCH 100 UNIT/ML FlexTouch Pen Inject 18 Units into the skin daily.   sildenafil  (  REVATIO ) 20 MG tablet Take 1-5 pills about 30 min prior to sex. Start with 1-2 and increase as needed. (Patient not taking: Reported on 10/17/2023)   No facility-administered encounter medications on file as of 10/17/2023.    Allergies (verified) Bidil [isosorb dinitrate-hydralazine ] and Nitroglycerin    History: Past Medical History:  Diagnosis Date   Anxiety    Chest pain    a. 02/2017 MV: fixed mid ant/inferoapical defects consistent w/ attenuation. No ischemia.   Chronic diastolic CHF (congestive heart failure) (HCC)    a.  06/2014 Echo: EF 45-50%; b. 02/2017 Echo: EF 25-30%, Gr2 DD; c. 01/2019 Echo: EF 55-60%; d. 03/2019 Echo: EF 55-60%, GrI DD, no rwma, sev LVH, nl RV fxn, mld TR, mild Ao sclerosis w/o stenosis.   CKD (chronic kidney disease) stage 3, GFR 30-59 ml/min (HCC)    Constipation    Depression    GERD (gastroesophageal reflux disease)    Hyperlipidemia    Hypertensive heart disease    Lactose intolerance    NICM (nonischemic cardiomyopathy) (HCC)    a. 06/2014 Echo: EF 45-50%; b. 02/2017 Echo: EF 25-30%, Gr2 DD; c. 02/2017 MV: EF 17%, fixed mid ant/inferoapical defects consistent w/ attenuation. No ischemia; d. 01/2019 Echo: EF 55-60%; e. 03/2019 Echo: EF 55-60%, GrI DD.   Noncompliance with medications    Obesity    Palpitations    Sleep apnea    Stroke [facility])    Past Surgical History:  Procedure Laterality Date   NO PAST SURGERIES     XI ROBOTIC LAPAROSCOPIC ASSISTED APPENDECTOMY N/A 11/08/2021   Procedure: XI ROBOTIC LAPAROSCOPIC ASSISTED APPENDECTOMY;  Surgeon: Lane Shope, MD;  Location: [facility] ORS;  Service: General;  Laterality: N/A;   Family History  Problem Relation Age of Onset   Cancer Mother    Hypertension Mother    Alcoholism Father    Hyperlipidemia Father    Diabetes Father    CAD Father        a. stenting in his early 77s   Heart disease Father    Stroke Father    Heart attack Father    Diabetes Maternal Grandmother    Diabetes Maternal Grandfather    CAD Paternal Grandmother    CAD Paternal Grandfather    Colon cancer Paternal Grandfather    Diabetes Paternal Uncle    Social History   Socioeconomic History   Marital status: Married    Spouse name: Joel [name]   Number of children: 1   Years of education: 12   Highest education level: 12th grade  Occupational History   Occupation: Disabled  Tobacco Use   Smoking status: Never   Smokeless tobacco: Never  Vaping Use   Vaping status: Every Day   Substances: Nicotine, Flavoring  Substance and Sexual Activity    Alcohol use: Not Currently    Comment: Very infrequently; ~2-3 drinks/year    Drug use: No   Sexual activity: Not Currently    Birth control/protection: None  Other Topics Concern   Not on file  Social History Narrative   Not on file   Social Drivers of Health   Financial Resource Strain: Low Risk  (10/17/2023)   Overall Financial Resource Strain (CARDIA)    Difficulty of Paying Living Expenses: Not hard at all  Food Insecurity: No Food Insecurity (10/17/2023)   Hunger Vital Sign    Worried About Running Out of Food in the Last Year: Never true    Ran Out of Food in the Last Year: Never  true  Transportation Needs: No Transportation Needs (10/17/2023)   PRAPARE - Administrator, Civil Service (Medical): No    Lack of Transportation (Non-Medical): No  Physical Activity: Inactive (10/17/2023)   Exercise Vital Sign    Days of Exercise per Week: 0 days    Minutes of Exercise per Session: 0 min  Stress: No Stress Concern Present (10/17/2023)   [employer] of Occupational Health - Occupational Stress Questionnaire    Feeling of Stress: Not at all  Social Connections: Moderately Isolated (10/17/2023)   Social Connection and Isolation Panel    Frequency of Communication with Friends and Family: More than three times a week    Frequency of Social Gatherings with Friends and Family: Once a week    Attends Religious Services: 1 to 4 times per year    Active Member of [employer] or Organizations: No    Attends Banker Meetings: Never    Marital Status: Separated    Tobacco Counseling Counseling given: Not Answered    Clinical Intake:  Pre-visit preparation completed: Yes  Pain : No/denies pain     BMI - recorded: 44.5 Nutritional Status: BMI > 30  Obese Nutritional Risks: None Diabetes: Yes CBG done?: No Did pt. bring in CBG monitor from home?: No  Lab Results  Component Value Date   HGBA1C 5.7 (H) 11/07/2021   HGBA1C 5.3 05/11/2020   HGBA1C 7.0 (H)  03/20/2019     How often do you need to have someone help you when you read instructions, pamphlets, or other written materials from your doctor or pharmacy?: 1 - Never  Interpreter Needed?: No  Information entered by :: JHONNIE DAS, LPN   Activities of Daily Living     10/17/2023    2:51 PM 11/14/2022    8:50 AM  In your present state of health, do you have any difficulty performing the following activities:  Hearing? 1 1  Vision? 0 0  Difficulty concentrating or making decisions? 1 1  Comment MEMORY ISSUES S/P  STROKE   Walking or climbing stairs? 0 0  Dressing or bathing? 0 0  Doing errands, shopping? 0 1  Preparing Food and eating ? N   Using the Toilet? N   In the past six months, have you accidently leaked urine? N   Do you have problems with loss of bowel control? N   Managing your Medications? Y   Managing your Finances? Y   Housekeeping or managing your Housekeeping? Y     Patient Care Team: Edman Marsa PARAS, DO as PCP - General (Family Medicine) Perla Evalene PARAS, MD as PCP - Cardiology (Cardiology) Damian Therisa HERO, MD as Physician Assistant (Endocrinology) [facility]  I have updated your Care Teams any recent Medical Services you may have received from other providers in the past year.     Assessment:   This is a routine wellness examination for Joel [name].  Hearing/Vision screen Hearing Screening - Comments:: WEARS AID FOR RIGHT EAR Vision Screening - Comments:: WEARS GLASSES ALL DAY- [facility]   Goals Addressed             This Visit's Progress    DIET - INCREASE WATER INTAKE         Depression Screen     10/17/2023    2:48 PM 11/14/2022    8:48 AM 10/11/2022    3:32 PM 12/19/2021    3:58 PM 11/26/2021    2:53 PM 10/09/2021  3:08 PM 08/02/2021   10:34 AM  PHQ 2/9 Scores  PHQ - 2 Score 2 5 6  0 4 4 5   PHQ- 9 Score 3 21 8  18 20 17     Fall Risk     10/17/2023    2:51 PM 11/14/2022    8:50 AM 10/11/2022    3:34 PM 12/19/2021     3:57 PM 11/26/2021    2:53 PM  Fall Risk   Falls in the past year? 0 0 0 0 1  Number falls in past yr: 0  0 0 0  Injury with Fall? 0  0 0 1  Risk for fall due to : No Fall Risks  No Fall Risks No Fall Risks Impaired balance/gait  Follow up Falls evaluation completed;Falls prevention discussed  Falls prevention discussed;Falls evaluation completed Falls evaluation completed  Falls evaluation completed      Data saved with a previous flowsheet row definition    MEDICARE RISK AT HOME:  Medicare Risk at Home Any stairs in or around the home?: Yes If so, are there any without handrails?: Yes Home free of loose throw rugs in walkways, pet beds, electrical cords, etc?: Yes Adequate lighting in your home to reduce risk of falls?: Yes Life alert?: No Use of a cane, walker or w/c?: No Grab bars in the bathroom?: Yes Shower chair or bench in shower?: No Elevated toilet seat or a handicapped toilet?: No  TIMED UP AND GO:  Was the test performed?  No  Cognitive Function: 6CIT completed        10/17/2023    2:53 PM 10/11/2022    3:35 PM 10/09/2021    3:17 PM  6CIT Screen  What Year? 0 points 0 points 0 points  What month? 0 points 0 points 0 points  What time? 0 points 0 points 0 points  Count back from 20 0 points 0 points 0 points  Months in reverse 4 points 0 points 0 points  Repeat phrase 4 points 4 points 8 points  Total Score 8 points 4 points 8 points    Immunizations Immunization History  Administered Date(s) Administered   Influenza,inj,Quad PF,6+ Mos 02/14/2017   Pneumococcal Polysaccharide-23 06/14/2014, 02/14/2017   Tdap 06/14/2014    Screening Tests Health Maintenance  Topic Date Due   COVID-19 Vaccine (1) Never done   Diabetic kidney evaluation - Urine ACR  Never done   Hepatitis C Screening  Never done   Hepatitis B Vaccines 19-59 Average Risk (1 of 3 - 19+ 3-dose series) Never done   HPV VACCINES (1 - 3-dose SCDM series) Never done   Pneumococcal Vaccine  (3 of 3 - PCV) 02/14/2018   FOOT EXAM  12/20/2018   OPHTHALMOLOGY EXAM  06/09/2021   HEMOGLOBIN A1C  05/08/2022   Influenza Vaccine  09/12/2023   DTaP/Tdap/Td (2 - Td or Tdap) 06/13/2024   Diabetic kidney evaluation - eGFR measurement  10/02/2024   Medicare Annual Wellness (AWV)  10/16/2024   HIV Screening  Completed   Meningococcal B Vaccine  Aged Out    Health Maintenance Items Addressed: NEEDS PNA, UP TO DATE TDAP;  DOESN'T WANT COVID SHOTS  Additional Screening:  Vision Screening: Recommended annual ophthalmology exams for early detection of glaucoma and other disorders of the eye. Is the patient up to date with their annual eye exam?  No  Who is the provider or what is the name of the office in which the patient attends annual eye exams? [facility]  Dental Screening: Recommended annual dental exams for proper oral hygiene  Community Resource Referral / Chronic Care Management: CRR required this visit?  No   CCM required this visit?  No   Plan:    I have personally reviewed and noted the following in the patient's chart:   Medical and social history Use of alcohol, tobacco or illicit drugs  Current medications and supplements including opioid prescriptions. Patient is not currently taking opioid prescriptions. Functional ability and status Nutritional status Physical activity Advanced directives List of other physicians Hospitalizations, surgeries, and ER visits in previous 12 months Vitals Screenings to include cognitive, depression, and falls Referrals and appointments  In addition, I have reviewed and discussed with patient certain preventive protocols, quality metrics, and best practice recommendations. A written personalized care plan for preventive services as well as general preventive health recommendations were provided to patient.   Jhonnie GORMAN Das, LPN   [id number]   After Visit Summary: (MyChart) Due to this being a telephonic visit, the after visit  summary with patients personalized plan was offered to patient via MyChart   Notes: Nothing significant to report at this time.

## 2023-10-27 ENCOUNTER — Telehealth: Payer: Self-pay | Admitting: Pharmacy Technician

## 2023-10-27 ENCOUNTER — Other Ambulatory Visit (HOSPITAL_COMMUNITY): Payer: Self-pay

## 2023-10-27 NOTE — Telephone Encounter (Signed)
 SABRA

## 2023-12-22 ENCOUNTER — Ambulatory Visit: Payer: Managed Care, Other (non HMO) | Admitting: Internal Medicine

## 2023-12-31 ENCOUNTER — Other Ambulatory Visit: Payer: Self-pay | Admitting: Family Medicine

## 2023-12-31 DIAGNOSIS — K219 Gastro-esophageal reflux disease without esophagitis: Secondary | ICD-10-CM

## 2024-01-02 NOTE — Telephone Encounter (Signed)
 Requested Prescriptions  Pending Prescriptions Disp Refills   pantoprazole  (PROTONIX ) 40 MG tablet [Pharmacy Med Name: PANTOPRAZOLE  SOD DR 40 MG TAB] 30 tablet 1    Sig: TAKE 1 TABLET BY MOUTH EVERY DAY BEFORE BREAKFAST. STOP OMEPRAZOLE     Gastroenterology: Proton Pump Inhibitors Passed - 01/02/2024 12:03 PM      Passed - Valid encounter within last 12 months    Recent Outpatient Visits   None

## 2024-01-13 ENCOUNTER — Other Ambulatory Visit: Payer: Self-pay | Admitting: Cardiovascular Disease

## 2024-01-31 ENCOUNTER — Other Ambulatory Visit: Payer: Self-pay | Admitting: Cardiovascular Disease

## 2024-02-08 ENCOUNTER — Other Ambulatory Visit: Payer: Self-pay | Admitting: Cardiovascular Disease

## 2024-02-21 ENCOUNTER — Other Ambulatory Visit: Payer: Self-pay | Admitting: Cardiovascular Disease

## 2024-03-07 ENCOUNTER — Other Ambulatory Visit: Payer: Self-pay | Admitting: Family Medicine

## 2024-03-07 DIAGNOSIS — K219 Gastro-esophageal reflux disease without esophagitis: Secondary | ICD-10-CM

## 2024-03-08 NOTE — Telephone Encounter (Signed)
 Requested Prescriptions  Pending Prescriptions Disp Refills   pantoprazole  (PROTONIX ) 40 MG tablet [Pharmacy Med Name: PANTOPRAZOLE  SOD DR 40 MG TAB] 90 tablet 0    Sig: TAKE 1 TABLET BY MOUTH EVERY DAY BEFORE BREAKFAST. STOP OMEPRAZOLE     Gastroenterology: Proton Pump Inhibitors Passed - 03/08/2024  3:33 PM      Passed - Valid encounter within last 12 months    Recent Outpatient Visits   None

## 2024-11-05 ENCOUNTER — Ambulatory Visit
